# Patient Record
Sex: Female | Born: 1935
Health system: Southern US, Community
[De-identification: ages and names within clinical notes are randomized; demographics above are authoritative.]

## PROBLEM LIST (undated history)

## (undated) DIAGNOSIS — R011 Cardiac murmur, unspecified: Secondary | ICD-10-CM

## (undated) DIAGNOSIS — I714 Abdominal aortic aneurysm, without rupture, unspecified: Secondary | ICD-10-CM

## (undated) DIAGNOSIS — H6980 Other specified disorders of Eustachian tube, unspecified ear: Secondary | ICD-10-CM

## (undated) DIAGNOSIS — Z860100 Personal history of colon polyps, unspecified: Secondary | ICD-10-CM

## (undated) DIAGNOSIS — I712 Thoracic aortic aneurysm, without rupture: Secondary | ICD-10-CM

## (undated) DIAGNOSIS — I482 Chronic atrial fibrillation, unspecified: Secondary | ICD-10-CM

## (undated) DIAGNOSIS — Z5189 Encounter for other specified aftercare: Secondary | ICD-10-CM

## (undated) DIAGNOSIS — K449 Diaphragmatic hernia without obstruction or gangrene: Secondary | ICD-10-CM

## (undated) DIAGNOSIS — K219 Gastro-esophageal reflux disease without esophagitis: Secondary | ICD-10-CM

## (undated) DIAGNOSIS — J449 Chronic obstructive pulmonary disease, unspecified: Secondary | ICD-10-CM

## (undated) DIAGNOSIS — I351 Nonrheumatic aortic (valve) insufficiency: Secondary | ICD-10-CM

## (undated) DIAGNOSIS — I7121 Aneurysm of the ascending aorta, without rupture: Secondary | ICD-10-CM

## (undated) DIAGNOSIS — I729 Aneurysm of unspecified site: Secondary | ICD-10-CM

## (undated) DIAGNOSIS — IMO0001 Reserved for inherently not codable concepts without codable children: Secondary | ICD-10-CM

## (undated) DIAGNOSIS — I071 Rheumatic tricuspid insufficiency: Secondary | ICD-10-CM

## (undated) DIAGNOSIS — I5032 Chronic diastolic (congestive) heart failure: Secondary | ICD-10-CM

## (undated) DIAGNOSIS — Z950 Presence of cardiac pacemaker: Secondary | ICD-10-CM

## (undated) DIAGNOSIS — I7101 Dissection of thoracic aorta: Secondary | ICD-10-CM

## (undated) DIAGNOSIS — I442 Atrioventricular block, complete: Secondary | ICD-10-CM

## (undated) DIAGNOSIS — I350 Nonrheumatic aortic (valve) stenosis: Secondary | ICD-10-CM

## (undated) DIAGNOSIS — E785 Hyperlipidemia, unspecified: Secondary | ICD-10-CM

## (undated) DIAGNOSIS — I10 Essential (primary) hypertension: Secondary | ICD-10-CM

## (undated) DIAGNOSIS — S069X9A Unspecified intracranial injury with loss of consciousness of unspecified duration, initial encounter: Secondary | ICD-10-CM

## (undated) DIAGNOSIS — I4819 Other persistent atrial fibrillation: Secondary | ICD-10-CM

## (undated) DIAGNOSIS — I314 Cardiac tamponade: Secondary | ICD-10-CM

## (undated) DIAGNOSIS — C801 Malignant (primary) neoplasm, unspecified: Secondary | ICD-10-CM

## (undated) DIAGNOSIS — K5730 Diverticulosis of large intestine without perforation or abscess without bleeding: Secondary | ICD-10-CM

## (undated) DIAGNOSIS — I773 Arterial fibromuscular dysplasia: Secondary | ICD-10-CM

## (undated) DIAGNOSIS — H6990 Unspecified Eustachian tube disorder, unspecified ear: Secondary | ICD-10-CM

## (undated) DIAGNOSIS — I495 Sick sinus syndrome: Secondary | ICD-10-CM

## (undated) DIAGNOSIS — M1990 Unspecified osteoarthritis, unspecified site: Secondary | ICD-10-CM

## (undated) HISTORY — DX: Arterial fibromuscular dysplasia: I77.3

## (undated) HISTORY — PX: EYE SURGERY: SHX253

## (undated) HISTORY — DX: Essential (primary) hypertension: I10

## (undated) HISTORY — DX: Other specified disorders of Eustachian tube, unspecified ear: H69.80

## (undated) HISTORY — DX: Diverticulosis of large intestine without perforation or abscess without bleeding: K57.30

## (undated) HISTORY — DX: Personal history of colonic polyps: Z86.010

## (undated) HISTORY — DX: Other persistent atrial fibrillation: I48.19

## (undated) HISTORY — DX: Unspecified eustachian tube disorder, unspecified ear: H69.90

## (undated) HISTORY — PX: HEMORROIDECTOMY: SUR656

## (undated) HISTORY — PX: CHOLECYSTECTOMY: SHX55

## (undated) HISTORY — DX: Hyperlipidemia, unspecified: E78.5

## (undated) HISTORY — PX: CARDIAC CATHETERIZATION: SHX172

## (undated) HISTORY — PX: KNEE ARTHROPLASTY: SHX992

## (undated) HISTORY — PX: FRACTURE SURGERY: SHX138

## (undated) HISTORY — PX: CARDIAC ELECTROPHYSIOLOGY MAPPING AND ABLATION: SHX1292

## (undated) HISTORY — PX: CATARACT EXTRACTION W/ INTRAOCULAR LENS  IMPLANT, BILATERAL: SHX1307

## (undated) HISTORY — DX: Aneurysm of unspecified site: I72.9

## (undated) HISTORY — PX: TONSILLECTOMY: SUR1361

## (undated) HISTORY — DX: Personal history of colon polyps, unspecified: Z86.0100

## (undated) HISTORY — DX: Chronic obstructive pulmonary disease, unspecified: J44.9

## (undated) HISTORY — DX: Atrioventricular block, complete: I44.2

---

## 1985-05-13 DIAGNOSIS — S069X9A Unspecified intracranial injury with loss of consciousness of unspecified duration, initial encounter: Secondary | ICD-10-CM

## 1985-05-13 HISTORY — DX: Unspecified intracranial injury with loss of consciousness of unspecified duration, initial encounter: S06.9X9A

## 1996-05-13 HISTORY — PX: EXTERNAL FIXATION WRIST FRACTURE: SHX1553

## 1998-04-18 ENCOUNTER — Other Ambulatory Visit: Admission: RE | Admit: 1998-04-18 | Discharge: 1998-04-18 | Payer: Self-pay | Admitting: Obstetrics and Gynecology

## 1998-12-13 ENCOUNTER — Encounter: Admission: RE | Admit: 1998-12-13 | Discharge: 1999-01-31 | Payer: Self-pay | Admitting: Family Medicine

## 1999-01-17 ENCOUNTER — Encounter: Payer: Self-pay | Admitting: Emergency Medicine

## 1999-01-17 ENCOUNTER — Inpatient Hospital Stay (HOSPITAL_COMMUNITY): Admission: EM | Admit: 1999-01-17 | Discharge: 1999-01-19 | Payer: Self-pay | Admitting: Emergency Medicine

## 1999-01-19 ENCOUNTER — Encounter: Payer: Self-pay | Admitting: Cardiology

## 1999-06-13 ENCOUNTER — Encounter: Payer: Self-pay | Admitting: Emergency Medicine

## 1999-06-13 ENCOUNTER — Inpatient Hospital Stay (HOSPITAL_COMMUNITY): Admission: EM | Admit: 1999-06-13 | Discharge: 1999-06-15 | Payer: Self-pay | Admitting: Emergency Medicine

## 1999-10-23 ENCOUNTER — Other Ambulatory Visit: Admission: RE | Admit: 1999-10-23 | Discharge: 1999-10-23 | Payer: Self-pay | Admitting: *Deleted

## 1999-11-04 ENCOUNTER — Encounter: Payer: Self-pay | Admitting: Emergency Medicine

## 1999-11-04 ENCOUNTER — Emergency Department (HOSPITAL_COMMUNITY): Admission: EM | Admit: 1999-11-04 | Discharge: 1999-11-04 | Payer: Self-pay | Admitting: Emergency Medicine

## 1999-11-16 ENCOUNTER — Ambulatory Visit (HOSPITAL_COMMUNITY): Admission: RE | Admit: 1999-11-16 | Discharge: 1999-11-16 | Payer: Self-pay | Admitting: Cardiovascular Disease

## 2000-03-02 ENCOUNTER — Inpatient Hospital Stay (HOSPITAL_COMMUNITY): Admission: EM | Admit: 2000-03-02 | Discharge: 2000-03-04 | Payer: Self-pay | Admitting: Emergency Medicine

## 2000-03-02 ENCOUNTER — Encounter: Payer: Self-pay | Admitting: Cardiovascular Disease

## 2000-06-09 ENCOUNTER — Inpatient Hospital Stay (HOSPITAL_COMMUNITY): Admission: AD | Admit: 2000-06-09 | Discharge: 2000-06-16 | Payer: Self-pay | Admitting: Cardiovascular Disease

## 2000-11-11 ENCOUNTER — Other Ambulatory Visit: Admission: RE | Admit: 2000-11-11 | Discharge: 2000-11-11 | Payer: Self-pay | Admitting: *Deleted

## 2000-11-20 ENCOUNTER — Other Ambulatory Visit: Admission: RE | Admit: 2000-11-20 | Discharge: 2000-11-20 | Payer: Self-pay | Admitting: *Deleted

## 2000-11-20 ENCOUNTER — Encounter (INDEPENDENT_AMBULATORY_CARE_PROVIDER_SITE_OTHER): Payer: Self-pay | Admitting: *Deleted

## 2002-08-12 ENCOUNTER — Encounter: Payer: Self-pay | Admitting: Thoracic Surgery

## 2002-08-12 ENCOUNTER — Encounter: Admission: RE | Admit: 2002-08-12 | Discharge: 2002-08-12 | Payer: Self-pay | Admitting: Thoracic Surgery

## 2002-08-19 ENCOUNTER — Encounter: Payer: Self-pay | Admitting: Thoracic Surgery

## 2002-08-19 ENCOUNTER — Encounter: Admission: RE | Admit: 2002-08-19 | Discharge: 2002-08-19 | Payer: Self-pay | Admitting: Thoracic Surgery

## 2003-03-31 ENCOUNTER — Inpatient Hospital Stay (HOSPITAL_COMMUNITY): Admission: EM | Admit: 2003-03-31 | Discharge: 2003-04-06 | Payer: Self-pay | Admitting: Emergency Medicine

## 2004-01-27 ENCOUNTER — Ambulatory Visit (HOSPITAL_COMMUNITY): Admission: RE | Admit: 2004-01-27 | Discharge: 2004-01-27 | Payer: Self-pay | Admitting: Gastroenterology

## 2004-05-16 ENCOUNTER — Ambulatory Visit: Payer: Self-pay | Admitting: Internal Medicine

## 2004-05-23 ENCOUNTER — Ambulatory Visit: Payer: Self-pay | Admitting: Internal Medicine

## 2004-05-30 ENCOUNTER — Ambulatory Visit: Payer: Self-pay | Admitting: Internal Medicine

## 2004-06-07 ENCOUNTER — Ambulatory Visit: Payer: Self-pay | Admitting: Internal Medicine

## 2004-06-14 ENCOUNTER — Ambulatory Visit: Payer: Self-pay | Admitting: Internal Medicine

## 2004-06-21 ENCOUNTER — Ambulatory Visit: Payer: Self-pay | Admitting: Internal Medicine

## 2004-06-27 ENCOUNTER — Ambulatory Visit: Payer: Self-pay | Admitting: Internal Medicine

## 2004-07-04 ENCOUNTER — Ambulatory Visit: Payer: Self-pay | Admitting: Internal Medicine

## 2004-07-11 ENCOUNTER — Ambulatory Visit: Payer: Self-pay | Admitting: Internal Medicine

## 2004-07-18 ENCOUNTER — Ambulatory Visit: Payer: Self-pay | Admitting: Internal Medicine

## 2004-07-26 ENCOUNTER — Ambulatory Visit: Payer: Self-pay | Admitting: Internal Medicine

## 2004-08-02 ENCOUNTER — Ambulatory Visit: Payer: Self-pay | Admitting: Internal Medicine

## 2004-08-08 ENCOUNTER — Ambulatory Visit: Payer: Self-pay | Admitting: Internal Medicine

## 2004-08-12 ENCOUNTER — Emergency Department (HOSPITAL_COMMUNITY): Admission: EM | Admit: 2004-08-12 | Discharge: 2004-08-12 | Payer: Self-pay | Admitting: Emergency Medicine

## 2004-08-15 ENCOUNTER — Ambulatory Visit: Payer: Self-pay | Admitting: Internal Medicine

## 2004-08-23 ENCOUNTER — Ambulatory Visit: Payer: Self-pay | Admitting: Internal Medicine

## 2004-08-29 ENCOUNTER — Ambulatory Visit: Payer: Self-pay | Admitting: Internal Medicine

## 2004-09-11 ENCOUNTER — Ambulatory Visit: Payer: Self-pay | Admitting: Internal Medicine

## 2004-09-20 ENCOUNTER — Ambulatory Visit: Payer: Self-pay | Admitting: Internal Medicine

## 2004-09-26 ENCOUNTER — Ambulatory Visit: Payer: Self-pay | Admitting: Internal Medicine

## 2004-10-03 ENCOUNTER — Ambulatory Visit: Payer: Self-pay | Admitting: Internal Medicine

## 2004-10-12 ENCOUNTER — Ambulatory Visit: Payer: Self-pay | Admitting: Internal Medicine

## 2004-10-17 ENCOUNTER — Ambulatory Visit: Payer: Self-pay | Admitting: Internal Medicine

## 2004-10-23 ENCOUNTER — Ambulatory Visit: Payer: Self-pay | Admitting: Internal Medicine

## 2004-11-01 ENCOUNTER — Ambulatory Visit: Payer: Self-pay | Admitting: Internal Medicine

## 2004-11-08 ENCOUNTER — Ambulatory Visit: Payer: Self-pay | Admitting: Internal Medicine

## 2004-11-14 ENCOUNTER — Ambulatory Visit: Payer: Self-pay | Admitting: Internal Medicine

## 2004-11-21 ENCOUNTER — Ambulatory Visit: Payer: Self-pay | Admitting: Internal Medicine

## 2004-11-29 ENCOUNTER — Ambulatory Visit: Payer: Self-pay | Admitting: Internal Medicine

## 2004-11-30 ENCOUNTER — Ambulatory Visit: Payer: Self-pay | Admitting: Internal Medicine

## 2004-12-13 ENCOUNTER — Ambulatory Visit: Payer: Self-pay | Admitting: Internal Medicine

## 2004-12-14 ENCOUNTER — Ambulatory Visit: Payer: Self-pay | Admitting: Internal Medicine

## 2004-12-26 ENCOUNTER — Ambulatory Visit: Payer: Self-pay | Admitting: Internal Medicine

## 2005-01-03 ENCOUNTER — Ambulatory Visit: Payer: Self-pay | Admitting: Internal Medicine

## 2005-01-15 ENCOUNTER — Ambulatory Visit: Payer: Self-pay | Admitting: Internal Medicine

## 2005-01-24 ENCOUNTER — Ambulatory Visit: Payer: Self-pay | Admitting: Internal Medicine

## 2005-02-06 ENCOUNTER — Ambulatory Visit: Payer: Self-pay | Admitting: Internal Medicine

## 2005-02-12 ENCOUNTER — Ambulatory Visit: Payer: Self-pay | Admitting: Internal Medicine

## 2005-02-19 ENCOUNTER — Ambulatory Visit: Payer: Self-pay | Admitting: Internal Medicine

## 2005-02-27 ENCOUNTER — Ambulatory Visit: Payer: Self-pay | Admitting: Internal Medicine

## 2005-03-05 ENCOUNTER — Ambulatory Visit: Payer: Self-pay | Admitting: Internal Medicine

## 2005-03-13 ENCOUNTER — Ambulatory Visit: Payer: Self-pay | Admitting: Internal Medicine

## 2005-03-20 ENCOUNTER — Ambulatory Visit: Payer: Self-pay | Admitting: Internal Medicine

## 2005-03-28 ENCOUNTER — Ambulatory Visit: Payer: Self-pay | Admitting: Internal Medicine

## 2005-04-03 ENCOUNTER — Ambulatory Visit: Payer: Self-pay | Admitting: Internal Medicine

## 2005-04-10 ENCOUNTER — Ambulatory Visit: Payer: Self-pay | Admitting: Internal Medicine

## 2005-04-25 ENCOUNTER — Ambulatory Visit: Payer: Self-pay | Admitting: Internal Medicine

## 2005-04-26 ENCOUNTER — Ambulatory Visit: Payer: Self-pay | Admitting: Internal Medicine

## 2005-05-01 ENCOUNTER — Ambulatory Visit: Payer: Self-pay | Admitting: Internal Medicine

## 2005-05-09 ENCOUNTER — Ambulatory Visit: Payer: Self-pay | Admitting: Internal Medicine

## 2005-05-15 ENCOUNTER — Ambulatory Visit: Payer: Self-pay | Admitting: Internal Medicine

## 2005-05-22 ENCOUNTER — Ambulatory Visit: Payer: Self-pay | Admitting: Internal Medicine

## 2005-06-05 ENCOUNTER — Ambulatory Visit: Payer: Self-pay | Admitting: Internal Medicine

## 2005-06-12 ENCOUNTER — Ambulatory Visit: Payer: Self-pay | Admitting: Internal Medicine

## 2005-06-26 ENCOUNTER — Ambulatory Visit: Payer: Self-pay | Admitting: Internal Medicine

## 2005-07-03 ENCOUNTER — Ambulatory Visit: Payer: Self-pay | Admitting: Internal Medicine

## 2005-07-10 ENCOUNTER — Ambulatory Visit: Payer: Self-pay | Admitting: Internal Medicine

## 2005-07-19 ENCOUNTER — Ambulatory Visit: Payer: Self-pay | Admitting: Internal Medicine

## 2005-07-25 ENCOUNTER — Ambulatory Visit: Payer: Self-pay | Admitting: Internal Medicine

## 2005-08-02 ENCOUNTER — Ambulatory Visit: Payer: Self-pay | Admitting: Internal Medicine

## 2005-08-08 ENCOUNTER — Ambulatory Visit: Payer: Self-pay | Admitting: Internal Medicine

## 2005-08-15 ENCOUNTER — Ambulatory Visit: Payer: Self-pay | Admitting: Internal Medicine

## 2005-08-22 ENCOUNTER — Ambulatory Visit: Payer: Self-pay | Admitting: Internal Medicine

## 2005-08-28 ENCOUNTER — Ambulatory Visit: Payer: Self-pay | Admitting: Internal Medicine

## 2005-09-04 ENCOUNTER — Ambulatory Visit: Payer: Self-pay | Admitting: Internal Medicine

## 2005-09-09 ENCOUNTER — Ambulatory Visit: Payer: Self-pay | Admitting: Internal Medicine

## 2005-09-12 ENCOUNTER — Ambulatory Visit: Payer: Self-pay | Admitting: Internal Medicine

## 2005-09-18 ENCOUNTER — Ambulatory Visit: Payer: Self-pay | Admitting: Internal Medicine

## 2005-09-25 ENCOUNTER — Ambulatory Visit: Payer: Self-pay | Admitting: Internal Medicine

## 2005-10-02 ENCOUNTER — Ambulatory Visit: Payer: Self-pay | Admitting: Internal Medicine

## 2005-10-10 ENCOUNTER — Ambulatory Visit: Payer: Self-pay | Admitting: Internal Medicine

## 2005-10-17 ENCOUNTER — Ambulatory Visit: Payer: Self-pay | Admitting: Internal Medicine

## 2005-10-23 ENCOUNTER — Ambulatory Visit: Payer: Self-pay | Admitting: Internal Medicine

## 2005-10-31 ENCOUNTER — Ambulatory Visit: Payer: Self-pay | Admitting: Internal Medicine

## 2005-11-06 ENCOUNTER — Ambulatory Visit: Payer: Self-pay | Admitting: Internal Medicine

## 2005-11-14 ENCOUNTER — Ambulatory Visit: Payer: Self-pay | Admitting: Internal Medicine

## 2005-11-20 ENCOUNTER — Ambulatory Visit: Payer: Self-pay | Admitting: Internal Medicine

## 2005-11-26 ENCOUNTER — Ambulatory Visit: Payer: Self-pay | Admitting: Internal Medicine

## 2005-12-04 ENCOUNTER — Ambulatory Visit: Payer: Self-pay | Admitting: Internal Medicine

## 2005-12-11 ENCOUNTER — Ambulatory Visit: Payer: Self-pay | Admitting: Internal Medicine

## 2005-12-26 ENCOUNTER — Ambulatory Visit: Payer: Self-pay | Admitting: Internal Medicine

## 2006-01-02 ENCOUNTER — Ambulatory Visit: Payer: Self-pay | Admitting: Internal Medicine

## 2006-01-07 ENCOUNTER — Ambulatory Visit: Payer: Self-pay | Admitting: Internal Medicine

## 2006-01-08 ENCOUNTER — Inpatient Hospital Stay (HOSPITAL_COMMUNITY): Admission: EM | Admit: 2006-01-08 | Discharge: 2006-01-13 | Payer: Self-pay | Admitting: Emergency Medicine

## 2006-01-09 ENCOUNTER — Ambulatory Visit: Payer: Self-pay | Admitting: Internal Medicine

## 2006-01-10 ENCOUNTER — Encounter: Payer: Self-pay | Admitting: Internal Medicine

## 2006-01-14 ENCOUNTER — Ambulatory Visit: Payer: Self-pay | Admitting: Internal Medicine

## 2006-01-30 ENCOUNTER — Ambulatory Visit: Payer: Self-pay | Admitting: Internal Medicine

## 2006-01-31 ENCOUNTER — Ambulatory Visit: Payer: Self-pay | Admitting: Gastroenterology

## 2006-02-11 ENCOUNTER — Ambulatory Visit: Payer: Self-pay | Admitting: Internal Medicine

## 2006-02-15 ENCOUNTER — Emergency Department (HOSPITAL_COMMUNITY): Admission: EM | Admit: 2006-02-15 | Discharge: 2006-02-15 | Payer: Self-pay | Admitting: Emergency Medicine

## 2006-02-18 ENCOUNTER — Ambulatory Visit: Payer: Self-pay | Admitting: Internal Medicine

## 2006-02-21 ENCOUNTER — Ambulatory Visit (HOSPITAL_BASED_OUTPATIENT_CLINIC_OR_DEPARTMENT_OTHER): Admission: RE | Admit: 2006-02-21 | Discharge: 2006-02-21 | Payer: Self-pay | Admitting: Orthopaedic Surgery

## 2006-03-12 ENCOUNTER — Ambulatory Visit: Payer: Self-pay | Admitting: Internal Medicine

## 2006-03-18 ENCOUNTER — Ambulatory Visit: Payer: Self-pay | Admitting: Internal Medicine

## 2006-04-01 ENCOUNTER — Ambulatory Visit: Payer: Self-pay | Admitting: Internal Medicine

## 2006-04-09 ENCOUNTER — Ambulatory Visit: Payer: Self-pay | Admitting: Internal Medicine

## 2006-04-16 ENCOUNTER — Ambulatory Visit: Payer: Self-pay | Admitting: Internal Medicine

## 2006-04-23 ENCOUNTER — Ambulatory Visit: Payer: Self-pay | Admitting: Internal Medicine

## 2006-04-30 ENCOUNTER — Ambulatory Visit: Payer: Self-pay | Admitting: Internal Medicine

## 2006-05-08 ENCOUNTER — Ambulatory Visit: Payer: Self-pay | Admitting: Internal Medicine

## 2006-05-14 ENCOUNTER — Ambulatory Visit: Payer: Self-pay | Admitting: Internal Medicine

## 2006-05-21 ENCOUNTER — Ambulatory Visit: Payer: Self-pay | Admitting: Internal Medicine

## 2006-05-28 ENCOUNTER — Ambulatory Visit: Payer: Self-pay | Admitting: Internal Medicine

## 2006-06-05 ENCOUNTER — Ambulatory Visit: Payer: Self-pay | Admitting: Internal Medicine

## 2006-06-11 ENCOUNTER — Ambulatory Visit: Payer: Self-pay | Admitting: Internal Medicine

## 2006-06-19 ENCOUNTER — Ambulatory Visit: Payer: Self-pay | Admitting: Internal Medicine

## 2006-06-23 ENCOUNTER — Ambulatory Visit: Payer: Self-pay | Admitting: Gastroenterology

## 2006-06-23 ENCOUNTER — Ambulatory Visit: Payer: Self-pay | Admitting: Internal Medicine

## 2006-07-03 ENCOUNTER — Ambulatory Visit: Payer: Self-pay | Admitting: Internal Medicine

## 2006-07-03 ENCOUNTER — Encounter (INDEPENDENT_AMBULATORY_CARE_PROVIDER_SITE_OTHER): Payer: Self-pay | Admitting: *Deleted

## 2006-07-03 ENCOUNTER — Ambulatory Visit: Payer: Self-pay | Admitting: Gastroenterology

## 2006-07-03 DIAGNOSIS — Z860100 Personal history of colon polyps, unspecified: Secondary | ICD-10-CM | POA: Insufficient documentation

## 2006-07-03 DIAGNOSIS — K5730 Diverticulosis of large intestine without perforation or abscess without bleeding: Secondary | ICD-10-CM | POA: Insufficient documentation

## 2006-07-04 ENCOUNTER — Ambulatory Visit: Payer: Self-pay | Admitting: Internal Medicine

## 2006-07-17 ENCOUNTER — Ambulatory Visit: Payer: Self-pay | Admitting: Internal Medicine

## 2006-07-24 ENCOUNTER — Ambulatory Visit: Payer: Self-pay | Admitting: Internal Medicine

## 2006-07-29 ENCOUNTER — Ambulatory Visit: Payer: Self-pay | Admitting: Internal Medicine

## 2006-08-06 ENCOUNTER — Ambulatory Visit: Payer: Self-pay | Admitting: Internal Medicine

## 2006-08-14 ENCOUNTER — Ambulatory Visit: Payer: Self-pay | Admitting: Internal Medicine

## 2006-08-19 ENCOUNTER — Ambulatory Visit: Payer: Self-pay | Admitting: Internal Medicine

## 2006-08-26 ENCOUNTER — Ambulatory Visit: Payer: Self-pay | Admitting: Internal Medicine

## 2006-09-10 ENCOUNTER — Ambulatory Visit: Payer: Self-pay | Admitting: Internal Medicine

## 2006-09-17 ENCOUNTER — Ambulatory Visit: Payer: Self-pay | Admitting: Internal Medicine

## 2006-10-01 ENCOUNTER — Ambulatory Visit: Payer: Self-pay | Admitting: Internal Medicine

## 2006-10-08 ENCOUNTER — Ambulatory Visit: Payer: Self-pay | Admitting: Internal Medicine

## 2006-10-15 ENCOUNTER — Ambulatory Visit: Payer: Self-pay | Admitting: Internal Medicine

## 2006-10-23 ENCOUNTER — Ambulatory Visit: Payer: Self-pay | Admitting: Internal Medicine

## 2006-10-29 ENCOUNTER — Ambulatory Visit: Payer: Self-pay | Admitting: Internal Medicine

## 2006-11-05 ENCOUNTER — Ambulatory Visit: Payer: Self-pay | Admitting: Internal Medicine

## 2006-11-12 ENCOUNTER — Ambulatory Visit: Payer: Self-pay | Admitting: Internal Medicine

## 2006-11-19 ENCOUNTER — Ambulatory Visit: Payer: Self-pay | Admitting: Internal Medicine

## 2006-11-26 ENCOUNTER — Ambulatory Visit: Payer: Self-pay | Admitting: Internal Medicine

## 2006-12-03 ENCOUNTER — Ambulatory Visit: Payer: Self-pay | Admitting: Internal Medicine

## 2006-12-04 ENCOUNTER — Ambulatory Visit: Payer: Self-pay | Admitting: Internal Medicine

## 2006-12-10 ENCOUNTER — Ambulatory Visit: Payer: Self-pay | Admitting: Internal Medicine

## 2006-12-16 ENCOUNTER — Ambulatory Visit: Payer: Self-pay | Admitting: Internal Medicine

## 2006-12-23 ENCOUNTER — Ambulatory Visit: Payer: Self-pay | Admitting: Internal Medicine

## 2006-12-31 ENCOUNTER — Ambulatory Visit: Payer: Self-pay | Admitting: Internal Medicine

## 2007-01-09 ENCOUNTER — Ambulatory Visit: Payer: Self-pay | Admitting: Internal Medicine

## 2007-01-14 ENCOUNTER — Ambulatory Visit: Payer: Self-pay | Admitting: Internal Medicine

## 2007-01-21 ENCOUNTER — Ambulatory Visit: Payer: Self-pay | Admitting: Internal Medicine

## 2007-01-28 ENCOUNTER — Ambulatory Visit: Payer: Self-pay | Admitting: Internal Medicine

## 2007-02-02 ENCOUNTER — Ambulatory Visit: Payer: Self-pay | Admitting: Internal Medicine

## 2007-02-11 ENCOUNTER — Ambulatory Visit: Payer: Self-pay | Admitting: Internal Medicine

## 2007-02-18 ENCOUNTER — Ambulatory Visit: Payer: Self-pay | Admitting: Internal Medicine

## 2007-02-25 ENCOUNTER — Ambulatory Visit: Payer: Self-pay | Admitting: Internal Medicine

## 2007-03-03 ENCOUNTER — Ambulatory Visit: Payer: Self-pay | Admitting: Internal Medicine

## 2007-03-11 ENCOUNTER — Ambulatory Visit: Payer: Self-pay | Admitting: Internal Medicine

## 2007-03-18 ENCOUNTER — Ambulatory Visit: Payer: Self-pay | Admitting: Internal Medicine

## 2007-03-25 ENCOUNTER — Ambulatory Visit: Payer: Self-pay | Admitting: Internal Medicine

## 2007-04-01 ENCOUNTER — Ambulatory Visit: Payer: Self-pay | Admitting: Internal Medicine

## 2007-04-08 ENCOUNTER — Ambulatory Visit: Payer: Self-pay | Admitting: Internal Medicine

## 2007-04-14 ENCOUNTER — Ambulatory Visit: Payer: Self-pay | Admitting: Internal Medicine

## 2007-04-15 ENCOUNTER — Ambulatory Visit: Payer: Self-pay | Admitting: Internal Medicine

## 2007-04-21 ENCOUNTER — Ambulatory Visit: Payer: Self-pay | Admitting: Internal Medicine

## 2007-04-28 ENCOUNTER — Ambulatory Visit: Payer: Self-pay | Admitting: Internal Medicine

## 2007-05-05 ENCOUNTER — Ambulatory Visit: Payer: Self-pay | Admitting: Internal Medicine

## 2007-05-13 ENCOUNTER — Ambulatory Visit: Payer: Self-pay | Admitting: Internal Medicine

## 2007-05-20 ENCOUNTER — Ambulatory Visit: Payer: Self-pay | Admitting: Internal Medicine

## 2007-05-26 ENCOUNTER — Ambulatory Visit: Payer: Self-pay | Admitting: Internal Medicine

## 2007-06-03 ENCOUNTER — Ambulatory Visit: Payer: Self-pay | Admitting: Internal Medicine

## 2007-06-09 ENCOUNTER — Ambulatory Visit: Payer: Self-pay | Admitting: Internal Medicine

## 2007-06-24 ENCOUNTER — Ambulatory Visit: Payer: Self-pay | Admitting: Internal Medicine

## 2007-07-02 ENCOUNTER — Ambulatory Visit: Payer: Self-pay | Admitting: Internal Medicine

## 2007-07-08 ENCOUNTER — Ambulatory Visit: Payer: Self-pay | Admitting: Internal Medicine

## 2007-07-14 ENCOUNTER — Ambulatory Visit: Payer: Self-pay | Admitting: Internal Medicine

## 2007-07-22 ENCOUNTER — Ambulatory Visit: Payer: Self-pay | Admitting: Internal Medicine

## 2007-07-29 ENCOUNTER — Ambulatory Visit: Payer: Self-pay | Admitting: Internal Medicine

## 2007-08-05 ENCOUNTER — Ambulatory Visit: Payer: Self-pay | Admitting: Internal Medicine

## 2007-08-12 ENCOUNTER — Ambulatory Visit: Payer: Self-pay | Admitting: Internal Medicine

## 2007-08-19 ENCOUNTER — Ambulatory Visit: Payer: Self-pay | Admitting: Internal Medicine

## 2007-08-26 ENCOUNTER — Ambulatory Visit: Payer: Self-pay | Admitting: Internal Medicine

## 2007-08-27 ENCOUNTER — Ambulatory Visit: Payer: Self-pay | Admitting: Internal Medicine

## 2007-09-02 ENCOUNTER — Ambulatory Visit: Payer: Self-pay | Admitting: Internal Medicine

## 2007-09-09 ENCOUNTER — Ambulatory Visit: Payer: Self-pay | Admitting: Internal Medicine

## 2007-09-25 ENCOUNTER — Ambulatory Visit: Payer: Self-pay | Admitting: Internal Medicine

## 2007-10-02 ENCOUNTER — Ambulatory Visit: Payer: Self-pay | Admitting: Internal Medicine

## 2007-10-07 ENCOUNTER — Ambulatory Visit: Payer: Self-pay | Admitting: Internal Medicine

## 2007-10-13 ENCOUNTER — Ambulatory Visit: Payer: Self-pay | Admitting: Internal Medicine

## 2007-10-20 ENCOUNTER — Ambulatory Visit: Payer: Self-pay | Admitting: Internal Medicine

## 2007-10-28 ENCOUNTER — Ambulatory Visit: Payer: Self-pay | Admitting: Internal Medicine

## 2007-11-05 ENCOUNTER — Ambulatory Visit: Payer: Self-pay | Admitting: Internal Medicine

## 2007-11-12 ENCOUNTER — Ambulatory Visit: Payer: Self-pay | Admitting: Internal Medicine

## 2007-11-17 ENCOUNTER — Ambulatory Visit: Payer: Self-pay | Admitting: Internal Medicine

## 2007-11-25 ENCOUNTER — Ambulatory Visit: Payer: Self-pay | Admitting: Internal Medicine

## 2007-11-26 DIAGNOSIS — I4819 Other persistent atrial fibrillation: Secondary | ICD-10-CM | POA: Insufficient documentation

## 2007-11-26 DIAGNOSIS — J309 Allergic rhinitis, unspecified: Secondary | ICD-10-CM | POA: Insufficient documentation

## 2007-11-26 DIAGNOSIS — H6980 Other specified disorders of Eustachian tube, unspecified ear: Secondary | ICD-10-CM

## 2007-11-27 ENCOUNTER — Ambulatory Visit: Payer: Self-pay | Admitting: Internal Medicine

## 2007-12-11 ENCOUNTER — Ambulatory Visit: Payer: Self-pay | Admitting: Internal Medicine

## 2007-12-15 ENCOUNTER — Ambulatory Visit: Payer: Self-pay | Admitting: Internal Medicine

## 2007-12-23 ENCOUNTER — Ambulatory Visit: Payer: Self-pay | Admitting: Internal Medicine

## 2007-12-28 ENCOUNTER — Ambulatory Visit: Payer: Self-pay | Admitting: Internal Medicine

## 2008-01-01 ENCOUNTER — Ambulatory Visit: Payer: Self-pay | Admitting: Internal Medicine

## 2008-01-04 ENCOUNTER — Ambulatory Visit: Payer: Self-pay | Admitting: Internal Medicine

## 2008-01-06 ENCOUNTER — Ambulatory Visit: Payer: Self-pay | Admitting: Internal Medicine

## 2008-01-14 ENCOUNTER — Ambulatory Visit: Payer: Self-pay | Admitting: Internal Medicine

## 2008-01-20 ENCOUNTER — Ambulatory Visit: Payer: Self-pay | Admitting: Internal Medicine

## 2008-02-03 ENCOUNTER — Ambulatory Visit: Payer: Self-pay | Admitting: Internal Medicine

## 2008-02-10 ENCOUNTER — Ambulatory Visit: Payer: Self-pay | Admitting: Internal Medicine

## 2008-02-16 ENCOUNTER — Ambulatory Visit: Payer: Self-pay | Admitting: Internal Medicine

## 2008-02-23 ENCOUNTER — Ambulatory Visit: Payer: Self-pay | Admitting: Internal Medicine

## 2008-03-01 ENCOUNTER — Ambulatory Visit: Payer: Self-pay | Admitting: Internal Medicine

## 2008-03-09 ENCOUNTER — Ambulatory Visit: Payer: Self-pay | Admitting: Internal Medicine

## 2008-03-17 ENCOUNTER — Ambulatory Visit: Payer: Self-pay | Admitting: Internal Medicine

## 2008-03-18 ENCOUNTER — Inpatient Hospital Stay (HOSPITAL_COMMUNITY): Admission: EM | Admit: 2008-03-18 | Discharge: 2008-03-20 | Payer: Self-pay | Admitting: Emergency Medicine

## 2008-03-18 ENCOUNTER — Encounter: Payer: Self-pay | Admitting: Gastroenterology

## 2008-03-18 ENCOUNTER — Ambulatory Visit: Payer: Self-pay | Admitting: Internal Medicine

## 2008-03-20 ENCOUNTER — Encounter: Payer: Self-pay | Admitting: Gastroenterology

## 2008-03-21 ENCOUNTER — Telehealth: Payer: Self-pay | Admitting: Gastroenterology

## 2008-03-29 ENCOUNTER — Ambulatory Visit: Payer: Self-pay | Admitting: Gastroenterology

## 2008-03-29 LAB — CONVERTED CEMR LAB
ALT: 61 units/L — ABNORMAL HIGH (ref 0–35)
AST: 41 units/L — ABNORMAL HIGH (ref 0–37)
Alkaline Phosphatase: 199 units/L — ABNORMAL HIGH (ref 39–117)
Bilirubin, Direct: 0.4 mg/dL — ABNORMAL HIGH (ref 0.0–0.3)
Total Protein: 6.3 g/dL (ref 6.0–8.3)

## 2008-03-30 ENCOUNTER — Ambulatory Visit: Payer: Self-pay | Admitting: Gastroenterology

## 2008-03-30 DIAGNOSIS — I728 Aneurysm of other specified arteries: Secondary | ICD-10-CM | POA: Insufficient documentation

## 2008-03-30 DIAGNOSIS — R945 Abnormal results of liver function studies: Secondary | ICD-10-CM | POA: Insufficient documentation

## 2008-04-05 ENCOUNTER — Ambulatory Visit: Payer: Self-pay | Admitting: Internal Medicine

## 2008-04-12 ENCOUNTER — Ambulatory Visit: Payer: Self-pay | Admitting: Internal Medicine

## 2008-04-17 ENCOUNTER — Emergency Department (HOSPITAL_COMMUNITY): Admission: EM | Admit: 2008-04-17 | Discharge: 2008-04-17 | Payer: Self-pay | Admitting: Emergency Medicine

## 2008-04-20 ENCOUNTER — Ambulatory Visit: Payer: Self-pay | Admitting: Internal Medicine

## 2008-05-11 ENCOUNTER — Ambulatory Visit: Payer: Self-pay | Admitting: Internal Medicine

## 2008-05-18 ENCOUNTER — Ambulatory Visit: Payer: Self-pay | Admitting: Internal Medicine

## 2008-05-24 ENCOUNTER — Ambulatory Visit: Payer: Self-pay | Admitting: Internal Medicine

## 2008-05-26 ENCOUNTER — Ambulatory Visit: Payer: Self-pay | Admitting: Internal Medicine

## 2008-05-31 ENCOUNTER — Ambulatory Visit: Payer: Self-pay | Admitting: Internal Medicine

## 2008-06-20 ENCOUNTER — Observation Stay (HOSPITAL_COMMUNITY): Admission: EM | Admit: 2008-06-20 | Discharge: 2008-06-22 | Payer: Self-pay | Admitting: Primary Care

## 2008-06-27 ENCOUNTER — Ambulatory Visit: Payer: Self-pay | Admitting: Internal Medicine

## 2008-07-06 ENCOUNTER — Ambulatory Visit: Payer: Self-pay | Admitting: Internal Medicine

## 2008-07-07 ENCOUNTER — Encounter: Admission: RE | Admit: 2008-07-07 | Discharge: 2008-07-07 | Payer: Self-pay | Admitting: Internal Medicine

## 2008-07-14 ENCOUNTER — Ambulatory Visit: Payer: Self-pay | Admitting: Internal Medicine

## 2008-07-22 ENCOUNTER — Ambulatory Visit: Payer: Self-pay | Admitting: Internal Medicine

## 2008-07-28 ENCOUNTER — Ambulatory Visit: Payer: Self-pay | Admitting: Internal Medicine

## 2008-08-03 ENCOUNTER — Ambulatory Visit: Payer: Self-pay | Admitting: Internal Medicine

## 2008-08-17 ENCOUNTER — Encounter: Admission: RE | Admit: 2008-08-17 | Discharge: 2008-08-17 | Payer: Self-pay | Admitting: Urology

## 2008-08-17 ENCOUNTER — Ambulatory Visit: Payer: Self-pay | Admitting: Internal Medicine

## 2008-08-24 ENCOUNTER — Ambulatory Visit: Payer: Self-pay | Admitting: Internal Medicine

## 2008-08-31 ENCOUNTER — Ambulatory Visit: Payer: Self-pay | Admitting: Internal Medicine

## 2008-09-10 HISTORY — PX: TUMOR EXCISION: SHX421

## 2008-09-14 ENCOUNTER — Ambulatory Visit: Payer: Self-pay | Admitting: Internal Medicine

## 2008-09-21 ENCOUNTER — Ambulatory Visit: Payer: Self-pay | Admitting: Internal Medicine

## 2008-09-23 ENCOUNTER — Encounter (INDEPENDENT_AMBULATORY_CARE_PROVIDER_SITE_OTHER): Payer: Self-pay | Admitting: Interventional Radiology

## 2008-09-23 ENCOUNTER — Ambulatory Visit (HOSPITAL_COMMUNITY): Admission: RE | Admit: 2008-09-23 | Discharge: 2008-09-24 | Payer: Self-pay | Admitting: Interventional Radiology

## 2008-09-28 ENCOUNTER — Ambulatory Visit: Payer: Self-pay | Admitting: Internal Medicine

## 2008-10-06 ENCOUNTER — Ambulatory Visit: Payer: Self-pay | Admitting: Internal Medicine

## 2008-10-12 ENCOUNTER — Ambulatory Visit: Payer: Self-pay | Admitting: Internal Medicine

## 2008-10-20 ENCOUNTER — Ambulatory Visit: Payer: Self-pay | Admitting: Internal Medicine

## 2008-10-25 ENCOUNTER — Ambulatory Visit: Payer: Self-pay | Admitting: Internal Medicine

## 2008-10-26 ENCOUNTER — Ambulatory Visit: Payer: Self-pay | Admitting: Internal Medicine

## 2008-11-01 ENCOUNTER — Ambulatory Visit: Payer: Self-pay | Admitting: Internal Medicine

## 2008-11-01 ENCOUNTER — Encounter: Admission: RE | Admit: 2008-11-01 | Discharge: 2008-11-01 | Payer: Self-pay | Admitting: Urology

## 2008-11-02 ENCOUNTER — Encounter: Admission: RE | Admit: 2008-11-02 | Discharge: 2008-11-02 | Payer: Self-pay | Admitting: Internal Medicine

## 2008-11-09 ENCOUNTER — Ambulatory Visit: Payer: Self-pay | Admitting: Internal Medicine

## 2008-11-17 ENCOUNTER — Ambulatory Visit: Payer: Self-pay | Admitting: Internal Medicine

## 2008-11-22 ENCOUNTER — Ambulatory Visit: Payer: Self-pay | Admitting: Internal Medicine

## 2008-11-30 ENCOUNTER — Ambulatory Visit: Payer: Self-pay | Admitting: Internal Medicine

## 2008-12-07 ENCOUNTER — Ambulatory Visit: Payer: Self-pay | Admitting: Internal Medicine

## 2008-12-14 ENCOUNTER — Ambulatory Visit: Payer: Self-pay | Admitting: Internal Medicine

## 2008-12-22 ENCOUNTER — Ambulatory Visit: Payer: Self-pay | Admitting: Internal Medicine

## 2008-12-26 ENCOUNTER — Ambulatory Visit: Payer: Self-pay | Admitting: Internal Medicine

## 2008-12-28 ENCOUNTER — Ambulatory Visit: Payer: Self-pay | Admitting: Internal Medicine

## 2009-01-04 ENCOUNTER — Ambulatory Visit: Payer: Self-pay | Admitting: Internal Medicine

## 2009-01-19 ENCOUNTER — Ambulatory Visit: Payer: Self-pay | Admitting: Internal Medicine

## 2009-01-24 ENCOUNTER — Ambulatory Visit: Payer: Self-pay | Admitting: Internal Medicine

## 2009-02-01 ENCOUNTER — Ambulatory Visit: Payer: Self-pay | Admitting: Internal Medicine

## 2009-02-09 ENCOUNTER — Ambulatory Visit: Payer: Self-pay | Admitting: Internal Medicine

## 2009-02-15 ENCOUNTER — Ambulatory Visit: Payer: Self-pay | Admitting: Internal Medicine

## 2009-02-22 ENCOUNTER — Ambulatory Visit: Payer: Self-pay | Admitting: Internal Medicine

## 2009-03-01 ENCOUNTER — Ambulatory Visit: Payer: Self-pay | Admitting: Internal Medicine

## 2009-03-02 ENCOUNTER — Ambulatory Visit: Payer: Self-pay | Admitting: Internal Medicine

## 2009-03-09 ENCOUNTER — Ambulatory Visit: Payer: Self-pay | Admitting: Internal Medicine

## 2009-03-14 ENCOUNTER — Ambulatory Visit: Payer: Self-pay | Admitting: Internal Medicine

## 2009-03-21 ENCOUNTER — Ambulatory Visit: Payer: Self-pay | Admitting: Internal Medicine

## 2009-03-29 ENCOUNTER — Ambulatory Visit: Payer: Self-pay | Admitting: Internal Medicine

## 2009-04-04 ENCOUNTER — Ambulatory Visit: Payer: Self-pay | Admitting: Internal Medicine

## 2009-04-11 ENCOUNTER — Ambulatory Visit: Payer: Self-pay | Admitting: Internal Medicine

## 2009-04-18 ENCOUNTER — Ambulatory Visit: Payer: Self-pay | Admitting: Internal Medicine

## 2009-04-26 ENCOUNTER — Ambulatory Visit: Payer: Self-pay | Admitting: Internal Medicine

## 2009-05-03 ENCOUNTER — Ambulatory Visit: Payer: Self-pay | Admitting: Internal Medicine

## 2009-05-09 ENCOUNTER — Ambulatory Visit: Payer: Self-pay | Admitting: Internal Medicine

## 2009-05-17 ENCOUNTER — Ambulatory Visit (HOSPITAL_COMMUNITY): Admission: RE | Admit: 2009-05-17 | Discharge: 2009-05-17 | Payer: Self-pay | Admitting: Interventional Radiology

## 2009-05-17 ENCOUNTER — Encounter: Admission: RE | Admit: 2009-05-17 | Discharge: 2009-05-17 | Payer: Self-pay | Admitting: Interventional Radiology

## 2009-05-24 ENCOUNTER — Ambulatory Visit: Payer: Self-pay | Admitting: Internal Medicine

## 2009-05-31 ENCOUNTER — Ambulatory Visit: Payer: Self-pay | Admitting: Internal Medicine

## 2009-06-06 ENCOUNTER — Ambulatory Visit: Payer: Self-pay | Admitting: Internal Medicine

## 2009-06-15 ENCOUNTER — Ambulatory Visit: Payer: Self-pay | Admitting: Internal Medicine

## 2009-06-22 ENCOUNTER — Ambulatory Visit: Payer: Self-pay | Admitting: Internal Medicine

## 2009-06-27 ENCOUNTER — Ambulatory Visit: Payer: Self-pay | Admitting: Internal Medicine

## 2009-07-06 ENCOUNTER — Ambulatory Visit: Payer: Self-pay | Admitting: Internal Medicine

## 2009-07-18 ENCOUNTER — Ambulatory Visit: Payer: Self-pay | Admitting: Internal Medicine

## 2009-07-25 ENCOUNTER — Ambulatory Visit: Payer: Self-pay | Admitting: Internal Medicine

## 2009-08-01 ENCOUNTER — Ambulatory Visit: Payer: Self-pay | Admitting: Internal Medicine

## 2009-08-08 ENCOUNTER — Ambulatory Visit: Payer: Self-pay | Admitting: Internal Medicine

## 2009-08-16 ENCOUNTER — Ambulatory Visit: Payer: Self-pay | Admitting: Internal Medicine

## 2009-08-24 ENCOUNTER — Ambulatory Visit: Payer: Self-pay | Admitting: Internal Medicine

## 2009-08-30 ENCOUNTER — Ambulatory Visit: Payer: Self-pay | Admitting: Internal Medicine

## 2009-09-06 ENCOUNTER — Ambulatory Visit: Payer: Self-pay | Admitting: Internal Medicine

## 2009-09-12 ENCOUNTER — Ambulatory Visit: Payer: Self-pay | Admitting: Internal Medicine

## 2009-09-20 ENCOUNTER — Ambulatory Visit: Payer: Self-pay | Admitting: Internal Medicine

## 2009-09-28 ENCOUNTER — Ambulatory Visit: Payer: Self-pay | Admitting: Internal Medicine

## 2009-10-03 ENCOUNTER — Ambulatory Visit: Payer: Self-pay | Admitting: Internal Medicine

## 2009-10-10 ENCOUNTER — Ambulatory Visit: Payer: Self-pay | Admitting: Internal Medicine

## 2009-10-18 ENCOUNTER — Inpatient Hospital Stay (HOSPITAL_COMMUNITY): Admission: EM | Admit: 2009-10-18 | Discharge: 2009-10-18 | Payer: Self-pay | Admitting: Cardiovascular Disease

## 2009-10-18 ENCOUNTER — Encounter: Payer: Self-pay | Admitting: Emergency Medicine

## 2009-10-19 ENCOUNTER — Ambulatory Visit: Payer: Self-pay | Admitting: Internal Medicine

## 2009-10-24 ENCOUNTER — Ambulatory Visit: Payer: Self-pay | Admitting: Gastroenterology

## 2009-10-24 LAB — CONVERTED CEMR LAB
AST: 120 units/L — ABNORMAL HIGH (ref 0–37)
Alkaline Phosphatase: 216 units/L — ABNORMAL HIGH (ref 39–117)
BUN: 15 mg/dL (ref 6–23)
Calcium: 9.3 mg/dL (ref 8.4–10.5)
Creatinine, Ser: 1.2 mg/dL (ref 0.4–1.2)

## 2009-10-25 ENCOUNTER — Ambulatory Visit: Payer: Self-pay | Admitting: Internal Medicine

## 2009-10-25 ENCOUNTER — Ambulatory Visit: Payer: Self-pay | Admitting: Gastroenterology

## 2009-10-25 DIAGNOSIS — R110 Nausea: Secondary | ICD-10-CM | POA: Insufficient documentation

## 2009-10-25 DIAGNOSIS — R1012 Left upper quadrant pain: Secondary | ICD-10-CM

## 2009-10-25 DIAGNOSIS — R1013 Epigastric pain: Secondary | ICD-10-CM | POA: Insufficient documentation

## 2009-10-25 DIAGNOSIS — K219 Gastro-esophageal reflux disease without esophagitis: Secondary | ICD-10-CM

## 2009-11-06 ENCOUNTER — Telehealth: Payer: Self-pay | Admitting: Gastroenterology

## 2009-11-07 ENCOUNTER — Encounter: Admission: RE | Admit: 2009-11-07 | Discharge: 2009-11-07 | Payer: Self-pay | Admitting: Interventional Radiology

## 2009-11-09 ENCOUNTER — Ambulatory Visit: Payer: Self-pay | Admitting: Internal Medicine

## 2009-11-16 ENCOUNTER — Ambulatory Visit: Payer: Self-pay | Admitting: Internal Medicine

## 2009-11-17 ENCOUNTER — Ambulatory Visit: Payer: Self-pay | Admitting: Internal Medicine

## 2009-11-23 ENCOUNTER — Ambulatory Visit: Payer: Self-pay | Admitting: Internal Medicine

## 2009-11-30 ENCOUNTER — Ambulatory Visit: Payer: Self-pay | Admitting: Internal Medicine

## 2009-12-05 ENCOUNTER — Encounter: Payer: Self-pay | Admitting: Gastroenterology

## 2009-12-08 ENCOUNTER — Ambulatory Visit: Payer: Self-pay | Admitting: Internal Medicine

## 2009-12-13 ENCOUNTER — Ambulatory Visit: Payer: Self-pay | Admitting: Internal Medicine

## 2009-12-21 ENCOUNTER — Ambulatory Visit: Payer: Self-pay | Admitting: Internal Medicine

## 2009-12-27 ENCOUNTER — Ambulatory Visit: Payer: Self-pay | Admitting: Internal Medicine

## 2010-01-01 ENCOUNTER — Ambulatory Visit: Payer: Self-pay | Admitting: Internal Medicine

## 2010-01-11 ENCOUNTER — Ambulatory Visit: Payer: Self-pay | Admitting: Internal Medicine

## 2010-01-17 ENCOUNTER — Telehealth: Payer: Self-pay | Admitting: Gastroenterology

## 2010-01-18 ENCOUNTER — Ambulatory Visit: Payer: Self-pay | Admitting: Internal Medicine

## 2010-01-19 ENCOUNTER — Ambulatory Visit: Payer: Self-pay | Admitting: Gastroenterology

## 2010-01-19 LAB — CONVERTED CEMR LAB
Basophils Relative: 0.5 % (ref 0.0–3.0)
Creatinine, Ser: 1 mg/dL (ref 0.4–1.2)
Eosinophils Absolute: 0.1 10*3/uL (ref 0.0–0.7)
Hemoglobin: 14.7 g/dL (ref 12.0–15.0)
Lymphocytes Relative: 13.6 % (ref 12.0–46.0)
MCHC: 34.6 g/dL (ref 30.0–36.0)
Neutro Abs: 7.9 10*3/uL — ABNORMAL HIGH (ref 1.4–7.7)
RBC: 4.26 M/uL (ref 3.87–5.11)
aPTT: 26.1 s (ref 21.7–28.8)

## 2010-01-24 ENCOUNTER — Ambulatory Visit: Payer: Self-pay | Admitting: Internal Medicine

## 2010-01-31 ENCOUNTER — Ambulatory Visit: Payer: Self-pay | Admitting: Internal Medicine

## 2010-02-06 ENCOUNTER — Ambulatory Visit: Payer: Self-pay | Admitting: Internal Medicine

## 2010-02-13 ENCOUNTER — Ambulatory Visit: Payer: Self-pay | Admitting: Internal Medicine

## 2010-02-14 ENCOUNTER — Ambulatory Visit: Payer: Self-pay | Admitting: Internal Medicine

## 2010-02-21 ENCOUNTER — Ambulatory Visit: Payer: Self-pay | Admitting: Internal Medicine

## 2010-02-28 ENCOUNTER — Ambulatory Visit: Payer: Self-pay | Admitting: Internal Medicine

## 2010-03-08 ENCOUNTER — Ambulatory Visit: Payer: Self-pay | Admitting: Internal Medicine

## 2010-03-14 ENCOUNTER — Ambulatory Visit: Payer: Self-pay | Admitting: Internal Medicine

## 2010-03-21 ENCOUNTER — Ambulatory Visit: Payer: Self-pay | Admitting: Internal Medicine

## 2010-03-26 ENCOUNTER — Ambulatory Visit: Payer: Self-pay | Admitting: Internal Medicine

## 2010-04-04 ENCOUNTER — Ambulatory Visit: Payer: Self-pay | Admitting: Internal Medicine

## 2010-04-10 ENCOUNTER — Ambulatory Visit: Payer: Self-pay | Admitting: Internal Medicine

## 2010-04-17 ENCOUNTER — Ambulatory Visit: Payer: Self-pay | Admitting: Internal Medicine

## 2010-04-25 ENCOUNTER — Ambulatory Visit: Payer: Self-pay | Admitting: Internal Medicine

## 2010-05-01 ENCOUNTER — Ambulatory Visit: Payer: Self-pay | Admitting: Internal Medicine

## 2010-05-15 ENCOUNTER — Ambulatory Visit: Payer: Self-pay | Admitting: Internal Medicine

## 2010-05-28 ENCOUNTER — Ambulatory Visit: Payer: Self-pay | Admitting: Internal Medicine

## 2010-05-31 ENCOUNTER — Ambulatory Visit: Payer: Self-pay | Admitting: Internal Medicine

## 2010-06-02 ENCOUNTER — Encounter: Payer: Self-pay | Admitting: Internal Medicine

## 2010-06-03 ENCOUNTER — Encounter: Payer: Self-pay | Admitting: Interventional Radiology

## 2010-06-07 ENCOUNTER — Ambulatory Visit: Payer: Self-pay | Admitting: Internal Medicine

## 2010-06-12 NOTE — Assessment & Plan Note (Signed)
Summary: HOSP FU/YF   History of Present Illness Visit Type: Follow-up Visit Primary GI MD: Melvia Heaps MD [facility] Primary Provider: [name], MD  Requesting Provider: na Chief Complaint: Hosp f/u. Patient c/o upper abd pain that comes and go  History of Present Illness:   77 Y.O FEMALE KNOWN TO DR. KAPLAN WITH DX OF SEGMENTAL ARTERIAL MEDIOLYSIS (SAMS SYNDROME), SHE ALSO HAS GERD,COLON POLYPS ,AND DIVERTICULOSIS. SHE WAS RECENTLY HOSPITALIZED ON S.E. CARDIOLOGY SERVICE WITH AND EPISODE OF INTENSE EPIGASTRIC  PAIN ,RADIATING INTO HER CHEST.SHE HAD MILD TRANSAMINITIS. SHE UNDERWENT CT ANGIO OF THE CHEST AND ABDOMEN. THERE WAS NO EVIDENCE OF ANEURYSM OR DISSECTIONS,PROBABLE PULM  HTN,ALSO NOTED BORDERLINE CBD.   SHE IS HERE TO FOLLOW UP TODAY, NOT SEEN BY GI IN THE HOSPITAL.SHE REPORTS THAT SHE HAD ONE OTHER EPISODE SINCE DISCHARGE,THAT OCCURED 4 DAYS AGO. HER PAIN ALWAYS STARTS IN THE LUQ AND RADIATES ACROSS HER UPPER ABDOMEN. SHE DESCRIBES IT AS AN INTENSE CRAMPY PAIN.SOMETIMES SHE WILL GET DIAPHORETIC WITH THESE ,USUALLY NAUSEATED,SOMETIMES VOMITS. SHE TAKES VICODEN,AND IF SHE CAN TAKE ONE BEFORE SHE GETS NAUSEATED SHE WILL DO OK.  SHE HAS HAD A COUPLE OF TWINGES SINCE,NOTHING MORE. EATING OK, NO POST PRANDIAL PAIN,BM'S NORMAL. SHE SAYS THE LAST BAD SPELL SHE HAD WAS 2 YEARS AGO.SHE HAS HAD PRIOR ERCP'S ETC/NO STONES. HER SXS ARE FELY DUE TO THE 'SAMS"SYNDROME. SHE HAS HAD HEPATIC  ARTERY EMBOLIZATIONS ETC. IN THE PAST.ALSO HAS HX OF FIBROMUSCULAR DYSPLASIA   GI Review of Systems    Reports abdominal pain and  nausea.     Location of  Abdominal pain: LUQ.    Denies acid reflux, belching, bloating, chest pain, dysphagia with liquids, dysphagia with solids, heartburn, loss of appetite, vomiting, vomiting blood, and  weight loss.        Denies anal fissure, black tarry stools, change in bowel habit, constipation, diarrhea, diverticulosis, fecal incontinence, heme positive stool,  hemorrhoids, irritable bowel syndrome, jaundice, light color stool, liver problems, rectal bleeding, and  rectal pain.    Current Medications (verified): 1)  Amiodarone Hcl 200 Mg  Tabs (Amiodarone Hcl) .... Take 1 1/2  By Mouth Once Daily 2)  Cardizem 120 Mg  Tabs (Diltiazem Hcl) .... Take 1 By Mouth Once Daily 3)  Cozaar 50 Mg  Tabs (Losartan Potassium) .... Take 1 By Mouth Once Daily 4)  Clarinex 5 Mg  Tabs (Desloratadine) .... Take 1 By Mouth Once Daily 5)  Plavix 75 Mg  Tabs (Clopidogrel Bisulfate) .... Take 1 By Mouth Once Daily 6)  Aspirin Adult Low Strength 81 Mg  Tbec (Aspirin) .... Take 1 By Mouth Once Daily 7)  Calcium 500 Mg  Tabs (Calcium Carbonate) .... Take 2 By Mouth Once Daily 8)  Multivitamins   Tabs (Multiple Vitamin) .... Take 1 By Mouth Once Daily 9)  Cvs Vitamin E 400 Unit  Caps (Vitamin E) .... Take 2 By Mouth Once Daily 10)  Fish Oil 1000 Mg  Caps (Omega-3 Fatty Acids) .... Take 2 By Mouth Once Daily 11)  Allergy Vaccine 1:10 G. H. 12)  Claritin 10 Mg  Tabs (Loratadine) .... Take 1 By Mouth Once Daily As Needed Allergies 13)  Protonix 40 Mg Tbec (Pantoprazole Sodium) .... Take 1 By Mouth Once Daily  Allergies (verified): No Known Drug Allergies  Past History:  Past Medical History: COLONIC POLYPS, HYPERPLASTIC, HX OF (ICD-V12.72) DIVERTICULOSIS, COLON (ICD-562.10) ? of COPD (ICD-496) EUSTACHIAN TUBE DYSFUNCTION (ICD-381.81) Hx of ATRIAL FIBRILLATION (ICD-427.31) ALLERGIC RHINITIS (ICD-477.9) HYPERTROPHIC CARDIOMYOPATHY  FIBROMUSCULAR DYSPLASIA SEGMENTAL  ARTERIAL MEDIOLYSIS-HX OF MULTIPLE ANEURYSMS-PRIOR EMBOLIZATION OF HEPATIC ARTERY ANEURYSM HTN HYPERLIPIDEMIA  Past Surgical History: Reviewed history from 03/25/2008 and no changes required. Cholecystectomy Hemorrhoidectomy  Family History: mother died at 17 from heart attack, also had prostate cancer father died at 47 from heart failure 4 sisters; 2 living 1 sister has arthritis other sisters  are/were healthy No FH of Colon Cancer:  Social History: Reviewed history from 12/28/2007 and no changes required. former smoker for 31yrs positive for second-hand smoke exposure occasionally exercises rarely drinks caffeine no etoh married 4 children, all boys  Review of Systems       The patient complains of allergy/sinus and cough.  The patient denies anemia, anxiety-new, arthritis/joint pain, back pain, blood in urine, breast changes/lumps, change in vision, confusion, coughing up blood, depression-new, fainting, fatigue, fever, headaches-new, hearing problems, heart murmur, heart rhythm changes, itching, menstrual pain, muscle pains/cramps, night sweats, nosebleeds, pregnancy symptoms, shortness of breath, skin rash, sleeping problems, sore throat, swelling of feet/legs, swollen lymph glands, thirst - excessive , urination - excessive , urination changes/pain, urine leakage, vision changes, and voice change.         ROS OTHERWISE AS IN HPI  Vital Signs:  Patient profile:   77 year old female Height:      63 inches Weight:      150 pounds BMI:     26.67 BSA:     1.71 Pulse rate:   58 / minute Pulse rhythm:   regular BP sitting:   124 / 86  (left arm) Cuff size:   regular  Vitals Entered By: Ok Anis CMA (October 25, 2009 10:49 AM)  Physical Exam  General:  Well developed, well nourished, no acute distress. Head:  Normocephalic and atraumatic. Eyes:  PERRLA, no icterus. Neck:  Supple; no masses or thyromegaly. Lungs:  Clear throughout to auscultation. Heart:  Regular rate and rhythm; no murmurs, rubs,  or bruits.heart murmur systolic:.   Abdomen:  SOFT, BASICALLY NONTENDER, NO MASS OR HSM,NO BRUIT  HEARD,BS+ Rectal:  NOT DONE Extremities:  No clubbing, cyanosis, edema or deformities noted. Neurologic:  Alert and  oriented x4;  grossly normal neurologically. Psych:  Alert and cooperative. Normal mood and affect.   Impression & Recommendations:  Problem # 1:   ABDOMINAL PAIN-LUQ (ICD-789.02) Assessment Deteriorated 77 YO FEMALE WITH HX OF SEGMENTAL ARTERIAL MEDIOLYSIS,WITH RECURRENT EPISODIC LUQ AND EPIGASTRIC PAIN,SOMETIMES INTENSE AND ASSOCIATED WITH NAUSEA, ANS DIAPHORESIS. RECENT HOSPITALIZATION FROR SAME . WORKUP NEGATIVE FOR ANEURYSMS,DISSECTIONS. SUSPECT HER EPISODES ARE VASCULAR IN NATURE-MESENTERIC INSUFFICIENCY,LARGE VESSELS PATENT ON CT ANGIO6/8/11.  OBSERVATION DISCUSSED WITH DR. KAPLAN,NO FURTHER WORKUP AT THIS TIME. SHE IS ADVISED TO CALL BACK FOR ANY RECUURENT EPISODES, AND IF SEVERE WITH NAUEA/VOMITING,DIAPHORESIS TO GO TO THE ER. IF SXS RECUR IN NEAR FUTURE MAY NEED A MESENTERIC ARTERIOGRAM REFILL VICODEN FOR as needed USE  Problem # 2:  GERD (ICD-530.81) Assessment: Comment Only CONTINUE PROTONIX DAILY  Problem # 3:  COLONIC POLYPS, HYPERPLASTIC, HX OF (ICD-V12.72) DUE FOR FOLLOW UP COLONOSCOPY 2013.  Problem # 4:  HYPERTROPHIC CARDIOMYOPATHY Assessment: Comment Only ON CHRONIC PLAVIX/ASA  Patient Instructions: 1)  We have given you a prescription for Vicodin . You can take to your pharmacy.  2)  Call us if you have any further significant episodes of pain. 3)  You may need an upper Endoscopy. Prescriptions: VICODIN 5-500 MG TABS (HYDROCODONE-ACETAMINOPHEN) Take 1 tab every 6 hours as needed for pain  #30 x 0   Entered by:  Pam Peterman NCMA   Authorized by:   Sammuel Cooper PA-c   Signed by:   Lowry Ram NCMA on 10/25/2009   Method used:   Printed then faxed to ...       [facility]* (retail)       [address]       [place], [place]  [address]       Ph: [phone] or [phone]       Fax: [phone]   RxID:   [id number]

## 2010-06-12 NOTE — Progress Notes (Signed)
Summary: [facility] appt. Scheduled  Phone Note Call from Patient Call back at Home Phone [phone]   Caller: Patient Call For: [name] Reason for Call: Talk to Nurse Summary of Call: Patient wants to speak to nurse regarding a "pain attack" she had yesterday.  Initial call taken by: Tawni Levy,  November 06, 2009 2:24 PM  Follow-up for Phone Call        Last OV 10-25-09. Calling with c/o another attack of pain yesterday.  Pt. states pain was same as prior episodes, began around 1pm yesterday and lasted until 10pm last night.  Pt. feeling fine today. Pt. wants to  know if there is anything that will work faster than the Vicodin?  [facility] PLEASE ADVISE  Follow-up by: [name] LPN,  November 06, 2009 3:08 PM  Additional Follow-up for Phone Call Additional follow up Details #1::        She needs to f/u at [facility] b/c of recurrent pain.  Vicodin is as good as anything else. Additional Follow-up by: [name] MD,  November 07, 2009 8:15 AM    Additional Follow-up for Phone Call Additional follow up Details #2::    Above MD orders reviewed with patient, she states she hasn't been to [facility] in over 10 years.  [facility] PLEASE ADVISE  Follow-up by: [name] LPN,  November 07, 2009 8:37 AM  Additional Follow-up for Phone Call Additional follow up Details #3:: Details for Additional Follow-up Action Taken: Per Dr.Kaplan, we need to get her re-established at [facility]. Pt. states she saw Dr.Tuttle. I will call her with appt. information.  [name] LPN  November 07, 2009 1:48 PM   Per [facility], no Dr.Tuttle there, but I have faxed pt. records, they will have the MD review and will callback with  the pt's appt.  [name] is updated and aware I will call her as soon as they notify me of appt.  Pt. instructed to call back as needed.  Additional Follow-up by: [name] LPN,  November 09, 2009 11:58 AM   Appended Document: [facility] appt. Scheduled Pt. will see Dr.Alastair  [name] 12-05-09 at 10:50am. Pt. is aware of appt. Pt. instructed to call back as needed.

## 2010-06-12 NOTE — Consult Note (Signed)
Summary: [facility]   Imported By: Sherian Rein 12/26/2009 08:52:43  _____________________________________________________________________  External Attachment:    Type:   Image     Comment:   External Document

## 2010-06-12 NOTE — Miscellaneous (Signed)
Summary: Injection Record/[facility]  Injection Record/ All[facility]   Imported By: Sherian Rein 10/03/2009 09:01:46  _____________________________________________________________________  External Attachment:    Type:   Image     Comment:   External Document

## 2010-06-12 NOTE — Assessment & Plan Note (Signed)
Summary: ALLERGY/MH  Nurse Visit   Allergies: No Known Drug Allergies  Orders Added: 1)  Flu Vaccine 9yrs + MEDICARE PATIENTS [Q2039] 2)  Administration Flu vaccine - MCR [G0008] Flu Vaccine Consent Questions     Do you have a history of severe allergic reactions to this vaccine? no    Any prior history of allergic reactions to egg and/or gelatin? no    Do you have a sensitivity to the preservative Thimersol? no    Do you have a past history of Guillan-Barre Syndrome? no    Do you currently have an acute febrile illness? no    Have you ever had a severe reaction to latex? no    Vaccine information given and explained to patient? yes    Are you currently pregnant? no    Lot Number:[id number]   Exp Date:11/10/2010   Site Given  Left Deltoid IMmedflu   [name]  February 13, 2010 11:07 AM

## 2010-06-12 NOTE — Progress Notes (Signed)
Summary: Labs for Arteriogram  Phone Note Call from Patient Call back at Home Phone [phone]   Caller: Patient Call For: Dr [name] Reason for Call: Talk to Nurse Summary of Call: Patient wants to speak to nurse regarding appt that supposed to be done at [facility]. Initial call taken by: Tawni Levy,  January 17, 2010 3:50 PM  Follow-up for Phone Call        Pt. saw Dr.Smith on 12-05-09. They want her to have an arteriorgram, scheduled for 01-25-10.  She needs labs done here and have the results sent to them.  I will call Osie Bond at [phone] to find out what labs she needs and where to fax them.  Follow-up by: [name] LPN,  January 18, 2010 9:10 AM  Additional Follow-up for Phone Call Additional follow up Details #1::        Mesg. left at above # for someone to callback and tell me what labs pt. needs and where to fax the results. [name] LPN  January 18, 2010 11:31 AM     Additional Follow-up for Phone Call Additional follow up Details #2::    Per Alice: PT, PTT, CBCD, & Creat.  Fax results to:[phone]. Attn: Alice  Pt. will have labs done tomorrow. Pt. instructed to call back as needed.  Follow-up by: [name] LPN,  January 18, 2010 12:57 PM  Additional Follow-up for Phone Call Additional follow up Details #3:: Details for Additional Follow-up Action Taken: Labs faxes to [facility] at above fax#. Additional Follow-up by: [name] LPN,  January 19, 2010 12:26 PM

## 2010-06-12 NOTE — Miscellaneous (Signed)
Summary: Injection Record / [facility]    Injection Record / [facility]    Imported By: Lennie Odor 01/12/2010 11:12:18  _____________________________________________________________________  External Attachment:    Type:   Image     Comment:   External Document

## 2010-06-12 NOTE — Miscellaneous (Signed)
Summary: Injection [employer] [facility] [employer]   Imported By: Sherian Rein 04/04/2010 14:25:00  _____________________________________________________________________  External Attachment:    Type:   Image     Comment:   External Document

## 2010-06-12 NOTE — Miscellaneous (Signed)
Summary: Injection Record/[facility]  Injection Record/[facility]   Imported By: Lanelle Bal 09/13/2009 14:06:48  _____________________________________________________________________  External Attachment:    Type:   Image     Comment:   External Document
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
## 2010-06-13 DIAGNOSIS — J301 Allergic rhinitis due to pollen: Secondary | ICD-10-CM

## 2010-06-20 DIAGNOSIS — J301 Allergic rhinitis due to pollen: Secondary | ICD-10-CM

## 2010-06-29 ENCOUNTER — Ambulatory Visit (INDEPENDENT_AMBULATORY_CARE_PROVIDER_SITE_OTHER): Payer: Medicare Other | Admitting: Internal Medicine

## 2010-06-29 ENCOUNTER — Ambulatory Visit (INDEPENDENT_AMBULATORY_CARE_PROVIDER_SITE_OTHER): Payer: Medicare Other

## 2010-06-29 ENCOUNTER — Ambulatory Visit (INDEPENDENT_AMBULATORY_CARE_PROVIDER_SITE_OTHER)
Admission: RE | Admit: 2010-06-29 | Discharge: 2010-06-29 | Disposition: A | Discharge status: Home / Self Care | Payer: Medicare Other | Source: Ambulatory Visit | Attending: Internal Medicine | Admitting: Internal Medicine

## 2010-06-29 ENCOUNTER — Encounter: Payer: Self-pay | Admitting: Internal Medicine

## 2010-06-29 ENCOUNTER — Other Ambulatory Visit: Payer: Self-pay | Admitting: Internal Medicine

## 2010-06-29 DIAGNOSIS — J309 Allergic rhinitis, unspecified: Secondary | ICD-10-CM

## 2010-06-29 DIAGNOSIS — J449 Chronic obstructive pulmonary disease, unspecified: Secondary | ICD-10-CM

## 2010-06-29 DIAGNOSIS — J301 Allergic rhinitis due to pollen: Secondary | ICD-10-CM

## 2010-07-05 ENCOUNTER — Ambulatory Visit (INDEPENDENT_AMBULATORY_CARE_PROVIDER_SITE_OTHER): Payer: Medicare Other

## 2010-07-05 DIAGNOSIS — J301 Allergic rhinitis due to pollen: Secondary | ICD-10-CM

## 2010-07-10 ENCOUNTER — Ambulatory Visit (INDEPENDENT_AMBULATORY_CARE_PROVIDER_SITE_OTHER): Payer: Medicare Other

## 2010-07-10 DIAGNOSIS — J301 Allergic rhinitis due to pollen: Secondary | ICD-10-CM
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
## 2010-07-17 ENCOUNTER — Encounter: Payer: Self-pay | Admitting: Internal Medicine

## 2010-07-17 ENCOUNTER — Ambulatory Visit (INDEPENDENT_AMBULATORY_CARE_PROVIDER_SITE_OTHER): Payer: Medicare Other

## 2010-07-17 DIAGNOSIS — J301 Allergic rhinitis due to pollen: Secondary | ICD-10-CM | POA: Insufficient documentation

## 2010-07-24 NOTE — Assessment & Plan Note (Signed)
Summary: ALLERGY/CB   Nurse Visit   Allergies: No Known Drug Allergies  Orders Added: 1)  Allergy Injection (1) [95115] 

## 2010-07-25 ENCOUNTER — Encounter: Payer: Self-pay | Admitting: Internal Medicine

## 2010-07-25 ENCOUNTER — Ambulatory Visit (INDEPENDENT_AMBULATORY_CARE_PROVIDER_SITE_OTHER): Payer: Medicare Other

## 2010-07-25 DIAGNOSIS — J301 Allergic rhinitis due to pollen: Secondary | ICD-10-CM

## 2010-07-27 ENCOUNTER — Encounter: Payer: Self-pay | Admitting: Internal Medicine

## 2010-07-27 DIAGNOSIS — J301 Allergic rhinitis due to pollen: Secondary | ICD-10-CM

## 2010-07-30 LAB — DIFFERENTIAL
Lymphocytes Relative: 13 % (ref 12–46)
Lymphs Abs: 1.3 10*3/uL (ref 0.7–4.0)
Monocytes Absolute: 0.8 10*3/uL (ref 0.1–1.0)
Monocytes Relative: 8 % (ref 3–12)
Neutro Abs: 7.9 10*3/uL — ABNORMAL HIGH (ref 1.7–7.7)

## 2010-07-30 LAB — CK TOTAL AND CKMB (NOT AT ARMC)
CK, MB: 1.6 ng/mL (ref 0.3–4.0)
Relative Index: INVALID (ref 0.0–2.5)

## 2010-07-30 LAB — TROPONIN I: Troponin I: 0.02 ng/mL (ref 0.00–0.06)

## 2010-07-30 LAB — URINE MICROSCOPIC-ADD ON

## 2010-07-30 LAB — POCT I-STAT, CHEM 8
BUN: 20 mg/dL (ref 6–23)
Calcium, Ion: 1.13 mmol/L (ref 1.12–1.32)
Chloride: 107 mEq/L (ref 96–112)
Glucose, Bld: 110 mg/dL — ABNORMAL HIGH (ref 70–99)
HCT: 42 % (ref 36.0–46.0)
Potassium: 4.4 mEq/L (ref 3.5–5.1)

## 2010-07-30 LAB — HEPATIC FUNCTION PANEL
ALT: 84 U/L — ABNORMAL HIGH (ref 0–35)
AST: 156 U/L — ABNORMAL HIGH (ref 0–37)
Alkaline Phosphatase: 106 U/L (ref 39–117)
Bilirubin, Direct: 0.3 mg/dL (ref 0.0–0.3)
Total Bilirubin: 1 mg/dL (ref 0.3–1.2)

## 2010-07-30 LAB — URINALYSIS, ROUTINE W REFLEX MICROSCOPIC
Ketones, ur: NEGATIVE mg/dL
Protein, ur: NEGATIVE mg/dL
Urobilinogen, UA: 0.2 mg/dL (ref 0.0–1.0)

## 2010-07-30 LAB — URINE CULTURE: Colony Count: 9000

## 2010-07-30 LAB — POCT CARDIAC MARKERS
CKMB, poc: 1.5 ng/mL (ref 1.0–8.0)
Troponin i, poc: <0.05 ng/mL (ref 0.00–0.09)
Troponin i, poc: <0.05 ng/mL (ref 0.00–0.09)

## 2010-07-30 LAB — AMYLASE: Amylase: 23 U/L (ref 0–105)

## 2010-07-30 LAB — COMPREHENSIVE METABOLIC PANEL
AST: 133 U/L — ABNORMAL HIGH (ref 0–37)
BUN: 10 mg/dL (ref 6–23)
CO2: 23 mEq/L (ref 19–32)
Calcium: 8 mg/dL — ABNORMAL LOW (ref 8.4–10.5)
Creatinine, Ser: 0.97 mg/dL (ref 0.4–1.2)
GFR calc Af Amer: >60 mL/min (ref >60)
GFR calc non Af Amer: 55 mL/min — ABNORMAL LOW (ref >60)

## 2010-07-30 LAB — HEMOGLOBIN A1C: Mean Plasma Glucose: 108 mg/dL (ref <117)

## 2010-07-30 LAB — CBC
Hemoglobin: 13.6 g/dL (ref 12.0–15.0)
MCHC: 33.3 g/dL (ref 30.0–36.0)
RBC: 4.01 MIL/uL (ref 3.87–5.11)
WBC: 10.1 10*3/uL (ref 4.0–10.5)

## 2010-07-30 LAB — TSH: TSH: 2.26 u[IU]/mL (ref 0.350–4.500)

## 2010-07-30 LAB — LIPASE, BLOOD: Lipase: 32 U/L (ref 11–59)

## 2010-07-31 NOTE — Assessment & Plan Note (Signed)
Summary: EXTRACT/10/CB  Nurse Visit   Allergies: No Known Drug Allergies  Orders Added: 1)  [facility] Therapy Services,1 or multi ([facility]) [95165] 

## 2010-07-31 NOTE — Assessment & Plan Note (Signed)
Summary: allergy/cb  Nurse Visit   Allergies: No Known Drug Allergies  Orders Added: 1)  Allergy Injection (1) [95115] 

## 2010-08-01 ENCOUNTER — Ambulatory Visit (INDEPENDENT_AMBULATORY_CARE_PROVIDER_SITE_OTHER): Payer: Medicare Other

## 2010-08-01 DIAGNOSIS — J301 Allergic rhinitis due to pollen: Secondary | ICD-10-CM

## 2010-08-07 ENCOUNTER — Ambulatory Visit (INDEPENDENT_AMBULATORY_CARE_PROVIDER_SITE_OTHER): Payer: Medicare Other

## 2010-08-07 DIAGNOSIS — J301 Allergic rhinitis due to pollen: Secondary | ICD-10-CM

## 2010-08-21 ENCOUNTER — Ambulatory Visit (INDEPENDENT_AMBULATORY_CARE_PROVIDER_SITE_OTHER): Payer: Medicare Other

## 2010-08-21 DIAGNOSIS — J301 Allergic rhinitis due to pollen: Secondary | ICD-10-CM

## 2010-08-21 LAB — BASIC METABOLIC PANEL
BUN: 22 mg/dL (ref 6–23)
CO2: 27 mEq/L (ref 19–32)
Calcium: 8.2 mg/dL — ABNORMAL LOW (ref 8.4–10.5)
Calcium: 9.4 mg/dL (ref 8.4–10.5)
Chloride: 108 mEq/L (ref 96–112)
Creatinine, Ser: 1 mg/dL (ref 0.4–1.2)
Creatinine, Ser: 1.18 mg/dL (ref 0.4–1.2)
GFR calc Af Amer: >60 mL/min (ref >60)
GFR calc non Af Amer: 44 mL/min — ABNORMAL LOW (ref >60)
Glucose, Bld: 108 mg/dL — ABNORMAL HIGH (ref 70–99)
Glucose, Bld: 92 mg/dL (ref 70–99)
Potassium: 3.9 mEq/L (ref 3.5–5.1)

## 2010-08-21 LAB — CBC
HCT: 41 % (ref 36.0–46.0)
MCHC: 33.7 g/dL (ref 30.0–36.0)
MCV: 99.2 fL (ref 78.0–100.0)
Platelets: 146 10*3/uL — ABNORMAL LOW (ref 150–400)
RBC: 4.05 MIL/uL (ref 3.87–5.11)
RDW: 12.3 % (ref 11.5–15.5)
RDW: 13.5 % (ref 11.5–15.5)
WBC: 6.7 10*3/uL (ref 4.0–10.5)

## 2010-08-21 LAB — PROTIME-INR
INR: 1 (ref 0.00–1.49)
Prothrombin Time: 13.8 seconds (ref 11.6–15.2)

## 2010-08-21 LAB — CROSSMATCH: ABO/RH(D): A POS

## 2010-08-21 LAB — APTT: aPTT: 27 seconds (ref 24–37)

## 2010-08-28 LAB — DIFFERENTIAL
Basophils Absolute: 0 10*3/uL (ref 0.0–0.1)
Eosinophils Relative: 0 % (ref 0–5)
Lymphocytes Relative: 18 % (ref 12–46)
Lymphs Abs: 1.3 10*3/uL (ref 0.7–4.0)
Monocytes Absolute: 0.6 10*3/uL (ref 0.1–1.0)
Monocytes Relative: 9 % (ref 3–12)
Neutro Abs: 5 10*3/uL (ref 1.7–7.7)

## 2010-08-28 LAB — POCT CARDIAC MARKERS
CKMB, poc: <1 ng/mL — ABNORMAL LOW (ref 1.0–8.0)
Myoglobin, poc: 64.3 ng/mL (ref 12–200)
Troponin i, poc: <0.05 ng/mL (ref 0.00–0.09)

## 2010-08-28 LAB — COMPREHENSIVE METABOLIC PANEL
ALT: 67 U/L — ABNORMAL HIGH (ref 0–35)
ALT: 69 U/L — ABNORMAL HIGH (ref 0–35)
AST: 62 U/L — ABNORMAL HIGH (ref 0–37)
AST: 68 U/L — ABNORMAL HIGH (ref 0–37)
Albumin: 3.3 g/dL — ABNORMAL LOW (ref 3.5–5.2)
Alkaline Phosphatase: 43 U/L (ref 39–117)
BUN: 12 mg/dL (ref 6–23)
CO2: 19 mEq/L (ref 19–32)
CO2: 21 mEq/L (ref 19–32)
Calcium: 6.6 mg/dL — ABNORMAL LOW (ref 8.4–10.5)
Calcium: 6.8 mg/dL — ABNORMAL LOW (ref 8.4–10.5)
Calcium: 7.3 mg/dL — ABNORMAL LOW (ref 8.4–10.5)
Creatinine, Ser: 0.69 mg/dL (ref 0.4–1.2)
Creatinine, Ser: 0.95 mg/dL (ref 0.4–1.2)
GFR calc Af Amer: >60 mL/min (ref >60)
GFR calc Af Amer: >60 mL/min (ref >60)
GFR calc non Af Amer: 55 mL/min — ABNORMAL LOW (ref >60)
GFR calc non Af Amer: >60 mL/min (ref >60)
Glucose, Bld: 89 mg/dL (ref 70–99)
Potassium: 3.8 mEq/L (ref 3.5–5.1)
Sodium: 138 mEq/L (ref 135–145)
Sodium: 139 mEq/L (ref 135–145)
Total Protein: 4.8 g/dL — ABNORMAL LOW (ref 6.0–8.3)
Total Protein: 5.7 g/dL — ABNORMAL LOW (ref 6.0–8.3)

## 2010-08-28 LAB — CBC
HCT: 47.5 % — ABNORMAL HIGH (ref 36.0–46.0)
Hemoglobin: 13.9 g/dL (ref 12.0–15.0)
MCHC: 35 g/dL (ref 30.0–36.0)
MCV: 97.4 fL (ref 78.0–100.0)
MCV: 97.6 fL (ref 78.0–100.0)
Platelets: 140 10*3/uL — ABNORMAL LOW (ref 150–400)
RBC: 4.07 MIL/uL (ref 3.87–5.11)
RDW: 13.8 % (ref 11.5–15.5)
RDW: 13.8 % (ref 11.5–15.5)
WBC: 6.8 10*3/uL (ref 4.0–10.5)

## 2010-08-28 LAB — URINALYSIS, ROUTINE W REFLEX MICROSCOPIC
Glucose, UA: NEGATIVE mg/dL
Hgb urine dipstick: NEGATIVE
Ketones, ur: 15 mg/dL — AB
Protein, ur: NEGATIVE mg/dL
Urobilinogen, UA: 1 mg/dL (ref 0.0–1.0)

## 2010-08-28 LAB — LIPID PANEL: HDL: 39 mg/dL — ABNORMAL LOW (ref >39)

## 2010-08-28 LAB — APTT: aPTT: 26 seconds (ref 24–37)

## 2010-08-29 ENCOUNTER — Ambulatory Visit (INDEPENDENT_AMBULATORY_CARE_PROVIDER_SITE_OTHER): Payer: Medicare Other

## 2010-08-29 ENCOUNTER — Telehealth: Payer: Self-pay | Admitting: Internal Medicine

## 2010-08-29 DIAGNOSIS — J309 Allergic rhinitis, unspecified: Secondary | ICD-10-CM

## 2010-08-29 NOTE — Telephone Encounter (Signed)
atc unable to leave VM. wcb

## 2010-08-29 NOTE — Telephone Encounter (Signed)
lmomtcb x1 

## 2010-08-29 NOTE — Telephone Encounter (Signed)
Patient phoned stating that she was returning a call to triage she can be reached at [phone].[name]

## 2010-08-30 MED ORDER — HYDROCODONE-HOMATROPINE 5-1.5 MG/5ML PO SYRP: ORAL_SOLUTION | ORAL | Status: DC

## 2010-08-30 NOTE — Telephone Encounter (Signed)
Spoke w/ pt and she c/o bad cough w/ occasional plae yellow phlem x Monday. Pt states her cough is keeping her up all night and she can't sleep. Pt is coughing so much it is causing her to lose her voice. Pt has tried OTC Robitussin but has had no relief. Pt would like cough syrup called in to [facility] [address]. Pt was last seen 06/29/10 and next OV 12/28/10. Please advise Dr. Maple Hudson Thanks  Allergies no known allergies  Carver Fila, CMA

## 2010-08-30 NOTE — Telephone Encounter (Signed)
Ok to give Hydromet #243ml 1 tsp every 6 hours prn cough no refills.

## 2010-08-30 NOTE — Telephone Encounter (Signed)
Called and spoke with pt. Pt aware rx sent to pharmacy.   

## 2010-09-07 ENCOUNTER — Ambulatory Visit (INDEPENDENT_AMBULATORY_CARE_PROVIDER_SITE_OTHER): Payer: Medicare Other

## 2010-09-07 DIAGNOSIS — J309 Allergic rhinitis, unspecified: Secondary | ICD-10-CM

## 2010-09-13 ENCOUNTER — Ambulatory Visit (INDEPENDENT_AMBULATORY_CARE_PROVIDER_SITE_OTHER): Payer: Medicare Other

## 2010-09-13 DIAGNOSIS — J309 Allergic rhinitis, unspecified: Secondary | ICD-10-CM

## 2010-09-19 ENCOUNTER — Ambulatory Visit (INDEPENDENT_AMBULATORY_CARE_PROVIDER_SITE_OTHER): Payer: Medicare Other

## 2010-09-19 DIAGNOSIS — J309 Allergic rhinitis, unspecified: Secondary | ICD-10-CM

## 2010-09-25 ENCOUNTER — Other Ambulatory Visit: Payer: Self-pay | Admitting: Interventional Radiology

## 2010-09-25 DIAGNOSIS — N2889 Other specified disorders of kidney and ureter: Secondary | ICD-10-CM

## 2010-09-25 NOTE — Discharge Summary (Signed)
[name], [name]               ACCOUNT NO.:  [id number]   MEDICAL RECORD NO.:  [id number]          PATIENT TYPE:  INP   LOCATION:  5121                         FACILITY:  [facility]   PHYSICIAN:  Elliot Cousin, M.D.    DATE OF BIRTH:  1925/06/17   DATE OF ADMISSION:  03/18/2008  DATE OF DISCHARGE:  03/20/2008                               DISCHARGE SUMMARY   DISCHARGE DIAGNOSES:  1. Abdominal pain associated with hepatic transaminitis.  Transient      vascular insult suspected.  2. History of hepatic artery aneurysm with embolization in the 1990s.  3. Hypertension.  4. Chronic paroxysmal atrial fibrillation.  5. Hyperlipidemia.  6. Mild leukocytosis.   SECONDARY DISCHARGE DIAGNOSES:  1. History of colon polyps, adenomatous in 2005.  2. Abdominal pain with hepatic transaminitis in September 2007.      Endoscopic retrograde cholangiopancreatography unremarkable.      Etiology felt to be a vascular insult versus passage of a common      bile duct stone.  3. Acute pancreatitis following the endoscopic retrograde      cholangiopancreatography in September 2007.  4. Status post sphincterotomy in the 1990s.   DISCHARGE MEDICATIONS:  1. Aspirin 81 mg daily.  2. Plavix 75 mg half a tablet daily.  3. Cardizem CD 120 mg daily.  4. Amiodarone 200 mg daily.  5. Claritin 10 mg daily.  6. Multivitamin once daily.  7. MiraLax laxative 17 g added to 8 ounces of water daily as needed      for constipation.  8. Do not take Crestor until reevaluated by your primary care      physician, cardiologist, or gastroenterologist.   CONSULTATION:  Gastroenterologist, Wilhemina Bonito. Marina Goodell, MD, and colleagues.   PROCEDURES PERFORMED:  1. CT scan of the abdomen and pelvis on March 18, 2008.  The results      revealed stable appearance of hepatic embolization coils.  Stable      mild intrahepatic and extrahepatic biliary ductal dilatation.  Too      small to characterize left inferior pole renal lesion,  likely cyst.      Infrarenal abdominal aortic ectasia and arthrosclerosis.  Coronary      artery arthrosclerosis.  No acute pelvic abnormality.  Diffuse      colonic diverticulosis without diverticulitis.  2. Acute abdominal series on March 18, 2008.  The results revealed      no acute cardiopulmonary disease.  Peribronchial thickening, which      may relate to chronic bronchitis or smoking.  No evidence of bowel      obstruction or free intraperitoneal air.   HISTORY OF PRESENT ILLNESS:  The patient is an 77 year old woman with a  past medical history significant for hepatic artery aneurysm, status  post cholecystectomy, chronic atrial fibrillation, and hypertension.  She presented to the emergency department on March 18, 2008, with a  chief complaint of abdominal pain across the upper abdomen.  When she  was evaluated in the emergency department, she was noted to be afebrile  and hemodynamically stable.  Her lab data were significant for  a total  bilirubin of 2.9, alkaline phosphatase of 242, SGOT of 825, and SGPT of  378.  Her urinalysis revealed small leukocytes.  Her lipase was within  normal limits at 22.  Her lactic acid level was slightly elevated at  3.6.  Her white blood cell count was within normal limits at 7.1.  Her  blood acetaminophen level was 15.9.  Following the results of the blood  work, a CT scan of the abdomen and pelvis was ordered in the emergency  department.  The results are described above; however in essence, there  appeared to be no acute abdominal or pelvic abnormalities.  The  appearance of the hepatic embolization coils was stable.  The patient  was admitted for further evaluation and management.   For additional details, please see the dictated history and physical.   HOSPITAL COURSE:  1. ABDOMINAL PAIN ASSOCIATED WITH HEPATIC TRANSAMINITIS.  The patient      was started on maintenance IV fluids, prophylactic intravenous      Protonix, and  as-needed Dilaudid for pain.  A clear liquid diet was      given.  Gastroenterologist, Dr. Marina Goodell was consulted.  He reviewed      the patient's medical history and noted that the patient had a very      similar presentation in September 2007 for which she underwent an      extensive evaluation with a CT scan of the abdomen and pelvis and      ERCP.  At that time, it was suspected that the patient had a      transient vascular insult from the previous hepatic artery aneurysm      versus passage of a common bile duct stone.  The transaminitis      resolved spontaneously at that time.  Dr. Marina Goodell recommended      conservative treatment.  Eventually, the patient's abdominal pain      completely resolved.  Her followup liver transaminases improved but      did not completely normalize.  Prior to hospital discharge, her      total bilirubin was 2.4, the alkaline phosphatase was 254, SGOT was      225, and SGPT was 336.  Of note, her PT was within normal limits at      14.6 and her blood albumin was 2.9.  Her diet was eventually      advanced to a heart-healthy diet.  She tolerated the advancement in      diet well.  In fact, this morning, she ate almost all of her      breakfast without any acute distress.  The patient wants to go      home, and at this point, it seems reasonable to discharge her to      home with close followup with Dr. [name] and colleagues.  Per Dr.      Lamar Sprinkles note, a hepatic angiography would have been considered if      she had not improved spontaneously.  2. CHRONIC PAROXYSMAL ATRIAL FIBRILLATION.  The patient was maintained      on Cardizem.  However because of the hepatic transaminitis,      amiodarone was withheld.  The patient became somewhat upset that      the amiodarone was stopped.  She admitted taking her own amiodarone      in the hospital when it was not ordered.  I explained to the      patient that  the amiodarone was not ordered because of the      elevation  in her liver function tests.  She understood but stated      that it was more important for her to take her amiodarone to keep      her heart in rhythm.  Amiodarone was re-started prior to hospital      discharge.  Her rate was well controlled.  3. HYPERLIPIDEMIA.  Crestor was withheld during the hospitalization      because of hepatic transaminitis.  Her fasting lipid profile      revealed a total cholesterol of 132, triglycerides of 43, HDL      cholesterol of 51, and LDL cholesterol of 72.  The patient was      advised to continue to withhold Crestor until reevaluated by her      primary physicians.  4. HYPERTENSION.  The patient's blood pressure was well controlled      during the hospitalization.  5. TRANSIENT LEUKOCYTOSIS.  The patient's white blood cell count      increased to 15.1.  An urinalysis was      ordered and it revealed a small amount of leukocytes.  The patient      had no complaints of painful urination.  Her chest x-ray at the      time of the initial hospital assessment revealed no acute      cardiopulmonary disease.  The followup WBC normalized to 8.5      without antibiotic intervention.      Elliot Cousin, M.D.  Electronically Signed     DF/MEDQ  D:  03/20/2008  T:  03/20/2008  Job:  [id number]   cc:   [name], M.D.  Barbette Hair. [name], MD,FACG  Nanetta Batty, M.D.

## 2010-09-25 NOTE — H&P (Signed)
[name], [name]               ACCOUNT NO.:  [id number]   MEDICAL RECORD NO.:  [id number]          PATIENT TYPE:  INP   LOCATION:  4705                         FACILITY:  [facility]   PHYSICIAN:  Lonia Blood, M.D.DATE OF BIRTH:  06-08-1925   DATE OF ADMISSION:  06/20/2008  DATE OF DISCHARGE:                              HISTORY & PHYSICAL   CHIEF COMPLAINT:  Nausea, vomiting, diarrhea, and hypotension.   PRIMARY CARE PHYSICIAN:  [name], M.D.   HISTORY OF PRESENT ILLNESS:  [name] is an 77 years old female with  past medical history of chronic paroxysmal atrial fibrillation,  hypertension, hyperlipidemia, and history of hepatic artery aneurysm,  status post embolization in the 1990s who came to the emergency  department complaining of nausea, vomiting, and diarrhea since Saturday  midday.  The patient reports being in contact with sick patients who was  actually having the same symptoms that she presents today and also  reports more than 20 episodes of diarrhea throughout the week and more  10 episodes of vomiting.  The patient endorses abdominal pain together  with her symptoms, 7/10 in intensity at its worse diffusely, cramping in  nature, no aggravated factor, alleviated by morphine at the emergency  department and associated with chills, but no fever and also with  decreased appetite.  The patient denies dizziness, but she reports  feeling a little bit weak and denies consuming seafood or undercooked  meat prior to developing her symptoms.  The patient also denies  lightheadedness.   PAST MEDICAL HISTORY:  Chronic paroxysmal atrial fibrillation,  hypertension, hyperlipidemia, history of hepatic artery aneurysm with  embolization in the 1990s.  The patient also has history of right kidney  cyst and history of transaminitis with chronic CBD dilation, status post  sphincterectomy in 2007.   MEDICATIONS:  1. Cardizem 120 mg daily.  2. Plavix 75 mg daily.  3. Aspirin 81 mg daily.  4. Amiodarone 200 mg daily.  5. Multivitamin 1 tablet by mouth daily.  6. Vitamin E 1 tablet by mouth daily.  7. Fish oil 2 tablets by mouth daily.  8. Cozaar 50 mg 1 tablet by mouth daily.   ALLERGIES:  There is no known drug allergy.   SOCIAL HISTORY:  The patient denies alcohol, smoking, and also illicit  drug use.   FAMILY HISTORY:  Noncontributory at her age.   REVIEW OF SYSTEMS:  Other than that we are having already described in  her HPI, is not significant.   PHYSICAL EXAMINATION:  GENERAL:  This is an elderly Caucasian lady who  is actually lying on the stretcher without acute distress, resting  comfortable.  The patient is alert, awake, and oriented and at this  point was without complaints.  VITAL SIGNS:  Temperature of 98.2, we have oxygen saturation of 97% on  room air, blood pressure was 93/71, heart rate 57, respiratory rate 20.  Of note, there was a blood pressure of 88/56 at the moment that the  patient arrived, which is actually significant for profound hypotension  secondary to dehydration.  HEENT:  Mild mucous  membrane dryness, no erythema or exudates, but there  is no nystagmus.  Pupils are reactive to light and accommodation and  equally rounded.  CARDIOVASCULAR:  There is a mild bradycardia.  No murmurs, no gallops.  ABDOMEN:  Soft, nondistended without masses.  Positive bowel sounds.  Mild hyperactive with a little bit of soreness especially on the lower  quadrants bilaterally during palpation.  EXTREMITIES:  There was no edema, no cyanosis, or clubbing.  Good pulses  bilaterally.  NEUROLOGIC:  There was cranial nerve II through XII grossly intact.  Muscle strengths were 4/5 bilaterally symmetrical and no focal deficit.   LABORATORY DATA:  We have white blood cells of 6.8, hemoglobin 16.3, MCV  97.6, platelet count of 140.  We have sodium of 140, potassium 3.3,  chloride 112, carbon dioxide 16, blood glucose 87, BUN 12,  creatinine  0.95, albumin 3.3, AST 62, ALT 53.  We have PT of 13.8, INR 1.0, PTT 26.  The patient have a complete normal urinalysis with a specific gravity of  1.019.  The patient also had cardiac markers, which were completely  normal.   We have CT of abdomen, which actually show:  1. Stable appearance of hepatic embolization goals without acute      abdominal abnormality.  2. 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  3. A 1.5 cm intermediate attenuation, indeterminate renal cystic      lesion, which is recommended an MRI for further evaluation in the      future.  4. Stable intrahepatic biliary ductal dilatation.   CT of the pelvis, no acute pelvic abnormality, infrarenal abdominal  aortic ectasia.   ASSESSMENT AND PLAN:  1. Nausea, vomiting, and diarrhea, most likely secondary to viral      gastroenteritis.  The patient with normal white blood cells.  No      fevers and no further signs, symptoms, or infection.  We are going      to admit to telemetry.  We are going to check orthostatics.  We are      going to give Zofran p.r.n., IV fluid resuscitation, electrolyte      repletion, and we will advance diet slowly as tolerated.  2. Hypotension, most likely secondary to nausea, vomiting, and      diarrhea and also the use of her antiarrhythmic medications.  We      will continue IV fluids.  We are going to hold her antihypertensive      drugs for now.  We are going to place on tele bed and we are going      to monitor her vital signs every 4 hours.  3. Atrial fibrillation.  We are going to hold Cardizem for this acute      event of hypertension.  We are going to continue her amiodarone.      We are going to place on telemetry for evaluation.  We are going to      have a good repletion of her electrolytes, especially potassium.      Tomorrow we are going to reevaluate blood pressure and we are going      to restart her  antiarrhythmic medications.  4. Hypertension.  We are going to hold the Cozaar that she was using      for blood pressure control.  We are going to reassess in the      morning and continue drug at the same regimen  if blood pressure is      stable at that time.  5. Hyperlipidemia.  We are going to continue Crestor 5 mg by mouth      daily.  For now, we are going to check her fasting lipid profile,      because the patient had a mild transaminitis, which is said to be      chronic, we are going to      determine if she needs to be on statins depending on her LDL      levels, therefore we actually are discontinuing the medications.      For prophylaxis, we want to put the patient on Protonix 40 mg by      mouth daily, and we are going to use also SCDs with early      ambulation for deep venous thrombosis.      Rosanna Randy, MD  Electronically Signed      Lonia Blood, M.D.  Electronically Signed    CEM/MEDQ  D:  06/20/2008  T:  06/21/2008  Job:  [id number]

## 2010-09-25 NOTE — H&P (Signed)
NAMETAKEYSHA, BONK               ACCOUNT NO.:  [id number]   MEDICAL RECORD NO.:  [id number]          PATIENT TYPE:  INP   LOCATION:  5121                         FACILITY:  [facility]   PHYSICIAN:  Eduard Clos, MDDATE OF BIRTH:  27-Nov-1925   DATE OF ADMISSION:  03/18/2008  DATE OF DISCHARGE:                              HISTORY & PHYSICAL   PRIMARY CARE PHYSICIAN:  Dr. Nicholos Johns.   CHIEF COMPLAINT:  Abdominal pain.   HISTORY OF PRESENTING ILLNESS:  An 77 year old female with history of  hepatic artery aneurysm status post embolization, history of  hyperlipidemia, chronic atrial fibrillation, presented with complaints  of abdominal pain.  The patient states that abdominal pain started off  around 5 o'clock, constant, present across the upper abdomen, did not  radiate to the back, not related to food.  The patient was having some  nausea and vomiting, no diarrhea, no fever or chills.  Denies any  dizziness, loss of consciousness.  Denies any chest pain, shortness of  breath, weakness of limbs, dysuria or discharges.  In the ER the patient  was found to have LFTs very high and has been admitted for further  management and evaluation.  The patient denies taking any Tylenol or  Tylenol products.  She stated she did take one which she has not taken  for a long time.  The patient's abdominal pain is a dull aching pain,  present across the upper abdomen along with nausea and vomiting, food  does not increase the pain.  There is no associated diarrhea.   PAST MEDICAL HISTORY:  1. History of hepatic artery dilatation status post embolization.  2. History of hypertension, hyperlipidemia, chronic atrial      fibrillation.   PAST SURGICAL HISTORY:  Cholecystectomy and bunion removal on the left  foot.   MEDICATIONS ON ADMISSION:  1. Aspirin 81 mg p.o. daily.  2. Plavix 75 mg, takes half a tablet daily.  3. Cardizem CD 120 mg p.o. daily.  4. Crestor 10 mg p.o. daily.  5.  Amiodarone 20 mg p.o. daily.  6. Claritin 10 mg p.o. daily.   ALLERGIES:  NO KNOWN DRUG ALLERGIES.   FAMILY HISTORY:  Noncontributory.   SOCIAL HISTORY:  The patient lives with her husband.  Denies smoking  cigarettes, drinking alcohol or using illegal drugs.   REVIEW OF SYSTEMS:  As per history of present illness, nothing else  significant.   PHYSICAL EXAMINATION:  The patient examined at bedside, not in acute  distress.  VITAL SIGNS:  Blood pressure 120/60, pulse 70 per minute, temperature  98.1, respiratory rate 18 per minute, O2 sat 94%.  HEENT:  Anicteric, no pallor.  CHEST:  Bilateral air entry present, no rhonchi and no crepitation.  HEART:  S1-S2 heard.  ABDOMEN:  Soft, nontender, bowel sounds heard, no guarding or rigidity.  CNS: Alert, awake, oriented to time, place and person.  Moves upper and  lower extremities 5 out of 5.  EXTREMITIES:  Peripheral pulses felt, no edema.   LABS:  CT of the abdomen and pelvis:  Stable appearance of hepatic  embolization coils,  stable mild intrahepatic and extrahepatic biliary  ductal dilatation too small to characterize, left inferior pole renal  lesion likely cyst, infrarenal abdominal aortic ectasia, could not , no  acute pelvic abnormality, diffuse colonic diverticulosis without  diverticulitis.  CBC:  WBC 7.1, hemoglobin 15.3, hematocrit 45,  platelets 139, neutrophils 93%, PT/INR 12.7 and 0.9.  Complete metabolic  panel:  Sodium 134, potassium 3.8, chloride 107, carbon dioxide 19,  anion gap is 8, BUN 17, creatinine 1.3, total bilirubin 2.9, alkaline  phosphate 242, AST 825, ALT 378, total protein 5.7, albumin 3.4, calcium  8.1, lactic acid 3.6, lipase 22, acetaminophen level 15.9.  UA is  cloudy, WBC 3-6, blood negative, bilirubin moderate.   ASSESSMENT:  1. Abdominal pain with abnormal liver function tests.  2. History of hepatic artery embolization.  3. Chronic atrial fibrillation, rate controlled.  4. History of  hyperlipidemia.   Plan to admit the patient to medical floor.  Will start the patient on  gentle hydration, place on clear liquid diet, hold off amiodarone and  Crestor due to abnormal LFTs.  Will repeat LFTs, pain relief medication.  The patient will need a GI evaluation and we will follow their  recommendation.      Eduard Clos, MD  Electronically Signed     ANK/MEDQ  D:  03/18/2008  T:  03/18/2008  Job:  [id number]

## 2010-09-25 NOTE — Discharge Summary (Signed)
NAMEELISA, [name]               ACCOUNT NO.:  [id number]   MEDICAL RECORD NO.:  [id number]           PATIENT TYPE:   LOCATION:                                 FACILITY:   PHYSICIAN:  Renee Ramus, MD       DATE OF BIRTH:  [birth year]   DATE OF ADMISSION:  DATE OF DISCHARGE:                               DISCHARGE SUMMARY   PRIMARY DISCHARGE DIAGNOSIS:  Recent nausea, vomiting, and diarrhea.   SECONDARY DIAGNOSES:  1. Hypotension.  2. Bradycardia.  3. Paroxysmal atrial fibrillation.  4. Hyperlipidemia.  5. Status post hepatic artery aneurysm with embolization.  6. History of transaminitis.   HOSPITAL COURSE BY PROBLEM:  1. Nausea, vomiting, and dehydration:  The patient is an 77 year old      female, who was admitted secondary to nausea, vomiting, and      diarrhea.  The patient was seen in the emergency department.  She      admitted to our service.  She was placed initially on clear liquid      diet with IV fluids.  She did not have evidence of a bacterial      infection.  The patient was given supportive care.  She has made      good progress, and now, she is being discharged to home.  The      patient did show signs of clinical dehydration and has responded      well to fluids.  She does have evidence of transaminitis, but this      is chronic.  She does have a mild hypoalbuminemia.  The patient is      now to continue liquid diet and transition to full diet.  She will      follow up with her primary care physician if needed.  2. Paroxysmal atrial fibrillation:  The patient has been on rate      control.  We will hold Cardizem at discharge since without      Cardizem, she has been maintaining her rate in the 50s to 60s.  The      patient should confer with her primary care physician or her      cardiologist regarding resumption of medication.  3. Hyperlipidemia:  The patient will be continued on statin therapy      postdischarge.  4. Hypotension:  As above, the patient  was hypotensive when she was      admitted and we are holding Cardizem at discharge.  5. Chronic transaminitis:  Her elevated liver enzymes including AST of      51, ALT of 69 appear to be consistent with her previous levels.      Certainly, they are not at a level, which would advocate      discontinuing statin therapy.   LABORATORY DATA:  Of note:  1. Normal CBC with the exception of platelet count, which is decreased      at 118.  2. Mild hypokalemia at 3.3.  3. Elevated AST and ALT at 51 and 69 respectively.  4. Mild hypoalbuminemia with an albumin of 2.7.  5. Total cholesterol of 89 with an HDL of 39 and LDL of 33.  6. UA showing ketones but no evidence of infection.   STUDIES:  CT abdomen and pelvis showing hepatic embolization, closed,  which appears stable and evidence of a 1.5-cm renal cyst and stable  extrahepatic biliary dilatation.  The patient is status post  sphincterotomy, and there is no evidence of acute abnormality.   MEDICATIONS ON DISCHARGE:  1. Cardizem, which has been discontinued.  2. Plavix 75 mg p.o. daily.  3. Aspirin 81 mg p.o. daily.  4. Amiodarone 200 mg p.o. daily.  5. Multivitamin 1 tablet p.o. daily.  6. Vitamin E 400 two tablets p.o. daily.  7. Fish oil 2 tablets p.o. daily.  8. Cozaar 50 mg p.o. daily.  9. Claritin 10 mg p.o. daily.  10.Crestor 50 mg p.o. daily.   No labs or studies pending at the time of discharge.  The patient is in  stable condition and anxious for discharge.  Time spent 35 minutes.      Renee Ramus, MD  Electronically Signed     JF/MEDQ  D:  06/22/2008  T:  06/22/2008  Job:  [id number]   cc:   [name], M.D.

## 2010-09-26 ENCOUNTER — Ambulatory Visit (INDEPENDENT_AMBULATORY_CARE_PROVIDER_SITE_OTHER): Payer: Medicare Other

## 2010-09-26 DIAGNOSIS — J309 Allergic rhinitis, unspecified: Secondary | ICD-10-CM

## 2010-09-28 NOTE — Assessment & Plan Note (Signed)
[facility]                           GASTROENTEROLOGY OFFICE NOTE   NAME:Chermak, SHAMEEKA SILLIMAN                      MRN:          [id number]  DATE:01/31/2006                            DOB:          1925/08/17    PROBLEM:  Abdominal pain.   Mrs. Tomaselli has returned for scheduled GI followup.  She was hospitalized  in late August with abdominal pain.  ERCP did not demonstrate any retained  bile duct stones.  Course was complicated by mild pancreatitis which has  resolved.  The etiology of the pain was not exactly determined, though there  was suspicion for hepatic artery aneurysms.  She has been treated with coils  in the past.  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  She currently is  pain free and without GI complaint.   PHYSICAL EXAMINATION:  VITAL SIGNS:  Pulse 68, blood pressure 120/80.   IMPRESSION:  Abnormal liver tests with history of aneurysmal dilatation of  hepatic arteries and radicals, status post coil embolization.   RECOMMENDATIONS:  Follow up LFTs.  This ought to reflect her baseline LFTs  since she is pain free. If she has recurrent pain, I would consider repeat  arteriography.                                   Barbette Hair. [name], MD,FACG   RDK/MedQ  DD:  01/31/2006  DT:  02/02/2006  Job #:  [id number]   cc:   Merlene Laughter. Renae Gloss, M.D.

## 2010-09-28 NOTE — Assessment & Plan Note (Signed)
[facility]                           GASTROENTEROLOGY OFFICE NOTE   NAME:[name], Natasha GLOOR                      MRN:          [id number]  DATE:01/16/2006                            DOB:          1925/08/20    Telephone call 01/16/06.   [name] was in the hospital recently with abdominal pain, elevated  LFT's.  A CT angiogram did not demonstrate recurrent hepatic artery  aneurysm's that had been a problem in the past.  She had a dilated common  bile duct and I was unaware that she had previous sphincterotomy in 1999.  I  performed an ERCP for this and it showed previous sphincterotomy.  A balloon  sweep was negative for any stones.  She developed pancreatitis after the  ERCP.  Was discharged to home after a few days in the hospital and upon  calling her today, she is improving, a little bit weak, but no more  abdominal pain.   We had consulted with radiology during the hospitalization and they thought  since the CT scan did not show any further aneurysms (on the CT angio) that  an angiogram was not needed.   At this point it is not clear exactly what the cause of her symptoms were,  though in the past it has been her hepatic artery aneurysms.  They appeared  to be all successfully treated with coiling, etc. from previous  interventions.  I questioned whether or not she might have passed a stone in  the common bile duct and that it was not evident at the time of my ERCP.   Her liver chemistries are improving.  Her bilirubin is 2.3, alkaline phos is  253, AST 61, ALT 151 on 09/04.  This is all better than previous.   I have instructed her to call for an appointment with Dr. [name] who has  been her regular gastroenterologist for follow up.  She may need repeat  imaging, especially if she has recurrent pain, I think perhaps proceeding to  an angiogram would make sense if radiology agreed versus a dedicated CT  abdomen and pelvis with IV and  oral contrast.                                   Iva Boop, MD,FACG   CEG/MedQ  DD:  01/16/2006  DT:  01/16/2006  Job #:  [id number]   cc:   Barbette Hair. [name], MD,FACG  Merlene Laughter. Renae Gloss, M.D.

## 2010-09-28 NOTE — H&P (Signed)
[name]. [name]  Patient:    [name]                       MRN: [id number] Adm. Date:  [id number] Attending:  Mirian Mo CC:         [name], M.D.             [name], M.D. [facility]                         History and Physical  REFERRING PRIMARY CARE PHYSICIAN:  [name], M.D.  CARDIOLOGIST:  [name], M.D.  CURRENT COMPLAINTS:  "I feel weak this morning."  HISTORY OF PRESENT ILLNESS:  [name] is a 77 year old white female with a  prior history of paroxysmal atrial fibrillation but no history of coronary artery disease.  The patient had an admission in September of 2000 for atrial fibrillation with rapid ventricular response; however, during that hospitalization, she converted to a normal sinus rhythm.  The patient presents now to the emergency room after referral due to extreme weakness and fatigue, as well as an episode of presyncope this morning.  The patient stayed up for the last few days; she has not felt well.  She has decreased exercise tolerance.  She denies palpitations.  She did report some substernal chest pain which was rather erratic in nature, happening every couple of days, lasting a few minutes at a time.  There was no clear relationship, either with exertion or rest.  Patient did not take any medications to improve her chest pain symptoms nd they always abated spontaneously.  She has had no syncope over the last few weeks but has been feeling fatigued and weak upon standing.  The patient has regular followups in the office by Dr. Daleen Squibb.  Reportedly, she has been in normal sinus rhythm up until this admission when she now presents to the emergency room again with atrial fibrillation; however, now her heart rate is in the 40s and is associated with a relatively low blood pressure of 89/69.  On my  arrival in the emergency room, the patient is awake, alert and oriented with  a clear sensorium.  Her blood pressure is 90 systolic with a mean of 72 mmHg. She has no shortness of breath and she has no evidence of hypoperfusion.  PAST MEDICAL HISTORY: 1. Hypertension. 2. Hyperlipidemia. 3. Remote tobacco history; quit in 1983. 4. History of liver aneurysm, treated at [facility]. 5. The next chart reports a history of hypertrophic cardiomyopathy; however, on my    review of the 2-D echocardiogram, there appears to be no evidence of    hypertrophic cardiomyopathy.  She has mild asymmetrical septal hypertrophy. She    also has mild aortic sclerosis but no aortic stenosis. 6. Paroxysmal atrial fibrillation, on anticoagulation. 7. Negative workup for ischemic heart disease with a Persantine Cardiolite on    January 19, 1999 negative, with an ejection fraction of 72%.  ALLERGIES:  No known drug allergies.  MEDICATIONS: 1. Multivitamin tablet p.o. q.d. 2. Vitamin E 400 units a day. 3. Lipitor 10 mg p.o. q.h.s. 4. Coumadin 2.5 mg q.d. with 5 mg on Friday. 5. Claritin 10 mg p.o. q.d. 6. Calcium 5 mg every day. 7. Toprol XL half a tablet a day. 8. Tiazac 360 mg p.o. q.d.  SOCIAL HISTORY:  [place] and her husband.  Remote tobacco history.  Does not use alcohol.  FAMILY HISTORY:  Positive for coronary artery disease in both the father and mother but both died at an advanced age.  She does have a brother and sister, both who  have coronary disease, and a sister who is 10 years older than she is had bypass surgery at the age of 32.  REVIEW OF SYSTEMS:  The patient denies fever or chills.  She has a cough which s nonproductive.  She has mild shortness of breath.  She has no orthopnea or PND. No palpitations.  No nausea, vomiting or diarrhea.  No dysuria or frequency.  PHYSICAL EXAMINATION:  VITAL SIGNS:  Blood pressure is 89/65.  Heart rate is 40 to 52 beats  per minute. Temperature:  Afebrile.  GENERAL:  Well-nourished white female in no apparent distress, mentating normally.  HEENT/NECK:  No JVD or hepatojugular reflux.  Normal carotid upstroke.  No carotid bruits.  LUNGS:  Clear breath sounds bilaterally without any wheezing.  HEART:  Irregular rate and rhythm.  There is a normal S1 and S2.  I do not hear an S3.  There is a soft systolic murmur at the left upper sternal border.  Heart sounds overall are rather distant.  ABDOMEN:  Soft, nontender.  There is no rebound or guarding.  There are good bowel sounds.  EXTREMITIES:  Femoral pulses are palpable although faint.  There are no bruits.  There is no edema in the lower extremities.  Peripheral pulses again are palpable but faint, 1+ bilaterally.  There is no cyanosis.  There is no palpable cord. There is a negative Homans sign.  LABORATORY AND X-RAY FINDINGS:  Her 12-lead electrocardiogram shows atrial fibrillation with slow ventricular response, heart rate of 49 beats per minute,  septal infarct pattern with Q waves in V1 and V2, nonspecific ST and T wave changes.  Chest x-ray:  No cardiomegaly.  No infiltrates.  No evidence of congestive heart failure.  Labs:  Presently, only ______  is available with hemoglobin of 16.7, hematocrit  46, platelet count is 185,000.  Cardiac enzymes as well as electrolyte panel are pending.  ASSESSMENT AND PLAN: 1. Atrial fibrillation.  The patient has recurrent atrial fibrillation, now with    slow ventricular response, likely worsened by beta blocker and calcium channel    blockers; however, with her prior history of paroxysmal atrial fibrillation nd    now increased sensitivity to atrioventricular nodal agents, it is likely that    the patient has sick sinus node with possibly sub-nodal conduction system    disease.  We will hold the patients beta blocker and calcium channel blocker.    She may need cardioversion prior to  [name] discharge.  If there is evidence of    paroxysmal atrial fibrillation with rapid ventricular response, the patient, in     addition, may well need a pacemaker as she has associated hypotension with her    slow heart rate. 2. Hypotension.  This is likely related to a combination of blood pressure    medications and slow heart rate.  We will hold these for now.  She will get    intravenous fluids.  There is no evidence of congestive heart failure right ow.    We will obtain sputum and urine to make sure the patient does not have an    underlying infection. 3. Anticoagulation.  Will continue Coumadin, anticipating possible cardioversion  prior to [name] discharge. 4. Hyperlipidemia.  Will continue on her current medical regimen with Lipitor. DD:  06/13/99 TD:  06/13/99 Job: [id number]

## 2010-09-28 NOTE — Cardiovascular Report (Signed)
[place]  Patient:    [name], [name]                      MRN: [id number] Proc. Date: 11/16/99 Adm. Date:  [id number] Disc. Date: [id number] Attending:  Berry, Jonathan [place] CC:         Cardiac Catheterization Laboratory             Ammie Dalton, M.D.             [facility], [address] [place]                        Cardiac Catheterization  INDICATIONS:  [name] is a 77 year old white female with a history of paroxysmal atrial fibrillation.  She has had four episodes of PAF since February of this year which has spontaneously converted.  A 2-D echocardiogram was normal and Cardiolite was normal likewise.  She was recently awakened with substernal chest pressure and was found to be in AF which spontaneously converted while en route to the hospital by EMS.  Other problems include hypertension and hyperlipidemia for which she is on Norvasc and Lipitor.  She presents now for diagnostic coronary arteriography.  DESCRIPTION OF PROCEDURE:  The patient was brought to the second floor [facility] in the postabsorptive state.  She was premedicated with p.o. Valium.  Her right groin was prepped and shaved in the usual sterile fashion.  Xylocaine 1% was used for local anesthesia.  A 6 French sheath was inserted into the right femoral artery using standard Seldinger technique.  A 6 French right and left Judkins diagnostic catheter, as well as a 6 French pigtail catheter were used for selective coronary angiography, left ventriculography, distal abdominal aortography and, selective bilateral renal artery angiography.  Omnipaque dye was used for the entirety of the case.  Retrograde, aortic, left ventricular and pullback pressures were recorded.  HEMODYNAMICS: 1. Aortic systolic pressure 144, diastolic pressure 70. 2. Left ventricular systolic pressure 136 and diastolic pressure  18.  SELECTIVE CORONARY ANGIOGRAPHY: 1. Left main:  Normal. 2. Left anterior descending:  The LAD normal. 3. Left circumflex:  Normal. 4. Right coronary artery:  Dominant and normal.  LEFT VENTRICULOGRAPHY:  The RAO left ventriculogram was performed using 25 cc of Omnipaque dye at 12 cc per second.  The overall LVEF was estimated at greater than 60% without focal wall motion abnormalities.  Supravalvular aorta/aortic root appeared mildly dilated.  DISTAL ABDOMINAL AORTOGRAPHY:  Distal abdominal aortogram was performed using 30 cc of Omnipaque dye at 20 cc per second.  The renal arteries appeared to have bilateral fibromuscular dysplastic changes.  The infrarenal abdominal aorta and iliac bifurcation appear free of significant atherosclerotic change.  IMPRESSION:  Ms. Trickey has normal coronary arteries and normal left ventricular function.  I suspect her chest pain is simply related to her atrial fibrillation.  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  The sheath was removed and pressure was held on the groin to achieve hemostasis.  The patient left the lab in stable condition.  She will be discharged home today with a return office visit scheduled for approximately two weeks.  We will obtain outpatient renal Doppler studies to evaluate the psychologic significance of her FMD. DD:  11/16/99 TD:  11/17/99 Job: [id number]

## 2010-09-28 NOTE — Op Note (Signed)
[name], [name]               ACCOUNT NO.:  [id number]   MEDICAL RECORD NO.:  [id number]          PATIENT TYPE:  AMB   LOCATION:  DSC                          FACILITY:  [facility]   PHYSICIAN:  Claude Manges. Whitfield, M.D.DATE OF BIRTH:  [birth year]   DATE OF PROCEDURE:  02/21/2006  DATE OF DISCHARGE:  02/21/2006                                 OPERATIVE REPORT   PREOPERATIVE DIAGNOSIS:  Displaced right olecranon fracture.   POSTOPERATIVE DIAGNOSIS:  Displaced right olecranon fracture.   PROCEDURE:  Open reduction internal fixation surgery.   SURGEON:  Norlene Campbell, M.D.   ASSISTANT:  Arlys John D. Petrarca, P.A.-C.   ANESTHESIA:  Axillary block with IV sedation.   COMPLICATIONS:  None.   HISTORY:  A 77 year old female who fell about 6 days ago landing on her  right elbow, sustaining a displaced right olecranon fracture.  She was  initially evaluated in the emergency room with a splint applied.  Follow up  x-rays confirmed the displaced fracture perhaps 5 or 6 mm in width and she  is now scheduled for open reduction and internal fixation.   PROCEDURE:  With the patient comfortable on the operating table the right  upper extremity was placed in an arm tourniquet.  The arm was then prepped  with DuraPrep from the tips of the fingers to the tourniquet.  The patient  had an axillary nerve block prior to the patient coming to the operating  room with excellent results.  Sterile draping was performed with the  extremity still elevated.  The Esmarch was applied and the tourniquet  elevated to 250 mmHg.   A longitudinal incision was made over the olecranon extending along the  radial border of the olecranon about 3 inches in length and via sharp  dissection carried down to the subcutaneous tissue.  The fascia over the  olecranon and proximal ulna was then incised sharply exposing the bone.  The  fracture was about an inch distal to the tip of the olecranon and was easily  reducible. I  elected to use the [employer] olecranon plate.  The plate was then  applied to the olecranon as the two teeth  on either side of the plate were  then impacted into the olecranon and maintaining the fracture reduced a  single 3.5 mm screw was then placed along the proximal ulna.  The long  cancellus oblique screw was then placed in the very tip of the olecranon  plate coursing towards the proximal ulna.  This was drilled, measured 45 mm  and then inserted with an excellent compression.  Films at that point  revealed absence of the fracture line and very nice position of the first  two inserted screws ice.  A second screw was then placed through one of the  holes at the olecranon proximal to the fracture.  We initially measured a 20  mm screw and on image intensification felt that it was just too long and  accordingly we reinserted a 16 mm screw.  It was fully threaded cancellus  with excellent purchase.  Two further screws were then placed  into the slots  in the ulna distal to the fracture through the plate.  These were cortical  screws inserted after appropriate drilling and measuring.  We had perfect  stability.  I felt that no further screws were necessary.  Image  intensification revealed excellent position of the screw in the plate.   The wound was copiously irrigated with saline solution.  The muscle fascia  was closed over the plate with running 2-0 Vicryl. The skin was then closed  with skin clips.  0.25% Marcaine with epinephrine injected along the wound  edges.  A sterile bulky dressing was applied followed by posterior splint.  Tourniquet was deflated with immediate capillary refill to the fingers.   The patient tolerated without complications.  Plan Vicodin for pain.  Office  1 week.      Claude Manges. Cleophas Dunker, M.D.  Electronically Signed     PWW/MEDQ  D:  02/21/2006  T:  02/24/2006  Job:  [id number]

## 2010-09-28 NOTE — Discharge Summary (Signed)
[name], [name]               ACCOUNT NO.:  [id number]   MEDICAL RECORD NO.:  [id number]          PATIENT TYPE:  INP   LOCATION:  3709                         FACILITY:  [facility]   PHYSICIAN:  Iva Boop, MD,FACGDATE OF BIRTH:  1926-03-19   DATE OF ADMISSION:  01/08/2006  DATE OF DISCHARGE:  01/13/2006                                 DISCHARGE SUMMARY   PATIENT'S ADMITTING DIAGNOSES:  1. History of hepatic artery aneurysm, rule out recurrent hepatic artery      aneurysm.  2. Elevated transaminases.  3. Paroxysmal atrial fibrillation which is an ongoing problem.  4. Hypertension.  5. Hyperlipidemia.   DISCHARGE DIAGNOSES:  1. Abdominal pain with elevated transaminases.  No CT evidence for new or      recurrent hepatic artery aneurysm.  Symptoms and findings possibly      secondary to sphincter of Oddi dysfunction.  2. Post endoscopic retrograde cholangiopancreatography pancreatitis.  3. Volume depletion, corrected.  4. Paroxysmal atrial fibrillation.  5. Hypertension.  6. Hyperlipidemia.  7. Endoscopic retrograde cholangiopancreatography on August31,2007.      Prior sphincterotomy noted but not known prior to this endoscopic      retrograde cholangiopancreatography exam.  Apparently, she had an      endoscopic retrograde cholangiopancreatography in the 1990s.  There was      good biliary drainage and sphincter of Oddi dysfunction or retained      stones seemed very unlikely given this adequate drainage and prior      sphincterotomy.  There was pain noted during this study on manipulation      of the papilla.  Dr. Marvell Fuller feeling was that she was having pain      from recurrent problems with aneurysms, though common bile duct stone      and passage of stone remained a possible cause for her problems.      Common bile duct diffusely dilated with maximum diameter 12 mm.  8. Thrombocytopenia, noncritical.   CONSULTATIONS:  None.   PROCEDURE:  ERCP by Dr. Leone Payor as  above.   BRIEF HISTORY:  Mrs. [name] is a 77 year old woman has had a history of  episodic abdominal pain in the past and these have been attributed to  hepatic artery aneurysms.  In November2004, she underwent embolization of  the hepatic artery.  She is also status post cholecystectomy.  She presented  to the emergency room on August29, 2007 with pain and nausea very similar  to prior incidences when she required the embolization of her hepatic  artery.  This pain was in the central abdomen and nonradiating.  It had  occurred suddenly while drinking her morning coffee.  She did not experience  vomiting.  She did have loose stools on the night prior to these symptoms.  She had similar symptoms with pain for about 4 hours about a week prior to  this on presentation to the emergency room.   The patient was admitted to the hospital by Dr. Leone Payor for further  evaluation.  He suspected recurrent problem with hepatic artery aneurysm as  the etiology for her symptoms.  LABORATORY:  Initial hemoglobin was 16.1, 13.7 at discharge.  Hematocrit  47.3 initially, 39.8 at discharge.  MCV 97.6.  White blood cell count  maximum 11.2, at discharge it was 6.5.  Platelets ranged from 183 on  admission to 129 at discharge.  The differential was within normal limits.  Sodium 141, potassium 4.5, glucose 122, BUN 11, creatinine 1.2.  Calcium  8.0, total protein 5.1.  Albumin 2.6.  Total bilirubin initially 1.5,  reached a high of 6.6 and measured 3.1 at discharge.  Alkaline phosphatase  maxed out at 345 on September3, 2007.  AST initially 168, maxed at 981,  87 at discharge.  ALT maxed at 772, 198 at discharge.  Amylase was 805,  lipase went from a high of 2809 to 194 at discharge.  The CKs were 67, 22,  37.  CK-MB is 1.3, 1.1, 1.2.  Troponin I 0.02, 0.04 and 0.02.  Urinalysis  showed a small amount of bilirubin, 15 mg ketones, 30 mg protein, small  amount of leukocyte esterase and 3-6 white blood cells.   Urine culture  multiple colonies 50,000 in number.   IMAGING STUDIES:  The ERCP distended common bile duct without definite  filling defects.  Two-view chest:  COPD with question of left lower lobe  infiltrate versus atelectasis seen only on lateral view.  CT angiogram of  the abdomen:  Numerous coils seen in the porta hepatis and right hepatic  lobe.  No evidence of recurrent or new hepatic artery aneurysms.  Celiac,  SMA, IMA and renal arteries widely patent.   HOSPITAL COURSE:  Problem 1:  ABDOMINAL PAIN:  Rule out recurrent hepatic  artery aneurysm.  The CT scan failed to demonstrate any recurrent hepatic  artery aneurysm.  Her LFTs went up significantly and she underwent the ERCP  with findings noted above.  Past history of ERCP was not discovered until  prior sphincterotomy was found at ERCP.  After the ERCP, Dr. Leone Payor felt  that possibly she was having recurrent problems from her aneurysms despite  the negative CT, or that she possibly could have passed a common duct stone  that caused her symptoms and her LFT elevations.  Following the ERCP, she  did develop pancreatitis which delayed her discharge home for a couple of  days.  However, after initially having some nausea and abdominal discomfort  secondary to pancreatitis, her symptoms did improve and she was discharged  to home having tolerated a low-fat diet.   Problem 2:  VOLUME DEPLETION:  The patient did not have any renal  insufficiency associated with this but she was somewhat hypotensive with  blood pressures in the emergency room of 116/49 and pulse in the 60s.  Following hydration and adequate pain control, she became normotensive and  in fact blood pressures systolic blood pressures reached as high as 167.   Problem 3:  ISOLATED FEVER:  The patient initially did have a temperature of  100.  She did not receive regular antibiotics but did receive Unasyn just prior to her ERCP.  She had no further temperatures  following that initial  temperature of 100.   Problem 4:  ELEVATED HEMOGLOBIN/POLYCYTHEMIA:  Following hydration, the  hemoglobin went from 16.1 down to about 14.5 and final measurement was 13.7.  This elevated hemoglobin was felt secondary to hemoconcentration from  volumes deficit and responded well to IV fluids.   Problem 5:  STATUS POST ERCP PANCREATITIS:  Details are described above.   Condition at discharge was  improved.   MEDICATIONS AT DISCHARGE:  1. Vicodin 5/500 1-2 every 4-6 hours as needed for pain.  2. Amiodarone 200 mg daily.  3. Aspirin 81 mg daily.  4. Calcium 500 mg daily.  5. Cardizem 120 mg daily.  6. Clarinex 10 mg daily.  7. Cozaar 50 mg daily.  8. Fish oral 2 capsules daily.  9. Multivitamin once daily.  10.Plavix 37.5 mg daily.  11.Crestor 5 mg daily.   FOLLOW-UP OFFICE VISIT:  She is to return on September4, 2007 to the  [place] office for lab tests and follow up on her LFTs and Dr. Leone Payor will  call her with the results of these tests.     ______________________________  Jennye Moccasin, PA-C      Iva Boop, MD,FACG  Electronically Signed    SG/MEDQ  D:  02/07/2006  T:  02/10/2006  Job:  [id number]   cc:   [name], M.D.  Iva Boop, MD,FACG

## 2010-09-28 NOTE — Discharge Summary (Signed)
[place]. [facility]  Patient:    [name]                       MRN: [id number] Adm. Date:  [id number] Disc. Date: [id number] Attending:  Mirian Mo Dictator:   Leonides Cave, P.A. CC:         Jesse Sans. Wall, M.D. [facility]             Aura Fey, M.D., Primary Care                           Discharge Summary  DATE OF BIRTH:  05-06-1926  ADMITTING PHYSICIAN:  Lewayne Bunting, M.D.  DISCHARGE DIAGNOSES: 1. Intermittent atrial fibrillation. The patient in normal sinus rhythm at    time of discharge. 2. Urinary tract infection. The patient discharged home on Cipro 500 mg    b.i.d. at time of discharge. 3. Hypotension/bradycardia, felt likely due to combination of blood pressure    medications. Improved at time of discharge. 4. History of hypertension. 5. History of hyperlipidemia. 6. History of remote tobacco abuse. Quit in 1983. 7. History of liver aneurysm treated at [facility]. 8. Long history of paroxysmal atrial fibrillation on Coumadin therapy. 9. Negative history of ischemic heart disease with a negative persantine    Cardiolite in September of 2000 revealing an ejection fraction of 72% and    no ischemia.  BRIEF HISTORY OF PRESENT ILLNESS:  The patient is a 77 year old white female with a history as outlined above. The patient presented to the emergency department after having extreme weakness and fatigue, as well as an episode of presyncope on the morning of admission, June 13, 1999. She has recently had a decreased exercise tolerance and has not felt well the past few days. The patient denies palpitations. The patient does report some substernal chest pain which was rather erratic in nature happening every couple of days, lasting a few minutes at a time. There is no relationship either with exertion or rest. The patient did not take any medications to improve her chest pain symptoms, and they always abated spontaneously.  The patient has had no syncope over the last few weeks but has been feeling fatigued and weak upon standing. In the emergency department, the patient was seen and admitted for atrial fibrillation with slow ventricular response. The patients beta blockers and calcium channel blockers were held, and she was admitted to be observed on telemetry unit to see if she demands a need for a possible pacemaker for the possibility of sick sinus node.  HOSPITAL COURSE:  The patients blood pressure and heart rate responded very well to the holding of nodal blocking agents. June 15, 1999, the patient was found to be in normal sinus rhythm and was felt in stable enough condition to be discharged home with close outpatient follow-up. She, however, was found to have a UTI with greater than 100,000 gram-negative rods and was started on Cipro 500 b.i.d.  DISCHARGE MEDICATIONS: 1. Toprol XL 50 mg q.d. 2. Cipro 500 mg b.i.d. for seven days. 3. Coumadin 2.5 mg q.d., except for Friday in which she takes 5 mg. This is    the same regimen as taken at home. 4. She will continue Lipitor 10 mg q.d. 5. Claritin 10 mg q.d. 6. She was instructed to stop her Tiazac for the time being.  DIET:  She will continue a  low-fat, low-cholesterol diet.  INSTRUCTIONS:  She will have her protime and INR checked on Monday at the Coumadin clinic at the [facility] cardiology office.  FOLLOW-UP:  She will follow up with the Dr. Maisie Fus C. Walls P.A. on February 12 at noon at the [facility] cardiology office in [place]. DD:  08/06/99 TD:  08/06/99 Job: [id number]

## 2010-09-28 NOTE — Discharge Summary (Signed)
[place]. [facility]  Patient:    [name], [name]                      MRN: [id number] Adm. Date:  [id number] Disc. Date: [id number] Attending:  Berry, Jonathan [place] Dictator:   Mancel Bale, P.A. CC:         Ammie Dalton, M.D.                           Discharge Summary  ADMISSION DIAGNOSES: 1. History of paroxysmal atrial fibrillation. 2. History of hypertension. 3. History of hyperlipidemia. 4. History of normal coronary arteries by past cardiac catheterization. 5. Recurrent episode of atrial fibrillation. 6. History of liver aneurysm treated at [facility].  DISCHARGE DIAGNOSES: 1. History of paroxysmal atrial fibrillation. 2. History of hypertension. 3. History of hyperlipidemia. 4. History of normal coronary arteries by past cardiac catheterization. 5. Recurrent episode of atrial fibrillation. 6. History of liver aneurysm treated at [facility]. 7. Status post loading on Sotalol with conversion to normal sinus rhythm.  HISTORY OF PRESENT ILLNESS:  [name] is a 77 year old, married, white female with a history of paroxysmal atrial fibrillation, hypertension, hyperlipidemia, and normal coronary arteries by catheterization in the past. She was incidentally noted to have FMD at the time of catheterization.  She developed paroxysmal atrial fibrillation last on the night prior to admission with weakness, fatigue, and shortness of breath.  No chest pain.  She then presented to [facility] ER.  It was noted that her PAF usually converts spontaneously to normal sinus rhythm within 24-48 hours.  At that time, EKG revealed atrial fibrillation with controlled ventricular response.  Chest x-ray showed no acute disease.  Laboratories are pending. Her exam is essentially benign, other than her heart being in irregular rhythm.  Her blood pressure wass 130/80, her pulse wa 90-100.  At that time, she was planned for admission.  We  would check INR and electrolytes.  We planned to stop the Toprol and start a low dose of Betapace at that time.  We would monitor her and if she did not convert chemically, we would consider heart direct-current cardioversion after adequate loading of Betapace.  HOSPITAL COURSE:  On March 03, 2000, [name] was feeling better.  Her INR was at 2.7.  The TSH was normal at 3.550.  The electrolytes were all normal including magnesium.  Cardiac enzymes were negative x 3.  At that time, she remained in atrial fibrillation with controlled ventricular response.  Her INR had been therapeutic.  At that time, we had planned to decrease her Cardizem dose from 180 mg to 120 mg a day.  We would increase the Betapace from 40-80 b.i.d.  We would plan for her TEE guide at direct-current cardioversion the following day if she did not pharmacologically convert by then.  On the morning of March 04, 2000, [name] had converted to normal sinus rhythm at approximately 6:31 that morning.  At that point, she was remaining in normal sinus rhythm at 62 beats per minute.  At that time, she was afebrile at 96.7, pulse 70, blood pressure 135/77, oxygen saturation 93% on room air. The heart exam remained benign.  At that time, it was felt that she was stable for discharge home on the current medications that we had her on.  HOSPITAL CONSULTS:  None.  HOSPITAL PROCEDURES:  None.  HOSPITAL LABORATORIES:  On  March 03, 2000, her INR was 2.7.  Electrolytes showed sodium 137, potassium 4.3, BUN 16, creatinine 0.9, glucose 104, magnesium 1.9.  The TSH was normal at 3.550.  Cardiac enzymes were negative x 3 with CKs of 43, 49, and 65, MBs 1.2, 1.9, and 1.4.  Troponin was 0.01, 0.01, and 0.01.  The EKG on admission revealed atrial fibrillation at 82 beats per minute.  The EKG at the time of discharge showed normal sinus rhythm.  RADIOLOGY:  Chest x-ray on March 02, 2000, shows no acute  disease.  DISCHARGE MEDICATIONS: 1. Coumadin 5 mg tablets:  She was to take 1/2 tablet on Sunday, Monday,    Tuesday, Thursday, and Friday, and 1/4 of a tablet on Wednesday and    Saturday.  This is the same as her previous dosing prior to her    hospitalization. 2. Lipitor 10 mg 1 p.o. q.d. 3. Cardizem CD 120 mg 1 p.o. q.d. 4. Betapace 80 mg p.o. b.i.d. 5. Claritin 10 mg q.d. 6. Fosamax 1 tablet each week. 7. Multivitamins, calcium, and vitamin E as before. 8. Stop taking Toprol.  DIET:  Low salt, low fat, low cholesterol diet.  Call the office if any palpitations, light-headedness, or shortness of breath.  Have blood drawn in two weeks to check a PT INR.  FOLLOW-UP:  Follow up with Dr. Allyson Sabal on November 7, at 11:30 in the morning. DD:  03/10/00 TD:  03/11/00 Job: [id number]

## 2010-09-28 NOTE — Discharge Summary (Signed)
. [facility]  Patient:    [name], [name]                      MRN: [id number] Adm. Date:  [id number] Disc. Date: [id number] Attending:  Berry, Jonathan [place] Dictator:   Halford Decamp. Delanna Ahmadi, R.N., N.P. CC:         Ammie Dalton, M.D.   Discharge Summary  HISTORY OF PRESENT ILLNESS:  Ms. Reylynn Vanalstine is a 77 year old married white female patient of Runell Gess, M.D. who came into the hospital for antiarrhythmic loading.  She apparently has had paroxysmal atrial fibrillation with breakthrough on Betapace.  This was stopped prior to admission and she was started on amiodarone 400 mg p.o. t.i.d. on admission.  HOSPITAL COURSE:  Her INR had been therapeutic at 3.0.  She has a history of normal coronary arteries and normal ejection fraction.  During her hospitalization, her PFTs were DLCO2 and her TFTs were all reviewed.  She did also undergo echocardiogram.  She converted to sinus rhythm on her first day of her amiodarone.  Her Toprol was also discontinued secondary to her slow ventricular response.  During her hospitalization, her Cardizem was also discontinued secondary to low heart rate.  She was also thought to have a prolonged QT interval.  Her amiodarone was held and then the dosage decreased, however, she reverted back to atrial fibrillation.  It was then thought that her QT was not really elevated.  The EKG machine was reading it wrong, thus she was started back on her amiodarone at 400 mg b.i.d.  On June 16, 2000, she was considered stable.  She was in sinus rhythm on her amiodarone and was to be discharged home.  Her blood pressure did elevate off the Toprol and Cardizem, so at the time of discharge Acion 4 mg everyday was started.  LABORATORY DATA:  Hemoglobin 15.3, hematocrit 44.2, WBC 10.6, platelets 201. Her INR ranged from 2.8 to 3.0.  Her sodium was 136, potassium 4.5, BUN 14, creatinine 0.9, glucose 94.  Albumin 3.4, AST 33,  ALT 29.  Magnesium was 2.7, TSH 2.0, PFTs were all within normal limits.  DISCHARGE MEDICATIONS: 1. Lipitor 10 mg once per day. 2. Amiodarone 400 mg per day x 2 weeks and then 200 mg per day. 3. Coumadin 2.5 mg everyday except 1.25 mg on Monday, Wednesday, Friday. 4. Acion 4 mg per day.  Also to note while in the hospital she received about 5500 mg of amiodarone.  DISCHARGE DIAGNOSES: 1. Paroxysmal atrial fibrillation with breakthrough on Betapace, admitted for    antiarrhythmic initiation and loading of amiodarone. 2. Paroxysmal atrial fibrillation with slow ventricular response, Toprol and    Cardizem discontinued. 3. Hypertension, Acion added during hospitalization. 4. Hyperlipidemia. 5. Normal left ventricular function. DD:  07/15/00 TD:  07/16/00 Job: [id number]

## 2010-09-28 NOTE — Assessment & Plan Note (Signed)
[facility]                         GASTROENTEROLOGY OFFICE NOTE   NAME:PASCHALLJudyth, [name]                      MRN:          [id number]  DATE:06/23/2006                            DOB:          01/06/1926    [name] is here to schedule followup colonoscopy. He has a history  of colon polyps and was last examined in 2005, where several adenomatous  polyps were removed. [name] currently has no GI complaints. 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 She also has a  remote history of atrial fibrillation for which she is on amiodarone and  Plavix.   Other medications include:  1. Cardizem.  2. Cozaar.  3. Crestor.  4. Clarinex.  5. Baby aspirin.  6. Fosamax.   She had a liver test in September 2007, was normal.   PHYSICAL EXAMINATION:  Pulse 60, blood pressure 140/78, weight 166.   PHYSICAL EXAMINATION:  HEENT: EOMI. PERRLA. Sclerae are anicteric.  Conjunctivae are pink.  NECK:  Supple without thyromegaly, adenopathy or carotid bruits.  CHEST:  Clear to auscultation and percussion without adventitious  sounds.  CARDIAC:  Regular rhythm; normal S1 S2. She has a 1-2/6 early systolic  murmur at the left sternal border. ABDOMEN:  Bowel sounds are  normoactive.  Abdomen is soft, non-tender and non-distended.  There are  no abdominal masses, tenderness, splenic enlargement or hepatomegaly.  EXTREMITIES:  Full range of motion.  No cyanosis, clubbing or edema.  RECTAL:  Deferred.   IMPRESSION:  1. History of multiple adenomatous polyps.  2. Hepatic artery aneurysm, status post ampullization.  3. History of atrial fibrillation.   RECOMMENDATIONS:  Followup colonoscopy.  Plavix will be held for five  days.     Barbette Hair. [name], MD,FACG  Electronically Signed    RDK/MedQ  DD: 06/23/2006  DT: 06/23/2006  Job #: [id number]   cc:   Nanetta Batty, M.D.  [name], M.D.

## 2010-09-28 NOTE — Assessment & Plan Note (Signed)
[facility]                               PULMONARY OFFICE NOTE   NAME:Schinke, TEYA OTTERSON                      MRN:          [id number]  DATE:01/30/2006                            DOB:          May 25, 1925    PULMONARY/ALLERGY FOLLOWUP:   PRIMARY CARE PHYSICIAN:  [name], M.D.   CARDIOLOGIST:  Nanetta Batty, M.D.   PROBLEM:  1. Allergic rhinitis.  2. Eustachian dysfunction.  3. Atrial fibrillation.   HISTORY:  She returns for a one-year followup, continued vaccine here at  1:10 with no problems.  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  Amiodarone controls her atrial  fibrillation.  She had her chest x-ray last week at [facility] and we are looking up that report.  She says this year has been  worst for postnasal drip, but she has not been needing her nasal spray and  she has not had wheezing.   MEDICATIONS:  1. Amiodarone 200 mg.  2. Cardizem 120 mg.  3. Cozaar 50 mg.  4. Crestor 5 mg.  5. Clarinex 10 mg.  6. Plavix 75 mg.  7. Aspirin 81 mg.  8. Calcium.  9. Multivitamin with fish oil.   ALLERGIES:  No known drug allergies.   OBJECTIVE:  VITAL SIGNS:  Weight 164 pounds, blood pressure 104/58, pulse  regular at 61, room air saturation 93%.  HEENT:  There is no edema.  Nasal airway and pharynx are clear.  LUNGS:  Clear to P&A.  HEART:  Sounds are regular without murmur or gallop.  Pulse felt regular to  my examination with no murmur.   IMPRESSION:  Allergic rhinitis with mild symptomatic flare, but fairly good  response to allergy vaccine.   PLAN:  1. Claritin daily p.r.n. as an antihistamine.  2. Continue allergy vaccine at 1:10.  3. Nasonex, one or two sprays in each nostril daily during symptomatic      season.  4. Schedule a return for allergy vaccine.  5. Follow up in one year  or earlier p.r.n.       Clinton D. Maple Hudson, MD, FCCP, FACP      CDY/MedQ  DD:  02/05/2006  DT:  02/07/2006  Job #:  [id number]   cc:   [name], M.D.

## 2010-09-28 NOTE — H&P (Signed)
NAME:  [name], [name]                         ACCOUNT NO.:  [id number]   MEDICAL RECORD NO.:  [id number]                   PATIENT TYPE:  EMS   LOCATION:  MINO                                 FACILITY:  [facility]   PHYSICIAN:  Corinna L. Lendell Caprice, MD             DATE OF BIRTH:  [birth year]   DATE OF ADMISSION:  03/31/2003  DATE OF DISCHARGE:                                HISTORY & PHYSICAL   CHIEF COMPLAINT:  Abdominal pain.   HISTORY OF PRESENT ILLNESS:  [name] is a 77 year old white female who  presents to the emergency room with a several-hour history of acute-onset  severe cramping upper abdominal pain.  She also vomited bilious emesis at  home and then again here in the emergency room.  She is currently feeling  better after getting morphine and antiemetics, but she still has some  epigastric pain.  She has not had any fevers, chills or diarrhea; no sick  contacts.  Apparently, the pain was so severe, she was crying.  She reports  that she felt this once before and the etiology was not clear, despite what  sounds like extensive workup in [place].   PAST MEDICAL HISTORY:  1. Liver aneurysm requiring a procedure at [facility].  2. Hiatal hernia.  3. Hypertension.  4. Paroxysmal atrial fibrillation.  5. Hyperlipidemia.   MEDICATIONS:  1. Cozaar 50 mg p.o. daily.  2. Aspirin 81 mg p.o. daily.  3. Amiodarone 200 mg p.o. daily.  4. Clarinex.  5. Lipitor 10 mg p.o. daily.  6. Plavix 75 mg p.o. daily.  7. Fosamax 70 mg p.o. q.wk.  8. Calcium 500 mg p.o. b.i.d.  9. Vitamin E.  10.      Multivitamin.   SOCIAL HISTORY:  She is married.  She is here with her son and husband.  She  does not smoke or drink.   FAMILY HISTORY:  Family history is noncontributory.   REVIEW OF SYSTEMS:  CONSTITUTIONAL:  No weight loss.  No fevers or chills.  HEENT:  No headache.  No dysphagia or odynophagia.  RESPIRATORY:  No cough  or shortness of breath.  CARDIOVASCULAR:  No chest  pains or palpitations.  GI:  As above.  GU:  No dysuria or hematuria.  MUSCULOSKELETAL:  No  arthralgias or myalgias.  SKIN:  No rash.  PSYCHIATRIC:  No depression.  NEUROLOGIC:  No seizures.  ENDOCRINE:  No diabetes.  HEMATOLOGIC:  No  history of thromboses.   PHYSICAL EXAMINATION:  VITAL SIGNS:  On physical examination, her  temperature was 97.4, blood pressure 170/70, pulse 86, respiratory rate  initially was 30, now is 16, oxygen saturation 97% on room air.  GENERAL:  In general, the patient initially was crying per ER physician; now  she appears comfortable.  HEENT:  Normocephalic, atraumatic.  Her right pupil is slightly larger than  her left.  Both are reactive.  Moist mucous  membranes.  Oropharynx was  without erythema or exudate.  NECK:  Neck is supple.  No lymphadenopathy, no thyromegaly or carotid  bruits.  LUNGS:  Lungs are clear to auscultation bilaterally without wheezes, rhonchi  or rales.  CARDIOVASCULAR:  Regular rate and rhythm without murmurs, gallops or rubs.  ABDOMEN:  Normal bowel sounds.  Soft, nontender and nondistended.  GU AND RECTAL:  Deferred.  EXTREMITIES:  No clubbing, cyanosis, or edema.  SKIN:  No rash.  PSYCHIATRIC:  Normal affect.  NEUROLOGIC:  Alert and oriented.  Cranial nerves and sensorimotor exam are  intact with the exception of slightly larger pupil on the right.   LABORATORIES:  White blood cell count is 13,000 with 83% neutrophils, 11%  lymphocytes; the rest of her CBC is normal.  Electrolytes are essentially  normal.  Her bilirubin is elevated at 1.3, direct bilirubin of 0.7, alkaline  phosphatase elevated at 185, SGOT 239, SGPT 111, albumin normal, lipase  slightly elevated at 54.  UA shows small bilirubin, 15 ketones, negative  blood, negative protein, nitrite negative, trace leukocyte esterase, 0 to 2  white cells, rare bacteria.   CT of the abdomen with IV contrast only shows no aneurysm.  There are  hepatic coils.   EKG shows  normal sinus rhythm with left axis deviation and prolonged Q-T  interval.   ASSESSMENT AND PLAN:  1. Abdominal pain with vomiting.  Cause is uncertain.  I will repeat the CAT     scan with p.o. contrast in the morning.  She will get clears for now and     pain medications, proton pump inhibitor, antiemetics.  2. Increased liver function tests, possibly secondary to Lipitor versus     amiodarone; also need to consider retained stone.  She is status post     cholecystectomy, but this was many years ago, however.  I will also ask     for records from [facility] regarding this aneurysm and ask for any     laboratories as well.  3. Hypertension.  Continue her outpatient medications.  4. Paroxysmal atrial fibrillation, currently in sinus rhythm.  5. Hyperlipidemia.  Her Lipitor will be held.                                                Corinna L. Lendell Caprice, MD    CLS/MEDQ  D:  03/31/2003  T:  03/31/2003  Job:  [id number]   cc:   Merlene Laughter. Renae Gloss, M.D.  [address]  [address]  [place]  [place] [address]  Fax: [phone]

## 2010-09-28 NOTE — Discharge Summary (Signed)
NAME:  [name], [name]                         ACCOUNT NO.:  [id number]   MEDICAL RECORD NO.:  [id number]                   PATIENT TYPE:  INP   LOCATION:  5153                                 FACILITY:  [facility]   PHYSICIAN:  Melissa L. Ladona Ridgel, MD               DATE OF BIRTH:  [birth year]   DATE OF ADMISSION:  03/30/2003  DATE OF DISCHARGE:  04/06/2003                                 DISCHARGE SUMMARY   ADMISSION DIAGNOSIS:  Bilious vomiting.   DISCHARGE DIAGNOSES:  1. Aberrant hepatic artery status post embolization.  2. Transaminasemia, resolving.  3. Hypertension.  4. Paroxysmal atrial fibrillation.  5. Hyperlipidemia.   HISTORY OF PRESENT ILLNESS:  The patient is a 76 year old white female who  presented to the emergency room on November 18 with severe crampy upper  abdominal pain and bilious vomiting.  She was treated with antiemetics and  pain medications and did not show any fever, chills, or diarrhea suggestive  of a bacterial etiology.  She was admitted to the hospital for further  evaluation.   HOSPITAL COURSE:  On admission, her liver function tests were found to be  abnormally elevated, the origin of which was conjectured  to possibly be  related to Lipitor or amiodarone; however, her past medical history for  hepatic aneurysm also became suspect for source.  At the time of admission,  the patient, despite having a history of paroxysmal atrial fibrillation, was  in sinus rhythm.  She underwent CT of abdomen and pelvis with IV contrast  and no p.o. contrast which showed gas in the biliary tree but no obvious  aneurysm.  A complicating piece of information on admission showed one  positive blood culture for E. coli and a positive urine culture for Proteus.  The patient was started on ciprofloxacin for treatment of her UTI, and two  separate blood cultures remained negative for any growth supporting an E.  coli bacteremia.   During the course of hospitalization, she  remained afebrile.  The patient  underwent an abdominal ultrasound with vascular Doppler which showed no  biliary duct dilatation; however, the common bile duct was mildly dilated at  0.2 mm.  The previously placed embolization coil was not evident on Doppler  ultrasound.  The core Doppler demonstrated hepatopetal flow in the portal  vein and hepatofugal flow in the hepatic vein.  The splenic vein was patent  but could not be visualized in its full extent.  There were no focal liver  lesions.  There was no suggestion of pneumobiliary that was identified on  the CT scan.  The spleen, kidneys, and liver were visualized, and the  pancreas was deemed unremarkable.   GI was consulted and evaluated the patient.  They reviewed her old records  from [place].  The patient was started on Unasyn for broad-spectrum coverage of  her abdomen.  On November 19, the patient was noted to be  febrile at 100 at  which time the culture results showing Klebsiella UTI were observed.  Ciprofloxacin was added as a result of this finding.  By November 20, the  patient stated that her abdominal pain had improved.  She was afebrile at  this time, and it was determined the patient would require CT and  angiographic evaluation versus traditional angiographic evaluation of her  celiac access and SMA.  Over the course of these days, transaminase was  declining.   On November 22, the patient underwent vascular interventional study viewing  her celiac SMA aortogram. She was found to have a right hepatic artery  aneurysm most likely increased since her previous study in 1999.  It was  determined the patient would be a candidate for a two-stage procedure to  embolize the growing aneurysm.   On November 23, the patient underwent a repeat coiling procedure to the  right CFA.  The groin was closed with Perclose without complications.  The  patient tolerated the procedure well without complications.  She did  complain of mild  pain at the site.  However, on the day of discharge, her  examination revealed pupils equal, round, and reactive to light.  Extraocular movements intact.  She was anicteric.  Mucous membranes moist,  chest clear to auscultation.  Cardiovascular regular rate and rhythm with  distant heart sounds.  Abdomen was mildly tender in the left side as the  patient suggested likely secondary to her need to move her bowels.  The  right groin site was clean, dry, and intact.  There was a Steri-Strip over  the puncture site without evidence for possible bleeding.  There was no  bruit.  The patient had 2+ pulses in the distal extremities and no edema,  and so was able to ambulate without dysfunction, and Tylenol was sufficient  to cover her pain.   ASSESSMENT/PLAN:  This is a 77 year old white female admitted with acute  hepatic injury, found to have a right hepatic artery aneurysm increasing in  size since her last procedure.  She underwent a hepatic artery coiling via  percutaneous approach with interventional radiology.  She tolerated her  procedure well and is scheduled to follow up as outpatient.   1. Gastrointestinal.  The patient's liver enzymes had been trending down.     At the time of discharge, her last set of enzymes revealed an AST of 42,     ALT 46, alkaline phosphatase 223, total bilirubin 0.5.  Repeat CBC     revealed previous leukocytosis of 13,000 noted on November 17 which had     peaked at 16,000 on November 18 which resolved.  Her white count on     discharge was 8.1 with a hemoglobin of 13.4 and hematocrit 38.6,     platelets 171.  The patient was instructed to follow up with Dr. [name]     for her GI.  This was scheduled on December 1 at 10 a.m. at the [facility]     building.  She was also to obtain laboratory work on December 13 as per     [employer].  Prescriptions were given to the patient.  2. Infectious disease.  The patient was being treated for Proteus urinary    tract  infection with ciprofloxacin.  She initial had one set of cultures     which grew Escherichia coli in one bottle.  Additional sets showed no     bacteria, and two more sets have been sent as  of November 24 to follow up     on the status for these.  The results, if positive, will be called to Dr.     Mathews Robinsons office,  The patient will remain on ciprofloxacin for one week     following admission.  She was instructed, if she develops any fever,     chills, nausea, vomiting,, to give Dr. Mathews Robinsons office a call.  3. Cardiovascular.  The patient remained stable from a cardiovascular     standpoint in normal sinus rhythm during the course of her     hospitalization.  She will continue with Diltiazem, amiodarone, resume     Plavix and aspirin today, and continue with Cozaar.  4. Genitourinary.  The patient is recovering from a Proteus urinary tract     infection being treated with Cipro.  She will receive 14 days' worth of     this medication.  Her BUN and creatinine on day of discharge were stable     at 8 and 1.0.   DISCHARGE MEDICATIONS:  1. Cipro  500 mg p.o. b.i.d. x 1 week.  2. Diltiazem 120 mg once daily.  3. Amiodarone 200 mg once daily.  4. Plavix three 7.5 mg every other day as per the patient.  5. In hospital, she was on Colace 100 mg once daily.  She will resume her     home stool softener.  6. Aspirin 81 mg once daily.  7. Fosamax 70 mg once weekly.  8. Cozaar 50 mg once daily.  9. Calcium 500 mg twice daily.  10.      Multivitamins.  11.      Vitamin E.  12.      She will be taking Tylenol as directed for pain in the right groin.   DISCHARGE INSTRUCTIONS:  She is to avoid lifting greater than 5 pounds.  She  is not to soak in a bath for greater than one week.  If bleeding has  occurred, she is instructed to apply pressure and go to the emergency room.  She is to remove the Steri-Strip on the right groin site as it peels off on  its own and to call her primary care physician  if she develops any fevers,  chills, nausea, vomiting,, worsening leg pain, or redness at the site of her  percutaneous injection.  She is to follow up with Dr. Renae Gloss in two weeks.   At the time of discharge, the patient was deemed stable with resolving  transaminitis and successful percutaneous embolization of her right hepatic  artery.                                                Melissa L. Ladona Ridgel, MD    MLT/MEDQ  D:  04/06/2003  T:  04/06/2003  Job:  [id number]   cc:   Merlene Laughter. Renae Gloss, M.D.  [address]  [address]  [place]  [place] [address]  Fax: [phone]

## 2010-10-04 ENCOUNTER — Other Ambulatory Visit (HOSPITAL_COMMUNITY): Payer: Self-pay | Admitting: Interventional Radiology

## 2010-10-04 DIAGNOSIS — N2889 Other specified disorders of kidney and ureter: Secondary | ICD-10-CM

## 2010-10-05 ENCOUNTER — Ambulatory Visit (INDEPENDENT_AMBULATORY_CARE_PROVIDER_SITE_OTHER): Payer: Medicare Other

## 2010-10-05 DIAGNOSIS — J309 Allergic rhinitis, unspecified: Secondary | ICD-10-CM

## 2010-10-10 ENCOUNTER — Ambulatory Visit (INDEPENDENT_AMBULATORY_CARE_PROVIDER_SITE_OTHER): Payer: Medicare Other

## 2010-10-10 DIAGNOSIS — J309 Allergic rhinitis, unspecified: Secondary | ICD-10-CM

## 2010-10-17 ENCOUNTER — Ambulatory Visit (INDEPENDENT_AMBULATORY_CARE_PROVIDER_SITE_OTHER): Payer: Medicare Other

## 2010-10-17 DIAGNOSIS — J309 Allergic rhinitis, unspecified: Secondary | ICD-10-CM

## 2010-10-25 ENCOUNTER — Ambulatory Visit (HOSPITAL_COMMUNITY)
Admission: RE | Admit: 2010-10-25 | Discharge: 2010-10-25 | Disposition: A | Discharge status: Home / Self Care | Payer: Medicare Other | Source: Ambulatory Visit | Attending: Interventional Radiology | Admitting: Interventional Radiology

## 2010-10-25 ENCOUNTER — Ambulatory Visit (INDEPENDENT_AMBULATORY_CARE_PROVIDER_SITE_OTHER): Payer: Medicare Other

## 2010-10-25 DIAGNOSIS — K5730 Diverticulosis of large intestine without perforation or abscess without bleeding: Secondary | ICD-10-CM | POA: Insufficient documentation

## 2010-10-25 DIAGNOSIS — J309 Allergic rhinitis, unspecified: Secondary | ICD-10-CM

## 2010-10-25 DIAGNOSIS — N2889 Other specified disorders of kidney and ureter: Secondary | ICD-10-CM

## 2010-10-25 DIAGNOSIS — C649 Malignant neoplasm of unspecified kidney, except renal pelvis: Secondary | ICD-10-CM | POA: Insufficient documentation

## 2010-10-25 DIAGNOSIS — C6990 Malignant neoplasm of unspecified site of unspecified eye: Secondary | ICD-10-CM | POA: Insufficient documentation

## 2010-10-25 MED ORDER — IOHEXOL 300 MG/ML  SOLN
50.0000 mL | Freq: Once | INTRAMUSCULAR | Status: AC | PRN
  Administered 2010-10-25: 50 mL via INTRAVENOUS

## 2010-10-29 ENCOUNTER — Other Ambulatory Visit (HOSPITAL_COMMUNITY): Payer: Medicare Other

## 2010-10-31 ENCOUNTER — Ambulatory Visit
Admission: RE | Admit: 2010-10-31 | Discharge: 2010-10-31 | Disposition: A | Discharge status: Home / Self Care | Payer: Medicare Other | Source: Ambulatory Visit | Attending: Interventional Radiology | Admitting: Interventional Radiology

## 2010-10-31 VITALS — BP 149/74 | HR 52 | Temp 97.6°F | Resp 12

## 2010-10-31 DIAGNOSIS — N2889 Other specified disorders of kidney and ureter: Secondary | ICD-10-CM

## 2010-10-31 NOTE — Progress Notes (Signed)
PT SEEN BY DR Fredia Sorrow - 18YR POST LT RENAL RFA. PT DOING WELL AND WE WILL SEE HER BACK IN 59YR W/ CT .

## 2010-11-16 ENCOUNTER — Ambulatory Visit (INDEPENDENT_AMBULATORY_CARE_PROVIDER_SITE_OTHER): Payer: Medicare Other

## 2010-11-16 DIAGNOSIS — J309 Allergic rhinitis, unspecified: Secondary | ICD-10-CM

## 2010-11-19 ENCOUNTER — Encounter: Payer: Self-pay | Admitting: Internal Medicine

## 2010-11-20 HISTORY — PX: NM MYOVIEW LTD: HXRAD82

## 2010-11-22 ENCOUNTER — Ambulatory Visit (INDEPENDENT_AMBULATORY_CARE_PROVIDER_SITE_OTHER): Payer: Medicare Other

## 2010-11-22 DIAGNOSIS — J309 Allergic rhinitis, unspecified: Secondary | ICD-10-CM

## 2010-11-28 ENCOUNTER — Ambulatory Visit (INDEPENDENT_AMBULATORY_CARE_PROVIDER_SITE_OTHER): Payer: Medicare Other

## 2010-11-28 DIAGNOSIS — J309 Allergic rhinitis, unspecified: Secondary | ICD-10-CM

## 2010-12-05 ENCOUNTER — Ambulatory Visit (INDEPENDENT_AMBULATORY_CARE_PROVIDER_SITE_OTHER): Payer: Medicare Other

## 2010-12-05 DIAGNOSIS — J309 Allergic rhinitis, unspecified: Secondary | ICD-10-CM

## 2010-12-06 ENCOUNTER — Ambulatory Visit (INDEPENDENT_AMBULATORY_CARE_PROVIDER_SITE_OTHER): Payer: Medicare Other

## 2010-12-06 DIAGNOSIS — J309 Allergic rhinitis, unspecified: Secondary | ICD-10-CM

## 2010-12-11 ENCOUNTER — Ambulatory Visit (INDEPENDENT_AMBULATORY_CARE_PROVIDER_SITE_OTHER): Payer: Medicare Other

## 2010-12-11 DIAGNOSIS — J309 Allergic rhinitis, unspecified: Secondary | ICD-10-CM

## 2010-12-19 ENCOUNTER — Ambulatory Visit (INDEPENDENT_AMBULATORY_CARE_PROVIDER_SITE_OTHER): Payer: Medicare Other

## 2010-12-19 DIAGNOSIS — J309 Allergic rhinitis, unspecified: Secondary | ICD-10-CM

## 2010-12-27 ENCOUNTER — Ambulatory Visit (INDEPENDENT_AMBULATORY_CARE_PROVIDER_SITE_OTHER): Payer: Medicare Other

## 2010-12-27 ENCOUNTER — Encounter: Payer: Self-pay | Admitting: Internal Medicine

## 2010-12-27 DIAGNOSIS — J309 Allergic rhinitis, unspecified: Secondary | ICD-10-CM

## 2010-12-28 ENCOUNTER — Encounter: Payer: Self-pay | Admitting: Internal Medicine

## 2010-12-28 ENCOUNTER — Ambulatory Visit (INDEPENDENT_AMBULATORY_CARE_PROVIDER_SITE_OTHER): Payer: Medicare Other | Admitting: Internal Medicine

## 2010-12-28 VITALS — BP 126/74 | HR 54 | Ht 63.0 in | Wt 157.8 lb

## 2010-12-28 DIAGNOSIS — J301 Allergic rhinitis due to pollen: Secondary | ICD-10-CM

## 2010-12-28 DIAGNOSIS — I4891 Unspecified atrial fibrillation: Secondary | ICD-10-CM

## 2010-12-28 MED ORDER — IPRATROPIUM BROMIDE 0.06 % NA SOLN: 1.0000 | Freq: Three times a day (TID) | NASAL | Status: DC | PRN

## 2010-12-28 NOTE — Progress Notes (Signed)
Subjective:    Patient ID: [name], female    DOB: 01/31/1926, 77 y.o.   MRN: [id number]  HPI 12/28/10- 77 year old female former smoker followed for allergic rhinitis eustachian dysfunction complicated by history of atrial fibrillation/amiodarone. Last here June 29, 2010  Disliked the heat this summer but otherwise denies respiratory problems since last here.  Allergy vaccine- ok, getting shots here. Still uses ipratropium nasal spray for rhinorhea when going out, but doesn't bother with it if she is staying home. CXR 06/29/10- clear, NAD, with emphysema/ COPD change, No indication of amiodarone related changes. Got cortisone shot in knee for gout 4 days ago.   Review of Systems Constitutional:   No-   weight loss, night sweats, fevers, chills, fatigue, lassitude. HEENT:   No-  headaches, difficulty swallowing, tooth/dental problems, sore throat,       No-  sneezing, itching, ear ache, nasal congestion, post nasal drip,  CV:  No-   chest pain, orthopnea, PND, swelling in lower extremities, anasarca, dizziness, palpitations Resp: No-   shortness of breath with exertion or at rest.              No-   productive cough,  No non-productive cough,  No-  coughing up of blood.              No-   change in color of mucus.  No- wheezing.   Skin: No-   rash or lesions. GI:  No-   heartburn, indigestion, abdominal pain, nausea, vomiting, diarrhea,                 change in bowel habits, loss of appetite GU: No-   dysuria, change in color of urine, no urgency or frequency.  No- flank pain. MS:  + recent  joint pain or swelling.  No- decreased range of motion.  No- back pain. Neuro- grossly normal to observation, Or:  Psych:  No- change in mood or affect. No depression or anxiety.  No memory loss.      Objective:   Physical Exam General- Alert, Oriented, Affect-appropriate, Distress- none acute Skin- rash-none, lesions- none, excoriation- none Lymphadenopathy- none Head- atraumatic           Eyes- Gross vision intact, PERRLA, conjunctivae clear secretions            Ears- Hearing, canals normal            Nose- Clear, No-Septal dev, mucus, polyps, erosion, perforation             Throat- Mallampati II , mucosa clear , drainage- none, tonsils- atrophic Neck- flexible , trachea midline, no stridor , thyroid nl, carotid no bruit Chest - symmetrical excursion , unlabored           Heart/CV- RRR with occasional extra beat , 1-2/6 systolic murmur AS , no gallop  , no rub, nl s1 s2                           - JVD- none , edema- none, stasis changes- none, varices- none           Lung- clear to P&A, wheeze- none, cough- none , dullness-none, rub- none           Chest wall-  Abd- tender-no, distended-no, bowel sounds-present, HSM- no Br/ Gen/ Rectal- Not done, not indicated Extrem- cyanosis- none, clubbing, none, atrophy- none, strength- nl Neuro- grossly intact to observation  Assessment & Plan:

## 2010-12-28 NOTE — Patient Instructions (Signed)
Refill script for nose spray ipratropium

## 2010-12-28 NOTE — Assessment & Plan Note (Addendum)
She has continued to credits allergy vaccine for better control of her upper and lower respiratory problems.

## 2010-12-28 NOTE — Assessment & Plan Note (Signed)
Well controlled ventricular response rate. CXR w/o signs of amiodarone toxicity in the lung.

## 2011-01-03 ENCOUNTER — Ambulatory Visit (INDEPENDENT_AMBULATORY_CARE_PROVIDER_SITE_OTHER): Payer: Medicare Other

## 2011-01-03 DIAGNOSIS — J309 Allergic rhinitis, unspecified: Secondary | ICD-10-CM

## 2011-01-10 ENCOUNTER — Ambulatory Visit (INDEPENDENT_AMBULATORY_CARE_PROVIDER_SITE_OTHER): Payer: Medicare Other

## 2011-01-10 DIAGNOSIS — J309 Allergic rhinitis, unspecified: Secondary | ICD-10-CM

## 2011-01-16 ENCOUNTER — Ambulatory Visit (INDEPENDENT_AMBULATORY_CARE_PROVIDER_SITE_OTHER): Payer: Medicare Other

## 2011-01-16 DIAGNOSIS — J309 Allergic rhinitis, unspecified: Secondary | ICD-10-CM

## 2011-01-31 ENCOUNTER — Ambulatory Visit (INDEPENDENT_AMBULATORY_CARE_PROVIDER_SITE_OTHER): Payer: Medicare Other

## 2011-01-31 DIAGNOSIS — J309 Allergic rhinitis, unspecified: Secondary | ICD-10-CM

## 2011-02-01 ENCOUNTER — Other Ambulatory Visit: Payer: Self-pay | Admitting: Orthopaedic Surgery

## 2011-02-01 DIAGNOSIS — M25561 Pain in right knee: Secondary | ICD-10-CM

## 2011-02-02 ENCOUNTER — Other Ambulatory Visit: Payer: Self-pay

## 2011-02-02 ENCOUNTER — Ambulatory Visit
Admission: RE | Admit: 2011-02-02 | Discharge: 2011-02-02 | Disposition: A | Discharge status: Home / Self Care | Payer: Medicare Other | Source: Ambulatory Visit | Attending: Orthopaedic Surgery | Admitting: Orthopaedic Surgery

## 2011-02-02 DIAGNOSIS — M25561 Pain in right knee: Secondary | ICD-10-CM

## 2011-02-06 ENCOUNTER — Ambulatory Visit (INDEPENDENT_AMBULATORY_CARE_PROVIDER_SITE_OTHER): Payer: Medicare Other

## 2011-02-06 DIAGNOSIS — J309 Allergic rhinitis, unspecified: Secondary | ICD-10-CM

## 2011-02-11 HISTORY — PX: KNEE ARTHROSCOPY: SHX127

## 2011-02-13 ENCOUNTER — Ambulatory Visit (INDEPENDENT_AMBULATORY_CARE_PROVIDER_SITE_OTHER): Payer: Medicare Other

## 2011-02-13 DIAGNOSIS — J309 Allergic rhinitis, unspecified: Secondary | ICD-10-CM

## 2011-02-13 LAB — PROTIME-INR
INR: 0.9
Prothrombin Time: 12.7

## 2011-02-13 LAB — URINALYSIS, ROUTINE W REFLEX MICROSCOPIC
Glucose, UA: NEGATIVE
Glucose, UA: NEGATIVE
Hgb urine dipstick: NEGATIVE
Ketones, ur: 15 — AB
Ketones, ur: NEGATIVE
Nitrite: NEGATIVE
Protein, ur: NEGATIVE
Specific Gravity, Urine: 1.034 — ABNORMAL HIGH
pH: 5.5
pH: 6

## 2011-02-13 LAB — COMPREHENSIVE METABOLIC PANEL
ALT: 378 — ABNORMAL HIGH
AST: 225 — ABNORMAL HIGH
AST: 986 — ABNORMAL HIGH
Albumin: 2.9 — ABNORMAL LOW
Albumin: 2.9 — ABNORMAL LOW
Albumin: 3.4 — ABNORMAL LOW
Alkaline Phosphatase: 242 — ABNORMAL HIGH
BUN: 7
Calcium: 8.1 — ABNORMAL LOW
Calcium: 8.4
Calcium: 8.5
Chloride: 110
Chloride: 111
Creatinine, Ser: 0.93
Creatinine, Ser: 1.11
GFR calc Af Amer: 57 — ABNORMAL LOW
GFR calc Af Amer: >60
Glucose, Bld: 149 — ABNORMAL HIGH
Potassium: 4.5
Sodium: 131 — ABNORMAL LOW
Total Bilirubin: 2.4 — ABNORMAL HIGH
Total Protein: 5.3 — ABNORMAL LOW
Total Protein: 5.7 — ABNORMAL LOW
Total Protein: 5.7 — ABNORMAL LOW

## 2011-02-13 LAB — CBC
Hemoglobin: 14.8
Hemoglobin: 14.9
MCHC: 33.3
MCHC: 33.4
MCHC: 33.5
MCV: 98
MCV: 98.5
MCV: 99.2
Platelets: 127 — ABNORMAL LOW
Platelets: 135 — ABNORMAL LOW
Platelets: 139 — ABNORMAL LOW
RBC: 4.51
RDW: 13.2
RDW: 13.9
WBC: 15.1 — ABNORMAL HIGH
WBC: 8.5

## 2011-02-13 LAB — POCT I-STAT, CHEM 8
BUN: 17
Creatinine, Ser: 1.3 — ABNORMAL HIGH
Sodium: 134 — ABNORMAL LOW
TCO2: 18

## 2011-02-13 LAB — URINE CULTURE: Colony Count: NO GROWTH

## 2011-02-13 LAB — LIPID PANEL
HDL: 51
Triglycerides: 43

## 2011-02-13 LAB — TSH: TSH: 1.634

## 2011-02-13 LAB — BASIC METABOLIC PANEL
CO2: 21
Chloride: 111
GFR calc Af Amer: >60
Potassium: 4
Sodium: 140

## 2011-02-13 LAB — DIFFERENTIAL
Eosinophils Absolute: 0
Lymphs Abs: 0.4 — ABNORMAL LOW
Monocytes Absolute: 0 — ABNORMAL LOW
Monocytes Relative: 0 — ABNORMAL LOW
Neutro Abs: 6.6
Neutrophils Relative %: 93 — ABNORMAL HIGH

## 2011-02-13 LAB — URINE MICROSCOPIC-ADD ON

## 2011-02-15 LAB — COMPREHENSIVE METABOLIC PANEL
ALT: 23 U/L (ref 0–35)
BUN: 16 mg/dL (ref 6–23)
CO2: 23 mEq/L (ref 19–32)
Calcium: 8.8 mg/dL (ref 8.4–10.5)
Creatinine, Ser: 1.36 mg/dL — ABNORMAL HIGH (ref 0.4–1.2)
GFR calc non Af Amer: 37 mL/min — ABNORMAL LOW (ref >60)
Glucose, Bld: 103 mg/dL — ABNORMAL HIGH (ref 70–99)
Total Protein: 5.7 g/dL — ABNORMAL LOW (ref 6.0–8.3)

## 2011-02-15 LAB — CBC
HCT: 45 % (ref 36.0–46.0)
Hemoglobin: 15 g/dL (ref 12.0–15.0)
MCHC: 33.4 g/dL (ref 30.0–36.0)
MCV: 98.3 fL (ref 78.0–100.0)
RBC: 4.57 MIL/uL (ref 3.87–5.11)
RDW: 13.8 % (ref 11.5–15.5)

## 2011-02-15 LAB — URINALYSIS, ROUTINE W REFLEX MICROSCOPIC
Glucose, UA: NEGATIVE mg/dL
Hgb urine dipstick: NEGATIVE
Ketones, ur: 15 mg/dL — AB
Protein, ur: 30 mg/dL — AB
Urobilinogen, UA: 1 mg/dL (ref 0.0–1.0)

## 2011-02-15 LAB — URINE CULTURE

## 2011-02-15 LAB — POCT CARDIAC MARKERS
CKMB, poc: <1 ng/mL — ABNORMAL LOW (ref 1.0–8.0)
Myoglobin, poc: 65.3 ng/mL (ref 12–200)

## 2011-02-15 LAB — DIFFERENTIAL
Eosinophils Absolute: 0.1 10*3/uL (ref 0.0–0.7)
Lymphs Abs: 1.7 10*3/uL (ref 0.7–4.0)
Monocytes Relative: 10 % (ref 3–12)
Neutro Abs: 4.8 10*3/uL (ref 1.7–7.7)
Neutrophils Relative %: 65 % (ref 43–77)

## 2011-02-15 LAB — URINE MICROSCOPIC-ADD ON

## 2011-02-20 ENCOUNTER — Ambulatory Visit (INDEPENDENT_AMBULATORY_CARE_PROVIDER_SITE_OTHER): Payer: Medicare Other

## 2011-02-20 DIAGNOSIS — J309 Allergic rhinitis, unspecified: Secondary | ICD-10-CM

## 2011-03-04 ENCOUNTER — Encounter: Payer: Self-pay | Admitting: Internal Medicine

## 2011-03-04 ENCOUNTER — Ambulatory Visit (INDEPENDENT_AMBULATORY_CARE_PROVIDER_SITE_OTHER): Payer: Medicare Other

## 2011-03-04 DIAGNOSIS — J309 Allergic rhinitis, unspecified: Secondary | ICD-10-CM

## 2011-03-13 ENCOUNTER — Ambulatory Visit (INDEPENDENT_AMBULATORY_CARE_PROVIDER_SITE_OTHER): Payer: Medicare Other

## 2011-03-13 DIAGNOSIS — J309 Allergic rhinitis, unspecified: Secondary | ICD-10-CM

## 2011-03-19 ENCOUNTER — Encounter (HOSPITAL_COMMUNITY): Payer: Self-pay | Admitting: *Deleted

## 2011-03-19 ENCOUNTER — Other Ambulatory Visit: Payer: Self-pay

## 2011-03-19 ENCOUNTER — Emergency Department (HOSPITAL_COMMUNITY): Payer: Medicare Other

## 2011-03-19 ENCOUNTER — Inpatient Hospital Stay (HOSPITAL_COMMUNITY)
Admission: EM | Admit: 2011-03-19 | Discharge: 2011-03-20 | DRG: 310 | Disposition: A | Discharge status: Home / Self Care | Payer: Medicare Other | Attending: Cardiovascular Disease | Admitting: Cardiovascular Disease

## 2011-03-19 DIAGNOSIS — I422 Other hypertrophic cardiomyopathy: Secondary | ICD-10-CM | POA: Diagnosis present

## 2011-03-19 DIAGNOSIS — Z7901 Long term (current) use of anticoagulants: Secondary | ICD-10-CM

## 2011-03-19 DIAGNOSIS — I4891 Unspecified atrial fibrillation: Principal | ICD-10-CM | POA: Diagnosis present

## 2011-03-19 DIAGNOSIS — Z87891 Personal history of nicotine dependence: Secondary | ICD-10-CM

## 2011-03-19 DIAGNOSIS — Z860100 Personal history of colon polyps, unspecified: Secondary | ICD-10-CM

## 2011-03-19 DIAGNOSIS — J4489 Other specified chronic obstructive pulmonary disease: Secondary | ICD-10-CM | POA: Diagnosis present

## 2011-03-19 DIAGNOSIS — K5730 Diverticulosis of large intestine without perforation or abscess without bleeding: Secondary | ICD-10-CM | POA: Diagnosis present

## 2011-03-19 DIAGNOSIS — J449 Chronic obstructive pulmonary disease, unspecified: Secondary | ICD-10-CM | POA: Diagnosis present

## 2011-03-19 DIAGNOSIS — E785 Hyperlipidemia, unspecified: Secondary | ICD-10-CM | POA: Diagnosis present

## 2011-03-19 DIAGNOSIS — I10 Essential (primary) hypertension: Secondary | ICD-10-CM | POA: Diagnosis present

## 2011-03-19 DIAGNOSIS — I4819 Other persistent atrial fibrillation: Secondary | ICD-10-CM | POA: Diagnosis present

## 2011-03-19 DIAGNOSIS — I452 Bifascicular block: Secondary | ICD-10-CM | POA: Diagnosis present

## 2011-03-19 LAB — CBC
MCH: 33.1 pg (ref 26.0–34.0)
MCHC: 34.9 g/dL (ref 30.0–36.0)
MCV: 94.8 fL (ref 78.0–100.0)
Platelets: 158 10*3/uL (ref 150–400)
RBC: 4.44 MIL/uL (ref 3.87–5.11)

## 2011-03-19 LAB — T4, FREE: Free T4: 1.52 ng/dL (ref 0.80–1.80)

## 2011-03-19 LAB — PROTIME-INR
INR: 1.1 (ref 0.00–1.49)
Prothrombin Time: 14.4 seconds (ref 11.6–15.2)

## 2011-03-19 LAB — COMPREHENSIVE METABOLIC PANEL
ALT: 21 U/L (ref 0–35)
AST: 23 U/L (ref 0–37)
CO2: 23 mEq/L (ref 19–32)
Chloride: 108 mEq/L (ref 96–112)
Creatinine, Ser: 0.91 mg/dL (ref 0.50–1.10)
GFR calc non Af Amer: 57 mL/min — ABNORMAL LOW (ref >90)
Sodium: 141 mEq/L (ref 135–145)
Total Bilirubin: 0.7 mg/dL (ref 0.3–1.2)

## 2011-03-19 LAB — APTT: aPTT: 29 seconds (ref 24–37)

## 2011-03-19 MED ORDER — VITAMIN E 180 MG (400 UNIT) PO CAPS
800.0000 [IU] | ORAL_CAPSULE | Freq: Every day | ORAL | Status: DC
  Administered 2011-03-20: 800 [IU] via ORAL
  Filled 2011-03-19: qty 2

## 2011-03-19 MED ORDER — MULTIVITAMINS PO CAPS: 1.0000 | ORAL_CAPSULE | Freq: Every day | ORAL | Status: DC

## 2011-03-19 MED ORDER — DILTIAZEM HCL ER COATED BEADS 120 MG PO CP24
120.0000 mg | ORAL_CAPSULE | Freq: Every day | ORAL | Status: DC
  Administered 2011-03-20: 120 mg via ORAL
  Filled 2011-03-19: qty 1

## 2011-03-19 MED ORDER — NITROGLYCERIN 0.4 MG SL SUBL: 0.4000 mg | SUBLINGUAL_TABLET | SUBLINGUAL | Status: DC | PRN

## 2011-03-19 MED ORDER — HEPARIN SODIUM (PORCINE) 5000 UNIT/ML IJ SOLN
INTRAMUSCULAR | Status: AC
  Filled 2011-03-19: qty 1

## 2011-03-19 MED ORDER — LORATADINE 10 MG PO TABS
10.0000 mg | ORAL_TABLET | Freq: Every day | ORAL | Status: DC | PRN
  Filled 2011-03-19: qty 1

## 2011-03-19 MED ORDER — ACETAMINOPHEN 325 MG PO TABS: 650.0000 mg | ORAL_TABLET | ORAL | Status: DC | PRN

## 2011-03-19 MED ORDER — IPRATROPIUM BROMIDE 0.06 % NA SOLN: 2.0000 | Freq: Every day | NASAL | Status: DC | PRN

## 2011-03-19 MED ORDER — LOSARTAN POTASSIUM 50 MG PO TABS
100.0000 mg | ORAL_TABLET | Freq: Every day | ORAL | Status: DC
  Administered 2011-03-20: 100 mg via ORAL
  Filled 2011-03-19: qty 2

## 2011-03-19 MED ORDER — POTASSIUM CHLORIDE 20 MEQ/15ML (10%) PO LIQD
ORAL | Status: AC
  Filled 2011-03-19: qty 30

## 2011-03-19 MED ORDER — ROSUVASTATIN CALCIUM 5 MG PO TABS
5.0000 mg | ORAL_TABLET | Freq: Every day | ORAL | Status: DC
  Filled 2011-03-19: qty 1

## 2011-03-19 MED ORDER — ONDANSETRON HCL 4 MG/2ML IJ SOLN: 4.0000 mg | Freq: Four times a day (QID) | INTRAMUSCULAR | Status: DC | PRN

## 2011-03-19 MED ORDER — SODIUM CHLORIDE 0.9 % IJ SOLN: 3.0000 mL | INTRAMUSCULAR | Status: DC | PRN

## 2011-03-19 MED ORDER — CLOPIDOGREL BISULFATE 75 MG PO TABS
75.0000 mg | ORAL_TABLET | Freq: Every day | ORAL | Status: DC
Start: 2011-03-20 — End: 2011-03-20

## 2011-03-19 MED ORDER — POTASSIUM CHLORIDE 20 MEQ/15ML (10%) PO LIQD
40.0000 meq | Freq: Once | ORAL | Status: AC
  Administered 2011-03-19: 40 meq via ORAL

## 2011-03-19 MED ORDER — HEPARIN (PORCINE) IN NACL 100-0.45 UNIT/ML-% IJ SOLN
12.0000 [IU]/kg/h | INTRAMUSCULAR | Status: DC
  Administered 2011-03-19: 12 [IU]/kg/h via INTRAVENOUS
  Filled 2011-03-19 (×2): qty 250

## 2011-03-19 MED ORDER — METOPROLOL TARTRATE 12.5 MG HALF TABLET
12.5000 mg | ORAL_TABLET | Freq: Two times a day (BID) | ORAL | Status: DC
  Administered 2011-03-20 (×2): 12.5 mg via ORAL
  Filled 2011-03-19 (×3): qty 1

## 2011-03-19 MED ORDER — CALCIUM CARBONATE 1250 (500 CA) MG PO TABS
1.0000 | ORAL_TABLET | Freq: Two times a day (BID) | ORAL | Status: DC
  Administered 2011-03-20 (×2): 500 mg via ORAL
  Filled 2011-03-19 (×4): qty 1

## 2011-03-19 MED ORDER — THERA M PLUS PO TABS
1.0000 | ORAL_TABLET | ORAL | Status: DC
  Administered 2011-03-20: 1 via ORAL
  Filled 2011-03-19 (×2): qty 1

## 2011-03-19 MED ORDER — HYDROCODONE-HOMATROPINE 5-1.5 MG/5ML PO SYRP: 5.0000 mL | ORAL_SOLUTION | Freq: Every evening | ORAL | Status: DC | PRN

## 2011-03-19 MED ORDER — ASPIRIN EC 81 MG PO TBEC
81.0000 mg | DELAYED_RELEASE_TABLET | Freq: Every day | ORAL | Status: DC
  Administered 2011-03-20: 81 mg via ORAL
  Filled 2011-03-19: qty 1

## 2011-03-19 MED ORDER — HEPARIN BOLUS VIA INFUSION
2500.0000 [IU] | Freq: Once | INTRAVENOUS | Status: AC
  Administered 2011-03-19: 2500 [IU] via INTRAVENOUS
  Filled 2011-03-19 (×2): qty 2500

## 2011-03-19 MED ORDER — AMIODARONE HCL 200 MG PO TABS
200.0000 mg | ORAL_TABLET | Freq: Every day | ORAL | Status: DC
  Administered 2011-03-20: 200 mg via ORAL
  Filled 2011-03-19: qty 1

## 2011-03-19 MED ORDER — METOPROLOL TARTRATE 1 MG/ML IV SOLN
5.0000 mg | Freq: Once | INTRAVENOUS | Status: AC
  Administered 2011-03-19: 5 mg via INTRAVENOUS
  Filled 2011-03-19: qty 5

## 2011-03-19 MED ORDER — CLOPIDOGREL BISULFATE 75 MG PO TABS
75.0000 mg | ORAL_TABLET | Freq: Every day | ORAL | Status: DC
  Administered 2011-03-20: 75 mg via ORAL
  Filled 2011-03-19 (×2): qty 1

## 2011-03-19 MED ORDER — HEPARIN (PORCINE) IN NACL 100-0.45 UNIT/ML-% IJ SOLN
12.0000 [IU]/kg/h | INTRAMUSCULAR | Status: DC
Start: 2011-03-19 — End: 2011-03-19

## 2011-03-19 MED ORDER — METOPROLOL TARTRATE 1 MG/ML IV SOLN
2.5000 mg | Freq: Once | INTRAVENOUS | Status: AC
  Administered 2011-03-19: 2.5 mg via INTRAVENOUS
  Filled 2011-03-19: qty 5

## 2011-03-19 MED ORDER — ASPIRIN 81 MG PO TABS: 81.0000 mg | ORAL_TABLET | Freq: Every day | ORAL | Status: DC

## 2011-03-19 MED ORDER — PANTOPRAZOLE SODIUM 40 MG PO TBEC
40.0000 mg | DELAYED_RELEASE_TABLET | Freq: Every day | ORAL | Status: DC
  Administered 2011-03-20: 40 mg via ORAL
  Filled 2011-03-19: qty 1

## 2011-03-19 MED ORDER — CALCIUM CARBONATE ANTACID 500 MG PO CHEW: 2.0000 | CHEWABLE_TABLET | Freq: Every day | ORAL | Status: DC

## 2011-03-19 NOTE — ED Notes (Signed)
Pt given happy meal. Remains on monitor.
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
## 2011-03-19 NOTE — ED Notes (Signed)
Pt is taking home dose of 120mg  Diltiazem and 200mg  of Amiodarone per Dr Patria Mane.

## 2011-03-19 NOTE — ED Notes (Signed)
EDP at bedside speaking with pt and family, remains on monitor

## 2011-03-19 NOTE — Progress Notes (Signed)
ANTICOAGULATION CONSULT NOTE - Initial Consult  Pharmacy Consult for UFH Indication: atrial fibrillation  No Known Allergies  Patient Measurements: Height: 5\' 3"  (160 cm) Weight: 150 lb (68.04 kg) IBW/kg (Calculated) : 52.4  Adjusted Body Weight: 53  Vital Signs: Temp: 97.9 F (36.6 C) (11/06 1029) Temp src: Oral (11/06 1029) BP: 108/61 mmHg (11/06 1646) Pulse Rate: 80  (11/06 1646)  Labs:  Basename 03/19/11 1108  HGB 14.7  HCT 42.1  PLT 158  APTT --  LABPROT --  INR --  HEPARINUNFRC --  CREATININE 0.91  CKTOTAL --  CKMB --  TROPONINI <0.30   Estimated Creatinine Clearance: 43.3 ml/min (by C-G formula based on Cr of 0.91).  Medical History: Past Medical History  Diagnosis Date  . Personal history of colonic polyps   . Diverticulosis of colon (without mention of hemorrhage)   . COPD (chronic obstructive pulmonary disease)   . Dysfunction of eustachian tube   . Atrial fibrillation   . Allergic rhinitis   . Hypertrophic cardiomyopathy   . Fibromuscular dysplasia   . Aneurysm   . HTN (hypertension)   . Hyperlipidemia     Medications:   (Not in a hospital admission)  Assessment: 77 y/o female patient admitted with sob, found to be in afib requiring anticoagulation. Goal of Therapy:  Heparin level 0.3-0.7 units/ml   Plan:  Begin heparin 2500 unit iv bolus followed by infusion at 700 units/hr. Check 8 hour heparin level. Daily cbc and heparin level.  [name], [name] M 03/19/2011,4:50 PM

## 2011-03-19 NOTE — ED Notes (Signed)
Pt. Transported to the floor via stretcher with cardiac monitor and RN, pt. Alert and oriented, NAD noted

## 2011-03-19 NOTE — ED Notes (Signed)
Pt moved to yellow for pending bed assignment. Pt is aware of poc. Pt denies chest pain. Denies any palpitations or sob. Vitals remain WNL. Pt placed on monitor in 19.

## 2011-03-19 NOTE — ED Notes (Signed)
Pt states she woke up last night and could tell her heart was out of rhythm. Pt has hx of afib. Pt took heart rate and it was in the 110s. Pt reports low blood pressure associated. Pt has machine at home. At this time pt states she is feeling a lot better and can tell it is more controlled. HR at this time 80-90 afib. Vitals stable. Denies chest pain.

## 2011-03-19 NOTE — ED Notes (Signed)
Order for ASA NOW canceled, pt given 324asa en route
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
## 2011-03-19 NOTE — ED Notes (Signed)
Pt noted to still by in afib

## 2011-03-19 NOTE — ED Notes (Signed)
Received pt. In room 19 via stretcher, NAD noted, pt. Alert and oriented denies pain or nausea

## 2011-03-19 NOTE — ED Notes (Signed)
EMS-pt from home. Pt reports midsternal chest pain radiating to left arm that started last night. One episode of diaphoresis. Pt noted to be in a fib, hx of same. 22g(L)AC. 324 asa en route. No nitro given, pt chest pain free at this time.

## 2011-03-19 NOTE — ED Notes (Signed)
Admitting at bedside 

## 2011-03-19 NOTE — ED Notes (Signed)
Pt helped to restroom. Tolerated well. Placed back on mnitor.

## 2011-03-20 LAB — CBC
HCT: 39.7 % (ref 36.0–46.0)
Hemoglobin: 13.6 g/dL (ref 12.0–15.0)
MCH: 32.5 pg (ref 26.0–34.0)
MCHC: 34.5 g/dL (ref 30.0–36.0)
MCHC: 34.3 g/dL (ref 30.0–36.0)
MCV: 94.7 fL (ref 78.0–100.0)
Platelets: 160 10*3/uL (ref 150–400)
RDW: 13.7 % (ref 11.5–15.5)
WBC: 7.7 10*3/uL (ref 4.0–10.5)

## 2011-03-20 LAB — HEPARIN LEVEL (UNFRACTIONATED): Heparin Unfractionated: 0.23 IU/mL — ABNORMAL LOW (ref 0.30–0.70)

## 2011-03-20 MED ORDER — HEPARIN (PORCINE) IN NACL 100-0.45 UNIT/ML-% IJ SOLN
900.0000 [IU]/h | INTRAMUSCULAR | Status: DC
  Administered 2011-03-20: 900 [IU]/h via INTRAVENOUS
  Administered 2011-03-20: 92 [IU]/kg/h via INTRAVENOUS
  Filled 2011-03-20: qty 250

## 2011-03-20 MED ORDER — METOPROLOL TARTRATE 12.5 MG HALF TABLET: 12.5000 mg | ORAL_TABLET | Freq: Two times a day (BID) | ORAL | Status: DC

## 2011-03-20 MED ORDER — HEPARIN BOLUS VIA INFUSION
1500.0000 [IU] | Freq: Once | INTRAVENOUS | Status: AC
  Administered 2011-03-20: 1500 [IU] via INTRAVENOUS
  Filled 2011-03-20: qty 1500

## 2011-03-20 MED ORDER — DABIGATRAN ETEXILATE MESYLATE 150 MG PO CAPS: 150.0000 mg | ORAL_CAPSULE | Freq: Two times a day (BID) | ORAL | Status: DC

## 2011-03-20 MED ORDER — HEPARIN BOLUS VIA INFUSION
1000.0000 [IU] | Freq: Once | INTRAVENOUS | Status: AC
  Administered 2011-03-20: 1000 [IU] via INTRAVENOUS
  Filled 2011-03-20: qty 1000

## 2011-03-20 MED ORDER — HEPARIN (PORCINE) IN NACL 100-0.45 UNIT/ML-% IJ SOLN
1050.0000 [IU]/h | INTRAMUSCULAR | Status: DC
Start: 2011-03-20 — End: 2011-03-20
  Administered 2011-03-20: 1050 [IU]/h via INTRAVENOUS
  Filled 2011-03-20: qty 250

## 2011-03-20 MED ORDER — NITROGLYCERIN 0.4 MG SL SUBL: 0.4000 mg | SUBLINGUAL_TABLET | SUBLINGUAL | Status: DC | PRN

## 2011-03-20 MED ORDER — DABIGATRAN ETEXILATE MESYLATE 150 MG PO CAPS
150.0000 mg | ORAL_CAPSULE | Freq: Two times a day (BID) | ORAL | Status: DC
  Administered 2011-03-20: 150 mg via ORAL
  Filled 2011-03-20: qty 1

## 2011-03-20 NOTE — Progress Notes (Signed)
Patient discharged to home in stable condition.  D/C instructions reviewed with patient including medications, activity, and follow up appointments.  All questions answered.  Patient left with husband via wheelchair. [name]

## 2011-03-20 NOTE — Plan of Care (Signed)
Problem: Consults Goal: Atrial Arhythmia Patient Education (See Patient Education module for education specifics.)  Outcome: Completed/Met Date Met:  03/20/11 RN spoke to pt about atrial fib and cardioversion

## 2011-03-20 NOTE — Progress Notes (Addendum)
[facility] DAILY PROGRESS NOTE  [name]   [id number] Mar 07, 1926     Subjective:  No complaints  Objective:  Temp:  [97.5 F (36.4 C)-98.5 F (36.9 C)] 98.2 F (36.8 C) (11/07 0557) Pulse Rate:  [69-104] 77  (11/06 1900) Resp:  [17-28] 18  (11/07 0557) BP: (95-138)/(51-91) 135/78 mmHg (11/07 0557) SpO2:  [92 %-98 %] 97 % (11/07 0557) Weight:  [68.04 kg (150 lb)-70.6 kg (155 lb 10.3 oz)] 155 lb 10.3 oz (70.6 kg) (11/06 1900) Weight change:   Intake/Output from previous day: 11/06 0701 - 11/07 0700 In: 232 [P.O.:180; IV Piggyback:52] Out: 650 [Urine:650] Intake/Output from this shift:    Physical Exam: General appearance: alert, cooperative and no distress Lungs: clear to auscultation bilaterally Heart: irregularly irregular rhythm and 1/6 systolic mm Extremities: no LEE Pulses: 2+ and symmetric  Lab Results: Results for orders placed during the hospital encounter of 03/19/11 (from the past 48 hour(s))  CBC     Status: Normal   Collection Time   03/19/11 11:08 AM      Component Value Range Comment   WBC 9.4  4.0 - 10.5 (K/uL)    RBC 4.44  3.87 - 5.11 (MIL/uL)    Hemoglobin 14.7  12.0 - 15.0 (g/dL)    HCT 56.2  13.0 - 86.5 (%)    MCV 94.8  78.0 - 100.0 (fL)    MCH 33.1  26.0 - 34.0 (pg)    MCHC 34.9  30.0 - 36.0 (g/dL)    RDW 78.4  69.6 - 29.5 (%)    Platelets 158  150 - 400 (K/uL)   TROPONIN I     Status: Normal   Collection Time   03/19/11 11:08 AM      Component Value Range Comment   Troponin I <0.30  <0.30 (ng/mL)   MAGNESIUM     Status: Normal   Collection Time   03/19/11 11:08 AM      Component Value Range Comment   Magnesium 2.0  1.5 - 2.5 (mg/dL)   TSH     Status: Normal   Collection Time   03/19/11 11:08 AM      Component Value Range Comment   TSH 1.867  0.350 - 4.500 (uIU/mL)   T4, FREE     Status: Normal   Collection Time   03/19/11 11:08 AM      Component Value Range Comment   Free T4 1.52  0.80 - 1.80  (ng/dL)   COMPREHENSIVE METABOLIC PANEL     Status: Abnormal   Collection Time   03/19/11 11:08 AM      Component Value Range Comment   Sodium 141  135 - 145 (mEq/L)    Potassium 3.4 (*) 3.5 - 5.1 (mEq/L)    Chloride 108  96 - 112 (mEq/L)    CO2 23  19 - 32 (mEq/L)    Glucose, Bld 98  70 - 99 (mg/dL)    BUN 15  6 - 23 (mg/dL)    Creatinine, Ser 2.84  0.50 - 1.10 (mg/dL)    Calcium 9.3  8.4 - 10.5 (mg/dL)    Total Protein 6.4  6.0 - 8.3 (g/dL)    Albumin 3.2 (*) 3.5 - 5.2 (g/dL)    AST 23  0 - 37 (U/L)    ALT 21  0 - 35 (U/L)    Alkaline Phosphatase 91  39 - 117 (U/L)    Total Bilirubin 0.7  0.3 -  1.2 (mg/dL)    GFR calc non Af Amer 57 (*) >90 (mL/min)    GFR calc Af Amer 66 (*) >90 (mL/min)   PROTIME-INR     Status: Normal   Collection Time   03/19/11  4:41 PM      Component Value Range Comment   Prothrombin Time 14.4  11.6 - 15.2 (seconds)    INR 1.10  0.00 - 1.49    APTT     Status: Normal   Collection Time   03/19/11  4:41 PM      Component Value Range Comment   aPTT 29  24 - 37 (seconds)   HEPARIN LEVEL     Status: Abnormal   Collection Time   03/20/11 12:46 AM      Component Value Range Comment   Heparin Unfractionated 0.23 (*) 0.30 - 0.70 (IU/mL)   CBC     Status: Normal   Collection Time   03/20/11 12:46 AM      Component Value Range Comment   WBC 7.7  4.0 - 10.5 (K/uL)    RBC 4.32  3.87 - 5.11 (MIL/uL)    Hemoglobin 14.2  12.0 - 15.0 (g/dL)    HCT 16.1  09.6 - 04.5 (%)    MCV 95.1  78.0 - 100.0 (fL)    MCH 32.9  26.0 - 34.0 (pg)    MCHC 34.5  30.0 - 36.0 (g/dL)    RDW 40.9  81.1 - 91.4 (%)    Platelets 160  150 - 400 (K/uL)      Imaging:   Echo:  NST:    Treatment Team:  Runell Gess, MD  Assessment/Plan:   Principal Problem:  *Atrial fibrillation Active Problems:  Right bundle branch block and left anterior fascicular block   PLAN:  No DCCV today.  Possibly on Friday-will make NPO.  IV heparin.  Rate controlled.  AM BMET. Heart Healthy  Diet.   Time Spent Directly with Patient:  10 minutes  Length of Stay:  LOS: 1 day    HAGER,BRYAN W PA-C 03/20/2011, 8:05 AM   Pt. Seen and examined. Agree with the NP/PA-C note as written. Agree .Marland Kitchen HR control improved. Remains in a-fib.  I am not able to arrange TEE/Cardioversion at any point in the next 2 days. Currently she is doing well and rate-controlled.  Will recommend starting Pradaxa. D/C home and f/u with Dr. Allyson Sabal, he can decide if he wishes to do an outpatient cardioversion, provided she does not spontaneously convert to sinus rhythm.

## 2011-03-20 NOTE — Progress Notes (Signed)
Utilization review completed. Varick Keys, RN, BSN.  03/20/11  

## 2011-03-20 NOTE — Discharge Summary (Signed)
Physician Discharge Summary  Patient ID: ANISTEN TOMASSI MRN: [id number] DOB/AGE: 1926-04-06 77 y.o.  Admit date: 03/19/2011 Discharge date: 03/20/2011  Admission Diagnoses:  Discharge Diagnoses:  Principal Problem:  *Atrial fibrillation Active Problems:  Right bundle branch block and left anterior fascicular block Hx HCM Hx dyslipidemia    Discharged Condition: stable  Hospital Course: Pt adm with recurrent AF.  We were unable to arrange for TEE CV secondary to scheduling. Dr Rennis Golden feels the pt can be d/c on Pradaxa and f/u with Dr Allyson Sabal as an OP to schedule cardioversion. We did add low dose beta blocker with good rate control. She is already on Diltiazem. In the past she has had problems with bradycardia when in NSR so we'll need to be cautious about titraing her meds.   Consults: none  Significant Diagnostic Studies:  Treatments: cardiac meds: metoprolol and anticoagulation:Pradaxa  Discharge Exam: Blood pressure 121/76, pulse 86, temperature 97.3 F (36.3 C), temperature source Oral, resp. rate 19, height 5\' 3"  (1.6 m), weight 70.6 kg (155 lb 10.3 oz), SpO2 93.00%.   Disposition:   Discharge Orders    Future Appointments: Provider: Department: Dept Phone: Center:   07/02/2011 9:45 AM Waymon Budge, MD [facility] [phone] None     Current Discharge Medication List    START taking these medications   Details  dabigatran (PRADAXA) 150 MG CAPS Take 1 capsule (150 mg total) by mouth every 12 (twelve) hours. Qty: 60 capsule, Refills: 5    metoprolol tartrate (LOPRESSOR) 12.5 mg TABS Take 0.5 tablets (12.5 mg total) by mouth 2 (two) times daily. Qty: 30 tablet, Refills: 5    nitroGLYCERIN (NITROSTAT) 0.4 MG SL tablet Place 1 tablet (0.4 mg total) under the tongue every 5 (five) minutes as needed for chest pain. Qty: 25 tablet, Refills: 2      CONTINUE these medications which have NOT CHANGED   Details  amiodarone (PACERONE) 200 MG tablet Take 200 mg by  mouth daily.     aspirin 81 MG tablet Take 81 mg by mouth daily.      calcium carbonate (OS-CAL - DOSED IN MG OF ELEMENTAL CALCIUM) 1250 MG tablet Take 1 tablet by mouth 2 (two) times daily.      diltiazem (CARDIZEM CD) 120 MG 24 hr capsule Take 120 mg by mouth daily.      HYDROcodone-homatropine (HYCODAN) 5-1.5 MG/5ML syrup Take 5 mLs by mouth at bedtime as needed. 1 tsp every 6 hours as needed for cough.     loratadine (CLARITIN) 10 MG tablet Take 10 mg by mouth daily as needed.      losartan (COZAAR) 100 MG tablet Take 100 mg by mouth daily.      Multiple Vitamins-Minerals (MULTIVITAMINS THER. W/MINERALS) TABS Take 1 tablet by mouth every morning.      Omega-3 Fatty Acids (FISH OIL) 1000 MG CAPS Take 2 capsules by mouth 2 (two) times daily.     omeprazole (PRILOSEC) 40 MG capsule Take 40 mg by mouth daily.      rosuvastatin (CRESTOR) 5 MG tablet Take 5 mg by mouth at bedtime.     vitamin E 400 UNIT capsule Take 800 Units by mouth daily.      ipratropium (ATROVENT) 0.06 % nasal spray Place 2 sprays into the nose daily as needed.        STOP taking these medications     calcium carbonate (TUMS - DOSED IN MG ELEMENTAL CALCIUM) 500 MG chewable tablet  clopidogrel (PLAVIX) 75 MG tablet      diltiazem (CARDIZEM) 120 MG tablet      Multiple Vitamin (MULTIVITAMIN) capsule      pantoprazole (PROTONIX) 40 MG tablet        Follow-up Information    Follow up with Runell Gess, MD. (office will call)    Contact information:   [address]  [place] [address] [phone]          Signed: Abelino Derrick 03/20/2011, 5:58 PM  Chrystie Nose, MD Attending Cardiologist [facility]

## 2011-03-20 NOTE — Progress Notes (Signed)
ANTICOAGULATION CONSULT NOTE   Pharmacy Consult for UFH Indication: atrial fibrillation  No Known Allergies  Patient Measurements: Height: 5\' 3"  (160 cm) Weight: 155 lb 10.3 oz (70.6 kg) IBW/kg (Calculated) : 52.4  Adjusted Body Weight: 53  Vital Signs: Temp: 98.2 F (36.8 C) (11/07 0557) Temp src: Oral (11/07 0557) BP: 135/78 mmHg (11/07 1044) Pulse Rate: 98  (11/07 1044)  Labs:  Basename 03/20/11 1033 03/20/11 0046 03/19/11 1641 03/19/11 1108  HGB 13.6 14.2 -- --  HCT 39.7 41.1 -- 42.1  PLT 158 160 -- 158  APTT -- -- 29 --  LABPROT -- -- 14.4 --  INR -- -- 1.10 --  HEPARINUNFRC 0.25* 0.23* -- --  CREATININE -- -- -- 0.91  CKTOTAL -- -- -- --  CKMB -- -- -- --  TROPONINI -- -- -- <0.30   Estimated Creatinine Clearance: 44.1 ml/min (by C-G formula based on Cr of 0.91).  Medical History: Past Medical History  Diagnosis Date  . Personal history of colonic polyps   . Diverticulosis of colon (without mention of hemorrhage)   . COPD (chronic obstructive pulmonary disease)   . Dysfunction of eustachian tube   . Atrial fibrillation   . Allergic rhinitis   . Hypertrophic cardiomyopathy   . Fibromuscular dysplasia   . Aneurysm   . HTN (hypertension)   . Hyperlipidemia     Medications:  Prescriptions prior to admission  Medication Sig Dispense Refill  . amiodarone (PACERONE) 200 MG tablet Take 200 mg by mouth daily.       Marland Kitchen aspirin 81 MG tablet Take 81 mg by mouth daily.        . calcium carbonate (OS-CAL - DOSED IN MG OF ELEMENTAL CALCIUM) 1250 MG tablet Take 1 tablet by mouth 2 (two) times daily.        . clopidogrel (PLAVIX) 75 MG tablet Take 75 mg by mouth at bedtime.       Marland Kitchen diltiazem (CARDIZEM CD) 120 MG 24 hr capsule Take 120 mg by mouth daily.        Marland Kitchen HYDROcodone-homatropine (HYCODAN) 5-1.5 MG/5ML syrup Take 5 mLs by mouth at bedtime as needed. 1 tsp every 6 hours as needed for cough.       . loratadine (CLARITIN) 10 MG tablet Take 10 mg by mouth daily as  needed.        Marland Kitchen losartan (COZAAR) 100 MG tablet Take 100 mg by mouth daily.        Marland Kitchen losartan (COZAAR) 50 MG tablet Take 100 mg by mouth daily.       . Multiple Vitamins-Minerals (MULTIVITAMINS THER. W/MINERALS) TABS Take 1 tablet by mouth every morning.        . Omega-3 Fatty Acids (FISH OIL) 1000 MG CAPS Take 2 capsules by mouth 2 (two) times daily.       Marland Kitchen omeprazole (PRILOSEC) 40 MG capsule Take 40 mg by mouth daily.        . rosuvastatin (CRESTOR) 5 MG tablet Take 5 mg by mouth at bedtime.       . vitamin E 400 UNIT capsule Take 800 Units by mouth daily.        Marland Kitchen ipratropium (ATROVENT) 0.06 % nasal spray Place 2 sprays into the nose daily as needed.        . pantoprazole (PROTONIX) 40 MG tablet Take 40 mg by mouth daily.          Assessment: 77 y/o female with Afib for anticoagulation.  Heparin level below-goal. No problem with line per RN.  Goal of Therapy:  Heparin level 0.3-0.7 units/ml   Plan:  Heparin 1000 units IV bolus, then increase to 1050 units/hr.  Check 8 hr level Emeline Gins 03/20/2011,12:18 PM

## 2011-03-20 NOTE — Progress Notes (Signed)
ANTICOAGULATION CONSULT NOTE   Pharmacy Consult for UFH Indication: atrial fibrillation  No Known Allergies  Patient Measurements: Height: 5\' 3"  (160 cm) Weight: 155 lb 10.3 oz (70.6 kg) IBW/kg (Calculated) : 52.4  Adjusted Body Weight: 53  Vital Signs: Temp: 98.5 F (36.9 C) (11/06 1900) Temp src: Oral (11/06 1900) BP: 118/71 mmHg (11/06 1900) Pulse Rate: 77  (11/06 1900)  Labs:  Basename 03/20/11 0046 03/19/11 1641 03/19/11 1108  HGB 14.2 -- 14.7  HCT 41.1 -- 42.1  PLT 160 -- 158  APTT -- 29 --  LABPROT -- 14.4 --  INR -- 1.10 --  HEPARINUNFRC 0.23* -- --  CREATININE -- -- 0.91  CKTOTAL -- -- --  CKMB -- -- --  TROPONINI -- -- <0.30   Estimated Creatinine Clearance: 44.1 ml/min (by C-G formula based on Cr of 0.91).  Medical History: Past Medical History  Diagnosis Date  . Personal history of colonic polyps   . Diverticulosis of colon (without mention of hemorrhage)   . COPD (chronic obstructive pulmonary disease)   . Dysfunction of eustachian tube   . Atrial fibrillation   . Allergic rhinitis   . Hypertrophic cardiomyopathy   . Fibromuscular dysplasia   . Aneurysm   . HTN (hypertension)   . Hyperlipidemia     Medications:  Prescriptions prior to admission  Medication Sig Dispense Refill  . amiodarone (PACERONE) 200 MG tablet Take 200 mg by mouth daily.       Marland Kitchen aspirin 81 MG tablet Take 81 mg by mouth daily.        . calcium carbonate (OS-CAL - DOSED IN MG OF ELEMENTAL CALCIUM) 1250 MG tablet Take 1 tablet by mouth 2 (two) times daily.        . clopidogrel (PLAVIX) 75 MG tablet Take 75 mg by mouth at bedtime.       Marland Kitchen diltiazem (CARDIZEM CD) 120 MG 24 hr capsule Take 120 mg by mouth daily.        Marland Kitchen HYDROcodone-homatropine (HYCODAN) 5-1.5 MG/5ML syrup Take 5 mLs by mouth at bedtime as needed. 1 tsp every 6 hours as needed for cough.       . loratadine (CLARITIN) 10 MG tablet Take 10 mg by mouth daily as needed.        Marland Kitchen losartan (COZAAR) 100 MG tablet Take  100 mg by mouth daily.        Marland Kitchen losartan (COZAAR) 50 MG tablet Take 100 mg by mouth daily.       . Multiple Vitamins-Minerals (MULTIVITAMINS THER. W/MINERALS) TABS Take 1 tablet by mouth every morning.        . Omega-3 Fatty Acids (FISH OIL) 1000 MG CAPS Take 2 capsules by mouth 2 (two) times daily.       Marland Kitchen omeprazole (PRILOSEC) 40 MG capsule Take 40 mg by mouth daily.        . rosuvastatin (CRESTOR) 5 MG tablet Take 5 mg by mouth at bedtime.       . vitamin E 400 UNIT capsule Take 800 Units by mouth daily.        Marland Kitchen ipratropium (ATROVENT) 0.06 % nasal spray Place 2 sprays into the nose daily as needed.        . pantoprazole (PROTONIX) 40 MG tablet Take 40 mg by mouth daily.          Assessment: 77 y/o female with Afib for anticoagulation.  Heparin level subtherapeutic.  Goal of Therapy:  Heparin level 0.3-0.7 units/ml  Plan:  Heparin 1500 units IV bolus, then increase to 900 units/hr.  Check 6 hr level Brendon Christoffel, Gary Fleet 03/20/2011,2:34 AM

## 2011-03-21 ENCOUNTER — Encounter (HOSPITAL_COMMUNITY): Payer: Self-pay

## 2011-03-21 ENCOUNTER — Ambulatory Visit (HOSPITAL_COMMUNITY): Admit: 2011-03-21 | Payer: Self-pay | Admitting: Internal Medicine

## 2011-03-21 SURGERY — CARDIOVERSION
Anesthesia: Moderate Sedation

## 2011-03-21 SURGERY — ECHOCARDIOGRAM, TRANSESOPHAGEAL
Anesthesia: Moderate Sedation

## 2011-03-22 ENCOUNTER — Ambulatory Visit (INDEPENDENT_AMBULATORY_CARE_PROVIDER_SITE_OTHER): Payer: Medicare Other

## 2011-03-22 DIAGNOSIS — J309 Allergic rhinitis, unspecified: Secondary | ICD-10-CM

## 2011-03-27 ENCOUNTER — Ambulatory Visit (INDEPENDENT_AMBULATORY_CARE_PROVIDER_SITE_OTHER): Payer: Medicare Other

## 2011-03-27 DIAGNOSIS — J309 Allergic rhinitis, unspecified: Secondary | ICD-10-CM

## 2011-04-02 ENCOUNTER — Ambulatory Visit (INDEPENDENT_AMBULATORY_CARE_PROVIDER_SITE_OTHER): Payer: Medicare Other

## 2011-04-02 DIAGNOSIS — J309 Allergic rhinitis, unspecified: Secondary | ICD-10-CM

## 2011-04-09 ENCOUNTER — Other Ambulatory Visit: Payer: Self-pay | Admitting: Cardiovascular Disease

## 2011-04-11 ENCOUNTER — Encounter (HOSPITAL_COMMUNITY): Payer: Self-pay | Admitting: Pharmacy Technician

## 2011-04-12 ENCOUNTER — Ambulatory Visit (HOSPITAL_COMMUNITY): Payer: Medicare Other | Admitting: Anesthesiology

## 2011-04-12 ENCOUNTER — Encounter (HOSPITAL_COMMUNITY): Payer: Self-pay | Admitting: *Deleted

## 2011-04-12 ENCOUNTER — Ambulatory Visit (HOSPITAL_COMMUNITY)
Admission: RE | Admit: 2011-04-12 | Discharge: 2011-04-12 | Disposition: A | Discharge status: Home / Self Care | Payer: Medicare Other | Source: Ambulatory Visit | Attending: Cardiovascular Disease | Admitting: Cardiovascular Disease

## 2011-04-12 ENCOUNTER — Encounter (HOSPITAL_COMMUNITY): Admission: RE | Disposition: A | Discharge status: Home / Self Care | Payer: Self-pay | Source: Ambulatory Visit

## 2011-04-12 ENCOUNTER — Encounter (HOSPITAL_COMMUNITY): Payer: Self-pay | Admitting: Anesthesiology

## 2011-04-12 DIAGNOSIS — I4891 Unspecified atrial fibrillation: Secondary | ICD-10-CM | POA: Insufficient documentation

## 2011-04-12 HISTORY — DX: Gastro-esophageal reflux disease without esophagitis: K21.9

## 2011-04-12 HISTORY — DX: Malignant (primary) neoplasm, unspecified: C80.1

## 2011-04-12 HISTORY — PX: TEE WITHOUT CARDIOVERSION: SHX5443

## 2011-04-12 HISTORY — DX: Reserved for inherently not codable concepts without codable children: IMO0001

## 2011-04-12 HISTORY — PX: CARDIOVERSION: SHX1299

## 2011-04-12 HISTORY — DX: Encounter for other specified aftercare: Z51.89

## 2011-04-12 HISTORY — DX: Cardiac murmur, unspecified: R01.1

## 2011-04-12 HISTORY — DX: Diaphragmatic hernia without obstruction or gangrene: K44.9

## 2011-04-12 HISTORY — DX: Unspecified osteoarthritis, unspecified site: M19.90

## 2011-04-12 SURGERY — CARDIOVERSION
Anesthesia: Monitor Anesthesia Care

## 2011-04-12 SURGERY — ECHOCARDIOGRAM, TRANSESOPHAGEAL
Anesthesia: Moderate Sedation

## 2011-04-12 MED ORDER — BENZOCAINE 20 % MT SOLN: 1.0000 "application " | OROMUCOSAL | Status: DC | PRN

## 2011-04-12 MED ORDER — MIDAZOLAM HCL 10 MG/2ML IJ SOLN
INTRAMUSCULAR | Status: AC
  Filled 2011-04-12: qty 2

## 2011-04-12 MED ORDER — FLUMAZENIL 0.5 MG/5ML IV SOLN
INTRAVENOUS | Status: DC | PRN
  Administered 2011-04-12: 0.3 mg via INTRAVENOUS

## 2011-04-12 MED ORDER — BUTAMBEN-TETRACAINE-BENZOCAINE 2-2-14 % EX AERO
INHALATION_SPRAY | CUTANEOUS | Status: DC | PRN
  Administered 2011-04-12: 2 via TOPICAL

## 2011-04-12 MED ORDER — FENTANYL CITRATE 0.05 MG/ML IJ SOLN
INTRAMUSCULAR | Status: DC | PRN
  Administered 2011-04-12 (×2): 25 ug via INTRAVENOUS

## 2011-04-12 MED ORDER — FENTANYL CITRATE 0.05 MG/ML IJ SOLN
INTRAMUSCULAR | Status: AC
  Filled 2011-04-12: qty 2

## 2011-04-12 MED ORDER — MIDAZOLAM HCL 10 MG/2ML IJ SOLN
INTRAMUSCULAR | Status: DC | PRN
  Administered 2011-04-12: 2 mg via INTRAVENOUS

## 2011-04-12 MED ORDER — FLUMAZENIL 0.5 MG/5ML IV SOLN
INTRAVENOUS | Status: AC
  Filled 2011-04-12: qty 5

## 2011-04-12 MED ORDER — PROPOFOL 10 MG/ML IV EMUL
INTRAVENOUS | Status: DC | PRN
  Administered 2011-04-12: 60 mg via INTRAVENOUS

## 2011-04-12 MED ORDER — SODIUM CHLORIDE 0.45 % IV SOLN
INTRAVENOUS | Status: DC
  Administered 2011-04-12: 100 mL via INTRAVENOUS
  Administered 2011-04-12: 400 mL via INTRAVENOUS

## 2011-04-12 NOTE — Preoperative (Signed)
Beta Blockers   Reason not to administer Beta Blockers:Not Applicable 

## 2011-04-12 NOTE — H&P (Signed)
Date of Initial H&P: 03/20/11  History reviewed, patient examined, no change in status, stable for surgery.  Remains in atrial fibrillation. Only anticoagulated for last 3 weeks. Plan TEE cardioversion today.  Thurmon Fair, MD, [facility] [phone] 11:48 AM

## 2011-04-12 NOTE — Addendum Note (Signed)
Addendum  created 04/12/11 1710 by Charletta Voight Coker Brysan Mcevoy, MD   Modules edited:Notes Section    

## 2011-04-12 NOTE — Transfer of Care (Signed)
Immediate Anesthesia Transfer of Care Note  Patient: [name]  Procedure(s) Performed:  TRANSESOPHAGEAL ECHOCARDIOGRAM (TEE); CARDIOVERSION  Patient Location: PACU  Anesthesia Type: MAC  Level of Consciousness: awake and alert   Airway & Oxygen Therapy: Patient Spontanous Breathing  Post-op Assessment: Report given to PACU RN  Post vital signs: Reviewed and stable  Complications: No apparent anesthesia complications

## 2011-04-12 NOTE — Anesthesia Postprocedure Evaluation (Deleted)
  Anesthesia Post-op Note  Patient: [name]  Procedure(s) Performed:  TRANSESOPHAGEAL ECHOCARDIOGRAM (TEE); CARDIOVERSION  Patient Location: PACU and Endoscopy Unit  Anesthesia Type: General  Level of Consciousness: awake, alert  and oriented  Airway and Oxygen Therapy: Patient Spontanous Breathing  Post-op Pain: none  Post-op Assessment: Post-op Vital signs reviewed and Patient's Cardiovascular Status Stable  Post-op Vital Signs: stable  Complications: No apparent anesthesia complications

## 2011-04-12 NOTE — Addendum Note (Signed)
Addendum  created 04/12/11 1702 by Melonie Florida, MD   Modules edited:Notes Section

## 2011-04-12 NOTE — Anesthesia Postprocedure Evaluation (Signed)
  Anesthesia Post-op Note  Patient: [name]  Procedure(s) Performed:  TRANSESOPHAGEAL ECHOCARDIOGRAM (TEE); CARDIOVERSION  Patient Location: PACU  Anesthesia Type: MAC  Level of Consciousness: awake and alert   Airway and Oxygen Therapy: Patient Spontanous Breathing and Patient connected to nasal cannula oxygen  Post-op Pain: none  Post-op Assessment: Post-op Vital signs reviewed and Patient's Cardiovascular Status Stable  Post-op Vital Signs: Reviewed and stable  Complications: No apparent anesthesia complications

## 2011-04-12 NOTE — Procedures (Signed)
[facility]  TEE/CARDIOVERSION NOTE   TRANSESOPHAGEAL ECHOCARDIOGRAM (TEE):  Indictation: Atrial Fibrillation pre cardioversion  Consent:   Informed consent was obtained prior to the procedure. The risks, benefits and alternatives for the procedure were discussed and the patient comprehended these risks.  Risks include, but are not limited to, cough, sore throat, vomiting, nausea, somnolence, esophageal and stomach trauma or perforation, bleeding, low blood pressure, aspiration, pneumonia, infection, trauma to the teeth and death.    Time Out: Verified patient identification, verified procedure, site/side was marked, verified correct patient position, special equipment/implants available, medications/allergies/relevent history reviewed, required imaging and test results available. Performed  Procedure:  After a procedural time-out, the patient was given 2 mg versed and 50 mcg fentanyl for moderate sedation.  The oropharynx was anesthetized with topical Cetacaine spray.  The transesophageal probe was inserted in the esophagus and stomach without difficulty and multiple views were obtained.    Agitated microbubble saline contrast was not administered.  Complications:    Complications: None Patient did tolerate procedure well.  Findings:  1. LEFT VENTRICLE: The left ventricle is normal in structure and function without any thrombus or masses. LVEF is 60-65%.there is mild left ventricular hypertrophy. There is normal regional wall motion.  2. RIGHT VENTRICLE:  The right ventricle is normal in structure and function without any thrombus or masses.    3. LEFT ATRIUM:  The left atrium is normal without any thrombus or masses.the atrial septum is intact without evidence of shunt  4. LEFT ATRIAL APPENDAGE:  The left atrial appendage is free of any thrombus or masses. Atrial appendage emptying velocities are normal despite atrial fibrillation  5. ATRIAL  SEPTUM:  The atrial septum is free of any thrombus or masses.  There is no evidence for interatrial shunting by color doppler and saline microbubble.  6. RIGHT ATRIUM:  The right atrium is normal in size and function without any thrombus or masses.  7. MITRAL VALVE:  The mitral valve is normal in structure and function with trace regurgitation.  There were no vegetations or stenosis.  8. AORTIC VALVE:  The aortic valve is trileaflet and shows extensive sclerosis and moderate calcification. The left coronary cusp is completely rigid but the other 2 cusps half abnormal mobility..  There were no vegetations or stenosis. There is mild aortic regurgitation there is no significant aortic stenosis.   9. TRICUSPID VALVE:  The tricuspid valve is normal in structure and function with mild-to-moderate regurgitation.  There were no vegetations or stenosis  10.  PULMONIC VALVE:  The tricuspid valve is normal in structure and function without regurgitation.  There were no vegetations or stenosis.   11. AORTIC ARCH, ASCENDING AND DESCENDING AORTA:  There was mild atherosclerosis of the descending, arch and ascending aorta  CARDIOVERSION:     Time Out: Verified patient identification, verified procedure, site/side was marked, verified correct patient position, special equipment/implants available, medications/allergies/relevent history reviewed, required imaging and test results available.  Performed  Procedure:  1. Patient placed on cardiac monitor, pulse oximetry, supplemental oxygen as necessary.  2. Sedation administered per anesthesia 3. Pacer pads placed anterior and posterior chest. 4. Cardioverted 1 time(s).  5. Cardioverted at 120J.synchronized biphasic shock. 6. The emergent rhythm was sinus bradycardia at 45 beats per minute gradually increasing to 50 beats per minute.  Complications:  Complications: None Patient did tolerate procedure well.  Impression:  1.  successful  cardioversion  Recommendations:  1.  continue pradaxa for a minimum of  4 weeks; continue antiarrhythmic therapy with amiodarone; monitor for bradycardia  Time Spent Directly with the Patient:  60 minutes   Thurmon Fair, MD, [facility] [phone]   04/12/2011, 1:27 PM

## 2011-04-12 NOTE — Anesthesia Preprocedure Evaluation (Addendum)
Anesthesia Evaluation  Patient identified by MRN, date of birth, ID band Patient unresponsive    Reviewed: Allergy & Precautions, H&P , NPO status , Patient's Chart, lab work & pertinent test results, reviewed documented beta blocker date and time   Airway       Dental   Pulmonary shortness of breath, pneumonia , COPD         Cardiovascular hypertension, Pt. on medications and Pt. on home beta blockers     Neuro/Psych    GI/Hepatic hiatal hernia, GERD-  Medicated,  Endo/Other    Renal/GU      Musculoskeletal   Abdominal   Peds  Hematology   Anesthesia Other Findings   Reproductive/Obstetrics                           Anesthesia Physical Anesthesia Plan  ASA: III  Anesthesia Plan: MAC   Post-op Pain Management:    Induction: Intravenous  Airway Management Planned:   Additional Equipment:   Intra-op Plan:   Post-operative Plan:   Informed Consent: I have reviewed the patients History and Physical, chart, labs and discussed the procedure including the risks, benefits and alternatives for the proposed anesthesia with the patient or authorized representative who has indicated his/her understanding and acceptance.   Dental advisory given  Plan Discussed with: CRNA and Anesthesiologist  Anesthesia Plan Comments:         Anesthesia Quick Evaluation

## 2011-04-12 NOTE — Progress Notes (Signed)
  Echocardiogram Echocardiogram Transesophageal has been performed.  Jorje Guild [facility] 04/12/2011, 3:00 PM

## 2011-04-16 ENCOUNTER — Ambulatory Visit (INDEPENDENT_AMBULATORY_CARE_PROVIDER_SITE_OTHER): Payer: Medicare Other

## 2011-04-16 DIAGNOSIS — J309 Allergic rhinitis, unspecified: Secondary | ICD-10-CM

## 2011-04-17 ENCOUNTER — Encounter (HOSPITAL_COMMUNITY): Payer: Self-pay | Admitting: Cardiovascular Disease

## 2011-04-25 ENCOUNTER — Ambulatory Visit (INDEPENDENT_AMBULATORY_CARE_PROVIDER_SITE_OTHER): Payer: Medicare Other

## 2011-04-25 DIAGNOSIS — J309 Allergic rhinitis, unspecified: Secondary | ICD-10-CM

## 2011-05-01 ENCOUNTER — Ambulatory Visit (INDEPENDENT_AMBULATORY_CARE_PROVIDER_SITE_OTHER): Payer: Medicare Other

## 2011-05-01 DIAGNOSIS — J309 Allergic rhinitis, unspecified: Secondary | ICD-10-CM

## 2011-05-03 ENCOUNTER — Ambulatory Visit (INDEPENDENT_AMBULATORY_CARE_PROVIDER_SITE_OTHER): Payer: Medicare Other

## 2011-05-03 DIAGNOSIS — J309 Allergic rhinitis, unspecified: Secondary | ICD-10-CM

## 2011-05-09 ENCOUNTER — Ambulatory Visit (INDEPENDENT_AMBULATORY_CARE_PROVIDER_SITE_OTHER): Payer: Medicare Other | Admitting: Internal Medicine

## 2011-05-09 ENCOUNTER — Ambulatory Visit (INDEPENDENT_AMBULATORY_CARE_PROVIDER_SITE_OTHER): Payer: Medicare Other

## 2011-05-09 ENCOUNTER — Encounter: Payer: Self-pay | Admitting: Internal Medicine

## 2011-05-09 VITALS — BP 136/82 | HR 87 | Resp 18 | Ht 62.0 in | Wt 158.0 lb

## 2011-05-09 DIAGNOSIS — I4891 Unspecified atrial fibrillation: Secondary | ICD-10-CM

## 2011-05-09 DIAGNOSIS — I452 Bifascicular block: Secondary | ICD-10-CM

## 2011-05-09 DIAGNOSIS — J309 Allergic rhinitis, unspecified: Secondary | ICD-10-CM

## 2011-05-09 NOTE — Patient Instructions (Signed)
Stop Aspirin  We will see you back as needed.

## 2011-05-09 NOTE — Assessment & Plan Note (Signed)
Asymptomatic Caution with AV nodal agents long term She is presently well rate controlled.
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
## 2011-05-09 NOTE — Assessment & Plan Note (Signed)
The patient has symptomatic persistent atrial fibrillation refractory to medical therapy with amiodarone. Therapeutic strategies for afib including rate control and rhythm control were discussed today.  Further treatment with medicine and catheter ablation were discussed in detail with the patient today. Risk, benefits, and alternatives to EP study and radiofrequency ablation for afib were also discussed in detail today.  She understands that given her persistent afib refractory to medical therapy with amiodarone, her anticipated success rates from ablation are on the order of 50-60%, with 1 in 3 patients requiring multiple procedures.  She is clear that she would like to avoid ablation with these odds.   Given her advanced age, I would agree and therefore would recommend a rate control strategy going forward.  She should continue long term anticoagulation with pradaxa.  No changes are therefore made today. She will continue to follow with Dr Allyson Sabal and I will see her as needed.

## 2011-05-15 ENCOUNTER — Ambulatory Visit (INDEPENDENT_AMBULATORY_CARE_PROVIDER_SITE_OTHER): Payer: Medicare Other

## 2011-05-15 DIAGNOSIS — J309 Allergic rhinitis, unspecified: Secondary | ICD-10-CM | POA: Diagnosis not present

## 2011-05-15 DIAGNOSIS — Z79899 Other long term (current) drug therapy: Secondary | ICD-10-CM | POA: Diagnosis not present

## 2011-05-22 ENCOUNTER — Ambulatory Visit (INDEPENDENT_AMBULATORY_CARE_PROVIDER_SITE_OTHER): Payer: Medicare Other

## 2011-05-22 DIAGNOSIS — J309 Allergic rhinitis, unspecified: Secondary | ICD-10-CM | POA: Diagnosis not present

## 2011-05-29 ENCOUNTER — Emergency Department (HOSPITAL_COMMUNITY)
Admission: EM | Admit: 2011-05-29 | Discharge: 2011-05-29 | Disposition: A | Discharge status: Home / Self Care | Payer: Medicare Other | Attending: Emergency Medicine | Admitting: Emergency Medicine

## 2011-05-29 ENCOUNTER — Other Ambulatory Visit: Payer: Self-pay

## 2011-05-29 ENCOUNTER — Emergency Department (HOSPITAL_COMMUNITY): Payer: Medicare Other

## 2011-05-29 ENCOUNTER — Encounter (HOSPITAL_COMMUNITY): Payer: Self-pay | Admitting: *Deleted

## 2011-05-29 DIAGNOSIS — Z79899 Other long term (current) drug therapy: Secondary | ICD-10-CM | POA: Diagnosis not present

## 2011-05-29 DIAGNOSIS — E785 Hyperlipidemia, unspecified: Secondary | ICD-10-CM | POA: Diagnosis not present

## 2011-05-29 DIAGNOSIS — I517 Cardiomegaly: Secondary | ICD-10-CM | POA: Diagnosis not present

## 2011-05-29 DIAGNOSIS — J4489 Other specified chronic obstructive pulmonary disease: Secondary | ICD-10-CM | POA: Insufficient documentation

## 2011-05-29 DIAGNOSIS — I10 Essential (primary) hypertension: Secondary | ICD-10-CM | POA: Insufficient documentation

## 2011-05-29 DIAGNOSIS — J449 Chronic obstructive pulmonary disease, unspecified: Secondary | ICD-10-CM | POA: Diagnosis not present

## 2011-05-29 DIAGNOSIS — K219 Gastro-esophageal reflux disease without esophagitis: Secondary | ICD-10-CM | POA: Diagnosis not present

## 2011-05-29 DIAGNOSIS — M129 Arthropathy, unspecified: Secondary | ICD-10-CM | POA: Insufficient documentation

## 2011-05-29 DIAGNOSIS — R5383 Other fatigue: Secondary | ICD-10-CM | POA: Insufficient documentation

## 2011-05-29 DIAGNOSIS — I4891 Unspecified atrial fibrillation: Secondary | ICD-10-CM | POA: Diagnosis not present

## 2011-05-29 DIAGNOSIS — R5381 Other malaise: Secondary | ICD-10-CM | POA: Diagnosis not present

## 2011-05-29 DIAGNOSIS — R531 Weakness: Secondary | ICD-10-CM

## 2011-05-29 DIAGNOSIS — R404 Transient alteration of awareness: Secondary | ICD-10-CM | POA: Diagnosis not present

## 2011-05-29 DIAGNOSIS — R0602 Shortness of breath: Secondary | ICD-10-CM | POA: Diagnosis not present

## 2011-05-29 LAB — COMPREHENSIVE METABOLIC PANEL
Alkaline Phosphatase: 69 U/L (ref 39–117)
BUN: 20 mg/dL (ref 6–23)
CO2: 25 mEq/L (ref 19–32)
Chloride: 105 mEq/L (ref 96–112)
Creatinine, Ser: 1.08 mg/dL (ref 0.50–1.10)
GFR calc Af Amer: 53 mL/min — ABNORMAL LOW (ref >90)
GFR calc non Af Amer: 46 mL/min — ABNORMAL LOW (ref >90)
Glucose, Bld: 89 mg/dL (ref 70–99)
Potassium: 3.9 mEq/L (ref 3.5–5.1)
Total Bilirubin: 0.6 mg/dL (ref 0.3–1.2)

## 2011-05-29 LAB — DIFFERENTIAL
Lymphs Abs: 1.7 10*3/uL (ref 0.7–4.0)
Monocytes Absolute: 0.8 10*3/uL (ref 0.1–1.0)
Monocytes Relative: 11 % (ref 3–12)
Neutro Abs: 4.5 10*3/uL (ref 1.7–7.7)
Neutrophils Relative %: 64 % (ref 43–77)

## 2011-05-29 LAB — CBC
HCT: 43.6 % (ref 36.0–46.0)
Hemoglobin: 14.8 g/dL (ref 12.0–15.0)
RBC: 4.51 MIL/uL (ref 3.87–5.11)

## 2011-05-29 LAB — URINALYSIS, ROUTINE W REFLEX MICROSCOPIC
Glucose, UA: NEGATIVE mg/dL
Hgb urine dipstick: NEGATIVE
Specific Gravity, Urine: 1.008 (ref 1.005–1.030)

## 2011-05-29 LAB — URINE MICROSCOPIC-ADD ON

## 2011-05-29 LAB — TROPONIN I: Troponin I: <0.3 ng/mL (ref <0.30)

## 2011-05-29 NOTE — ED Notes (Signed)
Pt states that she had weakness and dizziness at home. EMS was called and brought her in. She began feeling better at home. States that she currently has no complaints. Pulses present in all extremities. Blood pressure since arrival has remained WNL. Pt and husband have concerns that this is due to medications. Pt had a previous episode on Friday that was accompanied by a severe headache. Denies headache today.
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
## 2011-05-30 ENCOUNTER — Ambulatory Visit (INDEPENDENT_AMBULATORY_CARE_PROVIDER_SITE_OTHER): Payer: Medicare Other

## 2011-05-30 DIAGNOSIS — J309 Allergic rhinitis, unspecified: Secondary | ICD-10-CM

## 2011-06-03 ENCOUNTER — Encounter: Payer: Self-pay | Admitting: Gastroenterology

## 2011-06-05 ENCOUNTER — Ambulatory Visit (INDEPENDENT_AMBULATORY_CARE_PROVIDER_SITE_OTHER): Payer: Medicare Other

## 2011-06-05 DIAGNOSIS — J309 Allergic rhinitis, unspecified: Secondary | ICD-10-CM | POA: Diagnosis not present

## 2011-06-07 DIAGNOSIS — R609 Edema, unspecified: Secondary | ICD-10-CM | POA: Diagnosis not present

## 2011-06-07 DIAGNOSIS — I10 Essential (primary) hypertension: Secondary | ICD-10-CM | POA: Diagnosis not present

## 2011-06-07 DIAGNOSIS — I4891 Unspecified atrial fibrillation: Secondary | ICD-10-CM | POA: Diagnosis not present

## 2011-06-11 ENCOUNTER — Telehealth: Payer: Self-pay | Admitting: Gastroenterology

## 2011-06-11 NOTE — Telephone Encounter (Signed)
Spoke with [name] and she requested an OV with Dr. [name] to see if she needs to schedule a colonoscopy. [name] scheduled to see Dr. [name] 06/21/11@11 :15am. [name] aware of appt date and time.

## 2011-06-12 ENCOUNTER — Ambulatory Visit (INDEPENDENT_AMBULATORY_CARE_PROVIDER_SITE_OTHER): Payer: Medicare Other

## 2011-06-12 DIAGNOSIS — J309 Allergic rhinitis, unspecified: Secondary | ICD-10-CM | POA: Diagnosis not present

## 2011-06-13 ENCOUNTER — Encounter: Payer: Self-pay | Admitting: Internal Medicine

## 2011-06-21 ENCOUNTER — Ambulatory Visit (INDEPENDENT_AMBULATORY_CARE_PROVIDER_SITE_OTHER): Payer: Medicare Other

## 2011-06-21 ENCOUNTER — Ambulatory Visit (INDEPENDENT_AMBULATORY_CARE_PROVIDER_SITE_OTHER): Payer: Medicare Other | Admitting: Gastroenterology

## 2011-06-21 ENCOUNTER — Encounter: Payer: Self-pay | Admitting: Gastroenterology

## 2011-06-21 DIAGNOSIS — I4891 Unspecified atrial fibrillation: Secondary | ICD-10-CM

## 2011-06-21 DIAGNOSIS — J309 Allergic rhinitis, unspecified: Secondary | ICD-10-CM | POA: Diagnosis not present

## 2011-06-21 DIAGNOSIS — Z860100 Personal history of colon polyps, unspecified: Secondary | ICD-10-CM | POA: Insufficient documentation

## 2011-06-21 NOTE — Assessment & Plan Note (Signed)
Patient remains on pradaxa goes intermittent facial fibrillation.
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
## 2011-06-21 NOTE — Assessment & Plan Note (Addendum)
The patient is asymptomatic. At this time I believe that the risks for  colonoscopy and holding her anticoagulation therapy outweighs the benefit for colonoscopy. Accordingly, this exam will be deferred at this time.

## 2011-06-21 NOTE — Patient Instructions (Signed)
Follow Up as needed.

## 2011-06-26 ENCOUNTER — Ambulatory Visit (INDEPENDENT_AMBULATORY_CARE_PROVIDER_SITE_OTHER): Payer: Medicare Other

## 2011-06-26 DIAGNOSIS — J309 Allergic rhinitis, unspecified: Secondary | ICD-10-CM | POA: Diagnosis not present

## 2011-07-02 ENCOUNTER — Encounter: Payer: Self-pay | Admitting: Internal Medicine

## 2011-07-02 ENCOUNTER — Ambulatory Visit (INDEPENDENT_AMBULATORY_CARE_PROVIDER_SITE_OTHER): Payer: Medicare Other | Admitting: Internal Medicine

## 2011-07-02 ENCOUNTER — Ambulatory Visit (INDEPENDENT_AMBULATORY_CARE_PROVIDER_SITE_OTHER): Payer: Medicare Other

## 2011-07-02 VITALS — BP 108/80 | HR 83 | Ht 63.0 in | Wt 155.4 lb

## 2011-07-02 DIAGNOSIS — J301 Allergic rhinitis due to pollen: Secondary | ICD-10-CM

## 2011-07-02 DIAGNOSIS — J309 Allergic rhinitis, unspecified: Secondary | ICD-10-CM

## 2011-07-02 DIAGNOSIS — I4891 Unspecified atrial fibrillation: Secondary | ICD-10-CM

## 2011-07-02 MED ORDER — IPRATROPIUM BROMIDE 0.06 % NA SOLN: 2.0000 | Freq: Four times a day (QID) | NASAL | Status: DC

## 2011-07-02 NOTE — Progress Notes (Signed)
Patient ID: [name], female    DOB: December 28, 1925, 79 y.o.   MRN: [id number]  HPI 12/28/10- 78 year old female former smoker followed for allergic rhinitis eustachian dysfunction complicated by history of atrial fibrillation/amiodarone. Last here June 29, 2010  Disliked the heat this summer but otherwise denies respiratory problems since last here.  Allergy vaccine- ok, getting shots here. Still uses ipratropium nasal spray for rhinorhea when going out, but doesn't bother with it if she is staying home. CXR 06/29/10- clear, NAD, with emphysema/ COPD change, No indication of amiodarone related changes. Got cortisone shot in knee for gout 4 days ago.  07/02/11-  78 year old female former smoker followed for allergic rhinitis, eustachian dysfunction complicated by history of atrial fibrillation/amiodarone. Stable on allergy vaccine. No change in her sense of shortness of breath with exertion which has been  present for years. Stopped amiodarone in October of 2012. Cardioversion was unsuccessful. Some coughing spells without wheezing. Denies anemia or chest pain. Paces herself. Asks refill for ipratropium nasal spray. Nasal stuffiness contributes to her sense of shortness of breath. PFT 12/15/2007-FEV1 1.91/114% without response to bronchodilator, FEV1/FVC 0.71, TLC 104%, DLCO 60%. CXR-  IMPRESSION: 05/29/11 Mild cardiomegaly without acute disease.  Original Report Authenticated By: Bernadene Bell. Maricela Curet, M.D.     Review of Systems Constitutional:   No-   weight loss, night sweats, fevers, chills, fatigue, lassitude. HEENT:   No-  headaches, difficulty swallowing, tooth/dental problems, sore throat,       No-  sneezing, itching, ear ache, +nasal congestion, post nasal drip,  CV:  No-   chest pain, orthopnea, PND, swelling in lower extremities, anasarca, dizziness, palpitations Resp:+  shortness of breath with exertion or at rest.              No-   productive cough,  No non-productive  cough,  No-  coughing up of blood.              No-   change in color of mucus.  No- wheezing.   Skin: No-   rash or lesions. GI:  No-   heartburn, indigestion, abdominal pain, nausea, vomiting, diarrhea,                 change in bowel habits, loss of appetite GU: MS:  + recent  joint pain or swelling.  No- decreased range of motion.  No- back pain. Neuro- grossly normal to observation, Or:  Psych:  No- change in mood or affect. No depression or anxiety.  No memory loss.      Objective:   Physical Exam General- Alert, Oriented, Affect-appropriate, Distress- none acute Skin- rash-none, lesions- none, excoriation- none Lymphadenopathy- none Head- atraumatic            Eyes- Gross vision intact, PERRLA, conjunctivae clear secretions            Ears- Hearing, canals normal            Nose- Clear, No-Septal dev, mucus, polyps, erosion, perforation             Throat- Mallampati II , mucosa clear , drainage- none, tonsils- atrophic Neck- flexible , trachea midline, no stridor , thyroid nl, carotid no bruit Chest - symmetrical excursion , unlabored           Heart/CV- IRR with occasional extra beat , 1-2/6 systolic murmur AS , no gallop  , no rub, nl s1 s2                           -  JVD- none , edema- none, stasis changes- none, varices- none           Lung- clear to P&A, wheeze- none, light cough , dullness-none, rub- none           Chest wall-  Abd-  Br/ Gen/ Rectal- Not done, not indicated Extrem- cyanosis- none, clubbing, none, atrophy- none, strength- nl Neuro- grossly intact to observation

## 2011-07-02 NOTE — Patient Instructions (Addendum)
Sample Tudorza inhaler- 1 puff twice daily- see if it helps your shortness of breath any.  Refill script Ipratropium nasal spray - sent  Continue allergy vaccine

## 2011-07-06 NOTE — Assessment & Plan Note (Signed)
We discussed contribution of chronic atrial fibrillation to her shortness of breath.

## 2011-07-06 NOTE — Assessment & Plan Note (Signed)
She continues to believe that allergy vaccine maintains her symptom control. We discussed use of ipratropium nasal spray.

## 2011-07-10 ENCOUNTER — Ambulatory Visit (INDEPENDENT_AMBULATORY_CARE_PROVIDER_SITE_OTHER): Payer: Medicare Other

## 2011-07-10 DIAGNOSIS — J309 Allergic rhinitis, unspecified: Secondary | ICD-10-CM

## 2011-07-18 ENCOUNTER — Ambulatory Visit (INDEPENDENT_AMBULATORY_CARE_PROVIDER_SITE_OTHER): Payer: Medicare Other

## 2011-07-18 DIAGNOSIS — J309 Allergic rhinitis, unspecified: Secondary | ICD-10-CM | POA: Diagnosis not present

## 2011-07-25 ENCOUNTER — Ambulatory Visit (INDEPENDENT_AMBULATORY_CARE_PROVIDER_SITE_OTHER): Payer: Medicare Other

## 2011-07-25 DIAGNOSIS — J309 Allergic rhinitis, unspecified: Secondary | ICD-10-CM | POA: Diagnosis not present

## 2011-07-31 ENCOUNTER — Ambulatory Visit (INDEPENDENT_AMBULATORY_CARE_PROVIDER_SITE_OTHER): Payer: Medicare Other

## 2011-07-31 DIAGNOSIS — J309 Allergic rhinitis, unspecified: Secondary | ICD-10-CM | POA: Diagnosis not present

## 2011-08-06 ENCOUNTER — Ambulatory Visit (INDEPENDENT_AMBULATORY_CARE_PROVIDER_SITE_OTHER): Payer: Medicare Other

## 2011-08-06 DIAGNOSIS — J309 Allergic rhinitis, unspecified: Secondary | ICD-10-CM

## 2011-08-13 ENCOUNTER — Ambulatory Visit (INDEPENDENT_AMBULATORY_CARE_PROVIDER_SITE_OTHER): Payer: Medicare Other

## 2011-08-13 DIAGNOSIS — J309 Allergic rhinitis, unspecified: Secondary | ICD-10-CM | POA: Diagnosis not present

## 2011-08-14 DIAGNOSIS — H209 Unspecified iridocyclitis: Secondary | ICD-10-CM | POA: Diagnosis not present

## 2011-08-14 DIAGNOSIS — H168 Other keratitis: Secondary | ICD-10-CM | POA: Diagnosis not present

## 2011-08-14 DIAGNOSIS — C6930 Malignant neoplasm of unspecified choroid: Secondary | ICD-10-CM | POA: Diagnosis not present

## 2011-08-14 DIAGNOSIS — H02839 Dermatochalasis of unspecified eye, unspecified eyelid: Secondary | ICD-10-CM | POA: Diagnosis not present

## 2011-08-14 DIAGNOSIS — H04129 Dry eye syndrome of unspecified lacrimal gland: Secondary | ICD-10-CM | POA: Diagnosis not present

## 2011-08-14 DIAGNOSIS — H179 Unspecified corneal scar and opacity: Secondary | ICD-10-CM | POA: Diagnosis not present

## 2011-08-21 ENCOUNTER — Ambulatory Visit (INDEPENDENT_AMBULATORY_CARE_PROVIDER_SITE_OTHER): Payer: Medicare Other

## 2011-08-21 DIAGNOSIS — J309 Allergic rhinitis, unspecified: Secondary | ICD-10-CM

## 2011-08-28 ENCOUNTER — Ambulatory Visit (INDEPENDENT_AMBULATORY_CARE_PROVIDER_SITE_OTHER): Payer: Medicare Other

## 2011-08-28 DIAGNOSIS — J309 Allergic rhinitis, unspecified: Secondary | ICD-10-CM

## 2011-08-29 ENCOUNTER — Ambulatory Visit (INDEPENDENT_AMBULATORY_CARE_PROVIDER_SITE_OTHER): Payer: Medicare Other

## 2011-08-29 DIAGNOSIS — J309 Allergic rhinitis, unspecified: Secondary | ICD-10-CM

## 2011-08-30 DIAGNOSIS — I4891 Unspecified atrial fibrillation: Secondary | ICD-10-CM | POA: Diagnosis not present

## 2011-08-30 DIAGNOSIS — E782 Mixed hyperlipidemia: Secondary | ICD-10-CM | POA: Diagnosis not present

## 2011-08-30 DIAGNOSIS — I10 Essential (primary) hypertension: Secondary | ICD-10-CM | POA: Diagnosis not present

## 2011-09-03 ENCOUNTER — Inpatient Hospital Stay (HOSPITAL_COMMUNITY)
Admission: EM | Admit: 2011-09-03 | Discharge: 2011-09-05 | DRG: 392 | Disposition: A | Discharge status: Home / Self Care | Payer: Medicare Other | Attending: Cardiovascular Disease | Admitting: Cardiovascular Disease

## 2011-09-03 ENCOUNTER — Emergency Department (HOSPITAL_COMMUNITY): Payer: Medicare Other

## 2011-09-03 ENCOUNTER — Encounter (HOSPITAL_COMMUNITY): Payer: Self-pay | Admitting: Neurology

## 2011-09-03 DIAGNOSIS — J449 Chronic obstructive pulmonary disease, unspecified: Secondary | ICD-10-CM | POA: Diagnosis not present

## 2011-09-03 DIAGNOSIS — I4510 Unspecified right bundle-branch block: Secondary | ICD-10-CM | POA: Diagnosis present

## 2011-09-03 DIAGNOSIS — I452 Bifascicular block: Secondary | ICD-10-CM | POA: Diagnosis present

## 2011-09-03 DIAGNOSIS — K219 Gastro-esophageal reflux disease without esophagitis: Secondary | ICD-10-CM | POA: Diagnosis present

## 2011-09-03 DIAGNOSIS — I4819 Other persistent atrial fibrillation: Secondary | ICD-10-CM | POA: Diagnosis present

## 2011-09-03 DIAGNOSIS — I2510 Atherosclerotic heart disease of native coronary artery without angina pectoris: Secondary | ICD-10-CM | POA: Diagnosis present

## 2011-09-03 DIAGNOSIS — R55 Syncope and collapse: Secondary | ICD-10-CM | POA: Diagnosis not present

## 2011-09-03 DIAGNOSIS — I200 Unstable angina: Secondary | ICD-10-CM | POA: Diagnosis not present

## 2011-09-03 DIAGNOSIS — R079 Chest pain, unspecified: Secondary | ICD-10-CM | POA: Diagnosis not present

## 2011-09-03 DIAGNOSIS — J309 Allergic rhinitis, unspecified: Secondary | ICD-10-CM | POA: Diagnosis not present

## 2011-09-03 DIAGNOSIS — I4891 Unspecified atrial fibrillation: Secondary | ICD-10-CM | POA: Diagnosis not present

## 2011-09-03 DIAGNOSIS — R0609 Other forms of dyspnea: Secondary | ICD-10-CM | POA: Diagnosis present

## 2011-09-03 DIAGNOSIS — I10 Essential (primary) hypertension: Secondary | ICD-10-CM | POA: Diagnosis present

## 2011-09-03 DIAGNOSIS — R404 Transient alteration of awareness: Secondary | ICD-10-CM | POA: Diagnosis not present

## 2011-09-03 DIAGNOSIS — J441 Chronic obstructive pulmonary disease with (acute) exacerbation: Secondary | ICD-10-CM | POA: Diagnosis present

## 2011-09-03 DIAGNOSIS — R0600 Dyspnea, unspecified: Secondary | ICD-10-CM | POA: Diagnosis present

## 2011-09-03 DIAGNOSIS — R0602 Shortness of breath: Secondary | ICD-10-CM | POA: Diagnosis not present

## 2011-09-03 DIAGNOSIS — J4489 Other specified chronic obstructive pulmonary disease: Secondary | ICD-10-CM | POA: Diagnosis present

## 2011-09-03 LAB — CBC
HCT: 41.8 % (ref 36.0–46.0)
MCH: 32.4 pg (ref 26.0–34.0)
MCHC: 34.4 g/dL (ref 30.0–36.0)
RDW: 13.7 % (ref 11.5–15.5)

## 2011-09-03 LAB — DIFFERENTIAL
Basophils Absolute: 0 10*3/uL (ref 0.0–0.1)
Basophils Relative: 0 % (ref 0–1)
Eosinophils Absolute: 0.1 10*3/uL (ref 0.0–0.7)
Monocytes Absolute: 0.8 10*3/uL (ref 0.1–1.0)
Neutro Abs: 6.6 10*3/uL (ref 1.7–7.7)
Neutrophils Relative %: 71 % (ref 43–77)

## 2011-09-03 LAB — COMPREHENSIVE METABOLIC PANEL
AST: 21 U/L (ref 0–37)
Albumin: 3.3 g/dL — ABNORMAL LOW (ref 3.5–5.2)
BUN: 18 mg/dL (ref 6–23)
Chloride: 105 mEq/L (ref 96–112)
Creatinine, Ser: 0.98 mg/dL (ref 0.50–1.10)
Total Bilirubin: 0.7 mg/dL (ref 0.3–1.2)
Total Protein: 6 g/dL (ref 6.0–8.3)

## 2011-09-03 LAB — POCT I-STAT TROPONIN I: Troponin i, poc: 0 ng/mL (ref 0.00–0.08)

## 2011-09-03 MED ORDER — ZOLPIDEM TARTRATE 5 MG PO TABS: 5.0000 mg | ORAL_TABLET | Freq: Every evening | ORAL | Status: DC | PRN

## 2011-09-03 MED ORDER — ADULT MULTIVITAMIN W/MINERALS CH
1.0000 | ORAL_TABLET | Freq: Every day | ORAL | Status: DC
  Administered 2011-09-04: 1 via ORAL
  Filled 2011-09-03: qty 1

## 2011-09-03 MED ORDER — ATORVASTATIN CALCIUM 10 MG PO TABS
10.0000 mg | ORAL_TABLET | Freq: Every day | ORAL | Status: DC
  Filled 2011-09-03: qty 1

## 2011-09-03 MED ORDER — ACETAMINOPHEN 325 MG PO TABS: 650.0000 mg | ORAL_TABLET | ORAL | Status: DC | PRN

## 2011-09-03 MED ORDER — METOPROLOL TARTRATE 25 MG PO TABS
25.0000 mg | ORAL_TABLET | Freq: Every day | ORAL | Status: DC
  Administered 2011-09-04 – 2011-09-05 (×2): 25 mg via ORAL
  Filled 2011-09-03 (×2): qty 1

## 2011-09-03 MED ORDER — CALCIUM CARBONATE 1250 (500 CA) MG PO TABS
1.0000 | ORAL_TABLET | Freq: Two times a day (BID) | ORAL | Status: DC
  Administered 2011-09-03 – 2011-09-04 (×2): 500 mg via ORAL
  Filled 2011-09-03 (×3): qty 1

## 2011-09-03 MED ORDER — SODIUM CHLORIDE 0.9 % IJ SOLN: 3.0000 mL | INTRAMUSCULAR | Status: DC | PRN

## 2011-09-03 MED ORDER — OMEGA-3-ACID ETHYL ESTERS 1 G PO CAPS
1.0000 g | ORAL_CAPSULE | Freq: Two times a day (BID) | ORAL | Status: DC
  Administered 2011-09-03 – 2011-09-05 (×3): 1 g via ORAL
  Filled 2011-09-03 (×5): qty 1

## 2011-09-03 MED ORDER — NITROGLYCERIN 0.4 MG SL SUBL: 0.4000 mg | SUBLINGUAL_TABLET | SUBLINGUAL | Status: DC | PRN

## 2011-09-03 MED ORDER — LOTEPREDNOL ETABONATE 0.5 % OP SUSP
1.0000 [drp] | Freq: Two times a day (BID) | OPHTHALMIC | Status: DC
  Administered 2011-09-03 – 2011-09-05 (×4): 1 [drp] via OPHTHALMIC
  Filled 2011-09-03 (×2): qty 5

## 2011-09-03 MED ORDER — THERA M PLUS PO TABS: 1.0000 | ORAL_TABLET | ORAL | Status: DC

## 2011-09-03 MED ORDER — FUROSEMIDE 20 MG PO TABS
20.0000 mg | ORAL_TABLET | Freq: Every day | ORAL | Status: DC
  Administered 2011-09-04: 20 mg via ORAL
  Filled 2011-09-03 (×2): qty 1

## 2011-09-03 MED ORDER — IPRATROPIUM BROMIDE 0.06 % NA SOLN
2.0000 | Freq: Every day | NASAL | Status: DC | PRN
  Filled 2011-09-03: qty 15

## 2011-09-03 MED ORDER — SODIUM CHLORIDE 0.9 % IV SOLN: 250.0000 mL | INTRAVENOUS | Status: DC | PRN

## 2011-09-03 MED ORDER — ONDANSETRON HCL 4 MG/2ML IJ SOLN
4.0000 mg | Freq: Four times a day (QID) | INTRAMUSCULAR | Status: DC | PRN
  Administered 2011-09-04: 4 mg via INTRAVENOUS
  Filled 2011-09-03: qty 2

## 2011-09-03 MED ORDER — ALPRAZOLAM 0.25 MG PO TABS: 0.2500 mg | ORAL_TABLET | Freq: Three times a day (TID) | ORAL | Status: DC | PRN

## 2011-09-03 MED ORDER — ASPIRIN 81 MG PO CHEW
324.0000 mg | CHEWABLE_TABLET | ORAL | Status: AC
  Administered 2011-09-04: 324 mg via ORAL
  Filled 2011-09-03: qty 4

## 2011-09-03 MED ORDER — SODIUM CHLORIDE 0.9 % IV SOLN
INTRAVENOUS | Status: DC
  Administered 2011-09-03 – 2011-09-04 (×2): via INTRAVENOUS

## 2011-09-03 MED ORDER — PANTOPRAZOLE SODIUM 40 MG PO TBEC
40.0000 mg | DELAYED_RELEASE_TABLET | Freq: Every day | ORAL | Status: DC
  Administered 2011-09-05: 40 mg via ORAL
  Filled 2011-09-03: qty 1

## 2011-09-03 MED ORDER — DILTIAZEM HCL ER COATED BEADS 120 MG PO CP24
120.0000 mg | ORAL_CAPSULE | Freq: Every day | ORAL | Status: DC
  Administered 2011-09-04 – 2011-09-05 (×2): 120 mg via ORAL
  Filled 2011-09-03 (×2): qty 1

## 2011-09-03 MED ORDER — LORATADINE 10 MG PO TABS
10.0000 mg | ORAL_TABLET | Freq: Every day | ORAL | Status: DC | PRN
  Administered 2011-09-05: 10 mg via ORAL
  Filled 2011-09-03: qty 1

## 2011-09-03 MED ORDER — DIAZEPAM 5 MG PO TABS
5.0000 mg | ORAL_TABLET | ORAL | Status: AC
  Administered 2011-09-04: 5 mg via ORAL
  Filled 2011-09-03: qty 1

## 2011-09-03 NOTE — ED Notes (Signed)
Dr. Allyson Sabal to be down here in 10 mins

## 2011-09-03 NOTE — ED Notes (Signed)
Cardiology down here seeing patient reporting pt will be admitted.

## 2011-09-03 NOTE — ED Notes (Signed)
Patient transported to X-ray 

## 2011-09-03 NOTE — ED Notes (Signed)
Attempt to move pt to CDU to wait for bed, CDU full at this time
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
## 2011-09-03 NOTE — ED Notes (Addendum)
Per ems- Pt comes from home, pt had CP this morning pain 10/10, pt reporting took bp and it was low, continued to take 1 SL nitro, following taking nitro pt had syncopal episode, along with 1 episode of vomiting and bowel incontinence. Pt was assisted to the floor by husband, when EMS arrived pt conscious, following commands but lying on the floor. At this time pt denying any cp. BP 88/40, HR 60-80 AFIB, pt reporting in AFIB since November. A & o x 4. EMS gave pt 500 cc bolus brought BP to 100 systolic.

## 2011-09-03 NOTE — ED Notes (Signed)
Ordered dinner tray [facility] 

## 2011-09-03 NOTE — ED Notes (Signed)
EKG performed by Reuel Boom, NT- given to Dr. Radford Pax.

## 2011-09-04 ENCOUNTER — Encounter (HOSPITAL_COMMUNITY)
Admission: EM | Disposition: A | Discharge status: Home / Self Care | Payer: Self-pay | Source: Home / Self Care | Attending: Cardiovascular Disease

## 2011-09-04 HISTORY — PX: LEFT HEART CATHETERIZATION WITH CORONARY ANGIOGRAM: SHX5451

## 2011-09-04 LAB — BASIC METABOLIC PANEL
BUN: 18 mg/dL (ref 6–23)
CO2: 23 mEq/L (ref 19–32)
Calcium: 9 mg/dL (ref 8.4–10.5)
Chloride: 109 mEq/L (ref 96–112)
Creatinine, Ser: 0.9 mg/dL (ref 0.50–1.10)
GFR calc Af Amer: 67 mL/min — ABNORMAL LOW (ref >90)
GFR calc non Af Amer: 58 mL/min — ABNORMAL LOW (ref >90)
Glucose, Bld: 93 mg/dL (ref 70–99)
Potassium: 4.1 mEq/L (ref 3.5–5.1)
Sodium: 141 mEq/L (ref 135–145)

## 2011-09-04 LAB — CBC
HCT: 42.8 % (ref 36.0–46.0)
Hemoglobin: 14.5 g/dL (ref 12.0–15.0)
MCH: 32.2 pg (ref 26.0–34.0)
MCHC: 33.9 g/dL (ref 30.0–36.0)
MCV: 95.1 fL (ref 78.0–100.0)
Platelets: 153 10*3/uL (ref 150–400)
RBC: 4.5 MIL/uL (ref 3.87–5.11)
RDW: 13.8 % (ref 11.5–15.5)
WBC: 14.6 10*3/uL — ABNORMAL HIGH (ref 4.0–10.5)

## 2011-09-04 LAB — POCT ACTIVATED CLOTTING TIME: Activated Clotting Time: 111 seconds

## 2011-09-04 LAB — PROTIME-INR
INR: 1.18 (ref 0.00–1.49)
Prothrombin Time: 15.3 seconds — ABNORMAL HIGH (ref 11.6–15.2)

## 2011-09-04 LAB — TROPONIN I: Troponin I: <0.3 ng/mL (ref <0.30)

## 2011-09-04 LAB — D-DIMER, QUANTITATIVE: D-Dimer, Quant: 0.33 ug/mL-FEU (ref 0.00–0.48)

## 2011-09-04 LAB — PRO B NATRIURETIC PEPTIDE: Pro B Natriuretic peptide (BNP): 1553 pg/mL — ABNORMAL HIGH (ref 0–450)

## 2011-09-04 SURGERY — LEFT HEART CATHETERIZATION WITH CORONARY ANGIOGRAM
Anesthesia: LOCAL

## 2011-09-04 MED ORDER — LIDOCAINE HCL (PF) 1 % IJ SOLN
INTRAMUSCULAR | Status: AC
  Filled 2011-09-04: qty 30

## 2011-09-04 MED ORDER — NITROGLYCERIN 0.2 MG/ML ON CALL CATH LAB
INTRAVENOUS | Status: AC
  Filled 2011-09-04: qty 1

## 2011-09-04 MED ORDER — SODIUM CHLORIDE 0.9 % IV SOLN: INTRAVENOUS | Status: AC

## 2011-09-04 MED ORDER — DABIGATRAN ETEXILATE MESYLATE 150 MG PO CAPS
150.0000 mg | ORAL_CAPSULE | Freq: Two times a day (BID) | ORAL | Status: DC
  Administered 2011-09-05: 150 mg via ORAL
  Filled 2011-09-04 (×2): qty 1

## 2011-09-04 MED ORDER — MORPHINE SULFATE 2 MG/ML IJ SOLN: 1.0000 mg | INTRAMUSCULAR | Status: DC | PRN

## 2011-09-04 MED ORDER — HEPARIN (PORCINE) IN NACL 2-0.9 UNIT/ML-% IJ SOLN
INTRAMUSCULAR | Status: AC
  Filled 2011-09-04: qty 2000

## 2011-09-04 MED ORDER — ONDANSETRON HCL 4 MG/2ML IJ SOLN: 4.0000 mg | Freq: Four times a day (QID) | INTRAMUSCULAR | Status: DC | PRN

## 2011-09-04 MED ORDER — ACETAMINOPHEN 325 MG PO TABS: 650.0000 mg | ORAL_TABLET | ORAL | Status: DC | PRN

## 2011-09-04 MED ORDER — ROSUVASTATIN CALCIUM 5 MG PO TABS
5.0000 mg | ORAL_TABLET | Freq: Every day | ORAL | Status: DC
  Administered 2011-09-04: 5 mg via ORAL
  Filled 2011-09-04 (×2): qty 1

## 2011-09-04 NOTE — H&P (Signed)
   Pt was reexamined and existing H & P reviewed. No changes found.  Runell Gess, MD [facility] 09/04/2011 4:40 PM

## 2011-09-04 NOTE — Progress Notes (Signed)
Subjective:  Vague sensation of chest "fullness" and SOB.  Objective:  Vital Signs in the last 24 hours: Temp:  [97.6 F (36.4 C)-98.1 F (36.7 C)] 97.8 F (36.6 C) (04/24 0621) Pulse Rate:  [70-82] 80  (04/24 0621) Resp:  [16-18] 18  (04/24 0621) BP: (111-144)/(70-89) 111/74 mmHg (04/24 0621) SpO2:  [96 %-100 %] 96 % (04/24 0621) Weight:  [73.619 kg (162 lb 4.8 oz)] 73.619 kg (162 lb 4.8 oz) (04/23 2122)  Intake/Output from previous day: No intake or output data in the 24 hours ending 09/04/11 0820  Physical Exam: General appearance: alert, cooperative and no distress Lungs: clear to auscultation bilaterally Heart: irregularly irregular rhythm   Rate: 80  Rhythm: atrial fibrillation  Lab Results:  Basename 09/03/11 1122  WBC 9.2  HGB 14.4  PLT 171    Basename 09/03/11 1122  NA 139  K 3.9  CL 105  CO2 23  GLUCOSE 105*  BUN 18  CREATININE 0.98   No results found for this basename: TROPONINI:2,CK,MB:2 in the last 72 hours Hepatic Function Panel  Basename 09/03/11 1122  PROT 6.0  ALBUMIN 3.3*  AST 21  ALT 15  ALKPHOS 76  BILITOT 0.7  BILIDIR --  IBILI --   No results found for this basename: CHOL in the last 72 hours No results found for this basename: INR in the last 72 hours  Imaging: Imaging results have been reviewed CTA of chest from 2011 showed-IMPRESSION:  1. No evidence of aortic dissection or acute process in the chest.  2. Cardiomegaly and coronary artery atherosclerosis.  3. Pulmonary artery enlargement suggests pulmonary arterial  hypertension.  4. 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  Consider non emergent thoracic spine pre and post contrast MRI.  Similar, less impressive findings identified within the lumbar  spine.  5. Small hiatal hernia    Cardiac Studies:  Assessment/Plan:   Principal Problem:  *Chest pain Active Problems:  Persistent atrial fibrillation, failed DCCV 11/12, turned down for RFA  Chronic anticoagulation, Pradaxa  GERD  Right bundle branch block and left anterior fascicular block  HTN (hypertension)  Normal coronary angiogram 2001, low risk Myoview 7/12  Dyspnea on exertion, secondary to AF  Plan- cath today. Check BNP and d-dimer. 2D done 11/12 showed no pulm HTN.    Corine Shelter PA-C 09/04/2011, 8:20 AM

## 2011-09-04 NOTE — Op Note (Signed)
[name] is a 78 y.o. female    [id number] LOCATION:  FACILITY: [facility]  PHYSICIAN: Nanetta Batty, M.D. 12-24-1925   DATE OF PROCEDURE:  09/04/2011  DATE OF DISCHARGE:  [facility]  CARDIAC CATHETERIZATION     History obtained from chart review.  This is a 78 y.o. female with a past medical history significant for persistent atrial fibrillation. She failed DCCV Nov 2012 and was turned down for RFA after EP eval (Dr Tillie Rung). She has chronic DOE secondary to her AF. Today she sitting watching TV when she had localized Lt chest pain which is hard for her to describe. She does put her finger on her chest to indicate the spot. She took a NTG then had syncope. She was brouight to the ER by her husband. She is pain free now. Echo in the past has shown NL LVF. Prior cath in 2001 NL Cs. Myoview July 2012 low risk. She does say the pain radiated to her Lt jaw. She denies any SOB, diaphoresis but she was nauseated after her syncopal spell.   PROCEDURE DESCRIPTION:    The patient was brought to the second floor  [facility] cath lab in the postabsorptive state. She was  premedicated with Valium 5 mg by mouth. Her right groin was prepped and shaved in usual sterile fashion. Xylocaine 1% was used  for local anesthesia. A 5 French sheath was inserted into the right common femoral  artery using standard Seldinger technique. 5 French right and left Judkins diagnostic catheters along with a 5 French pigtail catheter were used for selective coronary angiography, and left ventriculography respectively. Visipaque dye was used for the entirety of the case. Retroperiaortic, left ventricular and pulmonary pressures were recorded. A total of 70 cc of contrast was administered to the patient  HEMODYNAMICS:    AO SYSTOLIC/AO DIASTOLIC: 127/72   LV SYSTOLIC/LV DIASTOLIC: 116/9  ANGIOGRAPHIC RESULTS:   1. Left main; normal  2. LAD; normal 3. Left circumflex; the  circumflex gave off a high first marginal branch which had a 40-50% segmental proximal to mid stenosis.Marland Kitchen  4. Right coronary artery; dominant with a 30-40% eccentric proximal stenosis 5. Left ventriculography; RAO left ventriculogram was performed using  25 mL of Visipaque dye at 12 mL/second. The overall LVEF estimated  55-60 % Without wall motion abnormalities. There was a mild inferoapical small aneurysmal segment was hypokinetic.Marland Kitchen  IMPRESSION:[name] has essentially minimal CAD probably not responsible for her symptoms with preserved LV function. She'll continue to have medical therapy. An ACT was measured and the sheath was removed. Pressure was held on the groin to achieve hemostasis. We will restarPradaxa in the morning of discharge her home at that time.  Runell Gess MD, [facility] 09/04/2011 5:15 PM

## 2011-09-05 ENCOUNTER — Ambulatory Visit (INDEPENDENT_AMBULATORY_CARE_PROVIDER_SITE_OTHER): Payer: Medicare Other

## 2011-09-05 DIAGNOSIS — I2510 Atherosclerotic heart disease of native coronary artery without angina pectoris: Secondary | ICD-10-CM | POA: Diagnosis present

## 2011-09-05 DIAGNOSIS — R55 Syncope and collapse: Secondary | ICD-10-CM | POA: Diagnosis present

## 2011-09-05 DIAGNOSIS — J449 Chronic obstructive pulmonary disease, unspecified: Secondary | ICD-10-CM | POA: Diagnosis present

## 2011-09-05 DIAGNOSIS — J309 Allergic rhinitis, unspecified: Secondary | ICD-10-CM

## 2011-09-05 DIAGNOSIS — J441 Chronic obstructive pulmonary disease with (acute) exacerbation: Secondary | ICD-10-CM | POA: Diagnosis present

## 2011-09-05 MED ORDER — FUROSEMIDE 20 MG PO TABS: 20.0000 mg | ORAL_TABLET | Freq: Every day | ORAL | Status: DC | PRN

## 2011-09-05 MED ORDER — FUROSEMIDE 40 MG PO TABS
40.0000 mg | ORAL_TABLET | Freq: Every day | ORAL | Status: DC
  Filled 2011-09-05: qty 1

## 2011-09-05 MED ORDER — PANTOPRAZOLE SODIUM 40 MG PO TBEC: 40.0000 mg | DELAYED_RELEASE_TABLET | Freq: Every day | ORAL | Status: DC

## 2011-09-05 MED ORDER — ACETAMINOPHEN 325 MG PO TABS: 650.0000 mg | ORAL_TABLET | ORAL | Status: DC | PRN

## 2011-09-05 NOTE — Progress Notes (Signed)
Pt. Seen and examined. Agree with the NP/PA-C note as written.  Feels much better today. Sounds like she synopsized due to hypotension and low preload + diuretics. BNP elevated but does not appear clinically volume overloaded. She has a mildly elevated WBC count. No fever. Had some diarrhea and nausea yesterday. She is okay for discharge today. Will change diuretics to prn for leg swelling. Follow-up outpatient CBC with diff and CMP/BNP on Monday.    Chrystie Nose, MD, [facility] Attending Cardiologist [facility]

## 2011-09-05 NOTE — Discharge Summary (Signed)
Patient ID: [name],  MRN: [id number], DOB/AGE: 01/14/1926 78 y.o.  Admit date: 09/03/2011 Discharge date: 09/05/2011  Primary Care Provider: Dr Nicholos Johns Primary Cardiologist: Dr Allyson Sabal  Discharge Diagnoses:  Principal Problem:  *Chest pain  Active Problems:  Syncope on admission after NTG  Persistent atrial fibrillation, failed DCCV 11/12, turned down for RFA  GERD  Chronic anticoagulation, Pradaxa  Right bundle branch block and left anterior fascicular block  HTN (hypertension)  Dyspnea on exertion, secondary to AF  CAD, mild by cath 2001 and 09/04/11  COPD, followed by Dr Maple Hudson    Procedures: Coronary angiogram 09/04/11   Hospital Course: This is a 78 y.o. female with a past medical history significant for persistent atrial fibrillation. She failed DCCV Nov 2012 and was turned down for RFA after EP eval (Dr Tillie Rung). She has chronic DOE secondary to her AF and COPD. Today she was sitting watching TV when she had localized Lt chest pain which is hard for her to describe. She does put her finger on her chest to indicate the spot. She took a NTG then had syncope. She was brouight to the ER by her husband. She is pain free now She says before she took the NTG her B/P was low. Echo in the past has shown NL LVF. Prior cath in 2001 NL Cs. Myoview July 2012 low risk. She does say the pain radiated to her Lt jaw. She denies any SOB, diaphoresis but she was nauseated after her syncopal spell. She was admitted to telemetry and set up for cath. Her Pradaxa and Cozaar 100mg  were held. She had a diagnostic cath done 09/04/11 by Dr Allyson Sabal. This revealed mild CAD. He does not feel this caused her admission chest pain. She feels better 4/25. In retrospect she thinks her symptoms were secondary to GERD, which she says was classified as "severe" in the past by Dr [name]. She takes Prilosec at home and we changed that to Protonix at discharge. Her BNP was elevated at 1500 but she had no signs of CHF and  Dr Rennis Golden cut her Lasix back to prn for LE edema. Her WBC was also elevated though no fever or signs of infection noted. She'll have follow up labs as an OP. We kept her off Cozaar and instructed her not to take NTG.   Discharge Vitals:  Blood pressure 138/86, pulse 95, temperature 98.2 F (36.8 C), temperature source Oral, resp. rate 16, height 5\' 2"  (1.575 m), weight 74.4 kg (164 lb 0.4 oz), SpO2 95.00%.    Labs: Results for orders placed during the hospital encounter of 09/03/11 (from the past 48 hour(s))  CBC     Status: Normal   Collection Time   09/03/11 11:22 AM      Component Value Range Comment   WBC 9.2  4.0 - 10.5 (K/uL)    RBC 4.45  3.87 - 5.11 (MIL/uL)    Hemoglobin 14.4  12.0 - 15.0 (g/dL)    HCT 78.2  95.6 - 21.3 (%)    MCV 93.9  78.0 - 100.0 (fL)    MCH 32.4  26.0 - 34.0 (pg)    MCHC 34.4  30.0 - 36.0 (g/dL)    RDW 08.6  57.8 - 46.9 (%)    Platelets 171  150 - 400 (K/uL)   DIFFERENTIAL     Status: Normal   Collection Time   09/03/11 11:22 AM      Component Value Range Comment   Neutrophils Relative 71  43 -  77 (%)    Neutro Abs 6.6  1.7 - 7.7 (K/uL)    Lymphocytes Relative 18  12 - 46 (%)    Lymphs Abs 1.7  0.7 - 4.0 (K/uL)    Monocytes Relative 9  3 - 12 (%)    Monocytes Absolute 0.8  0.1 - 1.0 (K/uL)    Eosinophils Relative 1  0 - 5 (%)    Eosinophils Absolute 0.1  0.0 - 0.7 (K/uL)    Basophils Relative 0  0 - 1 (%)    Basophils Absolute 0.0  0.0 - 0.1 (K/uL)   COMPREHENSIVE METABOLIC PANEL     Status: Abnormal   Collection Time   09/03/11 11:22 AM      Component Value Range Comment   Sodium 139  135 - 145 (mEq/L)    Potassium 3.9  3.5 - 5.1 (mEq/L)    Chloride 105  96 - 112 (mEq/L)    CO2 23  19 - 32 (mEq/L)    Glucose, Bld 105 (*) 70 - 99 (mg/dL)    BUN 18  6 - 23 (mg/dL)    Creatinine, Ser 4.09  0.50 - 1.10 (mg/dL)    Calcium 8.7  8.4 - 10.5 (mg/dL)    Total Protein 6.0  6.0 - 8.3 (g/dL)    Albumin 3.3 (*) 3.5 - 5.2 (g/dL)    AST 21  0 - 37 (U/L)      ALT 15  0 - 35 (U/L)    Alkaline Phosphatase 76  39 - 117 (U/L)    Total Bilirubin 0.7  0.3 - 1.2 (mg/dL)    GFR calc non Af Amer 52 (*) >90 (mL/min)    GFR calc Af Amer 60 (*) >90 (mL/min)   POCT I-STAT TROPONIN I     Status: Normal   Collection Time   09/03/11 11:42 AM      Component Value Range Comment   Troponin i, poc 0.00  0.00 - 0.08 (ng/mL)    Comment 3            BASIC METABOLIC PANEL     Status: Abnormal   Collection Time   09/04/11  9:45 AM      Component Value Range Comment   Sodium 141  135 - 145 (mEq/L)    Potassium 4.1  3.5 - 5.1 (mEq/L)    Chloride 109  96 - 112 (mEq/L)    CO2 23  19 - 32 (mEq/L)    Glucose, Bld 93  70 - 99 (mg/dL)    BUN 18  6 - 23 (mg/dL)    Creatinine, Ser 8.11  0.50 - 1.10 (mg/dL)    Calcium 9.0  8.4 - 10.5 (mg/dL)    GFR calc non Af Amer 58 (*) >90 (mL/min)    GFR calc Af Amer 67 (*) >90 (mL/min)   CBC     Status: Abnormal   Collection Time   09/04/11  9:45 AM      Component Value Range Comment   WBC 14.6 (*) 4.0 - 10.5 (K/uL)    RBC 4.50  3.87 - 5.11 (MIL/uL)    Hemoglobin 14.5  12.0 - 15.0 (g/dL)    HCT 91.4  78.2 - 95.6 (%)    MCV 95.1  78.0 - 100.0 (fL)    MCH 32.2  26.0 - 34.0 (pg)    MCHC 33.9  30.0 - 36.0 (g/dL)    RDW 21.3  08.6 - 57.8 (%)    Platelets 153  150 - 400 (K/uL)   PROTIME-INR     Status: Abnormal   Collection Time   09/04/11  9:45 AM      Component Value Range Comment   Prothrombin Time 15.3 (*) 11.6 - 15.2 (seconds)    INR 1.18  0.00 - 1.49    D-DIMER, QUANTITATIVE     Status: Normal   Collection Time   09/04/11  9:45 AM      Component Value Range Comment   D-Dimer, Quant 0.33  0.00 - 0.48 (ug/mL-FEU)   PRO B NATRIURETIC PEPTIDE     Status: Abnormal   Collection Time   09/04/11  9:45 AM      Component Value Range Comment   Pro B Natriuretic peptide (BNP) 1553.0 (*) 0 - 450 (pg/mL)   TROPONIN I     Status: Normal   Collection Time   09/04/11  9:45 AM      Component Value Range Comment   Troponin I <0.30   <0.30 (ng/mL)   POCT ACTIVATED CLOTTING TIME     Status: Normal   Collection Time   09/04/11  5:07 PM      Component Value Range Comment   Activated Clotting Time 111       Disposition:  Follow-up Information    Follow up with Abelino Derrick, PA. (office will call)    Contact information:   [address] [place] [address] [phone]          Discharge Medications:  Medication List  As of 09/05/2011 10:23 AM   STOP taking these medications         losartan 100 MG tablet      nitroGLYCERIN 0.4 MG SL tablet      omeprazole 40 MG capsule         TAKE these medications         acetaminophen 325 MG tablet   Commonly known as: TYLENOL   Take 2 tablets (650 mg total) by mouth every 4 (four) hours as needed.      calcium carbonate 1250 MG tablet   Commonly known as: OS-CAL - dosed in mg of elemental calcium   Take 1 tablet by mouth 2 (two) times daily.      CARDIZEM CD 120 MG 24 hr capsule   Generic drug: diltiazem   Take 120 mg by mouth daily.      dabigatran 150 MG Caps   Commonly known as: PRADAXA   Take 150 mg by mouth every 12 (twelve) hours.      Fish Oil 1000 MG Caps   Take 2 capsules by mouth 2 (two) times daily.      furosemide 20 MG tablet   Commonly known as: LASIX   Take 1 tablet (20 mg total) by mouth daily as needed. If needed for edema      ipratropium 0.06 % nasal spray   Commonly known as: ATROVENT   Place 2 sprays into the nose daily as needed. For nasal congestion      loratadine 10 MG tablet   Commonly known as: CLARITIN   Take 10 mg by mouth daily as needed. For allergy symptoms      loteprednol 0.5 % ophthalmic suspension   Commonly known as: LOTEMAX   Place 1 drop into both eyes 2 (two) times daily.      metoprolol tartrate 25 MG tablet   Commonly known as: LOPRESSOR   Take 25 mg by mouth daily.  multivitamins ther. w/minerals Tabs   Take 1 tablet by mouth every morning.      pantoprazole 40 MG  tablet   Commonly known as: PROTONIX   Take 1 tablet (40 mg total) by mouth daily at 12 noon.      rosuvastatin 5 MG tablet   Commonly known as: CRESTOR   Take 5 mg by mouth at bedtime.      vitamin E 400 UNIT capsule   Take 800 Units by mouth daily.            Outstanding Labs/Studies  Duration of Discharge Encounter: Greater than 30 minutes including physician time.  Jolene Provost PA-C 09/05/2011 10:23 AM

## 2011-09-05 NOTE — Discharge Summary (Signed)
Markies Mowatt C. Sarya Linenberger, MD, FACC Attending Cardiologist [facility]  

## 2011-09-05 NOTE — Progress Notes (Signed)
Subjective:  No chest pain this am. In retrospect she notes trouble swallowing. She has a past history of "severe" gastritis. She takes Prilosec at home.  Objective:  Vital Signs in the last 24 hours: Temp:  [97.6 F (36.4 C)-98.4 F (36.9 C)] 98.2 F (36.8 C) (04/25 0822) Pulse Rate:  [73-95] 95  (04/25 0822) Resp:  [15-22] 16  (04/25 0822) BP: (106-138)/(53-92) 138/86 mmHg (04/25 0822) SpO2:  [91 %-95 %] 95 % (04/25 0822) Weight:  [74.4 kg (164 lb 0.4 oz)] 74.4 kg (164 lb 0.4 oz) (04/25 0732)  Intake/Output from previous day:  Intake/Output Summary (Last 24 hours) at 09/05/11 0842 Last data filed at 09/05/11 0557  Gross per 24 hour  Intake 2246.25 ml  Output   1050 ml  Net 1196.25 ml    Physical Exam: General appearance: alert, cooperative and no distress Lungs: scattered rhonchi Heart: irregularly irregular rhythm Rt groin without hematoma   Rate: 90  Rhythm: atrial fibrillation  Lab Results:  Basename 09/04/11 0945 09/03/11 1122  WBC 14.6* 9.2  HGB 14.5 14.4  PLT 153 171    Basename 09/04/11 0945 09/03/11 1122  NA 141 139  K 4.1 3.9  CL 109 105  CO2 23 23  GLUCOSE 93 105*  BUN 18 18  CREATININE 0.90 0.98    Basename 09/04/11 0945  TROPONINI <0.30   Hepatic Function Panel  Basename 09/03/11 1122  PROT 6.0  ALBUMIN 3.3*  AST 21  ALT 15  ALKPHOS 76  BILITOT 0.7  BILIDIR --  IBILI --   No results found for this basename: CHOL in the last 72 hours  Basename 09/04/11 0945  INR 1.18    Imaging: Imaging results have been reviewed  Cardiac Studies:  Assessment/Plan:   Principal Problem:  *Chest pain  Active Problems:  Persistent atrial fibrillation, failed DCCV 11/12, turned down for RFA  GERD  Chronic anticoagulation, Pradaxa  CHF (congestive heart failure), mild, BNP 1500  Right bundle branch block and left anterior fascicular block  HTN (hypertension)  Dyspnea on exertion, secondary to AF  CAD, mild by cath 2001 and 09/04/11  COPD by CXR  Plan- Chest pain may have been from GERD or possibly related to mild CHF from AF. Will consider increasing Lasix to 40mg  alternating with 20mg ,( low B/P) and change Prilosec to Nexium. I can see in follow up.    Corine Shelter PA-C 09/05/2011, 8:42 AM

## 2011-09-09 DIAGNOSIS — J060 Acute laryngopharyngitis: Secondary | ICD-10-CM | POA: Diagnosis not present

## 2011-09-09 DIAGNOSIS — I4891 Unspecified atrial fibrillation: Secondary | ICD-10-CM | POA: Diagnosis not present

## 2011-09-09 DIAGNOSIS — I10 Essential (primary) hypertension: Secondary | ICD-10-CM | POA: Diagnosis not present

## 2011-09-12 ENCOUNTER — Ambulatory Visit (INDEPENDENT_AMBULATORY_CARE_PROVIDER_SITE_OTHER): Payer: Medicare Other

## 2011-09-12 DIAGNOSIS — J309 Allergic rhinitis, unspecified: Secondary | ICD-10-CM

## 2011-09-17 ENCOUNTER — Ambulatory Visit (INDEPENDENT_AMBULATORY_CARE_PROVIDER_SITE_OTHER): Payer: Medicare Other

## 2011-09-17 ENCOUNTER — Other Ambulatory Visit: Payer: Self-pay | Admitting: Interventional Radiology

## 2011-09-17 DIAGNOSIS — R5381 Other malaise: Secondary | ICD-10-CM | POA: Diagnosis not present

## 2011-09-17 DIAGNOSIS — R0602 Shortness of breath: Secondary | ICD-10-CM | POA: Diagnosis not present

## 2011-09-17 DIAGNOSIS — D3002 Benign neoplasm of left kidney: Secondary | ICD-10-CM

## 2011-09-17 DIAGNOSIS — J309 Allergic rhinitis, unspecified: Secondary | ICD-10-CM

## 2011-09-17 DIAGNOSIS — Z79899 Other long term (current) drug therapy: Secondary | ICD-10-CM | POA: Diagnosis not present

## 2011-09-20 DIAGNOSIS — R0602 Shortness of breath: Secondary | ICD-10-CM | POA: Diagnosis not present

## 2011-09-20 DIAGNOSIS — J449 Chronic obstructive pulmonary disease, unspecified: Secondary | ICD-10-CM | POA: Diagnosis not present

## 2011-09-20 DIAGNOSIS — I4891 Unspecified atrial fibrillation: Secondary | ICD-10-CM | POA: Diagnosis not present

## 2011-09-20 DIAGNOSIS — I10 Essential (primary) hypertension: Secondary | ICD-10-CM | POA: Diagnosis not present

## 2011-09-26 ENCOUNTER — Ambulatory Visit (INDEPENDENT_AMBULATORY_CARE_PROVIDER_SITE_OTHER): Payer: Medicare Other

## 2011-09-26 DIAGNOSIS — J309 Allergic rhinitis, unspecified: Secondary | ICD-10-CM

## 2011-09-30 DIAGNOSIS — I10 Essential (primary) hypertension: Secondary | ICD-10-CM | POA: Diagnosis not present

## 2011-09-30 DIAGNOSIS — I701 Atherosclerosis of renal artery: Secondary | ICD-10-CM | POA: Diagnosis not present

## 2011-10-01 ENCOUNTER — Ambulatory Visit (INDEPENDENT_AMBULATORY_CARE_PROVIDER_SITE_OTHER): Payer: Medicare Other

## 2011-10-01 DIAGNOSIS — I10 Essential (primary) hypertension: Secondary | ICD-10-CM | POA: Diagnosis not present

## 2011-10-01 DIAGNOSIS — E782 Mixed hyperlipidemia: Secondary | ICD-10-CM | POA: Diagnosis not present

## 2011-10-01 DIAGNOSIS — R748 Abnormal levels of other serum enzymes: Secondary | ICD-10-CM | POA: Diagnosis not present

## 2011-10-01 DIAGNOSIS — J309 Allergic rhinitis, unspecified: Secondary | ICD-10-CM

## 2011-10-01 DIAGNOSIS — R5381 Other malaise: Secondary | ICD-10-CM | POA: Diagnosis not present

## 2011-10-01 DIAGNOSIS — R5383 Other fatigue: Secondary | ICD-10-CM | POA: Diagnosis not present

## 2011-10-01 DIAGNOSIS — H04129 Dry eye syndrome of unspecified lacrimal gland: Secondary | ICD-10-CM | POA: Diagnosis not present

## 2011-10-01 DIAGNOSIS — H21519 Anterior synechiae (iris), unspecified eye: Secondary | ICD-10-CM | POA: Diagnosis not present

## 2011-10-08 ENCOUNTER — Ambulatory Visit (INDEPENDENT_AMBULATORY_CARE_PROVIDER_SITE_OTHER): Payer: Medicare Other

## 2011-10-08 DIAGNOSIS — J309 Allergic rhinitis, unspecified: Secondary | ICD-10-CM | POA: Diagnosis not present

## 2011-10-08 DIAGNOSIS — I10 Essential (primary) hypertension: Secondary | ICD-10-CM | POA: Diagnosis not present

## 2011-10-08 DIAGNOSIS — I4891 Unspecified atrial fibrillation: Secondary | ICD-10-CM | POA: Diagnosis not present

## 2011-10-08 DIAGNOSIS — M159 Polyosteoarthritis, unspecified: Secondary | ICD-10-CM | POA: Diagnosis not present

## 2011-10-08 DIAGNOSIS — E782 Mixed hyperlipidemia: Secondary | ICD-10-CM | POA: Diagnosis not present

## 2011-10-11 DIAGNOSIS — I4510 Unspecified right bundle-branch block: Secondary | ICD-10-CM | POA: Diagnosis not present

## 2011-10-11 DIAGNOSIS — I4891 Unspecified atrial fibrillation: Secondary | ICD-10-CM | POA: Diagnosis not present

## 2011-10-11 DIAGNOSIS — E785 Hyperlipidemia, unspecified: Secondary | ICD-10-CM | POA: Diagnosis not present

## 2011-10-11 DIAGNOSIS — Z79899 Other long term (current) drug therapy: Secondary | ICD-10-CM | POA: Diagnosis not present

## 2011-10-11 DIAGNOSIS — I499 Cardiac arrhythmia, unspecified: Secondary | ICD-10-CM | POA: Diagnosis not present

## 2011-10-11 DIAGNOSIS — I452 Bifascicular block: Secondary | ICD-10-CM | POA: Diagnosis not present

## 2011-10-16 ENCOUNTER — Ambulatory Visit (INDEPENDENT_AMBULATORY_CARE_PROVIDER_SITE_OTHER): Payer: Medicare Other

## 2011-10-16 DIAGNOSIS — J309 Allergic rhinitis, unspecified: Secondary | ICD-10-CM

## 2011-10-22 DIAGNOSIS — Z961 Presence of intraocular lens: Secondary | ICD-10-CM | POA: Diagnosis not present

## 2011-10-22 DIAGNOSIS — C6930 Malignant neoplasm of unspecified choroid: Secondary | ICD-10-CM | POA: Diagnosis not present

## 2011-10-24 ENCOUNTER — Ambulatory Visit (INDEPENDENT_AMBULATORY_CARE_PROVIDER_SITE_OTHER): Payer: Medicare Other

## 2011-10-24 DIAGNOSIS — J309 Allergic rhinitis, unspecified: Secondary | ICD-10-CM

## 2011-10-29 ENCOUNTER — Ambulatory Visit (HOSPITAL_COMMUNITY)
Admission: RE | Admit: 2011-10-29 | Discharge: 2011-10-29 | Disposition: A | Discharge status: Home / Self Care | Payer: Medicare Other | Source: Ambulatory Visit | Attending: Interventional Radiology | Admitting: Interventional Radiology

## 2011-10-29 ENCOUNTER — Ambulatory Visit (INDEPENDENT_AMBULATORY_CARE_PROVIDER_SITE_OTHER): Payer: Medicare Other

## 2011-10-29 ENCOUNTER — Ambulatory Visit
Admission: RE | Admit: 2011-10-29 | Discharge: 2011-10-29 | Disposition: A | Discharge status: Home / Self Care | Payer: Medicare Other | Source: Ambulatory Visit | Attending: Interventional Radiology | Admitting: Interventional Radiology

## 2011-10-29 DIAGNOSIS — K838 Other specified diseases of biliary tract: Secondary | ICD-10-CM | POA: Insufficient documentation

## 2011-10-29 DIAGNOSIS — Z9089 Acquired absence of other organs: Secondary | ICD-10-CM | POA: Insufficient documentation

## 2011-10-29 DIAGNOSIS — K5730 Diverticulosis of large intestine without perforation or abscess without bleeding: Secondary | ICD-10-CM | POA: Insufficient documentation

## 2011-10-29 DIAGNOSIS — Z5189 Encounter for other specified aftercare: Secondary | ICD-10-CM | POA: Diagnosis not present

## 2011-10-29 DIAGNOSIS — D3500 Benign neoplasm of unspecified adrenal gland: Secondary | ICD-10-CM | POA: Insufficient documentation

## 2011-10-29 DIAGNOSIS — J309 Allergic rhinitis, unspecified: Secondary | ICD-10-CM

## 2011-10-29 DIAGNOSIS — I714 Abdominal aortic aneurysm, without rupture, unspecified: Secondary | ICD-10-CM | POA: Insufficient documentation

## 2011-10-29 DIAGNOSIS — Z09 Encounter for follow-up examination after completed treatment for conditions other than malignant neoplasm: Secondary | ICD-10-CM | POA: Insufficient documentation

## 2011-10-29 DIAGNOSIS — D3002 Benign neoplasm of left kidney: Secondary | ICD-10-CM

## 2011-10-29 DIAGNOSIS — N289 Disorder of kidney and ureter, unspecified: Secondary | ICD-10-CM | POA: Diagnosis not present

## 2011-10-29 DIAGNOSIS — K449 Diaphragmatic hernia without obstruction or gangrene: Secondary | ICD-10-CM | POA: Insufficient documentation

## 2011-10-29 DIAGNOSIS — I700 Atherosclerosis of aorta: Secondary | ICD-10-CM | POA: Diagnosis not present

## 2011-10-29 DIAGNOSIS — D4959 Neoplasm of unspecified behavior of other genitourinary organ: Secondary | ICD-10-CM | POA: Diagnosis not present

## 2011-10-29 MED ORDER — IOHEXOL 300 MG/ML  SOLN
80.0000 mL | Freq: Once | INTRAMUSCULAR | Status: AC | PRN
  Administered 2011-10-29: 80 mL via INTRAVENOUS

## 2011-10-29 NOTE — Progress Notes (Signed)
Patient ID: [name], female   DOB: April 25, 1926, 78 y.o.   MRN: [id number]  ESTABLISHED PATIENT OFFICE VISIT  Chief Complaint: Status post percutaneous radiofrequency ablation of a left renal tumor on 09/23/2008.  History: [name] returns for follow-up. She has no complaints referable to prior left renal ablation. She has had recent workup and procedures for refractory atrial fibrillation since November, 2012. She is followed by Dr. Nanetta Batty. She has had cardioversion and is now on Pradaxa. She has been evaluated at [facility] and is scheduled for cardiac ablation in August.  Review of Systems: No fever or chills. No nausea, vomiting or diarrhea. No abdominal or flank pain. No hematuria or dysuria. Generalized fatigue and weakness which the patient attributes to atrial fibrillation.  Exam: Vital signs: Blood pressure 149/84, pulse 74, respirations 17, temperature 97.6, oxygen saturation 96% on room air. General: No acute distress. Abdomen: Soft and nontender. No flank tenderness.  Labs: BUN 24, creatinine 1.3 and estimated GFR 42 ml/minute on 10/01/2011  Imaging: I personally reviewed a CT of the abdomen performed today on 10/29/2011. This demonstrates stable post ablation changes involving the previously treated posterior left renal tumor with no evidence of tumor recurrence.  Assessment and Plan: The patient is doing well with respect to the previously treated left renal neoplasm with no evidence of tumor recurrence 3 years after radiofrequency ablation. It is highly unlikely that there will be any tumor recurrence at this point. However, I told [name] that if she is doing well next year, we would consider one more additional follow-up 4 years after treatment. We will contact her next year to see how she is doing.

## 2011-10-29 NOTE — Progress Notes (Signed)
Pt states that she has not had any problems associated w/ RFA.    Denies hematuria or other urinary problems.  Denies pain associated w/ RFA.  A fib since Nov 2012, care of Dr York Ram.  Pradaxa started.  Cardioversion x 1 in early Nov.  Converted to normal rhythm lasting 3 days, then A fib since.  Scheduled for cardiac ablation for Rx of A fib on 01/01/2012 @ [facility], [place], [place]

## 2011-11-06 ENCOUNTER — Ambulatory Visit (INDEPENDENT_AMBULATORY_CARE_PROVIDER_SITE_OTHER): Payer: Medicare Other

## 2011-11-06 DIAGNOSIS — J309 Allergic rhinitis, unspecified: Secondary | ICD-10-CM | POA: Diagnosis not present

## 2011-11-13 ENCOUNTER — Ambulatory Visit (INDEPENDENT_AMBULATORY_CARE_PROVIDER_SITE_OTHER): Payer: Medicare Other

## 2011-11-13 DIAGNOSIS — J309 Allergic rhinitis, unspecified: Secondary | ICD-10-CM

## 2011-11-20 ENCOUNTER — Ambulatory Visit (INDEPENDENT_AMBULATORY_CARE_PROVIDER_SITE_OTHER): Payer: Medicare Other

## 2011-11-20 DIAGNOSIS — J309 Allergic rhinitis, unspecified: Secondary | ICD-10-CM | POA: Diagnosis not present

## 2011-11-27 ENCOUNTER — Ambulatory Visit (INDEPENDENT_AMBULATORY_CARE_PROVIDER_SITE_OTHER): Payer: Medicare Other

## 2011-11-27 DIAGNOSIS — J309 Allergic rhinitis, unspecified: Secondary | ICD-10-CM | POA: Diagnosis not present

## 2011-12-03 ENCOUNTER — Ambulatory Visit (INDEPENDENT_AMBULATORY_CARE_PROVIDER_SITE_OTHER): Payer: Medicare Other

## 2011-12-03 DIAGNOSIS — J309 Allergic rhinitis, unspecified: Secondary | ICD-10-CM

## 2011-12-11 ENCOUNTER — Ambulatory Visit (INDEPENDENT_AMBULATORY_CARE_PROVIDER_SITE_OTHER): Payer: Medicare Other

## 2011-12-11 DIAGNOSIS — J309 Allergic rhinitis, unspecified: Secondary | ICD-10-CM

## 2011-12-18 ENCOUNTER — Ambulatory Visit (INDEPENDENT_AMBULATORY_CARE_PROVIDER_SITE_OTHER): Payer: Medicare Other

## 2011-12-18 DIAGNOSIS — J309 Allergic rhinitis, unspecified: Secondary | ICD-10-CM | POA: Diagnosis not present

## 2011-12-24 ENCOUNTER — Encounter: Payer: Self-pay | Admitting: Internal Medicine

## 2011-12-25 ENCOUNTER — Ambulatory Visit (INDEPENDENT_AMBULATORY_CARE_PROVIDER_SITE_OTHER): Payer: Medicare Other

## 2011-12-25 DIAGNOSIS — J309 Allergic rhinitis, unspecified: Secondary | ICD-10-CM

## 2011-12-25 DIAGNOSIS — H2010 Chronic iridocyclitis, unspecified eye: Secondary | ICD-10-CM | POA: Diagnosis not present

## 2011-12-25 DIAGNOSIS — C6930 Malignant neoplasm of unspecified choroid: Secondary | ICD-10-CM | POA: Diagnosis not present

## 2011-12-25 DIAGNOSIS — H04129 Dry eye syndrome of unspecified lacrimal gland: Secondary | ICD-10-CM | POA: Diagnosis not present

## 2011-12-25 DIAGNOSIS — H5210 Myopia, unspecified eye: Secondary | ICD-10-CM | POA: Diagnosis not present

## 2011-12-27 DIAGNOSIS — E785 Hyperlipidemia, unspecified: Secondary | ICD-10-CM | POA: Diagnosis not present

## 2011-12-27 DIAGNOSIS — I517 Cardiomegaly: Secondary | ICD-10-CM | POA: Diagnosis not present

## 2011-12-27 DIAGNOSIS — K219 Gastro-esophageal reflux disease without esophagitis: Secondary | ICD-10-CM | POA: Diagnosis not present

## 2011-12-27 DIAGNOSIS — I700 Atherosclerosis of aorta: Secondary | ICD-10-CM | POA: Diagnosis not present

## 2011-12-27 DIAGNOSIS — I059 Rheumatic mitral valve disease, unspecified: Secondary | ICD-10-CM | POA: Diagnosis not present

## 2011-12-27 DIAGNOSIS — I4891 Unspecified atrial fibrillation: Secondary | ICD-10-CM | POA: Diagnosis not present

## 2011-12-27 DIAGNOSIS — I10 Essential (primary) hypertension: Secondary | ICD-10-CM | POA: Diagnosis not present

## 2011-12-30 DIAGNOSIS — R079 Chest pain, unspecified: Secondary | ICD-10-CM | POA: Diagnosis not present

## 2011-12-30 DIAGNOSIS — I4949 Other premature depolarization: Secondary | ICD-10-CM | POA: Diagnosis not present

## 2011-12-30 DIAGNOSIS — I491 Atrial premature depolarization: Secondary | ICD-10-CM | POA: Diagnosis not present

## 2011-12-30 DIAGNOSIS — E669 Obesity, unspecified: Secondary | ICD-10-CM | POA: Diagnosis present

## 2011-12-30 DIAGNOSIS — M62838 Other muscle spasm: Secondary | ICD-10-CM | POA: Diagnosis not present

## 2011-12-30 DIAGNOSIS — I4891 Unspecified atrial fibrillation: Secondary | ICD-10-CM | POA: Diagnosis not present

## 2011-12-30 DIAGNOSIS — I319 Disease of pericardium, unspecified: Secondary | ICD-10-CM | POA: Diagnosis not present

## 2011-12-30 DIAGNOSIS — K219 Gastro-esophageal reflux disease without esophagitis: Secondary | ICD-10-CM | POA: Diagnosis not present

## 2011-12-30 DIAGNOSIS — I517 Cardiomegaly: Secondary | ICD-10-CM | POA: Diagnosis not present

## 2011-12-30 DIAGNOSIS — I2510 Atherosclerotic heart disease of native coronary artery without angina pectoris: Secondary | ICD-10-CM | POA: Diagnosis present

## 2011-12-30 DIAGNOSIS — I700 Atherosclerosis of aorta: Secondary | ICD-10-CM | POA: Diagnosis not present

## 2011-12-30 DIAGNOSIS — I4510 Unspecified right bundle-branch block: Secondary | ICD-10-CM | POA: Diagnosis not present

## 2011-12-30 DIAGNOSIS — R0602 Shortness of breath: Secondary | ICD-10-CM | POA: Diagnosis not present

## 2011-12-30 DIAGNOSIS — I314 Cardiac tamponade: Secondary | ICD-10-CM | POA: Diagnosis not present

## 2011-12-30 DIAGNOSIS — E875 Hyperkalemia: Secondary | ICD-10-CM | POA: Diagnosis not present

## 2011-12-30 DIAGNOSIS — R609 Edema, unspecified: Secondary | ICD-10-CM | POA: Diagnosis not present

## 2011-12-30 DIAGNOSIS — I498 Other specified cardiac arrhythmias: Secondary | ICD-10-CM | POA: Diagnosis not present

## 2011-12-30 DIAGNOSIS — I10 Essential (primary) hypertension: Secondary | ICD-10-CM | POA: Diagnosis not present

## 2011-12-30 DIAGNOSIS — Z5181 Encounter for therapeutic drug level monitoring: Secondary | ICD-10-CM | POA: Diagnosis not present

## 2011-12-30 DIAGNOSIS — E785 Hyperlipidemia, unspecified: Secondary | ICD-10-CM | POA: Diagnosis not present

## 2011-12-30 DIAGNOSIS — I519 Heart disease, unspecified: Secondary | ICD-10-CM | POA: Diagnosis not present

## 2011-12-31 ENCOUNTER — Ambulatory Visit: Payer: Medicare Other | Admitting: Internal Medicine

## 2012-01-05 ENCOUNTER — Inpatient Hospital Stay (HOSPITAL_COMMUNITY)
Admission: EM | Admit: 2012-01-05 | Discharge: 2012-01-07 | DRG: 310 | Disposition: A | Discharge status: Home / Self Care | Payer: Medicare Other | Attending: Cardiology | Admitting: Cardiology

## 2012-01-05 ENCOUNTER — Encounter (HOSPITAL_COMMUNITY): Payer: Self-pay

## 2012-01-05 DIAGNOSIS — Z79899 Other long term (current) drug therapy: Secondary | ICD-10-CM

## 2012-01-05 DIAGNOSIS — I4891 Unspecified atrial fibrillation: Principal | ICD-10-CM | POA: Diagnosis present

## 2012-01-05 DIAGNOSIS — I452 Bifascicular block: Secondary | ICD-10-CM | POA: Diagnosis present

## 2012-01-05 DIAGNOSIS — I10 Essential (primary) hypertension: Secondary | ICD-10-CM | POA: Diagnosis present

## 2012-01-05 DIAGNOSIS — K219 Gastro-esophageal reflux disease without esophagitis: Secondary | ICD-10-CM | POA: Diagnosis present

## 2012-01-05 DIAGNOSIS — I2510 Atherosclerotic heart disease of native coronary artery without angina pectoris: Secondary | ICD-10-CM | POA: Diagnosis present

## 2012-01-05 DIAGNOSIS — Z860100 Personal history of colon polyps, unspecified: Secondary | ICD-10-CM

## 2012-01-05 DIAGNOSIS — J449 Chronic obstructive pulmonary disease, unspecified: Secondary | ICD-10-CM | POA: Diagnosis present

## 2012-01-05 DIAGNOSIS — E785 Hyperlipidemia, unspecified: Secondary | ICD-10-CM | POA: Diagnosis present

## 2012-01-05 DIAGNOSIS — M129 Arthropathy, unspecified: Secondary | ICD-10-CM | POA: Diagnosis present

## 2012-01-05 DIAGNOSIS — Z87891 Personal history of nicotine dependence: Secondary | ICD-10-CM

## 2012-01-05 DIAGNOSIS — I4811 Longstanding persistent atrial fibrillation: Secondary | ICD-10-CM | POA: Diagnosis present

## 2012-01-05 DIAGNOSIS — R079 Chest pain, unspecified: Secondary | ICD-10-CM | POA: Diagnosis not present

## 2012-01-05 DIAGNOSIS — Z85820 Personal history of malignant melanoma of skin: Secondary | ICD-10-CM

## 2012-01-05 DIAGNOSIS — J4489 Other specified chronic obstructive pulmonary disease: Secondary | ICD-10-CM | POA: Diagnosis present

## 2012-01-05 DIAGNOSIS — R0602 Shortness of breath: Secondary | ICD-10-CM | POA: Diagnosis not present

## 2012-01-05 DIAGNOSIS — Z8249 Family history of ischemic heart disease and other diseases of the circulatory system: Secondary | ICD-10-CM

## 2012-01-05 DIAGNOSIS — I4510 Unspecified right bundle-branch block: Secondary | ICD-10-CM | POA: Diagnosis not present

## 2012-01-05 DIAGNOSIS — Z7901 Long term (current) use of anticoagulants: Secondary | ICD-10-CM

## 2012-01-05 LAB — COMPREHENSIVE METABOLIC PANEL
ALT: 21 U/L (ref 0–35)
Albumin: 2.9 g/dL — ABNORMAL LOW (ref 3.5–5.2)
Alkaline Phosphatase: 81 U/L (ref 39–117)
BUN: 12 mg/dL (ref 6–23)
Calcium: 9.2 mg/dL (ref 8.4–10.5)
GFR calc Af Amer: 63 mL/min — ABNORMAL LOW (ref >90)
Glucose, Bld: 114 mg/dL — ABNORMAL HIGH (ref 70–99)
Potassium: 4.6 mEq/L (ref 3.5–5.1)
Sodium: 138 mEq/L (ref 135–145)
Total Protein: 5.9 g/dL — ABNORMAL LOW (ref 6.0–8.3)

## 2012-01-05 LAB — MAGNESIUM: Magnesium: 2.1 mg/dL (ref 1.5–2.5)

## 2012-01-05 LAB — CBC
Hemoglobin: 13.1 g/dL (ref 12.0–15.0)
MCH: 31.6 pg (ref 26.0–34.0)
MCHC: 33.9 g/dL (ref 30.0–36.0)
RDW: 12.8 % (ref 11.5–15.5)

## 2012-01-05 LAB — CARDIAC PANEL(CRET KIN+CKTOT+MB+TROPI)
CK, MB: 2.1 ng/mL (ref 0.3–4.0)
Relative Index: INVALID (ref 0.0–2.5)
Troponin I: <0.3 ng/mL (ref <0.30)

## 2012-01-05 LAB — TSH: TSH: 2.278 u[IU]/mL (ref 0.350–4.500)

## 2012-01-05 LAB — PROTIME-INR
INR: 3.07 — ABNORMAL HIGH (ref 0.00–1.49)
Prothrombin Time: 32.2 s — ABNORMAL HIGH (ref 11.6–15.2)

## 2012-01-05 LAB — TROPONIN I: Troponin I: <0.3 ng/mL

## 2012-01-05 MED ORDER — DILTIAZEM HCL 100 MG IV SOLR
5.0000 mg/h | INTRAVENOUS | Status: DC
  Administered 2012-01-05 (×2): 5 mg/h via INTRAVENOUS
  Filled 2012-01-05 (×2): qty 100

## 2012-01-05 MED ORDER — DOFETILIDE 250 MCG PO CAPS
250.0000 ug | ORAL_CAPSULE | Freq: Once | ORAL | Status: AC
  Administered 2012-01-05: 250 ug via ORAL
  Filled 2012-01-05: qty 1

## 2012-01-05 MED ORDER — LORATADINE 10 MG PO TABS
10.0000 mg | ORAL_TABLET | Freq: Every day | ORAL | Status: DC
  Administered 2012-01-05 – 2012-01-07 (×3): 10 mg via ORAL
  Filled 2012-01-05 (×3): qty 1

## 2012-01-05 MED ORDER — SUCRALFATE 1 G PO TABS
1.0000 g | ORAL_TABLET | Freq: Two times a day (BID) | ORAL | Status: DC
  Administered 2012-01-05 – 2012-01-07 (×5): 1 g via ORAL
  Filled 2012-01-05 (×6): qty 1

## 2012-01-05 MED ORDER — MAGNESIUM OXIDE 400 MG PO TABS: 400.0000 mg | ORAL_TABLET | Freq: Every day | ORAL | Status: DC

## 2012-01-05 MED ORDER — MAGNESIUM OXIDE 400 (241.3 MG) MG PO TABS
400.0000 mg | ORAL_TABLET | Freq: Every day | ORAL | Status: DC
  Administered 2012-01-05 – 2012-01-07 (×3): 400 mg via ORAL
  Filled 2012-01-05 (×5): qty 1

## 2012-01-05 MED ORDER — METOPROLOL SUCCINATE ER 25 MG PO TB24
25.0000 mg | ORAL_TABLET | Freq: Two times a day (BID) | ORAL | Status: DC
  Administered 2012-01-05 – 2012-01-07 (×5): 25 mg via ORAL
  Filled 2012-01-05 (×6): qty 1

## 2012-01-05 MED ORDER — ACETAMINOPHEN 325 MG PO TABS
650.0000 mg | ORAL_TABLET | ORAL | Status: DC | PRN
  Administered 2012-01-05 – 2012-01-06 (×2): 650 mg via ORAL
  Filled 2012-01-05: qty 2

## 2012-01-05 MED ORDER — CALCIUM CARBONATE 1250 (500 CA) MG PO TABS
1.0000 | ORAL_TABLET | Freq: Two times a day (BID) | ORAL | Status: DC
  Administered 2012-01-05 – 2012-01-07 (×4): 500 mg via ORAL
  Filled 2012-01-05 (×8): qty 1

## 2012-01-05 MED ORDER — DOFETILIDE 250 MCG PO CAPS
250.0000 ug | ORAL_CAPSULE | Freq: Two times a day (BID) | ORAL | Status: DC
  Administered 2012-01-05 – 2012-01-07 (×3): 250 ug via ORAL
  Filled 2012-01-05 (×7): qty 1

## 2012-01-05 MED ORDER — DILTIAZEM HCL 100 MG IV SOLR
5.0000 mg/h | Freq: Once | INTRAVENOUS | Status: AC
  Administered 2012-01-05: 5 mg/h via INTRAVENOUS
  Filled 2012-01-05: qty 100

## 2012-01-05 MED ORDER — SODIUM CHLORIDE 0.9 % IV SOLN
INTRAVENOUS | Status: DC
  Administered 2012-01-05: 10 mL/h via INTRAVENOUS

## 2012-01-05 MED ORDER — ALPRAZOLAM 0.25 MG PO TABS: 0.2500 mg | ORAL_TABLET | Freq: Two times a day (BID) | ORAL | Status: DC | PRN

## 2012-01-05 MED ORDER — LOTEPREDNOL ETABONATE 0.5 % OP SUSP
1.0000 [drp] | Freq: Two times a day (BID) | OPHTHALMIC | Status: DC
  Administered 2012-01-05 – 2012-01-07 (×5): 1 [drp] via OPHTHALMIC
  Filled 2012-01-05: qty 5

## 2012-01-05 MED ORDER — WARFARIN SODIUM 2.5 MG PO TABS
2.5000 mg | ORAL_TABLET | Freq: Every day | ORAL | Status: DC
  Administered 2012-01-05: 2.5 mg via ORAL
  Filled 2012-01-05 (×2): qty 1

## 2012-01-05 MED ORDER — ONDANSETRON HCL 4 MG/2ML IJ SOLN: 4.0000 mg | Freq: Four times a day (QID) | INTRAMUSCULAR | Status: DC | PRN

## 2012-01-05 MED ORDER — DILTIAZEM HCL 25 MG/5ML IV SOLN
10.0000 mg | Freq: Once | INTRAVENOUS | Status: AC
  Administered 2012-01-05: 10 mg via INTRAVENOUS

## 2012-01-05 MED ORDER — ACETAMINOPHEN 325 MG PO TABS
650.0000 mg | ORAL_TABLET | ORAL | Status: DC | PRN
  Filled 2012-01-05: qty 2

## 2012-01-05 MED ORDER — ATORVASTATIN CALCIUM 10 MG PO TABS
10.0000 mg | ORAL_TABLET | Freq: Every day | ORAL | Status: DC
  Administered 2012-01-05 – 2012-01-06 (×2): 10 mg via ORAL
  Filled 2012-01-05 (×3): qty 1

## 2012-01-05 MED ORDER — SODIUM CHLORIDE 0.9 % IV SOLN: INTRAVENOUS | Status: DC

## 2012-01-05 MED ORDER — WARFARIN - PHYSICIAN DOSING INPATIENT
Freq: Every day | Status: DC
  Administered 2012-01-05: 18:00:00

## 2012-01-05 NOTE — ED Notes (Signed)
[name] from [facility] cardiology is at the bedside.

## 2012-01-05 NOTE — ED Notes (Signed)
Informed Dr. Denton Lank that the family and patient are both hesitant to start the patient on the Cardizem drip because it might be contraindicated for her being she on Tikosyn. Dr. Denton Lank informed RN that the patient's cardiologist told Dr. Denton Lank that it is safe to start her on the drip. Informed the family and the patient that it is not contraindicated for the patient to have both the Cardizem gtt and Tikosyn,.

## 2012-01-05 NOTE — ED Notes (Signed)
HR=80's-90's, patient stated that she feels much better.

## 2012-01-05 NOTE — ED Notes (Signed)
Dr. Denton Lank was informed that the patient is on a study medication Ticosin 250 mg that she needs to have at 0800 that is time sensitive for her atrial fibrillation.

## 2012-01-05 NOTE — ED Notes (Signed)
Family and patient stated to hold off on starting the Cardizem gtt because it might interact with the Ticosin that she is taking. Family stated that they sent an envelop with all the list of medicines with the patient but is unable to find it. Will call [facility] if they have her packet.
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
## 2012-01-05 NOTE — ED Notes (Signed)
Pt was treated at [facility] Monday for uncontrolled AFib.  Pt had an ablation done and experienced cardiac tamponade during the procedure.  Pt is currently taking Ticosin (trial with [facility]).  Pt began to experience substernal CP last night at 2100.  Pain was intermittent.  Pt was given 324 mg ASA by EMS.  Pt was told NO NITRO under any circumstances.  Pt's next Ticosin due at 0800.  Pt must take between 0700 and 0900.
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
## 2012-01-06 DIAGNOSIS — I2510 Atherosclerotic heart disease of native coronary artery without angina pectoris: Secondary | ICD-10-CM | POA: Diagnosis not present

## 2012-01-06 DIAGNOSIS — I4891 Unspecified atrial fibrillation: Secondary | ICD-10-CM | POA: Diagnosis not present

## 2012-01-06 DIAGNOSIS — I4510 Unspecified right bundle-branch block: Secondary | ICD-10-CM | POA: Diagnosis not present

## 2012-01-06 DIAGNOSIS — R079 Chest pain, unspecified: Secondary | ICD-10-CM | POA: Diagnosis not present

## 2012-01-06 DIAGNOSIS — J449 Chronic obstructive pulmonary disease, unspecified: Secondary | ICD-10-CM | POA: Diagnosis not present

## 2012-01-06 DIAGNOSIS — I452 Bifascicular block: Secondary | ICD-10-CM | POA: Diagnosis not present

## 2012-01-06 LAB — BASIC METABOLIC PANEL
BUN: 18 mg/dL (ref 6–23)
Calcium: 9.5 mg/dL (ref 8.4–10.5)
Creatinine, Ser: 0.9 mg/dL (ref 0.50–1.10)
GFR calc non Af Amer: 57 mL/min — ABNORMAL LOW (ref >90)
Glucose, Bld: 113 mg/dL — ABNORMAL HIGH (ref 70–99)
Potassium: 3.7 mEq/L (ref 3.5–5.1)

## 2012-01-06 LAB — CBC
HCT: 35.3 % — ABNORMAL LOW (ref 36.0–46.0)
Hemoglobin: 11.9 g/dL — ABNORMAL LOW (ref 12.0–15.0)
MCH: 31.2 pg (ref 26.0–34.0)
MCHC: 33.7 g/dL (ref 30.0–36.0)
MCV: 92.7 fL (ref 78.0–100.0)
RDW: 13 % (ref 11.5–15.5)

## 2012-01-06 LAB — CARDIAC PANEL(CRET KIN+CKTOT+MB+TROPI): Total CK: 17 U/L (ref 7–177)

## 2012-01-06 MED ORDER — WARFARIN SODIUM 2.5 MG PO TABS
2.5000 mg | ORAL_TABLET | Freq: Every day | ORAL | Status: DC
  Filled 2012-01-06: qty 1

## 2012-01-06 MED ORDER — DILTIAZEM HCL ER COATED BEADS 120 MG PO CP24
120.0000 mg | ORAL_CAPSULE | Freq: Every day | ORAL | Status: DC
  Administered 2012-01-06 – 2012-01-07 (×2): 120 mg via ORAL
  Filled 2012-01-06 (×2): qty 1

## 2012-01-06 MED ORDER — POTASSIUM CHLORIDE CRYS ER 20 MEQ PO TBCR
40.0000 meq | EXTENDED_RELEASE_TABLET | Freq: Once | ORAL | Status: AC
  Administered 2012-01-06: 40 meq via ORAL
  Filled 2012-01-06: qty 2

## 2012-01-06 NOTE — Progress Notes (Signed)
Notified MD of K and INR orders received Marisa Cyphers RN

## 2012-01-06 NOTE — Progress Notes (Signed)
Pt converted back to NSR 64, BP:110/50, MD notified  gave order to d/c Cardizem drip if HR less than 55, will continue to monitor, Thanks, Lavonda Jumbo RN

## 2012-01-06 NOTE — Progress Notes (Signed)
[facility]  Subjective: Feels better.  She complains of mild upper left sided CP with inspiration.  Objective: Vital signs in last 24 hours: Temp:  [97.4 F (36.3 C)-97.8 F (36.6 C)] 97.5 F (36.4 C) (08/26 0551) Pulse Rate:  [54-99] 54  (08/26 0940) Resp:  [16-26] 18  (08/26 0551) BP: (105-116)/(50-76) 112/50 mmHg (08/26 0940) SpO2:  [93 %-99 %] 99 % (08/26 0551) Weight:  [68.2 kg (150 lb 5.7 oz)-68.856 kg (151 lb 12.8 oz)] 68.856 kg (151 lb 12.8 oz) (08/26 0551) Last BM Date: 01/04/12  Intake/Output from previous day: 08/25 0701 - 08/26 0700 In: 240 [P.O.:240] Out: 425 [Urine:425] Intake/Output this shift:    Medications Current Facility-Administered Medications  Medication Dose Route Frequency Provider Last Rate Last Dose  . 0.9 %  sodium chloride infusion   Intravenous Continuous Suzi Roots, MD 10 mL/hr at 01/05/12 0823 10 mL/hr at 01/05/12 1610  . 0.9 %  sodium chloride infusion   Intravenous Continuous Nada Boozer, NP      . acetaminophen (TYLENOL) tablet 650 mg  650 mg Oral Q4H PRN Nada Boozer, NP   650 mg at 01/06/12 0830  . acetaminophen (TYLENOL) tablet 650 mg  650 mg Oral Q4H PRN Nada Boozer, NP      . ALPRAZolam Prudy Feeler) tablet 0.25 mg  0.25 mg Oral BID PRN Nada Boozer, NP      . atorvastatin (LIPITOR) tablet 10 mg  10 mg Oral q1800 Nada Boozer, NP   10 mg at 01/05/12 1805  . calcium carbonate (OS-CAL - dosed in mg of elemental calcium) tablet 500 mg of elemental calcium  1 tablet Oral BID Nada Boozer, NP   500 mg of elemental calcium at 01/06/12 0830  . diltiazem (CARDIZEM CD) 24 hr capsule 120 mg  120 mg Oral Daily Eda Paschal [place], [place]      . dofetilide [facility]) capsule 250 mcg  250 mcg Oral BID Nada Boozer, NP   250 mcg at 01/06/12 9604  . loratadine (CLARITIN) tablet 10 mg  10 mg Oral Daily Nada Boozer, NP   10 mg at 01/06/12 0935  . loteprednol (LOTEMAX) 0.5 % ophthalmic suspension 1 drop  1 drop Both Eyes BID Nada Boozer, NP   1 drop at 01/06/12 0939  . magnesium oxide (MAG-OX) tablet 400 mg  400 mg Oral Daily Marykay Lex, MD   400 mg at 01/06/12 0900  . metoprolol succinate (TOPROL-XL) 24 hr tablet 25 mg  25 mg Oral BID Nada Boozer, NP   25 mg at 01/06/12 0940  . ondansetron (ZOFRAN) injection 4 mg  4 mg Intravenous Q6H PRN Nada Boozer, NP      . sucralfate (CARAFATE) tablet 1 g  1 g Oral BID Nada Boozer, NP   1 g at 01/06/12 0940  . warfarin (COUMADIN) tablet 2.5 mg  2.5 mg Oral q1800 Nada Boozer, NP   2.5 mg at 01/05/12 1805  . Warfarin - Physician Dosing Inpatient   Does not apply q1800 Marykay Lex, MD      . DISCONTD: diltiazem (CARDIZEM) 100 mg in dextrose 5 % 100 mL infusion  5 mg/hr Intravenous Titrated Abelino Derrick, [place]   5 mg/hr at 01/05/12 2141  . DISCONTD: magnesium oxide (MAG-OX) tablet 400 mg  400 mg Oral Daily Nada Boozer, NP        PE: General appearance: alert, cooperative and no distress Lungs: clear to auscultation bilaterally Heart: regular rate and  rhythm, S1, S2 normal, no murmur, click, rub or gallop Extremities: No LEE Pulses: 2+ and symmetric Skin: Warm and Dry. Neurologic: Grossly normal  Lab Results:   Basename 01/06/12 0429 01/05/12 0752  WBC 13.5* 10.7*  HGB 11.9* 13.1  HCT 35.3* 38.6  PLT 194 212   BMET  Basename 01/06/12 0429 01/05/12 0752  NA 137 138  K 3.7 4.6  CL 103 103  CO2 24 25  GLUCOSE 113* 114*  BUN 18 12  CREATININE 0.90 0.94  CALCIUM 9.5 9.2   PT/INR  Basename 01/06/12 0429 01/05/12 0752  LABPROT 35.5* 32.2*  INR 3.48* 3.07*      Assessment/Plan  Principal Problem:  *PAF (paroxysmal atrial fibrillation), after a. fib ablation at [facility], now with RVR Active Problems:  Right bundle branch block and left anterior fascicular block  Chronic anticoagulation, now coumadin  CAD, mild by cath 2001 and 09/04/11  Plan:  The patient spontaneously converted to NSR.  Cardizem changed to PO 120mg  CD.  Also on Toprol  XL.Bradycardia.  INR mildly supratherapeutic.   Recommend monitoring on PO one more night.     LOS: 1 day    HAGER, BRYAN 01/06/2012 10:57 AM  I have seen & examined the patient today after Mr. Leron Croak.  I agree with his findings, examination & plan.  She continues to look & feel better, spontaneously converted overnight. Agree with plan to convert to PO Diltiazem along with Toprol (may be able to consolidate to hight BB dose on d/c or as OP) along with dofetilide. Monitor overnight - with ambualtion.  She did have some mild sharp L sided chest pain under her L breast that sounds Pleuritic/pericarditic -- await results of echo to see if effusion present with ? mild pericarditis.  INR therapeutic. Continue statin.  Marykay Lex, M.D., M.S. [facility] [address] [place], [place]  [address]  [phone] Pager # [phone]  01/06/2012 2:48 PM

## 2012-01-06 NOTE — Progress Notes (Signed)
Text page [facility]. PA Corine Shelter returned page Notified of Pt's HR NSR 54 BP 112/50. Stopped IV cardizem. Orders received for PO Cardizem  - Marisa Cyphers RN

## 2012-01-07 ENCOUNTER — Ambulatory Visit: Payer: Medicare Other | Admitting: Internal Medicine

## 2012-01-07 DIAGNOSIS — Z85820 Personal history of malignant melanoma of skin: Secondary | ICD-10-CM | POA: Diagnosis not present

## 2012-01-07 DIAGNOSIS — I2510 Atherosclerotic heart disease of native coronary artery without angina pectoris: Secondary | ICD-10-CM | POA: Diagnosis present

## 2012-01-07 DIAGNOSIS — I4891 Unspecified atrial fibrillation: Secondary | ICD-10-CM | POA: Diagnosis not present

## 2012-01-07 DIAGNOSIS — Z7901 Long term (current) use of anticoagulants: Secondary | ICD-10-CM | POA: Diagnosis not present

## 2012-01-07 DIAGNOSIS — I4510 Unspecified right bundle-branch block: Secondary | ICD-10-CM | POA: Diagnosis not present

## 2012-01-07 DIAGNOSIS — J449 Chronic obstructive pulmonary disease, unspecified: Secondary | ICD-10-CM | POA: Diagnosis not present

## 2012-01-07 DIAGNOSIS — I10 Essential (primary) hypertension: Secondary | ICD-10-CM | POA: Diagnosis present

## 2012-01-07 DIAGNOSIS — K219 Gastro-esophageal reflux disease without esophagitis: Secondary | ICD-10-CM | POA: Diagnosis present

## 2012-01-07 DIAGNOSIS — Z8249 Family history of ischemic heart disease and other diseases of the circulatory system: Secondary | ICD-10-CM | POA: Diagnosis not present

## 2012-01-07 DIAGNOSIS — R079 Chest pain, unspecified: Secondary | ICD-10-CM | POA: Diagnosis not present

## 2012-01-07 DIAGNOSIS — Z79899 Other long term (current) drug therapy: Secondary | ICD-10-CM | POA: Diagnosis not present

## 2012-01-07 DIAGNOSIS — J4489 Other specified chronic obstructive pulmonary disease: Secondary | ICD-10-CM | POA: Diagnosis present

## 2012-01-07 DIAGNOSIS — Z87891 Personal history of nicotine dependence: Secondary | ICD-10-CM | POA: Diagnosis not present

## 2012-01-07 DIAGNOSIS — E785 Hyperlipidemia, unspecified: Secondary | ICD-10-CM | POA: Diagnosis present

## 2012-01-07 DIAGNOSIS — I452 Bifascicular block: Secondary | ICD-10-CM | POA: Diagnosis present

## 2012-01-07 DIAGNOSIS — M129 Arthropathy, unspecified: Secondary | ICD-10-CM | POA: Diagnosis present

## 2012-01-07 DIAGNOSIS — Z86010 Personal history of colonic polyps: Secondary | ICD-10-CM | POA: Diagnosis not present

## 2012-01-07 LAB — PROTIME-INR: Prothrombin Time: 31.5 seconds — ABNORMAL HIGH (ref 11.6–15.2)

## 2012-01-07 MED ORDER — DILTIAZEM HCL ER COATED BEADS 120 MG PO CP24: 120.0000 mg | ORAL_CAPSULE | Freq: Every day | ORAL | Status: DC

## 2012-01-07 NOTE — Progress Notes (Signed)
[facility]  Subjective: No Complaints.  Up ambulating w/o difficulty  Objective: Vital signs in last 24 hours: Temp:  [97.8 F (36.6 C)-99.8 F (37.7 C)] 98.9 F (37.2 C) (08/27 0505) Pulse Rate:  [65-102] 98  (08/27 1010) Resp:  [16-20] 18  (08/27 0505) BP: (111-145)/(64-80) 128/66 mmHg (08/27 1010) SpO2:  [94 %-99 %] 97 % (08/27 0505) Weight:  [69.446 kg (153 lb 1.6 oz)] 69.446 kg (153 lb 1.6 oz) (08/27 0505) Last BM Date: 01/07/12  Intake/Output from previous day: 08/26 0701 - 08/27 0700 In: 480 [P.O.:480] Out: 820 [Urine:820] Intake/Output this shift: Total I/O In: 240 [P.O.:240] Out: 150 [Urine:150]  Medications Current Facility-Administered Medications  Medication Dose Route Frequency Provider Last Rate Last Dose  . 0.9 %  sodium chloride infusion   Intravenous Continuous Suzi Roots, MD 10 mL/hr at 01/05/12 0823 10 mL/hr at 01/05/12 9604  . 0.9 %  sodium chloride infusion   Intravenous Continuous Nada Boozer, NP      . acetaminophen (TYLENOL) tablet 650 mg  650 mg Oral Q4H PRN Nada Boozer, NP   650 mg at 01/06/12 0830  . acetaminophen (TYLENOL) tablet 650 mg  650 mg Oral Q4H PRN Nada Boozer, NP      . ALPRAZolam Prudy Feeler) tablet 0.25 mg  0.25 mg Oral BID PRN Nada Boozer, NP      . atorvastatin (LIPITOR) tablet 10 mg  10 mg Oral q1800 Nada Boozer, NP   10 mg at 01/06/12 1733  . calcium carbonate (OS-CAL - dosed in mg of elemental calcium) tablet 500 mg of elemental calcium  1 tablet Oral BID Nada Boozer, NP   500 mg of elemental calcium at 01/07/12 1009  . diltiazem (CARDIZEM CD) 24 hr capsule 120 mg  120 mg Oral Daily Eda Paschal [place], [place]   120 mg at 01/07/12 1010  . dofetilide (TIKOSYN) capsule 250 mcg  250 mcg Oral BID Nada Boozer, NP   250 mcg at 01/07/12 0831  . loratadine (CLARITIN) tablet 10 mg  10 mg Oral Daily Nada Boozer, NP   10 mg at 01/07/12 1009  . loteprednol (LOTEMAX) 0.5 % ophthalmic suspension 1 drop  1 drop Both  Eyes BID Nada Boozer, NP   1 drop at 01/07/12 1011  . magnesium oxide (MAG-OX) tablet 400 mg  400 mg Oral Daily Marykay Lex, MD   400 mg at 01/07/12 1010  . metoprolol succinate (TOPROL-XL) 24 hr tablet 25 mg  25 mg Oral BID Nada Boozer, NP   25 mg at 01/07/12 1009  . ondansetron (ZOFRAN) injection 4 mg  4 mg Intravenous Q6H PRN Nada Boozer, NP      . potassium chloride SA (K-DUR,KLOR-CON) CR tablet 40 mEq  40 mEq Oral Once Wilburt Finlay, PA   40 mEq at 01/06/12 1307  . sucralfate (CARAFATE) tablet 1 g  1 g Oral BID Nada Boozer, NP   1 g at 01/07/12 1010  . warfarin (COUMADIN) tablet 2.5 mg  2.5 mg Oral q1800 Wilburt Finlay, PA      . Warfarin - Physician Dosing Inpatient   Does not apply q1800 Marykay Lex, MD      . DISCONTD: warfarin (COUMADIN) tablet 2.5 mg  2.5 mg Oral q1800 Wilburt Finlay, PA   2.5 mg at 01/05/12 1805    PE: General appearance: alert, cooperative and no distress Lungs: clear to auscultation bilaterally Heart: regular rate and rhythm, S1, S2 normal, no murmur, click, rub  or gallop Extremities: No LEE Pulses: 2+ and symmetric Skin: warm and dry Neurologic: Grossly normal  Lab Results:   Basename 01/06/12 0429 01/05/12 0752  WBC 13.5* 10.7*  HGB 11.9* 13.1  HCT 35.3* 38.6  PLT 194 212   BMET  Basename 01/06/12 0429 01/05/12 0752  NA 137 138  K 3.7 4.6  CL 103 103  CO2 24 25  GLUCOSE 113* 114*  BUN 18 12  CREATININE 0.90 0.94  CALCIUM 9.5 9.2   PT/INR  Basename 01/07/12 0618 01/06/12 0429 01/05/12 0752  LABPROT 31.5* 35.5* 32.2*  INR 2.99* 3.48* 3.07*    Assessment/Plan  Principal Problem:  *PAF (paroxysmal atrial fibrillation), after a. fib ablation at [facility], now with RVR Active Problems:  Right bundle branch block and left anterior fascicular block  Chronic anticoagulation, now coumadin  CAD, mild by cath 2001 and 09/04/11  Plan:  Back in Afib with rate ~100.  She was in NSR at 0100hrs today.  PO cardizem CD 120mg , Lopressor 25 BID  and Tikosyn.  Will likely have intermittent AF and NSR until scarring from ablation occurs.  DC home today on current therapy.   LOS: 2 days    HAGER, BRYAN 01/07/2012 10:51 AM   I have seen and examined the patient along with Wilburt Finlay, PA.  I have reviewed the chart, notes and new data.  I agree with PA's note.  Key new complaints: Feels occasional tachycardia Key examination changes: in NSR now, frequent PACs, rare brief atrial tachycardia on monitor. Key new findings / data: QTc 487 ms,  Earlier had a slow atrial flutter with variable AV block on ECG.   PLAN: DC home. Keep scheduled appointment on Friday and recheck QTc interval due to potential diltiazem-dofetilide interaction.  Thurmon Fair, MD, [facility] [phone] 01/07/2012, 11:46 AM

## 2012-01-07 NOTE — Progress Notes (Deleted)
Utilization review complete 

## 2012-01-07 NOTE — Discharge Summary (Signed)
Physician Discharge Summary  Patient ID: [name] MRN: [id number] DOB/AGE: 1926-04-02 78 y.o.  Admit date: 01/05/2012 Discharge date: 01/07/2012  Admission Diagnoses:  Discharge Diagnoses:  Principal Problem:  *PAF (paroxysmal atrial fibrillation), after a. fib ablation at [facility], now with RVR Active Problems:  Right bundle branch block and left anterior fascicular block  Chronic anticoagulation, now coumadin  CAD, mild by cath 2001 and 09/04/11   Discharged Condition: stable  Hospital Course:   78 year old female with recent history of A. Fib ablation at [facility] by Dr. Hurman Horn secondary to persistent A. Fib with anticoagulation now on coumadin, presents to the ER with substernal chest pressure, but more SOB last pm at 2100. This AM when she was up she had near syncope with a drop in her systolic BP to the 70's. She was given ASA by EMS but has been instructed not to take NTG ( she has had syncope in the past with NTG) . She recently underwent the ablation at [facility] complicated by cardiac tamponade during the procedure. Pt. Is currently on Tikosyn.  Reviewing information from [facility] and discussing with the patient and her family she's had several episodes of paroxysmal A. Fib since the ablation. The ablation was complicated by cardiac tamponade had a pericardial drain that she kept overnight. She has continued to be short of breath with exertion that she was prior to the procedure and continues post procedure.   She has a history of cardiac cath 09/03/11 with nonobstructive CAD. Currently negative Troponin, INR of 3.07, now on coumadin, and she has been placed on a cardizem drip. VS stable. She has maintained rate control on the Cardizem drip at 5 mg an hour and she felts very well without any complaints.  She was converted to PO cardizem CD 120mg .  She spontaneously converted to NSR and then back to afib rate controlled.  Last QTc .  She has done quite well.  She was  seen by Dr. Royann Shivers and is stable for discharged home.  She has an appt this Friday at which time we will recheck EKG and evaluate QTc    Consults: None  Significant Diagnostic Studies:  None  Treatments: IV diltiazem, PO diltiazem, Tikosyn  Discharge Exam: Blood pressure 128/66, pulse 98, temperature 98.9 F (37.2 C), temperature source Oral, resp. rate 18, height 5\' 2"  (1.575 m), weight 69.446 kg (153 lb 1.6 oz), SpO2 97.00%.   Disposition: 01-Home or Self Care  Discharge Orders    Future Orders Please Complete By Expires   Diet - low sodium heart healthy      Increase activity slowly        Medication List  As of 01/07/2012 11:54 AM   STOP taking these medications         ipratropium 0.06 % nasal spray         TAKE these medications         acetaminophen 325 MG tablet   Commonly known as: TYLENOL   Take 2 tablets (650 mg total) by mouth every 4 (four) hours as needed.      calcium carbonate 1250 MG tablet   Commonly known as: OS-CAL - dosed in mg of elemental calcium   Take 1 tablet by mouth 2 (two) times daily.      diltiazem 120 MG 24 hr capsule   Commonly known as: CARDIZEM CD   Take 1 capsule (120 mg total) by mouth daily.  dofetilide 250 MCG capsule   Commonly known as: TIKOSYN   Take 250 mcg by mouth 2 (two) times daily. Schedule is 8am and 8pm and cannot vary more than 1 hour      Fish Oil 1000 MG Caps   Take 2 capsules by mouth 2 (two) times daily.      furosemide 20 MG tablet   Commonly known as: LASIX   Take 1 tablet (20 mg total) by mouth daily as needed. If needed for edema      loratadine 10 MG tablet   Commonly known as: CLARITIN   Take 10 mg by mouth daily as needed. For allergy symptoms      loteprednol 0.5 % ophthalmic suspension   Commonly known as: LOTEMAX   Place 1 drop into both eyes 2 (two) times daily.      magnesium oxide 400 MG tablet   Commonly known as: MAG-OX   Take 400 mg by mouth daily.      metoprolol succinate 25  MG 24 hr tablet   Commonly known as: TOPROL-XL   Take 25 mg by mouth 2 (two) times daily.      multivitamins ther. w/minerals Tabs   Take 1 tablet by mouth every morning.      rosuvastatin 5 MG tablet   Commonly known as: CRESTOR   Take 5 mg by mouth at bedtime.      sucralfate 1 G tablet   Commonly known as: CARAFATE   Take 1 g by mouth 2 (two) times daily.      vitamin E 400 UNIT capsule   Take 800 Units by mouth daily.      warfarin 2.5 MG tablet   Commonly known as: COUMADIN   Take 2.5 mg by mouth daily.           Follow-up Information    Follow up with Runell Gess, MD on 01/10/2012. (Keep current appt. time.)    Contact information:   [address] [place] [address] [phone]          Signed: Wilburt Finlay 01/07/2012, 11:54 AM  Cc:  Dr. Alberteen Sam

## 2012-01-07 NOTE — Progress Notes (Signed)
IV d/c'd.  Tele d/c'd.  Pt d/c'd to home.  Home meds and d/c instructions have been discussed with pt.  Pt denies any questions or concerns at this time.  Pt leaving unit via wheelchair and appears in no acute distress. Vivyan Biggers RN 

## 2012-01-07 NOTE — Progress Notes (Signed)
Utilization review complete 

## 2012-01-10 DIAGNOSIS — E782 Mixed hyperlipidemia: Secondary | ICD-10-CM | POA: Diagnosis not present

## 2012-01-10 DIAGNOSIS — I10 Essential (primary) hypertension: Secondary | ICD-10-CM | POA: Diagnosis not present

## 2012-01-10 DIAGNOSIS — I4891 Unspecified atrial fibrillation: Secondary | ICD-10-CM | POA: Diagnosis not present

## 2012-01-10 DIAGNOSIS — R0602 Shortness of breath: Secondary | ICD-10-CM | POA: Diagnosis not present

## 2012-01-11 DIAGNOSIS — I4891 Unspecified atrial fibrillation: Secondary | ICD-10-CM | POA: Diagnosis not present

## 2012-01-16 ENCOUNTER — Inpatient Hospital Stay (HOSPITAL_COMMUNITY)
Admission: EM | Admit: 2012-01-16 | Discharge: 2012-01-24 | DRG: 286 | Disposition: A | Discharge status: Home / Self Care | Payer: Medicare Other | Attending: Cardiovascular Disease | Admitting: Cardiovascular Disease

## 2012-01-16 ENCOUNTER — Emergency Department (HOSPITAL_COMMUNITY): Payer: Medicare Other

## 2012-01-16 ENCOUNTER — Encounter (HOSPITAL_COMMUNITY): Payer: Self-pay | Admitting: Emergency Medicine

## 2012-01-16 DIAGNOSIS — I2510 Atherosclerotic heart disease of native coronary artery without angina pectoris: Secondary | ICD-10-CM | POA: Diagnosis present

## 2012-01-16 DIAGNOSIS — J4489 Other specified chronic obstructive pulmonary disease: Secondary | ICD-10-CM | POA: Diagnosis not present

## 2012-01-16 DIAGNOSIS — E785 Hyperlipidemia, unspecified: Secondary | ICD-10-CM | POA: Diagnosis present

## 2012-01-16 DIAGNOSIS — Z7901 Long term (current) use of anticoagulants: Secondary | ICD-10-CM

## 2012-01-16 DIAGNOSIS — Z860100 Personal history of colon polyps, unspecified: Secondary | ICD-10-CM

## 2012-01-16 DIAGNOSIS — I480 Paroxysmal atrial fibrillation: Secondary | ICD-10-CM

## 2012-01-16 DIAGNOSIS — I452 Bifascicular block: Secondary | ICD-10-CM | POA: Diagnosis present

## 2012-01-16 DIAGNOSIS — R791 Abnormal coagulation profile: Secondary | ICD-10-CM | POA: Diagnosis present

## 2012-01-16 DIAGNOSIS — I10 Essential (primary) hypertension: Secondary | ICD-10-CM | POA: Diagnosis present

## 2012-01-16 DIAGNOSIS — I739 Peripheral vascular disease, unspecified: Secondary | ICD-10-CM | POA: Diagnosis not present

## 2012-01-16 DIAGNOSIS — J449 Chronic obstructive pulmonary disease, unspecified: Secondary | ICD-10-CM | POA: Diagnosis not present

## 2012-01-16 DIAGNOSIS — J9819 Other pulmonary collapse: Secondary | ICD-10-CM | POA: Diagnosis not present

## 2012-01-16 DIAGNOSIS — T45515A Adverse effect of anticoagulants, initial encounter: Secondary | ICD-10-CM

## 2012-01-16 DIAGNOSIS — J441 Chronic obstructive pulmonary disease with (acute) exacerbation: Secondary | ICD-10-CM | POA: Diagnosis present

## 2012-01-16 DIAGNOSIS — Z87891 Personal history of nicotine dependence: Secondary | ICD-10-CM

## 2012-01-16 DIAGNOSIS — I319 Disease of pericardium, unspecified: Principal | ICD-10-CM | POA: Diagnosis present

## 2012-01-16 DIAGNOSIS — I509 Heart failure, unspecified: Secondary | ICD-10-CM | POA: Diagnosis present

## 2012-01-16 DIAGNOSIS — I517 Cardiomegaly: Secondary | ICD-10-CM | POA: Diagnosis not present

## 2012-01-16 DIAGNOSIS — I6789 Other cerebrovascular disease: Secondary | ICD-10-CM | POA: Diagnosis not present

## 2012-01-16 DIAGNOSIS — R0989 Other specified symptoms and signs involving the circulatory and respiratory systems: Secondary | ICD-10-CM | POA: Diagnosis not present

## 2012-01-16 DIAGNOSIS — I314 Cardiac tamponade: Secondary | ICD-10-CM | POA: Diagnosis present

## 2012-01-16 DIAGNOSIS — I6340 Cerebral infarction due to embolism of unspecified cerebral artery: Secondary | ICD-10-CM | POA: Diagnosis not present

## 2012-01-16 DIAGNOSIS — I639 Cerebral infarction, unspecified: Secondary | ICD-10-CM

## 2012-01-16 DIAGNOSIS — K219 Gastro-esophageal reflux disease without esophagitis: Secondary | ICD-10-CM | POA: Diagnosis present

## 2012-01-16 DIAGNOSIS — J90 Pleural effusion, not elsewhere classified: Secondary | ICD-10-CM | POA: Diagnosis not present

## 2012-01-16 DIAGNOSIS — I4811 Longstanding persistent atrial fibrillation: Secondary | ICD-10-CM | POA: Diagnosis present

## 2012-01-16 DIAGNOSIS — I313 Pericardial effusion (noninflammatory): Secondary | ICD-10-CM

## 2012-01-16 DIAGNOSIS — R0609 Other forms of dyspnea: Secondary | ICD-10-CM | POA: Diagnosis present

## 2012-01-16 DIAGNOSIS — I309 Acute pericarditis, unspecified: Secondary | ICD-10-CM | POA: Diagnosis not present

## 2012-01-16 DIAGNOSIS — I4510 Unspecified right bundle-branch block: Secondary | ICD-10-CM | POA: Diagnosis present

## 2012-01-16 DIAGNOSIS — R4182 Altered mental status, unspecified: Secondary | ICD-10-CM | POA: Diagnosis not present

## 2012-01-16 DIAGNOSIS — I4891 Unspecified atrial fibrillation: Secondary | ICD-10-CM | POA: Diagnosis present

## 2012-01-16 DIAGNOSIS — R0602 Shortness of breath: Secondary | ICD-10-CM | POA: Diagnosis not present

## 2012-01-16 DIAGNOSIS — N289 Disorder of kidney and ureter, unspecified: Secondary | ICD-10-CM | POA: Diagnosis not present

## 2012-01-16 HISTORY — DX: Cardiac tamponade: I31.4

## 2012-01-16 LAB — BASIC METABOLIC PANEL
BUN: 23 mg/dL (ref 6–23)
Calcium: 9.1 mg/dL (ref 8.4–10.5)
GFR calc Af Amer: 41 mL/min — ABNORMAL LOW (ref >90)
GFR calc non Af Amer: 35 mL/min — ABNORMAL LOW (ref >90)
Glucose, Bld: 109 mg/dL — ABNORMAL HIGH (ref 70–99)
Potassium: 4.8 mEq/L (ref 3.5–5.1)
Sodium: 136 mEq/L (ref 135–145)

## 2012-01-16 LAB — POCT I-STAT, CHEM 8
BUN: 24 mg/dL — ABNORMAL HIGH (ref 6–23)
Calcium, Ion: 1.11 mmol/L — ABNORMAL LOW (ref 1.13–1.30)
Chloride: 106 mEq/L (ref 96–112)
Glucose, Bld: 106 mg/dL — ABNORMAL HIGH (ref 70–99)
TCO2: 23 mmol/L (ref 0–100)

## 2012-01-16 LAB — PROTIME-INR
INR: 5.52 (ref 0.00–1.49)
Prothrombin Time: 50.9 seconds — ABNORMAL HIGH (ref 11.6–15.2)

## 2012-01-16 LAB — CBC
MCH: 30.6 pg (ref 26.0–34.0)
MCHC: 32.9 g/dL (ref 30.0–36.0)
RDW: 13 % (ref 11.5–15.5)

## 2012-01-16 MED ORDER — DOFETILIDE 250 MCG PO CAPS
250.0000 ug | ORAL_CAPSULE | Freq: Two times a day (BID) | ORAL | Status: DC
  Administered 2012-01-17 – 2012-01-24 (×15): 250 ug via ORAL
  Filled 2012-01-16 (×20): qty 1

## 2012-01-16 MED ORDER — ACETAMINOPHEN 325 MG PO TABS: 650.0000 mg | ORAL_TABLET | ORAL | Status: DC | PRN

## 2012-01-16 MED ORDER — MAGNESIUM OXIDE 400 (241.3 MG) MG PO TABS
400.0000 mg | ORAL_TABLET | Freq: Every day | ORAL | Status: DC
  Administered 2012-01-17 – 2012-01-24 (×8): 400 mg via ORAL
  Filled 2012-01-16 (×9): qty 1

## 2012-01-16 MED ORDER — METOPROLOL TARTRATE 12.5 MG HALF TABLET
12.5000 mg | ORAL_TABLET | Freq: Two times a day (BID) | ORAL | Status: DC
  Administered 2012-01-17 – 2012-01-21 (×11): 12.5 mg via ORAL
  Filled 2012-01-16 (×8): qty 1
  Filled 2012-01-16: qty 0.5
  Filled 2012-01-16 (×5): qty 1
  Filled 2012-01-16: qty 0.5

## 2012-01-16 MED ORDER — VITAMIN K1 10 MG/ML IJ SOLN
2.5000 mg | Freq: Once | INTRAVENOUS | Status: AC
  Administered 2012-01-17: 2.5 mg via INTRAVENOUS
  Filled 2012-01-16: qty 0.25

## 2012-01-16 MED ORDER — ATORVASTATIN CALCIUM 10 MG PO TABS
10.0000 mg | ORAL_TABLET | Freq: Every day | ORAL | Status: DC
  Administered 2012-01-17 – 2012-01-23 (×7): 10 mg via ORAL
  Filled 2012-01-16 (×10): qty 1

## 2012-01-16 MED ORDER — HYDROCODONE-ACETAMINOPHEN 5-325 MG PO TABS
1.0000 | ORAL_TABLET | ORAL | Status: DC | PRN
  Administered 2012-01-19: 1 via ORAL
  Filled 2012-01-16: qty 1

## 2012-01-16 MED ORDER — CALCIUM CARBONATE 1250 (500 CA) MG PO TABS
1.0000 | ORAL_TABLET | Freq: Two times a day (BID) | ORAL | Status: DC
  Administered 2012-01-17 – 2012-01-24 (×15): 500 mg via ORAL
  Filled 2012-01-16 (×20): qty 1

## 2012-01-16 MED ORDER — MORPHINE SULFATE 2 MG/ML IJ SOLN
2.0000 mg | INTRAMUSCULAR | Status: DC | PRN
  Administered 2012-01-17: 2 mg via INTRAVENOUS
  Filled 2012-01-16: qty 1

## 2012-01-16 MED ORDER — SUCRALFATE 1 G PO TABS
1.0000 g | ORAL_TABLET | Freq: Two times a day (BID) | ORAL | Status: DC
  Administered 2012-01-17 – 2012-01-24 (×15): 1 g via ORAL
  Filled 2012-01-16 (×20): qty 1

## 2012-01-16 MED ORDER — ZOLPIDEM TARTRATE 5 MG PO TABS: 5.0000 mg | ORAL_TABLET | Freq: Every evening | ORAL | Status: DC | PRN

## 2012-01-16 MED ORDER — ACETAMINOPHEN 650 MG RE SUPP: 650.0000 mg | Freq: Four times a day (QID) | RECTAL | Status: DC | PRN

## 2012-01-16 MED ORDER — ACETAMINOPHEN 325 MG PO TABS: 650.0000 mg | ORAL_TABLET | Freq: Four times a day (QID) | ORAL | Status: DC | PRN

## 2012-01-16 MED ORDER — LOTEPREDNOL ETABONATE 0.5 % OP SUSP
1.0000 [drp] | Freq: Two times a day (BID) | OPHTHALMIC | Status: DC
  Administered 2012-01-17 – 2012-01-24 (×16): 1 [drp] via OPHTHALMIC
  Filled 2012-01-16 (×2): qty 5

## 2012-01-16 MED ORDER — SODIUM CHLORIDE 0.9 % IJ SOLN
3.0000 mL | Freq: Two times a day (BID) | INTRAMUSCULAR | Status: DC
  Administered 2012-01-17 – 2012-01-24 (×13): 3 mL via INTRAVENOUS

## 2012-01-16 NOTE — ED Notes (Signed)
Saw Dr C from cardiology today; reports has fluid in chest; c/o SOB, CP, reports heart has been going in and out of rhythm, had ablation done on aug 19th in [place], went home on 23rd, had to come to hospital on 25th d/t heart was out of rhythm again

## 2012-01-16 NOTE — ED Notes (Signed)
Pt to 2900, unit is aware that patient does not currently have IV established. IV team will see patient on unit. Floor is agreeing to this.
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
## 2012-01-16 NOTE — ED Notes (Signed)
Notified Dr Royann Shivers of pt's arrival to ED

## 2012-01-16 NOTE — ED Notes (Addendum)
IV attempted x2, unsuccessful, IV team paged
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
## 2012-01-16 NOTE — ED Notes (Signed)
Pt in c/o right flank pain that started this afternoon, states pain has subsided at this time, states she saw her cardiologist today for fatigue, pt is two weeks post ablation. States since that procedure she has noted increased weakness, states she was told she still had fluid in her chest and the MD increased her diuretic. Pt in today due to flank pain.

## 2012-01-16 NOTE — ED Notes (Signed)
Please contact Dr. Despina Hidden upon pt's arrival to ED for further evaluation of chest pain

## 2012-01-17 ENCOUNTER — Encounter (HOSPITAL_COMMUNITY): Payer: Self-pay | Admitting: *Deleted

## 2012-01-17 ENCOUNTER — Encounter (HOSPITAL_COMMUNITY)
Admission: EM | Disposition: A | Discharge status: Home / Self Care | Payer: Self-pay | Source: Home / Self Care | Attending: Cardiovascular Disease

## 2012-01-17 ENCOUNTER — Other Ambulatory Visit: Payer: Self-pay

## 2012-01-17 DIAGNOSIS — I314 Cardiac tamponade: Secondary | ICD-10-CM | POA: Diagnosis present

## 2012-01-17 DIAGNOSIS — N289 Disorder of kidney and ureter, unspecified: Secondary | ICD-10-CM | POA: Diagnosis not present

## 2012-01-17 DIAGNOSIS — R55 Syncope and collapse: Secondary | ICD-10-CM | POA: Insufficient documentation

## 2012-01-17 HISTORY — DX: Cardiac tamponade: I31.4

## 2012-01-17 HISTORY — PX: RIGHT HEART CATHETERIZATION: SHX5447

## 2012-01-17 LAB — PROTIME-INR: Prothrombin Time: 28.4 seconds — ABNORMAL HIGH (ref 11.6–15.2)

## 2012-01-17 LAB — POCT I-STAT 3, VENOUS BLOOD GAS (G3P V)
Acid-base deficit: 4 mmol/L — ABNORMAL HIGH (ref 0.0–2.0)
Bicarbonate: 19.9 mEq/L — ABNORMAL LOW (ref 20.0–24.0)
O2 Saturation: 61 %
TCO2: 21 mmol/L (ref 0–100)
pCO2, Ven: 33.3 mmHg — ABNORMAL LOW (ref 45.0–50.0)
pO2, Ven: 32 mmHg (ref 30.0–45.0)

## 2012-01-17 LAB — MRSA PCR SCREENING: MRSA by PCR: NEGATIVE

## 2012-01-17 SURGERY — RIGHT HEART CATH
Anesthesia: LOCAL

## 2012-01-17 MED ORDER — ONDANSETRON HCL 4 MG/2ML IJ SOLN: 4.0000 mg | Freq: Four times a day (QID) | INTRAMUSCULAR | Status: DC | PRN

## 2012-01-17 MED ORDER — SODIUM CHLORIDE 0.9 % IJ SOLN
3.0000 mL | Freq: Two times a day (BID) | INTRAMUSCULAR | Status: DC
  Administered 2012-01-18 – 2012-01-19 (×4): 3 mL via INTRAVENOUS

## 2012-01-17 MED ORDER — DILTIAZEM LOAD VIA INFUSION
20.0000 mg | Freq: Once | INTRAVENOUS | Status: AC
  Administered 2012-01-17: 20 mg via INTRAVENOUS

## 2012-01-17 MED ORDER — DILTIAZEM HCL 100 MG IV SOLR
5.0000 mg/h | INTRAVENOUS | Status: DC
  Administered 2012-01-17 – 2012-01-19 (×2): 10 mg/h via INTRAVENOUS

## 2012-01-17 MED ORDER — FUROSEMIDE 10 MG/ML IJ SOLN
20.0000 mg | Freq: Once | INTRAMUSCULAR | Status: AC
  Administered 2012-01-17: 20 mg via INTRAVENOUS

## 2012-01-17 MED ORDER — ACETAMINOPHEN 325 MG PO TABS
650.0000 mg | ORAL_TABLET | ORAL | Status: DC | PRN
  Administered 2012-01-18 – 2012-01-20 (×3): 650 mg via ORAL
  Filled 2012-01-17 (×3): qty 2

## 2012-01-17 MED ORDER — FUROSEMIDE 10 MG/ML IJ SOLN
INTRAMUSCULAR | Status: AC
  Administered 2012-01-17: 20 mg via INTRAVENOUS
  Filled 2012-01-17: qty 4

## 2012-01-17 MED ORDER — SODIUM CHLORIDE 0.9 % IJ SOLN: 3.0000 mL | INTRAMUSCULAR | Status: DC | PRN

## 2012-01-17 MED ORDER — HEPARIN (PORCINE) IN NACL 2-0.9 UNIT/ML-% IJ SOLN
INTRAMUSCULAR | Status: AC
  Filled 2012-01-17: qty 2000

## 2012-01-17 MED ORDER — LIDOCAINE HCL (PF) 1 % IJ SOLN
INTRAMUSCULAR | Status: AC
  Filled 2012-01-17: qty 30

## 2012-01-17 MED ORDER — FENTANYL CITRATE 0.05 MG/ML IJ SOLN
INTRAMUSCULAR | Status: AC
  Filled 2012-01-17: qty 2

## 2012-01-17 MED ORDER — SODIUM CHLORIDE 0.9 % IV SOLN
250.0000 mL | INTRAVENOUS | Status: DC
  Administered 2012-01-20: 15 mL via INTRAVENOUS

## 2012-01-17 NOTE — CV Procedure (Addendum)
Denise,Elizabeht L Female, 78 y.o., [birth year]  Location: [facility]  Bed: NONE  MRN: [id number]  CSN: [id number]  Proc Dt: 01/17/12   Reason for procedure: Dyspnea and chest pain, suspected pericardial tamponade  Procedure performed: Right heart catheterization  The patient is an 78 year old woman with recurrent moderate pericardial effusion and hypoprothrombinemia roughly 2 weeks status post radiofrequency ablation for atrial fibrillation complicated by perforation and pericardial effusion. An echocardiogram performed on August 30 did not show any residual pericardial effusion but echocardiography today shows a moderate-sized perfusion with equivocal findings for pericardial tamponade.  Although initially in normal sinus rhythm the patient's rhythm converted to a total fibrillation while she was being prepped for the procedure.  Procedure in detail: The risks and benefits of the procedure discussed with patient and her family informed consent was obtained. The right coronary was prepped and draped in usual sterile fashion. Local anesthesia with 1% lidocaine was administered. Using a single stick a venipuncture was easily performed in the right common femoral vein and a 7 French sheath was placed using a modified Seldinger technique.  Under fluoroscopic guidance and using a 7 French balloon-tip Swan-Ganz catheter pressures recorded in the right atrium, right ventricle, main pulmonary artery and in the pulmonary artery wedge position. A single sample of left oxygen saturation was obtained from the main pulmonary artery.  After the procedure is performed the catheter was removed. The sheath was removed and hemostasis achieved with manual pressure. No immediate complications occurred.   Hemodynamic findings:  Systemic blood pressure 121/78 mmHg Pulmonary artery wedge pressure A wave 27, V wave 25, mean 20 mm Hg Pulmonary artery 44/17 ( mean 30 ) mm Hg Right ventricle 40/7  with an end-diastolic  pressure of 11 mm Hg Right atrium A-wave 17, V wave 18, mean 13 mm Hg PA oxygen saturation 61% (systemic saturation 96% by pulse oximetry and presents Cardiac output is 4.2L per minute (cardiac index 2.5 L per minute per meter sq)  Impression and plan:  The findings are more consistent with moderate left heart failure than with pericardial tamponade. The patient has preserved left ventricular systolic function. There is a mild pulmonary arterial hypertension. The findings are consistent with pseudonormalization of the mitral inflow on echocardiography and elevation in Pro B. Natruretic peptide on lab test.  Pericardiocentesis does not appear to be immediately necessary and would be a risky endeavor since the patient's anticoagulation is not yet fully reversed.  The patient's subjective complaints may be a reflection of acute pericarditis. For now we'll simply hold anticoagulation. May have to consider treatment with steroids or colchicine. Followup echocardiography in the morning. It is possible that her pericarditis is a Dressler's type phenomenon following the recent pericardiotomy. Consider treatment with steroids and/or colchicine.   At this point in time the risks of anticoagulation appear to exceed the risk of systemic embolism secondary to atrial fibrillation and recent ablation. The exact timing to safely restart anticoagulation is unclear at the moment.   Thurmon Fair, MD, [facility] [phone] office [phone] pager 01/17/2012 4:32 PM

## 2012-01-17 NOTE — Care Management Note (Addendum)
    Page 1 of 1   01/24/2012     1:41:30 PM   CARE MANAGEMENT NOTE 01/24/2012  Patient:  [name], [name]   Account Number:  [id number]  Date Initiated:  01/17/2012  Documentation initiated by:  Junius Creamer  Subjective/Objective Assessment:   adm w cardiac tamponade     Action/Plan:   lives w husband, pcp dr Nicholos Johns   Anticipated DC Date:  01/27/2012   Anticipated DC Plan:  HOME/SELF CARE      DC Planning Services  CM consult  Medication Assistance              Status of service:  Completed, signed off    Discharge Disposition:  HOME/SELF CARE  Per UR Regulation:  Reviewed for med. necessity/level of care/duration of stay  Comments:  01/24/12, Kathi Der RNC-MNN, BSN, [phone], CM received referral.  Pt has insurance coverage and co pay will be $35 for medication . 9/6 10:30a debbie Lennin Osmond rn,bns [phone]

## 2012-01-17 NOTE — Progress Notes (Signed)
Subjective:  Still SOB Had recurrent AF with controlled ventricular rate on this AM ECG, but now back in NSR.  Objective:  Vital Signs in the last 24 hours: Temp:  [97.6 F (36.4 C)-97.7 F (36.5 C)] 97.7 F (36.5 C) (09/06 0400) Pulse Rate:  [55-144] 101  (09/06 0700) Resp:  [15-34] 25  (09/06 0700) BP: (90-162)/(27-107) 90/64 mmHg (09/06 0700) SpO2:  [94 %-100 %] 97 % (09/06 0700) Weight:  [69.7 kg (153 lb 10.6 oz)] 69.7 kg (153 lb 10.6 oz) (09/06 0000)  Intake/Output from previous day:  Intake/Output Summary (Last 24 hours) at 01/17/12 0808 Last data filed at 01/17/12 0200  Gross per 24 hour  Intake    110 ml  Output    550 ml  Net   -440 ml    Physical Exam: General appearance: alert, cooperative and mild distress Lungs: clear to auscultation bilaterally Heart: regular rate and rhythm and positive rub Extrem: !+ LE edema Neck: JVD   Rate: 70  Rhythm: normal sinus rhythm now, PAF with RBBB earlier  Lab Results:  Basename 01/16/12 2035 01/16/12 2018  WBC -- 13.2*  HGB 11.6* 11.0*  PLT -- 316    Basename 01/16/12 2035 01/16/12 2018  NA 140 136  K 4.4 4.8  CL 106 101  CO2 -- 24  GLUCOSE 106* 109*  BUN 24* 23  CREATININE 1.50* 1.34*   No results found for this basename: TROPONINI:2,CK,MB:2 in the last 72 hours Hepatic Function Panel No results found for this basename: PROT,ALBUMIN,AST,ALT,ALKPHOS,BILITOT,BILIDIR,IBILI in the last 72 hours No results found for this basename: CHOL in the last 72 hours  Basename 01/17/12 0510  INR 2.62*    Imaging: Dg Chest 2 View  01/16/2012  *RADIOLOGY REPORT*  Clinical Data: Right chest pain with severe shortness of breath for 2 days.  History of cardiac ablation for atrial fibrillation.  CHEST - 2 VIEW  Comparison: 09/03/2011 and 05/29/2011.  Findings: There is cardiomegaly with vascular congestion.  There are new small pleural effusions bilaterally with associated bibasilar atelectasis.  No overt edema or confluent  airspace opacity is seen.  Embolization coils in the upper abdomen are again noted.  IMPRESSION: New bilateral pleural effusions with associated bibasilar atelectasis.  No overt pulmonary edema.   Original Report Authenticated By: Gerrianne Scale, M.D.     Cardiac Studies:  Assessment/Plan:   Principal Problem:  *Cardiac tamponade, recurrent episode, admitted 9/5 Active Problems:  Supra theraputic INR 5.5 01/16/12  Right bundle branch block and left anterior fascicular block  Dyspnea on exertion, secondary to AF  PAF, hx of RFA at [facility] complicated by tamponade  Renal insufficiency, SCr up to 1.5   GERD  HTN (hypertension)  CAD, mild by cath 2001 and 09/04/11  COPD, by CXR, followed by Dr Maple Hudson   Plan- Dr Royann Shivers to see, possible pericardiocentesis this afternoon, to get Vit K. She is in and out of AF with RBBB on Tykosin.    Corine Shelter PA-C 01/17/2012, 8:08 AM   I have seen and examined the patient along with Corine Shelter PA-C.  I have reviewed the chart, notes and new data.  I agree with PA's note.  Key new complaints: still dyspneic. Respiratory rate was 36 per minute while asleep. Orthopneic  Key examination changes: pericardial rub heard; no hypotension; JVP elevated Key new findings / data: quick review of bedside echo shows echo signs of tamponade - RA and RV chamber collapse and plethoric IVC. Largest fluid pocket is inferior  to the heart. Best approach for pericardiocentesis appears to be subxiphoid. Bilateral pleural effusions also seen. INR still high at 2.6, but dropping.   PLAN: Will review full echo and repeat PT/INR this afternoon. Pericardiocentesis later today, preferably when INR<2.  Thurmon Fair, MD, [facility] [phone] 01/17/2012, 8:38 AM

## 2012-01-17 NOTE — Progress Notes (Signed)
  Echocardiogram 2D Echocardiogram has been performed.  Steph Cheadle 01/17/2012, 8:59 AM

## 2012-01-17 NOTE — Progress Notes (Signed)
The clinical picture is mostly consistent with acute pericarditis (likely hemorrhagic) and early tamponade, but the echo findings are equivocal. Will perform a right heart catheterization before deciding re: pericardiocentesis. The procedure, its risks and benefits were discussed with the patient and her family. This procedure has been fully reviewed with the patient and written informed consent has been obtained.

## 2012-01-17 NOTE — Progress Notes (Signed)
QTc greater than 500.  Called Dr.C.  He said that was her baseline and that it is ok to give Tikosyn.

## 2012-01-18 ENCOUNTER — Inpatient Hospital Stay (HOSPITAL_COMMUNITY): Payer: Medicare Other

## 2012-01-18 DIAGNOSIS — I309 Acute pericarditis, unspecified: Secondary | ICD-10-CM

## 2012-01-18 LAB — CK TOTAL AND CKMB (NOT AT ARMC)
CK, MB: 1.3 ng/mL (ref 0.3–4.0)
Relative Index: INVALID (ref 0.0–2.5)
Total CK: 28 U/L (ref 7–177)

## 2012-01-18 LAB — CBC WITH DIFFERENTIAL/PLATELET
Basophils Absolute: 0 10*3/uL (ref 0.0–0.1)
Eosinophils Absolute: 0.1 10*3/uL (ref 0.0–0.7)
Eosinophils Relative: 1 % (ref 0–5)
MCH: 30.2 pg (ref 26.0–34.0)
MCHC: 32.7 g/dL (ref 30.0–36.0)
MCV: 92.4 fL (ref 78.0–100.0)
Platelets: 275 10*3/uL (ref 150–400)
RDW: 13 % (ref 11.5–15.5)

## 2012-01-18 LAB — POCT I-STAT 3, ART BLOOD GAS (G3+)
Acid-Base Excess: 1 mmol/L (ref 0.0–2.0)
Bicarbonate: 23.5 mEq/L (ref 20.0–24.0)
O2 Saturation: 99 %
Patient temperature: 98.6
TCO2: 24 mmol/L (ref 0–100)

## 2012-01-18 LAB — BASIC METABOLIC PANEL
BUN: 13 mg/dL (ref 6–23)
Calcium: 8.5 mg/dL (ref 8.4–10.5)
Creatinine, Ser: 0.81 mg/dL (ref 0.50–1.10)
GFR calc non Af Amer: 65 mL/min — ABNORMAL LOW (ref >90)
Glucose, Bld: 92 mg/dL (ref 70–99)
Sodium: 136 mEq/L (ref 135–145)

## 2012-01-18 LAB — PROTIME-INR
INR: 1.62 — ABNORMAL HIGH (ref 0.00–1.49)
Prothrombin Time: 19.5 seconds — ABNORMAL HIGH (ref 11.6–15.2)

## 2012-01-18 MED ORDER — FUROSEMIDE 10 MG/ML IJ SOLN
40.0000 mg | Freq: Two times a day (BID) | INTRAMUSCULAR | Status: AC
  Administered 2012-01-18 – 2012-01-19 (×4): 40 mg via INTRAVENOUS
  Filled 2012-01-18 (×2): qty 4

## 2012-01-18 MED ORDER — POTASSIUM CHLORIDE CRYS ER 20 MEQ PO TBCR
EXTENDED_RELEASE_TABLET | ORAL | Status: AC
  Filled 2012-01-18: qty 2

## 2012-01-18 MED ORDER — COLCHICINE 0.6 MG PO TABS
0.6000 mg | ORAL_TABLET | Freq: Two times a day (BID) | ORAL | Status: AC
  Administered 2012-01-18 (×2): 0.6 mg via ORAL
  Filled 2012-01-18 (×2): qty 1

## 2012-01-18 MED ORDER — POTASSIUM CHLORIDE CRYS ER 20 MEQ PO TBCR
EXTENDED_RELEASE_TABLET | ORAL | Status: AC
  Administered 2012-01-18: 20 meq
  Filled 2012-01-18: qty 2

## 2012-01-18 MED ORDER — POTASSIUM CHLORIDE CRYS ER 20 MEQ PO TBCR
40.0000 meq | EXTENDED_RELEASE_TABLET | ORAL | Status: AC
  Administered 2012-01-18 (×2): 40 meq via ORAL

## 2012-01-18 MED ORDER — COLCHICINE 0.6 MG PO TABS
0.6000 mg | ORAL_TABLET | Freq: Every day | ORAL | Status: DC
  Administered 2012-01-19 – 2012-01-24 (×6): 0.6 mg via ORAL
  Filled 2012-01-18 (×7): qty 1

## 2012-01-18 NOTE — Progress Notes (Signed)
  Echocardiogram 2D Echocardiogram has been performed.  [name] 01/18/2012, 11:59 AM

## 2012-01-18 NOTE — Progress Notes (Signed)
[facility]  DAILY PROGRESS NOTE   Subjective:  Continues to be dyspneic. She went into a-fib overnight. Remains on tikosyn and now on cardizem gtts. Ventricular rate is controlled.  Chest pressure is better when sitting upright.  Objective:  Temp:  [97.9 F (36.6 C)-98.4 F (36.9 C)] 98.4 F (36.9 C) (09/07 0400) Pulse Rate:  [52-125] 115  (09/07 0700) Resp:  [17-37] 27  (09/06 1800) BP: (99-143)/(43-125) 122/73 mmHg (09/07 0700) SpO2:  [93 %-99 %] 96 % (09/07 0700) Weight change:   Intake/Output from previous day: 09/06 0701 - 09/07 0700 In: 28.5 [I.V.:26.5; IV Piggyback:2] Out: 1650 [Urine:1650]  Intake/Output from this shift:    Medications: Current Facility-Administered Medications  Medication Dose Route Frequency Provider Last Rate Last Dose  . 0.9 %  sodium chloride infusion  250 mL Intravenous Continuous Mihai Croitoru, MD      . acetaminophen (TYLENOL) suppository 650 mg  650 mg Rectal Q6H PRN Mihai Croitoru, MD      . acetaminophen (TYLENOL) tablet 650 mg  650 mg Oral Q4H PRN Mihai Croitoru, MD      . atorvastatin (LIPITOR) tablet 10 mg  10 mg Oral q1800 Mihai Croitoru, MD   10 mg at 01/17/12 1900  . calcium carbonate (OS-CAL - dosed in mg of elemental calcium) tablet 500 mg of elemental calcium  1 tablet Oral BID WC Mihai Croitoru, MD   500 mg of elemental calcium at 01/17/12 1900  . diltiazem (CARDIZEM) 1 mg/mL load via infusion 20 mg  20 mg Intravenous Once Nada Boozer, NP   20 mg at 01/17/12 2230  . diltiazem (CARDIZEM) 100 mg in dextrose 5 % 100 mL infusion  5-15 mg/hr Intravenous Titrated Nada Boozer, NP 10 mL/hr at 01/18/12 0700 10 mg/hr at 01/18/12 0700  . dofetilide (TIKOSYN) capsule 250 mcg  250 mcg Oral BID Thurmon Fair, MD   250 mcg at 01/17/12 2252  . fentaNYL (SUBLIMAZE) 0.05 MG/ML injection           . furosemide (LASIX) injection 20 mg  20 mg Intravenous Once Thurmon Fair, MD   20 mg at 01/17/12 1804  . heparin 2-0.9  UNIT/ML-% infusion           . HYDROcodone-acetaminophen (NORCO/VICODIN) 5-325 MG per tablet 1-2 tablet  1-2 tablet Oral Q4H PRN Mihai Croitoru, MD      . lidocaine (XYLOCAINE) 1 % injection           . loteprednol (LOTEMAX) 0.5 % ophthalmic suspension 1 drop  1 drop Both Eyes BID Thurmon Fair, MD   1 drop at 01/17/12 2257  . magnesium oxide (MAG-OX) tablet 400 mg  400 mg Oral Daily Mihai Croitoru, MD   400 mg at 01/17/12 1000  . metoprolol tartrate (LOPRESSOR) tablet 12.5 mg  12.5 mg Oral BID Thurmon Fair, MD   12.5 mg at 01/17/12 2156  . morphine 2 MG/ML injection 2 mg  2 mg Intravenous Q1H PRN Thurmon Fair, MD   2 mg at 01/17/12 2207  . ondansetron (ZOFRAN) injection 4 mg  4 mg Intravenous Q6H PRN Mihai Croitoru, MD      . sodium chloride 0.9 % injection 3 mL  3 mL Intravenous Q12H Mihai Croitoru, MD   3 mL at 01/17/12 2255  . sodium chloride 0.9 % injection 3 mL  3 mL Intravenous Q12H Mihai Croitoru, MD   3 mL at 01/18/12 0015  . sodium chloride 0.9 % injection 3 mL  3  mL Intravenous PRN Mihai Croitoru, MD      . sucralfate (CARAFATE) tablet 1 g  1 g Oral BID WC Mihai Croitoru, MD   1 g at 01/17/12 1917  . zolpidem (AMBIEN) tablet 5 mg  5 mg Oral QHS PRN Thurmon Fair, MD      . DISCONTD: acetaminophen (TYLENOL) tablet 650 mg  650 mg Oral Q6H PRN Thurmon Fair, MD        Physical Exam: General appearance: alert and mild distress Neck: JVD - 3 cm above sternal notch, no adenopathy, no carotid bruit, supple, symmetrical, trachea midline and thyroid not enlarged, symmetric, no tenderness/mass/nodules Lungs: diminished breath sounds RLL Heart: irregularly irregular rhythm Abdomen: soft, non-tender; bowel sounds normal; no masses,  no organomegaly Extremities: extremities normal, atraumatic, no cyanosis or edema Pulses: 2+ and symmetric Skin: Skin color, texture, turgor normal. No rashes or lesions Neurologic: Grossly normal  Lab Results: Results for orders placed during the  hospital encounter of 01/16/12 (from the past 48 hour(s))  CBC     Status: Abnormal   Collection Time   01/16/12  8:18 PM      Component Value Range Comment   WBC 13.2 (*) 4.0 - 10.5 K/uL    RBC 3.59 (*) 3.87 - 5.11 MIL/uL    Hemoglobin 11.0 (*) 12.0 - 15.0 g/dL    HCT 16.1 (*) 09.6 - 46.0 %    MCV 93.0  78.0 - 100.0 fL    MCH 30.6  26.0 - 34.0 pg    MCHC 32.9  30.0 - 36.0 g/dL    RDW 04.5  40.9 - 81.1 %    Platelets 316  150 - 400 K/uL   BASIC METABOLIC PANEL     Status: Abnormal   Collection Time   01/16/12  8:18 PM      Component Value Range Comment   Sodium 136  135 - 145 mEq/L    Potassium 4.8  3.5 - 5.1 mEq/L    Chloride 101  96 - 112 mEq/L    CO2 24  19 - 32 mEq/L    Glucose, Bld 109 (*) 70 - 99 mg/dL    BUN 23  6 - 23 mg/dL    Creatinine, Ser 9.14 (*) 0.50 - 1.10 mg/dL    Calcium 9.1  8.4 - 78.2 mg/dL    GFR calc non Af Amer 35 (*) >90 mL/min    GFR calc Af Amer 41 (*) >90 mL/min   PRO B NATRIURETIC PEPTIDE     Status: Abnormal   Collection Time   01/16/12  8:18 PM      Component Value Range Comment   Pro B Natriuretic peptide (BNP) 2298.0 (*) 0 - 450 pg/mL   PROTIME-INR     Status: Abnormal   Collection Time   01/16/12  8:18 PM      Component Value Range Comment   Prothrombin Time 50.9 (*) 11.6 - 15.2 seconds REPEATED TO VERIFY   INR 5.52 (*) 0.00 - 1.49   POCT I-STAT, CHEM 8     Status: Abnormal   Collection Time   01/16/12  8:35 PM      Component Value Range Comment   Sodium 140  135 - 145 mEq/L    Potassium 4.4  3.5 - 5.1 mEq/L    Chloride 106  96 - 112 mEq/L    BUN 24 (*) 6 - 23 mg/dL    Creatinine, Ser 9.56 (*) 0.50 - 1.10 mg/dL  Glucose, Bld 106 (*) 70 - 99 mg/dL    Calcium, Ion 4.09 (*) 1.13 - 1.30 mmol/L    TCO2 23  0 - 100 mmol/L    Hemoglobin 11.6 (*) 12.0 - 15.0 g/dL    HCT 81.1 (*) 91.4 - 46.0 %   MRSA PCR SCREENING     Status: Normal   Collection Time   01/17/12  1:21 AM      Component Value Range Comment   MRSA by PCR NEGATIVE  NEGATIVE     PROTIME-INR     Status: Abnormal   Collection Time   01/17/12  5:10 AM      Component Value Range Comment   Prothrombin Time 28.4 (*) 11.6 - 15.2 seconds    INR 2.62 (*) 0.00 - 1.49   PROTIME-INR     Status: Abnormal   Collection Time   01/17/12  2:55 PM      Component Value Range Comment   Prothrombin Time 21.5 (*) 11.6 - 15.2 seconds    INR 1.83 (*) 0.00 - 1.49   POCT I-STAT 3, BLOOD GAS (G3P V)     Status: Abnormal   Collection Time   01/17/12  4:00 PM      Component Value Range Comment   pH, Ven 7.386 (*) 7.250 - 7.300    pCO2, Ven 33.3 (*) 45.0 - 50.0 mmHg    pO2, Ven 32.0  30.0 - 45.0 mmHg    Bicarbonate 19.9 (*) 20.0 - 24.0 mEq/L    TCO2 21  0 - 100 mmol/L    O2 Saturation 61.0      Acid-base deficit 4.0 (*) 0.0 - 2.0 mmol/L    Sample type VENOUS      Comment NOTIFIED PHYSICIAN     BASIC METABOLIC PANEL     Status: Abnormal   Collection Time   01/18/12  5:26 AM      Component Value Range Comment   Sodium 136  135 - 145 mEq/L    Potassium 3.5  3.5 - 5.1 mEq/L DELTA CHECK NOTED   Chloride 101  96 - 112 mEq/L    CO2 24  19 - 32 mEq/L    Glucose, Bld 92  70 - 99 mg/dL    BUN 13  6 - 23 mg/dL DELTA CHECK NOTED   Creatinine, Ser 0.81  0.50 - 1.10 mg/dL DELTA CHECK NOTED   Calcium 8.5  8.4 - 10.5 mg/dL    GFR calc non Af Amer 65 (*) >90 mL/min    GFR calc Af Amer 75 (*) >90 mL/min   SEDIMENTATION RATE     Status: Abnormal   Collection Time   01/18/12  5:26 AM      Component Value Range Comment   Sed Rate 88 (*) 0 - 22 mm/hr   CBC WITH DIFFERENTIAL     Status: Abnormal   Collection Time   01/18/12  5:26 AM      Component Value Range Comment   WBC 10.0  4.0 - 10.5 K/uL    RBC 3.44 (*) 3.87 - 5.11 MIL/uL    Hemoglobin 10.4 (*) 12.0 - 15.0 g/dL    HCT 78.2 (*) 95.6 - 46.0 %    MCV 92.4  78.0 - 100.0 fL    MCH 30.2  26.0 - 34.0 pg    MCHC 32.7  30.0 - 36.0 g/dL    RDW 21.3  08.6 - 57.8 %    Platelets 275  150 - 400  K/uL    Neutrophils Relative 72  43 - 77 %    Neutro Abs  7.1  1.7 - 7.7 K/uL    Lymphocytes Relative 13  12 - 46 %    Lymphs Abs 1.3  0.7 - 4.0 K/uL    Monocytes Relative 14 (*) 3 - 12 %    Monocytes Absolute 1.4 (*) 0.1 - 1.0 K/uL    Eosinophils Relative 1  0 - 5 %    Eosinophils Absolute 0.1  0.0 - 0.7 K/uL    Basophils Relative 0  0 - 1 %    Basophils Absolute 0.0  0.0 - 0.1 K/uL   PROTIME-INR     Status: Abnormal   Collection Time   01/18/12  5:26 AM      Component Value Range Comment   Prothrombin Time 19.5 (*) 11.6 - 15.2 seconds    INR 1.62 (*) 0.00 - 1.49   MAGNESIUM     Status: Normal   Collection Time   01/18/12  5:26 AM      Component Value Range Comment   Magnesium 2.1  1.5 - 2.5 mg/dL     Imaging: Dg Chest 2 View  01/16/2012  *RADIOLOGY REPORT*  Clinical Data: Right chest pain with severe shortness of breath for 2 days.  History of cardiac ablation for atrial fibrillation.  CHEST - 2 VIEW  Comparison: 09/03/2011 and 05/29/2011.  Findings: There is cardiomegaly with vascular congestion.  There are new small pleural effusions bilaterally with associated bibasilar atelectasis.  No overt edema or confluent airspace opacity is seen.  Embolization coils in the upper abdomen are again noted.  IMPRESSION: New bilateral pleural effusions with associated bibasilar atelectasis.  No overt pulmonary edema.   Original Report Authenticated By: Gerrianne Scale, M.D.     Assessment:  1. Principal Problem: 2.  *Cardiac tamponade, recurrent episode, admitted 9/5 3. Active Problems: 4.  GERD 5.  Right bundle branch block and left anterior fascicular block 6.  Supra theraputic INR 5.5 01/16/12 7.  HTN (hypertension) 8.  Dyspnea on exertion, secondary to AF 9.  CAD, mild by cath 2001 and 09/04/11 10.  COPD, by CXR, followed by Dr Maple Hudson 11.  PAF, hx of RFA at [facility] complicated by tamponade 12.  Renal insufficiency, SCr up to 1.5  13.   Plan:  1. She remains dyspneic and is in a-fib today. Rate controlled on diltiazem. On tikosyn 250 mcg BID -  QTC 511 today (508 yesterday). Bilateral small pleural effusions on CXR, BNP 2298.  ESR 88.  Symptoms somewhat better sitting up, suggesting clinical pericarditis along with pericardial effusion. I think she will benefit from colchicine for symptoms relief and I will start that today. RHC yesterday shows elevated PCWP of 20, RA of 13 and low normal cardiac output. Will attempt additional diuresis today. Repeat echocardiogram is pending to follow changes in the size of her effusion. It is more likely her effusion is related to recent ablation and could represent recurrent bleeding from microperforation. She may need OR drainage with CT surgery and if necessary they may be able to repair any perforation that could be found.    Time Spent Directly with Patient:  30 minutes  Length of Stay:  LOS: 2 days   Chrystie Nose, MD, [facility] Attending Cardiologist [facility]  Marin Milley C 01/18/2012, 8:36 AM

## 2012-01-18 NOTE — Consult Note (Signed)
T. CTS consult--pericardial effusion 2 weeks after ablation procedure for atrial fibrillation  78 year old female admitted to the hospital with shortness of breath weakness and was found by 2-D echo have a small posterior pericardial effusion measuring 1.3 cm. Her right arm is prolonged at 4.0 and her Coumadin was held. She underwent a right heart cath which showed no evidence of cardiac tamponadet, no normalization of filling pressures with cardiac output of 4 L  She states or shortness of breath is been chronic and she does have some COPD. Chest x-ray shows evidence of emphysema without edema or infiltrate  I reviewed the 2-D echocardiogram she received as well as the results of her right heart cath.  Physical exam   Elderly Caucasian female breathing comfortable in bed on 2 L nasal cannula in sinus rhythm blood pressure 110/70 heart rate 78 per minute sinus oxygen saturation 95%  Neck without JVD or mass Breath sounds distant Cardiac rhythm regular grade 2/6 systolic murmur of aortic sclerosis Abdomen soft nontender without edema Extremities warm well perfused without edema or tenderness Neurologic alert and oriented without-focal deficit  Impression Without significant effects of small pericardial effusion-  would not recommend subxiphoid window at this time. Would proceed with CT scan of the chest with contrast to rule out pulmonary disease, pulmonary-vascular disease. Would keep the INR low in the range of 1.5-2.0 with her probable post procedure pericarditis and small effusion. Will followup results of CT scan.

## 2012-01-18 NOTE — Progress Notes (Addendum)
Called to see the patient about progressive respiratory difficulty. She feels weak and more dyspneic. Exam is notable for decreased breath sounds but no rales.  She actually looks weaker and more dyspneic. History of COPD. She appears to have converted to sinus rhythm.  I reviewed her repeat echo and there is no change or perhaps a slight decrease in her effusion. The IVC is smaller and collapses, however, there is a small amount of RA and RV collapse. The RV appears hypokinetic .Marland Kitchen She has diuresed with lasix today, ?RV failure .Marland Kitchen She is not hypotensive and does not have signs of clinical tamponade. I'm not convinced she needs emergent pericardial drainage. Colchicine started. Will check STAT CXR, ABG and as RT to evalulate for nebulizers.  Chrystie Nose, MD, [facility] Attending Cardiologist [facility]

## 2012-01-19 ENCOUNTER — Inpatient Hospital Stay (HOSPITAL_COMMUNITY): Payer: Medicare Other

## 2012-01-19 ENCOUNTER — Encounter (HOSPITAL_COMMUNITY): Payer: Self-pay | Admitting: Radiology

## 2012-01-19 LAB — BASIC METABOLIC PANEL
BUN: 17 mg/dL (ref 6–23)
CO2: 24 mEq/L (ref 19–32)
Calcium: 8.7 mg/dL (ref 8.4–10.5)
Chloride: 101 mEq/L (ref 96–112)
Creatinine, Ser: 0.81 mg/dL (ref 0.50–1.10)
GFR calc Af Amer: 75 mL/min — ABNORMAL LOW (ref >90)
GFR calc non Af Amer: 65 mL/min — ABNORMAL LOW (ref >90)
Glucose, Bld: 139 mg/dL — ABNORMAL HIGH (ref 70–99)
Potassium: 3.9 mEq/L (ref 3.5–5.1)
Sodium: 135 mEq/L (ref 135–145)

## 2012-01-19 LAB — PROTIME-INR
INR: 1.67 — ABNORMAL HIGH (ref 0.00–1.49)
Prothrombin Time: 20 seconds — ABNORMAL HIGH (ref 11.6–15.2)

## 2012-01-19 MED ORDER — IOHEXOL 350 MG/ML SOLN
100.0000 mL | Freq: Once | INTRAVENOUS | Status: AC | PRN
  Administered 2012-01-19: 100 mL via INTRAVENOUS

## 2012-01-19 MED ORDER — DILTIAZEM LOAD VIA INFUSION
20.0000 mg | Freq: Once | INTRAVENOUS | Status: DC
  Filled 2012-01-19: qty 20

## 2012-01-19 MED ORDER — DILTIAZEM HCL 100 MG IV SOLR
5.0000 mg/h | INTRAVENOUS | Status: DC
  Administered 2012-01-20: 5 mg/h via INTRAVENOUS
  Administered 2012-01-20: 10 mg/h via INTRAVENOUS
  Filled 2012-01-19: qty 100

## 2012-01-19 NOTE — Progress Notes (Signed)
Called about patient back in a-fib with RVR. Will restart cardizem gtts. Based on QTC, may need to increase tikosyn dose in the am as she continues to have paroxysmal a-fib. Oxygen to keep SPO2 >92%.  Chrystie Nose, MD, [facility] Attending Cardiologist [facility]

## 2012-01-19 NOTE — Progress Notes (Signed)
Pt. Seen and examined. Agree with the NP/PA-C note as written.  She is feeling a little better today. She is back in sinus (had a short period of a-fib overnight). QTc is stable on tikosyn at 479 msec today. Chest CT shows heavy mitral annular calcification, however, echo does not show elevated gradients of mitral stenosis. The RV appears dilated and is hypokinetic on echo, suggesting right heart failure. Bilateral pleural effusions and a "small" pericardial effusion was noted. No PE was seen. She is about 3L negative over the last 2 days and I will continue diuresis again today. On colchicine, recheck ESR tomorrow. Can probably d/c cardizem gtts, she is on po lopressor.   Chrystie Nose, MD, [facility] Attending Cardiologist [facility]

## 2012-01-19 NOTE — Progress Notes (Signed)
Pt remain in SR with rate in the 70s in and out of bed to use the bedside commode.

## 2012-01-19 NOTE — Progress Notes (Signed)
Subjective: A fib, then SR, In and out of A. Fib, sitting on side of bed, states she feels better this AM. Still With SOB but able to take deeper breaths  Objective: Vital signs in last 24 hours: Temp:  [97.6 F (36.4 C)-98.8 F (37.1 C)] 97.9 F (36.6 C) (09/07 2339) Pulse Rate:  [62-111] 65  (09/07 2200) BP: (92-120)/(54-91) 101/62 mmHg (09/08 0600) SpO2:  [93 %-100 %] 93 % (09/08 0600) Weight change:  Last BM Date: 01/15/12 Intake/Output from previous day: -1416  Wt not recorded 09/07 0701 - 09/08 0700 In: 344 [P.O.:60; I.V.:280; IV Piggyback:4] Out: 1775 [Urine:1775] Intake/Output this shift: Total I/O In: -  Out: 300 [Urine:300]  PE: General:alert and oriented Heart:S1S2 RRR Lungs:few crackles in the bases Abd:+ BS, soft non tender Ext:no edema Neuro:alert and oriented X 3, MAE, follows commands   Lab Results:  [facility] 01/18/12 0526 01/16/12 2035 01/16/12 2018  WBC 10.0 -- 13.2*  HGB 10.4* 11.6* --  HCT 31.8* 34.0* --  PLT 275 -- 316   BMET  Basename 01/19/12 0520 01/18/12 0526  NA 135 136  K 3.9 3.5  CL 101 101  CO2 24 24  GLUCOSE 139* 92  BUN 17 13  CREATININE 0.81 0.81  CALCIUM 8.7 8.5    Basename 01/19/12 0520 01/18/12 1620  TROPONINI <0.30 <0.30    Lab Results  Component Value Date   CHOL  Value: 89        ATP III CLASSIFICATION:  <200     mg/dL   Desirable  213-086  mg/dL   Borderline High  >=578    mg/dL   High        08/17/9627   HDL 39* 06/21/2008   LDLCALC  Value: 33        Total Cholesterol/HDL:CHD Risk Coronary Heart Disease Risk Table                     Men   Women  1/2 Average Risk   3.4   3.3  Average Risk       5.0   4.4  2 X Average Risk   9.6   7.1  3 X Average Risk  23.4   11.0        Use the calculated Patient Ratio above and the CHD Risk Table to determine the patient's CHD Risk.        ATP III CLASSIFICATION (LDL):  <100     mg/dL   Optimal  528-413  mg/dL   Near or Above                    Optimal  130-159  mg/dL   Borderline   244-010  mg/dL   High  >272     mg/dL   Very High 09/13/6642   TRIG 86 06/21/2008   CHOLHDL 2.3 06/21/2008   Lab Results  Component Value Date   HGBA1C  Value: 5.4 (NOTE)                                                                       According to the ADA Clinical Practice Recommendations for 2011, when HbA1c is used as a screening test:   >=6.5%  Diagnostic of Diabetes Mellitus           (if abnormal result  is confirmed)  5.7-6.4%   Increased risk of developing Diabetes Mellitus  References:Diagnosis and Classification of Diabetes Mellitus,Diabetes Care,2011,34(Suppl 1):S62-S69 and Standards of Medical Care in         Diabetes - 2011,Diabetes Care,2011,34  (Suppl 1):S11-S61. 10/18/2009     Lab Results  Component Value Date   TSH 2.278 01/05/2012      EKG: Orders placed during the hospital encounter of 01/16/12  . EKG 12-LEAD  . EKG 12-LEAD  . ED EKG  . ED EKG  . EKG 12-LEAD  . EKG 12-LEAD  . EKG 12-LEAD  . EKG 12-LEAD  . EKG 12-LEAD  . EKG  . EKG 12-LEAD  . EKG 12-LEAD  . EKG 12-LEAD    Studies/Results: Ct Angio Chest Pe W/cm &/or Wo Cm  01/19/2012  *RADIOLOGY REPORT*  Clinical Data: Shortness of breath.  History of renal cancer and melanoma.  CT ANGIOGRAPHY CHEST  Technique:  Multidetector CT imaging of the chest using the standard protocol during bolus administration of intravenous contrast. Multiplanar reconstructed images including MIPs were obtained and reviewed to evaluate the vascular anatomy.  Contrast: OMNIPAQUE IOHEXOL 350 MG/ML SOLN  Comparison: 10/18/2009  Findings: Technically adequate study with good opacification of the central and segmental pulmonary arteries.  No focal filling defects demonstrated.  No evidence of significant pulmonary embolus.  Cardiac enlargement with particular dilatation of the right heart and left ventricle.  Calcification of the mitral valve annulus. Changes may represent mitral stenosis.  Coronary artery calcification.  Normal caliber  thoracic aorta with calcification. Small pericardial effusion.  Small bilateral pleural effusions. Basilar atelectasis.  No pneumothorax.  Airways appear patent. Esophagus is decompressed.  No significant lymphadenopathy in the chest.  There is a prominent lymph node anterior to the right mainstem bronchus which is measuring about 10 mm but is stable since previous study.  Visualized portions of the upper abdominal organs demonstrate metallic artifact in the liver.  Passive congestion of the liver.  Degenerative changes in the spine.  IMPRESSION: No evidence of significant pulmonary embolus.  Cardiac enlargement with configuration suggesting mitral stenosis.  Pericardial and bilateral pleural effusions.  Bilateral basilar atelectasis.   Original Report Authenticated By: Marlon Pel, M.D.    Dg Chest Port 1 View  01/18/2012  *RADIOLOGY REPORT*  Clinical Data: Dyspnea.  PORTABLE CHEST - 1 VIEW  Comparison: 01/16/2012.  Findings: The heart is mildly enlarged but stable.  There is tortuosity and calcification of the thoracic aorta.  There are small bilateral pleural effusions and bibasilar atelectasis.  No edema or definite infiltrates.  IMPRESSION: Persistent small effusions and bibasilar atelectasis.   Original Report Authenticated By: P. Loralie Champagne, M.D.    2D Echo: 01/18/12 Procedure narrative: Limited study - Left ventricle: The cavity size was normal. There was mild concentric hypertrophy. Systolic function was normal. The estimated ejection fraction was in the range of 55% to 60%. - Right ventricle: Partially collapsed and hypokinetic. Mild RV collapse is noted. - Pulmonary arteries: PA peak pressure: 33mm Hg (S). - Pericardium, extracardiac: Mild to moderate sized mostly posterior pericardial effusion. Features were not consistent with tamponade physiology. - Impressions: Compared to the most recent study, there has been a small decrease in the posterior pericadial fluid collection.  There are some echocardiographic features of right heart failure, but no clear signs of tamponade physiology. The IVC appears smaller and collapses with inspiration.  Impressions:  - Compared to the most recent study, there has been a small decrease in the posterior pericadial fluid collection. There are some echocardiographic features of right heart failure, but no clear signs of tamponade physiology. The IVC appears smaller and collapses with inspiration.    Medications: I have reviewed the patient's current medications.    Marland Kitchen atorvastatin  10 mg Oral q1800  . calcium carbonate  1 tablet Oral BID WC  . colchicine  0.6 mg Oral BID  . colchicine  0.6 mg Oral Daily  . dofetilide  250 mcg Oral BID  . furosemide  40 mg Intravenous BID  . loteprednol  1 drop Both Eyes BID  . magnesium oxide  400 mg Oral Daily  . metoprolol tartrate  12.5 mg Oral BID  . potassium chloride SA      . potassium chloride SA      . potassium chloride  40 mEq Oral Q1H  . sodium chloride  3 mL Intravenous Q12H  . sodium chloride  3 mL Intravenous Q12H  . sucralfate  1 g Oral BID WC   Assessment/Plan: Principal Problem:  *Cardiac tamponade, recurrent episode, admitted 9/5 Active Problems:  GERD  Right bundle branch block and left anterior fascicular block  Supra theraputic INR 5.5 01/16/12  HTN (hypertension)  Dyspnea on exertion, secondary to AF  CAD, mild by cath 2001 and 09/04/11  COPD, by CXR, followed by Dr Maple Hudson  PAF, hx of RFA at [facility] complicated by tamponade  Renal insufficiency, SCr up to 1.5   PLAN: See Dr. Zenaida Niece Trigt's note. In afib. CT of chest no PE. - MI.    INR 1.67 Dilt drip at 10 mg/hr.  Qtc 479 ms on EKG- on Tikosyn,on Lasix today 40 mg IV BID.   LOS: 3 days   Mickelle Goupil R 01/19/2012, 8:17 AM

## 2012-01-19 NOTE — Progress Notes (Addendum)
Pt in and out of Afib during the day. Remained in Afib since beginning of shift , HR consistently in the high 140s,150s, BP 117/99 associated with general ill feeling and increased difficulty breathing.Scheduled tikosyn and metoprolol given.Pt continue to c/o ill feeling and difficulty breathing. Informed Dr. Rennis Golden. Few minutes after getting over the phone with Dr. Rennis Golden, pt converted to NSR on monitor. 12 lead ECG done, confirmed NSR with RBBB rate in the 70s. Currently denies any  ill-feeling and difficulty breathing.

## 2012-01-20 DIAGNOSIS — I319 Disease of pericardium, unspecified: Secondary | ICD-10-CM | POA: Diagnosis present

## 2012-01-20 DIAGNOSIS — I509 Heart failure, unspecified: Secondary | ICD-10-CM | POA: Diagnosis present

## 2012-01-20 HISTORY — DX: Disease of pericardium, unspecified: I31.9

## 2012-01-20 LAB — BASIC METABOLIC PANEL
BUN: 16 mg/dL (ref 6–23)
CO2: 27 mEq/L (ref 19–32)
Calcium: 9.1 mg/dL (ref 8.4–10.5)
Chloride: 97 mEq/L (ref 96–112)
Creatinine, Ser: 0.83 mg/dL (ref 0.50–1.10)
GFR calc Af Amer: 73 mL/min — ABNORMAL LOW (ref >90)
GFR calc non Af Amer: 63 mL/min — ABNORMAL LOW (ref >90)
Glucose, Bld: 88 mg/dL (ref 70–99)
Potassium: 3.5 mEq/L (ref 3.5–5.1)
Sodium: 135 mEq/L (ref 135–145)

## 2012-01-20 LAB — PROTIME-INR
INR: 1.65 — ABNORMAL HIGH (ref 0.00–1.49)
Prothrombin Time: 19.8 seconds — ABNORMAL HIGH (ref 11.6–15.2)

## 2012-01-20 MED ORDER — WARFARIN - PHARMACIST DOSING INPATIENT: Freq: Every day | Status: DC

## 2012-01-20 MED ORDER — WARFARIN SODIUM 2 MG PO TABS
2.0000 mg | ORAL_TABLET | Freq: Once | ORAL | Status: AC
  Administered 2012-01-20: 2 mg via ORAL
  Filled 2012-01-20: qty 1

## 2012-01-20 MED ORDER — POTASSIUM CHLORIDE CRYS ER 20 MEQ PO TBCR
40.0000 meq | EXTENDED_RELEASE_TABLET | Freq: Once | ORAL | Status: AC
  Administered 2012-01-20: 40 meq via ORAL
  Filled 2012-01-20: qty 2

## 2012-01-20 MED ORDER — DILTIAZEM HCL ER COATED BEADS 120 MG PO CP24
120.0000 mg | ORAL_CAPSULE | Freq: Every day | ORAL | Status: DC
  Administered 2012-01-20 – 2012-01-24 (×5): 120 mg via ORAL
  Filled 2012-01-20 (×5): qty 1

## 2012-01-20 NOTE — Progress Notes (Signed)
Pt converted back to afib with rate in the 110s. Cardizem gtt started per previous orders. No bolus given.

## 2012-01-20 NOTE — Progress Notes (Signed)
Subjective:  Yesterday she "felt great"- she had PAF during the night which she notices. No chest pain, no increased SOB this am.  Objective:  Vital Signs in the last 24 hours: Temp:  [97.5 F (36.4 C)-97.9 F (36.6 C)] 97.5 F (36.4 C) (09/09 0800) Pulse Rate:  [136] 136  (09/08 2110) Resp:  [16] 16  (09/09 0800) BP: (98-127)/(48-99) 113/54 mmHg (09/09 0900) SpO2:  [81 %-100 %] 95 % (09/09 0900) Weight:  [66.4 kg (146 lb 6.2 oz)] 66.4 kg (146 lb 6.2 oz) (09/09 0500)  Intake/Output from previous day:  Intake/Output Summary (Last 24 hours) at 01/20/12 0909 Last data filed at 01/20/12 0817  Gross per 24 hour  Intake    774 ml  Output   2500 ml  Net  -1726 ml    Physical Exam: General appearance: alert, cooperative and no distress Lungs: clear to auscultation bilaterally Heart: regular rate and rhythm and no rub heard   Rate: 78  Rhythm: normal sinus rhythm  Lab Results:  Basename 01/18/12 0526  WBC 10.0  HGB 10.4*  PLT 275    Basename 01/20/12 0530 01/19/12 0520  NA 135 135  K 3.5 3.9  CL 97 101  CO2 27 24  GLUCOSE 88 139*  BUN 16 17  CREATININE 0.83 0.81    Basename 01/19/12 0520 01/18/12 1620  TROPONINI <0.30 <0.30   Hepatic Function Panel No results found for this basename: PROT,ALBUMIN,AST,ALT,ALKPHOS,BILITOT,BILIDIR,IBILI in the last 72 hours No results found for this basename: CHOL in the last 72 hours  Basename 01/20/12 0530  INR 1.65*    Imaging: Imaging results have been reviewed  Cardiac Studies:  Assessment/Plan:   Principal Problem:  *Cardiac tamponade, recurrent episode, admitted 9/5 Active Problems:  Supra theraputic INR 5.5 01/16/12  Chest pain of pericarditis  Acute CHF, presumed secondary to Rt heart failure  Right bundle branch block and left anterior fascicular block  Nl coronaries 2001, low risk Myoview 2012  Dyspnea on exertion, secondary to AF  PAF, hx of RFA at [facility] complicated by tamponade  Renal insufficiency, SCr up  to 1.5   GERD  HTN (hypertension)  RBBB, intermittent  CAD, mild by cath 2001 and 09/04/11  COPD, by CXR, followed by Dr Maple Hudson  Plan- She did diurese with Lasix X 4 doses. Her BNP is still 1368. ? Rt heart failure. Consider adding a daily dose of lasix. Transfer to step down. Cardizem IV restarted for recurrent PAF. Check QTc this am in NSR. Will discuss steroids with MD before ordering. Replace K+.    Corine Shelter PA-C 01/20/2012, 9:09 AM    I have seen and examined the patient along with Corine Shelter PA-C.  I have reviewed the chart, notes and new data.  I agree with PA's note.  She feels much better. She denies any dyspnea or chest discomfort this morning. She is back in normal sinus rhythm.   It seems has improved primarily with treatment of congestive heart failure using diuretics but also possibly due to the anti-inflammatory effect of colchicine. The white blood cell count has diminished. The BNP has improved substantially and is actually lower than the level tested in April.  PLAN: Discontinue intravenous diltiazem. Changed to by mouth. Additional potassium chloride (keep potassium around 4.0 due to dofetilide therapy) Resume warfarin target INR 2.0-2.5. Physical therapy evaluation. Transfer to telemetry. Possible discharge in the morning. Repeat outpatient Limited echo for pericardial effusion in 10-14 days.   Thurmon Fair, MD, [facility] [phone] 01/20/2012, 11:23 AM

## 2012-01-20 NOTE — Progress Notes (Addendum)
ANTICOAGULATION CONSULT NOTE - Initial Consult  Pharmacy Consult for Coumadin  Indication: paroxysmal A.fib   Allergies  Allergen Reactions  . Protonix (Pantoprazole Sodium) Other (See Comments)    Abdominal pain    Patient Measurements: Height: 5\' 2"  (157.5 cm) Weight: 146 lb 6.2 oz (66.4 kg) IBW/kg (Calculated) : 50.1   Vital Signs: Temp: 97.5 F (36.4 C) (09/09 0800) Temp src: Axillary (09/09 0800) BP: 105/55 mmHg (09/09 1100)  Labs:  Basename 01/20/12 0530 01/19/12 0520 01/18/12 1620 01/18/12 0526  HGB -- -- -- 10.4*  HCT -- -- -- 31.8*  PLT -- -- -- 275  APTT -- -- -- --  LABPROT 19.8* 20.0* -- 19.5*  INR 1.65* 1.67* -- 1.62*  HEPARINUNFRC -- -- -- --  CREATININE 0.83 0.81 -- 0.81  CKTOTAL -- -- 28 --  CKMB -- -- 1.3 --  TROPONINI -- <0.30 <0.30 --    Estimated Creatinine Clearance: 45.1 ml/min (by C-G formula based on Cr of 0.83).   Medical History: Past Medical History  Diagnosis Date  . Personal history of colonic polyps   . Diverticulosis of colon (without mention of hemorrhage)   . COPD (chronic obstructive pulmonary disease)   . Dysfunction of eustachian tube   . Persistent atrial fibrillation   . Allergic rhinitis   . Fibromuscular dysplasia   . 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 HTN (hypertension)   . Hyperlipidemia   . Cancer     melanoma in eye  . Blood transfusion   . GERD (gastroesophageal reflux disease)   . Arthritis   . Hiatal hernia   . Abdominal pain     due to Sequential arterial mediolysis.   [name] PAF (paroxysmal atrial fibrillation), after a. fib ablation at [facility], now with RVR 01/05/2012    Assessment: 86 yof admitted on 9/5 for acute pericardial tamponade. Patient w/ hx of PAF--INR upon admit was 5.52. Vitamin K was administered and INR down to 1.65 today. Coumadin to restart today with a goal INR of 2.0-2.5. CBC stable. No report of bleeding   PTA Coumadin dose 2.5mg  po  daily   Goal of Therapy:  2.0-2.5 Monitor platelets by anticoagulation protocol: Yes   Plan:  1) Coumadin 2mg  po x 1 dose tonight 2) Will check INR in am  3) Monitor for signs of bleeding   Thank you, Franchot Erichsen, Pharm.D. Clinical Pharmacist   Pager: [phone] 01/20/2012 1:21 PM

## 2012-01-21 LAB — BASIC METABOLIC PANEL
BUN: 18 mg/dL (ref 6–23)
CO2: 20 mEq/L (ref 19–32)
Calcium: 9.4 mg/dL (ref 8.4–10.5)
Chloride: 101 mEq/L (ref 96–112)
Creatinine, Ser: 0.68 mg/dL (ref 0.50–1.10)
GFR calc Af Amer: >90 mL/min (ref >90)
GFR calc non Af Amer: 78 mL/min — ABNORMAL LOW (ref >90)
Glucose, Bld: 94 mg/dL (ref 70–99)
Potassium: 5.5 mEq/L — ABNORMAL HIGH (ref 3.5–5.1)
Sodium: 135 mEq/L (ref 135–145)

## 2012-01-21 LAB — PROTIME-INR
INR: 1.95 — ABNORMAL HIGH (ref 0.00–1.49)
Prothrombin Time: 22.6 seconds — ABNORMAL HIGH (ref 11.6–15.2)

## 2012-01-21 MED ORDER — WARFARIN SODIUM 1 MG PO TABS
1.5000 mg | ORAL_TABLET | Freq: Once | ORAL | Status: AC
  Administered 2012-01-21: 1.5 mg via ORAL
  Filled 2012-01-21: qty 1

## 2012-01-21 MED ORDER — LOPERAMIDE HCL 2 MG PO CAPS: 2.0000 mg | ORAL_CAPSULE | ORAL | Status: DC | PRN

## 2012-01-21 NOTE — Progress Notes (Signed)
Patient transferred from 2913 via bed to 4708. Patient oriented to room and call bell system. VSS. Educated patient on using spirometer x10 every hour to prevent any fluid in the lungs. Currently patient has no signs and symptoms of pain. Will continue to monitor to end of shift

## 2012-01-21 NOTE — Evaluation (Signed)
Physical Therapy Evaluation Patient Details Name: [name] MRN: [id number] DOB: [birth year] Today's Date: 01/21/2012 Time: 4098-1191 PT Time Calculation (min): 18 min  PT Assessment / Plan / Recommendation Clinical Impression  Pt is an 78 y/o female admitted with PAF and is modified independent with all mobility.  No PT needs identified at this time.  Recommend d/c home without PT follow-up.  Signing off.  Thanks!    PT Assessment  Patent does not need any further PT services    Follow Up Recommendations  No PT follow up    Barriers to Discharge        Equipment Recommendations  None recommended by PT    Recommendations for Other Services     Frequency      Precautions / Restrictions Precautions Precautions: None Restrictions Weight Bearing Restrictions: No   Pertinent Vitals/Pain None      Mobility  Bed Mobility Bed Mobility: Sit to Supine Sit to Supine: 6: Modified independent (Device/Increase time) Transfers Transfers: Sit to Stand;Stand to Sit Sit to Stand: 6: Modified independent (Device/Increase time) Stand to Sit: 6: Modified independent (Device/Increase time) Ambulation/Gait Ambulation/Gait Assistance: 5: Supervision Ambulation Distance (Feet): 450 Feet Assistive device: None Ambulation/Gait Assistance Details: Verbal cues for pursed lip breathing to help control dyspnea.  No unsteadiness noted. Gait Pattern: Step-through pattern Stairs: No Wheelchair Mobility Wheelchair Mobility: No    Exercises     PT Diagnosis:    PT Problem List:   PT Treatment Interventions:     PT Goals    Visit Information  Last PT Received On: 01/21/12 Assistance Needed: +1    Subjective Data  Subjective: "I may even be doing better." Patient Stated Goal: Go home.   Prior Functioning  Home Living Lives With: Spouse Available Help at Discharge: Family;Available 24 hours/day Type of Home: House Home Access: Stairs to enter [employer] of Steps:  1 Entrance Stairs-Rails: None Home Layout: One level (Also has a basement that pt will access. Has chair lift.) Bathroom Shower/Tub: Tub/shower unit;Curtain Dentist: None Prior Function Level of Independence: Independent Able to Take Stairs?: Yes Driving: Yes Vocation: Retired Musician: No difficulties    Cognition  Overall Cognitive Status: Appears within functional limits for tasks assessed/performed Arousal/Alertness: Awake/alert Orientation Level: Appears intact for tasks assessed Behavior During Session: [facility] for tasks performed    Extremity/Trunk Assessment Right Upper Extremity Assessment RUE ROM/Strength/Tone: Within functional levels RUE Sensation: WFL - Light Touch RUE Coordination: WFL - gross/fine motor Left Upper Extremity Assessment LUE ROM/Strength/Tone: Within functional levels LUE Sensation: WFL - Light Touch LUE Coordination: WFL - gross/fine motor Right Lower Extremity Assessment RLE ROM/Strength/Tone: Within functional levels (Has trouble with right knee, however.) RLE Sensation: WFL - Light Touch RLE Coordination: WFL - gross/fine motor Left Lower Extremity Assessment LLE ROM/Strength/Tone: Within functional levels LLE Sensation: WFL - Light Touch LLE Coordination: WFL - gross/fine motor Trunk Assessment Trunk Assessment: Normal   Balance Balance Balance Assessed: No  End of Session PT - End of Session Equipment Utilized During Treatment: Gait belt Activity Tolerance: Patient tolerated treatment well Patient left: in bed;with call bell/phone within reach Nurse Communication: Mobility status  GP     Cephus Shelling 01/21/2012, 9:53 AM  01/21/2012 Cephus Shelling, PT, DPT [phone]

## 2012-01-21 NOTE — Progress Notes (Signed)
ANTICOAGULATION CONSULT NOTE - Initial Consult  Pharmacy Consult for Coumadin  Indication: paroxysmal A.fib   Allergies  Allergen Reactions  . Protonix (Pantoprazole Sodium) Other (See Comments)    Abdominal pain    Patient Measurements: Height: 5\' 2"  (157.5 cm) Weight: 147 lb 14.9 oz (67.1 kg) IBW/kg (Calculated) : 50.1   Vital Signs: Temp: 98.3 F (36.8 C) (09/10 0800) Temp src: Oral (09/10 0800) BP: 131/72 mmHg (09/10 0800) Pulse Rate: 75  (09/10 0400)  Labs:  Basename 01/21/12 0400 01/20/12 0530 01/19/12 0520 01/18/12 1620  HGB -- -- -- --  HCT -- -- -- --  PLT -- -- -- --  APTT -- -- -- --  LABPROT 22.6* 19.8* 20.0* --  INR 1.95* 1.65* 1.67* --  HEPARINUNFRC -- -- -- --  CREATININE 0.68 0.83 0.81 --  CKTOTAL -- -- -- 28  CKMB -- -- -- 1.3  TROPONINI -- -- <0.30 <0.30    Estimated Creatinine Clearance: 47 ml/min (by C-G formula based on Cr of 0.68).   Medical History: Past Medical History  Diagnosis Date  . Personal history of colonic polyps   . Diverticulosis of colon (without mention of hemorrhage)   . COPD (chronic obstructive pulmonary disease)   . Dysfunction of eustachian tube   . Persistent atrial fibrillation   . Allergic rhinitis   . Fibromuscular dysplasia   . 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 HTN (hypertension)   . Hyperlipidemia   . Cancer     melanoma in eye  . Blood transfusion   . GERD (gastroesophageal reflux disease)   . Arthritis   . Hiatal hernia   . Abdominal pain     due to Sequential arterial mediolysis.   Marland Kitchen PAF (paroxysmal atrial fibrillation), after a. fib ablation at [facility], now with RVR 01/05/2012    Assessment: 86 yof admitted on 9/5 for acute pericardial tamponade. Patient w/ hx of PAF--INR upon admit was 5.52. Vitamin K was administered and INR dropped down to subtherapeutic range. INR today slightly subtherapeutic but increased to 1.95<1.65 after 2mg  dose last  night. Goal INR of 2.0-2.5. CBC stable. No report of bleeding   PTA Coumadin dose 2.5mg  po daily   Goal of Therapy:  2.0-2.5 Monitor platelets by anticoagulation protocol: Yes   Plan:  1) Coumadin 1.5mg  po x 1 dose tonight 2) Will check INR in am  3) Monitor for signs of bleeding   Thank you, Franchot Erichsen, Pharm.D. Clinical Pharmacist   Pager: [phone] 01/21/2012 8:36 AM

## 2012-01-21 NOTE — Progress Notes (Signed)
Subjective:  Had 3 loose stools yesterday, none this am.  Objective:  Vital Signs in the last 24 hours: Temp:  [98.1 F (36.7 C)-98.9 F (37.2 C)] 98.9 F (37.2 C) (09/10 0400) Pulse Rate:  [73-75] 75  (09/10 0400) Resp:  [18] 18  (09/10 0400) BP: (105-145)/(53-85) 134/62 mmHg (09/10 0500) SpO2:  [92 %-99 %] 95 % (09/10 0400) Weight:  [67.1 kg (147 lb 14.9 oz)] 67.1 kg (147 lb 14.9 oz) (09/10 0500)  Intake/Output from previous day:  Intake/Output Summary (Last 24 hours) at 01/21/12 1610 Last data filed at 01/21/12 0400  Gross per 24 hour  Intake    710 ml  Output    550 ml  Net    160 ml    Physical Exam: General appearance: alert, cooperative and no distress Lungs: clear to auscultation bilaterally Heart: regular rate and rhythm   Rate: 80  Rhythm: normal sinus rhythm, QTc 501  Lab Results: No results found for this basename: WBC:2,HGB:2,PLT:2 in the last 72 hours  Basename 01/21/12 0400 01/20/12 0530  NA 135 135  K 5.5* 3.5  CL 101 97  CO2 20 27  GLUCOSE 94 88  BUN 18 16  CREATININE 0.68 0.83    Basename 01/19/12 0520 01/18/12 1620  TROPONINI <0.30 <0.30   Hepatic Function Panel No results found for this basename: PROT,ALBUMIN,AST,ALT,ALKPHOS,BILITOT,BILIDIR,IBILI in the last 72 hours No results found for this basename: CHOL in the last 72 hours  Basename 01/21/12 0400  INR 1.95*    Imaging: Imaging results have been reviewed  Cardiac Studies:  Assessment/Plan:   Principal Problem:  *Cardiac tamponade, recurrent episode, admitted 9/5 Active Problems:  Supra theraputic INR 5.5 01/16/12  Chest pain of pericarditis  Acute CHF, presumed secondary to Rt heart failure  Right bundle branch block and left anterior fascicular block  Nl coronaries 2001, low risk Myoview 2012  Dyspnea on exertion, secondary to AF  PAF, hx of RFA at [facility] complicated by tamponade  Renal insufficiency, SCr up to 1.5   GERD  HTN (hypertension)  RBBB, intermittent  CAD,  mild by cath 2001 and 09/04/11  COPD, by CXR, followed by Dr Maple Hudson   Plan- K+ is 5.5 this am after X 2.  Holding NSR. Plan to tx to telemetry, check stool for c-diff. Possibly home in am.   Corine Shelter PA-C 01/21/2012, 8:08 AM

## 2012-01-21 NOTE — Progress Notes (Signed)
Pt. Seen and examined. Agree with the NP/PA-C note as written.  She seems to be feeling better, possibly from the diuretics and colchicine. She is having loose stools, but it is unlikely c. Difficile - may more likely be due to colchicine. Will see if she has loose stools again today, in which case we may need to decrease her colchicine dose.  Ok to transfer to telemetry floor today. Plan for likely d/c home tomorrow morning.  Chrystie Nose, MD, [facility] Attending Cardiologist [facility]

## 2012-01-22 ENCOUNTER — Inpatient Hospital Stay (HOSPITAL_COMMUNITY): Payer: Medicare Other

## 2012-01-22 DIAGNOSIS — I639 Cerebral infarction, unspecified: Secondary | ICD-10-CM

## 2012-01-22 DIAGNOSIS — I6340 Cerebral infarction due to embolism of unspecified cerebral artery: Secondary | ICD-10-CM

## 2012-01-22 DIAGNOSIS — I4891 Unspecified atrial fibrillation: Secondary | ICD-10-CM

## 2012-01-22 LAB — GLUCOSE, CAPILLARY: Glucose-Capillary: 98 mg/dL (ref 70–99)

## 2012-01-22 LAB — BASIC METABOLIC PANEL
BUN: 16 mg/dL (ref 6–23)
CO2: 25 mEq/L (ref 19–32)
Calcium: 9.9 mg/dL (ref 8.4–10.5)
Chloride: 103 mEq/L (ref 96–112)
Creatinine, Ser: 0.78 mg/dL (ref 0.50–1.10)
GFR calc Af Amer: 87 mL/min — ABNORMAL LOW (ref >90)
GFR calc non Af Amer: 75 mL/min — ABNORMAL LOW (ref >90)
Glucose, Bld: 95 mg/dL (ref 70–99)
Potassium: 4.2 mEq/L (ref 3.5–5.1)
Sodium: 137 mEq/L (ref 135–145)

## 2012-01-22 LAB — SEDIMENTATION RATE: Sed Rate: 52 mm/hr — ABNORMAL HIGH (ref 0–22)

## 2012-01-22 LAB — PROTIME-INR
INR: 1.78 — ABNORMAL HIGH (ref 0.00–1.49)
Prothrombin Time: 21 seconds — ABNORMAL HIGH (ref 11.6–15.2)

## 2012-01-22 MED ORDER — WARFARIN SODIUM 2 MG PO TABS
2.0000 mg | ORAL_TABLET | ORAL | Status: AC
  Administered 2012-01-22: 2 mg via ORAL
  Filled 2012-01-22: qty 1

## 2012-01-22 MED ORDER — METOPROLOL TARTRATE 25 MG PO TABS
25.0000 mg | ORAL_TABLET | Freq: Two times a day (BID) | ORAL | Status: DC
  Administered 2012-01-22 – 2012-01-24 (×5): 25 mg via ORAL
  Filled 2012-01-22 (×6): qty 1

## 2012-01-22 MED ORDER — DIAZEPAM 5 MG PO TABS
5.0000 mg | ORAL_TABLET | Freq: Once | ORAL | Status: AC
  Administered 2012-01-22: 5 mg via ORAL

## 2012-01-22 MED ORDER — DIAZEPAM 5 MG PO TABS
ORAL_TABLET | ORAL | Status: AC
  Filled 2012-01-22: qty 1

## 2012-01-22 NOTE — Progress Notes (Signed)
Patient had SOB this morning, VSS , Oxygen sats 97-100% on room air. No complaints of chest pain or pain period. Patient stated she feels "stupid and feels funny." As nurse was assessing patient, patient begin to slowly pass out and briefly didn't respond until aggressively arousing patient. Patient continued to complain of not feeling well. Monitor tech reported that around the same time patient had increase in heart rate and converted to sinus rhythm. Rapid response nurse notified and advised nurse to check CBG and perform neuroassessment and to notify MD. CBG-98, Neuroassessment was within range with weak grip. EKG was done. Notified MD, no orders given but came to room to assess patient. Will continue to monitor to end of shift.

## 2012-01-22 NOTE — Progress Notes (Signed)
Vitals signs documented from this morning shortness of breath episode previously documented.

## 2012-01-22 NOTE — Progress Notes (Signed)
ANTICOAGULATION CONSULT NOTE - Follow Up Consult  Pharmacy Consult for Coumadin Indication: atrial fibrillation  Allergies  Allergen Reactions  . Protonix (Pantoprazole Sodium) Other (See Comments)    Abdominal pain    Patient Measurements: Height: 5\' 2"  (157.5 cm) Weight: 146 lb 13.2 oz (66.6 kg) (scale a) IBW/kg (Calculated) : 50.1  Heparin Dosing Weight:    Vital Signs: Temp: 98.1 F (36.7 C) (09/11 1441) Temp src: Oral (09/11 1441) BP: 131/65 mmHg (09/11 1441) Pulse Rate: 63  (09/11 1441)  Labs:  Basename 01/22/12 0540 01/21/12 0400 01/20/12 0530  HGB -- -- --  HCT -- -- --  PLT -- -- --  APTT -- -- --  LABPROT 21.0* 22.6* 19.8*  INR 1.78* 1.95* 1.65*  HEPARINUNFRC -- -- --  CREATININE 0.78 0.68 0.83  CKTOTAL -- -- --  CKMB -- -- --  TROPONINI -- -- --    Estimated Creatinine Clearance: 46.9 ml/min (by C-G formula based on Cr of 0.78).   Medications:  Prescriptions prior to admission  Medication Sig Dispense Refill  . acetaminophen (TYLENOL) 325 MG tablet Take 2 tablets (650 mg total) by mouth every 4 (four) hours as needed.      . calcium carbonate (OS-CAL - DOSED IN MG OF ELEMENTAL CALCIUM) 1250 MG tablet Take 1 tablet by mouth 2 (two) times daily.        Marland Kitchen diltiazem (CARDIZEM CD) 120 MG 24 hr capsule Take 1 capsule (120 mg total) by mouth daily.  30 capsule  5  . dofetilide (TIKOSYN) 250 MCG capsule Take 250 mcg by mouth 2 (two) times daily. Schedule is 8am and 8pm and cannot vary more than 1 hour      . furosemide (LASIX) 20 MG tablet Take 40 mg by mouth daily.      Marland Kitchen loratadine (CLARITIN) 10 MG tablet Take 10 mg by mouth daily as needed. For allergy symptoms      . loteprednol (LOTEMAX) 0.5 % ophthalmic suspension Place 1 drop into both eyes 2 (two) times daily.      . magnesium oxide (MAG-OX) 400 MG tablet Take 400 mg by mouth daily.      . metoprolol tartrate (LOPRESSOR) 25 MG tablet Take 25 mg by mouth 2 (two) times daily.      . Multiple  Vitamins-Minerals (MULTIVITAMINS THER. W/MINERALS) TABS Take 1 tablet by mouth every morning.        . Omega-3 Fatty Acids (FISH OIL) 1000 MG CAPS Take 2 capsules by mouth 2 (two) times daily.       . potassium chloride SA (K-DUR,KLOR-CON) 20 MEQ tablet Take 20 mEq by mouth daily.      . rosuvastatin (CRESTOR) 5 MG tablet Take 5 mg by mouth at bedtime.       . sucralfate (CARAFATE) 1 G tablet Take 1 g by mouth 2 (two) times daily.      . vitamin E 400 UNIT capsule Take 800 Units by mouth daily.        Marland Kitchen warfarin (COUMADIN) 2.5 MG tablet Take 2.5 mg by mouth daily.        Assessment: pericardial tamponade.  Anticoagulation: Warf-Rx: Afib (restarted 9/9 s/p cath and pericarditis); INR 1.78 down; INR >5 on admit (PTA dose= 2.5mg  po daily), vit k 9/5 for cath; Per cards note would like to keep INR 2.0-2.5.  --Pt had stroke like episode this AM - Cards thinks threw a clot when converted to NSR. Discussed with them that we don't use  heparin, consulted neurology. MRI= Questionable 3-33mm acute or subacute L MCA infarct. Paged Neuro MD on call Dr. Eilleen Kempf who had reviewed MRI and was ok with giving Coumadin. Paged cardiology PA regarding starting heparin but have not received a return call.   Goal of Therapy:  2-2.5 Monitor platelets by anticoagulation protocol: Yes   Plan:  1) Coumadin 2mg  po x 1 tonight. 2) Potentially may start heparin (pending cardiology decision) to cover until INR>2.  Misty Stanley Stillinger 01/22/2012,8:01 PM

## 2012-01-22 NOTE — Progress Notes (Signed)
Subjective: Episode this Am of feeling like she was out of it, knew what was happening but could not respond,  HR did increase to 130 just prior to episode and conversion to SR. No chest pain, SOB this AM with conversion to SR  Objective: Vital signs in last 24 hours: Temp:  [97.9 F (36.6 C)-98.7 F (37.1 C)] 98.7 F (37.1 C) (09/11 0538) Pulse Rate:  [74-76] 76  (09/11 0538) Resp:  [14-18] 18  (09/11 0538) BP: (129-136)/(61-71) 133/65 mmHg (09/11 0538) SpO2:  [94 %-98 %] 94 % (09/11 0538) Weight:  [66.6 kg (146 lb 13.2 oz)] 66.6 kg (146 lb 13.2 oz) (09/11 0538) Weight change: -0.5 kg (-1 lb 1.6 oz) Last BM Date: 01/21/12 Intake/Output from previous day: -705 09/10 0701 - 09/11 0700 In: 970 [P.O.:970] Out: 1675 [Urine:1675] Intake/Output this shift:    PE: General:Up eating BK, no swallowing problems, still feels "a little strange in her head" Heart:S1S2 RRR Lungs:clear without rales, rhonchi or wheezes Abd:+ BS, soft, non tender Ext:no edema Neuro:alert and oriented X 3, MAE, follows commands. + facial symmetry, with  Frown,  Slight downward droop of rt. Mouth with smile, grips and push pull of upper ext. Equal, push pull of lower ext equal.   Lab Results: No results found for this basename: WBC:2,HGB:2,HCT:2,PLT:2 in the last 72 hours BMET  Basename 01/22/12 0540 01/21/12 0400  NA 137 135  K 4.2 5.5*  CL 103 101  CO2 25 20  GLUCOSE 95 94  BUN 16 18  CREATININE 0.78 0.68  CALCIUM 9.9 9.4   No results found for this basename: TROPONINI:2,CK,MB:2 in the last 72 hours  Lab Results  Component Value Date   CHOL  Value: 89        ATP III CLASSIFICATION:  <200     mg/dL   Desirable  161-096  mg/dL   Borderline High  >=045    mg/dL   High        4/0/9811   HDL 39* 06/21/2008   LDLCALC  Value: 33        Total Cholesterol/HDL:CHD Risk Coronary Heart Disease Risk Table                     Men   Women  1/2 Average Risk   3.4   3.3  Average Risk       5.0   4.4  2 X Average Risk    9.6   7.1  3 X Average Risk  23.4   11.0        Use the calculated Patient Ratio above and the CHD Risk Table to determine the patient's CHD Risk.        ATP III CLASSIFICATION (LDL):  <100     mg/dL   Optimal  914-782  mg/dL   Near or Above                    Optimal  130-159  mg/dL   Borderline  956-213  mg/dL   High  >086     mg/dL   Very High 09/17/8467   TRIG 86 06/21/2008   CHOLHDL 2.3 06/21/2008   Lab Results  Component Value Date   HGBA1C  Value: 5.4 (NOTE)  According to the ADA Clinical Practice Recommendations for 2011, when HbA1c is used as a screening test:   >=6.5%   Diagnostic of Diabetes Mellitus           (if abnormal result  is confirmed)  5.7-6.4%   Increased risk of developing Diabetes Mellitus  References:Diagnosis and Classification of Diabetes Mellitus,Diabetes Care,2011,34(Suppl 1):S62-S69 and Standards of Medical Care in         Diabetes - 2011,Diabetes Care,2011,34  (Suppl 1):S11-S61. 10/18/2009     Lab Results  Component Value Date   TSH 2.278 01/05/2012    EKG: Orders placed during the hospital encounter of 01/16/12  . EKG 12-LEAD  . EKG 12-LEAD  . ED EKG  . ED EKG  . EKG 12-LEAD  . EKG 12-LEAD  . EKG 12-LEAD  . EKG 12-LEAD  . EKG 12-LEAD  . EKG  . EKG 12-LEAD  . EKG 12-LEAD  . EKG 12-LEAD  . EKG 12-LEAD  . EKG 12-LEAD  . EKG 12-LEAD  . EKG 12-LEAD  . EKG 12-LEAD  . EKG 12-LEAD  . EKG 12-LEAD  . EKG 12-LEAD  . EKG 12-LEAD  . EKG 12-LEAD  . EKG 12-LEAD  . EKG 12-LEAD  . EKG 12-LEAD    Studies/Results: No results found.  Medications: I have reviewed the patient's current medications. Scheduled Meds:   . atorvastatin  10 mg Oral q1800  . calcium carbonate  1 tablet Oral BID WC  . colchicine  0.6 mg Oral Daily  . diltiazem  120 mg Oral Daily  . diltiazem  20 mg Intravenous Once  . dofetilide  250 mcg Oral BID  . loteprednol  1 drop Both Eyes BID  . magnesium oxide  400 mg Oral  Daily  . metoprolol tartrate  12.5 mg Oral BID  . sodium chloride  3 mL Intravenous Q12H  . sucralfate  1 g Oral BID WC  . warfarin  1.5 mg Oral ONCE-1800  . Warfarin - Pharmacist Dosing Inpatient   Does not apply q1800   Continuous Infusions:   . sodium chloride Stopped (01/21/12 0400)   PRN Meds:.acetaminophen, acetaminophen, HYDROcodone-acetaminophen, loperamide, morphine injection, ondansetron (ZOFRAN) IV, sodium chloride, zolpidem  Assessment/Plan: Principal Problem:  *Cardiac tamponade, recurrent episode, admitted 9/5 Active Problems:  GERD  Right bundle branch block and left anterior fascicular block  Supra theraputic INR 5.5 01/16/12  HTN (hypertension)  Nl coronaries 2001, low risk Myoview 2012  Dyspnea on exertion, secondary to AF  RBBB, intermittent  CAD, mild by cath 2001 and 09/04/11  COPD, by CXR, followed by Dr Maple Hudson  PAF, hx of RFA at [facility] complicated by tamponade  Renal insufficiency, SCr up to 1.5   Chest pain of pericarditis  Acute CHF, presumed secondary to Rt heart failure  PLAN: ? TIA with conversion to SR this AM, INR 1.78, ? Add Heparin, will discuss  With MD.Pain improved with Colchicine.  PT signed off will have cardiac rehab ambulate.  LOS: 6 days   INGOLD,LAURA R 01/22/2012, 8:31 AM   I have seen and examined the patient along with Nada Boozer, NP.  I have reviewed the chart, notes and new data.  I agree with NP's note.  Key new complaints: brief presyncopal event this AM. Not sure if neurological (TIA, embolic?) or hemodynamic. It happened right around time of conversion from AF to NSR. No real pause. Had a couple of junctional beats and then NSR. Key examination changes: no focal neurological findings are identified. CNs II-XII  intact, normal UE and LE strength, fluent speech. Key new findings / data: INR 1.7  PLAN: Check MRI head. Keep in hospital until full therapeutic anticoagulation. Increase rate control medications.  Thurmon Fair, MD,  [facility] [phone] 01/22/2012, 10:04 AM

## 2012-01-22 NOTE — Progress Notes (Signed)
Notified Main pharmacy that Neurologist stated is OK to start patient on coumadin and will further follow-up in regards to starting heparin until able to look at the rest of the slides of the MRI secondary to episode of SOB with weakness (see MD,PA,RN notes from earlier today) to determine whether or not Heparin will or will not be administered.
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
## 2012-01-22 NOTE — Progress Notes (Signed)
Will ask Neuro to see concerning to proceed with Heparin or just continue with slow drift up with coumadin.

## 2012-01-22 NOTE — Progress Notes (Signed)
CARDIAC REHAB PHASE I   PRE:  Rate/Rhythm: 81 SR    BP: sitting 114/64    SaO2: 96 RA  MODE:  Ambulation: 360 ft   POST:  Rate/Rhythm: 90 SR    BP: sitting 130/70     SaO2: 96 RA  Tolerated fairly well. DOE and rest x2 during walk. Steady. Had to sit and rest after brushing her hair before walk. VSS. Gave HF book, will discuss more later. Pt sts she loves salt.  [phone]  Harriet Masson CES, ACSM

## 2012-01-23 ENCOUNTER — Other Ambulatory Visit: Payer: Self-pay

## 2012-01-23 DIAGNOSIS — R0609 Other forms of dyspnea: Secondary | ICD-10-CM

## 2012-01-23 DIAGNOSIS — I6340 Cerebral infarction due to embolism of unspecified cerebral artery: Secondary | ICD-10-CM

## 2012-01-23 LAB — BASIC METABOLIC PANEL
BUN: 17 mg/dL (ref 6–23)
CO2: 25 mEq/L (ref 19–32)
Calcium: 9.9 mg/dL (ref 8.4–10.5)
Chloride: 102 mEq/L (ref 96–112)
Creatinine, Ser: 0.82 mg/dL (ref 0.50–1.10)
GFR calc Af Amer: 74 mL/min — ABNORMAL LOW (ref >90)
GFR calc non Af Amer: 64 mL/min — ABNORMAL LOW (ref >90)
Glucose, Bld: 128 mg/dL — ABNORMAL HIGH (ref 70–99)
Potassium: 4 mEq/L (ref 3.5–5.1)
Sodium: 138 mEq/L (ref 135–145)

## 2012-01-23 LAB — PROTIME-INR
INR: 1.83 — ABNORMAL HIGH (ref 0.00–1.49)
Prothrombin Time: 21.5 seconds — ABNORMAL HIGH (ref 11.6–15.2)

## 2012-01-23 MED ORDER — WARFARIN SODIUM 2.5 MG PO TABS
2.5000 mg | ORAL_TABLET | Freq: Once | ORAL | Status: DC
  Filled 2012-01-23: qty 1

## 2012-01-23 NOTE — Progress Notes (Signed)
CARDIAC REHAB PHASE I   PRE:  Rate/Rhythm: 56 SB    BP: sitting 120/76    SaO2:   MODE:  Ambulation: 400 ft   POST:  Rate/Rhythm: 67    BP: sitting 130/70     SaO2: 97 RA  Tolerated well. Steady. Rest x1 for exertion and SOB. VSS.  [phone] Harriet Masson CES, ACSM

## 2012-01-23 NOTE — Progress Notes (Signed)
VASCULAR LAB PRELIMINARY  PRELIMINARY  PRELIMINARY  PRELIMINARY  1.  Carotid duplex  completed.    Preliminary report:  Bilateral:  No evidence of hemodynamically significant internal carotid artery stenosis.   Vertebral artery flow is antegrade.    2.  TCD completed.   Shawnia Vizcarrondo, RVT 01/23/2012, 12:43 PM

## 2012-01-23 NOTE — Progress Notes (Signed)
ANTICOAGULATION CONSULT NOTE - Follow Up Consult  Pharmacy Consult for Coumadin Indication: atrial fibrillation  Allergies  Allergen Reactions  . Protonix (Pantoprazole Sodium) Other (See Comments)    Abdominal pain    Patient Measurements: Height: 5\' 2"  (157.5 cm) Weight: 145 lb 12.8 oz (66.134 kg) (a scale) IBW/kg (Calculated) : 50.1  Heparin Dosing Weight:   Vital Signs: Temp: 97.4 F (36.3 C) (09/12 0448) Temp src: Oral (09/12 0448) BP: 116/60 mmHg (09/12 0918) Pulse Rate: 69  (09/12 0918)  Labs:  Basename 01/23/12 0555 01/22/12 0540 01/21/12 0400  HGB -- -- --  HCT -- -- --  PLT -- -- --  APTT -- -- --  LABPROT 21.5* 21.0* 22.6*  INR 1.83* 1.78* 1.95*  HEPARINUNFRC -- -- --  CREATININE 0.82 0.78 0.68  CKTOTAL -- -- --  CKMB -- -- --  TROPONINI -- -- --    Estimated Creatinine Clearance: 45.6 ml/min (by C-G formula based on Cr of 0.82).  Assessment: 86yof continuing Coumadin for Afib (restarted 9/9 s/p cath and pericarditis). Per Cardiology, titrate Coumadin dose to goal INR 2-2.5. INR (1.83) is subtherapeutic and has slowly trended up - will increase dose conservatively due to admit INR of 5.52 on PTA regimen (2.5mg  daily). Noted possible small acute infarct on 9/11. Per Neuro and Cardiology, continue Coumadin therapy but will avoid heparin bridging at this time. - No CBC since 9/7 - No significant bleeding reported  Goal of Therapy:  INR 2-2.5 (per Cardiology)   Plan:  1. Coumadin 2.5mg  po x 1 today 2. Follow-up AM INR  Cleon Dew [phone] 01/23/2012,9:37 AM

## 2012-01-23 NOTE — Progress Notes (Signed)
Subjective: No complaints, no chest pain, no SOB  Objective: Vital signs in last 24 hours: Temp:  [97.4 F (36.3 C)-98.5 F (36.9 C)] 97.4 F (36.3 C) (09/12 0448) Pulse Rate:  [63-75] 67  (09/12 0448) Resp:  [17-20] 18  (09/12 0448) BP: (121-131)/(65-74) 131/69 mmHg (09/12 0448) SpO2:  [96 %-98 %] 96 % (09/12 0448) Weight:  [66.134 kg (145 lb 12.8 oz)] 66.134 kg (145 lb 12.8 oz) (09/12 0448) Weight change: -0.466 kg (-1 lb 0.4 oz) Last BM Date: 01/23/12 Intake/Output from previous day: -1060 09/11 0701 - 09/12 0700 In: 240 [P.O.:240] Out: 1300 [Urine:1300] Intake/Output this shift: Total I/O In: 360 [P.O.:360] Out: -   PE: General:alert and oriented, MAE follows commands Heart:S1S2 RRR Lungs:clear without rales rhonchi or wheezes Abd:+BS soft non tender Ext:no edema    Lab Results:  BMET  Basename 01/23/12 0555 01/22/12 0540  NA 138 137  K 4.0 4.2  CL 102 103  CO2 25 25  GLUCOSE 128* 95  BUN 17 16  CREATININE 0.82 0.78  CALCIUM 9.9 9.9         Lab Results  Component Value Date   TSH 2.278 01/05/2012     EKG: Orders placed during the hospital encounter of 01/16/12  . EKG 12-LEAD  . EKG 12-LEAD  . ED EKG  . ED EKG  . EKG 12-LEAD  . EKG 12-LEAD  . EKG 12-LEAD  . EKG 12-LEAD  . EKG 12-LEAD  . EKG  . EKG 12-LEAD  . EKG 12-LEAD  . EKG 12-LEAD  . EKG 12-LEAD  . EKG 12-LEAD  . EKG 12-LEAD  . EKG 12-LEAD  . EKG 12-LEAD  . EKG 12-LEAD  . EKG 12-LEAD  . EKG 12-LEAD  . EKG 12-LEAD  . EKG 12-LEAD  . EKG 12-LEAD  . EKG 12-LEAD  . EKG 12-LEAD  . EKG 12-LEAD    Studies/Results: Mr Brain Wo Contrast  01/22/2012  *RADIOLOGY REPORT*  Clinical Data: 78 year old female with altered mental status. Transient ischemic attack.  MRI HEAD WITHOUT CONTRAST  Technique:  Multiplanar, multiecho pulse sequences of the brain and surrounding structures were obtained according to standard protocol without intravenous contrast.  Comparison: Head CT without contrast  02/15/2006.  Findings: Questionable 3-4 mm area of mildly restricted diffusion in the subcortical white matter of the left motor strip (series 3 image 20).  There is T2 and FLAIR hyperintensity here, so this finding might be artifactual T2 shine through.  This is near the area of right upper extremity motor representation.  Diffusion elsewhere is within normal limits. Major intracranial vascular flow voids are preserved.  No midline shift, ventriculomegaly, mass effect, evidence of mass lesion, extra-axial collection or acute intracranial hemorrhage. Cervicomedullary junction and pituitary are within normal limits. There is a small area of chronic micro hemorrhage in the left frontal lobe on series 7 image 15.  Mild for age scattered T2 and FLAIR hyperintensity in the cerebral white matter.  Mild to moderate for age T2 heterogeneity in the deep gray matter nuclei. Brain stem and cerebellum are within normal limits.  Negative for age visualized cervical spine.  Normal bone marrow signal.  Postoperative changes to the globes. Mild left mastoid fluid, other Visualized paranasal sinuses and mastoids are clear.  Negative scalp soft tissues.  IMPRESSION: 1.  Questionable 3-4 mm acute or subacute left MCA territory infarct (left frontal lobe motor strip subcortical white matter). This could be artifact. 2.  No other acute intracranial abnormality. 3.  Mild  for age signal changes in the cerebral white matter and deep gray matter nuclei, most commonly due to chronic small vessel disease.   Original Report Authenticated By: Harley Hallmark, M.D.     Medications: I have reviewed the patient's current medications.    Marland Kitchen atorvastatin  10 mg Oral q1800  . calcium carbonate  1 tablet Oral BID WC  . colchicine  0.6 mg Oral Daily  . diazepam  5 mg Oral Once  . diltiazem  120 mg Oral Daily  . diltiazem  20 mg Intravenous Once  . dofetilide  250 mcg Oral BID  . loteprednol  1 drop Both Eyes BID  . magnesium oxide  400 mg  Oral Daily  . metoprolol tartrate  25 mg Oral BID  . sodium chloride  3 mL Intravenous Q12H  . sucralfate  1 g Oral BID WC  . warfarin  2 mg Oral NOW  . Warfarin - Pharmacist Dosing Inpatient   Does not apply q1800  . DISCONTD: diazepam      . DISCONTD: metoprolol tartrate  12.5 mg Oral BID   Assessment/Plan: Principal Problem:  *Cardiac tamponade, recurrent episode, admitted 9/5 Active Problems:  GERD  Right bundle branch block and left anterior fascicular block  Supra theraputic INR 5.5 01/16/12  HTN (hypertension)  Nl coronaries 2001, low risk Myoview 2012  Dyspnea on exertion, secondary to AF  RBBB, intermittent  CAD, mild by cath 2001 and 09/04/11  COPD, by CXR, followed by Dr Maple Hudson  PAF, hx of RFA at [facility] complicated by tamponade  Renal insufficiency, SCr up to 1.5   Chest pain of pericarditis  Acute CHF, presumed secondary to Rt heart failure  Cerebral embolism with cerebral infarction  PLAN: see neuro note, will continue with coumadin load alone no heparin.  Maintaining SR no  PAF since yesterday AM. Keep in hospital today.  LOS: 7 days   INGOLD,LAURA R 01/23/2012, 8:30 AM   I have seen and examined the patient along with Nada Boozer, NP.  I have reviewed the chart, notes and new data.  I agree with NP's note.  Key new complaints: no new neurological or cardiac complaints Key examination changes: neuro non focal; in NSR Key new findings / data: MRI results and neuro consult reviewd  PLAN: Overall findings are reassuring, but reinforce the importance of anticoagulation therapy, notwithstanding the presence of the pericardial effusion. Keep INR 2.0-2.5. Not sure that IV heparin benefit outweighs the risk at this point, especially with INR now >1.8.  Thurmon Fair, MD, [facility] [phone] 01/23/2012, 9:04 AM

## 2012-01-23 NOTE — Progress Notes (Signed)
Stroke Team Progress Note  HISTORY [name] is an 78 y.o. female past medical history significant for recent radiofrequency ablation for paroxysmal atrial fibrillation at [facility] 2 weeks ago. Since the ablation procedure and is currently on treatment with dofetilide metoprolol and sustained-release diltiazem. Warfarin anticoagulation had been restarted. She was briefly admitted to [facility] on August 25 with an episode of paroxysmal mitral fibrillation that resolved spontaneously overnight. That time she was therapeutically anticoagulated with an INR of 3.0. She was seen on 01-15-12 for worsening dyspnea but admitted to the hospital on 01-16-12 due to CP. At time of admission her INR was 5.52 . While in hospital patient has been in and out of PAF. 01-22-12 patient had a period of time where she was feeling not her self . Patient describes event as a feeling of "awkwardness" and SOB followed by the ability to hear people around her but not able to answer. This was followed by a feeling of sever heaviness in her arms and difficulty forming her words and communicating which gradually resolved over a period of minutes. During this event Cardiology noted no real pause, a couple of junctional beats and then NSR. Current INR was 1.78. Patient feels back to her baseline.   Patient was not a TPA candidate secondary to quick resolutaion.   SUBJECTIVE No family is at the bedside.  Overall she feels her condition is stable.   OBJECTIVE Most recent Vital Signs: Filed Vitals:   01/22/12 1441 01/22/12 2200 01/23/12 0219 01/23/12 0448  BP: 131/65 123/69 124/69 131/69  Pulse: 63 65 63 67  Temp: 98.1 F (36.7 C) 97.8 F (36.6 C) 98.5 F (36.9 C) 97.4 F (36.3 C)  TempSrc: Oral Oral Oral Oral  Resp: 20 18 17 18   Height:      Weight:    66.134 kg (145 lb 12.8 oz)  SpO2: 96% 98% 96% 96%   CBG (last 3)   Basename 01/22/12 0808  GLUCAP 98   Intake/Output from previous day: 09/11 0701 -  09/12 0700 In: 240 [P.O.:240] Out: 1300 [Urine:1300]  IV Fluid Intake:     . sodium chloride Stopped (01/21/12 0400)    MEDICATIONS    . atorvastatin  10 mg Oral q1800  . calcium carbonate  1 tablet Oral BID WC  . colchicine  0.6 mg Oral Daily  . diazepam  5 mg Oral Once  . diltiazem  120 mg Oral Daily  . diltiazem  20 mg Intravenous Once  . dofetilide  250 mcg Oral BID  . loteprednol  1 drop Both Eyes BID  . magnesium oxide  400 mg Oral Daily  . metoprolol tartrate  25 mg Oral BID  . sodium chloride  3 mL Intravenous Q12H  . sucralfate  1 g Oral BID WC  . warfarin  2 mg Oral NOW  . Warfarin - Pharmacist Dosing Inpatient   Does not apply q1800  . DISCONTD: diazepam      . DISCONTD: metoprolol tartrate  12.5 mg Oral BID   PRN:  acetaminophen, acetaminophen, HYDROcodone-acetaminophen, loperamide, morphine injection, ondansetron (ZOFRAN) IV, sodium chloride, zolpidem  Diet:  Cardiac thin liquids Activity:  As tolerated DVT Prophylaxis:  coumadin  CLINICALLY SIGNIFICANT STUDIES Basic Metabolic Panel:  Lab 01/23/12 9562 01/22/12 0540 01/18/12 0526  NA 138 137 --  K 4.0 4.2 --  CL 102 103 --  CO2 25 25 --  GLUCOSE 128* 95 --  BUN 17 16 --  CREATININE 0.82  0.78 --  CALCIUM 9.9 9.9 --  MG -- -- 2.1  PHOS -- -- --   CBC:  Lab 01/18/12 0526 01/16/12 2035 01/16/12 2018  WBC 10.0 -- 13.2*  NEUTROABS 7.1 -- --  HGB 10.4* 11.6* --  HCT 31.8* 34.0* --  MCV 92.4 -- 93.0  PLT 275 -- 316   Coagulation:  Lab 01/23/12 0555 01/22/12 0540 01/21/12 0400 01/20/12 0530  LABPROT 21.5* 21.0* 22.6* 19.8*  INR 1.83* 1.78* 1.95* 1.65*   Cardiac Enzymes:  Lab 01/19/12 0520 01/18/12 1620  CKTOTAL -- 28  CKMB -- 1.3  CKMBINDEX -- --  TROPONINI <0.30 <0.30   Lipid Panel    Component Value Date/Time   CHOL  Value: 89        ATP III CLASSIFICATION:  <200     mg/dL   Desirable  409-811  mg/dL   Borderline High  >=914    mg/dL   High        11/18/2954 0405   TRIG 86 06/21/2008 0405    HDL 39* 06/21/2008 0405   CHOLHDL 2.3 06/21/2008 0405   VLDL 17 06/21/2008 0405   LDLCALC  Value: 33        Total Cholesterol/HDL:CHD Risk Coronary Heart Disease Risk Table                     Men   Women  1/2 Average Risk   3.4   3.3  Average Risk       5.0   4.4  2 X Average Risk   9.6   7.1  3 X Average Risk  23.4   11.0        Use the calculated Patient Ratio above and the CHD Risk Table to determine the patient's CHD Risk.        ATP III CLASSIFICATION (LDL):  <100     mg/dL   Optimal  213-086  mg/dL   Near or Above                    Optimal  130-159  mg/dL   Borderline  578-469  mg/dL   High  >629     mg/dL   Very High 09/12/8411 2440   HgbA1C  Lab Results  Component Value Date   HGBA1C  Value: 5.4 (NOTE)                                                                       According to the ADA Clinical Practice Recommendations for 2011, when HbA1c is used as a screening test:   >=6.5%   Diagnostic of Diabetes Mellitus           (if abnormal result  is confirmed)  5.7-6.4%   Increased risk of developing Diabetes Mellitus  References:Diagnosis and Classification of Diabetes Mellitus,Diabetes Care,2011,34(Suppl 1):S62-S69 and Standards of Medical Care in         Diabetes - 2011,Diabetes Care,2011,34  (Suppl 1):S11-S61. 10/18/2009    Urine Drug Screen:   No results found for this basename: labopia, cocainscrnur, labbenz, amphetmu, thcu, labbarb    Alcohol Level: No results found for this basename: ETH:2 in the last 168 hours  CT of the brain  MRI of the brain  01/22/2012  1.  Questionable 3-4 mm acute or subacute left MCA territory infarct (left frontal lobe motor strip subcortical white matter). This could be artifact. 2.  No other acute intracranial abnormality. 3.  Mild for age signal changes in the cerebral white matter and deep gray matter nuclei, most commonly due to chronic small vessel disease.    MRA of the brain    2D Echocardiogram  01/18/2012 EF 55-60% with no source of embolus. Partially  collapsed and hypokinetic. Mild RV collapse is noted.  Carotid Doppler    Therapy Recommendations no therapy needs  Physical Exam   Pleasant elderly caucasian lady not in distress.Awake alert. Afebrile. Head is nontraumatic. Neck is supple without bruit. Hearing is normal. Cardiac exam no murmur or gallop. Lungs are clear to auscultation. Distal pulses are well felt.  Neurological Exam ; Awake  Alert oriented x 3. Normal speech and language.eye movements full without nystagmus.Fundi not visualized. Vision acuity and fields appear normal. Face symmetric. Tongue midline. Normal strength, tone, reflexes and coordination. Normal sensation. Gait deferred.  ASSESSMENT Ms. Chanelle Hodsdon Touhey is a 78 y.o. female admitted with afib over 1 week ago who yesterday had halted speech, now resolved. Imaging confirms a tiny left MCA infarct. Infarct felt to be embolic secondary to known atrial fibrillation. On Pradaxa in the ast; on coumadin at time of the event, INR subtherapuetic at 1.4, Now on warfarin for secondary stroke prevention with INR 1.8. Dr. Pearlean Brownie discussed with Dr. Royann Shivers. Will hold warfarin today and resume pradaxa tomorrow. Patient with no resultant  Neuro symptoms.   FMD Hypertension Hyperlipidemia, LDL not drawn, on statin PTA  Hospital day # 7  TREATMENT/PLAN  Hold coumadin today  Resume pradaxa tomorrow  Check carotid doppler and TCD D/W Dr Marlana Salvage Annie Main, MSN, RN, ANVP-BC, ANP-BC, GNP-BC [facility] Pager: [phone] 01/23/2012 8:47 AM  Scribe for Dr. Delia Heady, Stroke Center Medical Director, who has personally reviewed chart, pertinent data, examined the patient and developed the plan of care. Pager:  [phone]

## 2012-01-24 ENCOUNTER — Encounter (HOSPITAL_COMMUNITY): Payer: Self-pay | Admitting: Cardiology

## 2012-01-24 DIAGNOSIS — I6340 Cerebral infarction due to embolism of unspecified cerebral artery: Secondary | ICD-10-CM

## 2012-01-24 DIAGNOSIS — I10 Essential (primary) hypertension: Secondary | ICD-10-CM

## 2012-01-24 LAB — BASIC METABOLIC PANEL
BUN: 16 mg/dL (ref 6–23)
CO2: 26 mEq/L (ref 19–32)
Calcium: 9.6 mg/dL (ref 8.4–10.5)
Chloride: 103 mEq/L (ref 96–112)
Creatinine, Ser: 0.87 mg/dL (ref 0.50–1.10)
GFR calc Af Amer: 69 mL/min — ABNORMAL LOW (ref >90)
GFR calc non Af Amer: 59 mL/min — ABNORMAL LOW (ref >90)
Glucose, Bld: 89 mg/dL (ref 70–99)
Potassium: 3.8 mEq/L (ref 3.5–5.1)
Sodium: 138 mEq/L (ref 135–145)

## 2012-01-24 LAB — CBC
HCT: 37.3 % (ref 36.0–46.0)
MCH: 30.2 pg (ref 26.0–34.0)
MCHC: 32.7 g/dL (ref 30.0–36.0)
MCV: 92.3 fL (ref 78.0–100.0)
RDW: 12.9 % (ref 11.5–15.5)
WBC: 8.4 10*3/uL (ref 4.0–10.5)

## 2012-01-24 LAB — LIPID PANEL
Cholesterol: 121 mg/dL (ref 0–200)
HDL: 33 mg/dL — ABNORMAL LOW (ref >39)
LDL Cholesterol: 68 mg/dL (ref 0–99)
Total CHOL/HDL Ratio: 3.7 RATIO
Triglycerides: 102 mg/dL (ref <150)
VLDL: 20 mg/dL (ref 0–40)

## 2012-01-24 LAB — PROTIME-INR
INR: 1.85 — ABNORMAL HIGH (ref 0.00–1.49)
Prothrombin Time: 21.7 seconds — ABNORMAL HIGH (ref 11.6–15.2)

## 2012-01-24 MED ORDER — POTASSIUM CHLORIDE CRYS ER 20 MEQ PO TBCR
20.0000 meq | EXTENDED_RELEASE_TABLET | Freq: Once | ORAL | Status: AC
  Administered 2012-01-24: 20 meq via ORAL
  Filled 2012-01-24: qty 1

## 2012-01-24 MED ORDER — HYDROCODONE-ACETAMINOPHEN 5-325 MG PO TABS: 1.0000 | ORAL_TABLET | Freq: Four times a day (QID) | ORAL | Status: AC | PRN

## 2012-01-24 MED ORDER — COLCHICINE 0.6 MG PO TABS: 0.6000 mg | ORAL_TABLET | Freq: Every day | ORAL | Status: DC

## 2012-01-24 MED ORDER — DABIGATRAN ETEXILATE MESYLATE 150 MG PO CAPS: 150.0000 mg | ORAL_CAPSULE | Freq: Two times a day (BID) | ORAL | Status: DC

## 2012-01-24 MED ORDER — DABIGATRAN ETEXILATE MESYLATE 150 MG PO CAPS
150.0000 mg | ORAL_CAPSULE | Freq: Two times a day (BID) | ORAL | Status: DC
  Administered 2012-01-24: 150 mg via ORAL
  Filled 2012-01-24 (×2): qty 1

## 2012-01-24 MED ORDER — HYDROCODONE-ACETAMINOPHEN 5-325 MG PO TABS: 1.0000 | ORAL_TABLET | ORAL | Status: DC | PRN

## 2012-01-24 NOTE — Discharge Summary (Signed)
Physician Discharge Summary  Patient ID: [name] MRN: [id number] DOB/AGE: [birth year] 78 y.o.  Admit date: 01/16/2012 Discharge date: 01/24/2012  Discharge Diagnoses:  Principal Problem:  *Cardiac tamponade, recurrent episode, admitted 9/5, but now felt to be pericarditits Active Problems:  GERD  Right bundle branch block and left anterior fascicular block  Supra theraputic INR 5.5 01/16/12  HTN (hypertension)  Nl coronaries 2001, low risk Myoview 2012  Dyspnea on exertion, secondary to AF  RBBB, intermittent  CAD, mild by cath 2001 and 09/04/11  COPD, by CXR, followed by Dr Maple Hudson  PAF, hx of RFA at [facility] complicated by tamponade  Renal insufficiency, SCr up to 1.5   Chest pain of pericarditis  Acute CHF, presumed secondary to Rt heart failure  Cerebral embolism with cerebral infarction   Discharged Condition: good  Procedures: 01/17/12 Rt. Heart cath by Dr. Karl Luke Course:  78 y.o. female with a past medical history significant for recent radiofrequency ablation for paroxysmal atrial fibrillation complicated by pericardial effusion requiring drainage at [facility] 2 weeks ago prior to this admit..  She had several episodes of recurrent paroxysmal intrafibrillation since the ablation procedure and is currently on treatment with dofetilide metoprolol and sustained-release diltiazem. Warfarin anticoagulation was restarted.  She was briefly admitted to [facility] on August 25 with an episode of paroxysmal mitral fibrillation that resolved spontaneously overnight. That time she was therapeutically anticoagulated with an INR of 3.0. Office echocardiogram performed on August 30 showed only a trivial pericardial effusion without any evidence whatsoever of tamponade.   For the last roughly 2 days prior to admit she has had worsening shortness of breath with profound orthopnea. She found most relief sitting forward leaning on her elbows. She she had an episode of what  sounds like paroxysmal nocturnal dyspnea last night. The symptoms have waxed and waned in intensity over the last 48 hours prior to admit. She has also developed some mild ankle edema although there has been no substantial change in her overall body weight. Her po intake has been poor due to anorexia.  She denied any overt bleeding.  she has not had any focal neurological deficits. She has an event monitor that has shown several episodes of paroxysmal atrial tachycardia initial fibrillation all of them generally short-lived.   She was seen in the office 01/16/12 when she complained of dyspnea but she was not particularly ill at that time. An increased dose of diuretics was prescribed. Around 6 PM the same afternoon she had sudden onset of severe right-sided chest and flank pain and worsening dyspnea and came to the emergency room. By the time she arrived the discomfort had subsided but she remained profoundly dyspneic and orthopneic. She was pain-free on exam although she did have tachypnea and dyspnea. Her prothrombin time was found to be severely elevated. She was admitted for further evaluation and treatment of her shortness of breath.  To the [facility] felt most likely explanation with acute pericardial tamponade on secondary to recurrent pericardial effusion in the setting of excessive hypo-prothrombinanemia Following radiofrequency ablation of atrial fibrillation complicated by perforation and pericardial effusion.  In the emergency room she was given vitamin K.  By the next morning INR was 2.6 but dropping, 2-D echo was done and patient was in and out of atrial fibrillation with right bundle branch block on Tikosyn.  The echo revealed an equivocal exam. Dr. Royann Shivers performed a right heart cath to assist in his decision to perform pericardiocentesis.  Per Dr. Royann Shivers: Right heart catheterization findings were more consistent with moderate left heart failure than with pericardial tamponade. The  patient has preserved left ventricular systolic function. There is a mild pulmonary arterial hypertension. The findings are consistent with pseudonormalization of the mitral inflow on echocardiography and elevation in Pro B. Natruretic peptide on lab test. Pericardiocentesis does not appear to be immediately necessary and would be a risky endeavor since the patient's anticoagulation is not yet fully reversed. The patient's subjective complaints may be a reflection of acute pericarditis, see Report for full details.  Unfortunately patient continued with frequent episodes of acute shortness of breath and severe chest pain.  Dr. Donata Clay was asked to consult for cardiothoracic surgery, To ensure we were not missing a  Tamponade And possible need for pericardial window.  CT of the chest was ordered to rule out pulmonary embolus.  The CT revealed Small pericardial effusion. Small bilateral pleural effusions.  The patient was diuresed and she began feeling better.   She was on IV Cardizem drip secondary to paroxysmal A. Fib with RVR. The metoprolol was also continued.  By September 10 he had improved significantly and was transferred to telemetry bed.    On 01/22/2012 she had an episode when she was in rapid A. Fib and converted to sinus rhythm no significant pauses she became semi-conscious, she knew what was occurring but could not respond.  This episode cleared rather quickly but she still felt unsettled in her head.  Neuro consult was obtained as well as MRI of the head.  Otherwise on exam no acute deficits.  MRI was positive for a stroke, A very small one and without residual.  Her Coumadin was held for one day and then at day of discharge was changed to Pradaxa.  Cardiac rehabilitation walkedt the patient for several days prior to discharge and patient ambulated without complications. She was seen by neurology as well as Dr. Rennis Golden felt stable and ready for discharge home.  At time of discharge she was  continued with episodes of atrial fibrillation the rate was improved,, she continued on Tikosyn.. Continue home monitoring to evaluate A. Fib burden.  QTC today 503 ms, OK for QTc to be up to 550 ms before Tikosyn held.   Consults: vascular surgery  Significant Diagnostic Studies:   BMET    Component Value Date/Time   NA 138 01/24/2012 0535   K 3.8 01/24/2012 0535   CL 103 01/24/2012 0535   CO2 26 01/24/2012 0535   GLUCOSE 89 01/24/2012 0535   BUN 16 01/24/2012 0535   CREATININE 0.87 01/24/2012 0535   CALCIUM 9.6 01/24/2012 0535   GFRNONAA 59* 01/24/2012 0535   GFRAA 69* 01/24/2012 0535    CBC    Component Value Date/Time   WBC 8.4 01/24/2012 0535   RBC 4.04 01/24/2012 0535   HGB 12.2 01/24/2012 0535   HCT 37.3 01/24/2012 0535   PLT 360 01/24/2012 0535   MCV 92.3 01/24/2012 0535   MCH 30.2 01/24/2012 0535   MCHC 32.7 01/24/2012 0535   RDW 12.9 01/24/2012 0535   LYMPHSABS 1.3 01/18/2012 0526   MONOABS 1.4* 01/18/2012 0526   EOSABS 0.1 01/18/2012 0526   BASOSABS 0.0 01/18/2012 0526   Lipid Panel     Component Value Date/Time   CHOL 121 01/24/2012 0535   TRIG 102 01/24/2012 0535   HDL 33* 01/24/2012 0535   CHOLHDL 3.7 01/24/2012 0535   VLDL 20 01/24/2012 0535   LDLCALC 68 01/24/2012 0535  Carotid Dopplers No significant extracranial carotid artery stenosis demonstrated. Tortuous internal carotid arteries.Vertebrals are patent with antegrade flow  Trans-Cranial Dopplers This was a normal transcranial Doppler study, with normal flow direction and velocity of all identified vessels of the anterior and posterior circulations, with no evidence of stenosis, vasospasm or occlusion. Globally elevated pulsatility indices suggest increased intracranial pressure or diffuse intracranial atherosclerosis. Amended  MRI of the head IMPRESSION:  1. Questionable 3-4 mm acute or subacute left MCA territory  infarct (left frontal lobe motor strip subcortical white matter).  This could be artifact.  2. No  other acute intracranial abnormality.  3. Mild for age signal changes in the cerebral white matter and  deep gray matter nuclei, most commonly due to chronic small vessel  disease.  2-D echo 01/17/2012  Left ventricle: The cavity size was normal. Wall thickness was normal. Systolic function was vigorous. The estimated ejection fraction was in the range of 65% to 70%. Wall motion was normal; there were no regional wall motion abnormalities. Features are consistent with a pseudonormal left ventricular filling pattern, with concomitant abnormal relaxation and increased filling pressure (grade 2 diastolic dysfunction). Doppler parameters are consistent with elevated mean left atrial filling pressure. - Aortic valve: Mild regurgitation. - Mitral valve: Calcified annulus. Mildly thickened leaflets . - Left atrium: The atrium was mildly dilated. - Pericardium, extracardiac: A moderate pericardial effusion was identified posterior to the heart, along the left ventricular free wall, along the right ventricular free wall, and along the right atrial free wall. The fluid contained focal strands. There was a right pleural effusion. There was a left pleural effusion. Transthoracic echocardiography. M-mode, complete 2D, spectral Doppler, and color Doppler. Height: Height: 157.5cm. Height: 62in.  2-D echo 01/18/2012 Impressions:  - Compared to the most recent study, there has been a small decrease in the posterior pericadial fluid collection. There are some echocardiographic features of right heart failure, but no clear signs of tamponade physiology. The IVC appears smaller and collapses with inspiration  Portable chest x-ray 01/18/2012 PORTABLE CHEST - 1 VIEW  Comparison: 01/16/2012.  Findings: The heart is mildly enlarged but stable. There is  tortuosity and calcification of the thoracic aorta. There are  small bilateral pleural effusions and bibasilar atelectasis. No  edema or  definite infiltrates.  IMPRESSION:  Persistent small effusions and bibasilar atelectasis  CT of the chest IMPRESSION:  No evidence of significant pulmonary embolus. Cardiac enlargement  with configuration suggesting mitral stenosis. Pericardial and  bilateral pleural effusions. Bilateral basilar atelectasis   Discharge Exam: Blood pressure 132/68, pulse 84, temperature 98.2 F (36.8 C), temperature source Oral, resp. rate 18, height 5\' 2"  (1.575 m), weight 66.134 kg (145 lb 12.8 oz), SpO2 98.00%.   PE: General:alert and oriented  Heart:S1S2 RRR  Lungs:clear without rales, rhonchi or wheezes  Abd:+ BS, soft, non tender  Ext:no edema  Neuro:alert and oriented  Disposition: 01-Home or Self Care     Medication List     As of 01/24/2012 12:24 PM    STOP taking these medications         vitamin E 400 UNIT capsule      warfarin 2.5 MG tablet   Commonly known as: COUMADIN      TAKE these medications         acetaminophen 325 MG tablet   Commonly known as: TYLENOL   Take 2 tablets (650 mg total) by mouth every 4 (four) hours as needed.      calcium carbonate 1250  MG tablet   Commonly known as: OS-CAL - dosed in mg of elemental calcium   Take 1 tablet by mouth 2 (two) times daily.      colchicine 0.6 MG tablet   Take 1 tablet (0.6 mg total) by mouth daily.      dabigatran 150 MG Caps   Commonly known as: PRADAXA   Take 1 capsule (150 mg total) by mouth every 12 (twelve) hours.      diltiazem 120 MG 24 hr capsule   Commonly known as: CARDIZEM CD   Take 1 capsule (120 mg total) by mouth daily.      dofetilide 250 MCG capsule   Commonly known as: TIKOSYN   Take 250 mcg by mouth 2 (two) times daily. Schedule is 8am and 8pm and cannot vary more than 1 hour      Fish Oil 1000 MG Caps   Take 2 capsules by mouth 2 (two) times daily.      furosemide 20 MG tablet   Commonly known as: LASIX   Take 40 mg by mouth daily.      HYDROcodone-acetaminophen 5-325 MG per tablet     Commonly known as: NORCO/VICODIN   Take 1-2 tablets by mouth every 6 (six) hours as needed.      loratadine 10 MG tablet   Commonly known as: CLARITIN   Take 10 mg by mouth daily as needed. For allergy symptoms      loteprednol 0.5 % ophthalmic suspension   Commonly known as: LOTEMAX   Place 1 drop into both eyes 2 (two) times daily.      magnesium oxide 400 MG tablet   Commonly known as: MAG-OX   Take 400 mg by mouth daily.      metoprolol tartrate 25 MG tablet   Commonly known as: LOPRESSOR   Take 25 mg by mouth 2 (two) times daily.      multivitamins ther. w/minerals Tabs   Take 1 tablet by mouth every morning.      potassium chloride SA 20 MEQ tablet   Commonly known as: K-DUR,KLOR-CON   Take 20 mEq by mouth daily.      rosuvastatin 5 MG tablet   Commonly known as: CRESTOR   Take 5 mg by mouth at bedtime.      sucralfate 1 G tablet   Commonly known as: CARAFATE   Take 1 g by mouth 2 (two) times daily.        Follow-up Information    Follow up with CROITORU,MIHAI, MD. On 02/07/2012. (for a repeat Echocardiogram of your heart at 1:00 pm )    Contact information:   [address] [place] [address] [phone]       Follow up with Runell Gess, MD. On 02/12/2012. (at 2:15 pm)    Contact information:   [address] [place] [address] [phone]        Discharge Instructions:  Heart Healthy diet  Continue your home monitor until complete  Follow up as instructed and with Dr. Smith Robert.  Have blood work done 01/28/12 see lab slip  STROKE/TIA DISCHARGE INSTRUCTIONS    Signed: VHQION,GEXBM R 01/24/2012, 12:24 PM

## 2012-01-24 NOTE — Progress Notes (Signed)
Patient discharged with all belongings. Patient verbalizes understanding of all discharge instructions. Anastasia Fiedler RN.

## 2012-01-24 NOTE — Progress Notes (Signed)
Stroke Team Progress Note  HISTORY [name] is an 78 y.o. female past medical history significant for recent radiofrequency ablation for paroxysmal atrial fibrillation at [facility] 2 weeks ago. Since the ablation procedure and is currently on treatment with dofetilide metoprolol and sustained-release diltiazem. Warfarin anticoagulation had been restarted. She was briefly admitted to [facility] on August 25 with an episode of paroxysmal mitral fibrillation that resolved spontaneously overnight. That time she was therapeutically anticoagulated with an INR of 3.0. She was seen on 01-15-12 for worsening dyspnea but admitted to the hospital on 01-16-12 due to CP. At time of admission her INR was 5.52 . While in hospital patient has been in and out of PAF. 01-22-12 patient had a period of time where she was feeling not her self . Patient describes event as a feeling of "awkwardness" and SOB followed by the ability to hear people around her but not able to answer. This was followed by a feeling of sever heaviness in her arms and difficulty forming her words and communicating which gradually resolved over a period of minutes. During this event Cardiology noted no real pause, a couple of junctional beats and then NSR. Current INR was 1.78. Patient feels back to her baseline.  Patient was not a TPA candidate secondary to quick resolutaion.   SUBJECTIVE Patient's pastor at the bedside. Patient without complaints.  OBJECTIVE Most recent Vital Signs: Filed Vitals:   01/23/12 0918 01/23/12 1353 01/23/12 2200 01/24/12 0439  BP: 116/60 132/64 131/57 127/63  Pulse: 69 63 70 65  Temp:  97.8 F (36.6 C) 98.4 F (36.9 C) 98.2 F (36.8 C)  TempSrc:  Oral Oral Oral  Resp:  19 18 18   Height:      Weight:    66.134 kg (145 lb 12.8 oz)  SpO2:  98% 98% 98%   CBG (last 3)   Basename 01/22/12 0808  GLUCAP 98   Intake/Output from previous day: 09/12 0701 - 09/13 0700 In: 843 [P.O.:840;  I.V.:3] Out: 500 [Urine:500]  IV Fluid Intake:      . sodium chloride Stopped (01/21/12 0400)    MEDICATIONS     . atorvastatin  10 mg Oral q1800  . calcium carbonate  1 tablet Oral BID WC  . colchicine  0.6 mg Oral Daily  . diltiazem  120 mg Oral Daily  . diltiazem  20 mg Intravenous Once  . dofetilide  250 mcg Oral BID  . loteprednol  1 drop Both Eyes BID  . magnesium oxide  400 mg Oral Daily  . metoprolol tartrate  25 mg Oral BID  . sodium chloride  3 mL Intravenous Q12H  . sucralfate  1 g Oral BID WC  . DISCONTD: warfarin  2.5 mg Oral ONCE-1800  . DISCONTD: Warfarin - Pharmacist Dosing Inpatient   Does not apply q1800   PRN:  acetaminophen, acetaminophen, HYDROcodone-acetaminophen, loperamide, morphine injection, ondansetron (ZOFRAN) IV, sodium chloride, zolpidem  Diet:  Cardiac thin liquids Activity:  As tolerated DVT Prophylaxis:  Coumadin held yesterday  CLINICALLY SIGNIFICANT STUDIES Basic Metabolic Panel:   Lab 01/24/12 0535 01/23/12 0555 01/18/12 0526  NA 138 138 --  K 3.8 4.0 --  CL 103 102 --  CO2 26 25 --  GLUCOSE 89 128* --  BUN 16 17 --  CREATININE 0.87 0.82 --  CALCIUM 9.6 9.9 --  MG -- -- 2.1  PHOS -- -- --   CBC:   Lab 01/24/12 0535 01/18/12 0526  WBC  8.4 10.0  NEUTROABS -- 7.1  HGB 12.2 10.4*  HCT 37.3 31.8*  MCV 92.3 92.4  PLT 360 275   Coagulation:   Lab 01/24/12 0535 01/23/12 0555 01/22/12 0540 01/21/12 0400  LABPROT 21.7* 21.5* 21.0* 22.6*  INR 1.85* 1.83* 1.78* 1.95*   Cardiac Enzymes:   Lab 01/19/12 0520 01/18/12 1620  CKTOTAL -- 28  CKMB -- 1.3  CKMBINDEX -- --  TROPONINI <0.30 <0.30   Lipid Panel    Component Value Date/Time   CHOL 121 01/24/2012 0535   TRIG 102 01/24/2012 0535   HDL 33* 01/24/2012 0535   CHOLHDL 3.7 01/24/2012 0535   VLDL 20 01/24/2012 0535   LDLCALC 68 01/24/2012 0535   HgbA1C  Lab Results  Component Value Date   HGBA1C  Value: 5.4 (NOTE)                                                                        According to the ADA Clinical Practice Recommendations for 2011, when HbA1c is used as a screening test:   >=6.5%   Diagnostic of Diabetes Mellitus           (if abnormal result  is confirmed)  5.7-6.4%   Increased risk of developing Diabetes Mellitus  References:Diagnosis and Classification of Diabetes Mellitus,Diabetes Care,2011,34(Suppl 1):S62-S69 and Standards of Medical Care in         Diabetes - 2011,Diabetes Care,2011,34  (Suppl 1):S11-S61. 10/18/2009   MRI of the brain  01/22/2012  1.  Questionable 3-4 mm acute or subacute left MCA territory infarct (left frontal lobe motor strip subcortical white matter). This could be artifact. 2.  No other acute intracranial abnormality. 3.  Mild for age signal changes in the cerebral white matter and deep gray matter nuclei, most commonly due to chronic small vessel disease.    MRA of the brain  See TCD  2D Echocardiogram  01/18/2012 EF 55-60% with no source of embolus. Partially collapsed and hypokinetic. Mild RV collapse is noted.  Carotid Doppler  No evidence of hemodynamically significant internal carotid artery stenosis. Vertebral artery flow is antegrade.   Transcranial Doppler    Therapy Recommendations no therapy needs  Physical Exam   Pleasant elderly caucasian lady not in distress.Awake alert. Afebrile. Head is nontraumatic. Neck is supple without bruit. Hearing is normal. Cardiac exam no murmur or gallop. Lungs are clear to auscultation. Distal pulses are well felt.  Neurological Exam ; Awake  Alert oriented x 3. Normal speech and language.eye movements full without nystagmus.Fundi not visualized. Vision acuity and fields appear normal. Face symmetric. Tongue midline. Normal strength, tone, reflexes and coordination. Normal sensation. Gait deferred.   ASSESSMENT Ms. [name] is a 78 y.o. female admitted with afib over 1 week ago who yesterday had halted speech, now resolved. Imaging confirms a tiny left MCA infarct. Infarct  felt to be embolic secondary to known atrial fibrillation. On Pradaxa in the past; on coumadin at time of the event, INR subtherapuetic at 1.4,  warfarin now on hold, recommend pradaxa for secondary stroke prevention. INR 1.8. Patient with no resultant  Neuro symptoms.   FMD Hypertension Hyperlipidemia, LDL not drawn, on statin PTA  Hospital day # 8  TREATMENT/PLAN  Begin pradaxa  today   Ok for discharge from stroke standpoint Stroke Service will sign off. Follow up with Dr. Pearlean Brownie, Stroke Clinic, in 2 months.  D/W Dr. Rennis Golden  and Nada Boozer, NP  Annie Main, MSN, RN, ANVP-BC, ANP-BC, GNP-BC [facility] Pager: [phone] 01/24/2012 8:13 AM  Scribe for Dr. Delia Heady, Stroke Center Medical Director, who has personally reviewed chart, pertinent data, examined the patient and developed the plan of care. Pager:  [phone]

## 2012-01-24 NOTE — Progress Notes (Signed)
Pt. Seen and examined. Agree with the NP/PA-C note as written.  Discussed with stroke service. Plan start pradaxa today. INR <2.0. In and out of a-fib on tikosyn. Continue current dose. QTc around 500 msec. Avoid concomitant QT prolonging agents. Ok for discharge home today. Follow-up in the office. Continue colchicine for 6 months to avoid re-occurrence.  Chrystie Nose, MD, [facility] Attending Cardiologist [facility]

## 2012-01-24 NOTE — Progress Notes (Signed)
Subjective: No complaints,  Was in and out of PAF last pm.  Objective: Vital signs in last 24 hours: Temp:  [97.8 F (36.6 C)-98.4 F (36.9 C)] 98.2 F (36.8 C) (09/13 0439) Pulse Rate:  [63-70] 65  (09/13 0439) Resp:  [18-19] 18  (09/13 0439) BP: (116-132)/(57-64) 127/63 mmHg (09/13 0439) SpO2:  [98 %] 98 % (09/13 0439) Weight:  [66.134 kg (145 lb 12.8 oz)] 66.134 kg (145 lb 12.8 oz) (09/13 0439) Weight change: 0 kg (0 lb) Last BM Date: 01/23/12 Intake/Output from previous day: +345 09/12 0701 - 09/13 0700 In: 843 [P.O.:840; I.V.:3] Out: 500 [Urine:500] Intake/Output this shift: Total I/O In: -  Out: 825 [Urine:825]  PE: General:alert and oriented Heart:S1S2 RRR Lungs:clear without rales, rhonchi or wheezes Abd:+ BS, soft, non tender Ext:no edema Neuro:alert and oriented   Lab Results:  Basename 01/24/12 0535  WBC 8.4  HGB 12.2  HCT 37.3  PLT 360   BMET  Basename 01/24/12 0535 01/23/12 0555  NA 138 138  K 3.8 4.0  CL 103 102  CO2 26 25  GLUCOSE 89 128*  BUN 16 17  CREATININE 0.87 0.82  CALCIUM 9.6 9.9   No results found for this basename: TROPONINI:2,CK,MB:2 in the last 72 hours  Lab Results  Component Value Date   CHOL 121 01/24/2012   HDL 33* 01/24/2012   LDLCALC 68 01/24/2012   TRIG 102 01/24/2012   CHOLHDL 3.7 01/24/2012   Lab Results  Component Value Date   HGBA1C  Value: 5.4 (NOTE)                                                                       According to the ADA Clinical Practice Recommendations for 2011, when HbA1c is used as a screening test:   >=6.5%   Diagnostic of Diabetes Mellitus           (if abnormal result  is confirmed)  5.7-6.4%   Increased risk of developing Diabetes Mellitus  References:Diagnosis and Classification of Diabetes Mellitus,Diabetes Care,2011,34(Suppl 1):S62-S69 and Standards of Medical Care in         Diabetes - 2011,Diabetes Care,2011,34  (Suppl 1):S11-S61. 10/18/2009     Lab Results  Component Value Date   TSH  2.278 01/05/2012    Hepatic Function Panel No results found for this basename: PROT,ALBUMIN,AST,ALT,ALKPHOS,BILITOT,BILIDIR,IBILI in the last 72 hours  Basename 01/24/12 0535  CHOL 121   No results found for this basename: PROTIME in the last 72 hours    EKG: Orders placed during the hospital encounter of 01/16/12  . EKG 12-LEAD  . EKG 12-LEAD  . ED EKG  . ED EKG  . EKG 12-LEAD  . EKG 12-LEAD  . EKG 12-LEAD  . EKG 12-LEAD  . EKG 12-LEAD  . EKG  . EKG 12-LEAD  . EKG 12-LEAD  . EKG 12-LEAD  . EKG 12-LEAD  . EKG 12-LEAD  . EKG 12-LEAD  . EKG 12-LEAD  . EKG 12-LEAD  . EKG 12-LEAD  . EKG 12-LEAD  . EKG 12-LEAD  . EKG 12-LEAD  . EKG 12-LEAD  . EKG 12-LEAD  . EKG 12-LEAD  . EKG 12-LEAD  . EKG 12-LEAD  . EKG 12-LEAD  . EKG 12-LEAD  .  EKG 12-LEAD  . EKG 12-LEAD  . EKG 12-LEAD    Studies/Results: Mr Brain Wo Contrast  01/22/2012  *RADIOLOGY REPORT*  Clinical Data: 78 year old female with altered mental status. Transient ischemic attack.  MRI HEAD WITHOUT CONTRAST  Technique:  Multiplanar, multiecho pulse sequences of the brain and surrounding structures were obtained according to standard protocol without intravenous contrast.  Comparison: Head CT without contrast 02/15/2006.  Findings: Questionable 3-4 mm area of mildly restricted diffusion in the subcortical white matter of the left motor strip (series 3 image 20).  There is T2 and FLAIR hyperintensity here, so this finding might be artifactual T2 shine through.  This is near the area of right upper extremity motor representation.  Diffusion elsewhere is within normal limits. Major intracranial vascular flow voids are preserved.  No midline shift, ventriculomegaly, mass effect, evidence of mass lesion, extra-axial collection or acute intracranial hemorrhage. Cervicomedullary junction and pituitary are within normal limits. There is a small area of chronic micro hemorrhage in the left frontal lobe on series 7 image 15.  Mild  for age scattered T2 and FLAIR hyperintensity in the cerebral white matter.  Mild to moderate for age T2 heterogeneity in the deep gray matter nuclei. Brain stem and cerebellum are within normal limits.  Negative for age visualized cervical spine.  Normal bone marrow signal.  Postoperative changes to the globes. Mild left mastoid fluid, other Visualized paranasal sinuses and mastoids are clear.  Negative scalp soft tissues.  IMPRESSION: 1.  Questionable 3-4 mm acute or subacute left MCA territory infarct (left frontal lobe motor strip subcortical white matter). This could be artifact. 2.  No other acute intracranial abnormality. 3.  Mild for age signal changes in the cerebral white matter and deep gray matter nuclei, most commonly due to chronic small vessel disease.   Original Report Authenticated By: Harley Hallmark, M.D.    Preliminary Carotid Dopplers:  Preliminary report: Bilateral: No evidence of hemodynamically significant internal carotid artery stenosis. Vertebral artery flow is antegrade   Medications: I have reviewed the patient's current medications.    Marland Kitchen atorvastatin  10 mg Oral q1800  . calcium carbonate  1 tablet Oral BID WC  . colchicine  0.6 mg Oral Daily  . diltiazem  120 mg Oral Daily  . diltiazem  20 mg Intravenous Once  . dofetilide  250 mcg Oral BID  . loteprednol  1 drop Both Eyes BID  . magnesium oxide  400 mg Oral Daily  . metoprolol tartrate  25 mg Oral BID  . sodium chloride  3 mL Intravenous Q12H  . sucralfate  1 g Oral BID WC  . DISCONTD: warfarin  2.5 mg Oral ONCE-1800  . DISCONTD: Warfarin - Pharmacist Dosing Inpatient   Does not apply q1800   Assessment/Plan: Principal Problem:  *Cardiac tamponade, recurrent episode, admitted 9/5 Active Problems:  GERD  Right bundle branch block and left anterior fascicular block  Supra theraputic INR 5.5 01/16/12  HTN (hypertension)  Nl coronaries 2001, low risk Myoview 2012  Dyspnea on exertion, secondary to AF  RBBB,  intermittent  CAD, mild by cath 2001 and 09/04/11  COPD, by CXR, followed by Dr Maple Hudson  PAF, hx of RFA at [facility] complicated by tamponade  Renal insufficiency, SCr up to 1.5   Chest pain of pericarditis  Acute CHF, presumed secondary to Rt heart failure  Cerebral embolism with cerebral infarction  PLAN: INR still 1.85, coumadin held yesterday to add Pradaxa today .,  Pt for transcranial dopplers.  Episodes of PAF last pm.  Doing well, ? Discharge today and have 2DEcho in 2 weeks.  Much improved.  Continue home monitoring to evaluate A. Fib burden.   QTC today 503 ms,  OK for QTc to be up to 550 ms before Tikosyn held.   Continue colchicine for pericarditis.  LOS: 8 days   INGOLD,LAURA R 01/24/2012, 7:53 AM

## 2012-01-28 DIAGNOSIS — Z79899 Other long term (current) drug therapy: Secondary | ICD-10-CM | POA: Diagnosis not present

## 2012-02-07 DIAGNOSIS — I318 Other specified diseases of pericardium: Secondary | ICD-10-CM | POA: Diagnosis not present

## 2012-02-07 HISTORY — PX: US ECHOCARDIOGRAPHY: HXRAD669

## 2012-02-11 ENCOUNTER — Ambulatory Visit (INDEPENDENT_AMBULATORY_CARE_PROVIDER_SITE_OTHER): Payer: Medicare Other

## 2012-02-11 DIAGNOSIS — J309 Allergic rhinitis, unspecified: Secondary | ICD-10-CM | POA: Diagnosis not present

## 2012-02-12 DIAGNOSIS — I10 Essential (primary) hypertension: Secondary | ICD-10-CM | POA: Diagnosis not present

## 2012-02-12 DIAGNOSIS — J449 Chronic obstructive pulmonary disease, unspecified: Secondary | ICD-10-CM | POA: Diagnosis not present

## 2012-02-12 DIAGNOSIS — E782 Mixed hyperlipidemia: Secondary | ICD-10-CM | POA: Diagnosis not present

## 2012-02-12 DIAGNOSIS — I4891 Unspecified atrial fibrillation: Secondary | ICD-10-CM | POA: Diagnosis not present

## 2012-02-14 DIAGNOSIS — E785 Hyperlipidemia, unspecified: Secondary | ICD-10-CM | POA: Diagnosis not present

## 2012-02-14 DIAGNOSIS — I498 Other specified cardiac arrhythmias: Secondary | ICD-10-CM | POA: Diagnosis not present

## 2012-02-14 DIAGNOSIS — I4510 Unspecified right bundle-branch block: Secondary | ICD-10-CM | POA: Diagnosis not present

## 2012-02-14 DIAGNOSIS — Z79899 Other long term (current) drug therapy: Secondary | ICD-10-CM | POA: Diagnosis not present

## 2012-02-14 DIAGNOSIS — I4891 Unspecified atrial fibrillation: Secondary | ICD-10-CM | POA: Diagnosis not present

## 2012-02-14 DIAGNOSIS — I452 Bifascicular block: Secondary | ICD-10-CM | POA: Diagnosis not present

## 2012-02-15 ENCOUNTER — Emergency Department (HOSPITAL_COMMUNITY): Payer: Medicare Other

## 2012-02-15 ENCOUNTER — Encounter (HOSPITAL_COMMUNITY): Payer: Self-pay | Admitting: Cardiology

## 2012-02-15 ENCOUNTER — Emergency Department (HOSPITAL_COMMUNITY)
Admission: EM | Admit: 2012-02-15 | Discharge: 2012-02-15 | Disposition: A | Discharge status: Home / Self Care | Payer: Medicare Other | Attending: Emergency Medicine | Admitting: Emergency Medicine

## 2012-02-15 DIAGNOSIS — R109 Unspecified abdominal pain: Secondary | ICD-10-CM | POA: Insufficient documentation

## 2012-02-15 DIAGNOSIS — E785 Hyperlipidemia, unspecified: Secondary | ICD-10-CM | POA: Insufficient documentation

## 2012-02-15 DIAGNOSIS — K2990 Gastroduodenitis, unspecified, without bleeding: Secondary | ICD-10-CM | POA: Diagnosis not present

## 2012-02-15 DIAGNOSIS — R079 Chest pain, unspecified: Secondary | ICD-10-CM

## 2012-02-15 DIAGNOSIS — R0789 Other chest pain: Secondary | ICD-10-CM | POA: Diagnosis not present

## 2012-02-15 DIAGNOSIS — D4959 Neoplasm of unspecified behavior of other genitourinary organ: Secondary | ICD-10-CM | POA: Diagnosis not present

## 2012-02-15 DIAGNOSIS — Z79899 Other long term (current) drug therapy: Secondary | ICD-10-CM | POA: Insufficient documentation

## 2012-02-15 DIAGNOSIS — I10 Essential (primary) hypertension: Secondary | ICD-10-CM | POA: Diagnosis not present

## 2012-02-15 DIAGNOSIS — R011 Cardiac murmur, unspecified: Secondary | ICD-10-CM | POA: Diagnosis not present

## 2012-02-15 DIAGNOSIS — I517 Cardiomegaly: Secondary | ICD-10-CM | POA: Diagnosis not present

## 2012-02-15 DIAGNOSIS — R0602 Shortness of breath: Secondary | ICD-10-CM | POA: Insufficient documentation

## 2012-02-15 LAB — CBC WITH DIFFERENTIAL/PLATELET
Basophils Absolute: 0 10*3/uL (ref 0.0–0.1)
Basophils Relative: 0 % (ref 0–1)
Eosinophils Absolute: 0 10*3/uL (ref 0.0–0.7)
Eosinophils Relative: 0 % (ref 0–5)
HCT: 38.7 % (ref 36.0–46.0)
Hemoglobin: 12.6 g/dL (ref 12.0–15.0)
MCH: 29.3 pg (ref 26.0–34.0)
MCHC: 32.6 g/dL (ref 30.0–36.0)
Monocytes Absolute: 0.7 10*3/uL (ref 0.1–1.0)
Monocytes Relative: 6 % (ref 3–12)
RDW: 13.7 % (ref 11.5–15.5)

## 2012-02-15 LAB — PROTIME-INR: Prothrombin Time: 16.8 seconds — ABNORMAL HIGH (ref 11.6–15.2)

## 2012-02-15 LAB — URINALYSIS, ROUTINE W REFLEX MICROSCOPIC
Ketones, ur: NEGATIVE mg/dL
Nitrite: NEGATIVE
pH: 6 (ref 5.0–8.0)

## 2012-02-15 LAB — COMPREHENSIVE METABOLIC PANEL
AST: 33 U/L (ref 0–37)
Albumin: 3.4 g/dL — ABNORMAL LOW (ref 3.5–5.2)
BUN: 19 mg/dL (ref 6–23)
Calcium: 9.4 mg/dL (ref 8.4–10.5)
Creatinine, Ser: 1.05 mg/dL (ref 0.50–1.10)
Total Bilirubin: 0.4 mg/dL (ref 0.3–1.2)
Total Protein: 6.5 g/dL (ref 6.0–8.3)

## 2012-02-15 LAB — TROPONIN I
Troponin I: <0.3 ng/mL (ref <0.30)
Troponin I: <0.3 ng/mL (ref <0.30)

## 2012-02-15 MED ORDER — IOHEXOL 350 MG/ML SOLN
100.0000 mL | Freq: Once | INTRAVENOUS | Status: AC | PRN
  Administered 2012-02-15: 100 mL via INTRAVENOUS

## 2012-02-15 MED ORDER — IOHEXOL 300 MG/ML  SOLN
20.0000 mL | INTRAMUSCULAR | Status: AC
  Administered 2012-02-15: 20 mL via ORAL

## 2012-02-15 MED ORDER — SUCRALFATE 1 G PO TABS: 1.0000 g | ORAL_TABLET | Freq: Two times a day (BID) | ORAL | Status: DC
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
## 2012-02-15 NOTE — ED Notes (Signed)
Dr. Manus Gunning at the bedside with the [place] to assess heart.

## 2012-02-15 NOTE — ED Provider Notes (Signed)
CT results reviewed and discussed with patient and with Dr. Manus Gunning.  Patient has been seen by cardiology, with plan indicated:  "discussed the case with Dr. Manus Gunning. I highly doubt this is cardiac chest pain or related to pericarditis or effusion. She had an echocardiogram in our office this week which showed no measureable pericardial fluid and a quick ultrasound in the ED confirms this. Theoretically, the other worst case scenarios are PE and dissection, which she looks way to comfortable to have, however a CTA has been ordered. I suspect this is gastritis (she has a history of gastric thickening and recently stopped her carafate). She could have gastroenteritis as well. I recommend restarting her carafate 1G BID. I would obtain 2 sets of troponins to r/o MI. If negative and her CTA does not show anything ominous, she could safely be discharged to follow-up with Dr. Allyson Sabal in a few weeks in the office."  Troponins negative x 2.  No ischemia on ECG.  Patient will be provided with prescription for carafate and discharged home.   Jimmye Norman, NP 02/16/12 [phone]

## 2012-02-15 NOTE — ED Notes (Signed)
Pt to department via EMS from home- pt reports epigastric pain that radiates to back that started this morning while she was resting. Pt vomited once prior to arrival. Bp-104/68 HR-72

## 2012-02-15 NOTE — ED Notes (Signed)
IV team at bedside 

## 2012-02-15 NOTE — ED Notes (Signed)
IV team paged for start for CT angio.  
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
## 2012-02-15 NOTE — ED Notes (Signed)
ekg given to DR.Rancour.

## 2012-02-16 NOTE — ED Provider Notes (Signed)
Medical screening examination/treatment/procedure(s) were conducted as a shared visit with non-physician practitioner(s) and myself.  I personally evaluated the patient during the encounter   Glynn Octave, MD 02/16/12 0127

## 2012-02-27 DIAGNOSIS — I4891 Unspecified atrial fibrillation: Secondary | ICD-10-CM | POA: Diagnosis not present

## 2012-03-17 DIAGNOSIS — H1130 Conjunctival hemorrhage, unspecified eye: Secondary | ICD-10-CM | POA: Diagnosis not present

## 2012-03-27 DIAGNOSIS — I4891 Unspecified atrial fibrillation: Secondary | ICD-10-CM | POA: Diagnosis not present

## 2012-03-27 DIAGNOSIS — Z0000 Encounter for general adult medical examination without abnormal findings: Secondary | ICD-10-CM | POA: Diagnosis not present

## 2012-03-27 DIAGNOSIS — I10 Essential (primary) hypertension: Secondary | ICD-10-CM | POA: Diagnosis not present

## 2012-03-27 DIAGNOSIS — Z23 Encounter for immunization: Secondary | ICD-10-CM | POA: Diagnosis not present

## 2012-03-27 DIAGNOSIS — E782 Mixed hyperlipidemia: Secondary | ICD-10-CM | POA: Diagnosis not present

## 2012-03-27 DIAGNOSIS — Z1331 Encounter for screening for depression: Secondary | ICD-10-CM | POA: Diagnosis not present

## 2012-03-31 DIAGNOSIS — H21549 Posterior synechiae (iris), unspecified eye: Secondary | ICD-10-CM | POA: Diagnosis not present

## 2012-03-31 DIAGNOSIS — C6930 Malignant neoplasm of unspecified choroid: Secondary | ICD-10-CM | POA: Diagnosis not present

## 2012-04-01 ENCOUNTER — Ambulatory Visit (INDEPENDENT_AMBULATORY_CARE_PROVIDER_SITE_OTHER): Payer: Medicare Other

## 2012-04-01 DIAGNOSIS — J309 Allergic rhinitis, unspecified: Secondary | ICD-10-CM

## 2012-04-01 DIAGNOSIS — I10 Essential (primary) hypertension: Secondary | ICD-10-CM | POA: Diagnosis not present

## 2012-04-01 DIAGNOSIS — I4891 Unspecified atrial fibrillation: Secondary | ICD-10-CM | POA: Diagnosis not present

## 2012-04-01 DIAGNOSIS — E782 Mixed hyperlipidemia: Secondary | ICD-10-CM | POA: Diagnosis not present

## 2012-04-03 DIAGNOSIS — I4891 Unspecified atrial fibrillation: Secondary | ICD-10-CM | POA: Diagnosis not present

## 2012-04-03 DIAGNOSIS — M159 Polyosteoarthritis, unspecified: Secondary | ICD-10-CM | POA: Diagnosis not present

## 2012-04-03 DIAGNOSIS — E2839 Other primary ovarian failure: Secondary | ICD-10-CM | POA: Diagnosis not present

## 2012-04-03 DIAGNOSIS — E782 Mixed hyperlipidemia: Secondary | ICD-10-CM | POA: Diagnosis not present

## 2012-04-03 DIAGNOSIS — I10 Essential (primary) hypertension: Secondary | ICD-10-CM | POA: Diagnosis not present

## 2012-04-08 ENCOUNTER — Ambulatory Visit (INDEPENDENT_AMBULATORY_CARE_PROVIDER_SITE_OTHER): Payer: Medicare Other

## 2012-04-08 DIAGNOSIS — J309 Allergic rhinitis, unspecified: Secondary | ICD-10-CM | POA: Diagnosis not present

## 2012-04-15 ENCOUNTER — Ambulatory Visit (INDEPENDENT_AMBULATORY_CARE_PROVIDER_SITE_OTHER): Payer: Medicare Other

## 2012-04-15 DIAGNOSIS — J309 Allergic rhinitis, unspecified: Secondary | ICD-10-CM

## 2012-04-17 DIAGNOSIS — I4891 Unspecified atrial fibrillation: Secondary | ICD-10-CM | POA: Diagnosis not present

## 2012-04-17 DIAGNOSIS — I4510 Unspecified right bundle-branch block: Secondary | ICD-10-CM | POA: Diagnosis not present

## 2012-04-17 DIAGNOSIS — I10 Essential (primary) hypertension: Secondary | ICD-10-CM | POA: Diagnosis not present

## 2012-04-17 DIAGNOSIS — Z79899 Other long term (current) drug therapy: Secondary | ICD-10-CM | POA: Diagnosis not present

## 2012-04-17 DIAGNOSIS — I498 Other specified cardiac arrhythmias: Secondary | ICD-10-CM | POA: Diagnosis not present

## 2012-04-17 DIAGNOSIS — E785 Hyperlipidemia, unspecified: Secondary | ICD-10-CM | POA: Diagnosis not present

## 2012-04-17 DIAGNOSIS — I119 Hypertensive heart disease without heart failure: Secondary | ICD-10-CM | POA: Diagnosis not present

## 2012-04-22 DIAGNOSIS — N189 Chronic kidney disease, unspecified: Secondary | ICD-10-CM | POA: Diagnosis not present

## 2012-04-22 DIAGNOSIS — Z950 Presence of cardiac pacemaker: Secondary | ICD-10-CM | POA: Diagnosis not present

## 2012-04-22 DIAGNOSIS — I129 Hypertensive chronic kidney disease with stage 1 through stage 4 chronic kidney disease, or unspecified chronic kidney disease: Secondary | ICD-10-CM | POA: Diagnosis not present

## 2012-04-22 DIAGNOSIS — H5440 Blindness, one eye, unspecified eye: Secondary | ICD-10-CM | POA: Diagnosis not present

## 2012-04-22 DIAGNOSIS — I498 Other specified cardiac arrhythmias: Secondary | ICD-10-CM | POA: Diagnosis not present

## 2012-04-22 DIAGNOSIS — I4891 Unspecified atrial fibrillation: Secondary | ICD-10-CM | POA: Diagnosis not present

## 2012-04-22 DIAGNOSIS — I495 Sick sinus syndrome: Secondary | ICD-10-CM | POA: Diagnosis not present

## 2012-04-22 DIAGNOSIS — E785 Hyperlipidemia, unspecified: Secondary | ICD-10-CM | POA: Diagnosis not present

## 2012-04-22 HISTORY — PX: PACEMAKER INSERTION: SHX728

## 2012-04-23 DIAGNOSIS — H5440 Blindness, one eye, unspecified eye: Secondary | ICD-10-CM | POA: Diagnosis not present

## 2012-04-23 DIAGNOSIS — R9431 Abnormal electrocardiogram [ECG] [EKG]: Secondary | ICD-10-CM | POA: Diagnosis not present

## 2012-04-23 DIAGNOSIS — E785 Hyperlipidemia, unspecified: Secondary | ICD-10-CM | POA: Diagnosis not present

## 2012-04-23 DIAGNOSIS — Z45018 Encounter for adjustment and management of other part of cardiac pacemaker: Secondary | ICD-10-CM | POA: Diagnosis not present

## 2012-04-23 DIAGNOSIS — Z9889 Other specified postprocedural states: Secondary | ICD-10-CM | POA: Diagnosis not present

## 2012-04-23 DIAGNOSIS — N189 Chronic kidney disease, unspecified: Secondary | ICD-10-CM | POA: Diagnosis not present

## 2012-04-23 DIAGNOSIS — I129 Hypertensive chronic kidney disease with stage 1 through stage 4 chronic kidney disease, or unspecified chronic kidney disease: Secondary | ICD-10-CM | POA: Diagnosis not present

## 2012-04-23 DIAGNOSIS — I4891 Unspecified atrial fibrillation: Secondary | ICD-10-CM | POA: Diagnosis not present

## 2012-04-23 DIAGNOSIS — I495 Sick sinus syndrome: Secondary | ICD-10-CM | POA: Diagnosis not present

## 2012-04-23 DIAGNOSIS — I4510 Unspecified right bundle-branch block: Secondary | ICD-10-CM | POA: Diagnosis not present

## 2012-04-30 ENCOUNTER — Ambulatory Visit (INDEPENDENT_AMBULATORY_CARE_PROVIDER_SITE_OTHER): Payer: Medicare Other

## 2012-04-30 DIAGNOSIS — J309 Allergic rhinitis, unspecified: Secondary | ICD-10-CM | POA: Diagnosis not present

## 2012-05-07 DIAGNOSIS — Z45018 Encounter for adjustment and management of other part of cardiac pacemaker: Secondary | ICD-10-CM | POA: Diagnosis not present

## 2012-05-18 ENCOUNTER — Ambulatory Visit (INDEPENDENT_AMBULATORY_CARE_PROVIDER_SITE_OTHER): Payer: Medicare Other

## 2012-05-18 DIAGNOSIS — I10 Essential (primary) hypertension: Secondary | ICD-10-CM | POA: Diagnosis not present

## 2012-05-18 DIAGNOSIS — E782 Mixed hyperlipidemia: Secondary | ICD-10-CM | POA: Diagnosis not present

## 2012-05-18 DIAGNOSIS — I4891 Unspecified atrial fibrillation: Secondary | ICD-10-CM | POA: Diagnosis not present

## 2012-05-18 DIAGNOSIS — J309 Allergic rhinitis, unspecified: Secondary | ICD-10-CM

## 2012-05-26 ENCOUNTER — Ambulatory Visit (INDEPENDENT_AMBULATORY_CARE_PROVIDER_SITE_OTHER): Payer: Medicare Other

## 2012-05-26 DIAGNOSIS — J309 Allergic rhinitis, unspecified: Secondary | ICD-10-CM

## 2012-06-02 ENCOUNTER — Ambulatory Visit: Payer: Medicare Other

## 2012-06-03 ENCOUNTER — Emergency Department (HOSPITAL_COMMUNITY): Payer: Medicare Other

## 2012-06-03 ENCOUNTER — Encounter (HOSPITAL_COMMUNITY): Payer: Self-pay | Admitting: Neurology

## 2012-06-03 ENCOUNTER — Inpatient Hospital Stay (HOSPITAL_COMMUNITY)
Admission: EM | Admit: 2012-06-03 | Discharge: 2012-06-04 | DRG: 093 | Disposition: A | Discharge status: Home / Self Care | Payer: Medicare Other | Attending: Internal Medicine | Admitting: Internal Medicine

## 2012-06-03 DIAGNOSIS — G8929 Other chronic pain: Principal | ICD-10-CM | POA: Diagnosis present

## 2012-06-03 DIAGNOSIS — J4489 Other specified chronic obstructive pulmonary disease: Secondary | ICD-10-CM | POA: Diagnosis present

## 2012-06-03 DIAGNOSIS — J449 Chronic obstructive pulmonary disease, unspecified: Secondary | ICD-10-CM | POA: Diagnosis present

## 2012-06-03 DIAGNOSIS — I4891 Unspecified atrial fibrillation: Secondary | ICD-10-CM | POA: Diagnosis present

## 2012-06-03 DIAGNOSIS — Z860100 Personal history of colon polyps, unspecified: Secondary | ICD-10-CM

## 2012-06-03 DIAGNOSIS — Z7901 Long term (current) use of anticoagulants: Secondary | ICD-10-CM

## 2012-06-03 DIAGNOSIS — R079 Chest pain, unspecified: Secondary | ICD-10-CM

## 2012-06-03 DIAGNOSIS — R1012 Left upper quadrant pain: Secondary | ICD-10-CM | POA: Diagnosis not present

## 2012-06-03 DIAGNOSIS — I509 Heart failure, unspecified: Secondary | ICD-10-CM

## 2012-06-03 DIAGNOSIS — I319 Disease of pericardium, unspecified: Secondary | ICD-10-CM

## 2012-06-03 DIAGNOSIS — E785 Hyperlipidemia, unspecified: Secondary | ICD-10-CM | POA: Diagnosis present

## 2012-06-03 DIAGNOSIS — Z888 Allergy status to other drugs, medicaments and biological substances status: Secondary | ICD-10-CM

## 2012-06-03 DIAGNOSIS — I480 Paroxysmal atrial fibrillation: Secondary | ICD-10-CM

## 2012-06-03 DIAGNOSIS — J301 Allergic rhinitis due to pollen: Secondary | ICD-10-CM

## 2012-06-03 DIAGNOSIS — I7789 Other specified disorders of arteries and arterioles: Secondary | ICD-10-CM | POA: Diagnosis present

## 2012-06-03 DIAGNOSIS — Z9089 Acquired absence of other organs: Secondary | ICD-10-CM

## 2012-06-03 DIAGNOSIS — R110 Nausea: Secondary | ICD-10-CM | POA: Diagnosis present

## 2012-06-03 DIAGNOSIS — N289 Disorder of kidney and ureter, unspecified: Secondary | ICD-10-CM

## 2012-06-03 DIAGNOSIS — I6340 Cerebral infarction due to embolism of unspecified cerebral artery: Secondary | ICD-10-CM

## 2012-06-03 DIAGNOSIS — I10 Essential (primary) hypertension: Secondary | ICD-10-CM | POA: Diagnosis present

## 2012-06-03 DIAGNOSIS — I4819 Other persistent atrial fibrillation: Secondary | ICD-10-CM

## 2012-06-03 DIAGNOSIS — K219 Gastro-esophageal reflux disease without esophagitis: Secondary | ICD-10-CM | POA: Diagnosis present

## 2012-06-03 DIAGNOSIS — Z85820 Personal history of malignant melanoma of skin: Secondary | ICD-10-CM

## 2012-06-03 DIAGNOSIS — M129 Arthropathy, unspecified: Secondary | ICD-10-CM | POA: Diagnosis present

## 2012-06-03 DIAGNOSIS — R945 Abnormal results of liver function studies: Secondary | ICD-10-CM | POA: Diagnosis present

## 2012-06-03 DIAGNOSIS — R1013 Epigastric pain: Secondary | ICD-10-CM

## 2012-06-03 DIAGNOSIS — K5730 Diverticulosis of large intestine without perforation or abscess without bleeding: Secondary | ICD-10-CM | POA: Diagnosis present

## 2012-06-03 DIAGNOSIS — I959 Hypotension, unspecified: Secondary | ICD-10-CM

## 2012-06-03 DIAGNOSIS — I452 Bifascicular block: Secondary | ICD-10-CM | POA: Diagnosis present

## 2012-06-03 DIAGNOSIS — I2510 Atherosclerotic heart disease of native coronary artery without angina pectoris: Secondary | ICD-10-CM | POA: Diagnosis not present

## 2012-06-03 DIAGNOSIS — Z87891 Personal history of nicotine dependence: Secondary | ICD-10-CM

## 2012-06-03 DIAGNOSIS — D68318 Other hemorrhagic disorder due to intrinsic circulating anticoagulants, antibodies, or inhibitors: Secondary | ICD-10-CM

## 2012-06-03 DIAGNOSIS — Z79899 Other long term (current) drug therapy: Secondary | ICD-10-CM

## 2012-06-03 DIAGNOSIS — I314 Cardiac tamponade: Secondary | ICD-10-CM

## 2012-06-03 DIAGNOSIS — I728 Aneurysm of other specified arteries: Secondary | ICD-10-CM | POA: Diagnosis present

## 2012-06-03 DIAGNOSIS — R109 Unspecified abdominal pain: Secondary | ICD-10-CM | POA: Diagnosis not present

## 2012-06-03 DIAGNOSIS — K449 Diaphragmatic hernia without obstruction or gangrene: Secondary | ICD-10-CM | POA: Diagnosis present

## 2012-06-03 DIAGNOSIS — H6980 Other specified disorders of Eustachian tube, unspecified ear: Secondary | ICD-10-CM

## 2012-06-03 DIAGNOSIS — R55 Syncope and collapse: Secondary | ICD-10-CM

## 2012-06-03 DIAGNOSIS — I4510 Unspecified right bundle-branch block: Secondary | ICD-10-CM

## 2012-06-03 DIAGNOSIS — I4811 Longstanding persistent atrial fibrillation: Secondary | ICD-10-CM | POA: Diagnosis present

## 2012-06-03 DIAGNOSIS — J309 Allergic rhinitis, unspecified: Secondary | ICD-10-CM | POA: Diagnosis present

## 2012-06-03 LAB — CBC
HCT: 38.9 % (ref 36.0–46.0)
Hemoglobin: 13.1 g/dL (ref 12.0–15.0)
MCH: 30 pg (ref 26.0–34.0)
MCV: 89 fL (ref 78.0–100.0)
RBC: 4.37 MIL/uL (ref 3.87–5.11)

## 2012-06-03 LAB — PROTIME-INR: INR: 1.11 (ref 0.00–1.49)

## 2012-06-03 LAB — COMPREHENSIVE METABOLIC PANEL
Albumin: 3 g/dL — ABNORMAL LOW (ref 3.5–5.2)
Alkaline Phosphatase: 149 U/L — ABNORMAL HIGH (ref 39–117)
BUN: 22 mg/dL (ref 6–23)
Potassium: 4.1 mEq/L (ref 3.5–5.1)
Sodium: 140 mEq/L (ref 135–145)
Total Protein: 5.5 g/dL — ABNORMAL LOW (ref 6.0–8.3)

## 2012-06-03 LAB — POCT I-STAT 3, VENOUS BLOOD GAS (G3P V)
O2 Saturation: 24 %
pCO2, Ven: 45.6 mmHg (ref 45.0–50.0)
pO2, Ven: 18 mmHg — CL (ref 30.0–45.0)

## 2012-06-03 LAB — TROPONIN I: Troponin I: <0.3 ng/mL (ref <0.30)

## 2012-06-03 LAB — URINALYSIS, ROUTINE W REFLEX MICROSCOPIC
Bilirubin Urine: NEGATIVE
Glucose, UA: NEGATIVE mg/dL
Hgb urine dipstick: NEGATIVE
Ketones, ur: NEGATIVE mg/dL
pH: 5.5 (ref 5.0–8.0)

## 2012-06-03 LAB — LIPASE, BLOOD: Lipase: 52 U/L (ref 11–59)

## 2012-06-03 LAB — APTT: aPTT: 35 seconds (ref 24–37)

## 2012-06-03 MED ORDER — ONDANSETRON HCL 4 MG PO TABS
4.0000 mg | ORAL_TABLET | Freq: Four times a day (QID) | ORAL | Status: DC | PRN
  Filled 2012-06-03 (×3): qty 0.5

## 2012-06-03 MED ORDER — OMEPRAZOLE 20 MG PO CPDR
20.0000 mg | DELAYED_RELEASE_CAPSULE | Freq: Every day | ORAL | Status: DC
  Administered 2012-06-04: 20 mg via ORAL
  Filled 2012-06-03: qty 1

## 2012-06-03 MED ORDER — METOPROLOL SUCCINATE ER 25 MG PO TB24
25.0000 mg | ORAL_TABLET | Freq: Two times a day (BID) | ORAL | Status: DC
  Administered 2012-06-03 – 2012-06-04 (×2): 25 mg via ORAL
  Filled 2012-06-03 (×3): qty 1

## 2012-06-03 MED ORDER — LORATADINE 10 MG PO TABS
10.0000 mg | ORAL_TABLET | Freq: Every day | ORAL | Status: DC | PRN
  Filled 2012-06-03: qty 1

## 2012-06-03 MED ORDER — IOHEXOL 300 MG/ML  SOLN
100.0000 mL | INTRAMUSCULAR | Status: AC
  Administered 2012-06-03: 80 mL via INTRAVENOUS

## 2012-06-03 MED ORDER — IOHEXOL 300 MG/ML  SOLN
20.0000 mL | INTRAMUSCULAR | Status: AC
Start: 2012-06-03 — End: 2012-06-03
  Administered 2012-06-03: 20 mL via ORAL

## 2012-06-03 MED ORDER — OMEGA-3-ACID ETHYL ESTERS 1 G PO CAPS
1.0000 g | ORAL_CAPSULE | Freq: Every day | ORAL | Status: DC
  Administered 2012-06-04: 1 g via ORAL
  Filled 2012-06-03: qty 1

## 2012-06-03 MED ORDER — SODIUM CHLORIDE 0.9 % IV BOLUS (SEPSIS)
2000.0000 mL | Freq: Once | INTRAVENOUS | Status: AC
  Administered 2012-06-03: 2000 mL via INTRAVENOUS

## 2012-06-03 MED ORDER — ADULT MULTIVITAMIN W/MINERALS CH
1.0000 | ORAL_TABLET | ORAL | Status: DC
  Administered 2012-06-04: 1 via ORAL
  Filled 2012-06-03 (×2): qty 1

## 2012-06-03 MED ORDER — SODIUM CHLORIDE 0.9 % IJ SOLN
3.0000 mL | Freq: Two times a day (BID) | INTRAMUSCULAR | Status: DC
  Administered 2012-06-03: 3 mL via INTRAVENOUS

## 2012-06-03 MED ORDER — DABIGATRAN ETEXILATE MESYLATE 75 MG PO CAPS
75.0000 mg | ORAL_CAPSULE | Freq: Two times a day (BID) | ORAL | Status: DC
  Administered 2012-06-03 – 2012-06-04 (×2): 75 mg via ORAL
  Filled 2012-06-03 (×3): qty 1

## 2012-06-03 MED ORDER — LOTEPREDNOL ETABONATE 0.5 % OP SUSP
1.0000 [drp] | Freq: Two times a day (BID) | OPHTHALMIC | Status: DC
  Administered 2012-06-03 – 2012-06-04 (×2): 1 [drp] via OPHTHALMIC
  Filled 2012-06-03: qty 5

## 2012-06-03 MED ORDER — DOFETILIDE 250 MCG PO CAPS
250.0000 ug | ORAL_CAPSULE | Freq: Two times a day (BID) | ORAL | Status: DC
  Administered 2012-06-03 – 2012-06-04 (×2): 250 ug via ORAL
  Filled 2012-06-03 (×3): qty 1

## 2012-06-03 MED ORDER — PANTOPRAZOLE SODIUM 40 MG PO TBEC: 40.0000 mg | DELAYED_RELEASE_TABLET | Freq: Every day | ORAL | Status: DC

## 2012-06-03 MED ORDER — HYDROMORPHONE HCL PF 1 MG/ML IJ SOLN
0.5000 mg | Freq: Once | INTRAMUSCULAR | Status: AC
  Administered 2012-06-03: 0.5 mg via INTRAVENOUS
  Filled 2012-06-03 (×2): qty 1

## 2012-06-03 MED ORDER — HYDROMORPHONE HCL PF 1 MG/ML IJ SOLN: 1.0000 mg | INTRAMUSCULAR | Status: DC | PRN

## 2012-06-03 MED ORDER — MAGNESIUM OXIDE 400 MG PO TABS
400.0000 mg | ORAL_TABLET | Freq: Every day | ORAL | Status: DC
  Administered 2012-06-04: 400 mg via ORAL
  Filled 2012-06-03: qty 1

## 2012-06-03 MED ORDER — SUCRALFATE 1 G PO TABS
1.0000 g | ORAL_TABLET | Freq: Two times a day (BID) | ORAL | Status: DC
  Administered 2012-06-03 – 2012-06-04 (×2): 1 g via ORAL
  Filled 2012-06-03 (×3): qty 1

## 2012-06-03 MED ORDER — POTASSIUM CHLORIDE CRYS ER 20 MEQ PO TBCR
20.0000 meq | EXTENDED_RELEASE_TABLET | Freq: Every day | ORAL | Status: DC
  Administered 2012-06-04: 20 meq via ORAL
  Filled 2012-06-03: qty 1

## 2012-06-03 MED ORDER — ATORVASTATIN CALCIUM 10 MG PO TABS
10.0000 mg | ORAL_TABLET | Freq: Every day | ORAL | Status: DC
  Filled 2012-06-03: qty 1

## 2012-06-03 MED ORDER — ONDANSETRON HCL 4 MG/2ML IJ SOLN: 4.0000 mg | Freq: Four times a day (QID) | INTRAMUSCULAR | Status: DC | PRN

## 2012-06-03 MED ORDER — PANTOPRAZOLE SODIUM 40 MG IV SOLR
40.0000 mg | Freq: Once | INTRAVENOUS | Status: DC
  Filled 2012-06-03: qty 40

## 2012-06-03 MED ORDER — DEXTROSE-NACL 5-0.9 % IV SOLN
INTRAVENOUS | Status: DC
  Administered 2012-06-03: 50 mL/h via INTRAVENOUS

## 2012-06-03 NOTE — ED Notes (Signed)
Pt refusing protonix and dilaudid. EDP made aware. Pt denies any pain.

## 2012-06-03 NOTE — ED Notes (Signed)
Per ems- Pt comes from home, just finished eating breakfast developed severe RUQ and LUQ abdominal pain. Hx of such. States gallbladder removed, could have gallstones at irritation at bile duct. Pacemaker placed in December, in paced rhythm. No n/v. Pain is intermittent. BP 149/79, HR 76, RR 18. CBG 81
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
## 2012-06-03 NOTE — ED Notes (Signed)
Patient transported to X-ray 

## 2012-06-03 NOTE — ED Notes (Signed)
Pt denies any pain at current but pain is intermittent. When pain comes, feels in RUQ and LUQ. Pt reports today ate bacon and eggs then developed "severe" pain.

## 2012-06-04 DIAGNOSIS — I4891 Unspecified atrial fibrillation: Secondary | ICD-10-CM | POA: Diagnosis present

## 2012-06-04 DIAGNOSIS — R1013 Epigastric pain: Secondary | ICD-10-CM

## 2012-06-04 DIAGNOSIS — I7789 Other specified disorders of arteries and arterioles: Secondary | ICD-10-CM | POA: Diagnosis present

## 2012-06-04 DIAGNOSIS — Z86010 Personal history of colonic polyps: Secondary | ICD-10-CM | POA: Diagnosis not present

## 2012-06-04 DIAGNOSIS — E785 Hyperlipidemia, unspecified: Secondary | ICD-10-CM | POA: Diagnosis present

## 2012-06-04 DIAGNOSIS — K449 Diaphragmatic hernia without obstruction or gangrene: Secondary | ICD-10-CM | POA: Diagnosis present

## 2012-06-04 DIAGNOSIS — I728 Aneurysm of other specified arteries: Secondary | ICD-10-CM | POA: Diagnosis present

## 2012-06-04 DIAGNOSIS — J449 Chronic obstructive pulmonary disease, unspecified: Secondary | ICD-10-CM | POA: Diagnosis present

## 2012-06-04 DIAGNOSIS — M129 Arthropathy, unspecified: Secondary | ICD-10-CM | POA: Diagnosis present

## 2012-06-04 DIAGNOSIS — Z79899 Other long term (current) drug therapy: Secondary | ICD-10-CM | POA: Diagnosis not present

## 2012-06-04 DIAGNOSIS — Z85820 Personal history of malignant melanoma of skin: Secondary | ICD-10-CM | POA: Diagnosis not present

## 2012-06-04 DIAGNOSIS — Z87891 Personal history of nicotine dependence: Secondary | ICD-10-CM | POA: Diagnosis not present

## 2012-06-04 DIAGNOSIS — G8929 Other chronic pain: Secondary | ICD-10-CM | POA: Diagnosis present

## 2012-06-04 DIAGNOSIS — I2510 Atherosclerotic heart disease of native coronary artery without angina pectoris: Secondary | ICD-10-CM | POA: Diagnosis present

## 2012-06-04 DIAGNOSIS — J309 Allergic rhinitis, unspecified: Secondary | ICD-10-CM | POA: Diagnosis present

## 2012-06-04 DIAGNOSIS — Z9089 Acquired absence of other organs: Secondary | ICD-10-CM | POA: Diagnosis not present

## 2012-06-04 DIAGNOSIS — I452 Bifascicular block: Secondary | ICD-10-CM | POA: Diagnosis present

## 2012-06-04 DIAGNOSIS — R109 Unspecified abdominal pain: Secondary | ICD-10-CM | POA: Diagnosis not present

## 2012-06-04 DIAGNOSIS — Z7901 Long term (current) use of anticoagulants: Secondary | ICD-10-CM | POA: Diagnosis not present

## 2012-06-04 DIAGNOSIS — I10 Essential (primary) hypertension: Secondary | ICD-10-CM | POA: Diagnosis present

## 2012-06-04 DIAGNOSIS — K219 Gastro-esophageal reflux disease without esophagitis: Secondary | ICD-10-CM | POA: Diagnosis present

## 2012-06-04 DIAGNOSIS — K5730 Diverticulosis of large intestine without perforation or abscess without bleeding: Secondary | ICD-10-CM | POA: Diagnosis present

## 2012-06-04 DIAGNOSIS — Z888 Allergy status to other drugs, medicaments and biological substances status: Secondary | ICD-10-CM | POA: Diagnosis not present

## 2012-06-04 DIAGNOSIS — R945 Abnormal results of liver function studies: Secondary | ICD-10-CM | POA: Diagnosis present

## 2012-06-04 LAB — URINE CULTURE
Colony Count: NO GROWTH
Culture: NO GROWTH

## 2012-06-04 LAB — TROPONIN I: Troponin I: <0.3 ng/mL (ref <0.30)

## 2012-06-04 NOTE — Progress Notes (Signed)
Transported to main lobby via wheelchair per [employer]. [name]

## 2012-06-04 NOTE — Progress Notes (Signed)
[facility] PROGRESS NOTE Assessment/Plan: Chronic ABDOMINAL PAIN-EPIGASTRIC (10/25/2009) - LFT's WNL. - cardiac markers negative. - wants to advance diet - Chronic intermittent abdominal pain of unclear cause. She generally gets better without any intervention. I noted her elevated LFTs and that she has been on statin, but she said this is not new for her as well - Gi consult, [place] - Bm last night - d/w with PA she relates to follow up with [facility] outpatient.  Abnormal LFT's: - check in am tolerating diet - Billi with in normal. Hold statin  GERD: - PPI.  Code Status: full Family Communication:  family  Disposition Plan: home in am.   Consultants:  None  Procedures:  None  Antibiotics:  None  HPI/Subjective: No complains tolerating diet.  Objective: Filed Vitals:   06/03/12 1910 06/03/12 2121 06/03/12 2211 06/04/12 0622  BP: 152/56 137/96 153/82 137/64  Pulse:  70 73 71  Temp:  98.4 F (36.9 C) 97.9 F (36.6 C) 98 F (36.7 C)  TempSrc:  Oral Oral Oral  Resp: 16 20 20 20   Height:    5\' 3"  (1.6 m)  Weight:    67.5 kg (148 lb 13 oz)  SpO2: 99% 99% 95% 96%    Intake/Output Summary (Last 24 hours) at 06/04/12 1038 Last data filed at 06/04/12 0730  Gross per 24 hour  Intake    243 ml  Output    650 ml  Net   -407 ml   Filed Weights   06/04/12 0622  Weight: 67.5 kg (148 lb 13 oz)    Exam:  General: Alert, awake, oriented x3, in no acute distress.  HEENT: No bruits, no goiter.  Heart: Regular rate and rhythm, without murmurs, rubs, gallops.  Lungs: Good air movement, clear to auscultation.  Abdomen: Soft, nontender, nondistended, positive bowel sounds.  Neuro: Grossly intact, nonfocal.   Data Reviewed: Basic Metabolic Panel:  Lab 06/03/12 2130  NA 140  K 4.1  CL 106  CO2 21  GLUCOSE 91  BUN 22  CREATININE 1.03  CALCIUM 8.1*  MG --  PHOS --   Liver Function Tests:  Lab 06/03/12 1356  AST 307*  ALT 134*  ALKPHOS 149*    BILITOT 1.1  PROT 5.5*  ALBUMIN 3.0*    Lab 06/03/12 1356  LIPASE 52  AMYLASE --   No results found for this basename: AMMONIA:5 in the last 168 hours CBC:  Lab 06/03/12 2312  WBC 9.2  NEUTROABS --  HGB 13.1  HCT 38.9  MCV 89.0  PLT 132*   Cardiac Enzymes:  Lab 06/04/12 0408 06/03/12 2312 06/03/12 1357  CKTOTAL -- -- --  CKMB -- -- --  CKMBINDEX -- -- --  TROPONINI <0.30 <0.30 <0.30   BNP (last 3 results)  Basename 01/20/12 0530 01/16/12 2018 09/04/11 0945  PROBNP 1368.0* 2298.0* 1553.0*   CBG: No results found for this basename: GLUCAP:5 in the last 168 hours  No results found for this or any previous visit (from the past 240 hour(s)).   Studies: US Abdomen Complete  06/03/2012  *RADIOLOGY REPORT*  Clinical Data:  79 year old with abdominal pain.  COMPLETE ABDOMINAL ULTRASOUND  Comparison:  CT examination 02/15/2012  Findings:  Gallbladder:  Gallbladder has been removed.  Common bile duct:  Common bile duct measures up to 1.1 cm.  This is similar to the previous CT examination.  Liver:  No focal lesion identified.  Within normal limits in parenchymal echogenicity.  IVC:  Appears normal.  Pancreas:  No focal abnormality seen.  Spleen:  Spleen measures 5.3 cm in length.  No gross abnormality.  Right Kidney:  Right kidney measures 9.5 cm in length without hydronephrosis.  Left Kidney:  Left kidney measures 9.6 cm in length without hydronephrosis.  Abdominal aorta:  No aneurysm identified.  IMPRESSION: Negative abdominal ultrasound.  Post cholecystectomy.  Stable size of the common bile duct.   Original Report Authenticated By: Richarda Overlie, M.D.    Ct Abdomen Pelvis W Contrast  06/03/2012  *RADIOLOGY REPORT*  Clinical Data: 79 year old with abdominal pain.  CT ABDOMEN AND PELVIS WITH CONTRAST  Technique:  Multidetector CT imaging of the abdomen and pelvis was performed following the standard protocol during bolus administration of intravenous contrast.  Contrast: 100 ml  Omnipaque-300  Comparison: 02/15/2012 and abdominal ultrasound 06/03/2012  Findings: Lung bases are essentially clear.  Cardiac pacemaker is present.  There is no evidence for free intraperitoneal air.  There is a stable 2.5 cm round density in the left paraspinal region posterior to the descending thoracic aorta. The structure is low density and most likely represents a dilated nerve sheath.  Postsurgical changes consistent with cholecystectomy.  Again noted are multiple embolization coils within the liver and porta hepatis region.  There is a small amount of pneumobilia consistent with previous biliary intervention.  No focal abnormality within the liver.  Limited evaluation of the portal venous system due to the embolization coils.  Common bile duct is prominent, measuring 1.1 cm but unchanged.  Normal appearance of the pancreas, spleen and adrenal glands. Again noted is an exophytic fatty structure along the inferior posterior left kidney.  Findings are compatible with fat necrosis from the previous percutaneous ablation.  There may be a small cyst adjacent to ablation site that was likely present on the previous examination but may be slightly enlarged.  This area roughly measures 8 mm.  No other suspicious renal findings.  There is a small hiatal hernia.  Aorta is heavily calcified and measures up to 2.8 cm which is unchanged.  No significant free fluid or lymphadenopathy.  No gross abnormality to the uterus or adnexal structures.  There is diverticulosis in the sigmoid colon without acute colonic inflammation.  No evidence for bowel dilatation or obstruction. Old right inferior pubic rami fracture.  There is mild wall thickening in the distal stomach which is nonspecific.  IMPRESSION: No acute abnormalities within the abdomen or pelvis.  Post ablation changes in the left kidney lower pole.  There is a sub centimeter low density area adjacent to the ablation site which could represent a small cyst.  Recommend  attention to this area on followup imaging.  Postsurgical and postprocedure changes in the liver.  Mild wall thickening in the distal stomach is nonspecific.    This finding could be related to under distention.   Original Report Authenticated By: Richarda Overlie, M.D.    Dg Abd Acute W/chest  06/03/2012  *RADIOLOGY REPORT*  Clinical Data: Shortness of breath, abdominal pain.  ACUTE ABDOMEN SERIES (ABDOMEN 2 VIEW & CHEST 1 VIEW)  Comparison: Chest x-ray 02/15/2012.  Findings: Left pacer is in place with leads in the right atrium and right ventricle, new since prior study. There is hyperinflation of the lungs compatible with COPD.  Heart is normal size.  No focal opacities or effusions.  Nonobstructive bowel gas pattern.  No free air.  No organomegaly or suspicious calcification.  Angiographic coils noted in the upper abdomen.  Prior cholecystectomy.  IMPRESSION: No  evidence of bowel obstruction or free air.  No acute findings.  COPD.   Original Report Authenticated By: Charlett Nose, M.D.     Scheduled Meds:    . atorvastatin  10 mg Oral q1800  . dabigatran  75 mg Oral Q12H  . dofetilide  250 mcg Oral BID  . loteprednol  1 drop Both Eyes BID  . magnesium oxide  400 mg Oral Daily  . metoprolol succinate  25 mg Oral BID  . multivitamin with minerals  1 tablet Oral [id number]  . omega-3 acid ethyl esters  1 g Oral Daily  . omeprazole  20 mg Oral Daily  . potassium chloride SA  20 mEq Oral Daily  . sodium chloride  3 mL Intravenous Q12H  . sucralfate  1 g Oral BID   Continuous Infusions:    . dextrose 5 % and 0.9% NaCl 50 mL/hr (06/03/12 2329)     Marinda Elk  [facility] Pager [phone]. If 8PM-8AM, please contact night-coverage at [email or web address], password [facility] 06/04/2012, 10:38 AM  LOS: 1 day

## 2012-06-04 NOTE — Discharge Summary (Signed)
Physician Discharge Summary  [name] ZOX:[id number] DOB: 1925-07-22 DOA: 06/03/2012  PCP: [name], MD  Admit date: 06/03/2012 Discharge date: 06/04/2012  Time spent: 35 minutes  Recommendations for Outpatient Follow-up:  1. Follow up with Dr. [name] (include homehealth, outpatient follow-up instructions, specific recommendations for PCP to follow-up on, etc.)  Discharge Diagnoses:  Principal Problem:  *ABDOMINAL PAIN-EPIGASTRIC Active Problems:  Aneurysm of other visceral artery  GERD  DIVERTICULOSIS, COLON  NAUSEA  Right bundle branch block and left anterior fascicular block  PAF, hx of RFA at [facility] complicated by tamponade   Discharge Condition: stable  Diet recommendation: regular  Filed Weights   06/04/12 0622  Weight: 67.5 kg (148 lb 13 oz)    History of present illness:  79 y.o. female with hx of CAD, chronic afib on Pradaxa, S/P CCY, GERD, diverticulosis, COPD, Hx of cardiac tamponade, presents with acute and severe epigastric pain this morning. This pain is actually not new to her. She has intermittent severe epigastric pain for years, since after her CCY, and usually lasted a day or two. She said when she has the pain, her total bilirubin would elevated. She has had GI work up prior, and it was not clear the exact cause. She has been given intermittent narcotics for these abdominal pain episodes which she uses sparingly and definitely not daily. She denied any chest pain, nausea, vomiting, black or bloody stool, fever, or chills. In the ER, she was given a small dose of dilaudid and did well. It should be noted that upon arrival to the ER, her SBP was 80, and IVF given, with resulting BP of 110. Work up in the ER included an EKG without any changes from prior, normal Lipase, nornal renal fx tests and electrolytes, but she has elevations of her LFTs. A RUQ US showed normal CBD. An abdominal and pelvis CT with contrast showed no acute process. Hospitalist was  asked to admit her for OBS, to make sure her abdominal pain resolved, for symptomatic treatment, and because her elderly husband can't take care of her while she is ill. I noticed there were no CBC done.   Hospital Course:  Chronic ABDOMINAL PAIN-EPIGASTRIC (10/25/2009) - cardiac markers negative.  - Chronic intermittent abdominal pain of unclear cause. She generally gets better without any intervention. I noted her elevated LFTs and that she has been on statin, this were hold. - advance diet, which was done and she tolerate this well. She wanted to be d/c on 1.23.2014 as she had an appointment.  - Bm last night  - d/w with PA she relates to follow up with [facility] outpatient. - she did not require any further narcotics.  Abnormal LFT's:  - check in am tolerating diet  - Billi with in normal. Hold statin  - follow up with GI.  GERD: - PPI.   Procedures:  none (i.e. Studies not automatically included, echos, thoracentesis, etc; not x-rays)  Consultations:  none  Discharge Exam: Filed Vitals:   06/03/12 1910 06/03/12 2121 06/03/12 2211 06/04/12 0622  BP: 152/56 137/96 153/82 137/64  Pulse:  70 73 71  Temp:  98.4 F (36.9 C) 97.9 F (36.6 C) 98 F (36.7 C)  TempSrc:  Oral Oral Oral  Resp: 16 20 20 20   Height:    5\' 3"  (1.6 m)  Weight:    67.5 kg (148 lb 13 oz)  SpO2: 99% 99% 95% 96%    General: A&O x3 Cardiovascular: RRR Respiratory: good air movement CTA B/L  Discharge Instructions  Discharge Orders    Future Appointments: Provider: Department: Dept Phone: Center:   06/22/2012 2:15 PM [name], MD [facility] [phone] [facility]     Future Orders Please Complete By Expires   Diet - low sodium heart healthy      Increase activity slowly          Medication List     As of 06/04/2012 10:59 AM    STOP taking these medications         rosuvastatin 5 MG tablet   Commonly known as: CRESTOR      TAKE these medications          CALCIUM + D PO   Take 1 tablet by mouth 2 (two) times daily.      colchicine 0.6 MG tablet   Take 1 tablet (0.6 mg total) by mouth daily.      dabigatran 75 MG Caps   Commonly known as: PRADAXA   Take 75 mg by mouth every 12 (twelve) hours.      dofetilide 250 MCG capsule   Commonly known as: TIKOSYN   Take 250 mcg by mouth 2 (two) times daily. Schedule is 8am and 8pm and cannot vary more than 1 hour      Fish Oil 1000 MG Caps   Take 1 capsule by mouth 2 (two) times daily.      furosemide 40 MG tablet   Commonly known as: LASIX   Take 40 mg by mouth daily.      loratadine 10 MG tablet   Commonly known as: CLARITIN   Take 10 mg by mouth daily as needed. For allergy symptoms      loteprednol 0.5 % ophthalmic suspension   Commonly known as: LOTEMAX   Place 1 drop into both eyes 2 (two) times daily.      magnesium oxide 400 MG tablet   Commonly known as: MAG-OX   Take 400 mg by mouth daily.      metoprolol succinate 25 MG 24 hr tablet   Commonly known as: TOPROL-XL   Take 25 mg by mouth 2 (two) times daily.      multivitamins ther. w/minerals Tabs   Take 1 tablet by mouth every morning.      omeprazole 20 MG capsule   Commonly known as: PRILOSEC   Take 20 mg by mouth daily.      potassium chloride SA 20 MEQ tablet   Commonly known as: K-DUR,KLOR-CON   Take 20 mEq by mouth daily.      sucralfate 1 G tablet   Commonly known as: CARAFATE   Take 1 g by mouth 2 (two) times daily.          The results of significant diagnostics from this hospitalization (including imaging, microbiology, ancillary and laboratory) are listed below for reference.    Significant Diagnostic Studies: US Abdomen Complete  06/03/2012  *RADIOLOGY REPORT*  Clinical Data:  79 year old with abdominal pain.  COMPLETE ABDOMINAL ULTRASOUND  Comparison:  CT examination 02/15/2012  Findings:  Gallbladder:  Gallbladder has been removed.  Common bile duct:  Common bile duct measures up to 1.1 cm.   This is similar to the previous CT examination.  Liver:  No focal lesion identified.  Within normal limits in parenchymal echogenicity.  IVC:  Appears normal.  Pancreas:  No focal abnormality seen.  Spleen:  Spleen measures 5.3 cm in length.  No gross abnormality.  Right Kidney:  Right kidney measures 9.5  cm in length without hydronephrosis.  Left Kidney:  Left kidney measures 9.6 cm in length without hydronephrosis.  Abdominal aorta:  No aneurysm identified.  IMPRESSION: Negative abdominal ultrasound.  Post cholecystectomy.  Stable size of the common bile duct.   Original Report Authenticated By: Richarda Overlie, M.D.    Ct Abdomen Pelvis W Contrast  06/03/2012  *RADIOLOGY REPORT*  Clinical Data: 79 year old with abdominal pain.  CT ABDOMEN AND PELVIS WITH CONTRAST  Technique:  Multidetector CT imaging of the abdomen and pelvis was performed following the standard protocol during bolus administration of intravenous contrast.  Contrast: 100 ml Omnipaque-300  Comparison: 02/15/2012 and abdominal ultrasound 06/03/2012  Findings: Lung bases are essentially clear.  Cardiac pacemaker is present.  There is no evidence for free intraperitoneal air.  There is a stable 2.5 cm round density in the left paraspinal region posterior to the descending thoracic aorta. The structure is low density and most likely represents a dilated nerve sheath.  Postsurgical changes consistent with cholecystectomy.  Again noted are multiple embolization coils within the liver and porta hepatis region.  There is a small amount of pneumobilia consistent with previous biliary intervention.  No focal abnormality within the liver.  Limited evaluation of the portal venous system due to the embolization coils.  Common bile duct is prominent, measuring 1.1 cm but unchanged.  Normal appearance of the pancreas, spleen and adrenal glands. Again noted is an exophytic fatty structure along the inferior posterior left kidney.  Findings are compatible with fat  necrosis from the previous percutaneous ablation.  There may be a small cyst adjacent to ablation site that was likely present on the previous examination but may be slightly enlarged.  This area roughly measures 8 mm.  No other suspicious renal findings.  There is a small hiatal hernia.  Aorta is heavily calcified and measures up to 2.8 cm which is unchanged.  No significant free fluid or lymphadenopathy.  No gross abnormality to the uterus or adnexal structures.  There is diverticulosis in the sigmoid colon without acute colonic inflammation.  No evidence for bowel dilatation or obstruction. Old right inferior pubic rami fracture.  There is mild wall thickening in the distal stomach which is nonspecific.  IMPRESSION: No acute abnormalities within the abdomen or pelvis.  Post ablation changes in the left kidney lower pole.  There is a sub centimeter low density area adjacent to the ablation site which could represent a small cyst.  Recommend attention to this area on followup imaging.  Postsurgical and postprocedure changes in the liver.  Mild wall thickening in the distal stomach is nonspecific.    This finding could be related to under distention.   Original Report Authenticated By: Richarda Overlie, M.D.    Dg Abd Acute W/chest  06/03/2012  *RADIOLOGY REPORT*  Clinical Data: Shortness of breath, abdominal pain.  ACUTE ABDOMEN SERIES (ABDOMEN 2 VIEW & CHEST 1 VIEW)  Comparison: Chest x-ray 02/15/2012.  Findings: Left pacer is in place with leads in the right atrium and right ventricle, new since prior study. There is hyperinflation of the lungs compatible with COPD.  Heart is normal size.  No focal opacities or effusions.  Nonobstructive bowel gas pattern.  No free air.  No organomegaly or suspicious calcification.  Angiographic coils noted in the upper abdomen.  Prior cholecystectomy.  IMPRESSION: No evidence of bowel obstruction or free air.  No acute findings.  COPD.   Original Report Authenticated By: Charlett Nose, M.D.     Microbiology:  No results found for this or any previous visit (from the past 240 hour(s)).   Labs: Basic Metabolic Panel:  Lab 06/03/12 6213  NA 140  K 4.1  CL 106  CO2 21  GLUCOSE 91  BUN 22  CREATININE 1.03  CALCIUM 8.1*  MG --  PHOS --   Liver Function Tests:  Lab 06/03/12 1356  AST 307*  ALT 134*  ALKPHOS 149*  BILITOT 1.1  PROT 5.5*  ALBUMIN 3.0*    Lab 06/03/12 1356  LIPASE 52  AMYLASE --   No results found for this basename: AMMONIA:5 in the last 168 hours CBC:  Lab 06/03/12 2312  WBC 9.2  NEUTROABS --  HGB 13.1  HCT 38.9  MCV 89.0  PLT 132*   Cardiac Enzymes:  Lab 06/04/12 0408 06/03/12 2312 06/03/12 1357  CKTOTAL -- -- --  CKMB -- -- --  CKMBINDEX -- -- --  TROPONINI <0.30 <0.30 <0.30   BNP: BNP (last 3 results)  Basename 01/20/12 0530 01/16/12 2018 09/04/11 0945  PROBNP 1368.0* 2298.0* 1553.0*   CBG: No results found for this basename: GLUCAP:5 in the last 168 hours     Signed:  Marinda Elk  [facility] 06/04/2012, 10:59 AM

## 2012-06-04 NOTE — Progress Notes (Signed)
Discharge instructions along with medlist provided. Awaiting transport to main lobby for d/c home. Mamie Levers

## 2012-06-05 DIAGNOSIS — I495 Sick sinus syndrome: Secondary | ICD-10-CM | POA: Diagnosis not present

## 2012-06-05 DIAGNOSIS — I4510 Unspecified right bundle-branch block: Secondary | ICD-10-CM | POA: Diagnosis not present

## 2012-06-05 DIAGNOSIS — I452 Bifascicular block: Secondary | ICD-10-CM | POA: Diagnosis not present

## 2012-06-05 DIAGNOSIS — I499 Cardiac arrhythmia, unspecified: Secondary | ICD-10-CM | POA: Diagnosis not present

## 2012-06-05 DIAGNOSIS — I4891 Unspecified atrial fibrillation: Secondary | ICD-10-CM | POA: Diagnosis not present

## 2012-06-08 ENCOUNTER — Ambulatory Visit (INDEPENDENT_AMBULATORY_CARE_PROVIDER_SITE_OTHER): Payer: Medicare Other

## 2012-06-08 DIAGNOSIS — J309 Allergic rhinitis, unspecified: Secondary | ICD-10-CM

## 2012-06-09 ENCOUNTER — Ambulatory Visit: Payer: Medicare Other

## 2012-06-09 DIAGNOSIS — I10 Essential (primary) hypertension: Secondary | ICD-10-CM | POA: Diagnosis not present

## 2012-06-09 DIAGNOSIS — I4891 Unspecified atrial fibrillation: Secondary | ICD-10-CM | POA: Diagnosis not present

## 2012-06-09 DIAGNOSIS — E782 Mixed hyperlipidemia: Secondary | ICD-10-CM | POA: Diagnosis not present

## 2012-06-10 LAB — CULTURE, BLOOD (ROUTINE X 2): Culture: NO GROWTH

## 2012-06-16 ENCOUNTER — Ambulatory Visit (INDEPENDENT_AMBULATORY_CARE_PROVIDER_SITE_OTHER): Payer: Medicare Other

## 2012-06-16 ENCOUNTER — Ambulatory Visit: Payer: Medicare Other

## 2012-06-16 DIAGNOSIS — J309 Allergic rhinitis, unspecified: Secondary | ICD-10-CM

## 2012-06-22 ENCOUNTER — Other Ambulatory Visit: Payer: Medicare Other

## 2012-06-22 ENCOUNTER — Ambulatory Visit (INDEPENDENT_AMBULATORY_CARE_PROVIDER_SITE_OTHER): Payer: Medicare Other | Admitting: Gastroenterology

## 2012-06-22 ENCOUNTER — Encounter: Payer: Self-pay | Admitting: Gastroenterology

## 2012-06-22 ENCOUNTER — Ambulatory Visit (INDEPENDENT_AMBULATORY_CARE_PROVIDER_SITE_OTHER): Payer: Medicare Other

## 2012-06-22 VITALS — BP 120/68 | HR 64 | Ht 62.0 in | Wt 144.2 lb

## 2012-06-22 DIAGNOSIS — R945 Abnormal results of liver function studies: Secondary | ICD-10-CM

## 2012-06-22 DIAGNOSIS — R109 Unspecified abdominal pain: Secondary | ICD-10-CM

## 2012-06-22 DIAGNOSIS — R1013 Epigastric pain: Secondary | ICD-10-CM

## 2012-06-22 DIAGNOSIS — J309 Allergic rhinitis, unspecified: Secondary | ICD-10-CM

## 2012-06-22 DIAGNOSIS — R7989 Other specified abnormal findings of blood chemistry: Secondary | ICD-10-CM

## 2012-06-22 LAB — HEPATIC FUNCTION PANEL
ALT: 27 U/L (ref 0–35)
AST: 33 U/L (ref 0–37)
Albumin: 4 g/dL (ref 3.5–5.2)

## 2012-06-22 NOTE — Patient Instructions (Addendum)
You will go to the basement today for labs

## 2012-06-22 NOTE — Assessment & Plan Note (Addendum)
Recurrent, transient abdominal pain with elevated LFTs are most likely due to transient vascular insufficiency. Her last episode was exactly as her previous episodes. With history of paroxysmal atrial fibrillation embolic disease is a concern.  There was no evidence by CT for aneurysmal dissection.  Recommendations #1 followup LFTs #2 should she develop more frequent episodes I would consider arteriography for further delineation of her vascular anatomy #3 restart lowering medications if LFTs have normalized
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
## 2012-06-23 ENCOUNTER — Ambulatory Visit: Payer: Medicare Other

## 2012-06-23 DIAGNOSIS — H04129 Dry eye syndrome of unspecified lacrimal gland: Secondary | ICD-10-CM | POA: Diagnosis not present

## 2012-06-24 ENCOUNTER — Encounter: Payer: Self-pay | Admitting: Internal Medicine

## 2012-06-30 ENCOUNTER — Ambulatory Visit: Payer: Medicare Other

## 2012-06-30 ENCOUNTER — Ambulatory Visit (INDEPENDENT_AMBULATORY_CARE_PROVIDER_SITE_OTHER): Payer: Medicare Other

## 2012-06-30 DIAGNOSIS — J309 Allergic rhinitis, unspecified: Secondary | ICD-10-CM

## 2012-07-07 ENCOUNTER — Ambulatory Visit (INDEPENDENT_AMBULATORY_CARE_PROVIDER_SITE_OTHER): Payer: Medicare Other

## 2012-07-07 ENCOUNTER — Ambulatory Visit: Payer: Medicare Other

## 2012-07-07 DIAGNOSIS — J309 Allergic rhinitis, unspecified: Secondary | ICD-10-CM | POA: Diagnosis not present

## 2012-07-14 ENCOUNTER — Ambulatory Visit: Payer: Medicare Other

## 2012-07-14 ENCOUNTER — Ambulatory Visit (INDEPENDENT_AMBULATORY_CARE_PROVIDER_SITE_OTHER): Payer: Medicare Other

## 2012-07-14 DIAGNOSIS — J309 Allergic rhinitis, unspecified: Secondary | ICD-10-CM

## 2012-07-21 ENCOUNTER — Ambulatory Visit (INDEPENDENT_AMBULATORY_CARE_PROVIDER_SITE_OTHER): Payer: Medicare Other

## 2012-07-21 DIAGNOSIS — J309 Allergic rhinitis, unspecified: Secondary | ICD-10-CM | POA: Diagnosis not present

## 2012-07-26 ENCOUNTER — Emergency Department (HOSPITAL_COMMUNITY)
Admission: EM | Admit: 2012-07-26 | Discharge: 2012-07-26 | Disposition: A | Discharge status: Home / Self Care | Payer: Medicare Other | Attending: Emergency Medicine | Admitting: Emergency Medicine

## 2012-07-26 ENCOUNTER — Emergency Department (HOSPITAL_COMMUNITY): Payer: Medicare Other

## 2012-07-26 ENCOUNTER — Encounter (HOSPITAL_COMMUNITY): Payer: Self-pay | Admitting: Emergency Medicine

## 2012-07-26 DIAGNOSIS — Z85840 Personal history of malignant neoplasm of eye: Secondary | ICD-10-CM | POA: Insufficient documentation

## 2012-07-26 DIAGNOSIS — Z87891 Personal history of nicotine dependence: Secondary | ICD-10-CM | POA: Insufficient documentation

## 2012-07-26 DIAGNOSIS — Z860100 Personal history of colon polyps, unspecified: Secondary | ICD-10-CM | POA: Insufficient documentation

## 2012-07-26 DIAGNOSIS — Z9861 Coronary angioplasty status: Secondary | ICD-10-CM | POA: Insufficient documentation

## 2012-07-26 DIAGNOSIS — R1084 Generalized abdominal pain: Secondary | ICD-10-CM | POA: Diagnosis not present

## 2012-07-26 DIAGNOSIS — Z8739 Personal history of other diseases of the musculoskeletal system and connective tissue: Secondary | ICD-10-CM | POA: Insufficient documentation

## 2012-07-26 DIAGNOSIS — R109 Unspecified abdominal pain: Secondary | ICD-10-CM | POA: Diagnosis not present

## 2012-07-26 DIAGNOSIS — R7401 Elevation of levels of liver transaminase levels: Secondary | ICD-10-CM | POA: Insufficient documentation

## 2012-07-26 DIAGNOSIS — Z79899 Other long term (current) drug therapy: Secondary | ICD-10-CM | POA: Insufficient documentation

## 2012-07-26 DIAGNOSIS — I10 Essential (primary) hypertension: Secondary | ICD-10-CM | POA: Insufficient documentation

## 2012-07-26 DIAGNOSIS — E785 Hyperlipidemia, unspecified: Secondary | ICD-10-CM | POA: Diagnosis not present

## 2012-07-26 DIAGNOSIS — Z8669 Personal history of other diseases of the nervous system and sense organs: Secondary | ICD-10-CM | POA: Diagnosis not present

## 2012-07-26 DIAGNOSIS — Z8679 Personal history of other diseases of the circulatory system: Secondary | ICD-10-CM | POA: Diagnosis not present

## 2012-07-26 DIAGNOSIS — Z950 Presence of cardiac pacemaker: Secondary | ICD-10-CM | POA: Diagnosis not present

## 2012-07-26 DIAGNOSIS — R7402 Elevation of levels of lactic acid dehydrogenase (LDH): Secondary | ICD-10-CM | POA: Insufficient documentation

## 2012-07-26 DIAGNOSIS — J449 Chronic obstructive pulmonary disease, unspecified: Secondary | ICD-10-CM | POA: Diagnosis not present

## 2012-07-26 DIAGNOSIS — J4489 Other specified chronic obstructive pulmonary disease: Secondary | ICD-10-CM | POA: Insufficient documentation

## 2012-07-26 DIAGNOSIS — R110 Nausea: Secondary | ICD-10-CM | POA: Diagnosis not present

## 2012-07-26 DIAGNOSIS — Z8719 Personal history of other diseases of the digestive system: Secondary | ICD-10-CM | POA: Insufficient documentation

## 2012-07-26 LAB — CBC WITH DIFFERENTIAL/PLATELET
Basophils Absolute: 0 10*3/uL (ref 0.0–0.1)
Basophils Relative: 0 % (ref 0–1)
Eosinophils Relative: 1 % (ref 0–5)
Lymphocytes Relative: 17 % (ref 12–46)
MCHC: 35 g/dL (ref 30.0–36.0)
MCV: 89.7 fL (ref 78.0–100.0)
Neutro Abs: 6.9 10*3/uL (ref 1.7–7.7)
Platelets: 137 10*3/uL — ABNORMAL LOW (ref 150–400)
RDW: 14.3 % (ref 11.5–15.5)
WBC: 9.3 10*3/uL (ref 4.0–10.5)

## 2012-07-26 LAB — URINALYSIS, ROUTINE W REFLEX MICROSCOPIC
Bilirubin Urine: NEGATIVE
Glucose, UA: NEGATIVE mg/dL
Hgb urine dipstick: NEGATIVE
Specific Gravity, Urine: 1.008 (ref 1.005–1.030)
Urobilinogen, UA: 1 mg/dL (ref 0.0–1.0)
pH: 6 (ref 5.0–8.0)

## 2012-07-26 LAB — COMPREHENSIVE METABOLIC PANEL
ALT: 79 U/L — ABNORMAL HIGH (ref 0–35)
AST: 184 U/L — ABNORMAL HIGH (ref 0–37)
Albumin: 3.3 g/dL — ABNORMAL LOW (ref 3.5–5.2)
CO2: 26 mEq/L (ref 19–32)
Calcium: 9.1 mg/dL (ref 8.4–10.5)
GFR calc non Af Amer: 44 mL/min — ABNORMAL LOW (ref >90)
Sodium: 136 mEq/L (ref 135–145)

## 2012-07-26 MED ORDER — SODIUM CHLORIDE 0.9 % IV SOLN: INTRAVENOUS | Status: DC

## 2012-07-26 MED ORDER — OXYCODONE-ACETAMINOPHEN 5-325 MG PO TABS
1.0000 | ORAL_TABLET | Freq: Once | ORAL | Status: AC
  Administered 2012-07-26: 1 via ORAL
  Filled 2012-07-26: qty 1
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
## 2012-07-26 NOTE — ED Notes (Signed)
Pt continues to report that pain is "manageable" and denies need for further pain management.

## 2012-07-26 NOTE — ED Notes (Signed)
Patient transported to X-ray 

## 2012-07-26 NOTE — ED Notes (Addendum)
Pt presents with SAM - segmental arterial mediolysis disease; seen by Dr. [name] for this. She has occassional flare ups of pain and takes codeine for this pain at home. Pain is uncontrolled at this time so pt called EMS. Pt has 3 aneurisms in liver.

## 2012-07-26 NOTE — ED Notes (Signed)
Pt reports pain has eased off at this time.

## 2012-07-26 NOTE — ED Notes (Signed)
Pt asking for pain medicine to hold her over until she gets home since pain starting to return. MD aware. Meds ordered.

## 2012-07-26 NOTE — ED Notes (Signed)
PT ambulated with baseline gait; VSS; A&Ox3; no signs of distress; respirations even and unlabored; skin warm and dry; no questions upon discharge.  

## 2012-07-27 ENCOUNTER — Telehealth: Payer: Self-pay | Admitting: Gastroenterology

## 2012-07-27 LAB — URINE CULTURE: Colony Count: 25000

## 2012-07-27 NOTE — Telephone Encounter (Signed)
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 States she was supposed to call us and report if she had another episode of pain. Pt was seen in the ER yesterday for abdominal pain. Reports she was told she may need to be checked again for more aneurysms. Pt is not sure what she needs to do in regards to follow-up. Dr. [name] please advise.

## 2012-07-28 ENCOUNTER — Ambulatory Visit (INDEPENDENT_AMBULATORY_CARE_PROVIDER_SITE_OTHER): Payer: Medicare Other

## 2012-07-28 DIAGNOSIS — J309 Allergic rhinitis, unspecified: Secondary | ICD-10-CM

## 2012-07-28 NOTE — Telephone Encounter (Signed)
Spoke with pt and she is aware.

## 2012-07-28 NOTE — Telephone Encounter (Signed)
I will talk with IR and get back to her.

## 2012-07-31 ENCOUNTER — Telehealth: Payer: Self-pay | Admitting: Gastroenterology

## 2012-08-04 ENCOUNTER — Telehealth: Payer: Self-pay | Admitting: Gastroenterology

## 2012-08-04 ENCOUNTER — Ambulatory Visit (INDEPENDENT_AMBULATORY_CARE_PROVIDER_SITE_OTHER): Payer: Medicare Other

## 2012-08-04 DIAGNOSIS — J309 Allergic rhinitis, unspecified: Secondary | ICD-10-CM | POA: Diagnosis not present

## 2012-08-06 NOTE — Telephone Encounter (Signed)
We'll schedule arteriogram of the hepatic artery to rule out recurrent hepatic artery aneurysms.

## 2012-08-06 NOTE — Telephone Encounter (Signed)
Called radiology and spoke with Tammy. She is to speak with Dr. Fredia Sorrow and let me know if the pt needs an OV or just procedure. She is to call back once she speaks with him.

## 2012-08-07 NOTE — Telephone Encounter (Signed)
Received call from Tammy and they have contacted the pt and are waiting for her to call them back. She will let me know when she gets the pt scheduled.

## 2012-08-10 DIAGNOSIS — L821 Other seborrheic keratosis: Secondary | ICD-10-CM | POA: Diagnosis not present

## 2012-08-10 DIAGNOSIS — L570 Actinic keratosis: Secondary | ICD-10-CM | POA: Diagnosis not present

## 2012-08-11 ENCOUNTER — Telehealth: Payer: Self-pay | Admitting: Emergency Medicine

## 2012-08-11 ENCOUNTER — Other Ambulatory Visit: Payer: Self-pay | Admitting: Oncology

## 2012-08-11 ENCOUNTER — Ambulatory Visit (INDEPENDENT_AMBULATORY_CARE_PROVIDER_SITE_OTHER): Payer: Medicare Other

## 2012-08-11 ENCOUNTER — Other Ambulatory Visit (HOSPITAL_COMMUNITY): Payer: Self-pay | Admitting: Interventional Radiology

## 2012-08-11 DIAGNOSIS — J309 Allergic rhinitis, unspecified: Secondary | ICD-10-CM

## 2012-08-11 DIAGNOSIS — R109 Unspecified abdominal pain: Secondary | ICD-10-CM

## 2012-08-11 NOTE — Telephone Encounter (Signed)
LMOVM FOR LINDA, RN TO MAKE HER AWARE THAT PT IS SCHEDULED AT [facility] FOR ARTERIOGRAM W/ EMBO ON 08-26-12.

## 2012-08-11 NOTE — Telephone Encounter (Signed)
Pt scheduled for procedure with Dr. Fredia Sorrow at [facility] 08/26/12, Tammy scheduled and spoke with the pt.

## 2012-08-14 NOTE — Telephone Encounter (Signed)
error 

## 2012-08-17 ENCOUNTER — Encounter (HOSPITAL_COMMUNITY): Payer: Self-pay | Admitting: Pharmacy Technician

## 2012-08-18 ENCOUNTER — Ambulatory Visit (INDEPENDENT_AMBULATORY_CARE_PROVIDER_SITE_OTHER): Payer: Medicare Other

## 2012-08-18 DIAGNOSIS — J309 Allergic rhinitis, unspecified: Secondary | ICD-10-CM | POA: Diagnosis not present

## 2012-08-19 ENCOUNTER — Other Ambulatory Visit: Payer: Self-pay | Admitting: Radiology

## 2012-08-24 DIAGNOSIS — M1710 Unilateral primary osteoarthritis, unspecified knee: Secondary | ICD-10-CM | POA: Diagnosis not present

## 2012-08-25 ENCOUNTER — Other Ambulatory Visit: Payer: Self-pay | Admitting: Radiology

## 2012-08-25 ENCOUNTER — Ambulatory Visit (INDEPENDENT_AMBULATORY_CARE_PROVIDER_SITE_OTHER): Payer: Medicare Other

## 2012-08-25 DIAGNOSIS — J309 Allergic rhinitis, unspecified: Secondary | ICD-10-CM | POA: Diagnosis not present

## 2012-08-26 ENCOUNTER — Other Ambulatory Visit (HOSPITAL_COMMUNITY): Payer: Self-pay | Admitting: Interventional Radiology

## 2012-08-26 ENCOUNTER — Ambulatory Visit (HOSPITAL_COMMUNITY)
Admission: RE | Admit: 2012-08-26 | Discharge: 2012-08-26 | Disposition: A | Discharge status: Home / Self Care | Payer: Medicare Other | Source: Ambulatory Visit | Attending: Interventional Radiology | Admitting: Interventional Radiology

## 2012-08-26 ENCOUNTER — Encounter (HOSPITAL_COMMUNITY): Payer: Self-pay

## 2012-08-26 DIAGNOSIS — Z79899 Other long term (current) drug therapy: Secondary | ICD-10-CM | POA: Diagnosis not present

## 2012-08-26 DIAGNOSIS — E785 Hyperlipidemia, unspecified: Secondary | ICD-10-CM | POA: Diagnosis not present

## 2012-08-26 DIAGNOSIS — J449 Chronic obstructive pulmonary disease, unspecified: Secondary | ICD-10-CM | POA: Diagnosis not present

## 2012-08-26 DIAGNOSIS — I4891 Unspecified atrial fibrillation: Secondary | ICD-10-CM | POA: Insufficient documentation

## 2012-08-26 DIAGNOSIS — R1011 Right upper quadrant pain: Secondary | ICD-10-CM | POA: Diagnosis not present

## 2012-08-26 DIAGNOSIS — R109 Unspecified abdominal pain: Secondary | ICD-10-CM

## 2012-08-26 DIAGNOSIS — K219 Gastro-esophageal reflux disease without esophagitis: Secondary | ICD-10-CM | POA: Diagnosis not present

## 2012-08-26 DIAGNOSIS — I10 Essential (primary) hypertension: Secondary | ICD-10-CM | POA: Diagnosis not present

## 2012-08-26 DIAGNOSIS — J4489 Other specified chronic obstructive pulmonary disease: Secondary | ICD-10-CM | POA: Insufficient documentation

## 2012-08-26 LAB — BASIC METABOLIC PANEL
Chloride: 105 mEq/L (ref 96–112)
Creatinine, Ser: 0.99 mg/dL (ref 0.50–1.10)
GFR calc Af Amer: 59 mL/min — ABNORMAL LOW (ref >90)
Potassium: 4.1 mEq/L (ref 3.5–5.1)
Sodium: 139 mEq/L (ref 135–145)

## 2012-08-26 LAB — APTT: aPTT: 27 seconds (ref 24–37)

## 2012-08-26 LAB — CBC WITH DIFFERENTIAL/PLATELET
Basophils Absolute: 0 10*3/uL (ref 0.0–0.1)
HCT: 43.1 % (ref 36.0–46.0)
Hemoglobin: 14.9 g/dL (ref 12.0–15.0)
Lymphocytes Relative: 13 % (ref 12–46)
Lymphs Abs: 2.2 10*3/uL (ref 0.7–4.0)
Monocytes Absolute: 1.1 10*3/uL — ABNORMAL HIGH (ref 0.1–1.0)
Neutro Abs: 13.5 10*3/uL — ABNORMAL HIGH (ref 1.7–7.7)
RBC: 4.75 MIL/uL (ref 3.87–5.11)
RDW: 14.4 % (ref 11.5–15.5)
WBC: 16.8 10*3/uL — ABNORMAL HIGH (ref 4.0–10.5)

## 2012-08-26 MED ORDER — SODIUM CHLORIDE 0.9 % IV SOLN
Freq: Once | INTRAVENOUS | Status: AC
  Administered 2012-08-26: 08:00:00 via INTRAVENOUS

## 2012-08-26 MED ORDER — IOHEXOL 300 MG/ML  SOLN
150.0000 mL | Freq: Once | INTRAMUSCULAR | Status: AC | PRN
  Administered 2012-08-26: 100 mL via INTRA_ARTERIAL

## 2012-08-26 MED ORDER — FENTANYL CITRATE 0.05 MG/ML IJ SOLN
INTRAMUSCULAR | Status: AC
  Filled 2012-08-26: qty 6

## 2012-08-26 MED ORDER — MIDAZOLAM HCL 2 MG/2ML IJ SOLN
INTRAMUSCULAR | Status: AC | PRN
  Administered 2012-08-26 (×2): 1 mg via INTRAVENOUS

## 2012-08-26 MED ORDER — FENTANYL CITRATE 0.05 MG/ML IJ SOLN
INTRAMUSCULAR | Status: AC | PRN
  Administered 2012-08-26 (×2): 50 ug via INTRAVENOUS

## 2012-08-26 MED ORDER — HYDROCODONE-ACETAMINOPHEN 5-325 MG PO TABS: 1.0000 | ORAL_TABLET | ORAL | Status: DC | PRN

## 2012-08-26 MED ORDER — LIDOCAINE HCL 1 % IJ SOLN
INTRAMUSCULAR | Status: AC
  Filled 2012-08-26: qty 20

## 2012-08-26 MED ORDER — MIDAZOLAM HCL 2 MG/2ML IJ SOLN
INTRAMUSCULAR | Status: AC
  Filled 2012-08-26: qty 6

## 2012-08-26 NOTE — Procedures (Signed)
Procedure:  Visceral/hepatic arteriography Access:  Right CFA, 5Fr sheath Findings:  No recurrent or recanalized hepatic artery aneurysms.  Celiac, common hepatic and SMA injections performed. Plan:  4 hr bedrest recovery
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
## 2012-08-26 NOTE — H&P (Signed)
Agree.  Will proceed with hepatic arteriography to evaluate for recurrent hepatic artery aneurysms.

## 2012-08-28 ENCOUNTER — Encounter: Payer: Self-pay | Admitting: Cardiovascular Disease

## 2012-09-01 ENCOUNTER — Ambulatory Visit (INDEPENDENT_AMBULATORY_CARE_PROVIDER_SITE_OTHER): Payer: Medicare Other

## 2012-09-01 DIAGNOSIS — J309 Allergic rhinitis, unspecified: Secondary | ICD-10-CM

## 2012-09-08 ENCOUNTER — Ambulatory Visit (INDEPENDENT_AMBULATORY_CARE_PROVIDER_SITE_OTHER): Payer: Medicare Other

## 2012-09-08 DIAGNOSIS — J309 Allergic rhinitis, unspecified: Secondary | ICD-10-CM | POA: Diagnosis not present

## 2012-09-15 ENCOUNTER — Ambulatory Visit (INDEPENDENT_AMBULATORY_CARE_PROVIDER_SITE_OTHER): Payer: Medicare Other

## 2012-09-15 DIAGNOSIS — J309 Allergic rhinitis, unspecified: Secondary | ICD-10-CM | POA: Diagnosis not present

## 2012-09-16 ENCOUNTER — Ambulatory Visit (INDEPENDENT_AMBULATORY_CARE_PROVIDER_SITE_OTHER): Payer: Medicare Other

## 2012-09-16 DIAGNOSIS — J309 Allergic rhinitis, unspecified: Secondary | ICD-10-CM | POA: Diagnosis not present

## 2012-09-17 DIAGNOSIS — I4891 Unspecified atrial fibrillation: Secondary | ICD-10-CM | POA: Diagnosis not present

## 2012-09-17 DIAGNOSIS — I4510 Unspecified right bundle-branch block: Secondary | ICD-10-CM | POA: Diagnosis not present

## 2012-09-17 DIAGNOSIS — Z950 Presence of cardiac pacemaker: Secondary | ICD-10-CM | POA: Diagnosis not present

## 2012-09-17 DIAGNOSIS — I10 Essential (primary) hypertension: Secondary | ICD-10-CM | POA: Diagnosis not present

## 2012-09-21 ENCOUNTER — Other Ambulatory Visit: Payer: Self-pay | Admitting: Cardiovascular Disease

## 2012-09-22 ENCOUNTER — Ambulatory Visit (INDEPENDENT_AMBULATORY_CARE_PROVIDER_SITE_OTHER): Payer: Medicare Other

## 2012-09-22 DIAGNOSIS — J309 Allergic rhinitis, unspecified: Secondary | ICD-10-CM | POA: Diagnosis not present

## 2012-09-23 ENCOUNTER — Encounter: Payer: Self-pay | Admitting: Gastroenterology

## 2012-09-23 ENCOUNTER — Ambulatory Visit (INDEPENDENT_AMBULATORY_CARE_PROVIDER_SITE_OTHER): Payer: Medicare Other | Admitting: Gastroenterology

## 2012-09-23 VITALS — BP 138/80 | HR 72 | Ht 61.81 in | Wt 148.5 lb

## 2012-09-23 DIAGNOSIS — R1013 Epigastric pain: Secondary | ICD-10-CM

## 2012-09-23 DIAGNOSIS — I728 Aneurysm of other specified arteries: Secondary | ICD-10-CM

## 2012-09-23 NOTE — Progress Notes (Signed)
History of Present Illness:  The patient has returned for followup of abdominal pain. Since her last visit she had one brief episode of severe right upper quadrant pain that lasted several hours and was relieved with narcotics. Recent arteriogram did not reveal new areas of aneurysmal dilatation of the hepatic artery.  He's had a total of 3 ERCPs, the last in 2007 that were all normal except for diffuse dilatation of the biliary system. She's had a prior sphincterotomy.    Review of Systems: Pertinent positive and negative review of systems were noted in the above HPI section. All other review of systems were otherwise negative.    Current Medications, Allergies, Past Medical History, Past Surgical History, Family History and Social History were reviewed in [employer] electronic medical record  Vital signs were reviewed in today's medical record. Physical Exam: General: Well developed , well nourished, no acute distress Skin: anicteric Head: Normocephalic and atraumatic Eyes:  sclerae anicteric, EOMI Ears: Normal auditory acuity Mouth: No deformity or lesions Lungs: Clear throughout to auscultation Heart: Regular rate and rhythm; no murmurs, rubs or bruits Abdomen: Soft, non tender and non distended. No masses, hepatosplenomegaly or hernias noted. Normal Bowel sounds Rectal:deferred Musculoskeletal: Symmetrical with no gross deformities  Pulses:  Normal pulses noted Extremities: No clubbing, cyanosis, edema or deformities noted Neurological: Alert oriented x 4, grossly nonfocal Psychological:  Alert and cooperative. Normal mood and affect

## 2012-09-23 NOTE — Assessment & Plan Note (Signed)
Patient continues to have episodic severe upper abdominal pain with transient LFT elevations. ERCP x3 since 1999 were negative for stones. Recent arteriogram did not show any new aneurysms. Patient remains on Pradaxa rendering embolic disease less likely. Nonetheless, symptoms are suspicious for transient ischemia. Retained bile duct stones or recurrent stones are unlikely in the face of her negative prior ERCPs.  Recommendations #1 stat evaluation including LFTs for recurrent pain

## 2012-09-23 NOTE — Patient Instructions (Addendum)
Call back when you have anymore severe abdominal pain

## 2012-09-28 ENCOUNTER — Telehealth: Payer: Self-pay | Admitting: Pharmacist

## 2012-09-28 MED ORDER — POTASSIUM CHLORIDE CRYS ER 20 MEQ PO TBCR: 20.0000 meq | EXTENDED_RELEASE_TABLET | Freq: Every day | ORAL | Status: DC

## 2012-09-28 NOTE — Telephone Encounter (Signed)
Rx done for potassium.

## 2012-09-29 ENCOUNTER — Ambulatory Visit (INDEPENDENT_AMBULATORY_CARE_PROVIDER_SITE_OTHER): Payer: Medicare Other

## 2012-09-29 DIAGNOSIS — J309 Allergic rhinitis, unspecified: Secondary | ICD-10-CM

## 2012-09-30 DIAGNOSIS — E782 Mixed hyperlipidemia: Secondary | ICD-10-CM | POA: Diagnosis not present

## 2012-09-30 DIAGNOSIS — M159 Polyosteoarthritis, unspecified: Secondary | ICD-10-CM | POA: Diagnosis not present

## 2012-09-30 DIAGNOSIS — I4891 Unspecified atrial fibrillation: Secondary | ICD-10-CM | POA: Diagnosis not present

## 2012-09-30 DIAGNOSIS — I10 Essential (primary) hypertension: Secondary | ICD-10-CM | POA: Diagnosis not present

## 2012-10-01 DIAGNOSIS — L608 Other nail disorders: Secondary | ICD-10-CM | POA: Diagnosis not present

## 2012-10-01 DIAGNOSIS — M2040 Other hammer toe(s) (acquired), unspecified foot: Secondary | ICD-10-CM | POA: Diagnosis not present

## 2012-10-01 DIAGNOSIS — M2010 Hallux valgus (acquired), unspecified foot: Secondary | ICD-10-CM | POA: Diagnosis not present

## 2012-10-06 ENCOUNTER — Ambulatory Visit (INDEPENDENT_AMBULATORY_CARE_PROVIDER_SITE_OTHER): Payer: Medicare Other

## 2012-10-06 DIAGNOSIS — J309 Allergic rhinitis, unspecified: Secondary | ICD-10-CM

## 2012-10-07 DIAGNOSIS — E782 Mixed hyperlipidemia: Secondary | ICD-10-CM | POA: Diagnosis not present

## 2012-10-07 DIAGNOSIS — I4891 Unspecified atrial fibrillation: Secondary | ICD-10-CM | POA: Diagnosis not present

## 2012-10-07 DIAGNOSIS — M1120 Other chondrocalcinosis, unspecified site: Secondary | ICD-10-CM | POA: Diagnosis not present

## 2012-10-07 DIAGNOSIS — I10 Essential (primary) hypertension: Secondary | ICD-10-CM | POA: Diagnosis not present

## 2012-10-08 ENCOUNTER — Telehealth: Payer: Self-pay | Admitting: Radiology

## 2012-10-08 NOTE — Telephone Encounter (Signed)
Patient had recent IR angiography (08/26/2012.    Checked with Dr Fredia Sorrow re:  Possible f/u imaging & office visit.   Per Dr Jeannie Fend: possible follow up imaging & office visit for May 2015.    Will check with patient early May 2015 for status update.  Above info phoned to patient.    Primus Gritton Carmell Austria, RN 10/08/2012 11:58 AM

## 2012-10-13 ENCOUNTER — Ambulatory Visit (INDEPENDENT_AMBULATORY_CARE_PROVIDER_SITE_OTHER): Payer: Medicare Other

## 2012-10-13 DIAGNOSIS — J309 Allergic rhinitis, unspecified: Secondary | ICD-10-CM | POA: Diagnosis not present

## 2012-10-20 ENCOUNTER — Ambulatory Visit (INDEPENDENT_AMBULATORY_CARE_PROVIDER_SITE_OTHER): Payer: Medicare Other

## 2012-10-20 DIAGNOSIS — J309 Allergic rhinitis, unspecified: Secondary | ICD-10-CM | POA: Diagnosis not present

## 2012-10-27 ENCOUNTER — Ambulatory Visit (INDEPENDENT_AMBULATORY_CARE_PROVIDER_SITE_OTHER): Payer: Medicare Other

## 2012-10-27 DIAGNOSIS — J309 Allergic rhinitis, unspecified: Secondary | ICD-10-CM

## 2012-10-29 DIAGNOSIS — M2040 Other hammer toe(s) (acquired), unspecified foot: Secondary | ICD-10-CM | POA: Diagnosis not present

## 2012-11-03 ENCOUNTER — Ambulatory Visit: Payer: Medicare Other

## 2012-11-10 ENCOUNTER — Other Ambulatory Visit: Payer: Self-pay | Admitting: Cardiovascular Disease

## 2012-11-10 DIAGNOSIS — I4891 Unspecified atrial fibrillation: Secondary | ICD-10-CM | POA: Diagnosis not present

## 2012-11-11 LAB — REMOTE PACEMAKER DEVICE
AL IMPEDENCE PM: 620 Ohm
BRDY-0002RV: 70 {beats}/min
RV LEAD AMPLITUDE: 7.2 mv
VENTRICULAR PACING PM: 2

## 2012-11-11 LAB — PACEMAKER DEVICE OBSERVATION

## 2012-11-24 ENCOUNTER — Ambulatory Visit (INDEPENDENT_AMBULATORY_CARE_PROVIDER_SITE_OTHER): Payer: Medicare Other

## 2012-11-24 DIAGNOSIS — J309 Allergic rhinitis, unspecified: Secondary | ICD-10-CM

## 2012-11-27 ENCOUNTER — Encounter: Payer: Self-pay | Admitting: Cardiovascular Disease

## 2012-12-01 ENCOUNTER — Ambulatory Visit (INDEPENDENT_AMBULATORY_CARE_PROVIDER_SITE_OTHER): Payer: Medicare Other

## 2012-12-01 DIAGNOSIS — J309 Allergic rhinitis, unspecified: Secondary | ICD-10-CM

## 2012-12-05 ENCOUNTER — Encounter: Payer: Self-pay | Admitting: *Deleted

## 2012-12-07 ENCOUNTER — Encounter: Payer: Self-pay | Admitting: Cardiovascular Disease

## 2012-12-07 ENCOUNTER — Ambulatory Visit (INDEPENDENT_AMBULATORY_CARE_PROVIDER_SITE_OTHER): Payer: Medicare Other | Admitting: Cardiovascular Disease

## 2012-12-07 VITALS — BP 136/78 | HR 73 | Resp 20 | Ht 62.0 in | Wt 152.7 lb

## 2012-12-07 DIAGNOSIS — I480 Paroxysmal atrial fibrillation: Secondary | ICD-10-CM

## 2012-12-07 DIAGNOSIS — I2510 Atherosclerotic heart disease of native coronary artery without angina pectoris: Secondary | ICD-10-CM | POA: Diagnosis not present

## 2012-12-07 DIAGNOSIS — Z950 Presence of cardiac pacemaker: Secondary | ICD-10-CM

## 2012-12-07 DIAGNOSIS — I4891 Unspecified atrial fibrillation: Secondary | ICD-10-CM

## 2012-12-07 DIAGNOSIS — E785 Hyperlipidemia, unspecified: Secondary | ICD-10-CM

## 2012-12-07 DIAGNOSIS — L259 Unspecified contact dermatitis, unspecified cause: Secondary | ICD-10-CM | POA: Diagnosis not present

## 2012-12-07 DIAGNOSIS — E7800 Pure hypercholesterolemia, unspecified: Secondary | ICD-10-CM | POA: Insufficient documentation

## 2012-12-07 NOTE — Assessment & Plan Note (Addendum)
Mrs. [name] continues to have episodes of atrial fibrillation associated with shortness of breath, but the episodes are all quite brief, consistently less than an hour in duration. The overall burden of atrial fibrillation is about 70%. This is substantially better with aggressive therapy but is far from ideal. The only other reasonable antiarrhythmic therapy would be amiodarone, which might indeed prove to be more effective, but which will be associated with a higher chance of side effects. Since the transition from Tikosyn to amiodarone would be easy but the transition back from amiodarone to Tikosyn would be quite lengthy and difficult, for the time being we have elected to continue the same therapy.  When asked how she feels she is doing overall, Mrs. [name] says "I think I'm doing great". QT interval today is 464 (QTC 511 ms) this is comparable to previous tracings. Note the picture is duration is prolonged 106 ms

## 2012-12-07 NOTE — Assessment & Plan Note (Signed)
Recent lipid profile - satisfactory results
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
## 2012-12-07 NOTE — Assessment & Plan Note (Signed)
Asymptomatic. 

## 2012-12-07 NOTE — Assessment & Plan Note (Signed)
Dual-chamber [employer] device implanted in December of 2013 for bradycardia secondary to medications necessary for atrial fibrillation. Device function is normal. Interrogation of her device today shows an overall burden of atrial fibrillation of roughly 7% comparable to his start controls. Atrial and ventricular lead parameters are normal (see separate report). Atrial pacing occurs roughly 80% of the time. Note that she also has evidence of intraventricular conduction disease with incomplete right bundle branch block left anterior fascicular block. She has very infrequent ventricular pacing however. No changes are made to device settings

## 2012-12-07 NOTE — Patient Instructions (Addendum)
Your physician recommends that you schedule a follow-up appointment in: 6 months with Dr.Croitoru Your device will continue to send automatic transmissions as long as your monitor is plugged into the wall.

## 2012-12-08 ENCOUNTER — Ambulatory Visit: Payer: Medicare Other

## 2012-12-14 ENCOUNTER — Other Ambulatory Visit: Payer: Self-pay

## 2012-12-14 NOTE — Telephone Encounter (Signed)
Rx request denied, sent to pharmacy electronically.

## 2012-12-16 ENCOUNTER — Other Ambulatory Visit: Payer: Self-pay

## 2012-12-16 MED ORDER — SUCRALFATE 1 G PO TABS: 1.0000 g | ORAL_TABLET | Freq: Two times a day (BID) | ORAL | Status: DC

## 2012-12-16 NOTE — Telephone Encounter (Signed)
Rx was sent to pharmacy electronically. 

## 2012-12-17 ENCOUNTER — Ambulatory Visit: Payer: Medicare Other | Admitting: Cardiovascular Disease

## 2012-12-22 ENCOUNTER — Ambulatory Visit (INDEPENDENT_AMBULATORY_CARE_PROVIDER_SITE_OTHER): Payer: Medicare Other

## 2012-12-22 DIAGNOSIS — C6930 Malignant neoplasm of unspecified choroid: Secondary | ICD-10-CM | POA: Diagnosis not present

## 2012-12-22 DIAGNOSIS — Z961 Presence of intraocular lens: Secondary | ICD-10-CM | POA: Diagnosis not present

## 2012-12-22 DIAGNOSIS — J309 Allergic rhinitis, unspecified: Secondary | ICD-10-CM

## 2012-12-22 DIAGNOSIS — H52209 Unspecified astigmatism, unspecified eye: Secondary | ICD-10-CM | POA: Diagnosis not present

## 2012-12-29 ENCOUNTER — Ambulatory Visit (INDEPENDENT_AMBULATORY_CARE_PROVIDER_SITE_OTHER): Payer: Medicare Other

## 2012-12-29 DIAGNOSIS — I2789 Other specified pulmonary heart diseases: Secondary | ICD-10-CM | POA: Diagnosis not present

## 2013-01-02 ENCOUNTER — Other Ambulatory Visit: Payer: Self-pay | Admitting: Cardiovascular Disease

## 2013-01-04 MED ORDER — MAGNESIUM OXIDE 400 MG PO TABS: 400.0000 mg | ORAL_TABLET | Freq: Every day | ORAL | Status: DC

## 2013-01-04 MED ORDER — DOFETILIDE 250 MCG PO CAPS: 250.0000 ug | ORAL_CAPSULE | Freq: Two times a day (BID) | ORAL | Status: DC

## 2013-01-05 ENCOUNTER — Ambulatory Visit (INDEPENDENT_AMBULATORY_CARE_PROVIDER_SITE_OTHER): Payer: Medicare Other

## 2013-01-05 DIAGNOSIS — J309 Allergic rhinitis, unspecified: Secondary | ICD-10-CM

## 2013-01-12 ENCOUNTER — Ambulatory Visit (INDEPENDENT_AMBULATORY_CARE_PROVIDER_SITE_OTHER): Payer: Medicare Other

## 2013-01-12 DIAGNOSIS — J309 Allergic rhinitis, unspecified: Secondary | ICD-10-CM

## 2013-01-13 ENCOUNTER — Telehealth: Payer: Self-pay | Admitting: Cardiovascular Disease

## 2013-01-13 NOTE — Telephone Encounter (Signed)
BP is running low,feels real sluggish-please call.

## 2013-01-13 NOTE — Telephone Encounter (Signed)
Returned call.  Pt c/o feeling lazy, wore out and washed out.  Stated BP has been running low (intermittently since 8.18.14): 93/60, 98/84, 98/63, 100/64, 96/59, 75/55, 87/59, (Today) 88/55, 84/57.  Stated HR has been in low 70s.  Pt c/o lightheadedness and dizziness when BP is low.  Pt is taking Toprol and Lasix.  Pt informed Dr. Allyson Sabal will be notified for further instructions.  BP at last OV w/ Dr. Salena Saner was 136/78.  Pt verbalized understanding and agreed w/ plan.  Message forwarded to Dr. Allyson Sabal for review and further instructions

## 2013-01-13 NOTE — Telephone Encounter (Signed)
Have patient come in to see a mid-level provider to review blood pressures and medications

## 2013-01-14 ENCOUNTER — Encounter: Payer: Self-pay | Admitting: Physician Assistant

## 2013-01-14 ENCOUNTER — Ambulatory Visit (INDEPENDENT_AMBULATORY_CARE_PROVIDER_SITE_OTHER): Payer: Medicare Other | Admitting: Physician Assistant

## 2013-01-14 VITALS — BP 130/90 | HR 76 | Ht 63.0 in | Wt 153.9 lb

## 2013-01-14 DIAGNOSIS — I10 Essential (primary) hypertension: Secondary | ICD-10-CM

## 2013-01-14 NOTE — Assessment & Plan Note (Signed)
The patient reports feeling better and having more energy when her SBP is at 120 or higher.  According to her records se is having occasional Bps in the 80's, 90's, and 100's.  Some of them coincide with an elevated HR and likely when she is in afib.  I am reluctant to decrease her Toprol and thus increase her Afib burden.  After discussing it with the patient, we will try decreasing lasix to 20 one day and 40mg  the next.  She will monitor her wt and wear compression hose.  Follow up in 6 months or sooner if needed.

## 2013-01-14 NOTE — Patient Instructions (Addendum)
Start alternating 20 and 40 mg of lasix every day.  Follow up with Dr. Royann Shivers in 6 months or sooner if needed.

## 2013-01-14 NOTE — Progress Notes (Signed)
Date:  01/14/2013   ID:  [name], DOB [birth year], MRN [id number]  PCP:  Natasha Fick, MD  Primary Cardiologist:  Croitoru     History of Present Illness: [name] is a 79 y.o. female has a history of frequent recurrent paroxysmal atrial fibrillation despite previous radiofrequency ablation and antiarrhythmic therapy. She has had satisfactory reduction in burden of atrial fibrillation on Tikosyn after undergoing an ablation procedure that was complicated by pericardial effusion and tamponade requiring pericardiocentesis.  Previous PPM interrogation:  Indication of a pacemaker confirms that most of her episodes of atrial fibrillation are measured in seconds with a few episodes last for up to 40 minutes. The overall burden of atrial fibrillation remains relatively high at 7-9%. No bleeding on Pradaxa.  She presents today with concerns that her BP is getting low and she feels tired with no get-up-and-go and some SOB.  She reports feeling better and having more energy when her SBP is at 120 or higher.  According to her records she is having occasional Bps in the 80's, 90's, and 100's. She also reports left LEE which is worse in the morning and better after lasix.  The patient currently denies nausea, vomiting, fever, chest pain,  orthopnea, dizziness, PND, cough, congestion, abdominal pain, hematochezia, melena.  Wt Readings from Last 3 Encounters:  01/14/13 153 lb 14.4 oz (69.809 kg)  12/07/12 152 lb 11.2 oz (69.264 kg)  09/23/12 148 lb 8 oz (67.359 kg)     Past Medical History  Diagnosis Date  . Personal history of colonic polyps   . Diverticulosis of colon (without mention of hemorrhage)   . COPD (chronic obstructive pulmonary disease)   . Dysfunction of eustachian tube   . Persistent atrial fibrillation   . Allergic rhinitis   . Fibromuscular dysplasia   . 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 HTN (hypertension)   .  Hyperlipidemia   . Cancer     melanoma in eye  . Blood transfusion   . GERD (gastroesophageal reflux disease)   . Arthritis   . Hiatal hernia   . Abdominal pain     due to Sequential arterial mediolysis.   Marland Kitchen PAF (paroxysmal atrial fibrillation), after a. fib ablation at [facility], now with RVR 01/05/2012  . Cardiac tamponade, recurrent episode, admitted 9/5, but now felt to be pericarditits 01/17/2012    Current Outpatient Prescriptions  Medication Sig Dispense Refill  . Calcium Carbonate-Vitamin D (CALCIUM + D PO) Take 1 tablet by mouth 2 (two) times daily.      . dabigatran (PRADAXA) 75 MG CAPS Take 75 mg by mouth every 12 (twelve) hours.      . dofetilide (TIKOSYN) 250 MCG capsule Take 1 capsule (250 mcg total) by mouth 2 (two) times daily. Schedule is 8am and 8pm and cannot vary more than 1 hour  60 capsule  6  . furosemide (LASIX) 40 MG tablet Take 40 mg by mouth daily.       Marland Kitchen loratadine (CLARITIN) 10 MG tablet Take 10 mg by mouth daily as needed for allergies.       Marland Kitchen loteprednol (LOTEMAX) 0.5 % ophthalmic suspension Place 1 drop into both eyes 2 (two) times daily.      . magnesium oxide (MAG-OX) 400 MG tablet Take 1 tablet (400 mg total) by mouth daily.  30 tablet  6  . metoprolol succinate (TOPROL-XL) 50 MG 24 hr tablet Take 50  mg by mouth 2 (two) times daily. Take with or immediately following a meal.      . Multiple Vitamins-Minerals (MULTIVITAMINS THER. W/MINERALS) TABS Take 1 tablet by mouth every morning.        . Omega-3 Fatty Acids (FISH OIL) 1000 MG CAPS Take 1 capsule by mouth 2 (two) times daily.       . potassium chloride SA (K-DUR,KLOR-CON) 20 MEQ tablet Take 1 tablet (20 mEq total) by mouth daily.  30 tablet  6  . ranitidine (ZANTAC) 150 MG capsule Take 150 mg by mouth once.      . rosuvastatin (CRESTOR) 5 MG tablet Take 5 mg by mouth every evening.      . sucralfate (CARAFATE) 1 G tablet Take 1 tablet (1 g total) by mouth 2 (two) times daily.  60 tablet  5   No  current facility-administered medications for this visit.    Allergies:    Allergies  Allergen Reactions  . Protonix [Pantoprazole Sodium] Other (See Comments)    Abdominal pain    Social History:  The patient  reports that she has quit smoking. She has never used smokeless tobacco. She reports that she does not drink alcohol or use illicit drugs.   Family history:   Family History  Problem Relation Age of Onset  . Heart disease Mother   . Heart failure Father   . Prostate cancer Father   . Arthritis Sister   . Colon cancer Neg Hx   . Anesthesia problems Neg Hx   . Hypotension Neg Hx   . Malignant hyperthermia Neg Hx   . Pseudochol deficiency Neg Hx   . Atrial fibrillation Sister     ROS:  Please see the history of present illness.  All other systems reviewed and negative.   PHYSICAL EXAM: VS:  BP 130/90  Pulse 76  Ht 5\' 3"  (1.6 m)  Wt 153 lb 14.4 oz (69.809 kg)  BMI 27.27 kg/m2 Well nourished, well developed, in no acute distress HEENT: Pupils are equal round react to light accommodation extraocular movements are intact.  Neck: no JVD Cardiac: Regular rate and rhythm without murmurs rubs or gallops. Lungs:  clear to auscultation bilaterally, no wheezing, rhonchi or rales Ext: 1+left  lower extremity edema.  2+ radial and dorsalis pedis pulses. Skin: warm and dry Neuro:  Grossly normal    ASSESSMENT AND PLAN:  Problem List Items Addressed This Visit   HTN (hypertension) - Primary (Chronic)     The patient reports feeling better and having more energy when her SBP is at 120 or higher.  According to her records se is having occasional Bps in the 80's, 90's, and 100's.  Some of them coincide with an elevated HR and likely when she is in afib.  I am reluctant to decrease her Toprol and thus increase her Afib burden.  After discussing it with the patient, we will try decreasing lasix to 20 one day and 40mg  the next.  She will monitor her wt and wear compression hose.   Follow up in 6 months or sooner if needed.

## 2013-01-14 NOTE — Telephone Encounter (Signed)
Returned call and informed pt per instructions by MD/PA.  Pt verbalized understanding and agreed w/ plan.  Appt scheduled for today at 11am w/ B. Hager, PA-C for BP check and med review.

## 2013-01-19 ENCOUNTER — Ambulatory Visit (INDEPENDENT_AMBULATORY_CARE_PROVIDER_SITE_OTHER): Payer: Medicare Other

## 2013-01-19 DIAGNOSIS — J309 Allergic rhinitis, unspecified: Secondary | ICD-10-CM | POA: Diagnosis not present

## 2013-01-26 ENCOUNTER — Other Ambulatory Visit: Payer: Self-pay | Admitting: Cardiovascular Disease

## 2013-01-26 ENCOUNTER — Ambulatory Visit (INDEPENDENT_AMBULATORY_CARE_PROVIDER_SITE_OTHER): Payer: Medicare Other

## 2013-01-26 DIAGNOSIS — J309 Allergic rhinitis, unspecified: Secondary | ICD-10-CM

## 2013-01-26 NOTE — Telephone Encounter (Signed)
Rx was sent to pharmacy electronically. 

## 2013-02-02 ENCOUNTER — Ambulatory Visit (INDEPENDENT_AMBULATORY_CARE_PROVIDER_SITE_OTHER): Payer: Medicare Other

## 2013-02-02 DIAGNOSIS — J309 Allergic rhinitis, unspecified: Secondary | ICD-10-CM

## 2013-02-09 ENCOUNTER — Ambulatory Visit (INDEPENDENT_AMBULATORY_CARE_PROVIDER_SITE_OTHER): Payer: Medicare Other

## 2013-02-09 DIAGNOSIS — J309 Allergic rhinitis, unspecified: Secondary | ICD-10-CM

## 2013-02-10 ENCOUNTER — Ambulatory Visit (INDEPENDENT_AMBULATORY_CARE_PROVIDER_SITE_OTHER): Payer: Medicare Other

## 2013-02-10 DIAGNOSIS — J309 Allergic rhinitis, unspecified: Secondary | ICD-10-CM | POA: Diagnosis not present

## 2013-02-12 ENCOUNTER — Ambulatory Visit (INDEPENDENT_AMBULATORY_CARE_PROVIDER_SITE_OTHER): Payer: Medicare Other | Admitting: *Deleted

## 2013-02-12 ENCOUNTER — Other Ambulatory Visit: Payer: Self-pay | Admitting: Cardiovascular Disease

## 2013-02-12 DIAGNOSIS — J069 Acute upper respiratory infection, unspecified: Secondary | ICD-10-CM | POA: Diagnosis not present

## 2013-02-12 DIAGNOSIS — I4891 Unspecified atrial fibrillation: Secondary | ICD-10-CM

## 2013-02-12 DIAGNOSIS — I452 Bifascicular block: Secondary | ICD-10-CM

## 2013-02-12 DIAGNOSIS — H109 Unspecified conjunctivitis: Secondary | ICD-10-CM | POA: Diagnosis not present

## 2013-02-12 DIAGNOSIS — R5381 Other malaise: Secondary | ICD-10-CM | POA: Diagnosis not present

## 2013-02-12 DIAGNOSIS — R05 Cough: Secondary | ICD-10-CM | POA: Diagnosis not present

## 2013-02-12 DIAGNOSIS — I480 Paroxysmal atrial fibrillation: Secondary | ICD-10-CM

## 2013-02-16 ENCOUNTER — Ambulatory Visit (INDEPENDENT_AMBULATORY_CARE_PROVIDER_SITE_OTHER): Payer: Medicare Other | Admitting: Internal Medicine

## 2013-02-16 ENCOUNTER — Ambulatory Visit (INDEPENDENT_AMBULATORY_CARE_PROVIDER_SITE_OTHER): Payer: Medicare Other

## 2013-02-16 ENCOUNTER — Encounter: Payer: Self-pay | Admitting: Internal Medicine

## 2013-02-16 VITALS — BP 128/80 | HR 77 | Ht 63.0 in | Wt 157.4 lb

## 2013-02-16 DIAGNOSIS — J301 Allergic rhinitis due to pollen: Secondary | ICD-10-CM | POA: Diagnosis not present

## 2013-02-16 DIAGNOSIS — J309 Allergic rhinitis, unspecified: Secondary | ICD-10-CM

## 2013-02-16 NOTE — Patient Instructions (Signed)
We can continue allergy vaccine for now, but can choose to stop and see how you do at any time.  Please call as needed

## 2013-02-16 NOTE — Progress Notes (Signed)
Patient ID: [name], female    DOB: [birth year], 79 y.o.   MRN: [id number]  HPI 12/28/10- 79 year old female former smoker followed for allergic rhinitis eustachian dysfunction complicated by history of atrial fibrillation/amiodarone. Last here June 29, 2010  Disliked the heat this summer but otherwise denies respiratory problems since last here.  Allergy vaccine- ok, getting shots here. Still uses ipratropium nasal spray for rhinorhea when going out, but doesn't bother with it if she is staying home. CXR 06/29/10- clear, NAD, with emphysema/ COPD change, No indication of amiodarone related changes. Got cortisone shot in knee for gout 4 days ago.  07/02/11-  79 year old female former smoker followed for allergic rhinitis, eustachian dysfunction complicated by history of atrial fibrillation/amiodarone. Stable on allergy vaccine. No change in her sense of shortness of breath with exertion which has been  present for years. Stopped amiodarone in October of 2012. Cardioversion was unsuccessful. Some coughing spells without wheezing. Denies anemia or chest pain. Paces herself. Asks refill for ipratropium nasal spray. Nasal stuffiness contributes to her sense of shortness of breath. PFT 12/15/2007-FEV1 1.91/114% without response to bronchodilator, FEV1/FVC 0.71, TLC 104%, DLCO 60%. CXR-  IMPRESSION: 05/29/11 Mild cardiomegaly without acute disease.  Original Report Authenticated By: Bernadene Bell. D'ALESSIO, M.D.   02/16/13- 79 year old female former smoker followed for allergic rhinitis, eustachian dysfunction complicated by history of atrial fibrillation/amiodarone FOLLOWS FOR: still on Allergy vaccine 1:10 GH and doing well; recently had virus-on abx BID from PCP. Has had flu vaccine. Primary physician treated bronchitis with antibiotic and gave nasal spray and cough syrup. She has not been needing ipratropium nasal spray.   Review of Systems- see HPI Constitutional:   No-   weight loss,  night sweats, fevers, chills, fatigue, lassitude. HEENT:   No-  headaches, difficulty swallowing, tooth/dental problems, sore throat,       No-  sneezing, itching, ear ache, +nasal congestion, post nasal drip,  CV:  No-   chest pain, orthopnea, PND, swelling in lower extremities, anasarca, dizziness, palpitations Resp:+  shortness of breath with exertion or at rest.              No-   productive cough,  + non-productive cough,  No-  coughing up of blood.              No-   change in color of mucus.  No- wheezing.   Skin: No-   rash or lesions. GI:  No-   heartburn, indigestion, abdominal pain, nausea, vomiting,  GU: MS:  + recent  joint pain or swelling. Neuro- grossly normal to observation, Psych:  No- change in mood or affect. No depression or anxiety.  No memory loss.   Objective:   Physical Exam General- Alert, Oriented, Affect-appropriate, Distress- none acute Skin- rash-none, lesions- none, excoriation- none Lymphadenopathy- none Head- atraumatic            Eyes- Gross vision intact, PERRLA, conjunctivae clear secretions            Ears- Hearing, canals normal            Nose- Clear, No-Septal dev, mucus, polyps, erosion, perforation             Throat- Mallampati II , mucosa clear , drainage- none, tonsils- atrophic. + face mask Neck- flexible , trachea midline, no stridor , thyroid nl, carotid no bruit Chest - symmetrical excursion , unlabored           Heart/CV- IRR with occasional extra beat ,  1-2/6 systolic murmur AS , no gallop  , no rub, nl s1 s2                           - JVD- none , edema- none, stasis changes- none, varices- none           Lung- clear to P&A, wheeze- none, light cough , dullness-none, rub- none           Chest wall-  Abd-  Br/ Gen/ Rectal- Not done, not indicated Extrem- cyanosis- none, clubbing, none, atrophy- none, strength- nl Neuro- grossly intact to observation

## 2013-02-22 LAB — PACEMAKER DEVICE OBSERVATION

## 2013-02-23 ENCOUNTER — Ambulatory Visit (INDEPENDENT_AMBULATORY_CARE_PROVIDER_SITE_OTHER): Payer: Medicare Other

## 2013-02-23 DIAGNOSIS — J309 Allergic rhinitis, unspecified: Secondary | ICD-10-CM

## 2013-02-23 LAB — REMOTE PACEMAKER DEVICE
AL AMPLITUDE: 5.8 mv
BAMS-0001: 160 {beats}/min
DEVICE MODEL PM: 144309
RV LEAD AMPLITUDE: 10.7 mv

## 2013-03-01 NOTE — Assessment & Plan Note (Addendum)
Continues allergy vaccine and feels she's doing well. We discussed the option to stop for observation now. She will consider this, especially to get past fall ragweed season.

## 2013-03-02 ENCOUNTER — Ambulatory Visit (INDEPENDENT_AMBULATORY_CARE_PROVIDER_SITE_OTHER): Payer: Medicare Other

## 2013-03-02 DIAGNOSIS — J309 Allergic rhinitis, unspecified: Secondary | ICD-10-CM

## 2013-03-09 ENCOUNTER — Ambulatory Visit (INDEPENDENT_AMBULATORY_CARE_PROVIDER_SITE_OTHER): Payer: Medicare Other

## 2013-03-09 DIAGNOSIS — J309 Allergic rhinitis, unspecified: Secondary | ICD-10-CM | POA: Diagnosis not present

## 2013-03-16 ENCOUNTER — Ambulatory Visit (INDEPENDENT_AMBULATORY_CARE_PROVIDER_SITE_OTHER): Payer: Medicare Other

## 2013-03-16 DIAGNOSIS — J309 Allergic rhinitis, unspecified: Secondary | ICD-10-CM

## 2013-03-22 DIAGNOSIS — M1710 Unilateral primary osteoarthritis, unspecified knee: Secondary | ICD-10-CM | POA: Diagnosis not present

## 2013-03-23 ENCOUNTER — Ambulatory Visit (INDEPENDENT_AMBULATORY_CARE_PROVIDER_SITE_OTHER): Payer: Medicare Other

## 2013-03-23 DIAGNOSIS — J309 Allergic rhinitis, unspecified: Secondary | ICD-10-CM | POA: Diagnosis not present

## 2013-03-24 ENCOUNTER — Other Ambulatory Visit: Payer: Self-pay | Admitting: Orthopaedic Surgery

## 2013-03-24 ENCOUNTER — Encounter: Payer: Self-pay | Admitting: Cardiovascular Disease

## 2013-03-24 ENCOUNTER — Telehealth: Payer: Self-pay | Admitting: Cardiovascular Disease

## 2013-03-24 DIAGNOSIS — M25561 Pain in right knee: Secondary | ICD-10-CM

## 2013-03-24 NOTE — Telephone Encounter (Signed)
Letter in epic

## 2013-03-24 NOTE — Telephone Encounter (Signed)
Message forwarded to Dr. Croitoru/Barbara, CMA 

## 2013-03-24 NOTE — Telephone Encounter (Signed)
Forwarded to Barbara, CMA.  

## 2013-03-24 NOTE — Telephone Encounter (Signed)
Wants to know if she can stop taking her Pradaxa for 48 hrs so she can have her Arthrogram Please. This need to be written and fax to her at [phone].

## 2013-03-25 NOTE — Telephone Encounter (Signed)
Clearance for arthrogram and to hold Pradaxa x 48 hours prior faxed to [phone]

## 2013-03-26 NOTE — Telephone Encounter (Signed)
Still waiting-just received the fax.

## 2013-03-30 ENCOUNTER — Ambulatory Visit
Admission: RE | Admit: 2013-03-30 | Discharge: 2013-03-30 | Disposition: A | Discharge status: Home / Self Care | Payer: Medicare Other | Source: Ambulatory Visit | Attending: Orthopaedic Surgery | Admitting: Orthopaedic Surgery

## 2013-03-30 ENCOUNTER — Ambulatory Visit (INDEPENDENT_AMBULATORY_CARE_PROVIDER_SITE_OTHER): Payer: Medicare Other

## 2013-03-30 DIAGNOSIS — J309 Allergic rhinitis, unspecified: Secondary | ICD-10-CM | POA: Diagnosis not present

## 2013-03-30 DIAGNOSIS — M25561 Pain in right knee: Secondary | ICD-10-CM

## 2013-03-30 DIAGNOSIS — M25569 Pain in unspecified knee: Secondary | ICD-10-CM | POA: Diagnosis not present

## 2013-03-30 MED ORDER — IOHEXOL 180 MG/ML  SOLN: 30.0000 mL | Freq: Once | INTRAMUSCULAR | Status: DC | PRN

## 2013-04-06 ENCOUNTER — Ambulatory Visit (INDEPENDENT_AMBULATORY_CARE_PROVIDER_SITE_OTHER): Payer: Medicare Other

## 2013-04-06 DIAGNOSIS — J309 Allergic rhinitis, unspecified: Secondary | ICD-10-CM | POA: Diagnosis not present

## 2013-04-13 ENCOUNTER — Ambulatory Visit (INDEPENDENT_AMBULATORY_CARE_PROVIDER_SITE_OTHER): Payer: Medicare Other

## 2013-04-13 DIAGNOSIS — J309 Allergic rhinitis, unspecified: Secondary | ICD-10-CM | POA: Diagnosis not present

## 2013-04-14 DIAGNOSIS — Z0000 Encounter for general adult medical examination without abnormal findings: Secondary | ICD-10-CM | POA: Diagnosis not present

## 2013-04-14 DIAGNOSIS — Z1331 Encounter for screening for depression: Secondary | ICD-10-CM | POA: Diagnosis not present

## 2013-04-14 DIAGNOSIS — I4891 Unspecified atrial fibrillation: Secondary | ICD-10-CM | POA: Diagnosis not present

## 2013-04-14 DIAGNOSIS — I10 Essential (primary) hypertension: Secondary | ICD-10-CM | POA: Diagnosis not present

## 2013-04-14 DIAGNOSIS — E782 Mixed hyperlipidemia: Secondary | ICD-10-CM | POA: Diagnosis not present

## 2013-04-14 DIAGNOSIS — Z006 Encounter for examination for normal comparison and control in clinical research program: Secondary | ICD-10-CM | POA: Diagnosis not present

## 2013-04-14 DIAGNOSIS — R748 Abnormal levels of other serum enzymes: Secondary | ICD-10-CM | POA: Diagnosis not present

## 2013-04-14 DIAGNOSIS — M1120 Other chondrocalcinosis, unspecified site: Secondary | ICD-10-CM | POA: Diagnosis not present

## 2013-04-20 ENCOUNTER — Other Ambulatory Visit: Payer: Self-pay | Admitting: Internal Medicine

## 2013-04-20 ENCOUNTER — Ambulatory Visit (INDEPENDENT_AMBULATORY_CARE_PROVIDER_SITE_OTHER): Payer: Medicare Other

## 2013-04-20 DIAGNOSIS — R109 Unspecified abdominal pain: Secondary | ICD-10-CM | POA: Diagnosis not present

## 2013-04-20 DIAGNOSIS — R110 Nausea: Secondary | ICD-10-CM | POA: Diagnosis not present

## 2013-04-20 DIAGNOSIS — J309 Allergic rhinitis, unspecified: Secondary | ICD-10-CM

## 2013-04-20 DIAGNOSIS — K759 Inflammatory liver disease, unspecified: Secondary | ICD-10-CM

## 2013-04-20 DIAGNOSIS — E782 Mixed hyperlipidemia: Secondary | ICD-10-CM | POA: Diagnosis not present

## 2013-04-21 DIAGNOSIS — H43819 Vitreous degeneration, unspecified eye: Secondary | ICD-10-CM | POA: Diagnosis not present

## 2013-04-21 DIAGNOSIS — Z961 Presence of intraocular lens: Secondary | ICD-10-CM | POA: Diagnosis not present

## 2013-04-21 DIAGNOSIS — C6930 Malignant neoplasm of unspecified choroid: Secondary | ICD-10-CM | POA: Diagnosis not present

## 2013-04-22 ENCOUNTER — Other Ambulatory Visit: Payer: Self-pay | Admitting: *Deleted

## 2013-04-22 MED ORDER — POTASSIUM CHLORIDE CRYS ER 20 MEQ PO TBCR: 20.0000 meq | EXTENDED_RELEASE_TABLET | Freq: Every day | ORAL | Status: DC

## 2013-04-22 NOTE — Telephone Encounter (Signed)
Rx was sent to pharmacy electronically. 

## 2013-04-23 IMAGING — CT CT ABDOMEN WO/W CM
3 of 9 series · 12 of 46 positions shown, 18 images · IV contrast ([ID] [id number])
Comparison: 10/25/2010 and 10/18/2009.

CLINICAL DATA: 3-year follow-up for radio frequency ablation of a
left renal neoplasm.

CT ABDOMEN WITHOUT AND WITH CONTRAST
TECHNIQUE: Multidetector CT imaging of the abdomen was performed
following the standard protocol before and during bolus
administration of intravenous contrast.
Contrast: 80mL OMNIPAQUE IOHEXOL 300 MG/ML  SOLN

[Series 4: renal arterial · axial · arterial · 0.66mm/px · z∈[-202,-142]mm · 3 of 70 slices shown]
[im 10/70  soft-tissue]
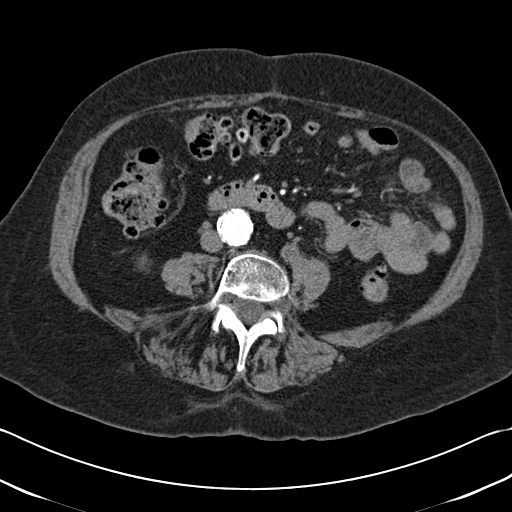
[im 20/70  soft-tissue]
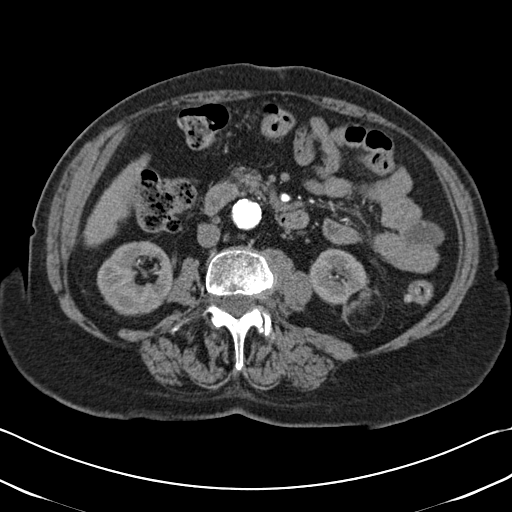
[im 30/70  soft-tissue]
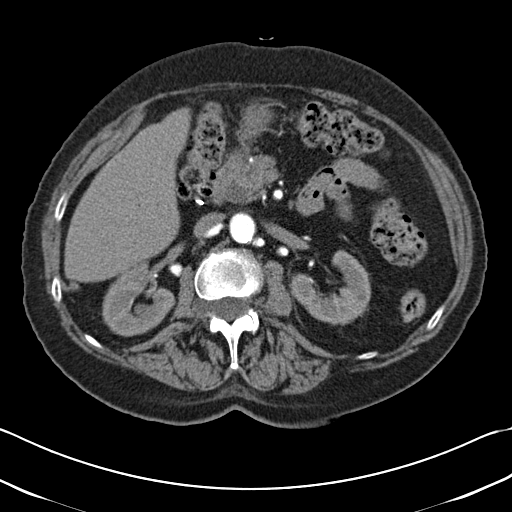

[Series 6: venous · axial · portal-venous · 0.66mm/px · z∈[-202,-52]mm · 6 of 70 slices shown, 11 images]
[im 10/70  soft-tissue]
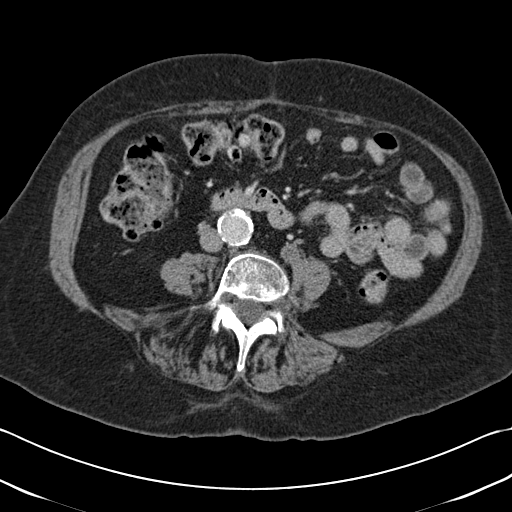
[im 10/70  bone]
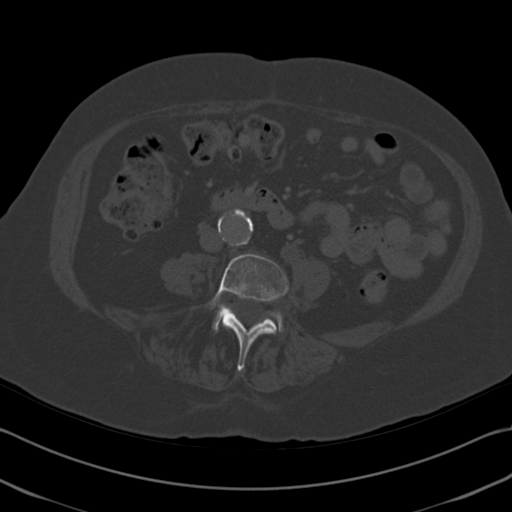
[im 20/70  soft-tissue]
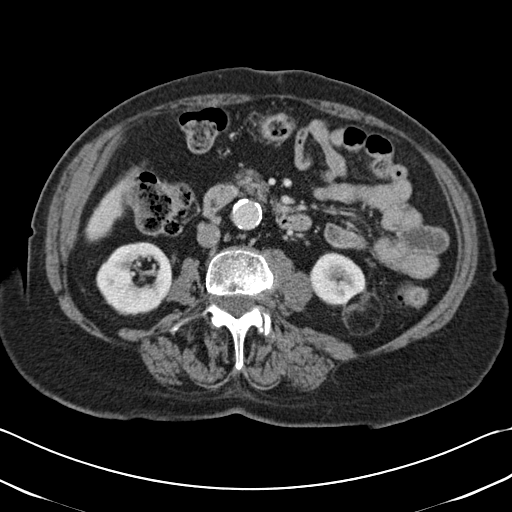
[im 30/70  soft-tissue]
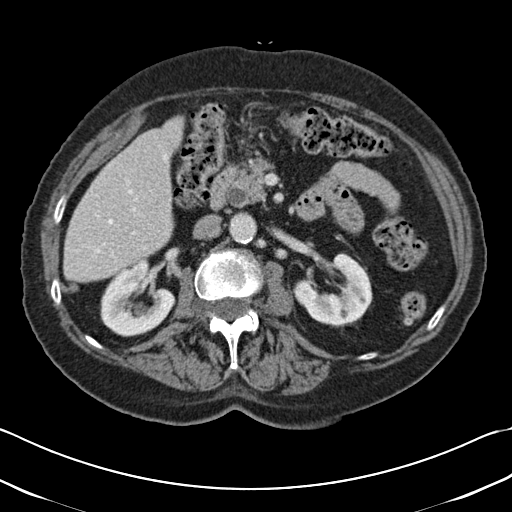
[im 30/70  lung]
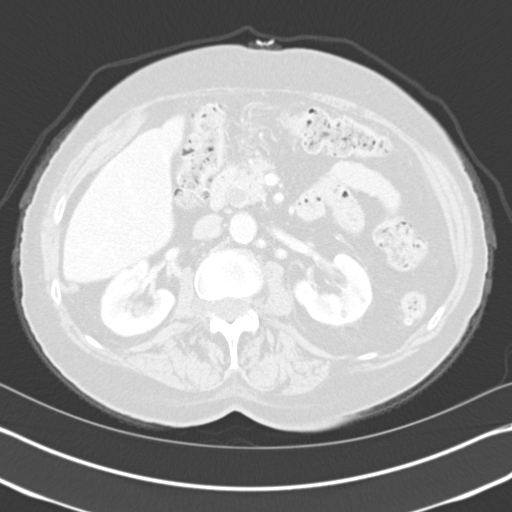
[im 40/70  soft-tissue]
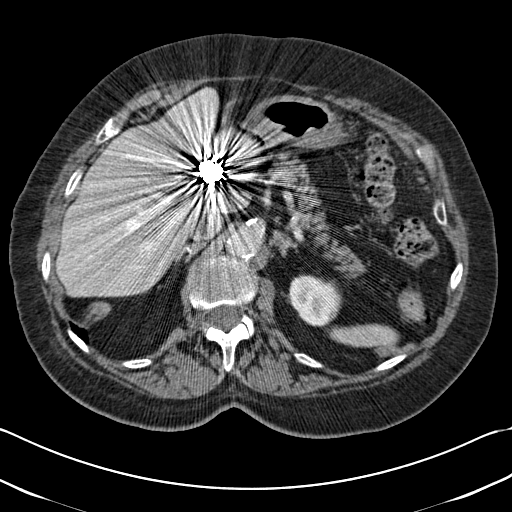
[im 40/70  lung]
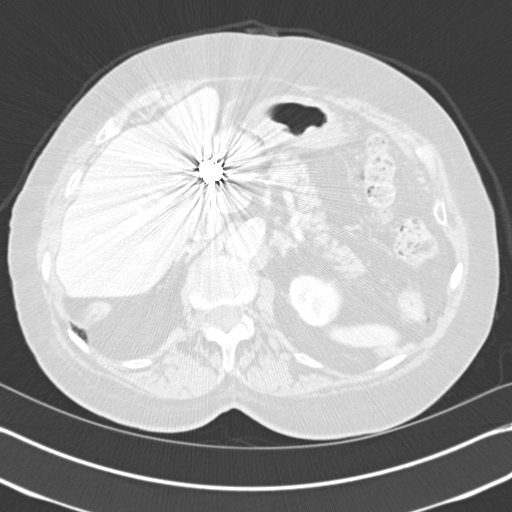
[im 50/70  soft-tissue]
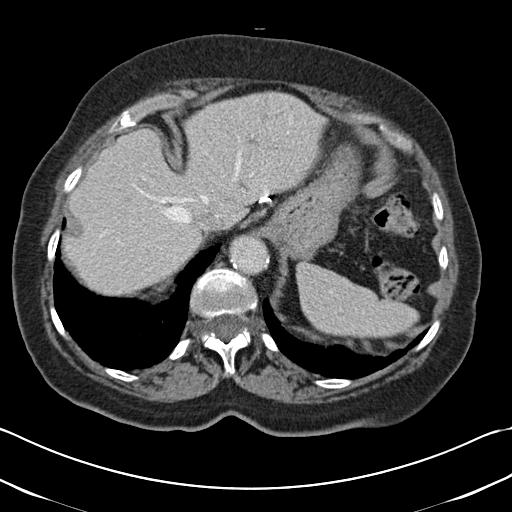
[im 50/70  lung]
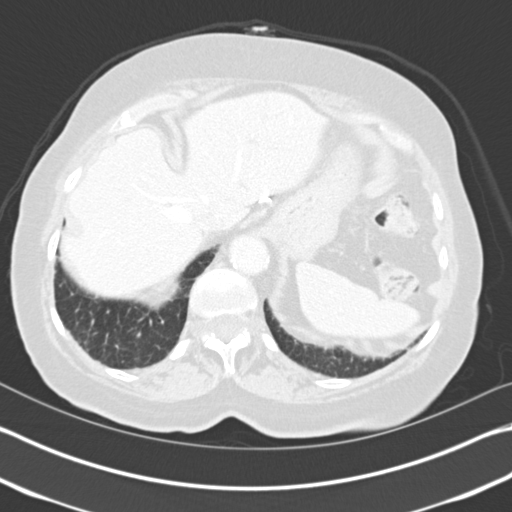
[im 60/70  soft-tissue]
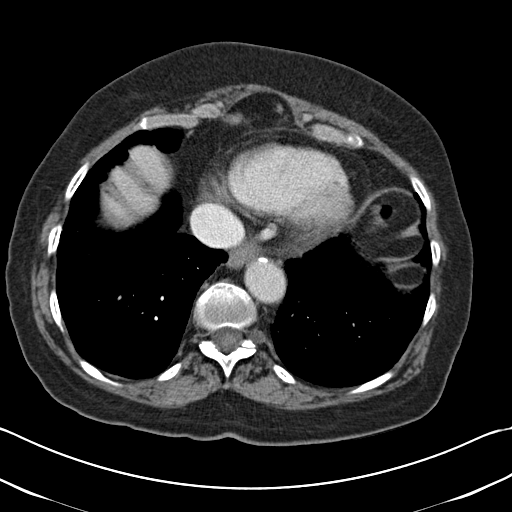
[im 60/70  lung]
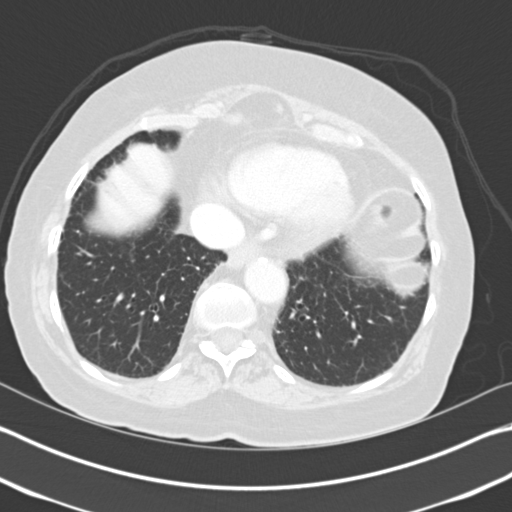

[Series 602: <mpr thick range> · coronal · 0.66mm/px · 3 of 83 slices shown, 4 images]
[im 21/83  soft-tissue]
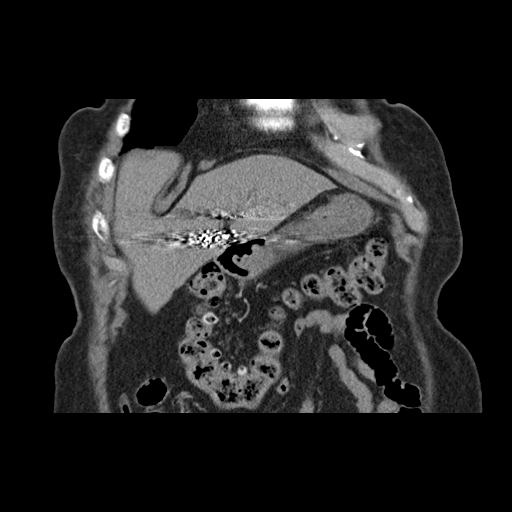
[im 42/83  soft-tissue]
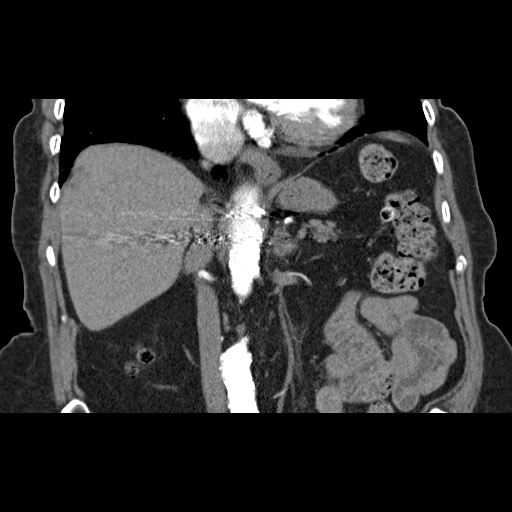
[im 42/83  bone]
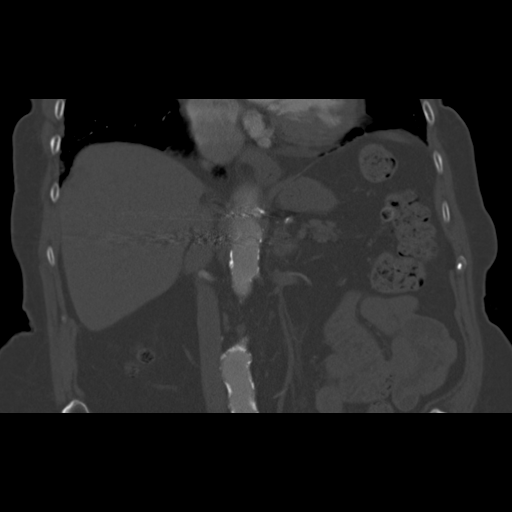
[im 62/83  soft-tissue]
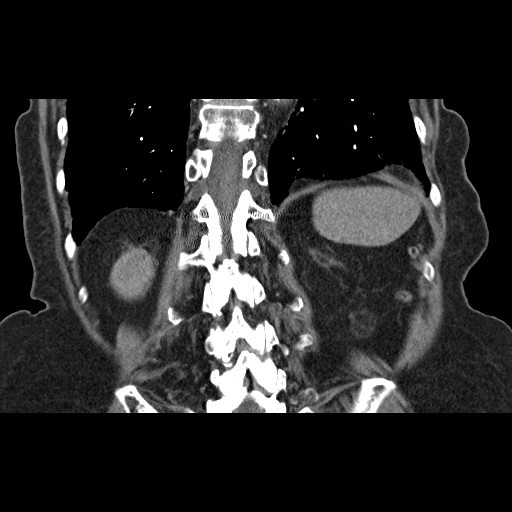

[12 of 46 positions shown; findings below may reference images not displayed]

FINDINGS: Examination of the lung bases demonstrates no acute
pulmonary findings.  No pulmonary nodules or pleural effusion.
Stable left paraspinal mass in the lower thoracic spine which is
water attenuation and is most likely a perineural cyst or
neurogenic cyst.

The liver is stable.  No focal lesions.  Significant artifact
associated with prior embolization coils.  No findings for
metastatic disease.  Stable common bile duct dilatation likely due
to prior cholecystectomy.  The pancreas is unremarkable and stable.
The spleen is normal in size.  Stable small left adrenal gland
adenoma and small calcification associated with the lateral limb of
the right adrenal gland.

Stable CT appearance of the radio frequency ablation defect
involving the lower pole region of the left kidney.  No CT findings
for residual or recurrent tumor.  Both kidneys are otherwise
normal.  No renal calculi or hydronephrosis.  Normal symmetric
renal enhancement postcontrast.

The stomach demonstrates a small hiatal hernia.  No mass or
inflammation.  The duodenum, small bowel and colon are
unremarkable.  Stable scattered colonic diverticulosis.

Stable advanced atherosclerotic calcifications involving the aorta
and branch vessels.  Stable infrarenal abdominal aortic aneurysm
measuring a maximum of 2.8 x 2.7 cm.  No dissection.  No mesenteric
or retroperitoneal masses or adenopathy.

The bony structures are intact.
IMPRESSION: 1.  Stable RFA changes involving the left kidney.  No findings for
recurrent tumor or metastatic disease.
2.  Stable left adrenal gland adenoma.
3.  Stable advanced atherosclerotic changes involving the abdominal
aorta and branch vessels.

## 2013-04-27 ENCOUNTER — Ambulatory Visit
Admission: RE | Admit: 2013-04-27 | Discharge: 2013-04-27 | Disposition: A | Discharge status: Home / Self Care | Payer: Medicare Other | Source: Ambulatory Visit | Attending: Internal Medicine | Admitting: Internal Medicine

## 2013-04-27 DIAGNOSIS — K7689 Other specified diseases of liver: Secondary | ICD-10-CM | POA: Diagnosis not present

## 2013-04-27 DIAGNOSIS — K759 Inflammatory liver disease, unspecified: Secondary | ICD-10-CM

## 2013-04-30 DIAGNOSIS — M1710 Unilateral primary osteoarthritis, unspecified knee: Secondary | ICD-10-CM | POA: Diagnosis not present

## 2013-04-30 DIAGNOSIS — R7989 Other specified abnormal findings of blood chemistry: Secondary | ICD-10-CM | POA: Diagnosis not present

## 2013-04-30 DIAGNOSIS — M1120 Other chondrocalcinosis, unspecified site: Secondary | ICD-10-CM | POA: Diagnosis not present

## 2013-04-30 DIAGNOSIS — I10 Essential (primary) hypertension: Secondary | ICD-10-CM | POA: Diagnosis not present

## 2013-04-30 DIAGNOSIS — I4891 Unspecified atrial fibrillation: Secondary | ICD-10-CM | POA: Diagnosis not present

## 2013-04-30 DIAGNOSIS — E782 Mixed hyperlipidemia: Secondary | ICD-10-CM | POA: Diagnosis not present

## 2013-05-11 ENCOUNTER — Other Ambulatory Visit: Payer: Self-pay | Admitting: Cardiovascular Disease

## 2013-05-11 ENCOUNTER — Ambulatory Visit (INDEPENDENT_AMBULATORY_CARE_PROVIDER_SITE_OTHER): Payer: Medicare Other

## 2013-05-11 DIAGNOSIS — J309 Allergic rhinitis, unspecified: Secondary | ICD-10-CM | POA: Diagnosis not present

## 2013-05-12 DIAGNOSIS — M1710 Unilateral primary osteoarthritis, unspecified knee: Secondary | ICD-10-CM | POA: Diagnosis not present

## 2013-05-12 NOTE — Telephone Encounter (Signed)
Rx was sent to pharmacy electronically. 

## 2013-05-18 ENCOUNTER — Ambulatory Visit (INDEPENDENT_AMBULATORY_CARE_PROVIDER_SITE_OTHER): Payer: Medicare Other

## 2013-05-18 DIAGNOSIS — J309 Allergic rhinitis, unspecified: Secondary | ICD-10-CM

## 2013-05-19 DIAGNOSIS — M1710 Unilateral primary osteoarthritis, unspecified knee: Secondary | ICD-10-CM | POA: Diagnosis not present

## 2013-05-20 ENCOUNTER — Encounter: Payer: Self-pay | Admitting: Internal Medicine

## 2013-05-21 DIAGNOSIS — Z961 Presence of intraocular lens: Secondary | ICD-10-CM | POA: Diagnosis not present

## 2013-05-21 DIAGNOSIS — H40009 Preglaucoma, unspecified, unspecified eye: Secondary | ICD-10-CM | POA: Diagnosis not present

## 2013-05-25 ENCOUNTER — Ambulatory Visit (INDEPENDENT_AMBULATORY_CARE_PROVIDER_SITE_OTHER): Payer: Medicare Other

## 2013-05-25 DIAGNOSIS — J309 Allergic rhinitis, unspecified: Secondary | ICD-10-CM

## 2013-05-26 DIAGNOSIS — M1710 Unilateral primary osteoarthritis, unspecified knee: Secondary | ICD-10-CM | POA: Diagnosis not present

## 2013-05-29 ENCOUNTER — Emergency Department (HOSPITAL_COMMUNITY)
Admission: EM | Admit: 2013-05-29 | Discharge: 2013-05-29 | Disposition: A | Discharge status: Home / Self Care | Payer: Medicare Other | Attending: Emergency Medicine | Admitting: Emergency Medicine

## 2013-05-29 ENCOUNTER — Encounter (HOSPITAL_COMMUNITY): Payer: Self-pay | Admitting: Emergency Medicine

## 2013-05-29 DIAGNOSIS — I4891 Unspecified atrial fibrillation: Secondary | ICD-10-CM | POA: Diagnosis not present

## 2013-05-29 DIAGNOSIS — Z860100 Personal history of colon polyps, unspecified: Secondary | ICD-10-CM | POA: Insufficient documentation

## 2013-05-29 DIAGNOSIS — Z87891 Personal history of nicotine dependence: Secondary | ICD-10-CM | POA: Diagnosis not present

## 2013-05-29 DIAGNOSIS — Z79899 Other long term (current) drug therapy: Secondary | ICD-10-CM | POA: Insufficient documentation

## 2013-05-29 DIAGNOSIS — I314 Cardiac tamponade: Secondary | ICD-10-CM | POA: Diagnosis not present

## 2013-05-29 DIAGNOSIS — K5730 Diverticulosis of large intestine without perforation or abscess without bleeding: Secondary | ICD-10-CM | POA: Diagnosis not present

## 2013-05-29 DIAGNOSIS — H6990 Unspecified Eustachian tube disorder, unspecified ear: Secondary | ICD-10-CM | POA: Insufficient documentation

## 2013-05-29 DIAGNOSIS — I10 Essential (primary) hypertension: Secondary | ICD-10-CM | POA: Diagnosis not present

## 2013-05-29 DIAGNOSIS — J301 Allergic rhinitis due to pollen: Secondary | ICD-10-CM | POA: Diagnosis not present

## 2013-05-29 DIAGNOSIS — I7789 Other specified disorders of arteries and arterioles: Secondary | ICD-10-CM | POA: Diagnosis not present

## 2013-05-29 DIAGNOSIS — R110 Nausea: Secondary | ICD-10-CM | POA: Insufficient documentation

## 2013-05-29 DIAGNOSIS — E785 Hyperlipidemia, unspecified: Secondary | ICD-10-CM | POA: Diagnosis not present

## 2013-05-29 DIAGNOSIS — R109 Unspecified abdominal pain: Secondary | ICD-10-CM | POA: Diagnosis not present

## 2013-05-29 DIAGNOSIS — R748 Abnormal levels of other serum enzymes: Secondary | ICD-10-CM

## 2013-05-29 DIAGNOSIS — I729 Aneurysm of unspecified site: Secondary | ICD-10-CM | POA: Insufficient documentation

## 2013-05-29 DIAGNOSIS — R7989 Other specified abnormal findings of blood chemistry: Secondary | ICD-10-CM | POA: Diagnosis not present

## 2013-05-29 DIAGNOSIS — J449 Chronic obstructive pulmonary disease, unspecified: Secondary | ICD-10-CM | POA: Insufficient documentation

## 2013-05-29 DIAGNOSIS — H6980 Other specified disorders of Eustachian tube, unspecified ear: Secondary | ICD-10-CM | POA: Diagnosis not present

## 2013-05-29 DIAGNOSIS — J4489 Other specified chronic obstructive pulmonary disease: Secondary | ICD-10-CM | POA: Insufficient documentation

## 2013-05-29 LAB — CBC WITH DIFFERENTIAL/PLATELET
BASOS ABS: 0 10*3/uL (ref 0.0–0.1)
BASOS PCT: 0 % (ref 0–1)
EOS ABS: 0.1 10*3/uL (ref 0.0–0.7)
EOS PCT: 1 % (ref 0–5)
HCT: 43.6 % (ref 36.0–46.0)
Hemoglobin: 15 g/dL (ref 12.0–15.0)
LYMPHS PCT: 16 % (ref 12–46)
Lymphs Abs: 1.9 10*3/uL (ref 0.7–4.0)
MCH: 33.5 pg (ref 26.0–34.0)
MCHC: 34.4 g/dL (ref 30.0–36.0)
MCV: 97.3 fL (ref 78.0–100.0)
Monocytes Absolute: 1.3 10*3/uL — ABNORMAL HIGH (ref 0.1–1.0)
Monocytes Relative: 11 % (ref 3–12)
Neutro Abs: 8.6 10*3/uL — ABNORMAL HIGH (ref 1.7–7.7)
Neutrophils Relative %: 72 % (ref 43–77)
Platelets: 165 10*3/uL (ref 150–400)
RBC: 4.48 MIL/uL (ref 3.87–5.11)
RDW: 13.2 % (ref 11.5–15.5)
WBC: 11.9 10*3/uL — ABNORMAL HIGH (ref 4.0–10.5)

## 2013-05-29 LAB — COMPREHENSIVE METABOLIC PANEL
ALT: 191 U/L — ABNORMAL HIGH (ref 0–35)
AST: 212 U/L — AB (ref 0–37)
Albumin: 3.4 g/dL — ABNORMAL LOW (ref 3.5–5.2)
Alkaline Phosphatase: 386 U/L — ABNORMAL HIGH (ref 39–117)
BUN: 17 mg/dL (ref 6–23)
CALCIUM: 9.2 mg/dL (ref 8.4–10.5)
CO2: 25 meq/L (ref 19–32)
CREATININE: 1.07 mg/dL (ref 0.50–1.10)
Chloride: 102 mEq/L (ref 96–112)
GFR calc Af Amer: 53 mL/min — ABNORMAL LOW (ref >90)
GFR, EST NON AFRICAN AMERICAN: 46 mL/min — AB (ref >90)
Glucose, Bld: 95 mg/dL (ref 70–99)
Potassium: 5.4 mEq/L — ABNORMAL HIGH (ref 3.7–5.3)
Sodium: 143 mEq/L (ref 137–147)
TOTAL PROTEIN: 6.3 g/dL (ref 6.0–8.3)
Total Bilirubin: 7 mg/dL — ABNORMAL HIGH (ref 0.3–1.2)

## 2013-05-29 LAB — URINALYSIS, ROUTINE W REFLEX MICROSCOPIC
Glucose, UA: NEGATIVE mg/dL
Hgb urine dipstick: NEGATIVE
Ketones, ur: 15 mg/dL — AB
NITRITE: NEGATIVE
Protein, ur: NEGATIVE mg/dL
SPECIFIC GRAVITY, URINE: 1.019 (ref 1.005–1.030)
UROBILINOGEN UA: 1 mg/dL (ref 0.0–1.0)
pH: 5 (ref 5.0–8.0)

## 2013-05-29 LAB — URINE MICROSCOPIC-ADD ON

## 2013-05-29 LAB — LIPASE, BLOOD: Lipase: 31 U/L (ref 11–59)

## 2013-05-29 MED ORDER — CEPHALEXIN 500 MG PO CAPS: 500.0000 mg | ORAL_CAPSULE | Freq: Two times a day (BID) | ORAL | Status: DC

## 2013-05-29 MED ORDER — OXYCODONE-ACETAMINOPHEN 5-325 MG PO TABS: 1.0000 | ORAL_TABLET | ORAL | Status: DC | PRN

## 2013-05-29 NOTE — ED Notes (Signed)
Pt c/o mid upper abdominal pain with nausea. Family reports pt been dealing with this since 1998. Pt reports that she had a spell yesterday. Pt denies nausea at present. Pt has been seen by PMD for same. Pt last BM was this morning, looked different described as pasty.
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
## 2013-05-29 NOTE — ED Notes (Signed)
Pt request blood to be drawn when she gets and IV because she is a difficult stick.

## 2013-05-31 ENCOUNTER — Telehealth: Payer: Self-pay | Admitting: Gastroenterology

## 2013-05-31 DIAGNOSIS — R945 Abnormal results of liver function studies: Secondary | ICD-10-CM

## 2013-05-31 DIAGNOSIS — R7989 Other specified abnormal findings of blood chemistry: Secondary | ICD-10-CM

## 2013-05-31 NOTE — Telephone Encounter (Signed)
Pt with hx of epigastric pain and aneurysm of other visceral artery. She has pain on and off and her liver enzymes will increase. On Friday pt reports pain and she took a pain pill which eased it some. The next day it came back and she thought she would go to the ER. Her LFTs were elevated more than usual, but the pain went away and she was to call here today. Today pt denies any pain. Amy Esterwood, PA nor you have any appts until Thursday; do you want to just repeat labs or bring pt in? Thanks.

## 2013-06-01 ENCOUNTER — Ambulatory Visit: Payer: Medicare Other

## 2013-06-01 DIAGNOSIS — M1710 Unilateral primary osteoarthritis, unspecified knee: Secondary | ICD-10-CM | POA: Diagnosis not present

## 2013-06-01 NOTE — Telephone Encounter (Signed)
Since she is better she can be seen later in the week.  Repeat LFTs prior to office visit.

## 2013-06-01 NOTE — Telephone Encounter (Signed)
Pt will be seen on 06/03/13 and have her labs done prior to the appt.

## 2013-06-03 ENCOUNTER — Encounter: Payer: Self-pay | Admitting: Gastroenterology

## 2013-06-03 ENCOUNTER — Ambulatory Visit (INDEPENDENT_AMBULATORY_CARE_PROVIDER_SITE_OTHER): Payer: Medicare Other | Admitting: Gastroenterology

## 2013-06-03 ENCOUNTER — Other Ambulatory Visit (INDEPENDENT_AMBULATORY_CARE_PROVIDER_SITE_OTHER): Payer: Medicare Other

## 2013-06-03 VITALS — BP 124/80 | HR 88 | Ht 61.5 in | Wt 154.2 lb

## 2013-06-03 DIAGNOSIS — R7989 Other specified abnormal findings of blood chemistry: Secondary | ICD-10-CM

## 2013-06-03 DIAGNOSIS — R1013 Epigastric pain: Secondary | ICD-10-CM | POA: Diagnosis not present

## 2013-06-03 DIAGNOSIS — R945 Abnormal results of liver function studies: Secondary | ICD-10-CM

## 2013-06-03 LAB — HEPATIC FUNCTION PANEL
ALT: 64 U/L — ABNORMAL HIGH (ref 0–35)
AST: 47 U/L — ABNORMAL HIGH (ref 0–37)
Albumin: 3.5 g/dL (ref 3.5–5.2)
Alkaline Phosphatase: 278 U/L — ABNORMAL HIGH (ref 39–117)
Bilirubin, Direct: 0.7 mg/dL — ABNORMAL HIGH (ref 0.0–0.3)
Total Bilirubin: 1.7 mg/dL — ABNORMAL HIGH (ref 0.3–1.2)
Total Protein: 6.8 g/dL (ref 6.0–8.3)

## 2013-06-03 NOTE — Assessment & Plan Note (Signed)
Patient continues to have episodic severe upper abdominal pain with transient LFT elevations. ERCP x3 since 1999 were negative for stones.  Her last exam was 2007.  Arteriogram in May, 2014 did not show any new aneurysms. Patient remains on Pradaxa rendering embolic disease less likely. Nonetheless, symptoms are suspicious for transient ischemia. Retained bile duct stones or recurrent stones are less likely in the face of her negative prior ERCPs although clinical indication is very suggestive of choledocholithiasis.  Unable to do MRCP because patient has a pacemaker.  Recommendations #1 I will discuss this case with my colleagues prior to making further recommendations.

## 2013-06-03 NOTE — Patient Instructions (Signed)
Call back as needed 

## 2013-06-03 NOTE — Progress Notes (Signed)
          History of Present Illness:  The patient has returned for followup of abdominal pain.  Last week she again developed severe right upper quadrant pain reminiscent of her prior episodes of pain.  At evaluation in the emergency room  liver tests were abnormal.  Alkaline phosphatase was 386, AST 212 and ALT 191.  5 days later alk phosphatase decreased to 78, AST 47 and ALT 64.  She is now pain-free.  An ultrasound in December, 2014 demonstrated a bile duct measuring 10.6-12.8 mm.  In may, 2014 she underwent repeat arteriography.  This did not show any new areas of aneurysmal dilatation of the hepatic artery.  She is now pain-free.    Review of Systems: She's had some shortness of breath which she associates with a rapid heart rate do to a true fibrillation   Pertinent positive and negative review of systems were noted in the above HPI section. All other review of systems were otherwise negative.    Current Medications, Allergies, Past Medical History, Past Surgical History, Family History and Social History were reviewed in [facility] record  Vital signs were reviewed in today's medical record. Physical Exam: General: Well developed , well nourished, no acute distress Skin: anicteric Head: Normocephalic and atraumatic Eyes:  sclerae anicteric, EOMI Ears: Normal auditory acuity Mouth: No deformity or lesions Lungs: Clear throughout to auscultation Heart: Regular rate and rhythm; no murmurs, rubs or bruits Abdomen: Soft, non tender and non distended. No masses, hepatosplenomegaly or hernias noted. Normal Bowel sounds Rectal:deferred Musculoskeletal: Symmetrical with no gross deformities  Pulses:  Normal pulses noted Extremities: No clubbing, cyanosis, edema or deformities noted Neurological: Alert oriented x 4, grossly nonfocal Psychological:  Alert and cooperative. Normal mood and affect  See Assessment and Plan under Problem List

## 2013-06-07 ENCOUNTER — Telehealth: Payer: Self-pay

## 2013-06-07 NOTE — Telephone Encounter (Signed)
Pt was scheduled to see Dr Ardis Hughs on 06/08/13 she is aware

## 2013-06-07 NOTE — Telephone Encounter (Signed)
Message copied by Barron Alvine on Mon Jun 07, 2013  2:32 PM ------      Message from: Owens Loffler P      Created: Mon Jun 07, 2013  2:21 PM       Yes, she needs NGI appt with me. Next avail.  Thanks                  ----- Message -----         From: Barron Alvine, CMA         Sent: 06/07/2013   2:05 PM           To: Milus Banister, MD                        ----- Message -----         From: Oda Kilts, CMA         Sent: 06/07/2013   1:51 PM           To: Barron Alvine, CMA                        ----- Message -----         From: Inda Castle, MD         Sent: 06/04/2013   9:11 AM           To: Milus Banister, MD, Oda Kilts, CMA            Shirlean Mylar,      Please set up Mrs. Wetherby to see Dr. Ardis Hughs in the office.  He will be scheduling her for an EUS but I would like him to see her first.       ------

## 2013-06-08 ENCOUNTER — Ambulatory Visit (INDEPENDENT_AMBULATORY_CARE_PROVIDER_SITE_OTHER): Payer: Medicare Other

## 2013-06-08 ENCOUNTER — Other Ambulatory Visit (INDEPENDENT_AMBULATORY_CARE_PROVIDER_SITE_OTHER): Payer: Medicare Other

## 2013-06-08 ENCOUNTER — Encounter: Payer: Self-pay | Admitting: Gastroenterology

## 2013-06-08 ENCOUNTER — Ambulatory Visit (INDEPENDENT_AMBULATORY_CARE_PROVIDER_SITE_OTHER): Payer: Medicare Other | Admitting: Gastroenterology

## 2013-06-08 VITALS — BP 132/68 | HR 60 | Ht 61.5 in | Wt 155.0 lb

## 2013-06-08 DIAGNOSIS — R109 Unspecified abdominal pain: Secondary | ICD-10-CM

## 2013-06-08 DIAGNOSIS — J309 Allergic rhinitis, unspecified: Secondary | ICD-10-CM

## 2013-06-08 LAB — HEPATIC FUNCTION PANEL
ALT: 44 U/L — ABNORMAL HIGH (ref 0–35)
AST: 54 U/L — ABNORMAL HIGH (ref 0–37)
Albumin: 3.7 g/dL (ref 3.5–5.2)
Alkaline Phosphatase: 206 U/L — ABNORMAL HIGH (ref 39–117)
BILIRUBIN TOTAL: 1.5 mg/dL — AB (ref 0.3–1.2)
Bilirubin, Direct: 0.4 mg/dL — ABNORMAL HIGH (ref 0.0–0.3)
Total Protein: 6.9 g/dL (ref 6.0–8.3)

## 2013-06-08 NOTE — Patient Instructions (Signed)
You will have labs checked today in the basement lab.  Please head down after you check out with the front desk  (lfts). Call here if you have recurrent, lasting pains.
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
## 2013-06-09 ENCOUNTER — Other Ambulatory Visit: Payer: Self-pay

## 2013-06-09 DIAGNOSIS — R7989 Other specified abnormal findings of blood chemistry: Secondary | ICD-10-CM

## 2013-06-09 DIAGNOSIS — R945 Abnormal results of liver function studies: Principal | ICD-10-CM

## 2013-06-10 ENCOUNTER — Encounter: Payer: Self-pay | Admitting: Cardiovascular Disease

## 2013-06-10 ENCOUNTER — Telehealth: Payer: Self-pay | Admitting: Gastroenterology

## 2013-06-10 ENCOUNTER — Ambulatory Visit (INDEPENDENT_AMBULATORY_CARE_PROVIDER_SITE_OTHER): Payer: Medicare Other | Admitting: Cardiovascular Disease

## 2013-06-10 VITALS — BP 180/90 | HR 77 | Ht 61.5 in | Wt 154.5 lb

## 2013-06-10 DIAGNOSIS — I452 Bifascicular block: Secondary | ICD-10-CM

## 2013-06-10 DIAGNOSIS — I4891 Unspecified atrial fibrillation: Secondary | ICD-10-CM | POA: Diagnosis not present

## 2013-06-10 DIAGNOSIS — Z950 Presence of cardiac pacemaker: Secondary | ICD-10-CM | POA: Diagnosis not present

## 2013-06-10 DIAGNOSIS — I728 Aneurysm of other specified arteries: Secondary | ICD-10-CM

## 2013-06-10 DIAGNOSIS — I480 Paroxysmal atrial fibrillation: Secondary | ICD-10-CM

## 2013-06-10 DIAGNOSIS — I4819 Other persistent atrial fibrillation: Secondary | ICD-10-CM

## 2013-06-10 LAB — PACEMAKER DEVICE OBSERVATION

## 2013-06-10 MED ORDER — ROSUVASTATIN CALCIUM 5 MG PO TABS: 5.0000 mg | ORAL_TABLET | Freq: Every evening | ORAL | Status: DC

## 2013-06-10 MED ORDER — DABIGATRAN ETEXILATE MESYLATE 75 MG PO CAPS: 75.0000 mg | ORAL_CAPSULE | Freq: Two times a day (BID) | ORAL | Status: DC
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
## 2013-06-10 NOTE — Telephone Encounter (Signed)
I don't think itching is due to the LFT abnormalities.  She can try some Benadryl lotion on her arms or over-the-counter Benadryl tabs and let's see what happens over the next day or 2.  She should have repeat LFTs next week.

## 2013-06-10 NOTE — Telephone Encounter (Signed)
Pt aware.

## 2013-06-10 NOTE — Assessment & Plan Note (Signed)
Despite previous radiofrequency ablation and treatment with a potent antiarrhythmic, she continues to have symptomatic atrial fibrillation with a burden that generally varies between 10% and 20% of the time. She is on appropriate anticoagulation without bleeding complications. Amiodarone is probably not a good option because of her recurrent problems with elevated liver function tests. I don't think there are many good options other than repeat radiofrequency ablation, which she is reluctant to undergo.

## 2013-06-10 NOTE — Assessment & Plan Note (Signed)
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 The suspicion is that her recurrent problems with abdominal pain might be related to ischemia and biliary track and/or biliary strictures. There is a plan for her to undergo repeat CT scanning of the abdomen with intravenous contrast. I recommended that she call if she has problems with persistent nausea and vomiting. She should be closely evaluated for worsening renal function because of the potential toxicity of her medications, particularly the dofetilide.
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
## 2013-06-10 NOTE — Assessment & Plan Note (Signed)
Dual-chamber [employer] ingenio model [id number],  implanted December 2013. Comprehensive in office check today shows normal device function. No changes are made to the device permanent settings. Atrial fibrillation burden 18%.

## 2013-06-10 NOTE — Patient Instructions (Signed)
Remote monitoring is used to monitor your pacemaker from home. This monitoring reduces the number of office visits required to check your device to one time per year. It allows [place] to keep an eye on the functioning of your device to ensure it is working properly. You are scheduled for a device check from home on 09-13-2013. You may send your transmission at any time that day. If you have a wireless device, the transmission will be sent automatically. After your physician reviews your transmission, you will receive a postcard with your next transmission date.  Your physician recommends that you schedule a follow-up appointment in: 12 months with Dr.Croitoru

## 2013-06-11 ENCOUNTER — Telehealth: Payer: Self-pay

## 2013-06-11 ENCOUNTER — Other Ambulatory Visit: Payer: Self-pay

## 2013-06-11 DIAGNOSIS — R933 Abnormal findings on diagnostic imaging of other parts of digestive tract: Secondary | ICD-10-CM

## 2013-06-11 NOTE — Progress Notes (Signed)
  You have been scheduled for a CT scan of the abdomen at [facility] ([address]---this is in the same building as Press [facility]).   You are scheduled on 06/14/13 at 2 pm . You should arrive 15 minutes prior to your appointment time for registration. Please follow the written instructions below on the day of your exam:  WARNING: IF YOU ARE ALLERGIC TO IODINE/X-RAY DYE, PLEASE NOTIFY RADIOLOGY IMMEDIATELY AT [phone]! YOU WILL BE GIVEN A 13 HOUR PREMEDICATION PREP.  1) Do not eat or drink anything after 10 am  (4 hours prior to your test) 2) You have been given 2 bottles of oral contrast to drink. The solution may taste better if refrigerated, but do NOT add ice or any other liquid to this solution. Shake well before drinking.    Drink 1 bottle of contrast @  12 noon (2 hours prior to your exam)  Drink 1 bottle of contrast @ 1 pm   (1 hour prior to your exam)  You may take any medications as prescribed with a small amount of water except for the following: Metformin, Glucophage, Glucovance, Avandamet, Riomet, Fortamet, Actoplus Met, Janumet, Glumetza or Metaglip. The above medications must be held the day of the exam AND 48 hours after the exam.  The purpose of you drinking the oral contrast is to aid in the visualization of your intestinal tract. The contrast solution may cause some diarrhea. Before your exam is started, you will be given a small amount of fluid to drink. Depending on your individual set of symptoms, you may also receive an intravenous injection of x-ray contrast/dye. Plan on being at [facility] for 30 minutes or long, depending on the type of exam you are having performed.  This test typically takes 30-45 minutes to complete.  If you have any questions regarding your exam or if you need to reschedule, you may call the CT department at [phone] between the hours of 8:00 am and 5:00 pm,  Monday-Friday.  ________________________________________________________________________ Pt aware and will pick up contrast today

## 2013-06-11 NOTE — Telephone Encounter (Signed)
Rose with CT called back and states that the pt does not need contrast , pt was notified

## 2013-06-14 ENCOUNTER — Ambulatory Visit (INDEPENDENT_AMBULATORY_CARE_PROVIDER_SITE_OTHER)
Admission: RE | Admit: 2013-06-14 | Discharge: 2013-06-14 | Disposition: A | Discharge status: Home / Self Care | Payer: Medicare Other | Source: Ambulatory Visit | Attending: Gastroenterology | Admitting: Gastroenterology

## 2013-06-14 ENCOUNTER — Other Ambulatory Visit: Payer: Self-pay | Admitting: *Deleted

## 2013-06-14 DIAGNOSIS — I714 Abdominal aortic aneurysm, without rupture, unspecified: Secondary | ICD-10-CM | POA: Diagnosis not present

## 2013-06-14 DIAGNOSIS — R933 Abnormal findings on diagnostic imaging of other parts of digestive tract: Secondary | ICD-10-CM | POA: Diagnosis not present

## 2013-06-14 MED ORDER — SUCRALFATE 1 G PO TABS: 1.0000 g | ORAL_TABLET | Freq: Two times a day (BID) | ORAL | Status: DC

## 2013-06-14 MED ORDER — IOHEXOL 350 MG/ML SOLN
100.0000 mL | Freq: Once | INTRAVENOUS | Status: AC | PRN
  Administered 2013-06-14: 100 mL via INTRAVENOUS

## 2013-06-15 ENCOUNTER — Other Ambulatory Visit: Payer: Self-pay

## 2013-06-15 ENCOUNTER — Telehealth: Payer: Self-pay | Admitting: *Deleted

## 2013-06-15 ENCOUNTER — Ambulatory Visit: Payer: Medicare Other

## 2013-06-15 MED ORDER — CIPROFLOXACIN HCL 500 MG PO TABS: 500.0000 mg | ORAL_TABLET | Freq: Two times a day (BID) | ORAL | Status: DC

## 2013-06-15 NOTE — Telephone Encounter (Signed)
Pradaxa 75mg  BID approved w/[employer] 06/08/13-06/08/14.

## 2013-06-15 NOTE — Telephone Encounter (Signed)
Prescription sent to the pharmacy.

## 2013-06-16 ENCOUNTER — Other Ambulatory Visit: Payer: Self-pay

## 2013-06-16 DIAGNOSIS — K8020 Calculus of gallbladder without cholecystitis without obstruction: Secondary | ICD-10-CM

## 2013-06-17 ENCOUNTER — Other Ambulatory Visit (HOSPITAL_COMMUNITY): Payer: Self-pay | Admitting: Cardiovascular Disease

## 2013-06-17 DIAGNOSIS — I701 Atherosclerosis of renal artery: Secondary | ICD-10-CM

## 2013-06-17 LAB — MDC_IDC_ENUM_SESS_TYPE_INCLINIC
Brady Statistic RA Percent Paced: 59 %
Brady Statistic RV Percent Paced: 9 %
Date Time Interrogation Session: 20150129050000
Implantable Pulse Generator Serial Number: 144309
Lead Channel Impedance Value: 595 Ohm
Lead Channel Pacing Threshold Amplitude: 0.6 V
Lead Channel Pacing Threshold Amplitude: 1.2 V
Lead Channel Pacing Threshold Pulse Width: 0.5 ms
Lead Channel Setting Pacing Amplitude: 2.5 V
Lead Channel Setting Sensing Sensitivity: 2.5 mV
MDC IDC MSMT LEADCHNL RA PACING THRESHOLD PULSEWIDTH: 0.5 ms
MDC IDC MSMT LEADCHNL RA SENSING INTR AMPL: 6 mV
MDC IDC MSMT LEADCHNL RV IMPEDANCE VALUE: 700 Ohm
MDC IDC MSMT LEADCHNL RV SENSING INTR AMPL: 8.4 mV
MDC IDC SET LEADCHNL RV PACING AMPLITUDE: 2.5 V
MDC IDC SET LEADCHNL RV PACING PULSEWIDTH: 0.5 ms
Zone Setting Detection Interval: 375 ms

## 2013-06-18 ENCOUNTER — Other Ambulatory Visit (INDEPENDENT_AMBULATORY_CARE_PROVIDER_SITE_OTHER): Payer: Medicare Other

## 2013-06-18 ENCOUNTER — Ambulatory Visit (HOSPITAL_COMMUNITY)
Admission: RE | Admit: 2013-06-18 | Discharge: 2013-06-18 | Disposition: A | Discharge status: Home / Self Care | Payer: Medicare Other | Source: Ambulatory Visit | Attending: Cardiovascular Disease | Admitting: Cardiovascular Disease

## 2013-06-18 DIAGNOSIS — I701 Atherosclerosis of renal artery: Secondary | ICD-10-CM | POA: Diagnosis not present

## 2013-06-18 DIAGNOSIS — R945 Abnormal results of liver function studies: Principal | ICD-10-CM

## 2013-06-18 DIAGNOSIS — I7789 Other specified disorders of arteries and arterioles: Secondary | ICD-10-CM | POA: Diagnosis not present

## 2013-06-18 DIAGNOSIS — R7989 Other specified abnormal findings of blood chemistry: Secondary | ICD-10-CM | POA: Diagnosis not present

## 2013-06-18 DIAGNOSIS — I10 Essential (primary) hypertension: Secondary | ICD-10-CM | POA: Diagnosis not present

## 2013-06-18 LAB — HEPATIC FUNCTION PANEL
ALK PHOS: 172 U/L — AB (ref 39–117)
ALT: 37 U/L — AB (ref 0–35)
AST: 36 U/L (ref 0–37)
Albumin: 3.6 g/dL (ref 3.5–5.2)
Bilirubin, Direct: 0.5 mg/dL — ABNORMAL HIGH (ref 0.0–0.3)
Total Bilirubin: 1.6 mg/dL — ABNORMAL HIGH (ref 0.3–1.2)
Total Protein: 6.7 g/dL (ref 6.0–8.3)

## 2013-06-18 NOTE — Progress Notes (Signed)
Renal Duplex Completed. Eloise Mula, BS, RDMS, RVT  

## 2013-06-22 ENCOUNTER — Encounter (HOSPITAL_COMMUNITY): Payer: Self-pay | Admitting: *Deleted

## 2013-06-22 ENCOUNTER — Ambulatory Visit (INDEPENDENT_AMBULATORY_CARE_PROVIDER_SITE_OTHER): Payer: Medicare Other

## 2013-06-22 DIAGNOSIS — J309 Allergic rhinitis, unspecified: Secondary | ICD-10-CM

## 2013-06-23 ENCOUNTER — Encounter (HOSPITAL_COMMUNITY): Payer: Self-pay

## 2013-06-23 ENCOUNTER — Ambulatory Visit (HOSPITAL_COMMUNITY): Payer: Medicare Other

## 2013-06-23 ENCOUNTER — Encounter (HOSPITAL_COMMUNITY)
Admission: RE | Disposition: A | Discharge status: Home / Self Care | Payer: Self-pay | Source: Ambulatory Visit | Attending: Gastroenterology

## 2013-06-23 ENCOUNTER — Ambulatory Visit (HOSPITAL_COMMUNITY): Payer: Medicare Other | Admitting: Anesthesiology

## 2013-06-23 ENCOUNTER — Encounter (HOSPITAL_COMMUNITY): Payer: Medicare Other | Admitting: Anesthesiology

## 2013-06-23 ENCOUNTER — Ambulatory Visit (HOSPITAL_COMMUNITY)
Admission: RE | Admit: 2013-06-23 | Discharge: 2013-06-23 | Disposition: A | Discharge status: Home / Self Care | Payer: Medicare Other | Source: Ambulatory Visit | Attending: Gastroenterology | Admitting: Gastroenterology

## 2013-06-23 DIAGNOSIS — K8020 Calculus of gallbladder without cholecystitis without obstruction: Secondary | ICD-10-CM

## 2013-06-23 DIAGNOSIS — Z7901 Long term (current) use of anticoagulants: Secondary | ICD-10-CM | POA: Insufficient documentation

## 2013-06-23 DIAGNOSIS — K449 Diaphragmatic hernia without obstruction or gangrene: Secondary | ICD-10-CM | POA: Insufficient documentation

## 2013-06-23 DIAGNOSIS — J4489 Other specified chronic obstructive pulmonary disease: Secondary | ICD-10-CM | POA: Insufficient documentation

## 2013-06-23 DIAGNOSIS — K8050 Calculus of bile duct without cholangitis or cholecystitis without obstruction: Secondary | ICD-10-CM

## 2013-06-23 DIAGNOSIS — I10 Essential (primary) hypertension: Secondary | ICD-10-CM | POA: Diagnosis not present

## 2013-06-23 DIAGNOSIS — K219 Gastro-esophageal reflux disease without esophagitis: Secondary | ICD-10-CM | POA: Diagnosis not present

## 2013-06-23 DIAGNOSIS — J449 Chronic obstructive pulmonary disease, unspecified: Secondary | ICD-10-CM | POA: Insufficient documentation

## 2013-06-23 DIAGNOSIS — Z87891 Personal history of nicotine dependence: Secondary | ICD-10-CM | POA: Diagnosis not present

## 2013-06-23 DIAGNOSIS — I4891 Unspecified atrial fibrillation: Secondary | ICD-10-CM | POA: Insufficient documentation

## 2013-06-23 DIAGNOSIS — K838 Other specified diseases of biliary tract: Secondary | ICD-10-CM | POA: Diagnosis not present

## 2013-06-23 HISTORY — DX: Presence of cardiac pacemaker: Z95.0

## 2013-06-23 HISTORY — DX: Unspecified intracranial injury with loss of consciousness of unspecified duration, initial encounter: S06.9X9A

## 2013-06-23 HISTORY — PX: ERCP: SHX5425

## 2013-06-23 LAB — POCT I-STAT 4, (NA,K, GLUC, HGB,HCT)
Glucose, Bld: 86 mg/dL (ref 70–99)
HEMATOCRIT: 45 % (ref 36.0–46.0)
HEMOGLOBIN: 15.3 g/dL — AB (ref 12.0–15.0)
POTASSIUM: 4 meq/L (ref 3.7–5.3)
Sodium: 144 mEq/L (ref 137–147)

## 2013-06-23 SURGERY — ERCP, WITH INTERVENTION IF INDICATED
Anesthesia: General

## 2013-06-23 MED ORDER — LACTATED RINGERS IV SOLN
INTRAVENOUS | Status: DC | PRN
  Administered 2013-06-23: 13:00:00 via INTRAVENOUS

## 2013-06-23 MED ORDER — PHENYLEPHRINE HCL 10 MG/ML IJ SOLN
10.0000 mg | INTRAVENOUS | Status: DC | PRN
  Administered 2013-06-23: 20 ug/min via INTRAVENOUS

## 2013-06-23 MED ORDER — GLUCAGON HCL (RDNA) 1 MG IJ SOLR
INTRAMUSCULAR | Status: AC
  Filled 2013-06-23: qty 2

## 2013-06-23 MED ORDER — NEOSTIGMINE METHYLSULFATE 1 MG/ML IJ SOLN
INTRAMUSCULAR | Status: DC | PRN
  Administered 2013-06-23: 3 mg via INTRAVENOUS

## 2013-06-23 MED ORDER — LIDOCAINE HCL (CARDIAC) 20 MG/ML IV SOLN
INTRAVENOUS | Status: DC | PRN
  Administered 2013-06-23: 30 mg via INTRAVENOUS

## 2013-06-23 MED ORDER — ROCURONIUM BROMIDE 100 MG/10ML IV SOLN
INTRAVENOUS | Status: DC | PRN
  Administered 2013-06-23: 30 mg via INTRAVENOUS

## 2013-06-23 MED ORDER — LACTATED RINGERS IV SOLN
INTRAVENOUS | Status: DC
  Administered 2013-06-23: 12:00:00 via INTRAVENOUS

## 2013-06-23 MED ORDER — SODIUM CHLORIDE 0.9 % IV SOLN: INTRAVENOUS | Status: DC

## 2013-06-23 MED ORDER — PROPOFOL 10 MG/ML IV BOLUS
INTRAVENOUS | Status: DC | PRN
  Administered 2013-06-23: 70 mg via INTRAVENOUS

## 2013-06-23 MED ORDER — GLUCAGON HCL (RDNA) 1 MG IJ SOLR
INTRAMUSCULAR | Status: DC | PRN
  Administered 2013-06-23: 1 mg via INTRAVENOUS

## 2013-06-23 MED ORDER — ONDANSETRON HCL 4 MG/2ML IJ SOLN
INTRAMUSCULAR | Status: DC | PRN
  Administered 2013-06-23: 4 mg via INTRAVENOUS

## 2013-06-23 MED ORDER — CIPROFLOXACIN IN D5W 400 MG/200ML IV SOLN
400.0000 mg | Freq: Once | INTRAVENOUS | Status: AC
  Administered 2013-06-23: 400 mg via INTRAVENOUS
  Filled 2013-06-23: qty 200

## 2013-06-23 MED ORDER — IOHEXOL 350 MG/ML SOLN
INTRAVENOUS | Status: DC | PRN
  Administered 2013-06-23: 15:00:00

## 2013-06-23 MED ORDER — FENTANYL CITRATE 0.05 MG/ML IJ SOLN
INTRAMUSCULAR | Status: DC | PRN
  Administered 2013-06-23: 50 ug via INTRAVENOUS

## 2013-06-23 MED ORDER — GLYCOPYRROLATE 0.2 MG/ML IJ SOLN
INTRAMUSCULAR | Status: DC | PRN
  Administered 2013-06-23: .4 mg via INTRAVENOUS
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
## 2013-06-23 NOTE — Anesthesia Preprocedure Evaluation (Addendum)
Anesthesia Evaluation  Patient identified by MRN, date of birth, ID band Patient awake    Reviewed: Allergy & Precautions, H&P , NPO status , Patient's Chart, lab work & pertinent test results, reviewed documented beta blocker date and time   History of Anesthesia Complications Negative for: history of anesthetic complications  Airway Mallampati: II TM Distance: >3 FB Neck ROM: Full    Dental  (+) Edentulous Upper, Partial Lower, Dental Advisory Given   Pulmonary COPD COPD inhaler, former smoker (quit '84),  breath sounds clear to auscultation        Cardiovascular hypertension, Pt. on medications and Pt. on home beta blockers + CAD (non-obstructive by cath) + dysrhythmias (pradaxa) Atrial Fibrillation + pacemaker + Valvular Problems/Murmurs Rhythm:Irregular Rate:Normal  '12 Stress: normal perfusion and LVF, no ischemia '12 ECHO: EF 60-65%, valves OK   Neuro/Psych TIA   GI/Hepatic GERD-  Medicated,Elevated LFTs   Endo/Other  negative endocrine ROS  Renal/GU negative Renal ROS     Musculoskeletal   Abdominal   Peds  Hematology negative hematology ROS (+)   Anesthesia Other Findings   Reproductive/Obstetrics                          Anesthesia Physical Anesthesia Plan  ASA: III  Anesthesia Plan: General   Post-op Pain Management:    Induction: Intravenous  Airway Management Planned: Oral ETT  Additional Equipment:   Intra-op Plan:   Post-operative Plan: Extubation in OR  Informed Consent: I have reviewed the patients History and Physical, chart, labs and discussed the procedure including the risks, benefits and alternatives for the proposed anesthesia with the patient or authorized representative who has indicated his/her understanding and acceptance.   Dental advisory given  Plan Discussed with: CRNA and Surgeon  Anesthesia Plan Comments: (Present for induction, easy mask, BBS=,  +ETCO2 after atraum intubation)        Anesthesia Quick Evaluation

## 2013-06-23 NOTE — Transfer of Care (Signed)
Immediate Anesthesia Transfer of Care Note  Patient: [name]  Procedure(s) Performed: Procedure(s): ENDOSCOPIC RETROGRADE CHOLANGIOPANCREATOGRAPHY (ERCP) (N/A)  Patient Location: PACU and Endoscopy Unit  Anesthesia Type:General  Level of Consciousness: awake, alert , sedated and patient cooperative  Airway & Oxygen Therapy: Patient Spontanous Breathing and Patient connected to nasal cannula oxygen  Post-op Assessment: Report given to PACU RN  Post vital signs: Reviewed and stable  Complications: No apparent anesthesia complications

## 2013-06-23 NOTE — Op Note (Signed)
[facility] [place], [address]   ERCP PROCEDURE REPORT  PATIENT: [name], [name].  MR# :[id number] BIRTHDATE: [birth year]  GENDER: Female ENDOSCOPIST: Inda Castle, MD REFERRED BY: PROCEDURE DATE:  06/23/2013 PROCEDURE:   ERCP with removal of calculus/calculi and ERCP with destruction of calculus/calculi ASA CLASS:   Class III INDICATIONS:suspected or rule out bile duct stones. MEDICATIONS: MAC sedation, administered by CRNA and Cipro 400 mg IV  TOPICAL ANESTHETIC:  DESCRIPTION OF PROCEDURE:   After the risks benefits and alternatives of the procedure were thoroughly explained, informed consent was obtained.  The     endoscope was introduced through the mouth  and advanced to the third portion of the duodenum .  1.  A moderate size periampullary diverticulum was seen.  The ampulla was identified on the proximal wall of the diverticulum. The patient had a prior sphincterotomy.  The bile duct was directly cannulated with a 0.35 mm guidewire.  Injection demonstrated at least 2 filling defects measuring 12-15 mm. Several attempts were made to remove the stones with the balloon stone extractor.  These were unsuccessful.  A basket lithotripter was then inserted into the duct and the stones were captured and fragmented.  Following this stone fragments were removed utilizing the 15 and 12 mm balloon stone extractors.  Final cholangiogram showed no residual stones or fragments.  The scope was then completely withdrawn from the patient and the procedure terminated.     COMPLICATIONS:  ENDOSCOPIC IMPRESSION: 1.  multiple bile duct stones-status post lithotripsy and stone extraction  RECOMMENDATIONS: 1.  office visit 10-14 days 2.  repeat LFTs prior to office visit    _______________________________ eSigned:  Inda Castle, MD 06/23/2013 2:47 PM   CC:  PATIENT NAME:  Natasha, [name] MR#: [id number]

## 2013-06-23 NOTE — Interval H&P Note (Signed)
History and Physical Interval Note:  06/23/2013 1:31 PM  Blanche [place] B[place]  has presented today for surgery, with the diagnosis of 574.20  The various methods of treatment have been discussed with the patient and family. After consideration of risks, benefits and other options for treatment, the patient has consented to  Procedure(s): ENDOSCOPIC RETROGRADE CHOLANGIOPANCREATOGRAPHY (ERCP) (N/A) as a surgical intervention .  The patient's history has been reviewed, patient examined, no change in status, stable for surgery.  I have reviewed the patient's chart and labs.  Questions were answered to the patient's satisfaction.     The recent H&P (dated *06/08/13**) was reviewed, the patient was examined and there is no change in the patients condition since that H&P was completed.  CT scan demonstrated stones in the CBD   Erskine Emery  06/23/2013, 1:31 PM   Erskine Emery

## 2013-06-23 NOTE — Anesthesia Postprocedure Evaluation (Signed)
  Anesthesia Post-op Note  Patient: [name]  Procedure(s) Performed: Procedure(s): ENDOSCOPIC RETROGRADE CHOLANGIOPANCREATOGRAPHY (ERCP) (N/A)  Patient Location: Endoscopy Unit  Anesthesia Type:General  Level of Consciousness: awake, alert , oriented and patient cooperative  Airway and Oxygen Therapy: Patient Spontanous Breathing and Patient connected to nasal cannula oxygen  Post-op Pain: none  Post-op Assessment: Post-op Vital signs reviewed, Patient's Cardiovascular Status Stable, Respiratory Function Stable, Patent Airway, No signs of Nausea or vomiting and Pain level controlled  Post-op Vital Signs: Reviewed and stable  Complications: No apparent anesthesia complications

## 2013-06-23 NOTE — Discharge Instructions (Signed)
General Anesthesia, Adult, Care After Refer to this sheet in the next few weeks. These instructions provide you with information on caring for yourself after your procedure. Your health care provider may also give you more specific instructions. Your treatment has been planned according to current medical practices, but problems sometimes occur. Call your health care provider if you have any problems or questions after your procedure. WHAT TO EXPECT AFTER THE PROCEDURE After the procedure, it is typical to experience:  Sleepiness.  Nausea and vomiting. HOME CARE INSTRUCTIONS  For the first 24 hours after general anesthesia:  Have a responsible person with you.  Do not drive a car. If you are alone, do not take public transportation.  Do not drink alcohol.  Do not take medicine that has not been prescribed by your health care provider.  Do not sign important papers or make important decisions.  You may resume a normal diet and activities as directed by your health care provider.  Change bandages (dressings) as directed.  If you have questions or problems that seem related to general anesthesia, call the hospital and ask for the anesthetist or anesthesiologist on call. SEEK MEDICAL CARE IF:  You have nausea and vomiting that continue the day after anesthesia.  You develop a rash. SEEK IMMEDIATE MEDICAL CARE IF:   You have difficulty breathing.  You have chest pain.  You have any allergic problems. Document Released: 08/05/2000 Document Revised: 12/30/2012 Document Reviewed: 11/12/2012 [facility] Patient Information 2014 [place], [place]. Gastrointestinal Endoscopy Care After Refer to this sheet in the next few weeks. These instructions provide you with information on caring for yourself after your procedure. Your caregiver may also give you more specific instructions. Your treatment has been planned according to current medical practices, but problems sometimes occur. Call your  caregiver if you have any problems or questions after your procedure. HOME CARE INSTRUCTIONS  If you were given medicine to help you relax (sedative), do not drive, operate machinery, or sign important documents for 24 hours.  Avoid alcohol and hot or warm beverages for the first 24 hours after the procedure.  Only take over-the-counter or prescription medicines for pain, discomfort, or fever as directed by your caregiver. You may resume taking your normal medicines unless your caregiver tells you otherwise. Ask your caregiver when you may resume taking medicines that may cause bleeding, such as aspirin, clopidogrel, or warfarin.  You may return to your normal diet and activities on the day after your procedure, or as directed by your caregiver. Walking may help to reduce any bloated feeling in your abdomen.  Drink enough fluids to keep your urine clear or pale yellow.  You may gargle with salt water if you have a sore throat. SEEK IMMEDIATE MEDICAL CARE IF:  You have severe nausea or vomiting.  You have severe abdominal pain, abdominal cramps that last longer than 6 hours, or abdominal swelling (distention).  You have severe shoulder or back pain.  You have trouble swallowing.  You have shortness of breath, your breathing is shallow, or you are breathing faster than normal.  You have a fever or a rapid heartbeat.  You vomit blood or material that looks like coffee grounds.  You have bloody, black, or tarry stools. MAKE SURE YOU:  Understand these instructions.  Will watch your condition.  Will get help right away if you are not doing well or get worse. Document Released: 12/12/2003 Document Revised: 10/29/2011 Document Reviewed: 07/30/2011 [facility] Patient Information 2014 [place], [place].

## 2013-06-24 ENCOUNTER — Telehealth: Payer: Self-pay | Admitting: Gastroenterology

## 2013-06-24 ENCOUNTER — Encounter (HOSPITAL_COMMUNITY): Payer: Self-pay | Admitting: Gastroenterology

## 2013-06-24 ENCOUNTER — Telehealth: Payer: Self-pay

## 2013-06-24 ENCOUNTER — Ambulatory Visit (INDEPENDENT_AMBULATORY_CARE_PROVIDER_SITE_OTHER): Payer: Medicare Other | Admitting: Gastroenterology

## 2013-06-24 ENCOUNTER — Other Ambulatory Visit (INDEPENDENT_AMBULATORY_CARE_PROVIDER_SITE_OTHER): Payer: Medicare Other

## 2013-06-24 ENCOUNTER — Other Ambulatory Visit: Payer: Self-pay

## 2013-06-24 VITALS — BP 122/76 | HR 82 | Ht 61.5 in | Wt 156.0 lb

## 2013-06-24 DIAGNOSIS — R109 Unspecified abdominal pain: Secondary | ICD-10-CM | POA: Diagnosis not present

## 2013-06-24 DIAGNOSIS — R1013 Epigastric pain: Secondary | ICD-10-CM

## 2013-06-24 DIAGNOSIS — I701 Atherosclerosis of renal artery: Secondary | ICD-10-CM

## 2013-06-24 LAB — HEPATIC FUNCTION PANEL
ALBUMIN: 3.4 g/dL — AB (ref 3.5–5.2)
ALK PHOS: 122 U/L — AB (ref 39–117)
ALT: 27 U/L (ref 0–35)
AST: 44 U/L — AB (ref 0–37)
BILIRUBIN DIRECT: 0.5 mg/dL — AB (ref 0.0–0.3)
BILIRUBIN TOTAL: 1.4 mg/dL — AB (ref 0.3–1.2)
Total Protein: 6.3 g/dL (ref 6.0–8.3)

## 2013-06-24 LAB — AMYLASE: Amylase: 1142 U/L — ABNORMAL HIGH (ref 27–131)

## 2013-06-24 LAB — LIPASE: Lipase: 1342 U/L — ABNORMAL HIGH (ref 11.0–59.0)

## 2013-06-24 MED ORDER — OXYCODONE-ACETAMINOPHEN 5-325 MG PO TABS: 1.0000 | ORAL_TABLET | ORAL | Status: DC | PRN

## 2013-06-24 NOTE — Telephone Encounter (Signed)
Amylase, lipase and LFTs.  I can see her about 4 PM today

## 2013-06-24 NOTE — Patient Instructions (Addendum)
We are printing a pain medication prescription for you Follow up with Dr Deatra Ina on 07/23/2013 at 2:30pm Call us and let us know how you are doing tomorrow Stay on a clear liquid diet for 2 days or until you start to feel better

## 2013-06-24 NOTE — Progress Notes (Signed)
          History of Present Illness:  The patient was seen as an emergency work in for evaluation of abdominal pain.  Yesterday she underwent ERCP with extraction of multiple bile duct stones.  We'll discomfort following the procedure.  This morning approximately 8:30 she developed moderately severe midepigastric pain.  Pain is reminiscent of her prior episodes of pain.  She's had no nausea, vomiting or fever.    Review of Systems: Pertinent positive and negative review of systems were noted in the above HPI section. All other review of systems were otherwise negative.    Current Medications, Allergies, Past Medical History, Past Surgical History, Family History and Social History were reviewed in [facility] record  Vital signs were reviewed in today's medical record. Physical Exam: General: Well developed , well nourished, no acute distress Skin: anicteric Head: Normocephalic and atraumatic Eyes:  sclerae anicteric, EOMI Ears: Normal auditory acuity Mouth: No deformity or lesions Lungs: Clear throughout to auscultation Heart: Regular rate and rhythm; no murmurs, rubs or bruits Abdomen: Soft,  and non distended. No masses, hepatosplenomegaly or hernias noted. Normal Bowel sounds.  There is moderate tenderness to palpation in the midepigastrium without guarding or rebound Rectal:deferred Musculoskeletal: Symmetrical with no gross deformities  Pulses:  Normal pulses noted Extremities: No clubbing, cyanosis, edema or deformities noted Neurological: Alert oriented x 4, grossly nonfocal Psychological:  Alert and cooperative. Normal mood and affect  See Assessment and Plan under Problem List

## 2013-06-24 NOTE — Telephone Encounter (Signed)
Pt had ERCP yesterday. States she is having pain all across her stomach almost like she is having a "gallbladder attack." States she had to take a pain pill but is still hurting. There are no OV appts available with [facility].  Please advise.

## 2013-06-24 NOTE — Assessment & Plan Note (Signed)
Abdominal pain posterior CP.  Although severe pain began approximately 8 hours following the procedure I suspect that she has mild post ERCP pancreatitis.  Retained bile duct stone is less likely.  Recommendations #1 stat amylase, lipase and LFTs #2 analgesics.  Patient was instructed to stay on clear liquid diet #3 if pancreatic enzymes are normal and LFTs are abnormal then she will require repeat imaging by ERCP for suspected retained stone.  If it appears that she has acute pancreatitis I will try to manage as an outpatient since pain is not too severe and she is able to take liquids.

## 2013-06-24 NOTE — Telephone Encounter (Signed)
Dr. Deatra Ina aware of labs. Pt knows pancreatitis and instructed to follow OV instructions and call in the am with an update. She verbalized understanding.

## 2013-06-24 NOTE — Telephone Encounter (Signed)
Pt scheduled to see Dr. Deatra Ina today at [place], orders in epic, pt to go to lab prior to appt.

## 2013-06-25 ENCOUNTER — Telehealth: Payer: Self-pay | Admitting: *Deleted

## 2013-06-25 ENCOUNTER — Encounter: Payer: Self-pay | Admitting: *Deleted

## 2013-06-25 ENCOUNTER — Telehealth: Payer: Self-pay | Admitting: Gastroenterology

## 2013-06-25 DIAGNOSIS — I701 Atherosclerosis of renal artery: Secondary | ICD-10-CM

## 2013-06-25 NOTE — Telephone Encounter (Signed)
OK.  Stay on clears until pain begins to substantially subside.

## 2013-06-25 NOTE — Telephone Encounter (Signed)
Spoke with pt and she is aware.

## 2013-06-25 NOTE — Telephone Encounter (Signed)
Message copied by Chauncy Lean on Fri Jun 25, 2013 12:42 PM ------      Message from: Lorretta Harp      Created: Wed Jun 23, 2013  6:51 PM       No change from prior study. Repeat in 12 months. ------

## 2013-06-25 NOTE — Telephone Encounter (Signed)
Pt states she woke up this morning at 5am and was hurting, she took a pain pill and feels much better. Pt states she feels good right now. Pt instructed to call back if she has any further problems.

## 2013-06-25 NOTE — Telephone Encounter (Signed)
Order placed for repeat renal dopplers in 1 year  

## 2013-06-29 ENCOUNTER — Ambulatory Visit: Payer: Medicare Other

## 2013-07-06 ENCOUNTER — Other Ambulatory Visit: Payer: Medicare Other

## 2013-07-06 ENCOUNTER — Encounter: Payer: Self-pay | Admitting: Physician Assistant

## 2013-07-06 ENCOUNTER — Other Ambulatory Visit: Payer: Self-pay | Admitting: *Deleted

## 2013-07-06 ENCOUNTER — Ambulatory Visit: Payer: Medicare Other

## 2013-07-06 ENCOUNTER — Ambulatory Visit (INDEPENDENT_AMBULATORY_CARE_PROVIDER_SITE_OTHER): Payer: Medicare Other | Admitting: Physician Assistant

## 2013-07-06 VITALS — BP 128/76 | HR 66 | Ht 61.0 in | Wt 154.6 lb

## 2013-07-06 DIAGNOSIS — K859 Acute pancreatitis without necrosis or infection, unspecified: Secondary | ICD-10-CM | POA: Diagnosis not present

## 2013-07-06 DIAGNOSIS — R7989 Other specified abnormal findings of blood chemistry: Secondary | ICD-10-CM | POA: Diagnosis not present

## 2013-07-06 DIAGNOSIS — R945 Abnormal results of liver function studies: Secondary | ICD-10-CM

## 2013-07-06 DIAGNOSIS — I701 Atherosclerosis of renal artery: Secondary | ICD-10-CM | POA: Diagnosis not present

## 2013-07-06 DIAGNOSIS — K8050 Calculus of bile duct without cholangitis or cholecystitis without obstruction: Secondary | ICD-10-CM

## 2013-07-06 LAB — HEPATIC FUNCTION PANEL
ALK PHOS: 85 U/L (ref 39–117)
ALT: 25 U/L (ref 0–35)
AST: 37 U/L (ref 0–37)
Albumin: 3.7 g/dL (ref 3.5–5.2)
Bilirubin, Direct: 0.2 mg/dL (ref 0.0–0.3)
Total Bilirubin: 1 mg/dL (ref 0.3–1.2)
Total Protein: 6.4 g/dL (ref 6.0–8.3)

## 2013-07-06 NOTE — Progress Notes (Signed)
Reviewed and agree with management. Ophie Burrowes D. Uzziel Russey, M.D., FACG  

## 2013-07-06 NOTE — Patient Instructions (Signed)
Please go to the basement level to have your labs drawn.  Advance your diet as tolerated. Follow up with Dr. Erskine Emery as needed.

## 2013-07-06 NOTE — Progress Notes (Signed)
Subjective:    Patient ID: [name], female    DOB: 01-17-1926, 80 y.o.   MRN: [id number]  HPI   [name] is a very nice 80 year old white female known to Dr. Deatra Ina who is status post remote cholecystectomy and had been having episodes of recurrent severe right upper quadrant pain over the past multiple months. She had an ultrasound in December of 2014 it did show a dilated bile duct measuring 10.6-12.8 mm. She has not had also had intermittent liver function study elevation. She underwent CT of the abdomen and pelvis on 06/16/2013. She had prior history of hepatic artery aneurysm. There was no evidence of recurrent hepatic artery aneurysm prior coil embolization noted. She had developing aneurysmal dilation of the anterior abdominal aorta maximum of 3 cm, there was fibromuscular dysplasia of the renal arteries. And also interval development of high attenuation material within the mid and distal common bile duct concerning for choledocholithiasis. No evidence for recurrent renal cell cancer. She subsequently underwent ERCP with sphincterotomy and removal of multiple common bile duct stones on 06/23/2012 with Dr. Deatra Ina. She tolerated the procedure well but did develop abdominal pain several hours later. She was seen back in the office the following day and had labs consistent with a post-ERCP pancreatitis. Lipase was 1342, amylase 1142, total bilirubin 1.4 alkaline phosphatase 122 AST of 44 and ALT of 27. She was given analgesics and asked to stay on a clear liquid diet for a few days with office followup. She comes back in today for followup of bowel 12 days later and says that she was miserable for the first several days with ongoing abdominal pain nausea and decrease in appetite. She says her symptoms gradually improved and over the past few day she has been feeling much better. She says she is eating and drinking without difficulty and has no residual abdominal pain. She says she feels that she has  felt in sometime..     Review of Systems  Constitutional: Negative.   HENT: Negative.   Eyes: Negative.   Respiratory: Negative.   Cardiovascular: Negative.   Gastrointestinal: Negative.   Endocrine: Negative.   Genitourinary: Negative.   Musculoskeletal: Negative.   Skin: Negative.   Allergic/Immunologic: Negative.   Neurological: Negative.   Hematological: Negative.   Psychiatric/Behavioral: Negative.    Outpatient Prescriptions Prior to Visit  Medication Sig Dispense Refill  . Calcium Carbonate-Vitamin D (CALCIUM + D PO) Take 1 tablet by mouth 2 (two) times daily.      . dabigatran (PRADAXA) 75 MG CAPS capsule Take 1 capsule (75 mg total) by mouth every 12 (twelve) hours.  60 capsule  0  . dofetilide (TIKOSYN) 250 MCG capsule Take 1 capsule (250 mcg total) by mouth 2 (two) times daily. Schedule is 8am and 8pm and cannot vary more than 1 hour  60 capsule  6  . furosemide (LASIX) 40 MG tablet Take 1 tablet (40 mg total) by mouth daily.  30 tablet  10  . loratadine (CLARITIN) 10 MG tablet Take 10 mg by mouth daily as needed for allergies.       Marland Kitchen loteprednol (LOTEMAX) 0.5 % ophthalmic suspension Place 1 drop into both eyes every evening.       . magnesium oxide (MAG-OX) 400 MG tablet Take 1 tablet (400 mg total) by mouth daily.  30 tablet  6  . metoprolol succinate (TOPROL-XL) 50 MG 24 hr tablet Take 50 mg by mouth 2 (two) times daily. Take with or immediately  following a meal.      . Multiple Vitamins-Minerals (MULTIVITAMINS THER. W/MINERALS) TABS Take 1 tablet by mouth every morning.        . Omega-3 Fatty Acids (FISH OIL) 1000 MG CAPS Take 1 capsule by mouth 2 (two) times daily.       Marland Kitchen oxyCODONE-acetaminophen (PERCOCET) 5-325 MG per tablet Take 1 tablet by mouth every 4 (four) hours as needed for moderate pain.  30 tablet  0  . potassium chloride SA (K-DUR,KLOR-CON) 20 MEQ tablet Take 1 tablet (20 mEq total) by mouth daily.  30 tablet  7  . ranitidine (ZANTAC) 150 MG capsule  Take 150 mg by mouth every morning.       . rosuvastatin (CRESTOR) 5 MG tablet Take 1 tablet (5 mg total) by mouth every evening.  28 tablet  0  . sucralfate (CARAFATE) 1 G tablet Take 1 tablet (1 g total) by mouth 2 (two) times daily.  60 tablet  5  . ciprofloxacin (CIPRO) 500 MG tablet Take 1 tablet (500 mg total) by mouth 2 (two) times daily.  28 tablet  0   No facility-administered medications prior to visit.   Allergies  Allergen Reactions  . Protonix [Pantoprazole Sodium] Other (See Comments)    Abdominal pain   Patient Active Problem List   Diagnosis Date Noted  . Calculus of bile duct without mention of cholecystitis or obstruction 06/23/2013  . Pacemaker 12/07/2012  . Hyperlipidemia 12/07/2012  . Cerebral embolism with cerebral infarction 01/22/2012  . Chest pain of pericarditis 01/20/2012  . Acute CHF, presumed secondary to Rt heart failure 01/20/2012  . Near syncope 01/17/2012  . Cardiac tamponade, recurrent episode, admitted 9/5, but now felt to be pericarditits 01/17/2012  . PAF, hx of RFA at [facility] complicated by tamponade 01/05/2012  . CAD, mild by cath 2001 and 09/04/11 09/05/2011  . COPD, by CXR, followed by Dr Annamaria Boots 09/05/2011  . Syncope, after NTG X 1 on admission 09/05/2011  . Chest pain 09/03/2011  . Supra theraputic INR 5.5 01/16/12 09/03/2011  . HTN (hypertension) 09/03/2011  . Nl coronaries 2001, low risk Myoview 2012 09/03/2011  . Dyspnea on exertion, secondary to AF 09/03/2011  . RBBB, intermittent 09/03/2011  . Personal history of colonic polyps 06/21/2011  . Right bundle branch block and left anterior fascicular block 03/19/2011  . Allergic rhinitis due to pollen 07/17/2010  . GERD 10/25/2009  . ABDOMINAL PAIN-EPIGASTRIC 10/25/2009  . 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 EUSTACHIAN TUBE DYSFUNCTION 11/26/2007  . Persistent atrial fibrillation, failed DCCV 11/12, turned down for RFA 11/26/2007  . DIVERTICULOSIS, COLON 07/03/2006   History    Substance Use Topics  . Smoking status: Former Smoker    Quit date: 06/10/1982  . Smokeless tobacco: Never Used     Comment: former smoker x 22+ years, positive for second-hand smoke exposure  . Alcohol Use: No   family history includes Arthritis in her sister; Atrial fibrillation in her sister; Heart disease in her mother; Heart failure in her father; Prostate cancer in her father. There is no history of Colon cancer, Anesthesia problems, Hypotension, Malignant hyperthermia, or Pseudochol deficiency.     Objective:   Physical Exam  well-developed elderly white female in no acute distress, pleasant blood pressure 120/76 pulse 66 HEENT; nontraumatic normocephalic EOMI PERRLA sclera anicteric, Supple no JVD, Cardiovascular; regular rate and rhythm with S1-S2 no murmur or gallop, Pulmonary; clear bilaterally, Abdomen; soft nontender nondistended bowel sounds are active there is no  palpable mass or hepatosplenomegaly bowel sounds are present, Rectal; exam not done, Ext; no clubbing cyanosis or edema skin warm dry, Psych ;mood and affect appropriate.      Assessment & Plan: # # #1  #37     #53  80 year old white female with resolved post ERCP pancreatitis status post ERCP sphincterotomy and extraction of multiple common bile duct stones on 06/23/2013. Patient much improved and asymptomatic currently #2 elevated LFTs secondary to #1 #3 status post remote cholecystectomy #4 coronary artery disease #5 COPD #6 history of hepatic artery aneurysm status post calling and currently with a small abdominal aortic aneurysm measuring 3 cm there is also fibromuscular dysplasia of the renal arteries #7 history of renal cell CA  Plan; check repeat hepatic panel today Advance diet gradually to regular low-fat Patient will followup with Dr. Deatra Ina on an as-needed basis.

## 2013-07-13 ENCOUNTER — Ambulatory Visit (INDEPENDENT_AMBULATORY_CARE_PROVIDER_SITE_OTHER): Payer: Medicare Other

## 2013-07-13 DIAGNOSIS — J309 Allergic rhinitis, unspecified: Secondary | ICD-10-CM | POA: Diagnosis not present

## 2013-07-20 ENCOUNTER — Ambulatory Visit: Payer: Medicare Other

## 2013-07-23 ENCOUNTER — Ambulatory Visit: Payer: Medicare Other | Admitting: Gastroenterology

## 2013-07-27 ENCOUNTER — Ambulatory Visit (INDEPENDENT_AMBULATORY_CARE_PROVIDER_SITE_OTHER): Payer: Medicare Other

## 2013-07-27 DIAGNOSIS — J309 Allergic rhinitis, unspecified: Secondary | ICD-10-CM | POA: Diagnosis not present

## 2013-07-30 ENCOUNTER — Ambulatory Visit (INDEPENDENT_AMBULATORY_CARE_PROVIDER_SITE_OTHER): Payer: Medicare Other

## 2013-07-30 DIAGNOSIS — J309 Allergic rhinitis, unspecified: Secondary | ICD-10-CM | POA: Diagnosis not present

## 2013-07-31 ENCOUNTER — Other Ambulatory Visit: Payer: Self-pay | Admitting: Cardiovascular Disease

## 2013-07-31 NOTE — Telephone Encounter (Signed)
Rx was sent to pharmacy electronically. 

## 2013-08-03 ENCOUNTER — Ambulatory Visit (INDEPENDENT_AMBULATORY_CARE_PROVIDER_SITE_OTHER): Payer: Medicare Other

## 2013-08-03 DIAGNOSIS — J309 Allergic rhinitis, unspecified: Secondary | ICD-10-CM | POA: Diagnosis not present

## 2013-08-09 ENCOUNTER — Other Ambulatory Visit: Payer: Self-pay | Admitting: Cardiovascular Disease

## 2013-08-09 NOTE — Telephone Encounter (Signed)
Rx was sent to pharmacy electronically. 

## 2013-08-10 ENCOUNTER — Ambulatory Visit (INDEPENDENT_AMBULATORY_CARE_PROVIDER_SITE_OTHER): Payer: Medicare Other

## 2013-08-10 DIAGNOSIS — J309 Allergic rhinitis, unspecified: Secondary | ICD-10-CM

## 2013-08-17 ENCOUNTER — Ambulatory Visit (INDEPENDENT_AMBULATORY_CARE_PROVIDER_SITE_OTHER): Payer: Medicare Other

## 2013-08-17 DIAGNOSIS — J309 Allergic rhinitis, unspecified: Secondary | ICD-10-CM

## 2013-08-19 ENCOUNTER — Telehealth: Payer: Self-pay | Admitting: Radiology

## 2013-08-19 NOTE — Telephone Encounter (Signed)
Called pt to ck status.  She just had stones removed from her CBD by Dr Deatra Ina.  CT Angio Abd was performed on 06-14-13.  She denies any pain, N &V and feels better from the procedure.   We will check with Dr Kathlene Cote to see if any follow-up is needed at this point.   Tammy, EMT

## 2013-08-24 ENCOUNTER — Ambulatory Visit (INDEPENDENT_AMBULATORY_CARE_PROVIDER_SITE_OTHER): Payer: Medicare Other

## 2013-08-24 DIAGNOSIS — J309 Allergic rhinitis, unspecified: Secondary | ICD-10-CM | POA: Diagnosis not present

## 2013-08-31 ENCOUNTER — Ambulatory Visit: Payer: Medicare Other

## 2013-09-07 ENCOUNTER — Ambulatory Visit (INDEPENDENT_AMBULATORY_CARE_PROVIDER_SITE_OTHER): Payer: Medicare Other

## 2013-09-07 DIAGNOSIS — J309 Allergic rhinitis, unspecified: Secondary | ICD-10-CM | POA: Diagnosis not present

## 2013-09-13 ENCOUNTER — Encounter: Payer: Self-pay | Admitting: Cardiovascular Disease

## 2013-09-13 ENCOUNTER — Ambulatory Visit (INDEPENDENT_AMBULATORY_CARE_PROVIDER_SITE_OTHER): Payer: Medicare Other | Admitting: *Deleted

## 2013-09-13 DIAGNOSIS — I4891 Unspecified atrial fibrillation: Secondary | ICD-10-CM

## 2013-09-13 DIAGNOSIS — I480 Paroxysmal atrial fibrillation: Secondary | ICD-10-CM

## 2013-09-13 LAB — MDC_IDC_ENUM_SESS_TYPE_REMOTE
Lead Channel Sensing Intrinsic Amplitude: 8.5 mV
Lead Channel Setting Pacing Amplitude: 2.5 V
Lead Channel Setting Pacing Amplitude: 2.5 V
Lead Channel Setting Pacing Pulse Width: 0.5 ms
MDC IDC MSMT LEADCHNL RA IMPEDANCE VALUE: 616 Ohm
MDC IDC MSMT LEADCHNL RV IMPEDANCE VALUE: 653 Ohm
MDC IDC PG SERIAL: 144309
MDC IDC SET LEADCHNL RV SENSING SENSITIVITY: 2.5 mV
MDC IDC STAT BRADY RA PERCENT PACED: 59 %
MDC IDC STAT BRADY RV PERCENT PACED: 9 %
Zone Setting Detection Interval: 375 ms

## 2013-09-14 ENCOUNTER — Ambulatory Visit: Payer: Medicare Other

## 2013-09-17 DIAGNOSIS — R7989 Other specified abnormal findings of blood chemistry: Secondary | ICD-10-CM | POA: Diagnosis not present

## 2013-09-17 DIAGNOSIS — E782 Mixed hyperlipidemia: Secondary | ICD-10-CM | POA: Diagnosis not present

## 2013-09-21 ENCOUNTER — Encounter: Payer: Self-pay | Admitting: Cardiology

## 2013-09-24 DIAGNOSIS — M159 Polyosteoarthritis, unspecified: Secondary | ICD-10-CM | POA: Diagnosis not present

## 2013-09-24 DIAGNOSIS — I10 Essential (primary) hypertension: Secondary | ICD-10-CM | POA: Diagnosis not present

## 2013-09-24 DIAGNOSIS — E782 Mixed hyperlipidemia: Secondary | ICD-10-CM | POA: Diagnosis not present

## 2013-09-24 DIAGNOSIS — I4891 Unspecified atrial fibrillation: Secondary | ICD-10-CM | POA: Diagnosis not present

## 2013-09-28 DIAGNOSIS — D485 Neoplasm of uncertain behavior of skin: Secondary | ICD-10-CM | POA: Diagnosis not present

## 2013-09-28 DIAGNOSIS — C44320 Squamous cell carcinoma of skin of unspecified parts of face: Secondary | ICD-10-CM | POA: Diagnosis not present

## 2013-09-29 ENCOUNTER — Other Ambulatory Visit: Payer: Self-pay | Admitting: Cardiovascular Disease

## 2013-09-30 ENCOUNTER — Telehealth: Payer: Self-pay | Admitting: Radiology

## 2013-09-30 NOTE — Telephone Encounter (Signed)
CT on 06/14/13 reviewed by Dr Aletta Edouard.    Next follow up for late Winter/early Spring 2016.  Patient informed and is in agreement.  Morgane Joerger Riki Rusk, RN 09/30/2013 1:38 PM

## 2013-10-05 ENCOUNTER — Ambulatory Visit (INDEPENDENT_AMBULATORY_CARE_PROVIDER_SITE_OTHER): Payer: Medicare Other

## 2013-10-05 DIAGNOSIS — J309 Allergic rhinitis, unspecified: Secondary | ICD-10-CM

## 2013-10-12 ENCOUNTER — Ambulatory Visit (INDEPENDENT_AMBULATORY_CARE_PROVIDER_SITE_OTHER): Payer: Medicare Other

## 2013-10-12 DIAGNOSIS — J309 Allergic rhinitis, unspecified: Secondary | ICD-10-CM

## 2013-10-15 DIAGNOSIS — M1710 Unilateral primary osteoarthritis, unspecified knee: Secondary | ICD-10-CM | POA: Diagnosis not present

## 2013-10-19 ENCOUNTER — Ambulatory Visit: Payer: Medicare Other

## 2013-10-26 DIAGNOSIS — C44320 Squamous cell carcinoma of skin of unspecified parts of face: Secondary | ICD-10-CM | POA: Diagnosis not present

## 2013-11-24 ENCOUNTER — Emergency Department (HOSPITAL_COMMUNITY): Payer: Medicare Other

## 2013-11-24 ENCOUNTER — Emergency Department (HOSPITAL_COMMUNITY)
Admission: EM | Admit: 2013-11-24 | Discharge: 2013-11-24 | Disposition: A | Discharge status: Home / Self Care | Payer: Medicare Other | Attending: Emergency Medicine | Admitting: Emergency Medicine

## 2013-11-24 ENCOUNTER — Encounter (HOSPITAL_COMMUNITY): Payer: Self-pay | Admitting: Emergency Medicine

## 2013-11-24 DIAGNOSIS — Z87828 Personal history of other (healed) physical injury and trauma: Secondary | ICD-10-CM | POA: Insufficient documentation

## 2013-11-24 DIAGNOSIS — Z85840 Personal history of malignant neoplasm of eye: Secondary | ICD-10-CM | POA: Diagnosis not present

## 2013-11-24 DIAGNOSIS — J4489 Other specified chronic obstructive pulmonary disease: Secondary | ICD-10-CM | POA: Insufficient documentation

## 2013-11-24 DIAGNOSIS — R011 Cardiac murmur, unspecified: Secondary | ICD-10-CM | POA: Insufficient documentation

## 2013-11-24 DIAGNOSIS — E785 Hyperlipidemia, unspecified: Secondary | ICD-10-CM | POA: Insufficient documentation

## 2013-11-24 DIAGNOSIS — Z8669 Personal history of other diseases of the nervous system and sense organs: Secondary | ICD-10-CM | POA: Diagnosis not present

## 2013-11-24 DIAGNOSIS — Z8709 Personal history of other diseases of the respiratory system: Secondary | ICD-10-CM | POA: Insufficient documentation

## 2013-11-24 DIAGNOSIS — Z87891 Personal history of nicotine dependence: Secondary | ICD-10-CM | POA: Insufficient documentation

## 2013-11-24 DIAGNOSIS — K219 Gastro-esophageal reflux disease without esophagitis: Secondary | ICD-10-CM | POA: Diagnosis not present

## 2013-11-24 DIAGNOSIS — M79609 Pain in unspecified limb: Secondary | ICD-10-CM | POA: Diagnosis not present

## 2013-11-24 DIAGNOSIS — Z950 Presence of cardiac pacemaker: Secondary | ICD-10-CM | POA: Insufficient documentation

## 2013-11-24 DIAGNOSIS — Z860100 Personal history of colon polyps, unspecified: Secondary | ICD-10-CM | POA: Insufficient documentation

## 2013-11-24 DIAGNOSIS — I10 Essential (primary) hypertension: Secondary | ICD-10-CM | POA: Insufficient documentation

## 2013-11-24 DIAGNOSIS — M25519 Pain in unspecified shoulder: Secondary | ICD-10-CM | POA: Diagnosis not present

## 2013-11-24 DIAGNOSIS — J9819 Other pulmonary collapse: Secondary | ICD-10-CM | POA: Diagnosis not present

## 2013-11-24 DIAGNOSIS — I4891 Unspecified atrial fibrillation: Secondary | ICD-10-CM | POA: Diagnosis not present

## 2013-11-24 DIAGNOSIS — R6889 Other general symptoms and signs: Secondary | ICD-10-CM | POA: Diagnosis not present

## 2013-11-24 DIAGNOSIS — J449 Chronic obstructive pulmonary disease, unspecified: Secondary | ICD-10-CM | POA: Insufficient documentation

## 2013-11-24 DIAGNOSIS — Z79899 Other long term (current) drug therapy: Secondary | ICD-10-CM | POA: Insufficient documentation

## 2013-11-24 DIAGNOSIS — M791 Myalgia, unspecified site: Secondary | ICD-10-CM

## 2013-11-24 DIAGNOSIS — IMO0001 Reserved for inherently not codable concepts without codable children: Secondary | ICD-10-CM | POA: Diagnosis not present

## 2013-11-24 DIAGNOSIS — M79602 Pain in left arm: Secondary | ICD-10-CM

## 2013-11-24 DIAGNOSIS — R918 Other nonspecific abnormal finding of lung field: Secondary | ICD-10-CM | POA: Diagnosis not present

## 2013-11-24 LAB — I-STAT TROPONIN, ED
Troponin i, poc: 0.01 ng/mL (ref 0.00–0.08)
Troponin i, poc: 0.01 ng/mL (ref 0.00–0.08)

## 2013-11-24 LAB — CBC
HCT: 43.4 % (ref 36.0–46.0)
Hemoglobin: 14.5 g/dL (ref 12.0–15.0)
MCH: 32.7 pg (ref 26.0–34.0)
MCHC: 33.4 g/dL (ref 30.0–36.0)
MCV: 97.7 fL (ref 78.0–100.0)
Platelets: 144 10*3/uL — ABNORMAL LOW (ref 150–400)
RBC: 4.44 MIL/uL (ref 3.87–5.11)
RDW: 12.8 % (ref 11.5–15.5)
WBC: 7.2 10*3/uL (ref 4.0–10.5)

## 2013-11-24 LAB — BASIC METABOLIC PANEL
ANION GAP: 15 (ref 5–15)
BUN: 19 mg/dL (ref 6–23)
CALCIUM: 8.9 mg/dL (ref 8.4–10.5)
CHLORIDE: 104 meq/L (ref 96–112)
CO2: 23 meq/L (ref 19–32)
CREATININE: 0.99 mg/dL (ref 0.50–1.10)
GFR calc Af Amer: 58 mL/min — ABNORMAL LOW (ref >90)
GFR calc non Af Amer: 50 mL/min — ABNORMAL LOW (ref >90)
GLUCOSE: 115 mg/dL — AB (ref 70–99)
POTASSIUM: 3.6 meq/L — AB (ref 3.7–5.3)
Sodium: 142 mEq/L (ref 137–147)

## 2013-11-24 LAB — PRO B NATRIURETIC PEPTIDE: Pro B Natriuretic peptide (BNP): 819.5 pg/mL — ABNORMAL HIGH (ref 0–450)

## 2013-11-24 NOTE — ED Notes (Signed)
Pt states that about 11am she started having left arm pain that radiated into her shoulder and back. Pain 2/10. Pt denies chest pain today- took ibuprofen for pain without relief. A few days ago pt had jaw pain with chest pain. BP today 180/100 with EMS. Pt is currently pain free. Hx. PPM, Afib, COPD

## 2013-11-24 NOTE — ED Notes (Signed)
Vital signs stable. 

## 2013-11-24 NOTE — Discharge Instructions (Signed)
Call Dr. Lorenza Cambridge for follow up appointment. Return to the emergency room with any recurrence of chest and jaw pain.  Chest Wall Pain Chest wall pain is pain in or around the bones and muscles of your chest. It may take up to 6 weeks to get better. It may take longer if you must stay physically active in your work and activities.  CAUSES  Chest wall pain may happen on its own. However, it may be caused by:  A viral illness like the flu.  Injury.  Coughing.  Exercise.  Arthritis.  Fibromyalgia.  Shingles. HOME CARE INSTRUCTIONS   Avoid overtiring physical activity. Try not to strain or perform activities that cause pain. This includes any activities using your chest or your abdominal and side muscles, especially if heavy weights are used.  Put ice on the sore area.  Put ice in a plastic bag.  Place a towel between your skin and the bag.  Leave the ice on for 15-20 minutes per hour while awake for the first 2 days.  Only take over-the-counter or prescription medicines for pain, discomfort, or fever as directed by your caregiver. SEEK IMMEDIATE MEDICAL CARE IF:   Your pain increases, or you are very uncomfortable.  You have a fever.  Your chest pain becomes worse.  You have new, unexplained symptoms.  You have nausea or vomiting.  You feel sweaty or lightheaded.  You have a cough with phlegm (sputum), or you cough up blood. MAKE SURE YOU:   Understand these instructions.  Will watch your condition.  Will get help right away if you are not doing well or get worse. Document Released: 04/29/2005 Document Revised: 07/22/2011 Document Reviewed: 12/24/2010 [facility] Patient Information 2015 [place], [place]. This information is not intended to replace advice given to you by your health care provider. Make sure you discuss any questions you have with your health care provider.  Muscle Pain Muscle pain (myalgia) may be caused by many things, including:  Overuse or  muscle strain, especially if you are not in shape. This is the most common cause of muscle pain.  Injury.  Bruises.  Viruses, such as the flu.  Infectious diseases.  Fibromyalgia, which is a chronic condition that causes muscle tenderness, fatigue, and headache.  Autoimmune diseases, including lupus.  Certain drugs, including ACE inhibitors and statins. Muscle pain may be mild or severe. In most cases, the pain lasts only a short time and goes away without treatment. To diagnose the cause of your muscle pain, your health care provider will take your medical history. This means he or she will ask you when your muscle pain began and what has been happening. If you have not had muscle pain for very long, your health care provider may want to wait before doing much testing. If your muscle pain has lasted a long time, your health care provider may want to run tests right away. If your health care provider thinks your muscle pain may be caused by illness, you may need to have additional tests to rule out certain conditions.  Treatment for muscle pain depends on the cause. Home care is often enough to relieve muscle pain. Your health care provider may also prescribe anti-inflammatory medicine. HOME CARE INSTRUCTIONS Watch your condition for any changes. The following actions may help to lessen any discomfort you are feeling:  Only take over-the-counter or prescription medicines as directed by your health care provider.  Apply ice to the sore muscle:  Put ice in a plastic  bag.  Place a towel between your skin and the bag.  Leave the ice on for 15-20 minutes, 3-4 times a day.  You may alternate applying hot and cold packs to the muscle as directed by your health care provider.  If overuse is causing your muscle pain, slow down your activities until the pain goes away.  Remember that it is normal to feel some muscle pain after starting a workout program. Muscles that have not been used often  will be sore at first.  Do regular, gentle exercises if you are not usually active.  Warm up before exercising to lower your risk of muscle pain.  Do not continue working out if the pain is very bad. Bad pain could mean you have injured a muscle. SEEK MEDICAL CARE IF:  Your muscle pain gets worse, and medicines do not help.  You have muscle pain that lasts longer than 3 days.  You have a rash or fever along with muscle pain.  You have muscle pain after a tick bite.  You have muscle pain while working out, even though you are in good physical condition.  You have redness, soreness, or swelling along with muscle pain.  You have muscle pain after starting a new medicine or changing the dose of a medicine. SEEK IMMEDIATE MEDICAL CARE IF:  You have trouble breathing.  You have trouble swallowing.  You have muscle pain along with a stiff neck, fever, and vomiting.  You have severe muscle weakness or cannot move part of your body. MAKE SURE YOU:   Understand these instructions.  Will watch your condition.  Will get help right away if you are not doing well or get worse. Document Released: 03/21/2006 Document Revised: 05/04/2013 Document Reviewed: 02/23/2013 [facility] Patient Information 2015 [place], [place]. This information is not intended to replace advice given to you by your health care provider. Make sure you discuss any questions you have with your health care provider.
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
## 2013-11-30 ENCOUNTER — Ambulatory Visit (INDEPENDENT_AMBULATORY_CARE_PROVIDER_SITE_OTHER): Payer: Medicare Other

## 2013-11-30 DIAGNOSIS — J309 Allergic rhinitis, unspecified: Secondary | ICD-10-CM

## 2013-12-07 ENCOUNTER — Ambulatory Visit (INDEPENDENT_AMBULATORY_CARE_PROVIDER_SITE_OTHER): Payer: Medicare Other

## 2013-12-07 DIAGNOSIS — J309 Allergic rhinitis, unspecified: Secondary | ICD-10-CM

## 2013-12-11 ENCOUNTER — Other Ambulatory Visit: Payer: Self-pay | Admitting: Cardiovascular Disease

## 2013-12-13 NOTE — Telephone Encounter (Signed)
Rx refill sent to patient pharmacy   

## 2013-12-14 ENCOUNTER — Ambulatory Visit (INDEPENDENT_AMBULATORY_CARE_PROVIDER_SITE_OTHER): Payer: Medicare Other

## 2013-12-14 DIAGNOSIS — J309 Allergic rhinitis, unspecified: Secondary | ICD-10-CM | POA: Diagnosis not present

## 2013-12-15 ENCOUNTER — Ambulatory Visit (INDEPENDENT_AMBULATORY_CARE_PROVIDER_SITE_OTHER): Payer: Medicare Other | Admitting: *Deleted

## 2013-12-15 DIAGNOSIS — I4891 Unspecified atrial fibrillation: Secondary | ICD-10-CM

## 2013-12-15 DIAGNOSIS — I480 Paroxysmal atrial fibrillation: Secondary | ICD-10-CM

## 2013-12-15 LAB — MDC_IDC_ENUM_SESS_TYPE_REMOTE
Brady Statistic RA Percent Paced: 56 %
Brady Statistic RV Percent Paced: 9 %
Implantable Pulse Generator Serial Number: 144309
Lead Channel Sensing Intrinsic Amplitude: 7.3 mV
Lead Channel Setting Pacing Amplitude: 2.5 V
Lead Channel Setting Pacing Amplitude: 2.5 V
Lead Channel Setting Pacing Pulse Width: 0.5 ms
Lead Channel Setting Sensing Sensitivity: 2.5 mV
MDC IDC MSMT LEADCHNL RA IMPEDANCE VALUE: 610 Ohm
MDC IDC MSMT LEADCHNL RA SENSING INTR AMPL: 11.5 mV
MDC IDC MSMT LEADCHNL RV IMPEDANCE VALUE: 709 Ohm
Zone Setting Detection Interval: 375 ms

## 2013-12-15 NOTE — Progress Notes (Signed)
Remote pacemaker transmission.   

## 2013-12-21 ENCOUNTER — Ambulatory Visit (INDEPENDENT_AMBULATORY_CARE_PROVIDER_SITE_OTHER): Payer: Medicare Other

## 2013-12-21 DIAGNOSIS — J309 Allergic rhinitis, unspecified: Secondary | ICD-10-CM | POA: Diagnosis not present

## 2013-12-22 ENCOUNTER — Encounter: Payer: Self-pay | Admitting: Internal Medicine

## 2013-12-28 ENCOUNTER — Ambulatory Visit (INDEPENDENT_AMBULATORY_CARE_PROVIDER_SITE_OTHER): Payer: Medicare Other

## 2013-12-28 DIAGNOSIS — J309 Allergic rhinitis, unspecified: Secondary | ICD-10-CM | POA: Diagnosis not present

## 2014-01-04 ENCOUNTER — Ambulatory Visit (INDEPENDENT_AMBULATORY_CARE_PROVIDER_SITE_OTHER): Payer: Medicare Other

## 2014-01-04 ENCOUNTER — Encounter: Payer: Self-pay | Admitting: Cardiology

## 2014-01-04 DIAGNOSIS — C6930 Malignant neoplasm of unspecified choroid: Secondary | ICD-10-CM | POA: Diagnosis not present

## 2014-01-04 DIAGNOSIS — J309 Allergic rhinitis, unspecified: Secondary | ICD-10-CM

## 2014-01-04 DIAGNOSIS — Z961 Presence of intraocular lens: Secondary | ICD-10-CM | POA: Diagnosis not present

## 2014-01-04 DIAGNOSIS — H52209 Unspecified astigmatism, unspecified eye: Secondary | ICD-10-CM | POA: Diagnosis not present

## 2014-01-04 DIAGNOSIS — H3530 Unspecified macular degeneration: Secondary | ICD-10-CM | POA: Diagnosis not present

## 2014-01-09 ENCOUNTER — Other Ambulatory Visit: Payer: Self-pay | Admitting: Cardiovascular Disease

## 2014-01-10 ENCOUNTER — Encounter: Payer: Self-pay | Admitting: Cardiovascular Disease

## 2014-01-10 NOTE — Telephone Encounter (Signed)
Rx was sent to pharmacy electronically. 

## 2014-01-11 ENCOUNTER — Ambulatory Visit (INDEPENDENT_AMBULATORY_CARE_PROVIDER_SITE_OTHER): Payer: Medicare Other

## 2014-01-11 DIAGNOSIS — J309 Allergic rhinitis, unspecified: Secondary | ICD-10-CM | POA: Diagnosis not present

## 2014-01-18 ENCOUNTER — Other Ambulatory Visit: Payer: Self-pay | Admitting: Cardiovascular Disease

## 2014-01-18 ENCOUNTER — Ambulatory Visit (INDEPENDENT_AMBULATORY_CARE_PROVIDER_SITE_OTHER): Payer: Medicare Other

## 2014-01-18 DIAGNOSIS — J309 Allergic rhinitis, unspecified: Secondary | ICD-10-CM | POA: Diagnosis not present

## 2014-01-18 NOTE — Telephone Encounter (Signed)
Rx was sent to pharmacy electronically. 

## 2014-01-19 DIAGNOSIS — M159 Polyosteoarthritis, unspecified: Secondary | ICD-10-CM | POA: Diagnosis not present

## 2014-01-19 DIAGNOSIS — M25569 Pain in unspecified knee: Secondary | ICD-10-CM | POA: Diagnosis not present

## 2014-01-19 DIAGNOSIS — I10 Essential (primary) hypertension: Secondary | ICD-10-CM | POA: Diagnosis not present

## 2014-01-19 DIAGNOSIS — I4891 Unspecified atrial fibrillation: Secondary | ICD-10-CM | POA: Diagnosis not present

## 2014-01-23 DIAGNOSIS — I499 Cardiac arrhythmia, unspecified: Secondary | ICD-10-CM | POA: Diagnosis not present

## 2014-01-23 DIAGNOSIS — I4891 Unspecified atrial fibrillation: Secondary | ICD-10-CM | POA: Diagnosis not present

## 2014-01-25 ENCOUNTER — Ambulatory Visit: Payer: Medicare Other

## 2014-01-27 DIAGNOSIS — R5381 Other malaise: Secondary | ICD-10-CM | POA: Diagnosis not present

## 2014-01-27 DIAGNOSIS — J40 Bronchitis, not specified as acute or chronic: Secondary | ICD-10-CM | POA: Diagnosis not present

## 2014-01-27 DIAGNOSIS — R5383 Other fatigue: Secondary | ICD-10-CM | POA: Diagnosis not present

## 2014-01-27 DIAGNOSIS — J069 Acute upper respiratory infection, unspecified: Secondary | ICD-10-CM | POA: Diagnosis not present

## 2014-01-27 DIAGNOSIS — R059 Cough, unspecified: Secondary | ICD-10-CM | POA: Diagnosis not present

## 2014-01-27 DIAGNOSIS — R05 Cough: Secondary | ICD-10-CM | POA: Diagnosis not present

## 2014-01-29 ENCOUNTER — Other Ambulatory Visit: Payer: Self-pay | Admitting: Cardiovascular Disease

## 2014-01-31 NOTE — Telephone Encounter (Signed)
Rx was sent to pharmacy electronically. 

## 2014-02-08 ENCOUNTER — Encounter: Payer: Self-pay | Admitting: Cardiovascular Disease

## 2014-02-08 ENCOUNTER — Ambulatory Visit (INDEPENDENT_AMBULATORY_CARE_PROVIDER_SITE_OTHER): Payer: Medicare Other

## 2014-02-08 DIAGNOSIS — J309 Allergic rhinitis, unspecified: Secondary | ICD-10-CM

## 2014-02-08 DIAGNOSIS — Z23 Encounter for immunization: Secondary | ICD-10-CM

## 2014-02-09 ENCOUNTER — Telehealth: Payer: Self-pay | Admitting: *Deleted

## 2014-02-09 DIAGNOSIS — M1710 Unilateral primary osteoarthritis, unspecified knee: Secondary | ICD-10-CM | POA: Diagnosis not present

## 2014-02-09 NOTE — Telephone Encounter (Signed)
Notified that a surgical clearance letter has been sent to her surgeon.  Husband voiced understanding.

## 2014-02-09 NOTE — Telephone Encounter (Signed)
Message copied by Tressa Busman on Wed Feb 09, 2014  3:44 PM ------      Message from: Sanda Klein      Created: Tue Feb 08, 2014  6:14 PM       Letter of surgical clearance for total knee replacement is ready in Epic ------

## 2014-02-14 ENCOUNTER — Other Ambulatory Visit: Payer: Self-pay | Admitting: Cardiovascular Disease

## 2014-02-14 NOTE — Telephone Encounter (Signed)
Rx was sent to pharmacy electronically. 

## 2014-02-15 ENCOUNTER — Ambulatory Visit (INDEPENDENT_AMBULATORY_CARE_PROVIDER_SITE_OTHER): Payer: Medicare Other

## 2014-02-15 DIAGNOSIS — J309 Allergic rhinitis, unspecified: Secondary | ICD-10-CM

## 2014-02-16 ENCOUNTER — Encounter: Payer: Self-pay | Admitting: Internal Medicine

## 2014-02-16 ENCOUNTER — Ambulatory Visit: Payer: Medicare Other | Admitting: Internal Medicine

## 2014-02-16 VITALS — BP 100/78 | HR 71 | Ht 62.0 in | Wt 161.4 lb

## 2014-02-16 DIAGNOSIS — M170 Bilateral primary osteoarthritis of knee: Secondary | ICD-10-CM | POA: Diagnosis not present

## 2014-02-16 DIAGNOSIS — J301 Allergic rhinitis due to pollen: Secondary | ICD-10-CM

## 2014-02-16 NOTE — Progress Notes (Signed)
Patient ID: [name], female    DOB: 1926/05/09, 80 y.o.   MRN: [id number]  HPI 12/28/10- 80 year old female former smoker followed for allergic rhinitis eustachian dysfunction complicated by history of atrial fibrillation/amiodarone. Last here June 29, 2010  Disliked the heat this summer but otherwise denies respiratory problems since last here.  Allergy vaccine- ok, getting shots here. Still uses ipratropium nasal spray for rhinorhea when going out, but doesn't bother with it if she is staying home. CXR 06/29/10- clear, NAD, with emphysema/ COPD change, No indication of amiodarone related changes. Got cortisone shot in knee for gout 4 days ago.  07/02/11-  80 year old female former smoker followed for allergic rhinitis, eustachian dysfunction complicated by history of atrial fibrillation/amiodarone. Stable on allergy vaccine. No change in her sense of shortness of breath with exertion which has been  present for years. Stopped amiodarone in October of 2012. Cardioversion was unsuccessful. Some coughing spells without wheezing. Denies anemia or chest pain. Paces herself. Asks refill for ipratropium nasal spray. Nasal stuffiness contributes to her sense of shortness of breath. PFT 12/15/2007-FEV1 1.91/114% without response to bronchodilator, FEV1/FVC 0.71, TLC 104%, DLCO 60%. CXR-  IMPRESSION: 05/29/11 Mild cardiomegaly without acute disease.  Original Report Authenticated By: Arvid Right. D'ALESSIO, M.D.   02/16/13- 80 year old female former smoker followed for allergic rhinitis, eustachian dysfunction complicated by history of atrial fibrillation/amiodarone FOLLOWS FOR: still on Allergy vaccine 1:10 GH and doing well; recently had virus-on abx BID from PCP. Has had flu vaccine. Primary physician treated bronchitis with antibiotic and gave nasal spray and cough syrup. She has not been needing ipratropium nasal spray.  02/16/14- 80 year old female former smoker followed for allergic  rhinitis, eustachian dysfunction complicated by history of atrial fibrillation/amiodarone FOLLOW UP: Allergies; DOE; SOB; Pt has Afib, she is going in and out of Afib this morning, she says this is normal for her, it is nothing new; when her heart starts beating real fast, she gets short of breath; no further complaints. On Pradaxa  CXR 11/24/13 IMPRESSION:  No significant acute findings.  Electronically Signed  By: Skipper Cliche M.D.  On: 11/24/2013 17:03   Review of Systems- see HPI Constitutional:   No-   weight loss, night sweats, fevers, chills, fatigue, lassitude. HEENT:   No-  headaches, difficulty swallowing, tooth/dental problems, sore throat,       No-  sneezing, itching, ear ache, +nasal congestion, post nasal drip,  CV:  No-   chest pain, orthopnea, PND, swelling in lower extremities, anasarca, dizziness, palpitations Resp:+  shortness of breath with exertion or at rest.              No-   productive cough,  + non-productive cough,  No-  coughing up of blood.              No-   change in color of mucus.  No- wheezing.   Skin: No-   rash or lesions. GI:  No-   heartburn, indigestion, abdominal pain, nausea, vomiting,  GU: MS:  + recent  joint pain or swelling. Neuro- grossly normal to observation, Psych:  No- change in mood or affect. No depression or anxiety.  No memory loss.   Objective:   Physical Exam General- Alert, Oriented, Affect-appropriate, Distress- none acute Skin- rash-none, lesions- none, excoriation- none Lymphadenopathy- none Head- atraumatic            Eyes- Gross vision intact, PERRLA, conjunctivae clear secretions  Ears- Hearing, canals normal            Nose- Clear, No-Septal dev, mucus, polyps, erosion, perforation             Throat- Mallampati II , mucosa clear , drainage- none, tonsils- atrophic. + face mask Neck- flexible , trachea midline, no stridor , thyroid nl, carotid no bruit Chest - symmetrical excursion , unlabored            Heart/CV- IRR with occasional extra beat , 7-5/9 systolic murmur AS , no gallop  , no rub, nl s1 s2                           - JVD- none , edema- none, stasis changes- none, varices- none           Lung- clear to P&A, wheeze- none, light cough , dullness-none, rub- none           Chest wall-  Abd-  Br/ Gen/ Rectal- Not done, not indicated Extrem- cyanosis- none, clubbing, none, atrophy- none, strength- nl Neuro- grossly intact to observation

## 2014-02-16 NOTE — Patient Instructions (Signed)
We talked about options with your allergy vaccine.(1) We can continue as we are doing, (2)We can change the interval and give your shots every other week , or (3)We can stop your shots and watch to see how you do.   You can ask Dr Ashby Dawes whether he wants you to have the Prevnar-13 pneumonia vaccine.

## 2014-02-18 ENCOUNTER — Ambulatory Visit (INDEPENDENT_AMBULATORY_CARE_PROVIDER_SITE_OTHER): Payer: Medicare Other

## 2014-02-18 DIAGNOSIS — J309 Allergic rhinitis, unspecified: Secondary | ICD-10-CM

## 2014-02-22 ENCOUNTER — Ambulatory Visit (INDEPENDENT_AMBULATORY_CARE_PROVIDER_SITE_OTHER): Payer: Medicare Other

## 2014-02-22 DIAGNOSIS — J309 Allergic rhinitis, unspecified: Secondary | ICD-10-CM

## 2014-02-23 DIAGNOSIS — M170 Bilateral primary osteoarthritis of knee: Secondary | ICD-10-CM | POA: Diagnosis not present

## 2014-03-02 DIAGNOSIS — L570 Actinic keratosis: Secondary | ICD-10-CM | POA: Diagnosis not present

## 2014-03-02 DIAGNOSIS — Z08 Encounter for follow-up examination after completed treatment for malignant neoplasm: Secondary | ICD-10-CM | POA: Diagnosis not present

## 2014-03-02 DIAGNOSIS — Z85828 Personal history of other malignant neoplasm of skin: Secondary | ICD-10-CM | POA: Diagnosis not present

## 2014-03-08 ENCOUNTER — Ambulatory Visit (INDEPENDENT_AMBULATORY_CARE_PROVIDER_SITE_OTHER): Payer: Medicare Other

## 2014-03-08 DIAGNOSIS — J309 Allergic rhinitis, unspecified: Secondary | ICD-10-CM

## 2014-03-17 ENCOUNTER — Encounter: Payer: Self-pay | Admitting: Cardiovascular Disease

## 2014-03-17 ENCOUNTER — Telehealth: Payer: Self-pay | Admitting: Cardiology

## 2014-03-17 ENCOUNTER — Ambulatory Visit (INDEPENDENT_AMBULATORY_CARE_PROVIDER_SITE_OTHER): Payer: Medicare Other | Admitting: *Deleted

## 2014-03-17 DIAGNOSIS — I480 Paroxysmal atrial fibrillation: Secondary | ICD-10-CM | POA: Diagnosis not present

## 2014-03-17 NOTE — Progress Notes (Signed)
Remote pacemaker transmission.   

## 2014-03-17 NOTE — Telephone Encounter (Signed)
LMOVM reminding pt to send remote transmission.   

## 2014-03-18 DIAGNOSIS — I10 Essential (primary) hypertension: Secondary | ICD-10-CM | POA: Diagnosis not present

## 2014-03-19 LAB — MDC_IDC_ENUM_SESS_TYPE_REMOTE
Brady Statistic RV Percent Paced: 11 %
Lead Channel Impedance Value: 593 Ohm
Lead Channel Impedance Value: 697 Ohm
Lead Channel Setting Pacing Amplitude: 2.5 V
Lead Channel Setting Pacing Pulse Width: 0.5 ms
MDC IDC MSMT BATTERY REMAINING LONGEVITY: 66 mo
MDC IDC MSMT LEADCHNL RA SENSING INTR AMPL: 8.1 mV
MDC IDC MSMT LEADCHNL RV SENSING INTR AMPL: 7.8 mV
MDC IDC PG SERIAL: 144309
MDC IDC SET LEADCHNL RA PACING AMPLITUDE: 2.5 V
MDC IDC SET LEADCHNL RV SENSING SENSITIVITY: 2.5 mV
MDC IDC STAT BRADY RA PERCENT PACED: 50 %
Zone Setting Detection Interval: 375 ms

## 2014-03-22 ENCOUNTER — Ambulatory Visit: Payer: Medicare Other

## 2014-03-24 ENCOUNTER — Encounter: Payer: Self-pay | Admitting: Internal Medicine

## 2014-03-25 DIAGNOSIS — I480 Paroxysmal atrial fibrillation: Secondary | ICD-10-CM | POA: Diagnosis not present

## 2014-03-25 DIAGNOSIS — I10 Essential (primary) hypertension: Secondary | ICD-10-CM | POA: Diagnosis not present

## 2014-03-25 DIAGNOSIS — E782 Mixed hyperlipidemia: Secondary | ICD-10-CM | POA: Diagnosis not present

## 2014-03-29 ENCOUNTER — Ambulatory Visit (INDEPENDENT_AMBULATORY_CARE_PROVIDER_SITE_OTHER): Payer: Medicare Other

## 2014-03-29 DIAGNOSIS — J309 Allergic rhinitis, unspecified: Secondary | ICD-10-CM

## 2014-04-01 ENCOUNTER — Encounter: Payer: Self-pay | Admitting: Cardiology

## 2014-04-05 ENCOUNTER — Ambulatory Visit: Payer: Medicare Other

## 2014-04-11 ENCOUNTER — Other Ambulatory Visit: Payer: Self-pay | Admitting: Cardiovascular Disease

## 2014-04-19 ENCOUNTER — Ambulatory Visit (INDEPENDENT_AMBULATORY_CARE_PROVIDER_SITE_OTHER): Payer: Medicare Other

## 2014-04-19 DIAGNOSIS — J309 Allergic rhinitis, unspecified: Secondary | ICD-10-CM | POA: Diagnosis not present

## 2014-04-21 ENCOUNTER — Encounter (HOSPITAL_COMMUNITY): Payer: Self-pay | Admitting: Cardiovascular Disease

## 2014-04-26 ENCOUNTER — Ambulatory Visit: Payer: Medicare Other

## 2014-04-28 ENCOUNTER — Telehealth: Payer: Self-pay | Admitting: Cardiovascular Disease

## 2014-04-28 NOTE — Telephone Encounter (Signed)
Spoke to patient   she states for about 3-4 day chest discomfort She states she develop afib today.she is short of breathe talking with RN. Patient has pacemaker, blood pressure 7am  92/54  Pulse 98 :10:45 am 142/75 pulse 121; 11:55 am 148/82 pulse 122. Asked patient to do a download of pacer. She states she will. RN will defer to Dr Sallyanne Kuster.

## 2014-04-28 NOTE — Telephone Encounter (Signed)
Device interrogation shows in and out of paroxysmal atrial fibrillation with relatively mild rapid ventricular rate. Suspect her symptoms will subside as the additional metoprolol kicks in.

## 2014-04-28 NOTE — Telephone Encounter (Signed)
Spoke to patient RN spoke to patient. Informed patient of Dr Sallyanne Kuster comments She states has been laying down,she did take The extra dose of metoprolol,she states no difference, Last blood pressure is 115/73 pulse 119.  RN informed patient will discsuss with Dr Sallyanne Kuster.

## 2014-04-28 NOTE — Telephone Encounter (Signed)
Spoke to patient Informed patient Dr Sallyanne Kuster, states she made need to continue to rest heart should convert,if still not feeling good Go to ER. Patient states she will wait and if needed she will go to ER.

## 2014-04-28 NOTE — Telephone Encounter (Signed)
[name] is feeling some tightness in her chest and she is in AFib , shortness of breath States that it feels like she is sore on the inside of chest when she breathe and heart rate is 122 , and blood pressure is good . Please call    Thanks

## 2014-04-28 NOTE — Telephone Encounter (Signed)
Spoke to Dr [employer].and [phone] Per Dr Sallyanne Kuster, take extra Metoprolol succ. 50 mg now and go and rest for about an hour if still short of breathe and or afib go to emergency room  RN spoke to patient. RN spoke to patient and instruction given. Patient verbalized understanding.

## 2014-05-03 ENCOUNTER — Ambulatory Visit (INDEPENDENT_AMBULATORY_CARE_PROVIDER_SITE_OTHER): Payer: Medicare Other

## 2014-05-03 DIAGNOSIS — J309 Allergic rhinitis, unspecified: Secondary | ICD-10-CM

## 2014-05-09 ENCOUNTER — Other Ambulatory Visit: Payer: Self-pay | Admitting: Cardiovascular Disease

## 2014-05-17 ENCOUNTER — Ambulatory Visit (INDEPENDENT_AMBULATORY_CARE_PROVIDER_SITE_OTHER): Payer: Medicare Other

## 2014-05-17 DIAGNOSIS — J309 Allergic rhinitis, unspecified: Secondary | ICD-10-CM | POA: Diagnosis not present

## 2014-05-18 ENCOUNTER — Encounter: Payer: Self-pay | Admitting: Internal Medicine

## 2014-05-26 ENCOUNTER — Encounter: Payer: Self-pay | Admitting: Cardiovascular Disease

## 2014-05-30 ENCOUNTER — Encounter: Payer: Self-pay | Admitting: Cardiovascular Disease

## 2014-05-31 ENCOUNTER — Ambulatory Visit (INDEPENDENT_AMBULATORY_CARE_PROVIDER_SITE_OTHER): Payer: Medicare Other

## 2014-05-31 DIAGNOSIS — J309 Allergic rhinitis, unspecified: Secondary | ICD-10-CM | POA: Diagnosis not present

## 2014-06-08 ENCOUNTER — Other Ambulatory Visit (HOSPITAL_COMMUNITY): Payer: Self-pay | Admitting: Interventional Radiology

## 2014-06-08 DIAGNOSIS — N2889 Other specified disorders of kidney and ureter: Secondary | ICD-10-CM

## 2014-06-14 ENCOUNTER — Ambulatory Visit (INDEPENDENT_AMBULATORY_CARE_PROVIDER_SITE_OTHER): Payer: Medicare Other

## 2014-06-14 DIAGNOSIS — J309 Allergic rhinitis, unspecified: Secondary | ICD-10-CM

## 2014-06-16 ENCOUNTER — Other Ambulatory Visit (HOSPITAL_COMMUNITY): Payer: Self-pay | Admitting: Cardiovascular Disease

## 2014-06-16 DIAGNOSIS — I701 Atherosclerosis of renal artery: Secondary | ICD-10-CM

## 2014-06-17 DIAGNOSIS — H3530 Unspecified macular degeneration: Secondary | ICD-10-CM | POA: Diagnosis not present

## 2014-06-17 DIAGNOSIS — C6931 Malignant neoplasm of right choroid: Secondary | ICD-10-CM | POA: Diagnosis not present

## 2014-06-17 DIAGNOSIS — H43813 Vitreous degeneration, bilateral: Secondary | ICD-10-CM | POA: Diagnosis not present

## 2014-06-17 DIAGNOSIS — H40003 Preglaucoma, unspecified, bilateral: Secondary | ICD-10-CM | POA: Diagnosis not present

## 2014-06-21 ENCOUNTER — Ambulatory Visit: Payer: Medicare Other

## 2014-06-21 ENCOUNTER — Encounter: Payer: Self-pay | Admitting: Internal Medicine

## 2014-06-21 ENCOUNTER — Ambulatory Visit (INDEPENDENT_AMBULATORY_CARE_PROVIDER_SITE_OTHER): Payer: Medicare Other | Admitting: Cardiovascular Disease

## 2014-06-21 ENCOUNTER — Encounter: Payer: Self-pay | Admitting: Cardiovascular Disease

## 2014-06-21 VITALS — BP 140/70 | HR 71 | Resp 16 | Ht 62.0 in | Wt 158.8 lb

## 2014-06-21 DIAGNOSIS — E785 Hyperlipidemia, unspecified: Secondary | ICD-10-CM | POA: Diagnosis not present

## 2014-06-21 DIAGNOSIS — I701 Atherosclerosis of renal artery: Secondary | ICD-10-CM | POA: Diagnosis not present

## 2014-06-21 DIAGNOSIS — I480 Paroxysmal atrial fibrillation: Secondary | ICD-10-CM | POA: Diagnosis not present

## 2014-06-21 DIAGNOSIS — I10 Essential (primary) hypertension: Secondary | ICD-10-CM

## 2014-06-21 DIAGNOSIS — I452 Bifascicular block: Secondary | ICD-10-CM

## 2014-06-21 DIAGNOSIS — R079 Chest pain, unspecified: Secondary | ICD-10-CM | POA: Diagnosis not present

## 2014-06-21 LAB — MDC_IDC_ENUM_SESS_TYPE_INCLINIC
Battery Remaining Longevity: 7
Brady Statistic RV Percent Paced: 12 %
Date Time Interrogation Session: 20160209050000
Lead Channel Impedance Value: 566 Ohm
Lead Channel Impedance Value: 679 Ohm
Lead Channel Pacing Threshold Pulse Width: 0.5 ms
Lead Channel Sensing Intrinsic Amplitude: 10.9 mV
Lead Channel Sensing Intrinsic Amplitude: 7.7 mV
Lead Channel Setting Pacing Amplitude: 2.4 V
Lead Channel Setting Pacing Pulse Width: 0.5 ms
Lead Channel Setting Sensing Sensitivity: 2.5 mV
MDC IDC MSMT LEADCHNL RA PACING THRESHOLD AMPLITUDE: 0.5 V
MDC IDC MSMT LEADCHNL RA PACING THRESHOLD PULSEWIDTH: 0.5 ms
MDC IDC MSMT LEADCHNL RV PACING THRESHOLD AMPLITUDE: 1.2 V
MDC IDC PG SERIAL: 144309
MDC IDC SET LEADCHNL RA PACING AMPLITUDE: 2 V
MDC IDC STAT BRADY RA PERCENT PACED: 47 %
Zone Setting Detection Interval: 375 ms

## 2014-06-21 MED ORDER — FUROSEMIDE 40 MG PO TABS: 40.0000 mg | ORAL_TABLET | ORAL | Status: DC
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
## 2014-06-21 NOTE — Patient Instructions (Addendum)
Remote monitoring is used to monitor your pacemaker from home. This monitoring reduces the number of office visits required to check your device to one time per year. It allows [place] to keep an eye on the functioning of your device to ensure it is working properly. You are scheduled for a device check from home on 09/20/2014. You may send your transmission at any time that day. If you have a wireless device, the transmission will be sent automatically. After your physician reviews your transmission, you will receive a postcard with your next transmission date.  Your physician recommends that you schedule a follow-up appointment in: 6 months with Dr.Croitoru  Your physician has recommended you make the following change in your medication: TAKE LASIX (FUROSEMIDE) [place] Saturday, DO NOT TAKE ANY ON Sunday  Your physician has requested that you have an echocardiogram. Echocardiography is a painless test that uses sound waves to create images of your heart. It provides your doctor with information about the size and shape of your heart and how well your heart's chambers and valves are working. This procedure takes approximately one hour. There are no restrictions for this procedure.

## 2014-06-22 ENCOUNTER — Telehealth: Payer: Self-pay | Admitting: *Deleted

## 2014-06-22 DIAGNOSIS — E785 Hyperlipidemia, unspecified: Secondary | ICD-10-CM

## 2014-06-22 DIAGNOSIS — Z79899 Other long term (current) drug therapy: Secondary | ICD-10-CM

## 2014-06-22 MED ORDER — FUROSEMIDE 40 MG PO TABS: 20.0000 mg | ORAL_TABLET | ORAL | Status: DC

## 2014-06-22 NOTE — Telephone Encounter (Signed)
Lab order mailed with note telling her to have this done FASTING at her convenience.

## 2014-06-22 NOTE — Addendum Note (Signed)
Addended by: Shiela Mayer on: 06/22/2014 11:11 AM   Modules accepted: Orders, Medications

## 2014-06-22 NOTE — Telephone Encounter (Signed)
-----   Message from Sanda Klein, MD sent at 06/21/2014  6:25 PM EST ----- At her convenience needs to have a CMET and lipid profile

## 2014-06-23 DIAGNOSIS — D3131 Benign neoplasm of right choroid: Secondary | ICD-10-CM | POA: Diagnosis not present

## 2014-06-28 ENCOUNTER — Ambulatory Visit (INDEPENDENT_AMBULATORY_CARE_PROVIDER_SITE_OTHER): Payer: Medicare Other

## 2014-06-28 ENCOUNTER — Other Ambulatory Visit: Payer: Self-pay | Admitting: *Deleted

## 2014-06-28 DIAGNOSIS — J309 Allergic rhinitis, unspecified: Secondary | ICD-10-CM

## 2014-06-28 MED ORDER — DOFETILIDE 250 MCG PO CAPS: ORAL_CAPSULE | ORAL | Status: DC

## 2014-06-28 NOTE — Telephone Encounter (Signed)
Rx(s) sent to pharmacy electronically.  

## 2014-06-29 ENCOUNTER — Telehealth: Payer: Self-pay | Admitting: Cardiovascular Disease

## 2014-06-29 NOTE — Telephone Encounter (Signed)
Pt called in stating that Dr. Loletha Grayer wanted her to have some labs done before coming in for her test on 2/26. He wanted her to have a lipid panel and comprehensive metabolic panel done. She states that she had these same labs done at Dr. Mathis Fare office back in November and wanted to know if she needed to have them done again. Please f/u  Thanks

## 2014-06-29 NOTE — Telephone Encounter (Signed)
Please advise 

## 2014-06-29 NOTE — Telephone Encounter (Signed)
No, I just need to get a copy from Dr. Ashby Dawes. Can you please forward this request to medical records?

## 2014-06-29 NOTE — Telephone Encounter (Signed)
Attempted to call pt back, no answer w/ continuous ring   Routing to medical records per Dr. Lurline Del request.

## 2014-06-30 ENCOUNTER — Encounter: Payer: Self-pay | Admitting: Cardiovascular Disease

## 2014-07-01 ENCOUNTER — Telehealth: Payer: Self-pay | Admitting: Cardiovascular Disease

## 2014-07-01 NOTE — Telephone Encounter (Signed)
[name] is calling to find out if she should have lab work done . Please call .Marland Kitchen

## 2014-07-01 NOTE — Telephone Encounter (Signed)
Spoke w/ patient. Refer to previous encounter. Explained that we just need to get labwork from Dr. Mathis Fare office, I had sent a request to Medical Records to get this.

## 2014-07-06 ENCOUNTER — Encounter (HOSPITAL_COMMUNITY): Payer: Medicare Other

## 2014-07-08 ENCOUNTER — Ambulatory Visit (HOSPITAL_BASED_OUTPATIENT_CLINIC_OR_DEPARTMENT_OTHER)
Admission: RE | Admit: 2014-07-08 | Discharge: 2014-07-08 | Disposition: A | Discharge status: Home / Self Care | Payer: Medicare Other | Source: Ambulatory Visit | Attending: Cardiology | Admitting: Cardiology

## 2014-07-08 ENCOUNTER — Ambulatory Visit (HOSPITAL_COMMUNITY)
Admission: RE | Admit: 2014-07-08 | Discharge: 2014-07-08 | Disposition: A | Discharge status: Home / Self Care | Payer: Medicare Other | Source: Ambulatory Visit | Attending: Cardiology | Admitting: Cardiology

## 2014-07-08 DIAGNOSIS — Z87891 Personal history of nicotine dependence: Secondary | ICD-10-CM | POA: Diagnosis not present

## 2014-07-08 DIAGNOSIS — I10 Essential (primary) hypertension: Secondary | ICD-10-CM | POA: Diagnosis not present

## 2014-07-08 DIAGNOSIS — I701 Atherosclerosis of renal artery: Secondary | ICD-10-CM | POA: Diagnosis not present

## 2014-07-08 DIAGNOSIS — Z8249 Family history of ischemic heart disease and other diseases of the circulatory system: Secondary | ICD-10-CM | POA: Insufficient documentation

## 2014-07-08 DIAGNOSIS — R079 Chest pain, unspecified: Secondary | ICD-10-CM | POA: Insufficient documentation

## 2014-07-08 DIAGNOSIS — K219 Gastro-esophageal reflux disease without esophagitis: Secondary | ICD-10-CM | POA: Insufficient documentation

## 2014-07-08 DIAGNOSIS — E785 Hyperlipidemia, unspecified: Secondary | ICD-10-CM | POA: Insufficient documentation

## 2014-07-08 DIAGNOSIS — I4891 Unspecified atrial fibrillation: Secondary | ICD-10-CM | POA: Diagnosis not present

## 2014-07-08 DIAGNOSIS — I452 Bifascicular block: Secondary | ICD-10-CM | POA: Diagnosis not present

## 2014-07-08 DIAGNOSIS — J449 Chronic obstructive pulmonary disease, unspecified: Secondary | ICD-10-CM | POA: Insufficient documentation

## 2014-07-08 NOTE — Progress Notes (Signed)
Renal Duplex Completed. Climmie Cronce, BS, RDMS, RVT  

## 2014-07-08 NOTE — Progress Notes (Signed)
2D Echocardiogram Complete.  07/08/2014   [name] [place], [place]

## 2014-07-09 ENCOUNTER — Other Ambulatory Visit: Payer: Self-pay | Admitting: Cardiovascular Disease

## 2014-07-11 NOTE — Telephone Encounter (Signed)
Rx(s) sent to pharmacy electronically.  

## 2014-07-12 ENCOUNTER — Ambulatory Visit (INDEPENDENT_AMBULATORY_CARE_PROVIDER_SITE_OTHER): Payer: Medicare Other

## 2014-07-12 DIAGNOSIS — J309 Allergic rhinitis, unspecified: Secondary | ICD-10-CM

## 2014-07-14 DIAGNOSIS — I10 Essential (primary) hypertension: Secondary | ICD-10-CM | POA: Diagnosis not present

## 2014-07-14 DIAGNOSIS — Z0000 Encounter for general adult medical examination without abnormal findings: Secondary | ICD-10-CM | POA: Diagnosis not present

## 2014-07-14 DIAGNOSIS — E782 Mixed hyperlipidemia: Secondary | ICD-10-CM | POA: Diagnosis not present

## 2014-07-14 DIAGNOSIS — Z1389 Encounter for screening for other disorder: Secondary | ICD-10-CM | POA: Diagnosis not present

## 2014-07-14 DIAGNOSIS — Z780 Asymptomatic menopausal state: Secondary | ICD-10-CM | POA: Diagnosis not present

## 2014-07-17 ENCOUNTER — Encounter (HOSPITAL_COMMUNITY): Payer: Self-pay | Admitting: *Deleted

## 2014-07-17 ENCOUNTER — Emergency Department (HOSPITAL_COMMUNITY)
Admission: EM | Admit: 2014-07-17 | Discharge: 2014-07-17 | Disposition: A | Discharge status: Home / Self Care | Payer: Medicare Other | Attending: Emergency Medicine | Admitting: Emergency Medicine

## 2014-07-17 ENCOUNTER — Emergency Department (HOSPITAL_COMMUNITY): Payer: Medicare Other

## 2014-07-17 DIAGNOSIS — Z859 Personal history of malignant neoplasm, unspecified: Secondary | ICD-10-CM | POA: Insufficient documentation

## 2014-07-17 DIAGNOSIS — Z8739 Personal history of other diseases of the musculoskeletal system and connective tissue: Secondary | ICD-10-CM | POA: Diagnosis not present

## 2014-07-17 DIAGNOSIS — Z87891 Personal history of nicotine dependence: Secondary | ICD-10-CM | POA: Diagnosis not present

## 2014-07-17 DIAGNOSIS — K219 Gastro-esophageal reflux disease without esophagitis: Secondary | ICD-10-CM | POA: Diagnosis not present

## 2014-07-17 DIAGNOSIS — Z79899 Other long term (current) drug therapy: Secondary | ICD-10-CM | POA: Insufficient documentation

## 2014-07-17 DIAGNOSIS — Z950 Presence of cardiac pacemaker: Secondary | ICD-10-CM | POA: Insufficient documentation

## 2014-07-17 DIAGNOSIS — I480 Paroxysmal atrial fibrillation: Secondary | ICD-10-CM | POA: Diagnosis not present

## 2014-07-17 DIAGNOSIS — Z87828 Personal history of other (healed) physical injury and trauma: Secondary | ICD-10-CM | POA: Insufficient documentation

## 2014-07-17 DIAGNOSIS — Z8669 Personal history of other diseases of the nervous system and sense organs: Secondary | ICD-10-CM | POA: Insufficient documentation

## 2014-07-17 DIAGNOSIS — J449 Chronic obstructive pulmonary disease, unspecified: Secondary | ICD-10-CM | POA: Diagnosis not present

## 2014-07-17 DIAGNOSIS — J441 Chronic obstructive pulmonary disease with (acute) exacerbation: Secondary | ICD-10-CM | POA: Insufficient documentation

## 2014-07-17 DIAGNOSIS — Z86010 Personal history of colonic polyps: Secondary | ICD-10-CM | POA: Diagnosis not present

## 2014-07-17 DIAGNOSIS — R531 Weakness: Secondary | ICD-10-CM

## 2014-07-17 DIAGNOSIS — R011 Cardiac murmur, unspecified: Secondary | ICD-10-CM | POA: Insufficient documentation

## 2014-07-17 DIAGNOSIS — E785 Hyperlipidemia, unspecified: Secondary | ICD-10-CM | POA: Diagnosis not present

## 2014-07-17 DIAGNOSIS — I10 Essential (primary) hypertension: Secondary | ICD-10-CM | POA: Diagnosis not present

## 2014-07-17 LAB — CBC WITH DIFFERENTIAL/PLATELET
Basophils Absolute: 0 10*3/uL (ref 0.0–0.1)
Basophils Relative: 0 % (ref 0–1)
EOS ABS: 0 10*3/uL (ref 0.0–0.7)
Eosinophils Relative: 0 % (ref 0–5)
HEMATOCRIT: 45.6 % (ref 36.0–46.0)
Hemoglobin: 15.5 g/dL — ABNORMAL HIGH (ref 12.0–15.0)
LYMPHS PCT: 22 % (ref 12–46)
Lymphs Abs: 2 10*3/uL (ref 0.7–4.0)
MCH: 31.9 pg (ref 26.0–34.0)
MCHC: 34 g/dL (ref 30.0–36.0)
MCV: 93.8 fL (ref 78.0–100.0)
MONO ABS: 1 10*3/uL (ref 0.1–1.0)
Monocytes Relative: 12 % (ref 3–12)
NEUTROS PCT: 66 % (ref 43–77)
Neutro Abs: 5.8 10*3/uL (ref 1.7–7.7)
Platelets: 173 10*3/uL (ref 150–400)
RBC: 4.86 MIL/uL (ref 3.87–5.11)
RDW: 13.3 % (ref 11.5–15.5)
WBC: 8.9 10*3/uL (ref 4.0–10.5)

## 2014-07-17 LAB — I-STAT TROPONIN, ED
TROPONIN I, POC: 0.01 ng/mL (ref 0.00–0.08)
Troponin i, poc: 0.01 ng/mL (ref 0.00–0.08)

## 2014-07-17 LAB — BASIC METABOLIC PANEL
Anion gap: 7 (ref 5–15)
BUN: 14 mg/dL (ref 6–23)
CALCIUM: 9.1 mg/dL (ref 8.4–10.5)
CO2: 24 mmol/L (ref 19–32)
Chloride: 105 mmol/L (ref 96–112)
Creatinine, Ser: 0.95 mg/dL (ref 0.50–1.10)
GFR, EST AFRICAN AMERICAN: 61 mL/min — AB (ref >90)
GFR, EST NON AFRICAN AMERICAN: 53 mL/min — AB (ref >90)
Glucose, Bld: 92 mg/dL (ref 70–99)
POTASSIUM: 4 mmol/L (ref 3.5–5.1)
Sodium: 136 mmol/L (ref 135–145)

## 2014-07-17 LAB — URINALYSIS, ROUTINE W REFLEX MICROSCOPIC
BILIRUBIN URINE: NEGATIVE
Glucose, UA: NEGATIVE mg/dL
Hgb urine dipstick: NEGATIVE
Ketones, ur: NEGATIVE mg/dL
Leukocytes, UA: NEGATIVE
Nitrite: NEGATIVE
PROTEIN: NEGATIVE mg/dL
Specific Gravity, Urine: 1.005 (ref 1.005–1.030)
UROBILINOGEN UA: 0.2 mg/dL (ref 0.0–1.0)
pH: 7 (ref 5.0–8.0)

## 2014-07-17 MED ORDER — DILTIAZEM HCL 25 MG/5ML IV SOLN: 10.0000 mg | Freq: Once | INTRAVENOUS | Status: DC

## 2014-07-17 NOTE — ED Notes (Signed)
Pt placed on monitor upon return to room from restroom. Pt remains monitored by blood pressure, pulse ox, and 12 lead. Pt was able to ambulate to the restroom with stand by assistance.

## 2014-07-17 NOTE — ED Notes (Signed)
Pt reports waking up this am fatigue and weakness, reports hypotension at home. Pt has right side chest pain that radiates through to her back.
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
## 2014-07-17 NOTE — ED Notes (Signed)
Pt remains monitored by blood pressure, pulse ox, and 12 lead. pts family remains at bedside.  

## 2014-07-17 NOTE — Discharge Instructions (Signed)
Follow up with your doctor for further evaluation. Return to the ED with worsening or concerning symptoms. Refer to attached documents for more information.

## 2014-07-19 ENCOUNTER — Telehealth: Payer: Self-pay | Admitting: Cardiovascular Disease

## 2014-07-19 NOTE — Telephone Encounter (Signed)
Pt voiced understanding & agreement with plan.

## 2014-07-19 NOTE — Telephone Encounter (Signed)
HHRN reports pt has had weakness, dizziness typically in AM. Regularly low BP readings (58-72 systolic) w/ increased HR (unspecified).  RN stated that this seems to only happen in the morning and patient is not symptomatic at other times of day.  This has been ongoing for a while now. Apparently related, she was evaluated in ED 2 days ago w/ symp of CP, SOB, fatigue. She was cleared for home & instr to f/u w PCP.  HHRN concerned for medication dosing.   Routing to Dr. Sallyanne Kuster to advise.

## 2014-07-19 NOTE — Telephone Encounter (Signed)
Decrease AM dose of Toprol XL in half. Leave on same PM dose

## 2014-07-19 NOTE — Telephone Encounter (Signed)
[name] called in wanting to speak to Dr. Lurline Del nurse about the directions on the pt's Metoprolol ER medication. He has her taking the medication bid which is in result causing AM low BP along with weakness and dizziness(which she was admitted to the ER for on Sunday). Please call back the nurse to f/u with further details.   Thanks

## 2014-07-21 DIAGNOSIS — I480 Paroxysmal atrial fibrillation: Secondary | ICD-10-CM | POA: Diagnosis not present

## 2014-07-21 DIAGNOSIS — Z23 Encounter for immunization: Secondary | ICD-10-CM | POA: Diagnosis not present

## 2014-07-21 DIAGNOSIS — E782 Mixed hyperlipidemia: Secondary | ICD-10-CM | POA: Diagnosis not present

## 2014-07-21 DIAGNOSIS — I10 Essential (primary) hypertension: Secondary | ICD-10-CM | POA: Diagnosis not present

## 2014-07-21 DIAGNOSIS — M150 Primary generalized (osteo)arthritis: Secondary | ICD-10-CM | POA: Diagnosis not present

## 2014-07-25 ENCOUNTER — Telehealth: Payer: Self-pay | Admitting: *Deleted

## 2014-07-26 ENCOUNTER — Ambulatory Visit (INDEPENDENT_AMBULATORY_CARE_PROVIDER_SITE_OTHER): Payer: Medicare Other

## 2014-07-26 ENCOUNTER — Telehealth: Payer: Self-pay | Admitting: Cardiovascular Disease

## 2014-07-26 DIAGNOSIS — J309 Allergic rhinitis, unspecified: Secondary | ICD-10-CM

## 2014-07-26 NOTE — Telephone Encounter (Signed)
Reduce metoprolol (be it succinate or tartrate) to 25 mg in AM and 50 mg in PM please

## 2014-07-26 NOTE — Telephone Encounter (Signed)
RN spoke to patient  patient states she takes 50 mg in morning and 25 mg in the evening now  Patient is aware now to reverse dosage take 25 mg morning and 50 mg evening Also try to take lasix (furosemide) separately from Metoprolol. She verbalized understanding.  RN left message and spoke to [employer] as well.

## 2014-07-26 NOTE — Telephone Encounter (Signed)
Spoke to [employer]- from [place] [facility] She states she was visiting patient and her husband today. Marlowe Kays states she was calling  because patient is still having issues with feeling weak and dizzy several times a.week. Marlowe Kays states patient states she has issues in the mornings   Marlowe Kays reviewing patient's medication  -states she is taking Metoprolol succ twice a day and not once day. Marlowe Kays was concerned that patient may have been on metoprolol tartrate at some point and changed. Marlowe Kays was wondering if if patient needed Metoprolol Succinate. Would like to make Dr Sallyanne Kuster aware.  RN informed Marlowe Kays will defer to Dr Sallyanne Kuster and Pamala Hurry, [place]. She would like follow up and to contact patient.

## 2014-08-04 NOTE — Telephone Encounter (Signed)
Error

## 2014-08-08 ENCOUNTER — Other Ambulatory Visit: Payer: Self-pay

## 2014-08-08 MED ORDER — POTASSIUM CHLORIDE CRYS ER 20 MEQ PO TBCR: 20.0000 meq | EXTENDED_RELEASE_TABLET | Freq: Every day | ORAL | Status: DC

## 2014-08-08 MED ORDER — SUCRALFATE 1 G PO TABS: 1.0000 g | ORAL_TABLET | Freq: Two times a day (BID) | ORAL | Status: DC

## 2014-08-08 NOTE — Telephone Encounter (Signed)
Rx(s) sent to pharmacy electronically.  

## 2014-08-09 ENCOUNTER — Ambulatory Visit (INDEPENDENT_AMBULATORY_CARE_PROVIDER_SITE_OTHER): Payer: Medicare Other

## 2014-08-09 DIAGNOSIS — J309 Allergic rhinitis, unspecified: Secondary | ICD-10-CM | POA: Diagnosis not present

## 2014-08-23 ENCOUNTER — Ambulatory Visit (INDEPENDENT_AMBULATORY_CARE_PROVIDER_SITE_OTHER): Payer: Medicare Other

## 2014-08-23 ENCOUNTER — Ambulatory Visit: Payer: Medicare Other

## 2014-08-23 DIAGNOSIS — J309 Allergic rhinitis, unspecified: Secondary | ICD-10-CM

## 2014-09-03 ENCOUNTER — Other Ambulatory Visit: Payer: Self-pay | Admitting: Cardiovascular Disease

## 2014-09-06 ENCOUNTER — Ambulatory Visit (INDEPENDENT_AMBULATORY_CARE_PROVIDER_SITE_OTHER): Payer: Medicare Other

## 2014-09-06 DIAGNOSIS — J309 Allergic rhinitis, unspecified: Secondary | ICD-10-CM | POA: Diagnosis not present

## 2014-09-09 ENCOUNTER — Telehealth: Payer: Self-pay | Admitting: Cardiovascular Disease

## 2014-09-09 NOTE — Telephone Encounter (Signed)
Received call from patient she stated her heart has been out of rhythm for the past 24 hours.Stated this is the longest it has ever been out of rhythm.Stated she feels awful,sob,no energy.B/P 117/79 pulse 105,93/67 pulse 106,95/71 pulse 90,127/64 pulse 102.Spoke to Dr.Croitoru he advised to take a extra 1/2 tablet of metoprolol.Advised to lay down in a dark quiet room.Advised after 2 to 3 hours if heart still out of rhythm go to ER.

## 2014-09-14 ENCOUNTER — Inpatient Hospital Stay (HOSPITAL_COMMUNITY)
Admission: EM | Admit: 2014-09-14 | Discharge: 2014-09-16 | DRG: 536 | Payer: Medicare Other | Attending: Internal Medicine | Admitting: Internal Medicine

## 2014-09-14 ENCOUNTER — Emergency Department (HOSPITAL_COMMUNITY): Payer: Medicare Other

## 2014-09-14 ENCOUNTER — Encounter (HOSPITAL_COMMUNITY): Payer: Self-pay | Admitting: *Deleted

## 2014-09-14 DIAGNOSIS — M25552 Pain in left hip: Secondary | ICD-10-CM | POA: Diagnosis not present

## 2014-09-14 DIAGNOSIS — I4891 Unspecified atrial fibrillation: Secondary | ICD-10-CM | POA: Diagnosis not present

## 2014-09-14 DIAGNOSIS — S0083XA Contusion of other part of head, initial encounter: Secondary | ICD-10-CM | POA: Diagnosis present

## 2014-09-14 DIAGNOSIS — J449 Chronic obstructive pulmonary disease, unspecified: Secondary | ICD-10-CM | POA: Diagnosis present

## 2014-09-14 DIAGNOSIS — Z86010 Personal history of colonic polyps: Secondary | ICD-10-CM | POA: Diagnosis not present

## 2014-09-14 DIAGNOSIS — S329XXA Fracture of unspecified parts of lumbosacral spine and pelvis, initial encounter for closed fracture: Secondary | ICD-10-CM | POA: Diagnosis present

## 2014-09-14 DIAGNOSIS — S32810A Multiple fractures of pelvis with stable disruption of pelvic ring, initial encounter for closed fracture: Secondary | ICD-10-CM | POA: Diagnosis not present

## 2014-09-14 DIAGNOSIS — S32592A Other specified fracture of left pubis, initial encounter for closed fracture: Secondary | ICD-10-CM | POA: Diagnosis not present

## 2014-09-14 DIAGNOSIS — S3681XD Injury of peritoneum, subsequent encounter: Secondary | ICD-10-CM | POA: Diagnosis not present

## 2014-09-14 DIAGNOSIS — S098XXA Other specified injuries of head, initial encounter: Secondary | ICD-10-CM | POA: Diagnosis not present

## 2014-09-14 DIAGNOSIS — Z950 Presence of cardiac pacemaker: Secondary | ICD-10-CM | POA: Diagnosis not present

## 2014-09-14 DIAGNOSIS — S32592D Other specified fracture of left pubis, subsequent encounter for fracture with routine healing: Secondary | ICD-10-CM | POA: Diagnosis not present

## 2014-09-14 DIAGNOSIS — K5900 Constipation, unspecified: Secondary | ICD-10-CM | POA: Diagnosis present

## 2014-09-14 DIAGNOSIS — M1990 Unspecified osteoarthritis, unspecified site: Secondary | ICD-10-CM | POA: Diagnosis present

## 2014-09-14 DIAGNOSIS — I10 Essential (primary) hypertension: Secondary | ICD-10-CM | POA: Diagnosis not present

## 2014-09-14 DIAGNOSIS — E785 Hyperlipidemia, unspecified: Secondary | ICD-10-CM | POA: Diagnosis present

## 2014-09-14 DIAGNOSIS — S32502A Unspecified fracture of left pubis, initial encounter for closed fracture: Secondary | ICD-10-CM | POA: Diagnosis not present

## 2014-09-14 DIAGNOSIS — K219 Gastro-esophageal reflux disease without esophagitis: Secondary | ICD-10-CM | POA: Diagnosis present

## 2014-09-14 DIAGNOSIS — W010XXA Fall on same level from slipping, tripping and stumbling without subsequent striking against object, initial encounter: Secondary | ICD-10-CM | POA: Diagnosis present

## 2014-09-14 DIAGNOSIS — S299XXA Unspecified injury of thorax, initial encounter: Secondary | ICD-10-CM | POA: Diagnosis not present

## 2014-09-14 DIAGNOSIS — T148 Other injury of unspecified body region: Secondary | ICD-10-CM | POA: Diagnosis not present

## 2014-09-14 DIAGNOSIS — I480 Paroxysmal atrial fibrillation: Secondary | ICD-10-CM | POA: Diagnosis present

## 2014-09-14 DIAGNOSIS — S0000XA Unspecified superficial injury of scalp, initial encounter: Secondary | ICD-10-CM | POA: Diagnosis not present

## 2014-09-14 DIAGNOSIS — S32512S Fracture of superior rim of left pubis, sequela: Secondary | ICD-10-CM | POA: Diagnosis not present

## 2014-09-14 DIAGNOSIS — Z888 Allergy status to other drugs, medicaments and biological substances status: Secondary | ICD-10-CM | POA: Diagnosis not present

## 2014-09-14 DIAGNOSIS — Z85528 Personal history of other malignant neoplasm of kidney: Secondary | ICD-10-CM

## 2014-09-14 DIAGNOSIS — S32512A Fracture of superior rim of left pubis, initial encounter for closed fracture: Secondary | ICD-10-CM | POA: Diagnosis not present

## 2014-09-14 DIAGNOSIS — Y92009 Unspecified place in unspecified non-institutional (private) residence as the place of occurrence of the external cause: Secondary | ICD-10-CM

## 2014-09-14 DIAGNOSIS — S199XXA Unspecified injury of neck, initial encounter: Secondary | ICD-10-CM | POA: Diagnosis not present

## 2014-09-14 DIAGNOSIS — Z87891 Personal history of nicotine dependence: Secondary | ICD-10-CM | POA: Diagnosis not present

## 2014-09-14 DIAGNOSIS — I481 Persistent atrial fibrillation: Secondary | ICD-10-CM | POA: Diagnosis present

## 2014-09-14 DIAGNOSIS — T1490XA Injury, unspecified, initial encounter: Secondary | ICD-10-CM

## 2014-09-14 DIAGNOSIS — R0789 Other chest pain: Secondary | ICD-10-CM | POA: Diagnosis not present

## 2014-09-14 DIAGNOSIS — Z85820 Personal history of malignant melanoma of skin: Secondary | ICD-10-CM | POA: Diagnosis not present

## 2014-09-14 DIAGNOSIS — E875 Hyperkalemia: Secondary | ICD-10-CM | POA: Diagnosis not present

## 2014-09-14 DIAGNOSIS — R220 Localized swelling, mass and lump, head: Secondary | ICD-10-CM | POA: Diagnosis not present

## 2014-09-14 DIAGNOSIS — Z85840 Personal history of malignant neoplasm of eye: Secondary | ICD-10-CM

## 2014-09-14 DIAGNOSIS — Z9049 Acquired absence of other specified parts of digestive tract: Secondary | ICD-10-CM | POA: Diagnosis present

## 2014-09-14 DIAGNOSIS — S0083XS Contusion of other part of head, sequela: Secondary | ICD-10-CM | POA: Diagnosis not present

## 2014-09-14 DIAGNOSIS — I4811 Longstanding persistent atrial fibrillation: Secondary | ICD-10-CM | POA: Diagnosis present

## 2014-09-14 DIAGNOSIS — W19XXXA Unspecified fall, initial encounter: Secondary | ICD-10-CM

## 2014-09-14 DIAGNOSIS — E7800 Pure hypercholesterolemia, unspecified: Secondary | ICD-10-CM | POA: Diagnosis present

## 2014-09-14 LAB — URINALYSIS, ROUTINE W REFLEX MICROSCOPIC
Bilirubin Urine: NEGATIVE
Glucose, UA: NEGATIVE mg/dL
HGB URINE DIPSTICK: NEGATIVE
KETONES UR: NEGATIVE mg/dL
Leukocytes, UA: NEGATIVE
Nitrite: NEGATIVE
PH: 5 (ref 5.0–8.0)
Protein, ur: NEGATIVE mg/dL
SPECIFIC GRAVITY, URINE: 1.01 (ref 1.005–1.030)
UROBILINOGEN UA: 0.2 mg/dL (ref 0.0–1.0)

## 2014-09-14 LAB — BASIC METABOLIC PANEL
Anion gap: 11 (ref 5–15)
BUN: 13 mg/dL (ref 6–20)
CHLORIDE: 108 mmol/L (ref 101–111)
CO2: 20 mmol/L — AB (ref 22–32)
Calcium: 8.8 mg/dL — ABNORMAL LOW (ref 8.9–10.3)
Creatinine, Ser: 0.87 mg/dL (ref 0.44–1.00)
GFR calc Af Amer: >60 mL/min (ref >60)
GFR, EST NON AFRICAN AMERICAN: 59 mL/min — AB (ref >60)
GLUCOSE: 131 mg/dL — AB (ref 70–99)
POTASSIUM: 3.9 mmol/L (ref 3.5–5.1)
SODIUM: 139 mmol/L (ref 135–145)

## 2014-09-14 LAB — PROTIME-INR
INR: 1.23 (ref 0.00–1.49)
Prothrombin Time: 15.7 seconds — ABNORMAL HIGH (ref 11.6–15.2)

## 2014-09-14 LAB — CBC WITH DIFFERENTIAL/PLATELET
BASOS ABS: 0 10*3/uL (ref 0.0–0.1)
Basophils Relative: 0 % (ref 0–1)
Eosinophils Absolute: 0 10*3/uL (ref 0.0–0.7)
Eosinophils Relative: 0 % (ref 0–5)
HCT: 43.8 % (ref 36.0–46.0)
HEMOGLOBIN: 15.1 g/dL — AB (ref 12.0–15.0)
Lymphocytes Relative: 14 % (ref 12–46)
Lymphs Abs: 1.4 10*3/uL (ref 0.7–4.0)
MCH: 32.6 pg (ref 26.0–34.0)
MCHC: 34.5 g/dL (ref 30.0–36.0)
MCV: 94.6 fL (ref 78.0–100.0)
MONO ABS: 0.8 10*3/uL (ref 0.1–1.0)
Monocytes Relative: 8 % (ref 3–12)
NEUTROS ABS: 8.4 10*3/uL — AB (ref 1.7–7.7)
NEUTROS PCT: 78 % — AB (ref 43–77)
Platelets: 140 10*3/uL — ABNORMAL LOW (ref 150–400)
RBC: 4.63 MIL/uL (ref 3.87–5.11)
RDW: 13.2 % (ref 11.5–15.5)
WBC: 10.7 10*3/uL — ABNORMAL HIGH (ref 4.0–10.5)

## 2014-09-14 LAB — TYPE AND SCREEN
ABO/RH(D): A POS
ANTIBODY SCREEN: NEGATIVE

## 2014-09-14 LAB — I-STAT CREATININE, ED: Creatinine, Ser: 1 mg/dL (ref 0.44–1.00)

## 2014-09-14 MED ORDER — MAGNESIUM OXIDE 400 (241.3 MG) MG PO TABS
400.0000 mg | ORAL_TABLET | Freq: Every day | ORAL | Status: DC
  Administered 2014-09-15 – 2014-09-16 (×2): 400 mg via ORAL
  Filled 2014-09-14 (×2): qty 1

## 2014-09-14 MED ORDER — ACETAMINOPHEN 325 MG PO TABS
650.0000 mg | ORAL_TABLET | Freq: Four times a day (QID) | ORAL | Status: DC | PRN
  Administered 2014-09-16: 650 mg via ORAL
  Filled 2014-09-14: qty 2

## 2014-09-14 MED ORDER — DABIGATRAN ETEXILATE MESYLATE 75 MG PO CAPS
75.0000 mg | ORAL_CAPSULE | Freq: Two times a day (BID) | ORAL | Status: DC
  Administered 2014-09-14 – 2014-09-16 (×4): 75 mg via ORAL
  Filled 2014-09-14 (×5): qty 1

## 2014-09-14 MED ORDER — IOHEXOL 300 MG/ML  SOLN
80.0000 mL | Freq: Once | INTRAMUSCULAR | Status: AC | PRN
  Administered 2014-09-14: 80 mL via INTRAVENOUS

## 2014-09-14 MED ORDER — SUCRALFATE 1 G PO TABS: 1.0000 g | ORAL_TABLET | Freq: Four times a day (QID) | ORAL | Status: DC

## 2014-09-14 MED ORDER — MORPHINE SULFATE 2 MG/ML IJ SOLN
2.0000 mg | INTRAMUSCULAR | Status: DC | PRN
  Administered 2014-09-14 – 2014-09-15 (×2): 2 mg via INTRAVENOUS
  Filled 2014-09-14 (×2): qty 1

## 2014-09-14 MED ORDER — ADULT MULTIVITAMIN W/MINERALS CH
1.0000 | ORAL_TABLET | ORAL | Status: DC
  Administered 2014-09-15 – 2014-09-16 (×2): 1 via ORAL
  Filled 2014-09-14 (×4): qty 1

## 2014-09-14 MED ORDER — ACETAMINOPHEN 650 MG RE SUPP: 650.0000 mg | Freq: Four times a day (QID) | RECTAL | Status: DC | PRN

## 2014-09-14 MED ORDER — ONDANSETRON HCL 4 MG/2ML IJ SOLN
4.0000 mg | Freq: Once | INTRAMUSCULAR | Status: AC
  Administered 2014-09-14: 4 mg via INTRAVENOUS
  Filled 2014-09-14: qty 2

## 2014-09-14 MED ORDER — MORPHINE SULFATE 4 MG/ML IJ SOLN
4.0000 mg | INTRAMUSCULAR | Status: DC | PRN
Start: 2014-09-14 — End: 2014-09-14
  Administered 2014-09-14: 4 mg via INTRAVENOUS
  Filled 2014-09-14: qty 1

## 2014-09-14 MED ORDER — HYDROCODONE-ACETAMINOPHEN 5-325 MG PO TABS
1.0000 | ORAL_TABLET | ORAL | Status: DC | PRN
  Administered 2014-09-15 – 2014-09-16 (×3): 2 via ORAL
  Filled 2014-09-14 (×3): qty 2

## 2014-09-14 MED ORDER — FAMOTIDINE 20 MG PO TABS
20.0000 mg | ORAL_TABLET | Freq: Two times a day (BID) | ORAL | Status: DC
  Administered 2014-09-15 – 2014-09-16 (×3): 20 mg via ORAL
  Filled 2014-09-14 (×5): qty 1

## 2014-09-14 MED ORDER — MORPHINE SULFATE 2 MG/ML IJ SOLN: 2.0000 mg | INTRAMUSCULAR | Status: DC | PRN

## 2014-09-14 MED ORDER — METOPROLOL SUCCINATE ER 50 MG PO TB24
50.0000 mg | ORAL_TABLET | Freq: Two times a day (BID) | ORAL | Status: DC
  Administered 2014-09-14 – 2014-09-16 (×4): 50 mg via ORAL
  Filled 2014-09-14 (×5): qty 1

## 2014-09-14 MED ORDER — ONDANSETRON HCL 4 MG/2ML IJ SOLN: 4.0000 mg | Freq: Four times a day (QID) | INTRAMUSCULAR | Status: DC | PRN

## 2014-09-14 MED ORDER — HYDROCODONE-ACETAMINOPHEN 5-325 MG PO TABS: 1.0000 | ORAL_TABLET | ORAL | Status: DC | PRN

## 2014-09-14 MED ORDER — ONDANSETRON HCL 4 MG/2ML IJ SOLN: 4.0000 mg | Freq: Once | INTRAMUSCULAR | Status: DC

## 2014-09-14 MED ORDER — SODIUM CHLORIDE 0.9 % IJ SOLN
3.0000 mL | Freq: Two times a day (BID) | INTRAMUSCULAR | Status: DC
  Administered 2014-09-14 – 2014-09-16 (×4): 3 mL via INTRAVENOUS

## 2014-09-14 MED ORDER — POTASSIUM CHLORIDE CRYS ER 20 MEQ PO TBCR
20.0000 meq | EXTENDED_RELEASE_TABLET | Freq: Every day | ORAL | Status: DC
  Administered 2014-09-14 – 2014-09-15 (×2): 20 meq via ORAL
  Filled 2014-09-14 (×3): qty 1

## 2014-09-14 MED ORDER — ONDANSETRON 4 MG PO TBDP: 4.0000 mg | ORAL_TABLET | Freq: Three times a day (TID) | ORAL | Status: DC | PRN

## 2014-09-14 MED ORDER — DOCUSATE SODIUM 100 MG PO CAPS
100.0000 mg | ORAL_CAPSULE | Freq: Two times a day (BID) | ORAL | Status: DC
  Administered 2014-09-14 – 2014-09-16 (×4): 100 mg via ORAL
  Filled 2014-09-14 (×6): qty 1

## 2014-09-14 MED ORDER — ROSUVASTATIN CALCIUM 5 MG PO TABS
5.0000 mg | ORAL_TABLET | Freq: Every evening | ORAL | Status: DC
  Administered 2014-09-14 – 2014-09-15 (×2): 5 mg via ORAL
  Filled 2014-09-14 (×3): qty 1

## 2014-09-14 MED ORDER — DOFETILIDE 250 MCG PO CAPS
250.0000 ug | ORAL_CAPSULE | Freq: Two times a day (BID) | ORAL | Status: DC
  Administered 2014-09-14 – 2014-09-16 (×4): 250 ug via ORAL
  Filled 2014-09-14 (×7): qty 1

## 2014-09-14 MED ORDER — ONDANSETRON HCL 4 MG PO TABS: 4.0000 mg | ORAL_TABLET | Freq: Four times a day (QID) | ORAL | Status: DC | PRN

## 2014-09-14 MED ORDER — MAGNESIUM HYDROXIDE 400 MG/5ML PO SUSP: 30.0000 mL | Freq: Every day | ORAL | Status: DC | PRN

## 2014-09-14 MED ORDER — FLUOROMETHOLONE 0.1 % OP SUSP
1.0000 [drp] | Freq: Every day | OPHTHALMIC | Status: DC
  Administered 2014-09-15 – 2014-09-16 (×2): 1 [drp] via OPHTHALMIC
  Filled 2014-09-14: qty 5

## 2014-09-14 MED ORDER — SUCRALFATE 1 G PO TABS
1.0000 g | ORAL_TABLET | Freq: Two times a day (BID) | ORAL | Status: DC
  Administered 2014-09-14 – 2014-09-16 (×4): 1 g via ORAL
  Filled 2014-09-14 (×5): qty 1

## 2014-09-14 NOTE — ED Notes (Signed)
Pt in from home via [facility], per report pt stood to answer the door & lost her balance falling onto the L hip & hitting her L forehead on a wooden floor, pt has golf ball size hematoma to L forehead, -LOC, no obvious shortening or deformity noted of L leg, +PS. Limited ROM d/t pain of L leg, pt A&O x4, denies neck & back pain

## 2014-09-14 NOTE — ED Notes (Signed)
Patient transported to CT 

## 2014-09-14 NOTE — ED Notes (Signed)
Attempted report 

## 2014-09-14 NOTE — Progress Notes (Signed)
Pt  Admitted from ED S/P fall . Oriented to room vs dome cardiac monitor in use  Will continue to monitor

## 2014-09-14 NOTE — ED Notes (Signed)
CT informed of IV #22 L forearm
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
## 2014-09-14 NOTE — ED Notes (Signed)
Admitting MD at bedside.
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
## 2014-09-14 NOTE — Discharge Instructions (Signed)
Chest pain is very likely secondary to your vomiting and an esophagitis. Pain should only improve unless you develop additional vomiting. If any point your pain is worse, or you feel more short of breath, or you have additional vomiting, recheck here. Increase Nexium to twice a day Soft mechanical diet.  Chest Pain (Nonspecific) It is often hard to give a diagnosis for the cause of chest pain. There is always a chance that your pain could be related to something serious, such as a heart attack or a blood clot in the lungs. You need to follow up with your doctor. HOME CARE  If antibiotic medicine was given, take it as directed by your doctor. Finish the medicine even if you start to feel better.  For the next few days, avoid activities that bring on chest pain. Continue physical activities as told by your doctor.  Do not use any tobacco products. This includes cigarettes, chewing tobacco, and e-cigarettes.  Avoid drinking alcohol.  Only take medicine as told by your doctor.  Follow your doctor's suggestions for more testing if your chest pain does not go away.  Keep all doctor visits you made. GET HELP IF:  Your chest pain does not go away, even after treatment.  You have a rash with blisters on your chest.  You have a fever. GET HELP RIGHT AWAY IF:   You have more pain or pain that spreads to your arm, neck, jaw, back, or belly (abdomen).  You have shortness of breath.  You cough more than usual or cough up blood.  You have very bad back or belly pain.  You feel sick to your stomach (nauseous) or throw up (vomit).  You have very bad weakness.  You pass out (faint).  You have chills. This is an emergency. Do not wait to see if the problems will go away. Call your local emergency services (911 in [place] Do not drive yourself to the hospital. MAKE SURE YOU:   Understand these instructions.  Will watch your condition.  Will get help right away if you are not doing  well or get worse. Document Released: 10/16/2007 Document Revised: 05/04/2013 Document Reviewed: 10/16/2007 [facility] Patient Information 2015 [place], [place]. This information is not intended to replace advice given to you by your health care provider. Make sure you discuss any questions you have with your health care provider.  Esophagitis Esophagitis is inflammation of the esophagus. It can involve swelling, soreness, and pain in the esophagus. This condition can make it difficult and painful to swallow. CAUSES  Most causes of esophagitis are not serious. Many different factors can cause esophagitis, including:  Gastroesophageal reflux disease (GERD). This is when acid from your stomach flows up into the esophagus.  Recurrent vomiting.  An allergic-type reaction.  Certain medicines, especially those that come in large pills.  Ingestion of harmful chemicals, such as household cleaning products.  Heavy alcohol use.  An infection of the esophagus.  Radiation treatment for cancer.  Certain diseases such as sarcoidosis, Crohn's disease, and scleroderma. These diseases may cause recurrent esophagitis. SYMPTOMS   Trouble swallowing.  Painful swallowing.  Chest pain.  Difficulty breathing.  Nausea.  Vomiting.  Abdominal pain. DIAGNOSIS  Your caregiver will take your history and do a physical exam. Depending upon what your caregiver finds, certain tests may also be done, including:  Barium X-ray. You will drink a solution that coats the esophagus, and X-rays will be taken.  Endoscopy. A lighted tube is put down the esophagus  so your caregiver can examine the area.  Allergy tests. These can sometimes be arranged through follow-up visits. TREATMENT  Treatment will depend on the cause of your esophagitis. In some cases, steroids or other medicines may be given to help relieve your symptoms or to treat the underlying cause of your condition. Medicines that may be recommended  include:  Viscous lidocaine, to soothe the esophagus.  Antacids.  Acid reducers.  Proton pump inhibitors.  Antiviral medicines for certain viral infections of the esophagus.  Antifungal medicines for certain fungal infections of the esophagus.  Antibiotic medicines, depending on the cause of the esophagitis. HOME CARE INSTRUCTIONS   Avoid foods and drinks that seem to make your symptoms worse.  Eat small, frequent meals instead of large meals.  Avoid eating for the 3 hours prior to your bedtime.  If you have trouble taking pills, use a pill splitter to decrease the size and likelihood of the pill getting stuck or injuring the esophagus on the way down. Drinking water after taking a pill also helps.  Stop smoking if you smoke.  Maintain a healthy weight.  Wear loose-fitting clothing. Do not wear anything tight around your waist that causes pressure on your stomach.  Raise the head of your bed 6 to 8 inches with wood blocks to help you sleep. Extra pillows will not help.  Only take over-the-counter or prescription medicines as directed by your caregiver. SEEK IMMEDIATE MEDICAL CARE IF:  You have severe chest pain that radiates into your arm, neck, or jaw.  You feel sweaty, dizzy, or lightheaded.  You have shortness of breath.  You vomit blood.  You have difficulty or pain with swallowing.  You have bloody or black, tarry stools.  You have a fever.  You have a burning sensation in the chest more than 3 times a week for more than 2 weeks.  You cannot swallow, drink, or eat.  You drool because you cannot swallow your saliva. MAKE SURE YOU:  Understand these instructions.  Will watch your condition.  Will get help right away if you are not doing well or get worse. Document Released: 06/06/2004 Document Revised: 07/22/2011 Document Reviewed: 12/28/2010 [facility] Patient Information 2015 [place], [place]. This information is not intended to replace advice given to  you by your health care provider. Make sure you discuss any questions you have with your health care provider.

## 2014-09-15 DIAGNOSIS — Z950 Presence of cardiac pacemaker: Secondary | ICD-10-CM

## 2014-09-15 DIAGNOSIS — I480 Paroxysmal atrial fibrillation: Secondary | ICD-10-CM

## 2014-09-15 DIAGNOSIS — S32512S Fracture of superior rim of left pubis, sequela: Secondary | ICD-10-CM

## 2014-09-15 DIAGNOSIS — S0083XS Contusion of other part of head, sequela: Secondary | ICD-10-CM

## 2014-09-15 DIAGNOSIS — I10 Essential (primary) hypertension: Secondary | ICD-10-CM

## 2014-09-15 DIAGNOSIS — R269 Unspecified abnormalities of gait and mobility: Secondary | ICD-10-CM

## 2014-09-15 LAB — URINE CULTURE

## 2014-09-15 LAB — ABO/RH: ABO/RH(D): A POS

## 2014-09-15 NOTE — Evaluation (Signed)
Occupational Therapy Evaluation Patient Details Name: [name] MRN: [id number] DOB: Feb 21, 1926 Today's Date: 09/15/2014    History of Present Illness Pt is an 81 y.o. Female with PMH COPD, PAF on anticoagulants, aneurysm, HTN, pacemaker admitted 09/14/14 after a fall at home resulting in left superior ramus fx.    Clinical Impression   PTA pt lived at home and was independent with ADLs with use of quad cane. Pt is currently limited by LLE pain and decreased ROM/strength and requires mod A +2 for mobility and assistance for LB ADLs. Pt will benefit from acute OT and is motivated to return to [place] and would be an excellent CIR candidate to progress to Supervision/Mod I level to return home with family support.     Follow Up Recommendations  CIR    Equipment Recommendations  None recommended by OT    Recommendations for Other Services Rehab consult     Precautions / Restrictions Precautions Precautions: Fall Restrictions LLE Weight Bearing: Weight bearing as tolerated      Mobility Bed Mobility Overal bed mobility: Needs Assistance;+ 2 for safety/equipment Bed Mobility: Supine to Sit;Sit to Supine     Supine to sit: Mod assist Sit to supine: Mod assist   General bed mobility comments: Mod A to manage LEs on/off bed; use of bed pad to rotate hips. Pt with good UB strength and trunk control. Limited by pain.   Transfers Overall transfer level: Needs assistance Equipment used: Rolling walker (2 wheeled) Transfers: Sit to/from Stand Sit to Stand: Mod assist;+2 physical assistance         General transfer comment: Mod A to power up from EOB and manage RW. Pt unable to take steps forward with RLE due to LLE pain. Facilitated weight shift to R side for Pt to step forward with LLE.          ADL Overall ADL's : Needs assistance/impaired Eating/Feeding: Independent;Sitting   Grooming: Set up;Sitting   Upper Body Bathing: Set up;Sitting   Lower Body Bathing:  Maximal assistance;Sit to/from stand;+2 for physical assistance   Upper Body Dressing : Set up;Sitting   Lower Body Dressing: Total assistance;+2 for physical assistance;Sit to/from stand   Toilet Transfer: Moderate assistance;+2 for physical assistance;RW Toilet Transfer Details (indicate cue type and reason): sit<>stand from EOB; unable to pivot or take steps with RLE           General ADL Comments: Pt was able to tolerate OOB but was unable to progress to ambulation due to LLE pain and inability to step with RLE.      Vision Additional Comments: No change from baseline          Pertinent Vitals/Pain Pain Assessment: 0-10 Pain Score: 9  Pain Location: L hip joint after mobility Pain Descriptors / Indicators: Aching;Grimacing;Sharp ("grabbing") Pain Intervention(s): Limited activity within patient's tolerance;Monitored during session;Repositioned;Patient requesting pain meds-RN notified        Extremity/Trunk Assessment Upper Extremity Assessment Upper Extremity Assessment: Overall WFL for tasks assessed   Lower Extremity Assessment Lower Extremity Assessment: Defer to PT evaluation   Cervical / Trunk Assessment Cervical / Trunk Assessment: Normal   Communication Communication Communication: No difficulties   Cognition Arousal/Alertness: Awake/alert Behavior During Therapy: WFL for tasks assessed/performed Overall Cognitive Status: Within Functional Limits for tasks assessed                                Home Living Family/patient expects to  be discharged to:: Private residence Living Arrangements: Spouse/significant other Available Help at Discharge: Other (Comment) (husband to have AVR soon- not scheduled) Type of Home: House Home Access: Stairs to enter [employer] of Steps: 1 Entrance Stairs-Rails: None Home Layout: Two level;Laundry or work area in Building surveyor of Steps: chair lift to basement    [employer] Shower/Tub: Teacher, early years/pre: Handicapped height     Home Equipment: Mining engineer - 2 wheels;Shower seat          Prior Functioning/Environment Level of Independence: Independent with assistive device(s)        Comments: uses quad cane; doesn't do steps due to knee pain/buckling    OT Diagnosis: Generalized weakness;Acute pain   OT Problem List: Decreased strength;Decreased range of motion;Decreased activity tolerance;Impaired balance (sitting and/or standing);Pain   OT Treatment/Interventions: Self-care/ADL training;Therapeutic exercise;Energy conservation;DME and/or AE instruction;Therapeutic activities;Patient/family education;Balance training    OT Goals(Current goals can be found in the care plan section) Acute Rehab OT Goals Patient Stated Goal: to get back home OT Goal Formulation: With patient Time For Goal Achievement: 09/29/14 Potential to Achieve Goals: Good ADL Goals Pt Will Perform Lower Body Bathing: with min assist;sit to/from stand Pt Will Perform Lower Body Dressing: with min assist;sit to/from stand Pt Will Transfer to Toilet: with min assist;stand pivot transfer;bedside commode Pt Will Perform Toileting - Clothing Manipulation and hygiene: with min assist;sit to/from stand  OT Frequency: Min 2X/week           Co-evaluation PT/OT/SLP Co-Evaluation/Treatment: Yes Reason for Co-Treatment: For patient/therapist safety   OT goals addressed during session: ADL's and self-care      End of Session Equipment Utilized During Treatment: Gait belt;Rolling walker Nurse Communication: Mobility status  Activity Tolerance: Patient limited by pain Patient left: in bed;with call bell/phone within reach   Time: 0850-0916 OT Time Calculation (min): 26 min Charges:  OT General Charges $OT Visit: 1 Procedure OT Evaluation $Initial OT Evaluation Tier I: 1 Procedure G-Codes:    Juluis Rainier Sep 25, 2014, 9:58 AM  Cyndie Chime, OTR/L Occupational Therapist [phone] (pager)

## 2014-09-15 NOTE — Progress Notes (Signed)
Rehab Admissions Coordinator Note:  Patient was screened by Thimothy Barretta L for appropriateness for an Inpatient Acute Rehab Consult.  At this time, we are recommending Inpatient Rehab consult.  [name] L 09/15/2014, 10:12 AM  I can be reached at [phone].
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
## 2014-09-15 NOTE — Evaluation (Signed)
Physical Therapy Evaluation Patient Details Name: [name] MRN: [id number] DOB: Apr 06, 1926 Today's Date: 09/15/2014   History of Present Illness  Pt is an 81 y.o. Female with PMH COPD, PAF on anticoagulants, aneurysm, HTN, pacemaker admitted 09/14/14 after a fall at home resulting in left superior ramus fx.   Clinical Impression  Pt admitted with above diagnosis. Pt limited by pain this date. Demonstrated overall good strength and anticipate good progress once pain under control. Pt currently with functional limitations due to the deficits listed below (see PT Problem List).  Pt will benefit from skilled PT to increase their independence and safety with mobility to allow discharge to the venue listed below.       Follow Up Recommendations CIR    Equipment Recommendations  None recommended by PT    Recommendations for Other Services Rehab consult     Precautions / Restrictions Precautions Precautions: Fall Restrictions LLE Weight Bearing: Weight bearing as tolerated      Mobility  Bed Mobility Overal bed mobility: Needs Assistance;+ 2 for safety/equipment Bed Mobility: Supine to Sit;Sit to Supine     Supine to sit: Mod assist;+2 for physical assistance Sit to supine: Mod assist;+2 for safety/equipment   General bed mobility comments: Mod A to manage LEs on/off bed; use of bed pad to rotate hips. Pt with good UB strength and trunk control. Limited by pain.   Transfers Overall transfer level: Needs assistance Equipment used: Rolling walker (2 wheeled) Transfers: Sit to/from Stand Sit to Stand: Mod assist;+2 physical assistance         General transfer comment: Mod A to power up from EOB and manage RW. Pt unable to take steps forward with RLE due to LLE pain. Facilitated weight shift to R side for Pt to step forward with LLE.   Ambulation/Gait Ambulation/Gait assistance: Min assist;+2 safety/equipment Ambulation Distance (Feet): 1 Feet Assistive device: Rolling walker  (2 wheeled) Gait Pattern/deviations: Step-to pattern;Decreased stance time - left;Decreased weight shift to left;Antalgic     General Gait Details: pt able to advance LLE forward and backward x 2 steps; could not advance RLE due to pain in Lt hip/pelvis when attempted twice  Stairs            Wheelchair Mobility    Modified Rankin (Stroke Patients Only)       Balance Overall balance assessment: History of Falls                                           Pertinent Vitals/Pain Pain Assessment: 0-10 Pain Score: 9  Pain Location: Lt hip/pelvis/groin Pain Descriptors / Indicators: Grimacing;Sharp Pain Intervention(s): Limited activity within patient's tolerance;Monitored during session;Repositioned;Patient requesting pain meds-RN notified    Home Living Family/patient expects to be discharged to:: Private residence Living Arrangements: Spouse/significant other Available Help at Discharge: Other (Comment) (husband to have AVR soon- not scheduled) Type of Home: House Home Access: Stairs to enter Entrance Stairs-Rails: None Entrance Stairs-Number of Steps: 1 Home Layout: Two level;Laundry or work area in [place]: Mining engineer - 2 wheels;Shower seat      Prior Function Level of Independence: Independent with assistive device(s)         Comments: uses quad cane; doesn't do steps due to knee pain/buckling     Hand Dominance        Extremity/Trunk Assessment   Upper Extremity Assessment: Overall [facility]  for tasks assessed;Defer to OT evaluation           Lower Extremity Assessment: LLE deficits/detail   LLE Deficits / Details: AAROM hip to 90 (seated) and 60 supine; limited strength due to pain; dorsiflexion 5/5;   Cervical / Trunk Assessment: Normal  Communication   Communication: No difficulties  Cognition Arousal/Alertness: Awake/alert Behavior During Therapy: WFL for tasks assessed/performed Overall Cognitive  Status: Within Functional Limits for tasks assessed                      General Comments      Exercises        Assessment/Plan    PT Assessment Patient needs continued PT services  PT Diagnosis Difficulty walking;Acute pain   PT Problem List Decreased range of motion;Decreased activity tolerance;Decreased balance;Decreased mobility;Decreased knowledge of use of DME;Pain  PT Treatment Interventions DME instruction;Gait training;Stair training;Functional mobility training;Therapeutic activities;Therapeutic exercise;Patient/family education   PT Goals (Current goals can be found in the Care Plan section) Acute Rehab PT Goals Patient Stated Goal: to get back home PT Goal Formulation: With patient Time For Goal Achievement: 09/22/14 Potential to Achieve Goals: Good    Frequency Min 5X/week   Barriers to discharge        Co-evaluation PT/OT/SLP Co-Evaluation/Treatment: Yes Reason for Co-Treatment: For patient/therapist safety PT goals addressed during session: Mobility/safety with mobility;Proper use of DME;Strengthening/ROM OT goals addressed during session: ADL's and self-care       End of Session Equipment Utilized During Treatment: Gait belt Activity Tolerance: Patient limited by pain Patient left: in bed;with call bell/phone within reach           Time: 0851-0916 PT Time Calculation (min) (ACUTE ONLY): 25 min   Charges:   PT Evaluation $Initial PT Evaluation Tier I: 1 Procedure     PT G Codes:        [name] 09-17-14, 12:08 PM Pager [phone]

## 2014-09-15 NOTE — Care Management Note (Signed)
Case Management Note  Patient Details  Name: Kirstine Jacquin MRN: [id number] Date of Birth: 20-May-1925  Subjective/Objective:   Pt admitted s/p fall with pelvic fx- nonsurgical management                 Action/Plan: PTA pt lived at home with spouse- PT/OT evals, CIR recommending- need order for CIR consult  Expected Discharge Date:  09/16/14               Expected Discharge Plan:  [place]  In-House Referral:     Discharge planning Services  CM Consult  Post Acute Care Choice:    Choice offered to:     DME Arranged:    DME Agency:     HH Arranged:    HH Agency:     Status of Service:  In process, will continue to follow  Medicare Important Message Given:    Date Medicare IM Given:    Medicare IM give by:    Date Additional Medicare IM Given:    Additional Medicare Important Message give by:     If discussed at [place] of Stay Meetings, dates discussed:    Additional Comments:  Dawayne Patricia, RN 09/15/2014, 11:26 AM

## 2014-09-15 NOTE — Progress Notes (Signed)
Utilization review completed.  

## 2014-09-15 NOTE — Progress Notes (Signed)
09/15/2014 1510 Received report from [place].  Pt. Resting in bed, no c/o voiced at this time.  Call bell in reach. [place]

## 2014-09-15 NOTE — Progress Notes (Signed)
[facility] Progress Note   [name] ZCH:[id number] DOB: 1926/02/19 DOA: 09/14/2014 PCP: Merrilee Seashore, MD  Brief narrative: [name] is a 81 y.o. female who was admitted for a pubic ramus fracture after a fall at home due to losing her balance.    Subjective: Having more pain in left hip rather than suprapubic area. No other complaints. No nausea, vomiting, abdominal pain, chest pain or dyspnea.   Assessment/Plan: Principal Problem:   Fracture of left superior pubic ramus - PT eval pending- OT recommends CIR-  CIR consult requested - cont Oral Vicodin for pain control   Active Problems:   Contusion of forehead - in relation to fall- stable    HTN (hypertension) - cont Toprol    PAF (paroxysmal atrial fibrillation) -cont Tikosyn and Pradaxa    Pacemaker    Hyperlipidemia - cont statin  H/o renal cell cancer   Code Status: full code Family Communication:  Disposition Plan: CIR eval DVT prophylaxis: Pradax Consultants: Procedures:  Antibiotics: Anti-infectives    None      Objective: Filed Weights   09/14/14 1301 09/15/14 0445  Weight: 72.576 kg (160 lb) 71.532 kg (157 lb 11.2 oz)    Intake/Output Summary (Last 24 hours) at 09/15/14 1127 Last data filed at 09/15/14 0800  Gross per 24 hour  Intake    240 ml  Output      0 ml  Net    240 ml     Vitals Filed Vitals:   09/14/14 1615 09/14/14 1700 09/14/14 2041 09/15/14 0445  BP: 116/90 108/89 111/84 102/58  Pulse: 105 105 104 78  Temp:    97.7 F (36.5 C)  TempSrc:   Oral Oral  Resp: 19 11 18 19   Height:      Weight:    71.532 kg (157 lb 11.2 oz)  SpO2: 90% 96% 96% 93%    Exam:  General:  Pt is alert, not in acute distress- bruising in left periorbital area with a small hematoma  HEENT: No icterus, No thrush  Cardiovascular: regular rate and rhythm, S1/S2 No murmur  Respiratory: clear to auscultation bilaterally   Abdomen: Soft, +Bowel sounds, non tender, non  distended, no guarding  MSK: No LE edema, cyanosis or clubbing  Data Reviewed: Basic Metabolic Panel:  Recent Labs Lab 09/14/14 1448 09/14/14 1450  NA 139  --   K 3.9  --   CL 108  --   CO2 20*  --   GLUCOSE 131*  --   BUN 13  --   CREATININE 0.87 1.00  CALCIUM 8.8*  --    Liver Function Tests: No results for input(s): AST, ALT, ALKPHOS, BILITOT, PROT, ALBUMIN in the last 168 hours. No results for input(s): LIPASE, AMYLASE in the last 168 hours. No results for input(s): AMMONIA in the last 168 hours. CBC:  Recent Labs Lab 09/14/14 1448  WBC 10.7*  NEUTROABS 8.4*  HGB 15.1*  HCT 43.8  MCV 94.6  PLT 140*   Cardiac Enzymes: No results for input(s): CKTOTAL, CKMB, CKMBINDEX, TROPONINI in the last 168 hours. BNP (last 3 results) No results for input(s): BNP in the last 8760 hours.  ProBNP (last 3 results)  Recent Labs  11/24/13 1340  PROBNP 819.5*    CBG: No results for input(s): GLUCAP in the last 168 hours.  No results found for this or any previous visit (from the past 240 hour(s)).   Studies: Dg Chest 1 View  09/14/2014   CLINICAL DATA:  Fall  today. Left hip and forehead injury. No current chest complaints. History of hypertension and COPD. Initial encounter.  EXAM: CHEST  1 VIEW  COMPARISON:  07/17/2014 and 11/24/2013 radiographs.  FINDINGS: 1339 hours. Left subclavian pacemaker leads appear unchanged within the right atrium and right ventricle. The heart size and mediastinal contours are stable. There is atherosclerosis of the aortic arch. The lungs are clear. There is no pleural effusion or pneumothorax. No acute fractures identified. Embolization coils in the upper abdomen are partially imaged.  IMPRESSION: Stable chest.  No acute cardiopulmonary process.   Electronically Signed   By: Richardean Sale M.D.   On: 09/14/2014 13:58   Ct Head Wo Contrast  09/14/2014   CLINICAL DATA:  Patient fell and hip left-side of head. Left eye pain with hematoma. History of  hypertension, melanoma and TIA's. Initial encounter.  EXAM: CT HEAD WITHOUT CONTRAST  CT CERVICAL SPINE WITHOUT CONTRAST  TECHNIQUE: Multidetector CT imaging of the head and cervical spine was performed following the standard protocol without intravenous contrast. Multiplanar CT image reconstructions of the cervical spine were also generated.  COMPARISON:  Prior studies 02/15/2006.  FINDINGS: CT HEAD FINDINGS  There is mildly progressive generalized atrophy. No evidence of acute intracranial hemorrhage, mass lesion, brain edema or extra-axial fluid collection. There is mild periventricular white matter disease which is only slightly progressive from the prior study.  Soft tissue swelling is present in left frontotemporal scalp. The visualized paranasal sinuses are clear. There is a chronic partial left mastoid effusion which appears unchanged. The right mastoid air cells and middle ears are clear.  CT CERVICAL SPINE FINDINGS  The alignment is stable with mild straightening. There is no evidence of acute fracture or traumatic subluxation. There is multilevel spondylosis with disc space loss, uncinate spurring and facet hypertrophy, minimally progressive from the prior study.  No acute soft tissue abnormalities identified. Carotid artery calcifications are present bilaterally. Bilateral thyroid nodularity and calcifications of the left are grossly stable.  IMPRESSION: 1. No acute intracranial, calvarial or cervical spine findings demonstrated. Left frontotemporal scalp soft tissue injury noted. 2. Mild progression in generalized cerebral atrophy. 3. Multilevel cervical spondylosis.   Electronically Signed   By: Richardean Sale M.D.   On: 09/14/2014 14:33   Ct Cervical Spine Wo Contrast  09/14/2014   CLINICAL DATA:  Patient fell and hip left-side of head. Left eye pain with hematoma. History of hypertension, melanoma and TIA's. Initial encounter.  EXAM: CT HEAD WITHOUT CONTRAST  CT CERVICAL SPINE WITHOUT CONTRAST   TECHNIQUE: Multidetector CT imaging of the head and cervical spine was performed following the standard protocol without intravenous contrast. Multiplanar CT image reconstructions of the cervical spine were also generated.  COMPARISON:  Prior studies 02/15/2006.  FINDINGS: CT HEAD FINDINGS  There is mildly progressive generalized atrophy. No evidence of acute intracranial hemorrhage, mass lesion, brain edema or extra-axial fluid collection. There is mild periventricular white matter disease which is only slightly progressive from the prior study.  Soft tissue swelling is present in left frontotemporal scalp. The visualized paranasal sinuses are clear. There is a chronic partial left mastoid effusion which appears unchanged. The right mastoid air cells and middle ears are clear.  CT CERVICAL SPINE FINDINGS  The alignment is stable with mild straightening. There is no evidence of acute fracture or traumatic subluxation. There is multilevel spondylosis with disc space loss, uncinate spurring and facet hypertrophy, minimally progressive from the prior study.  No acute soft tissue abnormalities identified. Carotid artery calcifications  are present bilaterally. Bilateral thyroid nodularity and calcifications of the left are grossly stable.  IMPRESSION: 1. No acute intracranial, calvarial or cervical spine findings demonstrated. Left frontotemporal scalp soft tissue injury noted. 2. Mild progression in generalized cerebral atrophy. 3. Multilevel cervical spondylosis.   Electronically Signed   By: Richardean Sale M.D.   On: 09/14/2014 14:33   Ct Abdomen Pelvis W Contrast  09/14/2014   CLINICAL DATA:  Patient fell today.  Chest and back pain.  EXAM: CT ABDOMEN AND PELVIS WITH CONTRAST  TECHNIQUE: Multidetector CT imaging of the abdomen and pelvis was performed using the standard protocol following bolus administration of intravenous contrast.  CONTRAST:  47mL OMNIPAQUE IOHEXOL 300 MG/ML  SOLN  COMPARISON:  06/14/2013   FINDINGS: Lower chest: The lung bases are clear. No pleural effusion or pneumothorax. The heart is mildly enlarged. No pericardial effusion. There is tortuosity and mild ectasia of the thoracic aorta but no dissection. Stable cystic lesion just posterior to the descending thoracic aorta likely E a large perineural cyst or dilated nerve root sheath.  Hepatobiliary: No focal hepatic lesions or acute hepatic injury. Numerous coils are noted in the region of the hepatic artery. Mild intrahepatic biliary dilatation. Common bile duct is markedly dilated but this is a stable finding. Prior common bile duct stones noted.  Pancreas: No acute pancreatic injury.  No mass or inflammation.  Spleen: The spleen is intact.  No focal lesions.  Adrenals/Urinary Tract: Stable DN nodularity of both adrenal glands. No acute renal injury. There is a stable 3 cm angioma mild lipoma projecting off the lower pole region of the left kidney.  Stomach/Bowel: The stomach, duodenum, small bowel and colon are grossly normal without oral contrast. No obvious acute injury. No free air or free fluid.  Vascular/Lymphatic: No mesenteric or retroperitoneal mass or adenopathy. Stable abdominal aortic aneurysm with maximal measurements of 3 x 3 cm. The major branch vessels are patent. The major venous structures are patent.  Other: The uterus and ovaries are unremarkable. The bladder is normal. There is a superior pubic ramus fracture on the right side and associated extra peritoneal pelvic hematoma. Probable hematoma in the obturator internus muscle also. No inguinal mass.  Musculoskeletal: Both hips are normally located. No hip fracture. The pubic symphysis and SI joints are intact. No definite sacral fractures.  IMPRESSION: 1. No acute intra-abdominal injury is identified. 2. Left superior pubic ramus fracture and associated extraperitoneal pelvic hematoma. No hip fracture or sacral fractures identified. 3. Stable 3 cm angiomyolipoma involving the  left kidney. 4. Stable biliary dilatation. 5. Stable 3 cm infrarenal abdominal aortic aneurysm and advanced atherosclerotic calcifications involving the aorta.   Electronically Signed   By: Marijo Sanes M.D.   On: 09/14/2014 17:39   Dg Hip Unilat With Pelvis 2-3 Views Left  09/14/2014   CLINICAL DATA:  81 year old female who fell from standing on to the left hip with acute pain. Initial encounter.  EXAM: LEFT HIP (WITH PELVIS) 2-3 VIEWS  COMPARISON:  CT Abdomen and Pelvis 06/03/2012.  FINDINGS: Acute left superior pubic ramus fracture with 1/2 shaft with displacement. Acute nondisplaced inferior pubic ramus fracture on that side also suspected.  Elsewhere the pelvis appears intact. Possible healed medial aspect right superior pubic ramus fracture. Both femoral heads are normally located. Proximal left femur is intact. *INSERT* aortoiliac  IMPRESSION: 1. Mildly displaced left superior pubic ramus fracture. Suspect nondisplaced inferior left pubic ramus fracture. 2. No other acute fracture or dislocation identified about the  left hip or pelvis.   Electronically Signed   By: Genevie Ann M.D.   On: 09/14/2014 13:58    Scheduled Meds:  Scheduled Meds: . dabigatran  75 mg Oral Q12H  . docusate sodium  100 mg Oral BID  . dofetilide  250 mcg Oral BID  . famotidine  20 mg Oral BID  . fluorometholone  1 drop Both Eyes Daily  . magnesium oxide  400 mg Oral QPC lunch  . metoprolol succinate  50 mg Oral BID  . multivitamin with minerals  1 tablet Oral [id number]  . ondansetron (ZOFRAN) IV  4 mg Intravenous Once  . potassium chloride SA  20 mEq Oral Daily  . rosuvastatin  5 mg Oral QPM  . sodium chloride  3 mL Intravenous Q12H  . sucralfate  1 g Oral BID   Continuous Infusions:   Time spent on care of this patient: 35 min   [place], MD 09/15/2014, 11:27 AM  LOS: 1 day   [facility] Office  [phone] Pager - Text Page per [email or web address]  If 7PM-7AM, please contact  night-coverage [email or web address]

## 2014-09-16 ENCOUNTER — Inpatient Hospital Stay (HOSPITAL_COMMUNITY)
Admission: RE | Admit: 2014-09-16 | Discharge: 2014-09-29 | DRG: 561 | Disposition: A | Discharge status: Home Health | Payer: Medicare Other | Source: Intra-hospital | Attending: Physical Medicine & Rehabilitation | Admitting: Physical Medicine & Rehabilitation

## 2014-09-16 DIAGNOSIS — R0789 Other chest pain: Secondary | ICD-10-CM

## 2014-09-16 DIAGNOSIS — S32512A Fracture of superior rim of left pubis, initial encounter for closed fracture: Secondary | ICD-10-CM | POA: Diagnosis present

## 2014-09-16 DIAGNOSIS — M25561 Pain in right knee: Secondary | ICD-10-CM | POA: Diagnosis present

## 2014-09-16 DIAGNOSIS — M25562 Pain in left knee: Secondary | ICD-10-CM | POA: Diagnosis present

## 2014-09-16 DIAGNOSIS — I714 Abdominal aortic aneurysm, without rupture, unspecified: Secondary | ICD-10-CM | POA: Diagnosis present

## 2014-09-16 DIAGNOSIS — S32512S Fracture of superior rim of left pubis, sequela: Secondary | ICD-10-CM | POA: Diagnosis not present

## 2014-09-16 DIAGNOSIS — I38 Endocarditis, valve unspecified: Secondary | ICD-10-CM | POA: Diagnosis not present

## 2014-09-16 DIAGNOSIS — S329XXA Fracture of unspecified parts of lumbosacral spine and pelvis, initial encounter for closed fracture: Secondary | ICD-10-CM | POA: Diagnosis present

## 2014-09-16 DIAGNOSIS — R630 Anorexia: Secondary | ICD-10-CM | POA: Diagnosis present

## 2014-09-16 DIAGNOSIS — S3681XD Injury of peritoneum, subsequent encounter: Secondary | ICD-10-CM

## 2014-09-16 DIAGNOSIS — Z6829 Body mass index (BMI) 29.0-29.9, adult: Secondary | ICD-10-CM

## 2014-09-16 DIAGNOSIS — K219 Gastro-esophageal reflux disease without esophagitis: Secondary | ICD-10-CM | POA: Diagnosis present

## 2014-09-16 DIAGNOSIS — I480 Paroxysmal atrial fibrillation: Secondary | ICD-10-CM | POA: Diagnosis present

## 2014-09-16 DIAGNOSIS — E875 Hyperkalemia: Secondary | ICD-10-CM | POA: Diagnosis present

## 2014-09-16 DIAGNOSIS — S32592D Other specified fracture of left pubis, subsequent encounter for fracture with routine healing: Principal | ICD-10-CM

## 2014-09-16 DIAGNOSIS — I4811 Longstanding persistent atrial fibrillation: Secondary | ICD-10-CM | POA: Diagnosis present

## 2014-09-16 DIAGNOSIS — Z7901 Long term (current) use of anticoagulants: Secondary | ICD-10-CM | POA: Diagnosis not present

## 2014-09-16 DIAGNOSIS — I10 Essential (primary) hypertension: Secondary | ICD-10-CM | POA: Diagnosis present

## 2014-09-16 DIAGNOSIS — H5441 Blindness, right eye, normal vision left eye: Secondary | ICD-10-CM | POA: Diagnosis present

## 2014-09-16 HISTORY — DX: Abdominal aortic aneurysm, without rupture, unspecified: I71.40

## 2014-09-16 HISTORY — DX: Abdominal aortic aneurysm, without rupture: I71.4

## 2014-09-16 LAB — URINALYSIS, ROUTINE W REFLEX MICROSCOPIC
Bilirubin Urine: NEGATIVE
GLUCOSE, UA: NEGATIVE mg/dL
HGB URINE DIPSTICK: NEGATIVE
KETONES UR: NEGATIVE mg/dL
Nitrite: NEGATIVE
PH: 5.5 (ref 5.0–8.0)
PROTEIN: NEGATIVE mg/dL
Specific Gravity, Urine: 1.013 (ref 1.005–1.030)
Urobilinogen, UA: 0.2 mg/dL (ref 0.0–1.0)

## 2014-09-16 LAB — BASIC METABOLIC PANEL
ANION GAP: 8 (ref 5–15)
BUN: 13 mg/dL (ref 6–20)
CALCIUM: 8.6 mg/dL — AB (ref 8.9–10.3)
CO2: 23 mmol/L (ref 22–32)
Chloride: 107 mmol/L (ref 101–111)
Creatinine, Ser: 0.85 mg/dL (ref 0.44–1.00)
GFR calc Af Amer: >60 mL/min (ref >60)
GLUCOSE: 94 mg/dL (ref 70–99)
Potassium: 5.7 mmol/L — ABNORMAL HIGH (ref 3.5–5.1)
Sodium: 138 mmol/L (ref 135–145)

## 2014-09-16 LAB — MAGNESIUM: Magnesium: 2.2 mg/dL (ref 1.7–2.4)

## 2014-09-16 LAB — URINE MICROSCOPIC-ADD ON

## 2014-09-16 MED ORDER — MAGNESIUM OXIDE 400 (241.3 MG) MG PO TABS: 400.0000 mg | ORAL_TABLET | Freq: Every day | ORAL | Status: DC

## 2014-09-16 MED ORDER — GUAIFENESIN-DM 100-10 MG/5ML PO SYRP
5.0000 mL | ORAL_SOLUTION | Freq: Four times a day (QID) | ORAL | Status: DC | PRN
  Administered 2014-09-19 – 2014-09-20 (×2): 10 mL via ORAL
  Filled 2014-09-16 (×2): qty 10

## 2014-09-16 MED ORDER — TRAZODONE 25 MG HALF TABLET: 25.0000 mg | ORAL_TABLET | Freq: Every evening | ORAL | Status: DC | PRN

## 2014-09-16 MED ORDER — DABIGATRAN ETEXILATE MESYLATE 75 MG PO CAPS
75.0000 mg | ORAL_CAPSULE | Freq: Two times a day (BID) | ORAL | Status: DC
  Administered 2014-09-16 – 2014-09-29 (×26): 75 mg via ORAL
  Filled 2014-09-16 (×28): qty 1

## 2014-09-16 MED ORDER — HYDROCODONE-ACETAMINOPHEN 5-325 MG PO TABS: 1.0000 | ORAL_TABLET | ORAL | Status: DC | PRN

## 2014-09-16 MED ORDER — ONDANSETRON HCL 4 MG PO TABS: 4.0000 mg | ORAL_TABLET | Freq: Four times a day (QID) | ORAL | Status: DC | PRN

## 2014-09-16 MED ORDER — SENNOSIDES-DOCUSATE SODIUM 8.6-50 MG PO TABS: 2.0000 | ORAL_TABLET | Freq: Every day | ORAL | Status: DC

## 2014-09-16 MED ORDER — DIPHENHYDRAMINE HCL 12.5 MG/5ML PO ELIX: 12.5000 mg | ORAL_SOLUTION | Freq: Four times a day (QID) | ORAL | Status: DC | PRN

## 2014-09-16 MED ORDER — MAGNESIUM OXIDE 400 (241.3 MG) MG PO TABS
400.0000 mg | ORAL_TABLET | Freq: Every day | ORAL | Status: DC
  Administered 2014-09-17 – 2014-09-29 (×13): 400 mg via ORAL
  Filled 2014-09-16 (×15): qty 1

## 2014-09-16 MED ORDER — MAGNESIUM OXIDE 400 MG PO TABS
400.0000 mg | ORAL_TABLET | Freq: Every day | ORAL | Status: DC
  Administered 2014-09-16: 400 mg via ORAL

## 2014-09-16 MED ORDER — METHOCARBAMOL 500 MG PO TABS: 500.0000 mg | ORAL_TABLET | Freq: Four times a day (QID) | ORAL | Status: DC | PRN

## 2014-09-16 MED ORDER — ACETAMINOPHEN 325 MG PO TABS
325.0000 mg | ORAL_TABLET | ORAL | Status: DC | PRN
  Administered 2014-09-17 (×2): 650 mg via ORAL
  Administered 2014-09-17: 325 mg via ORAL
  Administered 2014-09-18 – 2014-09-19 (×3): 650 mg via ORAL
  Filled 2014-09-16 (×4): qty 2
  Filled 2014-09-16: qty 1
  Filled 2014-09-16: qty 2

## 2014-09-16 MED ORDER — ALUM & MAG HYDROXIDE-SIMETH 200-200-20 MG/5ML PO SUSP: 30.0000 mL | ORAL | Status: DC | PRN

## 2014-09-16 MED ORDER — THERA M PLUS PO TABS: 1.0000 | ORAL_TABLET | ORAL | Status: DC

## 2014-09-16 MED ORDER — ROSUVASTATIN CALCIUM 5 MG PO TABS: 5.0000 mg | ORAL_TABLET | Freq: Every evening | ORAL | Status: DC

## 2014-09-16 MED ORDER — ONDANSETRON 4 MG PO TBDP: 4.0000 mg | ORAL_TABLET | Freq: Three times a day (TID) | ORAL | Status: DC | PRN

## 2014-09-16 MED ORDER — ZOLPIDEM TARTRATE 5 MG PO TABS: 5.0000 mg | ORAL_TABLET | Freq: Every evening | ORAL | Status: DC | PRN

## 2014-09-16 MED ORDER — FLEET ENEMA 7-19 GM/118ML RE ENEM: 1.0000 | ENEMA | Freq: Once | RECTAL | Status: DC | PRN

## 2014-09-16 MED ORDER — ACETAMINOPHEN 325 MG PO TABS: 325.0000 mg | ORAL_TABLET | ORAL | Status: DC | PRN

## 2014-09-16 MED ORDER — DABIGATRAN ETEXILATE MESYLATE 75 MG PO CAPS: 75.0000 mg | ORAL_CAPSULE | Freq: Two times a day (BID) | ORAL | Status: DC

## 2014-09-16 MED ORDER — BISACODYL 10 MG RE SUPP: 10.0000 mg | Freq: Every day | RECTAL | Status: DC | PRN

## 2014-09-16 MED ORDER — METHOCARBAMOL 500 MG PO TABS
500.0000 mg | ORAL_TABLET | Freq: Four times a day (QID) | ORAL | Status: DC | PRN
  Administered 2014-09-19 – 2014-09-24 (×10): 500 mg via ORAL
  Filled 2014-09-16 (×10): qty 1

## 2014-09-16 MED ORDER — DOFETILIDE 250 MCG PO CAPS
250.0000 ug | ORAL_CAPSULE | Freq: Two times a day (BID) | ORAL | Status: DC
  Administered 2014-09-16 – 2014-09-29 (×26): 250 ug via ORAL
  Filled 2014-09-16 (×28): qty 1

## 2014-09-16 MED ORDER — ADULT MULTIVITAMIN W/MINERALS CH: 1.0000 | ORAL_TABLET | ORAL | Status: DC

## 2014-09-16 MED ORDER — SENNOSIDES-DOCUSATE SODIUM 8.6-50 MG PO TABS
2.0000 | ORAL_TABLET | Freq: Every day | ORAL | Status: DC
  Administered 2014-09-16 – 2014-09-28 (×12): 2 via ORAL
  Filled 2014-09-16 (×14): qty 2

## 2014-09-16 MED ORDER — HYDROCODONE-ACETAMINOPHEN 5-325 MG PO TABS
1.0000 | ORAL_TABLET | ORAL | Status: DC | PRN
  Administered 2014-09-17 – 2014-09-29 (×39): 1 via ORAL
  Filled 2014-09-16 (×43): qty 1

## 2014-09-16 MED ORDER — GUAIFENESIN-DM 100-10 MG/5ML PO SYRP: 5.0000 mL | ORAL_SOLUTION | Freq: Four times a day (QID) | ORAL | Status: DC | PRN

## 2014-09-16 MED ORDER — FLUOROMETHOLONE 0.1 % OP SUSP: 1.0000 [drp] | Freq: Every day | OPHTHALMIC | Status: DC

## 2014-09-16 MED ORDER — ONDANSETRON HCL 4 MG/2ML IJ SOLN: 4.0000 mg | Freq: Four times a day (QID) | INTRAMUSCULAR | Status: DC | PRN

## 2014-09-16 MED ORDER — ADULT MULTIVITAMIN W/MINERALS CH
1.0000 | ORAL_TABLET | Freq: Every day | ORAL | Status: DC
  Administered 2014-09-17 – 2014-09-29 (×13): 1 via ORAL
  Filled 2014-09-16 (×14): qty 1

## 2014-09-16 MED ORDER — FLUOROMETHOLONE 0.1 % OP SUSP
1.0000 [drp] | Freq: Every day | OPHTHALMIC | Status: DC
  Administered 2014-09-17 – 2014-09-29 (×13): 1 [drp] via OPHTHALMIC
  Filled 2014-09-16: qty 5

## 2014-09-16 MED ORDER — CALCIUM CARBONATE-VITAMIN D 250-125 MG-UNIT PO TABS: 1.0000 | ORAL_TABLET | Freq: Two times a day (BID) | ORAL | Status: DC

## 2014-09-16 MED ORDER — METOPROLOL SUCCINATE ER 50 MG PO TB24
50.0000 mg | ORAL_TABLET | Freq: Two times a day (BID) | ORAL | Status: DC
Start: 2014-09-16 — End: 2014-09-29
  Administered 2014-09-16 – 2014-09-29 (×26): 50 mg via ORAL
  Filled 2014-09-16 (×28): qty 1

## 2014-09-16 MED ORDER — METOPROLOL SUCCINATE ER 50 MG PO TB24: 50.0000 mg | ORAL_TABLET | Freq: Two times a day (BID) | ORAL | Status: DC

## 2014-09-16 MED ORDER — CALCIUM CARBONATE-VITAMIN D 500-200 MG-UNIT PO TABS
1.0000 | ORAL_TABLET | Freq: Two times a day (BID) | ORAL | Status: DC
  Administered 2014-09-16 – 2014-09-29 (×26): 1 via ORAL
  Filled 2014-09-16 (×28): qty 1

## 2014-09-16 MED ORDER — FAMOTIDINE 20 MG PO TABS: 20.0000 mg | ORAL_TABLET | Freq: Two times a day (BID) | ORAL | Status: DC

## 2014-09-16 MED ORDER — DOFETILIDE 250 MCG PO CAPS: 250.0000 ug | ORAL_CAPSULE | Freq: Two times a day (BID) | ORAL | Status: DC

## 2014-09-16 MED ORDER — SUCRALFATE 1 G PO TABS: 1.0000 g | ORAL_TABLET | Freq: Two times a day (BID) | ORAL | Status: DC

## 2014-09-16 MED ORDER — TRAZODONE HCL 50 MG PO TABS: 25.0000 mg | ORAL_TABLET | Freq: Every evening | ORAL | Status: DC | PRN

## 2014-09-16 MED ORDER — FLEET ENEMA 7-19 GM/118ML RE ENEM: 1.0000 | ENEMA | Freq: Once | RECTAL | Status: AC | PRN

## 2014-09-16 MED ORDER — FAMOTIDINE 10 MG PO TABS
10.0000 mg | ORAL_TABLET | Freq: Two times a day (BID) | ORAL | Status: DC
  Administered 2014-09-16 – 2014-09-17 (×3): 10 mg via ORAL
  Filled 2014-09-16 (×6): qty 1

## 2014-09-16 MED ORDER — ROSUVASTATIN CALCIUM 5 MG PO TABS
5.0000 mg | ORAL_TABLET | Freq: Every evening | ORAL | Status: DC
  Administered 2014-09-16 – 2014-09-28 (×13): 5 mg via ORAL
  Filled 2014-09-16 (×14): qty 1

## 2014-09-16 MED ORDER — SUCRALFATE 1 G PO TABS
1.0000 g | ORAL_TABLET | Freq: Four times a day (QID) | ORAL | Status: DC
  Administered 2014-09-16 – 2014-09-29 (×51): 1 g via ORAL
  Filled 2014-09-16 (×54): qty 1
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
## 2014-09-16 NOTE — Discharge Summary (Signed)
Physician Discharge Summary  [name] YPP:[id number] DOB: 05/12/1926 DOA: 09/14/2014  PCP: Merrilee Seashore, MD  Admit date: 09/14/2014 Discharge date: 09/16/2014  Time spent: 55 minutes  Recommendations for Outpatient Follow-up:  1. Needs to follow-up with Dr. Lyla Glassing with [facility] orthopedics for x-rays in 2 weeks  Discharge Condition: Stable Diet recommendation: Heart healthy low-sodium  Discharge Diagnoses:  Principal Problem:   Fracture of left superior pubic ramus Active Problems:   Contusion of forehead   Chest pain, musculoskeletal   GERD   HTN (hypertension)   PAF (paroxysmal atrial fibrillation)   Pacemaker   Hyperlipidemia   History of present illness:  With h/o HTN, PAF, pacemaker presents after a fall. C/o left groin pain and forehead bruising. She states that she went to answer door, lost her balance and fell onto her left side. Multiple imaging studies significant for left superior ramus fx.She usually walks with a cane. No LOC. Has been seen by Dr. Lyla Glassing, ortho who is recommending weightbearing as tolerated with a walker and to continue Eliquis. No other complaints   Hospital Course:  Principal Problem:  Fracture of left superior pubic ramus - PT and OT have evaluated her and she has been recommended for [facility] inpatient rehabilitation where she will be going today - cont Oral Vicodin for pain control  -Orthopedic surgeon recommended follow-up in his office for repeat pelvic films in 2 weeks  Active Problems:  Contusion of forehead/ chest pain  - left chest wall tender to palpation and I suspect musculoskeletal injury in relation to her fall - contusion also in relation to fall- stable   HTN (hypertension) - cont Toprol being used for rate control as well   PAF (paroxysmal atrial fibrillation) -This patients CHA2DS2-VASc Score is 4 -cont Tikosyn, Metoprolol and Pradaxa - she was noted to have A-fib with RVR through the night into this AM-  HR was in 110-120 range at rest and therefore I requested a cardiac eval- at 10:02 AM, she converted to an atrial paced rhythm with HR now in 70s- no further management needed at this time   Pacemaker   Hyperlipidemia - cont statin  H/o renal cell cancer   Consultations:  cardiology  Discharge Exam: Filed Weights   09/14/14 1301 09/15/14 0445 09/16/14 0527  Weight: 72.576 kg (160 lb) 71.532 kg (157 lb 11.2 oz) 71.215 kg (157 lb)   Filed Vitals:   09/16/14 1016  BP: 130/91  Pulse:   Temp:   Resp:     General: AAO x 3, no distress Cardiovascular: RRR, no murmurs  Respiratory: clear to auscultation bilaterally GI: soft, non-tender, non-distended, bowel sound positive  Discharge Instructions You were cared for by a hospitalist during your hospital stay. If you have any questions about your discharge medications or the care you received while you were in the hospital after you are discharged, you can call the unit and asked to speak with the hospitalist on call if the hospitalist that took care of you is not available. Once you are discharged, your primary care physician will handle any further medical issues. Please note that NO REFILLS for any discharge medications will be authorized once you are discharged, as it is imperative that you return to your primary care physician (or establish a relationship with a primary care physician if you do not have one) for your aftercare needs so that they can reassess your need for medications and monitor your lab values.      Discharge Instructions    Diet -  low sodium heart healthy    Complete by:  As directed      Increase activity slowly    Complete by:  As directed             Medication List    TAKE these medications        acetaminophen 500 MG tablet  Commonly known as:  TYLENOL  Take 1,000-1,500 mg by mouth every 6 (six) hours as needed for mild pain or moderate pain.     CALCIUM + D PO  Take 1 tablet by mouth 2 (two)  times daily.     dabigatran 75 MG Caps capsule  Commonly known as:  PRADAXA  Take 1 capsule (75 mg total) by mouth every 12 (twelve) hours.     dofetilide 250 MCG capsule  Commonly known as:  TIKOSYN  take 1 capsule by mouth twice a day (SCHEDULE IS 8AM AND 8PM,CANNOT VARY MORE THAN 1 HOUR)     Fish Oil 1000 MG Caps  Take 1,000 mg by mouth 2 (two) times daily.     fluorometholone 0.1 % ophthalmic suspension  Commonly known as:  FML  Place 1 drop into both eyes daily.     furosemide 40 MG tablet  Commonly known as:  LASIX  Take 0.5 tablets (20 mg total) by mouth as directed. Take 20mg  Monday-Saturday. Do not take any on Sunday.     HYDROcodone-acetaminophen 5-325 MG per tablet  Commonly known as:  NORCO/VICODIN  Take 1 tablet by mouth every 4 (four) hours as needed.     HYDROcodone-acetaminophen 5-325 MG per tablet  Commonly known as:  NORCO/VICODIN  Take 1 tablet by mouth every 4 (four) hours as needed for moderate pain.     magnesium oxide 400 MG tablet  Commonly known as:  MAG-OX  Take 1 tablet (400 mg total) by mouth daily.     metoprolol succinate 50 MG 24 hr tablet  Commonly known as:  TOPROL-XL  Take 1 tablet (50 mg total) by mouth 2 (two) times daily.     multivitamins ther. w/minerals Tabs tablet  Take 1 tablet by mouth every morning.     ondansetron 4 MG disintegrating tablet  Commonly known as:  ZOFRAN ODT  Take 1 tablet (4 mg total) by mouth every 8 (eight) hours as needed for nausea.     potassium chloride SA 20 MEQ tablet  Commonly known as:  K-DUR,KLOR-CON  Take 1 tablet (20 mEq total) by mouth daily.     ranitidine 150 MG capsule  Commonly known as:  ZANTAC  Take 150 mg by mouth every morning.     rosuvastatin 5 MG tablet  Commonly known as:  CRESTOR  Take 1 tablet (5 mg total) by mouth every evening.     sucralfate 1 G tablet  Commonly known as:  CARAFATE  Take 1 tablet (1 g total) by mouth 4 (four) times daily.       Allergies  Allergen  Reactions  . Protonix [Pantoprazole Sodium] Other (See Comments)    Abdominal pain   Follow-up Information    Follow up with [facility], [place].   Specialty:  Internal Medicine   Contact information:   [address] [place] [address] [phone]        The results of significant diagnostics from this hospitalization (including imaging, microbiology, ancillary and laboratory) are listed below for reference.    Significant Diagnostic Studies: Dg Chest 1 View  09/14/2014   CLINICAL DATA:  Fall today. Left hip and forehead injury. No current chest complaints. History of hypertension and COPD. Initial encounter.  EXAM: CHEST  1 VIEW  COMPARISON:  07/17/2014 and 11/24/2013 radiographs.  FINDINGS: 1339 hours. Left subclavian pacemaker leads appear unchanged within the right atrium and right ventricle. The heart size and mediastinal contours are stable. There is atherosclerosis of the aortic arch. The lungs are clear. There is no pleural effusion or pneumothorax. No acute fractures identified. Embolization coils in the upper abdomen are partially imaged.  IMPRESSION: Stable chest.  No acute cardiopulmonary process.   Electronically Signed   By: Richardean Sale M.D.   On: 09/14/2014 13:58   Ct Head Wo Contrast  09/14/2014   CLINICAL DATA:  Patient fell and hip left-side of head. Left eye pain with hematoma. History of hypertension, melanoma and TIA's. Initial encounter.  EXAM: CT HEAD WITHOUT CONTRAST  CT CERVICAL SPINE WITHOUT CONTRAST  TECHNIQUE: Multidetector CT imaging of the head and cervical spine was performed following the standard protocol without intravenous contrast. Multiplanar CT image reconstructions of the cervical spine were also generated.  COMPARISON:  Prior studies 02/15/2006.  FINDINGS: CT HEAD FINDINGS  There is mildly progressive generalized atrophy. No evidence of acute intracranial hemorrhage, mass lesion, brain edema or extra-axial fluid collection. There  is mild periventricular white matter disease which is only slightly progressive from the prior study.  Soft tissue swelling is present in left frontotemporal scalp. The visualized paranasal sinuses are clear. There is a chronic partial left mastoid effusion which appears unchanged. The right mastoid air cells and middle ears are clear.  CT CERVICAL SPINE FINDINGS  The alignment is stable with mild straightening. There is no evidence of acute fracture or traumatic subluxation. There is multilevel spondylosis with disc space loss, uncinate spurring and facet hypertrophy, minimally progressive from the prior study.  No acute soft tissue abnormalities identified. Carotid artery calcifications are present bilaterally. Bilateral thyroid nodularity and calcifications of the left are grossly stable.  IMPRESSION: 1. No acute intracranial, calvarial or cervical spine findings demonstrated. Left frontotemporal scalp soft tissue injury noted. 2. Mild progression in generalized cerebral atrophy. 3. Multilevel cervical spondylosis.   Electronically Signed   By: Richardean Sale M.D.   On: 09/14/2014 14:33   Ct Cervical Spine Wo Contrast  09/14/2014   CLINICAL DATA:  Patient fell and hip left-side of head. Left eye pain with hematoma. History of hypertension, melanoma and TIA's. Initial encounter.  EXAM: CT HEAD WITHOUT CONTRAST  CT CERVICAL SPINE WITHOUT CONTRAST  TECHNIQUE: Multidetector CT imaging of the head and cervical spine was performed following the standard protocol without intravenous contrast. Multiplanar CT image reconstructions of the cervical spine were also generated.  COMPARISON:  Prior studies 02/15/2006.  FINDINGS: CT HEAD FINDINGS  There is mildly progressive generalized atrophy. No evidence of acute intracranial hemorrhage, mass lesion, brain edema or extra-axial fluid collection. There is mild periventricular white matter disease which is only slightly progressive from the prior study.  Soft tissue swelling  is present in left frontotemporal scalp. The visualized paranasal sinuses are clear. There is a chronic partial left mastoid effusion which appears unchanged. The right mastoid air cells and middle ears are clear.  CT CERVICAL SPINE FINDINGS  The alignment is stable with mild straightening. There is no evidence of acute fracture or traumatic subluxation. There is multilevel spondylosis with disc space loss, uncinate spurring and facet hypertrophy, minimally progressive from the prior study.  No acute soft tissue abnormalities identified. Carotid artery  calcifications are present bilaterally. Bilateral thyroid nodularity and calcifications of the left are grossly stable.  IMPRESSION: 1. No acute intracranial, calvarial or cervical spine findings demonstrated. Left frontotemporal scalp soft tissue injury noted. 2. Mild progression in generalized cerebral atrophy. 3. Multilevel cervical spondylosis.   Electronically Signed   By: Richardean Sale M.D.   On: 09/14/2014 14:33   Ct Abdomen Pelvis W Contrast  09/14/2014   CLINICAL DATA:  Patient fell today.  Chest and back pain.  EXAM: CT ABDOMEN AND PELVIS WITH CONTRAST  TECHNIQUE: Multidetector CT imaging of the abdomen and pelvis was performed using the standard protocol following bolus administration of intravenous contrast.  CONTRAST:  59mL OMNIPAQUE IOHEXOL 300 MG/ML  SOLN  COMPARISON:  06/14/2013  FINDINGS: Lower chest: The lung bases are clear. No pleural effusion or pneumothorax. The heart is mildly enlarged. No pericardial effusion. There is tortuosity and mild ectasia of the thoracic aorta but no dissection. Stable cystic lesion just posterior to the descending thoracic aorta likely E a large perineural cyst or dilated nerve root sheath.  Hepatobiliary: No focal hepatic lesions or acute hepatic injury. Numerous coils are noted in the region of the hepatic artery. Mild intrahepatic biliary dilatation. Common bile duct is markedly dilated but this is a stable  finding. Prior common bile duct stones noted.  Pancreas: No acute pancreatic injury.  No mass or inflammation.  Spleen: The spleen is intact.  No focal lesions.  Adrenals/Urinary Tract: Stable DN nodularity of both adrenal glands. No acute renal injury. There is a stable 3 cm angioma mild lipoma projecting off the lower pole region of the left kidney.  Stomach/Bowel: The stomach, duodenum, small bowel and colon are grossly normal without oral contrast. No obvious acute injury. No free air or free fluid.  Vascular/Lymphatic: No mesenteric or retroperitoneal mass or adenopathy. Stable abdominal aortic aneurysm with maximal measurements of 3 x 3 cm. The major branch vessels are patent. The major venous structures are patent.  Other: The uterus and ovaries are unremarkable. The bladder is normal. There is a superior pubic ramus fracture on the right side and associated extra peritoneal pelvic hematoma. Probable hematoma in the obturator internus muscle also. No inguinal mass.  Musculoskeletal: Both hips are normally located. No hip fracture. The pubic symphysis and SI joints are intact. No definite sacral fractures.  IMPRESSION: 1. No acute intra-abdominal injury is identified. 2. Left superior pubic ramus fracture and associated extraperitoneal pelvic hematoma. No hip fracture or sacral fractures identified. 3. 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 4. Stable biliary dilatation. 5. Stable 3 cm infrarenal abdominal aortic aneurysm and advanced atherosclerotic calcifications involving the aorta.   Electronically Signed   By: Marijo Sanes M.D.   On: 09/14/2014 17:39   Dg Hip Unilat With Pelvis 2-3 Views Left  09/14/2014   CLINICAL DATA:  81 year old female who fell from standing on to the left hip with acute pain. Initial encounter.  EXAM: LEFT HIP (WITH PELVIS) 2-3 VIEWS  COMPARISON:  CT Abdomen and Pelvis 06/03/2012.  FINDINGS: Acute left superior pubic ramus fracture with 1/2 shaft with displacement.  Acute nondisplaced inferior pubic ramus fracture on that side also suspected.  Elsewhere the pelvis appears intact. Possible healed medial aspect right superior pubic ramus fracture. Both femoral heads are normally located. Proximal left femur is intact. *INSERT* aortoiliac  IMPRESSION: 1. Mildly displaced left superior pubic ramus fracture. Suspect nondisplaced inferior left pubic ramus fracture. 2. No other acute fracture or dislocation identified about  the left hip or pelvis.   Electronically Signed   By: Genevie Ann M.D.   On: 09/14/2014 13:58    Microbiology: Recent Results (from the past 240 hour(s))  Urine culture     Status: None   Collection Time: 09/14/14  1:38 PM  Result Value Ref Range Status   Specimen Description URINE, CATHETERIZED  Final   Special Requests NONE  Final   Colony Count   Final    20,OOO COLONIES/ML Performed at [facility]    Culture   Final    Multiple bacterial morphotypes present, none predominant. Suggest appropriate recollection if clinically indicated. Performed at [facility]    Report Status 09/15/2014 FINAL  Final     Labs: Basic Metabolic Panel:  Recent Labs Lab 09/14/14 1448 09/14/14 1450 09/16/14 0351  NA 139  --  138  K 3.9  --  5.7*  CL 108  --  107  CO2 20*  --  23  GLUCOSE 131*  --  94  BUN 13  --  13  CREATININE 0.87 1.00 0.85  CALCIUM 8.8*  --  8.6*  MG  --   --  2.2   Liver Function Tests: No results for input(s): AST, ALT, ALKPHOS, BILITOT, PROT, ALBUMIN in the last 168 hours. No results for input(s): LIPASE, AMYLASE in the last 168 hours. No results for input(s): AMMONIA in the last 168 hours. CBC:  Recent Labs Lab 09/14/14 1448  WBC 10.7*  NEUTROABS 8.4*  HGB 15.1*  HCT 43.8  MCV 94.6  PLT 140*   Cardiac Enzymes: No results for input(s): CKTOTAL, CKMB, CKMBINDEX, TROPONINI in the last 168 hours. BNP: BNP (last 3 results) No results for input(s): BNP in the last 8760 hours.  ProBNP (last 3  results)  Recent Labs  11/24/13 1340  PROBNP 819.5*    CBG: No results for input(s): GLUCAP in the last 168 hours.     SignedDebbe Odea, MD [facility] 09/16/2014, 10:59 AM

## 2014-09-16 NOTE — Progress Notes (Signed)
Rehab admissions - I received medical clearance from Dr. Wynelle Cleveland and have noted cardiologist's note from earlier this am. We have an available rehab bed today and will admit to inpatient rehab later today. I called and updated pt and her husband and will complete admission paperwork with them shortly.  I called and updated Kristi, case Freight forwarder and Butch Penny, Education officer, museum is also aware.   Please call me with any questions. Thanks.  Nanetta Batty, PT Rehabilitation Admissions Coordinator [phone]

## 2014-09-16 NOTE — Care Management (Signed)
Medicare Important Message given? Yes (If Response is "NO", the following Medicare IM given date fields will be blank) Date Medicare IM given: 09/16/14 Medicare IM given by: Jerriah Ines 

## 2014-09-16 NOTE — Progress Notes (Signed)
Rehab admissions - I met with pt and multiple family members (husband, 2 dtrs, grand-dtr, family friend) to share information about our rehab program. Further questions were answered and informational brochures were given. Pt and her family are interested in pursuing inpatient rehab. Family are very supportive.  I then spoke with Dr. Wynelle Cleveland about pt's status. Cardiology consult has now been ordered and Dr. Wynelle Cleveland will get back to me about pt's medical clearance. We could possibly admit pt to inpatient rehab later today pending her medical clearance. Rehab beds are limited for today and tomorrow and this was explained to both pt/family and MD.  I will follow up with pt's case later today as I get updates from Dr. Wynelle Cleveland. I updated Steffanie Dunn, case Freight forwarder and Butch Penny, Education officer, museum as well.  Please call me with any questions. Thanks.  Nanetta Batty, PT Rehabilitation Admissions Coordinator [phone]

## 2014-09-16 NOTE — H&P (Addendum)
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 assess and treat for/with: ADL's, functional mobility, safety, upper extremity strength, adaptive techniques and equipment, ego support, pain  control, activity tolerance. Goals are: mod I. Therapy may proceed with showering this patient. 9. SLP will assess and treat for/with: n/a. Goals are: n/a. 10. Case Management and Social Worker will assess and treat for psychological issues and discharge planning. 11. Team conference will be held weekly to assess progress toward goals and to determine barriers to discharge. 12. Patient will receive at least 3 hours of therapy per day at least 5 days per week. 13. ELOS: 8-13 days  14. Prognosis: excellent     Meredith Staggers, MD,  [facility] 09/16/2014
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
## 2014-09-16 NOTE — Progress Notes (Signed)
Physical Therapy Treatment Patient Details Name: [name] MRN: [id number] DOB: April 01, 1926 Today's Date: 09/16/2014    History of Present Illness Pt is an 81 y.o. Female with PMH COPD, PAF on anticoagulants, aneurysm, HTN, pacemaker admitted 09/14/14 after a fall at home resulting in left superior ramus fx.     PT Comments    Attempted to see pt and completed LLE exercises when pt reporting incr Lt sided chest pain. Noted on monitor pt with frequent PVC's (including up to 5 consecutively). RN notified and in to assess pt. MD also in and felt pain is musculoskeletal/due to fall. Pt reports the PVC's (even multiple in a row) are her baseline. MD okayed further activity, however pt requesting tylenol. Returned after 30 minutes to assist pt OOB. Remain painful, however she was able to progress to stepping to chair today.   Follow Up Recommendations  CIR     Equipment Recommendations  None recommended by PT    Recommendations for Other Services Rehab consult     Precautions / Restrictions Precautions Precautions: Fall Restrictions Weight Bearing Restrictions: No LLE Weight Bearing: Weight bearing as tolerated    Mobility  Bed Mobility Overal bed mobility: Needs Assistance;+ 2 for safety/equipment Bed Mobility: Supine to Sit     Supine to sit: Mod assist;+2 for physical assistance     General bed mobility comments: Mod A to manage LEs off bed; use of bed pad to rotate hips. Pt with good UB strength and trunk control. Limited by pain.   Transfers Overall transfer level: Needs assistance Equipment used: Rolling walker (2 wheeled) Transfers: Sit to/from Stand Sit to Stand: +2 physical assistance;Min assist;+2 safety/equipment         General transfer comment: Min A  and incr time to power up from EOB and manage RW (pt pushing down on bil hand grips with PT stabilizing RW.   Ambulation/Gait Ambulation/Gait assistance: Min assist;+2 physical assistance;+2  safety/equipment Ambulation Distance (Feet): 2 Feet Assistive device: Rolling walker (2 wheeled) Gait Pattern/deviations: Step-to pattern;Decreased stride length;Antalgic;Wide base of support     General Gait Details: pt able to advance LLE forward and with incr time and education re: use of RW pt eventually able to take steps to turn and sit in the chair   Stairs            Wheelchair Mobility    Modified Rankin (Stroke Patients Only)       Balance                                    Cognition Arousal/Alertness: Awake/alert Behavior During Therapy: WFL for tasks assessed/performed Overall Cognitive Status: Within Functional Limits for tasks assessed                      Exercises General Exercises - Lower Extremity Ankle Circles/Pumps: AROM;Both;10 reps Quad Sets: AROM;Left;10 reps Heel Slides: AAROM;Left;5 reps Hip ABduction/ADduction: AAROM;Left;Other reps (comment) (1 rep, too painful)    General Comments        Pertinent Vitals/Pain Pain Assessment: 0-10 Pain Score: 10-Worst pain ever Pain Location: Lt hip/groin; Lt anterior ribs/chest Pain Intervention(s): Limited activity within patient's tolerance;Monitored during session;Premedicated before session;Repositioned;Patient requesting pain meds-RN notified    Home Living                      Prior Function  PT Goals (current goals can now be found in the care plan section) Acute Rehab PT Goals Patient Stated Goal: to get back home PT Goal Formulation: With patient Time For Goal Achievement: 09/22/14 Potential to Achieve Goals: Good Progress towards PT goals: Progressing toward goals    Frequency  Min 5X/week    PT Plan Current plan remains appropriate    Co-evaluation             End of Session Equipment Utilized During Treatment: Gait belt Activity Tolerance: Patient limited by pain Patient left: with call bell/phone within reach;in chair;with  chair alarm set     Time: 0929-1000 (time adjusted to account for interrupted session 1st attempt) PT Time Calculation (min) (ACUTE ONLY): 31 min  Charges:  $Therapeutic Activity: 23-37 mins                    G Codes:      Eron Goble 10-06-14, 11:11 AM Pager [phone]

## 2014-09-17 ENCOUNTER — Inpatient Hospital Stay (HOSPITAL_COMMUNITY): Payer: Medicare Other | Admitting: Occupational Therapy

## 2014-09-17 ENCOUNTER — Inpatient Hospital Stay (HOSPITAL_COMMUNITY): Payer: Medicare Other

## 2014-09-17 DIAGNOSIS — Z7901 Long term (current) use of anticoagulants: Secondary | ICD-10-CM

## 2014-09-17 LAB — COMPREHENSIVE METABOLIC PANEL
ALT: 21 U/L (ref 14–54)
AST: 31 U/L (ref 15–41)
Albumin: 3 g/dL — ABNORMAL LOW (ref 3.5–5.0)
Alkaline Phosphatase: 70 U/L (ref 38–126)
Anion gap: 9 (ref 5–15)
BILIRUBIN TOTAL: 1.3 mg/dL — AB (ref 0.3–1.2)
BUN: 11 mg/dL (ref 6–20)
CHLORIDE: 105 mmol/L (ref 101–111)
CO2: 21 mmol/L — AB (ref 22–32)
CREATININE: 0.79 mg/dL (ref 0.44–1.00)
Calcium: 8.5 mg/dL — ABNORMAL LOW (ref 8.9–10.3)
GFR calc Af Amer: >60 mL/min (ref >60)
Glucose, Bld: 101 mg/dL — ABNORMAL HIGH (ref 70–99)
Potassium: 4.2 mmol/L (ref 3.5–5.1)
Sodium: 135 mmol/L (ref 135–145)
Total Protein: 5.4 g/dL — ABNORMAL LOW (ref 6.5–8.1)

## 2014-09-17 LAB — CBC WITH DIFFERENTIAL/PLATELET
BASOS ABS: 0.1 10*3/uL (ref 0.0–0.1)
Basophils Relative: 1 % (ref 0–1)
Eosinophils Absolute: 0.1 10*3/uL (ref 0.0–0.7)
Eosinophils Relative: 1 % (ref 0–5)
HEMATOCRIT: 43.3 % (ref 36.0–46.0)
Hemoglobin: 14.8 g/dL (ref 12.0–15.0)
LYMPHS PCT: 19 % (ref 12–46)
Lymphs Abs: 1.6 10*3/uL (ref 0.7–4.0)
MCH: 32.6 pg (ref 26.0–34.0)
MCHC: 34.2 g/dL (ref 30.0–36.0)
MCV: 95.4 fL (ref 78.0–100.0)
MONO ABS: 0.9 10*3/uL (ref 0.1–1.0)
MONOS PCT: 10 % (ref 3–12)
Neutro Abs: 6 10*3/uL (ref 1.7–7.7)
Neutrophils Relative %: 69 % (ref 43–77)
Platelets: 140 10*3/uL — ABNORMAL LOW (ref 150–400)
RBC: 4.54 MIL/uL (ref 3.87–5.11)
RDW: 13.1 % (ref 11.5–15.5)
WBC: 8.6 10*3/uL (ref 4.0–10.5)

## 2014-09-17 LAB — URINE CULTURE
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
## 2014-09-17 NOTE — Progress Notes (Signed)
81 y.o. female with history of A Fib--on Pradaxa, h/o melanoma R-eye, PPM who lost her balance and fell onto her left hip and struck her left eye on 09/14/14.  Patient reported that last 3 weeks have been busy and hectic due to multiple appts to get her husband ready for AVR in the future.  Patient with subsequent complains of left groin pain, chest pain and back pain.  She was noted to have left forehead contusion and X rays of left hip revealing mildly displaced left inferior and left superior pubic rami fracture with extraperitoneal pelvic hematoma and stable 3 cm infrarenal AAA. She was evaluated by Dr. Lyla Glassing who recommended WBAT with walker  Subjective/Complaints: Left groin pain, no Knee or leg pain Worried about therapy today Objective: Vital Signs: Blood pressure 144/80, pulse 113, temperature 97.7 F (36.5 C), temperature source Oral, resp. rate 17, height 5\' 2"  (1.575 m), weight 72.6 kg (160 lb 0.9 oz), SpO2 96 %. No results found. Results for orders placed or performed during the hospital encounter of 09/16/14 (from the past 72 hour(s))  CBC WITH DIFFERENTIAL     Status: Abnormal   Collection Time: 09/17/14  6:49 AM  Result Value Ref Range   WBC 8.6 4.0 - 10.5 K/uL   RBC 4.54 3.87 - 5.11 MIL/uL   Hemoglobin 14.8 12.0 - 15.0 g/dL   HCT 43.3 36.0 - 46.0 %   MCV 95.4 78.0 - 100.0 fL   MCH 32.6 26.0 - 34.0 pg   MCHC 34.2 30.0 - 36.0 g/dL   RDW 13.1 11.5 - 15.5 %   Platelets 140 (L) 150 - 400 K/uL   Neutrophils Relative % 69 43 - 77 %   Neutro Abs 6.0 1.7 - 7.7 K/uL   Lymphocytes Relative 19 12 - 46 %   Lymphs Abs 1.6 0.7 - 4.0 K/uL   Monocytes Relative 10 3 - 12 %   Monocytes Absolute 0.9 0.1 - 1.0 K/uL   Eosinophils Relative 1 0 - 5 %   Eosinophils Absolute 0.1 0.0 - 0.7 K/uL   Basophils Relative 1 0 - 1 %   Basophils Absolute 0.1 0.0 - 0.1 K/uL     HEENT: normal Cardio: RRR and no murmur Resp: CTA B/L and unlabored GI: BS positive and NT, ND Extremity:  Edema  LLE Skin:   Bruise Left periorbital Neuro: Alert/Oriented, Anxious and Abnormal Motor 5/5 in BUE, 4/5 RLE, 2- Left HF, KE, 3- ankle Musc/Skel:  Normal and Swelling Left hip and leg, mild, non tender Gen NAD   Assessment/Plan: 1. Functional deficits secondary to Left superior pubic ramus fx s/p fall 09/14/14 which require 3+ hours per day of interdisciplinary therapy in a comprehensive inpatient rehab setting. Physiatrist is providing close team supervision and 24 hour management of active medical problems listed below. Physiatrist and rehab team continue to assess barriers to discharge/monitor patient progress toward functional and medical goals. FIM:                                  Medical Problem List and Plan: 1. Functional deficits secondary to Left superior and inferior pubic rami fracture with extraperitoneal hematoma and severe left groin pain with movement.   2.  DVT Prophylaxis/Anticoagulation: Pharmaceutical: Pradexa 3. Pain Management: Vicodin effective in pain management.   4. Mood: LCSW to follow for evaluation and support.   5. Neuropsych: This patient is capable of making decisions on  her own behalf. 6. Skin/Wound Care: Routine pressure relief measures. Monitor hematoma left eyebrow.   7. Fluids/Electrolytes/Nutrition: Monitor I/O. Offer supplements prn poor intake. Check lytes in am.   8.  Extraperitoneal hematoma: Monitor H/H with serial labs    9.  PAF: Will monitor HR tid and watch for recurrent tachycardia. Continue Tikosyn and metoprolol current dose. Multiple interactions with tikosyn limit SSRI, anti emetic use  10. COPD?: Reports h/o bronchitis and allergies--denies any respiratory issues.    11. HTN: Monitor with tid checks. Continue Metoprolol.  12. Hyperkalemia: Likely due to supplement--dc today. Will recheck labs in am.   13. Leucocytosis: Check UA/UCS due to strong urine odor UA neg   14. Chronic anorexia: will add nutritional supplements.  Takes Carafate at home for questionable  "stomach problems"    LOS (Days) 1 A FACE TO [place] E 09/17/2014, 7:26 AM

## 2014-09-17 NOTE — Progress Notes (Signed)
Occupational Therapy Session Note  Patient Details  Name: [name] MRN: [id number] Date of Birth: 1925/07/01  Today's Date: 09/17/2014 OT Individual Time: 1415-1450 OT Individual Time Calculation (min): 35 min    Short Term Goals: Week 1:  OT Short Term Goal 1 (Week 1): Patient will complete toilet transfers with min assist. OT Short Term Goal 2 (Week 1): Patient will complete LB dressing using AE with min assist. OT Short Term Goal 3 (Week 1): Patient will complete toileting with min assist. OT Short Term Goal 4 (Week 1): Patient will complete toilet transfers via stand step ambulation with RW with min assist. OT Short Term Goal 5 (Week 1): Patient will complete shower transfers via stand step ambulation with RW with min assist.  Skilled Therapeutic Interventions/Progress Updates:  Patient seen this afternoon for occupational therapy with emphasis on functional mobility, activity tolerance, postural control, standing balance, standing tolerance, and overall endurance.  Patient remains limited by increased hip pain and decreased mobility/activity tolerance.  Patient reported she had not had pain medication since earlier in day.  Patient agreeable to treatment.  Patient self propels wheelchair from hospital room to therapy gym via [place] with increased time.  Patient completes sit to/from stand at tabletop in therapy gym with moderate assistance.  Patient completes 1 handed task in standing with CGA to SBA stand balance and stand tolerance 6 minutes 25 seconds.  Patient then completes dynamic standing balance task to toss horseshoes with CGA standing balance, 2 minute standing tolerance, and sit to/from stand moderate to minimum assistance.  Moderate rest breaks required due to pain and fatigue.    Patient returned to room and transfers wheelchair to hospital edge of bed with moderate assistance with use of RW.  Patient transfers EOB to supine with max assist for BLEs up onto bed with patient  completing posterior scoot with BUE support and then eased to supine with patient coming down on elbows and then to back with stabilization at trunk.  Able to maneuver up in bed with  HOB flat and use of rails and assist from therapist mod assist.     Therapy Documentation Precautions:  Precautions Precautions: Fall Precaution Comments: Fall Restrictions Weight Bearing Restrictions: No LLE Weight Bearing: Weight bearing as tolerated General: General Chart Reviewed: Yes Vital Signs: Therapy Vitals Temp: 97.8 F (36.6 C) Temp Source: Oral Pulse Rate: 71 Resp: 19 BP: (!) 118/57 mmHg Patient Position (if appropriate): Sitting Oxygen Therapy SpO2: 93 % O2 Device: Not Delivered Pain: Pain Assessment Pain Assessment: 0-10 Pain Score: 4  Pain Type: Acute pain  See FIM for current functional status  Therapy/Group: Individual Therapy  Osa Craver 09/17/2014, 3:57 PM

## 2014-09-18 ENCOUNTER — Inpatient Hospital Stay (HOSPITAL_COMMUNITY): Payer: Medicare Other | Admitting: Occupational Therapy

## 2014-09-18 DIAGNOSIS — K219 Gastro-esophageal reflux disease without esophagitis: Secondary | ICD-10-CM

## 2014-09-18 MED ORDER — LIDOCAINE 5 % EX PTCH
1.0000 | MEDICATED_PATCH | CUTANEOUS | Status: DC
  Administered 2014-09-18 – 2014-09-24 (×7): 1 via TRANSDERMAL
  Filled 2014-09-18 (×11): qty 1

## 2014-09-18 MED ORDER — FAMOTIDINE 20 MG PO TABS
20.0000 mg | ORAL_TABLET | Freq: Two times a day (BID) | ORAL | Status: DC
  Administered 2014-09-18 – 2014-09-29 (×23): 20 mg via ORAL
  Filled 2014-09-18 (×25): qty 1

## 2014-09-18 NOTE — Progress Notes (Signed)
81 y.o. female with history of A Fib--on Pradaxa, h/o melanoma R-eye, PPM who lost her balance and fell onto her left hip and struck her left eye on 09/14/14.  Patient reported that last 3 weeks have been busy and hectic due to multiple appts to get her husband ready for AVR in the future.  Patient with subsequent complains of left groin pain, chest pain and back pain.  She was noted to have left forehead contusion and X rays of left hip revealing mildly displaced left inferior and left superior pubic rami fracture with extraperitoneal pelvic hematoma and stable 3 cm infrarenal AAA. She was evaluated by Dr. Lyla Glassing who recommended WBAT with walker  Subjective/Complaints: Pain with cough in Left side of chest,  Also has problems with reflux at home, takes zantac and carafate  ROS- no further choking on pills Objective: Vital Signs: Blood pressure 156/68, pulse 70, temperature 97.4 F (36.3 C), temperature source Oral, resp. rate 18, height 5' 2" (1.575 m), weight 73.1 kg (161 lb 2.5 oz), SpO2 94 %. No results found. Results for orders placed or performed during the hospital encounter of 09/16/14 (from the past 72 hour(s))  Comprehensive metabolic panel     Status: Abnormal   Collection Time: 09/17/14  6:49 AM  Result Value Ref Range   Sodium 135 135 - 145 mmol/L   Potassium 4.2 3.5 - 5.1 mmol/L   Chloride 105 101 - 111 mmol/L   CO2 21 (L) 22 - 32 mmol/L   Glucose, Bld 101 (H) 70 - 99 mg/dL   BUN 11 6 - 20 mg/dL   Creatinine, Ser 0.79 0.44 - 1.00 mg/dL   Calcium 8.5 (L) 8.9 - 10.3 mg/dL   Total Protein 5.4 (L) 6.5 - 8.1 g/dL   Albumin 3.0 (L) 3.5 - 5.0 g/dL   AST 31 15 - 41 U/L   ALT 21 14 - 54 U/L   Alkaline Phosphatase 70 38 - 126 U/L   Total Bilirubin 1.3 (H) 0.3 - 1.2 mg/dL   GFR calc non Af Amer >60 >60 mL/min   GFR calc Af Amer >60 >60 mL/min    Comment: (NOTE) The eGFR has been calculated using the CKD EPI equation. This calculation has not been validated in all clinical  situations. eGFR's persistently <60 mL/min signify possible Chronic Kidney Disease.    Anion gap 9 5 - 15  CBC WITH DIFFERENTIAL     Status: Abnormal   Collection Time: 09/17/14  6:49 AM  Result Value Ref Range   WBC 8.6 4.0 - 10.5 K/uL   RBC 4.54 3.87 - 5.11 MIL/uL   Hemoglobin 14.8 12.0 - 15.0 g/dL   HCT 43.3 36.0 - 46.0 %   MCV 95.4 78.0 - 100.0 fL   MCH 32.6 26.0 - 34.0 pg   MCHC 34.2 30.0 - 36.0 g/dL   RDW 13.1 11.5 - 15.5 %   Platelets 140 (L) 150 - 400 K/uL   Neutrophils Relative % 69 43 - 77 %   Neutro Abs 6.0 1.7 - 7.7 K/uL   Lymphocytes Relative 19 12 - 46 %   Lymphs Abs 1.6 0.7 - 4.0 K/uL   Monocytes Relative 10 3 - 12 %   Monocytes Absolute 0.9 0.1 - 1.0 K/uL   Eosinophils Relative 1 0 - 5 %   Eosinophils Absolute 0.1 0.0 - 0.7 K/uL   Basophils Relative 1 0 - 1 %   Basophils Absolute 0.1 0.0 - 0.1 K/uL  HEENT: normal Cardio: RRR and no murmur Resp: CTA B/L and unlabored GI: BS positive and NT, ND Extremity:  Edema LLE Skin:   Bruise Left periorbital Neuro: Alert/Oriented, Anxious and Abnormal Motor 5/5 in BUE, 4/5 RLE, 2- Left HF, KE, 3- ankle Musc/Skel:  Normal and Swelling Left hip and leg, mild, non tender Gen NAD   Assessment/Plan: 1. Functional deficits secondary to Left superior pubic ramus fx s/p fall 09/14/14 which require 3+ hours per day of interdisciplinary therapy in a comprehensive inpatient rehab setting. Physiatrist is providing close team supervision and 24 hour management of active medical problems listed below. Physiatrist and rehab team continue to assess barriers to discharge/monitor patient progress toward functional and medical goals. FIM: FIM - Bathing Bathing Steps Patient Completed: Chest, Right Arm, Left Arm, Abdomen, Right upper leg, Left upper leg, Right lower leg (including foot) Bathing: 3: Mod-Patient completes 5-7 36f10 parts or 50-74%  FIM - Upper Body Dressing/Undressing Upper body dressing/undressing steps patient  completed: Thread/unthread right bra strap, Thread/unthread left bra strap, Thread/unthread right sleeve of pullover shirt/dresss, Thread/unthread left sleeve of pullover shirt/dress, Put head through opening of pull over shirt/dress, Pull shirt over trunk Upper body dressing/undressing: 5: Set-up assist to: Obtain clothing/put away FIM - Lower Body Dressing/Undressing Lower body dressing/undressing steps patient completed: Thread/unthread right pants leg, Thread/unthread left pants leg, Don/Doff right sock Lower body dressing/undressing: 2: Max-Patient completed 25-49% of tasks  FIM - Toileting Toileting: 1: Total-Patient completed zero steps, helper did all 3  FIM - TRadio producerDevices: WEnvironmental consultant GProduct managerTransfers: 3-To toilet/BSC: Mod A (lift or lower assist), 3-From toilet/BSC: Mod A (lift or lower assist)  FIM - Bed/Chair Transfer Bed/Chair Transfer: 3: Supine > Sit: Mod A (lifting assist/Pt. 50-74%/lift 2 legs, 3: Chair or W/C > Bed: Mod A (lift or lower assist), 3: Bed > Chair or W/C: Mod A (lift or lower assist)  FIM - Locomotion: Wheelchair Distance: 150 Locomotion: Wheelchair: 5: Travels 150 ft or more: maneuvers on rugs and over door sills with supervision, cueing or coaxing FIM - Locomotion: Ambulation Locomotion: Ambulation Assistive Devices: WAdministratorAmbulation/Gait Assistance: 4: Min assist Locomotion: Ambulation: 1: Travels less than 50 ft with minimal assistance (Pt.>75%)  Comprehension Comprehension Mode: Auditory Comprehension: 6-Follows complex conversation/direction: With extra time/assistive device  Expression Expression Mode: Verbal Expression: 5-Expresses complex 90% of the time/cues < 10% of the time  Social Interaction Social Interaction: 5-Interacts appropriately 90% of the time - Needs monitoring or encouragement for participation or interaction.  Problem Solving Problem Solving: 5-Solves complex 90% of the  time/cues < 10% of the time  Memory Memory: 5-Recognizes or recalls 90% of the time/requires cueing < 10% of the time  Medical Problem List and Plan: 1. Functional deficits secondary to Left superior and inferior pubic rami fracture with extraperitoneal hematoma and severe left groin pain with movement.   2.  DVT Prophylaxis/Anticoagulation: Pharmaceutical: Pradexa 3. Pain Management: Vicodin effective in pain management.  chest contusion, no rib fx on Xray, trial lidoderm patch 4. Mood: LCSW to follow for evaluation and support.   5. Neuropsych: This patient is capable of making decisions on her own behalf. 6. Skin/Wound Care: Routine pressure relief measures. Monitor hematoma left eyebrow.   7. Fluids/Electrolytes/Nutrition: Monitor I/O. Offer supplements prn poor intake. Check lytes in am.   8.  Extraperitoneal hematoma: Monitor H/H with serial labs    9.  PAF: Will monitor HR tid and watch for recurrent tachycardia. Continue  Tikosyn and metoprolol current dose. Multiple interactions with tikosyn limit SSRI, anti emetic use  10. COPD?: Reports h/o bronchitis and allergies--denies any respiratory issues.    11. HTN: Monitor with tid checks. Continue Metoprolol.  12. Hyperkalemia: Likely due to supplement--dc today. Will recheck labs in am.   13. Leucocytosis: Check UA/UCS due to strong urine odor UA neg   14. Chronic anorexia: will add nutritional supplements. Cont  Carafate, add pepcid   LOS (Days) 2 A FACE TO FACE EVALUATION WAS PERFORMED  Kyl Givler E 09/18/2014, 7:16 AM

## 2014-09-18 NOTE — Progress Notes (Signed)
Occupational Therapy Session Note  Patient Details  Name: [name] MRN: [id number] Date of Birth: 1925/10/07  Today's Date: 09/18/2014 OT Individual Time: 1100-1200 OT Individual Time Calculation (min): 60 min    Short Term Goals: Week 1:  OT Short Term Goal 1 (Week 1): Patient will complete toilet transfers with min assist. OT Short Term Goal 2 (Week 1): Patient will complete LB dressing using AE with min assist. OT Short Term Goal 3 (Week 1): Patient will complete toileting with min assist. OT Short Term Goal 4 (Week 1): Patient will complete toilet transfers via stand step ambulation with RW with min assist. OT Short Term Goal 5 (Week 1): Patient will complete shower transfers via stand step ambulation with RW with min assist.  Skilled Therapeutic Interventions/Progress Updates: Patient completed ADL in room shower with c/os of great pain and required extra time for all movements, transfers and sit to stand due to complaints of great pain.  She'd had pain meds 1 hour prior to this session.  Pain seems to be biggest inhibiting factor.     Therapy Documentation Precautions:  Precautions Precautions: Fall Precaution Comments: Fall Restrictions Weight Bearing Restrictions: No LLE Weight Bearing: Weight bearing as tolerated  Pain:8/10 pelvis.   RN gave meds 1 hour before this session   See FIM for current functional status  Therapy/Group: Individual Therapy  Alfredia Ferguson [facility] 09/18/2014, 3:56 PM

## 2014-09-18 NOTE — IPOC Note (Signed)
Overall Plan of Care [facility]) Patient Details Name: [name] MRN: [id number] DOB: 12/19/1925  Admitting Diagnosis: pubic rami fx after fall  Hospital Problems: Principal Problem:   Fracture of left superior pubic ramus Active Problems:   PAF (paroxysmal atrial fibrillation)   Chest pain, musculoskeletal     Functional Problem List: Nursing Endurance, Edema, Pain, Safety, Skin Integrity  PT Endurance, Pain, Safety  OT Balance, Edema, Endurance, Pain, Motor  SLP    TR         Basic ADL's: OT Bathing, Dressing, Toileting     Advanced  ADL's: OT Simple Meal Preparation     Transfers: PT Bed Mobility, Bed to Chair, Car, Furniture, Floor  OT Toilet, Metallurgist: PT Ambulation, Emergency planning/management officer, Stairs     Additional Impairments: OT    SLP        TR      Anticipated Outcomes Item Anticipated Outcome  Self Feeding Independent  Swallowing      Basic self-care  Modified Independent  Toileting  Modified Independent   Bathroom Transfers Supervision  Bowel/Bladder  Patient will be independently continent of bowel and bladder   Transfers  mod (I)  Locomotion  mod (I) for w/c propulsion, supervision for household ambulation  Communication     Cognition     Pain  Pain will be equal to or less than 4 on a scale of 0-10  Safety/Judgment  Patient will be free from falls/injury with min assist   Therapy Plan: PT Intensity: Minimum of 1-2 x/day ,45 to 90 minutes PT Frequency: 5 out of 7 days PT Duration Estimated Length of Stay: 7-10 days OT Intensity: Minimum of 1-2 x/day, 45 to 90 minutes OT Duration/Estimated Length of Stay: 10-14 days         Team Interventions: Nursing Interventions Patient/Family Education, Disease Management/Prevention, Pain Management, Medication Management, Discharge Planning  PT interventions Ambulation/gait training, Community reintegration, Discharge planning, DME/adaptive equipment instruction, Functional  mobility training, Patient/family education, Pain management, Psychosocial support, Stair training, Therapeutic Activities, Therapeutic Exercise, UE/LE Strength taining/ROM, Wheelchair propulsion/positioning  OT Interventions Training and development officer, Discharge planning, DME/adaptive equipment instruction, Functional mobility training, Patient/family education, Pain management, Psychosocial support, Self Care/advanced ADL retraining, Therapeutic Exercise, UE/LE Coordination activities, UE/LE Strength taining/ROM, Therapeutic Activities  SLP Interventions    TR Interventions    SW/CM Interventions      Team Discharge Planning: Destination: PT-Home ,OT- Home , SLP-  Projected Follow-up: PT-Home health PT, OT-  Outpatient OT, SLP-  Projected Equipment Needs: PT-To be determined, OT- 3 in 1 bedside comode, SLP-  Equipment Details: PT- , OT-  Patient/family involved in discharge planning: PT- Patient,  OT-Patient, SLP-   MD ELOS: 9-11 days Medical Rehab Prognosis:  Excellent Assessment: The patient has been admitted for CIR therapies with the diagnosis of left pubic ramus fx. The team will be addressing functional mobility, strength, stamina, balance, safety, adaptive techniques and equipment, self-care, bowel and bladder mgt, patient and caregiver education, pain mgt, activity tolerance, community reintegration, ego support. Goals have been set at mod I for mobility and self-care.    Meredith Staggers, MD, FAAPMR      See Team Conference Notes for weekly updates to the plan of care

## 2014-09-19 ENCOUNTER — Inpatient Hospital Stay (HOSPITAL_COMMUNITY): Payer: Medicare Other

## 2014-09-19 ENCOUNTER — Inpatient Hospital Stay (HOSPITAL_COMMUNITY): Payer: Medicare Other | Admitting: *Deleted
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
## 2014-09-19 NOTE — Progress Notes (Signed)
81 y.o. female with history of A Fib--on Pradaxa, h/o melanoma R-eye, PPM who lost her balance and fell onto her left hip and struck her left eye on 09/14/14.  Patient reported that last 3 weeks have been busy and hectic due to multiple appts to get her husband ready for AVR in the future.  Patient with subsequent complains of left groin pain, chest pain and back pain.  She was noted to have left forehead contusion and X rays of left hip revealing mildly displaced left inferior and left superior pubic rami fracture with extraperitoneal pelvic hematoma and stable 3 cm infrarenal AAA. She was evaluated by Dr. Lyla Glassing who recommended WBAT with walker  Subjective/Complaints: Pain on left hip. lidoderm helped chest Also has problems with reflux at home, takes zantac and carafate  ROS- no further choking on pills Objective: Vital Signs: Blood pressure 140/65, pulse 72, temperature 97.6 F (36.4 C), temperature source Oral, resp. rate 17, height _0  (1.575 m), weight 70.6 kg (155 lb 10.3 oz), SpO2 98 %. No results found. Results for orders placed or performed during the hospital encounter of 09/16/14 (from the past 72 hour(s))  Comprehensive metabolic panel     Status: Abnormal   Collection Time: 09/17/14  6:49 AM  Result Value Ref Range   Sodium 135 135 - 145 mmol/L   Potassium 4.2 3.5 - 5.1 mmol/L   Chloride 105 101 - 111 mmol/L   CO2 21 (L) 22 - 32 mmol/L   Glucose, Bld 101 (H) 70 - 99 mg/dL   BUN 11 6 - 20 mg/dL   Creatinine, Ser 0.79 0.44 - 1.00 mg/dL   Calcium 8.5 (L) 8.9 - 10.3 mg/dL   Total Protein 5.4 (L) 6.5 - 8.1 g/dL   Albumin 3.0 (L) 3.5 - 5.0 g/dL   AST 31 15 - 41 U/L   ALT 21 14 - 54 U/L   Alkaline Phosphatase 70 38 - 126 U/L   Total Bilirubin 1.3 (H) 0.3 - 1.2 mg/dL   GFR calc non Af Amer >60 >60 mL/min   GFR calc Af Amer >60 >60 mL/min    Comment: (NOTE) The eGFR has been calculated using the CKD EPI equation. This calculation has not been validated in all clinical  situations. eGFR's persistently <60 mL/min signify possible Chronic Kidney Disease.    Anion gap 9 5 - 15  CBC WITH DIFFERENTIAL     Status: Abnormal   Collection Time: 09/17/14  6:49 AM  Result Value Ref Range   WBC 8.6 4.0 - 10.5 K/uL   RBC 4.54 3.87 - 5.11 MIL/uL   Hemoglobin 14.8 12.0 - 15.0 g/dL   HCT 43.3 36.0 - 46.0 %   MCV 95.4 78.0 - 100.0 fL   MCH 32.6 26.0 - 34.0 pg   MCHC 34.2 30.0 - 36.0 g/dL   RDW 13.1 11.5 - 15.5 %   Platelets 140 (L) 150 - 400 K/uL   Neutrophils Relative % 69 43 - 77 %   Neutro Abs 6.0 1.7 - 7.7 K/uL   Lymphocytes Relative 19 12 - 46 %   Lymphs Abs 1.6 0.7 - 4.0 K/uL   Monocytes Relative 10 3 - 12 %   Monocytes Absolute 0.9 0.1 - 1.0 K/uL   Eosinophils Relative 1 0 - 5 %   Eosinophils Absolute 0.1 0.0 - 0.7 K/uL   Basophils Relative 1 0 - 1 %   Basophils Absolute 0.1 0.0 - 0.1 K/uL     HEENT: normal  Cardio: RRR and no murmur Resp: CTA B/L and unlabored GI: BS positive and NT, ND Extremity:  Edema LLE Skin:   Bruise Left periorbital and left hip/flank. No bruising on chest. Neuro: Alert/Oriented, Anxious and Abnormal Motor 5/5 in BUE, 4/5 RLE, 2- Left HF, KE, 3- ankle Musc/Skel:  Normal and Swelling Left hip and leg, mild, non tender. Left chest wall still tender. Gen NAD   Assessment/Plan: 1. Functional deficits secondary to Left superior pubic ramus fx s/p fall 09/14/14 which require 3+ hours per day of interdisciplinary therapy in a comprehensive inpatient rehab setting. Physiatrist is providing close team supervision and 24 hour management of active medical problems listed below. Physiatrist and rehab team continue to assess barriers to discharge/monitor patient progress toward functional and medical goals. FIM: FIM - Bathing Bathing Steps Patient Completed: Chest, Right Arm, Left Arm, Abdomen, Front perineal area, Buttocks, Right upper leg, Left upper leg Bathing: 4: Min-Patient completes 8-9 [id number] parts or 75+ percent  FIM - Upper  Body Dressing/Undressing Upper body dressing/undressing steps patient completed: Thread/unthread right bra strap, Hook/unhook bra, Thread/unthread left bra strap, Thread/unthread right sleeve of pullover shirt/dresss, Put head through opening of pull over shirt/dress, Thread/unthread left sleeve of pullover shirt/dress, Pull shirt over trunk Upper body dressing/undressing: 5: Set-up assist to: Obtain clothing/put away FIM - Lower Body Dressing/Undressing Lower body dressing/undressing steps patient completed: Thread/unthread right underwear leg, Thread/unthread left underwear leg, Thread/unthread left pants leg, Pull pants up/down, Fasten/unfasten pants, Thread/unthread right pants leg Lower body dressing/undressing: 3: Mod-Patient completed 50-74% of tasks  FIM - Toileting Toileting: 0: Activity did not occur  FIM - TRadio producerDevices: WEnvironmental consultant GProduct managerTransfers: 0-Activity did not occur  FIM - BIT sales professionalTransfer: 2: Supine > Sit: Max A (lifting assist/Pt. 25-49%), 3: Bed > Chair or W/C: Mod A (lift or lower assist)  FIM - Locomotion: Wheelchair Distance: 150 Locomotion: Wheelchair: 5: Travels 150 ft or more: maneuvers on rugs and over door sills with supervision, cueing or coaxing FIM - Locomotion: Ambulation Locomotion: Ambulation Assistive Devices: WAdministratorAmbulation/Gait Assistance: 4: Min assist Locomotion: Ambulation: 1: Travels less than 50 ft with minimal assistance (Pt.>75%)  Comprehension Comprehension Mode: Auditory Comprehension: 6-Follows complex conversation/direction: With extra time/assistive device  Expression Expression Mode: Verbal Expression: 5-Expresses complex 90% of the time/cues < 10% of the time  Social Interaction Social Interaction: 5-Interacts appropriately 90% of the time - Needs monitoring or encouragement for participation or interaction.  Problem Solving Problem Solving: 5-Solves  complex 90% of the time/cues < 10% of the time  Memory Memory: 5-Recognizes or recalls 90% of the time/requires cueing < 10% of the time  Medical Problem List and Plan: 1. Functional deficits secondary to Left superior and inferior pubic rami fracture with extraperitoneal hematoma and severe left groin pain with movement.   2.  DVT Prophylaxis/Anticoagulation: Pharmaceutical: Pradexa 3. Pain Management: Vicodin effective in pain management.  chest contusion, no rib fx on Xray, lidoderm helpful  -ice to chest/hip as needed 4. Mood: LCSW to follow for evaluation and support.   5. Neuropsych: This patient is capable of making decisions on her own behalf. 6. Skin/Wound Care: Routine pressure relief measures. Monitor hematoma left eyebrow.   7. Fluids/Electrolytes/Nutrition: Monitor I/O. Offer supplements prn poor intake. Check lytes    8.  Extraperitoneal hematoma: Monitor H/H with serial labs    9.  PAF: Will monitor HR tid and watch for recurrent tachycardia. Continue Tikosyn and metoprolol current  dose. Multiple interactions with tikosyn limit SSRI, anti emetic use  10. COPD?: Reports h/o bronchitis and allergies--denies any respiratory issues.    11. HTN: Monitor with tid checks. Continue Metoprolol.  12. Hyperkalemia:  Check labs   13. Leucocytosis: ua neg, cx pending  14. Chronic anorexia: will add nutritional supplements. Cont  Carafate, added pepcid   LOS (Days) 3 A FACE TO FACE EVALUATION WAS PERFORMED  SWARTZ,ZACHARY T 09/19/2014, 7:13 AM

## 2014-09-19 NOTE — Progress Notes (Signed)
Physical Therapy Session Note  Patient Details  Name: [name] MRN: [id number] Date of Birth: 1926/04/07  Today's Date: 09/19/2014 PT Individual Time: 1100-1200 PT Individual Time Calculation (min): 60 min   Session 2 Time: 8828-0034 Time Calculation (min): 45 min  Short Term Goals: Week 1:  PT Short Term Goal 1 (Week 1): Pt will sit>stand from standard furniture w/ MinGuard A PT Short Term Goal 2 (Week 1): Pt will propel w/c 150' w/ BUE and mod (I) PT Short Term Goal 3 (Week 1): Pt will ambulate 68' w/ MinGuard A  Skilled Therapeutic Interventions/Progress Updates:    Session 1: Session focused on w/c fitting, functional endurance, bed mobility. Instructed pt in propelling w/c to/from rehab gym 150' w/ SBA. Instructed pt in w/c parts management for brakes and leg rests w/ MinA. Instructed pt in Kinetron UBE 6' at L1 for UE endurance. Switched pt to 18x16 w/c w/ solid insert to assist with pelvic positioning and maintain neutral hip alignment in sitting. Instructed pt in ambulating 10'x2 w/ RW w/ instruction in step to gait w/ R and manual facilitation to reduce pelvic rotation in gait. Pt continues to have significant pain with WBing through LLE limiting ambulation tolerance and distance. Pt tolerated session well, but fatigued at end of session. Session ended in pt's room, where pt was left seated in w/c w/ all needs within reach.   Session 2: Session focused on standing tolerance, toileting. Pt propelled w/c to rehab gym w/ SBA and managed parts w/ min cueing. While performing stand pivot transfer w/c>mat table, pt reported feeling significantly ill, like something was "weird" and "off." Set up w/c behind pt to allow her to sit and rest. Vitals WNL, RN in to assess pt as well. After sitting pt reported feeling better, but reported urgent need for bathroom. Transported pt to handicapped accessible bathroom. W/c<> toilet transfer w/ MinA for sit>stand and MinGuard during pivot, assist for  toileting as documented in FIM. Pt reported significant fatigue after toileting and asked to return to room. Upon return to room, pt had another episode of feeling "weird" with significant lightheadedness. RN immediately in to assess pt and notified PA of symptoms. Pt transferred back to bed w/ MinA and moved sit>supine w/ ModA to manage BLE into bed. Pt left supine in bed w/ all needs within reach.  Pt making slow progress with therapy, continues to be limited by significant pain and new medical issues today limiting participation. Pt will benefit from continued therapy at inpatient rehab level.   Therapy Documentation Precautions:  Precautions Precautions: Fall Precaution Comments: Fall Restrictions Weight Bearing Restrictions: No LLE Weight Bearing: Weight bearing as tolerated General:   Vital Signs: Therapy Vitals Temp: 97.6 F (36.4 C) Temp Source: Oral Pulse Rate: 72 Resp: 17 BP: 140/65 mmHg Patient Position (if appropriate): Lying Oxygen Therapy SpO2: 98 % Pain: Pain Assessment Pain Assessment: 0-10 Pain Score: 6 Pain Type: Acute pain Pain Location: Hip Pain Orientation: Left Pain Descriptors / Indicators: Aching Pain Frequency: Constant Pain Onset: On-going Pain Intervention(s): Pt received medication from RN prior to session  See FIM for current functional status  Therapy/Group: Individual Therapy  Rada Hay 09/19/2014, 7:11 AM
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
## 2014-09-19 NOTE — Discharge Instructions (Addendum)
Inpatient Rehab Discharge Instructions  [name] Discharge date and time: 09/29/14   Activities/Precautions/ Functional Status: Activity: activity as tolerated Diet: Heart Healthy Wound Care: none needed Functional status:  ___ No restrictions     ___ Walk up steps independently ___ 24/7 supervision/assistance   ___ Walk up steps with assistance _X__ Intermittent supervision/assistance  _X__ Bathe/dress independently _X__ Walk with walker    ___ Bathe/dress with assistance ___ Walk Independently    ___ Shower independently ___ Walk with assistance    ___ Shower with assistance _X__ No alcohol     ___ Return to work/school ________   COMMUNITY REFERRALS UPON DISCHARGE:    Home Health:   PT        RN                     Agency: [facility] Phone: [phone]   Medical Equipment/Items Ordered: wheelchair, cushion, commode and tub bench                                                      Agency/Supplier: [facility] @ [phone]    Special Instructions: 1. Weight yourself daily. Monitor for swelling, shortness of breath or upward trend in weight.   My questions have been answered and I understand these instructions. I will adhere to these goals and the provided educational materials after my discharge from the hospital.  Patient/Caregiver Signature _______________________________ Date __________  Clinician Signature _______________________________________ Date __________  Please bring this form and your medication list with you to all your follow-up doctor's appointments.  Information on my medicine - Pradaxa (dabigatran)  This medication education was reviewed with me or my healthcare representative as part of my discharge preparation.    Why was Pradaxa prescribed for you? Pradaxa was prescribed for you to reduce the risk of forming blood clots that cause a stroke if you have a medical condition called atrial fibrillation (a type of irregular  heartbeat).    What do you Need to know about PradAXa? Take your Pradaxa TWICE DAILY - one capsule in the morning and one tablet in the evening with or without food.  It would be best to take the doses about the same time each day.  The capsules should not be broken, chewed or opened - they must be swallowed whole.  Do not store Pradaxa in other medication containers - once the bottle is opened the Pradaxa should be used within FOUR months; throw away any capsules that havent been by that time.  Take Pradaxa exactly as prescribed by your doctor.  DO NOT stop taking Pradaxa without talking to the doctor who prescribed the medication.  Stopping without other stroke prevention medication to take the place of Pradaxa may increase your risk of developing a clot that causes a stroke.  Refill your prescription before you run out.  After discharge, you should have regular check-up appointments with your healthcare provider that is prescribing your Pradaxa.  In the future your dose may need to be changed if your kidney function or weight changes by a significant amount.  What do you do if you miss a dose? If you miss a dose, take it as soon as you remember on the same day.  If your next dose is less than 6 hours away, skip the missed  dose.  Do not take two doses of PRADAXA at the same time.  Important Safety Information A possible side effect of Pradaxa is bleeding. You should call your healthcare provider right away if you experience any of the following: ? Bleeding from an injury or your nose that does not stop. ? Unusual colored urine (red or dark brown) or unusual colored stools (red or black). ? Unusual bruising for unknown reasons. ? A serious fall or if you hit your head (even if there is no bleeding).  Some medicines may interact with Pradaxa and might increase your risk of bleeding or clotting while on Pradaxa. To help avoid this, consult your healthcare provider or pharmacist prior  to using any new prescription or non-prescription medications, including herbals, vitamins, non-steroidal anti-inflammatory drugs (NSAIDs) and supplements.  This website has more information on Pradaxa (dabigatran): [email or web address].com
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
## 2014-09-19 NOTE — Progress Notes (Signed)
Occupational Therapy Session Note  Patient Details  Name: [name] MRN: [id number] Date of Birth: 03-30-1926  Today's Date: 09/19/2014 OT Individual Time: 0930-1030 OT Individual Time Calculation (min): 60 min    Short Term Goals: Week 1:  OT Short Term Goal 1 (Week 1): Patient will complete toilet transfers with min assist. OT Short Term Goal 2 (Week 1): Patient will complete LB dressing using AE with min assist. OT Short Term Goal 3 (Week 1): Patient will complete toileting with min assist. OT Short Term Goal 4 (Week 1): Patient will complete toilet transfers via stand step ambulation with RW with min assist. OT Short Term Goal 5 (Week 1): Patient will complete shower transfers via stand step ambulation with RW with min assist.  Skilled Therapeutic Interventions/Progress Updates: ADL-retraining with focus on AE training (reacher, sock aid, leg lifter), lower body dressing, transfers (w/c<>bed in ADL apartment).   Pt declined bathing/dressing d/t prior assist with task but reported awareness of impaired performance with ADL at LLE.  OT provided education to include demo of use of reacher and sock aid; pt returned demonstration with good thoroughness and retained equipment.   Pt was then escorted to ADL apt and completed transfer to bed to include use of leg lifter to bring left LE into bed with overall min assist and extra time.   Pt unable to ambulate during this session and acknowledged need for w/c for mobility d/t severe LLE pain.   Pt escorted back to her room and remained seated in w/c with all needs within reach.     Therapy Documentation Precautions:  Precautions Precautions: Fall Precaution Comments: Fall Restrictions Weight Bearing Restrictions: No LLE Weight Bearing: Weight bearing as tolerated  Pain: 7/10, left hip during bed mobility; repositioned for comfort    See FIM for current functional status  Therapy/Group: Individual Therapy  [place] 09/19/2014, 3:12  PM

## 2014-09-19 NOTE — Care Management Note (Signed)
[facility] Individual Statement of Services  Patient Name:  [name]  Date:  09/19/2014  [facility] to [facility].  Our goal is to provide you with an individualized program based on your diagnosis and situation, designed to meet your specific needs.  With this comprehensive rehabilitation program, you will be expected to participate in at least 3 hours of rehabilitation therapies Monday-Friday, with modified therapy programming on the weekends.  Your rehabilitation program will include the following services:  Physical Therapy (PT), Occupational Therapy (OT), 24 hour per day rehabilitation nursing, Therapeutic Recreaction (TR), Case Management (Social Worker), Rehabilitation Medicine, Nutrition Services and Pharmacy Services  Weekly team conferences will be held on Tuesdays to discuss your progress.  Your Social Worker will talk with you frequently to get your input and to update you on team discussions.  Team conferences with you and your family in attendance may also be held.  Expected length of stay: 10 days  Overall anticipated outcome: supervision/ modified independent  Depending on your progress and recovery, your program may change. Your Social Worker will coordinate services and will keep you informed of any changes. Your Social Worker's name and contact numbers are listed  below.  The following services may also be recommended but are not provided by the [facility] will be made to provide these services after discharge if needed.  Arrangements include referral to agencies that provide these services.  Your insurance has been verified to be:  Medicare and AARP (plus a long term care policy) Your primary doctor is:  Dr. Ashby Dawes  Pertinent information will be shared with your doctor and your  insurance company.  Social Worker:  [place], [place] or ([phone]   Information discussed with and copy given to patient by: Lennart Pall, 09/19/2014, 11:23 AM

## 2014-09-20 ENCOUNTER — Encounter: Payer: Medicare Other | Admitting: *Deleted

## 2014-09-20 ENCOUNTER — Inpatient Hospital Stay (HOSPITAL_COMMUNITY): Payer: Medicare Other

## 2014-09-20 ENCOUNTER — Inpatient Hospital Stay (HOSPITAL_COMMUNITY): Payer: Medicare Other | Admitting: Occupational Therapy

## 2014-09-20 ENCOUNTER — Telehealth: Payer: Self-pay | Admitting: Cardiology

## 2014-09-20 ENCOUNTER — Ambulatory Visit: Payer: Medicare Other

## 2014-09-20 MED ORDER — DICLOFENAC SODIUM 1 % TD GEL
2.0000 g | Freq: Three times a day (TID) | TRANSDERMAL | Status: DC
  Administered 2014-09-20 – 2014-09-29 (×23): 2 g via TOPICAL
  Filled 2014-09-20: qty 100

## 2014-09-20 NOTE — Progress Notes (Signed)
Therapist reported that patient stated she was having A Fib while in the gym, patient's BP, respirations and O2 sats were WNL, HR was elevated to 98, however it quickly returned to the mid 80's when the episode ended.  Patient stated that she was not in any discomfort but was always aware when she had an episode.

## 2014-09-20 NOTE — Patient Care Conference (Signed)
Inpatient RehabilitationTeam Conference and Plan of Care Update Date: 09/20/2014   Time: 2:10 PM    Patient Name: [name]      Medical Record Number: [id number]  Date of Birth: 06-04-1925 Sex: Female         Room/Bed: 4M04C/4M04C-01 Payor Info: Payor: MEDICARE / Plan: MEDICARE PART A AND B / Product Type: *No Product type* /    Admitting Diagnosis: pubic rami fx after fall  Admit Date/Time:  09/16/2014  3:23 PM Admission Comments: No comment available   Primary Diagnosis:  Fracture of left superior pubic ramus Principal Problem: Fracture of left superior pubic ramus  Patient Active Problem List   Diagnosis Date Noted  . Chest pain, musculoskeletal 09/16/2014  . Pelvic fracture 09/16/2014  . Fracture of left superior pubic ramus 09/14/2014  . Contusion of forehead 09/14/2014  . Calculus of bile duct without mention of cholecystitis or obstruction 06/23/2013  . Pacemaker 12/07/2012  . Hyperlipidemia 12/07/2012  . Cerebral embolism with cerebral infarction 01/22/2012  . Chest pain of pericarditis 01/20/2012  . Acute CHF, presumed secondary to Rt heart failure 01/20/2012  . Near syncope 01/17/2012  . Cardiac tamponade, recurrent episode, admitted 9/5, but now felt to be pericarditits 01/17/2012  . PAF (paroxysmal atrial fibrillation) 01/05/2012  . CAD, mild by cath 2001 and 09/04/11 09/05/2011  . COPD, by CXR, followed by Dr Annamaria Boots 09/05/2011  . Syncope, after NTG X 1 on admission 09/05/2011  . Chest pain 09/03/2011  . Supra theraputic INR 5.5 01/16/12 09/03/2011  . HTN (hypertension) 09/03/2011  . Nl coronaries 2001, low risk Myoview 2012 09/03/2011  . Dyspnea on exertion, secondary to AF 09/03/2011  . RBBB, intermittent 09/03/2011  . Personal history of colonic polyps 06/21/2011  . Right bundle branch block and left anterior fascicular block 03/19/2011  . Allergic rhinitis due to pollen 07/17/2010  . GERD 10/25/2009  . ABDOMINAL PAIN-EPIGASTRIC 10/25/2009  . 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 EUSTACHIAN TUBE DYSFUNCTION 11/26/2007  . Persistent atrial fibrillation, failed DCCV 11/12, turned down for RFA 11/26/2007  . DIVERTICULOSIS, COLON 07/03/2006    Expected Discharge Date: Expected Discharge Date: 09/29/14  Team Members Present: Physician leading conference: Dr. Alger Simons Social Worker Present: Lennart Pall, LCSW Nurse Present: Heather Roberts, RN PT Present: Canary Brim, Lorriane Shire, PT OT Present: Salome Spotted, OT;Other (comment) Napoleon Form, OT) SLP Present: Weston Anna, SLP Other (Discipline and Name): Danne Baxter, RN [facility]) [place] Coordinator present : Daiva Nakayama, RN, CRRN     Current Status/Progress Goal Weekly Team Focus  Medical   left pelvic fx after fall, knee pain bilaterally, afib with ?RVR  improve activity tolerance, pain control  ?rate control, symptom mgt   Bowel/Bladder   continent of bowel and bladder  remain continent of bowel and bladder  educate patient about the signs and symptoms of consipation   Swallow/Nutrition/ Hydration             ADL's   Overall min A for transfers and B & D  Mod I for B & D, supervision for transfers  Pain managmement, AE training, activity tolerance/endurance, functional transfers   Mobility   Mod for bed mobility, Min-Mod for sit>stand and Min in standing and for ambulation up to 10' w/ RW. Significantly limited by pain with any WBing through L  overall mod (I) from w/c level and supervision for upright mobility  WBing tolerance, pain management, mobility strategies to reduce pain   Communication  Safety/Cognition/ Behavioral Observations            Pain   patient reports pain in the left pelvic area and bilateral knees  pain less than or equal to 4 on a scale of 0-10  assess for pain q4h and medicate as indicated, apply voltaren gel to bilateral knees per schedule   Skin   bruises to left forhead, around left eye, lower abdomen, left pelvis area and left thigh.   avoid any new skin injury/breakdown  assess skin q shif and prn.    Rehab Goals Patient on target to meet rehab goals: Yes *See Care Plan and progress notes for long and short-term goals.  Barriers to Discharge: pain, ?tachycardia    Possible Resolutions to Barriers:  pain control, mobility mod/equipment, ?adjusment to cardiac meds    Discharge Planning/Teaching Needs:  home with husband who can only provide supervision;  son also available to assist if needed      Team Discussion:  Working on pain control;  Experiencing afib today with SOB - pt very aware when this happens.  Pain is limiting what she can do in tx and need to focus on w/c level goals with stand-pivot transfers .  Hope for mod i w/c level.  Family will need to build ramp  Revisions to Treatment Plan:  None   Continued Need for Acute Rehabilitation Level of Care: The patient requires daily medical management by a physician with specialized training in physical medicine and rehabilitation for the following conditions: Daily direction of a multidisciplinary physical rehabilitation program to ensure safe treatment while eliciting the highest outcome that is of practical value to the patient.: Yes Daily medical management of patient stability for increased activity during participation in an intensive rehabilitation regime.: Yes Daily analysis of laboratory values and/or radiology reports with any subsequent need for medication adjustment of medical intervention for : Post surgical problems;Neurological problems  Ulyssa Walthour 09/20/2014, 6:06 PM

## 2014-09-20 NOTE — Progress Notes (Signed)
Social Work Patient ID: [name], female   DOB: 01-Mar-1926, 81 y.o.   MRN: [id number]   Lowella Curb, LCSW Social Worker Signed  Patient Care Conference 09/20/2014  3:53 PM    Expand All Collapse All   Inpatient RehabilitationTeam Conference and Plan of Care Update Date: 09/20/2014   Time: 2:10 PM     Patient Name: [name]       Medical Record Number: [id number]  Date of Birth: Aug 15, 1925 Sex: Female         Room/Bed: 4M04C/4M04C-01 Payor Info: Payor: MEDICARE / Plan: MEDICARE PART A AND B / Product Type: *No Product type* /    Admitting Diagnosis: pubic rami fx after fall   Admit Date/Time:  09/16/2014  3:23 PM Admission Comments: No comment available   Primary Diagnosis:  Fracture of left superior pubic ramus Principal Problem: Fracture of left superior pubic ramus    Patient Active Problem List     Diagnosis  Date Noted   .  Chest pain, musculoskeletal  09/16/2014   .  Pelvic fracture  09/16/2014   .  Fracture of left superior pubic ramus  09/14/2014   .  Contusion of forehead  09/14/2014   .  Calculus of bile duct without mention of cholecystitis or obstruction  06/23/2013   .  Pacemaker  12/07/2012   .  Hyperlipidemia  12/07/2012   .  Cerebral embolism with cerebral infarction  01/22/2012   .  Chest pain of pericarditis  01/20/2012   .  Acute CHF, presumed secondary to Rt heart failure  01/20/2012   .  Near syncope  01/17/2012   .  Cardiac tamponade, recurrent episode, admitted 9/5, but now felt to be pericarditits  01/17/2012   .  PAF (paroxysmal atrial fibrillation)  01/05/2012   .  CAD, mild by cath 2001 and 09/04/11  09/05/2011   .  COPD, by CXR, followed by Dr Annamaria Boots  09/05/2011   .  Syncope, after NTG X 1 on admission  09/05/2011   .  Chest pain  09/03/2011   .  Supra theraputic INR 5.5 01/16/12  09/03/2011   .  HTN (hypertension)  09/03/2011   .  Nl coronaries 2001, low risk Myoview 2012  09/03/2011   .  Dyspnea on exertion, secondary to AF  09/03/2011   .   RBBB, intermittent  09/03/2011   .  Personal history of colonic polyps  06/21/2011   .  Right bundle branch block and left anterior fascicular block  03/19/2011   .  Allergic rhinitis due to pollen  07/17/2010   .  GERD  10/25/2009   .  ABDOMINAL PAIN-EPIGASTRIC  10/25/2009   .  Aneurysm of other visceral artery  03/30/2008   .  EUSTACHIAN TUBE DYSFUNCTION  11/26/2007   .  Persistent atrial fibrillation, failed DCCV 11/12, turned down for RFA  11/26/2007   .  DIVERTICULOSIS, COLON  07/03/2006     Expected Discharge Date: Expected Discharge Date: 09/29/14  Team Members Present: Physician leading conference: Dr. Alger Simons Social Worker Present: Lennart Pall, LCSW Nurse Present: Heather Roberts, RN PT Present: Canary Brim, Lorriane Shire, PT OT Present: Salome Spotted, OT;Other (comment) Napoleon Form, OT) SLP Present: Weston Anna, SLP Other (Discipline and Name): Danne Baxter, RN [facility]) [place] Coordinator present : Daiva Nakayama, RN, CRRN        Current Status/Progress  Goal  Weekly Team Focus   Medical     left pelvic fx after fall,  knee pain bilaterally, afib with ?RVR   improve activity tolerance, pain control   ?rate control, symptom mgt   Bowel/Bladder     continent of bowel and bladder  remain continent of bowel and bladder   educate patient about the signs and symptoms of consipation   Swallow/Nutrition/ Hydration               ADL's     Overall min A for transfers and B & D  Mod I for B & D, supervision for transfers   Pain managmement, AE training, activity tolerance/endurance, functional transfers   Mobility     Mod for bed mobility, Min-Mod for sit>stand and Min in standing and for ambulation up to 10' w/ RW. Significantly limited by pain with any WBing through L  overall mod (I) from w/c level and supervision for upright mobility  WBing tolerance, pain management, mobility strategies to reduce pain   Communication               Safety/Cognition/ Behavioral Observations               Pain     patient reports pain in the left pelvic area and bilateral knees  pain less than or equal to 4 on a scale of 0-10  assess for pain q4h and medicate as indicated, apply voltaren gel to bilateral knees per schedule   Skin     bruises to left forhead, around left eye, lower abdomen, left pelvis area and left thigh.  avoid any new skin injury/breakdown   assess skin q shif and prn.    Rehab Goals Patient on target to meet rehab goals: Yes *See Care Plan and progress notes for long and short-term goals.    Barriers to Discharge:  pain, ?tachycardia     Possible Resolutions to Barriers:   pain control, mobility mod/equipment, ?adjusment to cardiac meds     Discharge Planning/Teaching Needs:   home with husband who can only provide supervision;  son also available to assist if needed       Team Discussion:    Working on pain control;  Experiencing afib today with SOB - pt very aware when this happens.  Pain is limiting what she can do in tx and need to focus on w/c level goals with stand-pivot transfers .  Hope for mod i w/c level.  Family will need to build ramp   Revisions to Treatment Plan:    None    Continued Need for Acute Rehabilitation Level of Care: The patient requires daily medical management by a physician with specialized training in physical medicine and rehabilitation for the following conditions: Daily direction of a multidisciplinary physical rehabilitation program to ensure safe treatment while eliciting the highest outcome that is of practical value to the patient.: Yes Daily medical management of patient stability for increased activity during participation in an intensive rehabilitation regime.: Yes Daily analysis of laboratory values and/or radiology reports with any subsequent need for medication adjustment of medical intervention for : Post surgical problems;Neurological problems  Tristram Milian 09/20/2014, 6:06 PM

## 2014-09-20 NOTE — Progress Notes (Signed)
81 y.o. female with history of A Fib--on Pradaxa, h/o melanoma R-eye, PPM who lost her balance and fell onto her left hip and struck her left eye on 09/14/14.  Patient reported that last 3 weeks have been busy and hectic due to multiple appts to get her husband ready for AVR in the future.  Patient with subsequent complains of left groin pain, chest pain and back pain.  She was noted to have left forehead contusion and X rays of left hip revealing mildly displaced left inferior and left superior pubic rami fracture with extraperitoneal pelvic hematoma and stable 3 cm infrarenal AAA. She was evaluated by Dr. Lyla Glassing who recommended WBAT with walker  Subjective/Complaints: Both knees started acting up yesterday. Has had problems with these at home Also has problems with reflux at home, takes zantac and carafate  ROS- no further choking on pills Objective: Vital Signs: Blood pressure 112/79, pulse 99, temperature 97.6 F (36.4 C), temperature source Oral, resp. rate 18, height 5\' 2"  (1.575 m), weight 71.2 kg (156 lb 15.5 oz), SpO2 96 %. No results found. No results found for this or any previous visit (from the past 72 hour(s)).   HEENT: normal Cardio: RRR and no murmur Resp: CTA B/L and unlabored GI: BS positive and NT, ND Extremity:  Edema LLE Skin:   Bruise Left periorbital and left hip/flank. No bruising on chest. Neuro: Alert/Oriented, Anxious and Abnormal Motor 5/5 in BUE, 4/5 RLE, 2- Left HF, KE, 3- ankle Musc/Skel:  Normal and Swelling Left hip and leg, mild, non tender. Left chest wall still tender but improved  -mild knee tenderness and effusions, no warmth Gen NAD   Assessment/Plan: 1. Functional deficits secondary to Left superior pubic ramus fx s/p fall 09/14/14 which require 3+ hours per day of interdisciplinary therapy in a comprehensive inpatient rehab setting. Physiatrist is providing close team supervision and 24 hour management of active medical problems listed  below. Physiatrist and rehab team continue to assess barriers to discharge/monitor patient progress toward functional and medical goals. FIM: FIM - Bathing Bathing Steps Patient Completed: Chest, Right Arm, Left Arm, Abdomen, Front perineal area, Buttocks, Right upper leg, Left upper leg Bathing: 4: Min-Patient completes 8-9 60f 10 parts or 75+ percent  FIM - Upper Body Dressing/Undressing Upper body dressing/undressing steps patient completed: Thread/unthread right bra strap, Hook/unhook bra, Thread/unthread left bra strap, Thread/unthread right sleeve of pullover shirt/dresss, Put head through opening of pull over shirt/dress, Thread/unthread left sleeve of pullover shirt/dress, Pull shirt over trunk Upper body dressing/undressing: 5: Set-up assist to: Obtain clothing/put away FIM - Lower Body Dressing/Undressing Lower body dressing/undressing steps patient completed: Thread/unthread right underwear leg, Thread/unthread left underwear leg, Thread/unthread left pants leg, Pull pants up/down, Fasten/unfasten pants, Thread/unthread right pants leg Lower body dressing/undressing: 3: Mod-Patient completed 50-74% of tasks  FIM - Toileting Toileting steps completed by patient: Adjust clothing prior to toileting, Performs perineal hygiene Toileting: 3: Mod-Patient completed 2 of 3 steps  FIM - Radio producer Devices: Environmental consultant, Product manager Transfers: 4-To toilet/BSC: Min A (steadying Pt. > 75%), 4-From toilet/BSC: Min A (steadying Pt. > 75%)  FIM - Bed/Chair Transfer Bed/Chair Transfer: 4: Bed > Chair or W/C: Min A (steadying Pt. > 75%), 4: Chair or W/C > Bed: Min A (steadying Pt. > 75%)  FIM - Locomotion: Wheelchair Distance: 150 Locomotion: Wheelchair: 5: Travels 150 ft or more: maneuvers on rugs and over door sills with supervision, cueing or coaxing FIM - Locomotion: Ambulation Locomotion: Ambulation  Assistive Devices: Administrator Ambulation/Gait  Assistance: 4: Min assist Locomotion: Ambulation: 1: Travels less than 50 ft with minimal assistance (Pt.>75%)  Comprehension Comprehension Mode: Auditory Comprehension: 6-Follows complex conversation/direction: With extra time/assistive device  Expression Expression Mode: Verbal Expression: 5-Expresses complex 90% of the time/cues < 10% of the time  Social Interaction Social Interaction: 5-Interacts appropriately 90% of the time - Needs monitoring or encouragement for participation or interaction.  Problem Solving Problem Solving: 5-Solves complex 90% of the time/cues < 10% of the time  Memory Memory: 5-Recognizes or recalls 90% of the time/requires cueing < 10% of the time  Medical Problem List and Plan: 1. Functional deficits secondary to Left superior and inferior pubic rami fracture with extraperitoneal hematoma and severe left groin pain with movement.   2.  DVT Prophylaxis/Anticoagulation: Pharmaceutical: Pradexa 3. Pain Management: Vicodin effective in pain management.  chest contusion, no rib fx on Xray, lidoderm helpful  -ice to chest/hip as needed  -add voltaren gel to knees (has GERD) 4. Mood: LCSW to follow for evaluation and support.   5. Neuropsych: This patient is capable of making decisions on her own behalf. 6. Skin/Wound Care: Routine pressure relief measures. Monitor hematoma left eyebrow.   7. Fluids/Electrolytes/Nutrition: Monitor I/O. Offer supplements prn poor intake. Check lytes    8.  Extraperitoneal hematoma: Monitor H/H with serial labs    9.  PAF: Will monitor HR tid and watch for recurrent tachycardia. Continue Tikosyn and metoprolol current dose. Multiple interactions with tikosyn limit SSRI, anti emetic use  10. COPD?: Reports h/o bronchitis and allergies--denies any respiratory issues.    11. HTN: Monitor with tid checks. Continue Metoprolol.  12. Hyperkalemia:    13. Leucocytosis: ua neg, cx with multispecies, afebrile 14. Chronic anorexia: will  add nutritional supplements. Cont  Carafate, added pepcid   LOS (Days) 4 A FACE TO FACE EVALUATION WAS PERFORMED  Sim Choquette T 09/20/2014, 7:42 AM

## 2014-09-20 NOTE — Progress Notes (Signed)
Occupational Therapy Session Note  Patient Details  Name: [name] MRN: [id number] Date of Birth: 09/30/1925  Today's Date: 09/20/2014 OT Individual Time: 4098-1191 OT Individual Time Calculation (min): 60 min   Short Term Goals: Week 1:  OT Short Term Goal 1 (Week 1): Patient will complete toilet transfers with min assist. OT Short Term Goal 2 (Week 1): Patient will complete LB dressing using AE with min assist. OT Short Term Goal 3 (Week 1): Patient will complete toileting with min assist. OT Short Term Goal 4 (Week 1): Patient will complete toilet transfers via stand step ambulation with RW with min assist. OT Short Term Goal 5 (Week 1): Patient will complete shower transfers via stand step ambulation with RW with min assist.  Skilled Therapeutic Interventions/Progress Updates: ADL-retraining with focus on improved activity tolerance, pain management at LLE, adapted bathing/dressing, AE training.   Pt completed bed mobility using DME (HOB elevated, bedrails) with extra time and min vc for technique.   Pt transferred to w/c using RW with setup and min guard assist during stand pivot transfer.   After setup at sink, pt bathed at sink with min assist for thoroughness and deferred washing her feet with LH sponge provided but stated she would try using it during next bathing session, 09/21/14.   Pt dressed, sitting and standing as needed to pull up underwear and pants, with min assist for socks and shoes after education on use of elastic laces.   Pt required rest breaks 3 times during session d/t report of A-Fib flare-up which she monitored independently.   Pt reported flare-up of hemorrhoids confirmed by OT, but she declined intervention offered with recommended contact from RN.     Therapy Documentation Precautions:  Precautions Precautions: Fall Precaution Comments: Fall Restrictions Weight Bearing Restrictions: No LLE Weight Bearing: Weight bearing as tolerated   Vital Signs: Therapy  Vitals Pulse Rate: 96 BP: 120/77 mmHg   Pain: Pain Assessment Pain Assessment: 0-10 Pain Score: 2  Pain Type: Acute pain Pain Location: Hip Pain Orientation: Left Pain Descriptors / Indicators: Discomfort Pain Frequency: Occasional Pain Onset: With Activity Patients Stated Pain Goal: 3 Pain Intervention(s): Distraction  See FIM for current functional status  Therapy/Group: Individual Therapy   Second session: Time: 0930-1030 Time Calculation (min):  60 min  Pain Assessment: 3/10, left hip  Skilled Therapeutic Interventions: Therapeutic activity with focus on lower body dressing skills, bed transfers, bed mobility, improved management of LLE during HEP, and general endurance.   Pt re-educated on use of elastic laces and sock aid for improved independence with lower body dressing; she returned demonstration this date unassisted.    Pt continues to report difficulty with bed mobility and transfers as priority for treatment and agreed to re-ed and re-training using raised mat in ortho gym.   OT escorted pt to gym and demo'd posterior transfer method from sitting at edge of to supine in bed as beneficial using plastic bag to reduce friction with clothing at LLE.    Pt observed and was able to perform supine exercises to reinforce use of bag or similar materials to reduce friction and enable improved control of LLE while supine.   Pt performed hip abduction and adduction exercises using bag with min assist to lift bag at edges to facilitate leg glides.   Pt completed mobility and exited raised mat unassisted with extra time and setup to place w/c and RW.   Pt returned to her room in her w/c with all needs within  reach and visitor from her church present at end of session.   See FIM for current functional status  Therapy/Group: Individual Therapy  [name] 09/20/2014, 10:29 AM

## 2014-09-20 NOTE — Progress Notes (Signed)
Physical Therapy Session Note  Patient Details  Name: [name] MRN: [id number] Date of Birth: 02-25-1926  Today's Date: 09/20/2014 PT Individual Time: 1100-1155 PT Individual Time Calculation (min): 55 min   Short Term Goals: Week 1:  PT Short Term Goal 1 (Week 1): Pt will sit>stand from standard furniture w/ MinGuard A PT Short Term Goal 2 (Week 1): Pt will propel w/c 150' w/ BUE and mod (I) PT Short Term Goal 3 (Week 1): Pt will ambulate 59' w/ MinGuard A  Skilled Therapeutic Interventions/Progress Updates:    Session focused on bed mobility, stand pivot transfers. Instructed pt in multiple stand pivot transfers w/ overall MinGuard A w/ cueing for decreased step length and use of BUE on armrests to stand/sit. Instructed pt in multiple supine<>sit transfers w/ Min-ModA depending on fatigue, strategy of scooting hips back and rotating hips to bring legs on/off of bed while minimizing pelvic rotation. Pt very fatigued throughout session, reporting that she was short of breath today because her heart was out of rhythm. Therapist noted irregular heartbeat with manual pulse check, RN aware of pt's cardiac status. Pt allowed extended rest breaks between mobility tasks due to increased fatigue. Pt left supine in bed w/ all needs within reach.  Pt making slow progress with PT due to decreased endurance from multiple cardiac co-morbidities and continues to be limited by significant pain. Anticipate pt will discharge at wheelchair level due to significant pain with WBing on LLE. Discussed ramp construction with pt, she reports her family is planning to have ramp placed at home entrance. Will provide handout on ramp construction at next session.  Therapy Documentation Precautions:  Precautions Precautions: Fall Precaution Comments: Fall Restrictions Weight Bearing Restrictions: No LLE Weight Bearing: Weight bearing as tolerated General:   Vital Signs: Therapy Vitals Temp: 97.6 F (36.4  C) Temp Source: Oral Pulse Rate: 99 Resp: 18 BP: 112/79 mmHg Patient Position (if appropriate): Sitting Oxygen Therapy SpO2: 96 % O2 Device: Not Delivered Pain: Pain Assessment Pain Assessment: 0-10 Pain Score: 6  Pain Type: Acute pain Pain Location: Hip Pain Orientation: Left Pain Descriptors / Indicators: Aching Pain Frequency: Intermittent Pain Onset: On-going Patients Stated Pain Goal: 3 Pain Intervention(s): RN Made Aware, Repositioned  See FIM for current functional status  Therapy/Group: Individual Therapy  Rada Hay  Rada Hay, PT, DPT 09/20/2014, 7:31 AM

## 2014-09-20 NOTE — Progress Notes (Signed)
Occupational Therapy Session Note  Patient Details  Name: Akirah Storck MRN: [id number] Date of Birth: 29-Feb-1926  Today's Date: 09/20/2014 OT Individual Time: 1300-1330 OT Individual Time Calculation (min): 30 min    Short Term Goals: Week 1:  OT Short Term Goal 1 (Week 1): Patient will complete toilet transfers with min assist. OT Short Term Goal 2 (Week 1): Patient will complete LB dressing using AE with min assist. OT Short Term Goal 3 (Week 1): Patient will complete toileting with min assist. OT Short Term Goal 4 (Week 1): Patient will complete toilet transfers via stand step ambulation with RW with min assist. OT Short Term Goal 5 (Week 1): Patient will complete shower transfers via stand step ambulation with RW with min assist.  Skilled Therapeutic Interventions/Progress Updates:    Pt seen for OT session focusing on functional transfers. Pt in supine upon arrival, voicing fatigue, however, agreeable to tx. Pt transferred supine> EOB with min A and VCs for technique using leg lifter. Pt transferred EOB> w/c with min A via RW. Pt taken to ADL apartment in w/c with total A for energy conservation. In ADL apartment, pt ambulated ~5 ft into ADL apartment. Pt required significantly increased time and rest breaks to complete transfers, and used leg lifter to manage LEs with supervision and VCs for technique. Pt performed squat pivot transfer from shower with min-mod A due to fatigue an cues for sequencing. Pt returned to room at end of session, and transferred to bed with close supervision. Pt left in supine at end of session, all needs in reach.    Education provided regarding deep breathing techniques, energy conservation,use of leg lifter, DME, and d/c planning.    Therapy Documentation Precautions:  Precautions Precautions: Fall Precaution Comments: Fall Restrictions Weight Bearing Restrictions: No LLE Weight Bearing: Weight bearing as tolerated Pain: Pain Assessment Pain Score: 8   Pain Location: Hip Pain Orientation: Left Pain Descriptors / Indicators: Discomfort;Aching Pain Onset: With Activity Pain Intervention(s): Repositioned;Ambulation/increased activity  See FIM for current functional status  Therapy/Group: Individual Therapy  Lewis, Naveen Clardy C 09/20/2014, 2:03 PM

## 2014-09-20 NOTE — Progress Notes (Signed)
Patient information reviewed and entered into [facility] system by Ferdinando Lodge, RN, CRRN, PPS Coordinator.  Information including medical coding and functional independence measure will be reviewed and updated through discharge.     Per nursing patient was given "Data Collection Information Summary for Patients in Inpatient Rehabilitation Facilities with attached "Privacy Act Statement-Health Care Records" upon admission.  

## 2014-09-20 NOTE — Telephone Encounter (Signed)
Confirmed remote transmission w/ pt husband.   

## 2014-09-21 ENCOUNTER — Inpatient Hospital Stay (HOSPITAL_COMMUNITY): Payer: Medicare Other | Admitting: Physical Therapy

## 2014-09-21 ENCOUNTER — Inpatient Hospital Stay (HOSPITAL_COMMUNITY): Payer: Medicare Other | Admitting: Occupational Therapy

## 2014-09-21 NOTE — Progress Notes (Signed)
Physical Therapy Session Note  Patient Details  Name: [name] MRN: [id number] Date of Birth: 1925-07-31  Today's Date: 09/21/2014 PT Individual Time: 0900-1000 Treatment Session 2: [id number] PT Individual Time Calculation (min): 60 min Treatment Session 2: 75 min  Short Term Goals: Week 1:  PT Short Term Goal 1 (Week 1): Pt will sit>stand from standard furniture w/ MinGuard A PT Short Term Goal 2 (Week 1): Pt will propel w/c 150' w/ BUE and mod (I) PT Short Term Goal 3 (Week 1): Pt will ambulate 85' w/ MinGuard A  Skilled Therapeutic Interventions/Progress Updates:    Treatment Session 1: Therapeutic Activity: Pt received in bed in pajamas, agreeable to getting dressed and participating in PT. Pt demonstrates supine to sit transfer from flat bed without rail req increased time and SBA for safety. Pt doffs PJ shirt and dons bra and long sleeve shirt with set up assist. Pt demonstrates sit to stand with RW req CGA and verbal cues for hand placement, then doffs pants & underwear with CGA, then dons them with steadying assist from PT. Prior to leaving room, pt performs a stand-step transfer bed to [facility] with RW req up to min A to stand, then urinates in commode, req emptying from PT. Pt performs hygiene and pants management herself - all with increased time. PT then completes BSC to w/c stand-step transfer req min A for balance.   Gait Training: PT instructs pt in ambulation with RW x 30' req CGA for safety and significantly increased time due to pain.  PT instructs pt in ambulating up/down ramp with RW req CGA and verbal cues for technique, as well as increased time due to pain.   W/C Management: Pt demonstrates w/c propulsion with B UEs x 160' on level surface in controlled environment req SBA for safety. Pt demonstrates ability to lock/unlock brakes and release/swing away legrests with SBA and increased time.   Therapeutic Exercise: PT instructs pt in gentle LE ROM & strengthening  exercises in sitting position: LAQ, seated march, knee squeezes/spreads: x 10 reps to each LE  Treatment Session 2: Gait Training: PT instructs pt in ambulation with RW x 55' req CGA and increased time for safety - wide BOS and heavy reliance on arms.   Neuromuscular Reeducation PT instructs pt in TUG x 3 reps with RW (req CGA for safety) and pt scores: 111 seconds, 106 seconds, and 122 seconds.   Therapeutic Exercise: PT instructs pt in B LE ROM and gentle strengthening exercises in supine: heel slides within available pain tolerance on L, supine hip abduction/adduction - AAROM within small ROM on L, isometric gluteus max contractions (5 second holds), then in ankle pumps: x 6-10 reps   Therapeutic Activity: PT instructs pt in sit to stand with RW req CGA for safety.  PT instructs pt in w/c to/from mat transfer with RW req CGA for safety, and in sit to supine transfer on mat using scooting/passive knee extension method to get L leg onto mat req CGA at trunk as pt transfers from long sit to supine.  PT instructs pt in supine to sit eom transfer req min A for L LE due to pain.  PT instructs pt in w/c to bed transfer with RW req CGA, then mod Afor B LEs sit to supine transfer due to fatigue.   W/C Management: Pt demonstrates ability to lock/unlock brakes and don/doff legrests with increased time. Pt demonstrates w/c propulsion on level surface in controlled environment req SBA for safety and  up/down low ramp req verbal cues for technique - all done with B UEs.   PT gave pt written handouts of how to build a ramp and ramp rental company options. Pt reports she already has a w/c that she inherited and PT requests that family member bring personal w/c in for PT to examine and determine if it is appropriate for this pt. Pt also reports already owning a RW that was fit to her during previous bunion surgery. Pt req increased time to move, but is progressively requiring less assistance. Continue per PT  POC.    Therapy Documentation Precautions:  Precautions Precautions: Fall Precaution Comments: Fall Restrictions Weight Bearing Restrictions: No LLE Weight Bearing: Weight bearing as tolerated Vital Signs: Therapy Vitals Temp: 97.6 F (36.4 C) Temp Source: Oral Pulse Rate: 70 Resp: 20 BP: 116/63 mmHg Patient Position (if appropriate): Sitting Oxygen Therapy SpO2: 94 % O2 Device: Not Delivered Pain: Pain Assessment Pain Assessment: 0-10 Pain Score: 6  Pain Type: Acute pain Pain Location: Pelvis Pain Orientation: Left Pain Descriptors / Indicators: Aching Pain Onset: With Activity Patients Stated Pain Goal: 2 Pain Intervention(s): Rest Multiple Pain Sites: No Treatment Session 2: Pt c/o 10/10 L pelvic pain during transition movement of sit to stand, but this pain decreases once resting.   Balance: Balance Balance Assessed: Yes Standardized Balance Assessment Standardized Balance Assessment: Timed Up and Go Test Timed Up and Go Test TUG: Normal TUG Normal TUG (seconds): 106  See FIM for current functional status  Therapy/Group: Individual Therapy  Shahan Starks M 09/21/2014, 8:34 AM

## 2014-09-21 NOTE — Progress Notes (Signed)
Social Work Patient ID: [name], female   DOB: 01/25/1926, 81 y.o.   MRN: [id number]   Met with pt, spouse and step-daughter this morning to review team conference information.  All aware and agreeable with targeted d/c date of 5/19 with mod independent w/c goals.  Discussed need for ramp and family prepared to build (they have instructions from therapists).  They understand and agree with plan for HH follow up.  Husband reports that they have a long-term care policy and we discussed how these typically work and provided them with list of private duty agencies.  Will continue to follow for support and d/c planning needs.  , , LCSW  

## 2014-09-21 NOTE — Progress Notes (Signed)
Patient Name: [name] Date of Encounter: 09/21/2014     Principal Problem:   Fracture of left superior pubic ramus Active Problems:   PAF (paroxysmal atrial fibrillation)   Chest pain, musculoskeletal    SUBJECTIVE  Patient states she is doing better today. She is back in NSR clinically. States she can tell when she is out of rhythm. Yesterday she was out of rhythm until last evening. At home her pacemaker indicates that she is in atrial fibrillation 26% of the time according to patient.  CURRENT MEDS . calcium-vitamin D  1 tablet Oral BID  . dabigatran  75 mg Oral Q12H  . diclofenac sodium  2 g Topical TID  . dofetilide  250 mcg Oral BID  . famotidine  20 mg Oral BID  . fluorometholone  1 drop Both Eyes Daily  . lidocaine  1 patch Transdermal Q24H  . magnesium oxide  400 mg Oral Daily  . metoprolol succinate  50 mg Oral BID  . multivitamin with minerals  1 tablet Oral Daily  . rosuvastatin  5 mg Oral QPM  . senna-docusate  2 tablet Oral QHS  . sucralfate  1 g Oral QID    OBJECTIVE  Filed Vitals:   09/20/14 1330 09/20/14 1500 09/20/14 2205 09/21/14 0500  BP: 121/72 118/78 140/70 129/63  Pulse: 88 92 76 69  Temp: 97.6 F (36.4 C) 97.7 F (36.5 C)  97.9 F (36.6 C)  TempSrc: Oral Oral  Oral  Resp: 18 18  18   Height:      Weight:    156 lb 12.8 oz (71.124 kg)  SpO2: 98% 98%  97%    Intake/Output Summary (Last 24 hours) at 09/21/14 1341 Last data filed at 09/21/14 1300  Gross per 24 hour  Intake    600 ml  Output      0 ml  Net    600 ml   Filed Weights   09/19/14 0602 09/20/14 0604 09/21/14 0500  Weight: 155 lb 10.3 oz (70.6 kg) 156 lb 15.5 oz (71.2 kg) 156 lb 12.8 oz (71.124 kg)    PHYSICAL EXAM  General: Pleasant, NAD. Neuro: Alert and oriented X 3. Moves all extremities spontaneously. Psych: Normal affect. HEENT:  Normal.  Resolving ecchymosis left temporal area.  Neck: Supple without bruits or JVD. Lungs:  Resp regular and unlabored,  CTA. Heart: RRR no s3, s4, there is a soft systolic murmur at aortic area. Pacemaker in left upper chest. Abdomen: Soft, non-tender, non-distended, BS + x 4.  Extremities: No clubbing, cyanosis or edema. DP/PT/Radials 2+ and equal bilaterally.  Accessory Clinical Findings  CBC No results for input(s): WBC, NEUTROABS, HGB, HCT, MCV, PLT in the last 72 hours. Basic Metabolic Panel No results for input(s): NA, K, CL, CO2, GLUCOSE, BUN, CREATININE, CALCIUM, MG, PHOS in the last 72 hours. Liver Function Tests No results for input(s): AST, ALT, ALKPHOS, BILITOT, PROT, ALBUMIN in the last 72 hours. No results for input(s): LIPASE, AMYLASE in the last 72 hours. Cardiac Enzymes No results for input(s): CKTOTAL, CKMB, CKMBINDEX, TROPONINI in the last 72 hours. BNP Invalid input(s): POCBNP D-Dimer No results for input(s): DDIMER in the last 72 hours. Hemoglobin A1C No results for input(s): HGBA1C in the last 72 hours. Fasting Lipid Panel No results for input(s): CHOL, HDL, LDLCALC, TRIG, CHOLHDL, LDLDIRECT in the last 72 hours. Thyroid Function Tests No results for input(s): TSH, T4TOTAL, T3FREE, THYROIDAB in the last 72 hours.  Invalid input(s): FREET3  TELE  Not on telemetry.  ECG  Updated EKG pending.  Radiology/Studies  Dg Chest 1 View  09/14/2014   CLINICAL DATA:  Fall today. Left hip and forehead injury. No current chest complaints. History of hypertension and COPD. Initial encounter.  EXAM: CHEST  1 VIEW  COMPARISON:  07/17/2014 and 11/24/2013 radiographs.  FINDINGS: 1339 hours. Left subclavian pacemaker leads appear unchanged within the right atrium and right ventricle. The heart size and mediastinal contours are stable. There is atherosclerosis of the aortic arch. The lungs are clear. There is no pleural effusion or pneumothorax. No acute fractures identified. Embolization coils in the upper abdomen are partially imaged.  IMPRESSION: Stable chest.  No acute cardiopulmonary  process.   Electronically Signed   By: Richardean Sale M.D.   On: 09/14/2014 13:58   Ct Head Wo Contrast  09/14/2014   CLINICAL DATA:  Patient fell and hip left-side of head. Left eye pain with hematoma. History of hypertension, melanoma and TIA's. Initial encounter.  EXAM: CT HEAD WITHOUT CONTRAST  CT CERVICAL SPINE WITHOUT CONTRAST  TECHNIQUE: Multidetector CT imaging of the head and cervical spine was performed following the standard protocol without intravenous contrast. Multiplanar CT image reconstructions of the cervical spine were also generated.  COMPARISON:  Prior studies 02/15/2006.  FINDINGS: CT HEAD FINDINGS  There is mildly progressive generalized atrophy. No evidence of acute intracranial hemorrhage, mass lesion, brain edema or extra-axial fluid collection. There is mild periventricular white matter disease which is only slightly progressive from the prior study.  Soft tissue swelling is present in left frontotemporal scalp. The visualized paranasal sinuses are clear. There is a chronic partial left mastoid effusion which appears unchanged. The right mastoid air cells and middle ears are clear.  CT CERVICAL SPINE FINDINGS  The alignment is stable with mild straightening. There is no evidence of acute fracture or traumatic subluxation. There is multilevel spondylosis with disc space loss, uncinate spurring and facet hypertrophy, minimally progressive from the prior study.  No acute soft tissue abnormalities identified. Carotid artery calcifications are present bilaterally. Bilateral thyroid nodularity and calcifications of the left are grossly stable.  IMPRESSION: 1. No acute intracranial, calvarial or cervical spine findings demonstrated. Left frontotemporal scalp soft tissue injury noted. 2. Mild progression in generalized cerebral atrophy. 3. Multilevel cervical spondylosis.   Electronically Signed   By: Richardean Sale M.D.   On: 09/14/2014 14:33   Ct Cervical Spine Wo Contrast  09/14/2014    CLINICAL DATA:  Patient fell and hip left-side of head. Left eye pain with hematoma. History of hypertension, melanoma and TIA's. Initial encounter.  EXAM: CT HEAD WITHOUT CONTRAST  CT CERVICAL SPINE WITHOUT CONTRAST  TECHNIQUE: Multidetector CT imaging of the head and cervical spine was performed following the standard protocol without intravenous contrast. Multiplanar CT image reconstructions of the cervical spine were also generated.  COMPARISON:  Prior studies 02/15/2006.  FINDINGS: CT HEAD FINDINGS  There is mildly progressive generalized atrophy. No evidence of acute intracranial hemorrhage, mass lesion, brain edema or extra-axial fluid collection. There is mild periventricular white matter disease which is only slightly progressive from the prior study.  Soft tissue swelling is present in left frontotemporal scalp. The visualized paranasal sinuses are clear. There is a chronic partial left mastoid effusion which appears unchanged. The right mastoid air cells and middle ears are clear.  CT CERVICAL SPINE FINDINGS  The alignment is stable with mild straightening. There is no evidence of acute fracture or traumatic subluxation. There is multilevel spondylosis with  disc space loss, uncinate spurring and facet hypertrophy, minimally progressive from the prior study.  No acute soft tissue abnormalities identified. Carotid artery calcifications are present bilaterally. Bilateral thyroid nodularity and calcifications of the left are grossly stable.  IMPRESSION: 1. No acute intracranial, calvarial or cervical spine findings demonstrated. Left frontotemporal scalp soft tissue injury noted. 2. Mild progression in generalized cerebral atrophy. 3. Multilevel cervical spondylosis.   Electronically Signed   By: Richardean Sale M.D.   On: 09/14/2014 14:33   Ct Abdomen Pelvis W Contrast  09/14/2014   CLINICAL DATA:  Patient fell today.  Chest and back pain.  EXAM: CT ABDOMEN AND PELVIS WITH CONTRAST  TECHNIQUE:  Multidetector CT imaging of the abdomen and pelvis was performed using the standard protocol following bolus administration of intravenous contrast.  CONTRAST:  89mL OMNIPAQUE IOHEXOL 300 MG/ML  SOLN  COMPARISON:  06/14/2013  FINDINGS: Lower chest: The lung bases are clear. No pleural effusion or pneumothorax. The heart is mildly enlarged. No pericardial effusion. There is tortuosity and mild ectasia of the thoracic aorta but no dissection. Stable cystic lesion just posterior to the descending thoracic aorta likely E a large perineural cyst or dilated nerve root sheath.  Hepatobiliary: No focal hepatic lesions or acute hepatic injury. Numerous coils are noted in the region of the hepatic artery. Mild intrahepatic biliary dilatation. Common bile duct is markedly dilated but this is a stable finding. Prior common bile duct stones noted.  Pancreas: No acute pancreatic injury.  No mass or inflammation.  Spleen: The spleen is intact.  No focal lesions.  Adrenals/Urinary Tract: Stable DN nodularity of both adrenal glands. No acute renal injury. There is a stable 3 cm angioma mild lipoma projecting off the lower pole region of the left kidney.  Stomach/Bowel: The stomach, duodenum, small bowel and colon are grossly normal without oral contrast. No obvious acute injury. No free air or free fluid.  Vascular/Lymphatic: No mesenteric or retroperitoneal mass or adenopathy. Stable abdominal aortic aneurysm with maximal measurements of 3 x 3 cm. The major branch vessels are patent. The major venous structures are patent.  Other: The uterus and ovaries are unremarkable. The bladder is normal. There is a superior pubic ramus fracture on the right side and associated extra peritoneal pelvic hematoma. Probable hematoma in the obturator internus muscle also. No inguinal mass.  Musculoskeletal: Both hips are normally located. No hip fracture. The pubic symphysis and SI joints are intact. No definite sacral fractures.  IMPRESSION: 1. No  acute intra-abdominal injury is identified. 2. Left superior pubic ramus fracture and associated extraperitoneal pelvic hematoma. No hip fracture or sacral fractures identified. 3. 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 4. Stable biliary dilatation. 5. Stable 3 cm infrarenal abdominal aortic aneurysm and advanced atherosclerotic calcifications involving the aorta.   Electronically Signed   By: Marijo Sanes M.D.   On: 09/14/2014 17:39   Dg Hip Unilat With Pelvis 2-3 Views Left  09/14/2014   CLINICAL DATA:  81 year old female who fell from standing on to the left hip with acute pain. Initial encounter.  EXAM: LEFT HIP (WITH PELVIS) 2-3 VIEWS  COMPARISON:  CT Abdomen and Pelvis 06/03/2012.  FINDINGS: Acute left superior pubic ramus fracture with 1/2 shaft with displacement. Acute nondisplaced inferior pubic ramus fracture on that side also suspected.  Elsewhere the pelvis appears intact. Possible healed medial aspect right superior pubic ramus fracture. Both femoral heads are normally located. Proximal left femur is intact. *INSERT* aortoiliac  IMPRESSION: 1.  Mildly displaced left superior pubic ramus fracture. Suspect nondisplaced inferior left pubic ramus fracture. 2. No other acute fracture or dislocation identified about the left hip or pelvis.   Electronically Signed   By: Genevie Ann M.D.   On: 09/14/2014 13:58    ASSESSMENT AND PLAN  1. Paroxysmal atrial fibrillation. CHADS-Vasc score of 4. Continue pradaxa. Presently in sinus rhythm clinically (she can tell when she goes out of rhythm). Would continue current doses of tikosyn and metoprolol. She has not tolerated higher doses of metoprolol in the past. Would not switch to amiodarone at this point. This decision could be made as an outpatient after discharge. Will update her EKG and her electrolytes. 2. Functioning dual chamber pacemaker.  Signed, Warren Danes MD

## 2014-09-21 NOTE — Progress Notes (Signed)
81 y.o. female with history of A Fib--on Pradaxa, h/o melanoma R-eye, PPM who lost her balance and fell onto her left hip and struck her left eye on 09/14/14.  Patient reported that last 3 weeks have been busy and hectic due to multiple appts to get her husband ready for AVR in the future.  Patient with subsequent complains of left groin pain, chest pain and back pain.  She was noted to have left forehead contusion and X rays of left hip revealing mildly displaced left inferior and left superior pubic rami fracture with extraperitoneal pelvic hematoma and stable 3 cm infrarenal AAA. She was evaluated by Dr. Lyla Glassing who recommended WBAT with walker  Subjective/Complaints: Knees feel better. HR elevating with therapy----into 120's?---pt symptomatic with light-headness, fatigue, dizziness    ROS- no further choking on pills Objective: Vital Signs: Blood pressure 129/63, pulse 69, temperature 97.9 F (36.6 C), temperature source Oral, resp. rate 18, height 5\' 2"  (1.575 m), weight 71.124 kg (156 lb 12.8 oz), SpO2 97 %. No results found. No results found for this or any previous visit (from the past 72 hour(s)).   HEENT: normal Cardio: RRR and no murmur in 70's Resp: CTA B/L and unlabored GI: BS positive and NT, ND Extremity:  Edema LLE Skin:   Bruise Left periorbital and left hip/flank. No bruising on chest. Neuro: Alert/Oriented, Anxious and Abnormal Motor 5/5 in BUE, 4/5 RLE, 2- Left HF, KE, 3- ankle Musc/Skel:  Normal and Swelling Left hip and leg, mild, non tender. Left chest wall still tender but improved  -mild knee tenderness  Gen NAD   Assessment/Plan: 1. Functional deficits secondary to Left superior pubic ramus fx s/p fall 09/14/14 which require 3+ hours per day of interdisciplinary therapy in a comprehensive inpatient rehab setting. Physiatrist is providing close team supervision and 24 hour management of active medical problems listed below. Physiatrist and rehab team continue to  assess barriers to discharge/monitor patient progress toward functional and medical goals. FIM: FIM - Bathing Bathing Steps Patient Completed: Chest, Right Arm, Left Arm, Abdomen, Front perineal area, Buttocks, Right upper leg, Left upper leg Bathing: 4: Min-Patient completes 8-9 46f 10 parts or 75+ percent  FIM - Upper Body Dressing/Undressing Upper body dressing/undressing steps patient completed: Thread/unthread right bra strap, Thread/unthread left bra strap, Hook/unhook bra, Thread/unthread right sleeve of pullover shirt/dresss, Thread/unthread left sleeve of pullover shirt/dress, Put head through opening of pull over shirt/dress, Pull shirt over trunk Upper body dressing/undressing: 5: Set-up assist to: Obtain clothing/put away FIM - Lower Body Dressing/Undressing Lower body dressing/undressing steps patient completed: Thread/unthread right underwear leg, Thread/unthread left underwear leg, Pull underwear up/down, Thread/unthread right pants leg, Thread/unthread left pants leg, Pull pants up/down, Don/Doff right sock, Don/Doff left sock Lower body dressing/undressing: 4: Min-Patient completed 75 plus % of tasks  FIM - Toileting Toileting steps completed by patient: Adjust clothing prior to toileting, Performs perineal hygiene Toileting: 3: Mod-Patient completed 2 of 3 steps  FIM - Radio producer Devices: Environmental consultant, Product manager Transfers: 4-To toilet/BSC: Min A (steadying Pt. > 75%), 4-From toilet/BSC: Min A (steadying Pt. > 75%)  FIM - Bed/Chair Transfer Bed/Chair Transfer Assistive Devices: HOB elevated, Bed rails, Arm rests Bed/Chair Transfer: 3: Supine > Sit: Mod A (lifting assist/Pt. 50-74%/lift 2 legs, 4: Sit > Supine: Min A (steadying pt. > 75%/lift 1 leg), 4: Chair or W/C > Bed: Min A (steadying Pt. > 75%), 4: Bed > Chair or W/C: Min A (steadying Pt. > 75%)  FIM - Locomotion: Wheelchair Distance: 150 Locomotion: Wheelchair: 5: Travels 150 ft or  more: maneuvers on rugs and over door sills with supervision, cueing or coaxing FIM - Locomotion: Ambulation Locomotion: Ambulation Assistive Devices: Administrator Ambulation/Gait Assistance: 4: Min assist Locomotion: Ambulation: 0: Activity did not occur  Comprehension Comprehension Mode: Auditory Comprehension: 6-Follows complex conversation/direction: With extra time/assistive device  Expression Expression Mode: Verbal Expression: 5-Expresses complex 90% of the time/cues < 10% of the time  Social Interaction Social Interaction: 5-Interacts appropriately 90% of the time - Needs monitoring or encouragement for participation or interaction.  Problem Solving Problem Solving: 5-Solves complex 90% of the time/cues < 10% of the time  Memory Memory: 5-Recognizes or recalls 90% of the time/requires cueing < 10% of the time  Medical Problem List and Plan: 1. Functional deficits secondary to Left superior and inferior pubic rami fracture with extraperitoneal hematoma and severe left groin pain with movement.   2.  DVT Prophylaxis/Anticoagulation: Pharmaceutical: Pradexa 3. Pain Management: Vicodin effective in pain management.  chest contusion, no rib fx on Xray, lidoderm helpful  -ice to chest/hip as needed  -add voltaren gel to knees (has GERD) 4. Mood: LCSW to follow for evaluation and support.   5. Neuropsych: This patient is capable of making decisions on her own behalf. 6. Skin/Wound Care: Routine pressure relief measures. Monitor hematoma left eyebrow.   7. Fluids/Electrolytes/Nutrition: Monitor I/O. Offer supplements prn poor intake. Check lytes    8.  Extraperitoneal hematoma: Monitor H/H with serial labs    9.  PAF: Will monitor HR tid and watch for recurrent tachycardia. Continue Tikosyn and metoprolol current dose. Multiple interactions with tikosyn-- limit SSRI, anti emetic use  -will ask cards to follow up as she continues to be symptomatic with exertion/increased HR   10. COPD?: Reports h/o bronchitis and allergies--denies any respiratory issues.    11. HTN: Monitor with tid checks. Continue Metoprolol.  12. Hyperkalemia:    13. Leucocytosis: ua neg, cx with multispecies, afebrile 14. Chronic anorexia: will add nutritional supplements. Cont  Carafate, added pepcid   LOS (Days) 5 A FACE TO FACE EVALUATION WAS PERFORMED  Wayburn Shaler T 09/21/2014, 7:15 AM

## 2014-09-21 NOTE — Progress Notes (Signed)
Occupational Therapy Session Note  Patient Details  Name: [name] MRN: [id number] Date of Birth: 22-Sep-1925  Today's Date: 09/21/2014 OT Individual Time: 1000-1110 OT Individual Time Calculation (min): 70 min    Short Term Goals: Week 1:  OT Short Term Goal 1 (Week 1): Patient will complete toilet transfers with min assist. OT Short Term Goal 2 (Week 1): Patient will complete LB dressing using AE with min assist. OT Short Term Goal 3 (Week 1): Patient will complete toileting with min assist. OT Short Term Goal 4 (Week 1): Patient will complete toilet transfers via stand step ambulation with RW with min assist. OT Short Term Goal 5 (Week 1): Patient will complete shower transfers via stand step ambulation with RW with min assist.  Skilled Therapeutic Interventions/Progress Updates:  Upon entering the room, pt seated in wheelchair with 6/10 c/o pain in L hip. Pt reporting, "I just finished PT and I am so tired." OT educated pt on energy conservation as well as pain management techniques. Pt discussing what has helped to alleviate vs. increase pain in L hip. Pt ambulating to bathroom with min A and use of RW. Pt with very slow gait speed and stopping several times to stand and rest secondary to increased pain and fatigue. Pt ambulating ~ 15 feet with Mod A toilet transfer onto standard toilet with grab bars and RW. Pt requiring steady assist for clothing management and hygiene. Pt sitting to rest before returning to wheelchair in same manner as above. Pt then reporting 10/10 pain in L hip. RN notified to provide medication. OT asking RN to also discuss frequency of pain medications as well as prn medications in order to increase pain management with therapy session. Pt practiced utilizing [place] reacher and sock aide in order to don and doff B socks and shoes with elastic laces with verbal cues and min A. Pt seated in wheelchair with call bell and all needed items within reach upon exiting the room.    Therapy Documentation Precautions:  Precautions Precautions: Fall Precaution Comments: Fall Restrictions Weight Bearing Restrictions: No LLE Weight Bearing: Weight bearing as tolerated Pain: Pain Assessment Pain Assessment: 0-10 Pain Score: 10  Pain Type: Acute pain Pain Location: Hip Pain Orientation: Left Pain Descriptors / Indicators: Aching Pain Frequency: Intermittent Pain Onset: With Activity Patients Stated Pain Goal: 3 Pain Intervention(s): Medication (See eMAR) Multiple Pain Sites: No  See FIM for current functional status  Therapy/Group: Individual Therapy  Phineas Semen 09/21/2014, 11:43 AM

## 2014-09-22 ENCOUNTER — Inpatient Hospital Stay (HOSPITAL_COMMUNITY): Payer: Medicare Other | Admitting: Physical Therapy

## 2014-09-22 ENCOUNTER — Encounter (HOSPITAL_COMMUNITY): Payer: Self-pay | Admitting: Physician Assistant

## 2014-09-22 ENCOUNTER — Inpatient Hospital Stay (HOSPITAL_COMMUNITY): Payer: Medicare Other

## 2014-09-22 DIAGNOSIS — I714 Abdominal aortic aneurysm, without rupture, unspecified: Secondary | ICD-10-CM | POA: Diagnosis present

## 2014-09-22 LAB — BASIC METABOLIC PANEL
ANION GAP: 8 (ref 5–15)
BUN: 14 mg/dL (ref 6–20)
CALCIUM: 8.9 mg/dL (ref 8.9–10.3)
CHLORIDE: 106 mmol/L (ref 101–111)
CO2: 24 mmol/L (ref 22–32)
Creatinine, Ser: 0.72 mg/dL (ref 0.44–1.00)
GFR calc Af Amer: >60 mL/min (ref >60)
GFR calc non Af Amer: >60 mL/min (ref >60)
Glucose, Bld: 91 mg/dL (ref 65–99)
Potassium: 4.2 mmol/L (ref 3.5–5.1)
Sodium: 138 mmol/L (ref 135–145)

## 2014-09-22 LAB — MAGNESIUM: Magnesium: 2.2 mg/dL (ref 1.7–2.4)

## 2014-09-22 NOTE — Progress Notes (Signed)
Physical Therapy Session Note  Patient Details  Name: [name] MRN: [id number] Date of Birth: 11-25-1925  Today's Date: 09/22/2014 PT Individual Time: 1100-1200 Treatment Session 2: [id number] PT Individual Time Calculation (min): 60 min Treatment Session 2: 90 min  Short Term Goals: Week 1:  PT Short Term Goal 1 (Week 1): Pt will sit>stand from standard furniture w/ MinGuard A PT Short Term Goal 2 (Week 1): Pt will propel w/c 150' w/ BUE and mod (I) PT Short Term Goal 3 (Week 1): Pt will ambulate 12' w/ MinGuard A  Skilled Therapeutic Interventions/Progress Updates:    Treatment Session 1: Therapeutic Activity: Pt reports she needs to use bathroom prior to leaving room. PT instructs pt in sit to stand from w/c with RW req CGA-min A, then ambulate 10' into bathroom to [facility] over toilet req CGA for safety and steadying assist while pt manages pants. Pt also manages hygiene after urination and req same level of assist to transfer off toilet back to w/c.  Pt demonstrates sit to supine transfer with passive knee extension getting legs onto mat by scooting posterior req SBA for safety, and supine to sit transfer on mat req SBA and verbal cues for modified long sit technique.  PT instructs pt in stand-step transfer w/c to/from mat with RW req SBA and increased time.   Gait Training: PT instructs pt in ambulation with RW x 63' req CGA progressing to SBA - verbal cues to look up and drop shoulders.   Therapeutic Exercise: PT instructs pt in mat level exercises within pain tolerance: heel slides, supine hip abduction/adduction - AAROM for L LE, isometric glute max contractions (5 second holds), ankle pumps: x 6-10 reps each LE  Treatment Session 2: Therapeutic Activity: Pt reports she needs to use the bathroom prior to leaving the room - PT instructs pt in toilet transfer with RW req SBA to [facility] over toilet - pt manages pants & hygiene with SBA for safety.   Community Integration: PT instructs  pt in w/c propulsion in community setting, including on/off elevator, on uneven surfaces, up/down ramp, req up to min A for steering when ground is tilted laterally. PT instructs pt in ambulation with RW in community setting x 70' + 120' req close SBA-CGA for safety - no LOB - focus is on variable ground surfaces: tile, carpet, cement, and brick.   Gait Training: PT instructs pt in ascending/descending 1 + 2 low steps with B rail req min A and verbal cues: up with R leg, down with L leg.  PT instructs pt in ambulation with RW req SBA x 150' req SBA and slow time.    Pt is slowly progressing with mobility due to improved pain relief and mobility techniques. Continue per PT POC.   Therapy Documentation Precautions:  Precautions Precautions: Fall Precaution Comments: Fall Restrictions Weight Bearing Restrictions: No LLE Weight Bearing: Weight bearing as tolerated Pain: Pain Assessment Pain Assessment: 0-10 Pain Score: 9  Pain Type: Acute pain Pain Location: Pelvis Pain Orientation: Left Pain Descriptors / Indicators: Aching Pain Frequency: Intermittent Pain Onset: On-going Patients Stated Pain Goal: 3 Pain Intervention(s): Rest Multiple Pain Sites: No Treatment Session 2: Pt c/o 9/10 L pelvic pain, but had just received pain medication to reduce this pain.   See FIM for current functional status  Therapy/Group: Individual Therapy  Hazelyn Kallen M 09/22/2014, 8:47 AM

## 2014-09-22 NOTE — Progress Notes (Addendum)
Patient: [name] / Admit Date: 09/16/2014 / Date of Encounter: 09/22/2014, 11:03 AM   Subjective: No further palpitations since the other day. She feels she is progressing well with rehab.  Objective: Telemetry: N/A Physical Exam: Blood pressure 126/77, pulse 71, temperature 97.9 F (36.6 C), temperature source Oral, resp. rate 18, height 5\' 2"  (1.575 m), weight 162 lb 9.6 oz (73.755 kg), SpO2 97 %. General: Well developed, well nourished WF in no acute distress. Head: Normocephalic, sclera non-icteric, no xanthomas, nares are without discharge. Resolving ecchymosis left temporal area. Neck: JVP not elevated. Lungs: Clear bilaterally to auscultation without wheezes, rales, or rhonchi. Breathing is unlabored. Heart: RRR S1 S2. Soft SEM at RUSB. No rubs or gallops.  Abdomen: Soft, non-tender, non-distended with normoactive bowel sounds. No rebound/guarding. Extremities: No clubbing or cyanosis. No edema. Distal pedal pulses are 2+ and equal bilaterally. Neuro: Alert and oriented X 3. Moves all extremities spontaneously. Psych:  Responds to questions appropriately with a normal affect.   Intake/Output Summary (Last 24 hours) at 09/22/14 1103 Last data filed at 09/22/14 0900  Gross per 24 hour  Intake    600 ml  Output      0 ml  Net    600 ml    Inpatient Medications:  . calcium-vitamin D  1 tablet Oral BID  . dabigatran  75 mg Oral Q12H  . diclofenac sodium  2 g Topical TID  . dofetilide  250 mcg Oral BID  . famotidine  20 mg Oral BID  . fluorometholone  1 drop Both Eyes Daily  . lidocaine  1 patch Transdermal Q24H  . magnesium oxide  400 mg Oral Daily  . metoprolol succinate  50 mg Oral BID  . multivitamin with minerals  1 tablet Oral Daily  . rosuvastatin  5 mg Oral QPM  . senna-docusate  2 tablet Oral QHS  . sucralfate  1 g Oral QID   Infusions:    Labs:  Recent Labs  09/22/14 0500 09/22/14 0615  NA  --  138  K  --  4.2  CL  --  106  CO2  --  24    GLUCOSE  --  91  BUN  --  14  CREATININE  --  0.72  CALCIUM  --  8.9  MG 2.2  --    No results for input(s): AST, ALT, ALKPHOS, BILITOT, PROT, ALBUMIN in the last 72 hours. No results for input(s): WBC, NEUTROABS, HGB, HCT, MCV, PLT in the last 72 hours. No results for input(s): CKTOTAL, CKMB, TROPONINI in the last 72 hours. Invalid input(s): POCBNP No results for input(s): HGBA1C in the last 72 hours.   Radiology/Studies:  Dg Chest 1 View  09/14/2014   CLINICAL DATA:  Fall today. Left hip and forehead injury. No current chest complaints. History of hypertension and COPD. Initial encounter.  EXAM: CHEST  1 VIEW  COMPARISON:  07/17/2014 and 11/24/2013 radiographs.  FINDINGS: 1339 hours. Left subclavian pacemaker leads appear unchanged within the right atrium and right ventricle. The heart size and mediastinal contours are stable. There is atherosclerosis of the aortic arch. The lungs are clear. There is no pleural effusion or pneumothorax. No acute fractures identified. Embolization coils in the upper abdomen are partially imaged.  IMPRESSION: Stable chest.  No acute cardiopulmonary process.   Electronically Signed   By: Richardean Sale M.D.   On: 09/14/2014 13:58   Ct Head Wo Contrast  09/14/2014   CLINICAL DATA:  Patient fell and  hip left-side of head. Left eye pain with hematoma. History of hypertension, melanoma and TIA's. Initial encounter.  EXAM: CT HEAD WITHOUT CONTRAST  CT CERVICAL SPINE WITHOUT CONTRAST  TECHNIQUE: Multidetector CT imaging of the head and cervical spine was performed following the standard protocol without intravenous contrast. Multiplanar CT image reconstructions of the cervical spine were also generated.  COMPARISON:  Prior studies 02/15/2006.  FINDINGS: CT HEAD FINDINGS  There is mildly progressive generalized atrophy. No evidence of acute intracranial hemorrhage, mass lesion, brain edema or extra-axial fluid collection. There is mild periventricular white matter disease  which is only slightly progressive from the prior study.  Soft tissue swelling is present in left frontotemporal scalp. The visualized paranasal sinuses are clear. There is a chronic partial left mastoid effusion which appears unchanged. The right mastoid air cells and middle ears are clear.  CT CERVICAL SPINE FINDINGS  The alignment is stable with mild straightening. There is no evidence of acute fracture or traumatic subluxation. There is multilevel spondylosis with disc space loss, uncinate spurring and facet hypertrophy, minimally progressive from the prior study.  No acute soft tissue abnormalities identified. Carotid artery calcifications are present bilaterally. Bilateral thyroid nodularity and calcifications of the left are grossly stable.  IMPRESSION: 1. No acute intracranial, calvarial or cervical spine findings demonstrated. Left frontotemporal scalp soft tissue injury noted. 2. Mild progression in generalized cerebral atrophy. 3. Multilevel cervical spondylosis.   Electronically Signed   By: Richardean Sale M.D.   On: 09/14/2014 14:33   Ct Cervical Spine Wo Contrast  09/14/2014   CLINICAL DATA:  Patient fell and hip left-side of head. Left eye pain with hematoma. History of hypertension, melanoma and TIA's. Initial encounter.  EXAM: CT HEAD WITHOUT CONTRAST  CT CERVICAL SPINE WITHOUT CONTRAST  TECHNIQUE: Multidetector CT imaging of the head and cervical spine was performed following the standard protocol without intravenous contrast. Multiplanar CT image reconstructions of the cervical spine were also generated.  COMPARISON:  Prior studies 02/15/2006.  FINDINGS: CT HEAD FINDINGS  There is mildly progressive generalized atrophy. No evidence of acute intracranial hemorrhage, mass lesion, brain edema or extra-axial fluid collection. There is mild periventricular white matter disease which is only slightly progressive from the prior study.  Soft tissue swelling is present in left frontotemporal scalp. The  visualized paranasal sinuses are clear. There is a chronic partial left mastoid effusion which appears unchanged. The right mastoid air cells and middle ears are clear.  CT CERVICAL SPINE FINDINGS  The alignment is stable with mild straightening. There is no evidence of acute fracture or traumatic subluxation. There is multilevel spondylosis with disc space loss, uncinate spurring and facet hypertrophy, minimally progressive from the prior study.  No acute soft tissue abnormalities identified. Carotid artery calcifications are present bilaterally. Bilateral thyroid nodularity and calcifications of the left are grossly stable.  IMPRESSION: 1. No acute intracranial, calvarial or cervical spine findings demonstrated. Left frontotemporal scalp soft tissue injury noted. 2. Mild progression in generalized cerebral atrophy. 3. Multilevel cervical spondylosis.   Electronically Signed   By: Richardean Sale M.D.   On: 09/14/2014 14:33   Ct Abdomen Pelvis W Contrast  09/14/2014   CLINICAL DATA:  Patient fell today.  Chest and back pain.  EXAM: CT ABDOMEN AND PELVIS WITH CONTRAST  TECHNIQUE: Multidetector CT imaging of the abdomen and pelvis was performed using the standard protocol following bolus administration of intravenous contrast.  CONTRAST:  90mL OMNIPAQUE IOHEXOL 300 MG/ML  SOLN  COMPARISON:  06/14/2013  FINDINGS: Lower chest: The lung bases are clear. No pleural effusion or pneumothorax. The heart is mildly enlarged. No pericardial effusion. There is tortuosity and mild ectasia of the thoracic aorta but no dissection. Stable cystic lesion just posterior to the descending thoracic aorta likely E a large perineural cyst or dilated nerve root sheath.  Hepatobiliary: No focal hepatic lesions or acute hepatic injury. Numerous coils are noted in the region of the hepatic artery. Mild intrahepatic biliary dilatation. Common bile duct is markedly dilated but this is a stable finding. Prior common bile duct stones noted.   Pancreas: No acute pancreatic injury.  No mass or inflammation.  Spleen: The spleen is intact.  No focal lesions.  Adrenals/Urinary Tract: Stable DN nodularity of both adrenal glands. No acute renal injury. There is a stable 3 cm angioma mild lipoma projecting off the lower pole region of the left kidney.  Stomach/Bowel: The stomach, duodenum, small bowel and colon are grossly normal without oral contrast. No obvious acute injury. No free air or free fluid.  Vascular/Lymphatic: No mesenteric or retroperitoneal mass or adenopathy. Stable abdominal aortic aneurysm with maximal measurements of 3 x 3 cm. The major branch vessels are patent. The major venous structures are patent.  Other: The uterus and ovaries are unremarkable. The bladder is normal. There is a superior pubic ramus fracture on the right side and associated extra peritoneal pelvic hematoma. Probable hematoma in the obturator internus muscle also. No inguinal mass.  Musculoskeletal: Both hips are normally located. No hip fracture. The pubic symphysis and SI joints are intact. No definite sacral fractures.  IMPRESSION: 1. No acute intra-abdominal injury is identified. 2. Left superior pubic ramus fracture and associated extraperitoneal pelvic hematoma. No hip fracture or sacral fractures identified. 3. 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 4. Stable biliary dilatation. 5. Stable 3 cm infrarenal abdominal aortic aneurysm and advanced atherosclerotic calcifications involving the aorta.   Electronically Signed   By: Marijo Sanes M.D.   On: 09/14/2014 17:39   Dg Hip Unilat With Pelvis 2-3 Views Left  09/14/2014   CLINICAL DATA:  81 year old female who fell from standing on to the left hip with acute pain. Initial encounter.  EXAM: LEFT HIP (WITH PELVIS) 2-3 VIEWS  COMPARISON:  CT Abdomen and Pelvis 06/03/2012.  FINDINGS: Acute left superior pubic ramus fracture with 1/2 shaft with displacement. Acute nondisplaced inferior pubic ramus  fracture on that side also suspected.  Elsewhere the pelvis appears intact. Possible healed medial aspect right superior pubic ramus fracture. Both femoral heads are normally located. Proximal left femur is intact. *INSERT* aortoiliac  IMPRESSION: 1. Mildly displaced left superior pubic ramus fracture. Suspect nondisplaced inferior left pubic ramus fracture. 2. No other acute fracture or dislocation identified about the left hip or pelvis.   Electronically Signed   By: Genevie Ann M.D.   On: 09/14/2014 13:58     Assessment and Plan  41F with history of HTN, PAF, sinus node dysfunction s/p pacemaker, h/o RF ablation but paroxysmal atrial fib despite AA therapy, now on Tikosyn. Admitted 5/6 with fall and hip fracture. Does not usually fall.  1. Paroxysmal atrial fibrillation - progressing well with rehab. She is maintained on lower dose of Pradaxa by primary cardiologist. Will need to reconsider anticoagulation if she falls again but this was an unusual event for the patient. Continue current dose of Tikosyn. Lytes OK. EKG with TW changes similar to prior values. QTC borderline elevated but in the setting of incomplete RBBB  with QRS 114 -- QTC still <500. No new recs.  2. Valvular disease with mild AS, mild MR, mild-mod TR by echo 06/2014 - following clinically.  3. Stable 3cm infrarenal abdominal aortic aneurysm by CT - will follow clinically.  SignedMelina Copa PA-C Pager: [phone]  Attending Note:   The patient was seen and examined.  Agree with assessment and plan as noted above.  Changes made to the above note as needed.  Patient is doing well.  On rehab. Not on tele. Continue current meds. .   Follow up with primary cardiologist.  Will sign off.  Call for questions    Ramond Dial., MD, [facility] 09/22/2014, 1:16 PM [address],  [place] Pager [phone]

## 2014-09-22 NOTE — Progress Notes (Signed)
Occupational Therapy Session Note  Patient Details  Name: [name] MRN: [id number] Date of Birth: October 13, 1925  Today's Date: 09/22/2014 OT Individual Time: 0930-1030 OT Individual Time Calculation (min): 60 min   Short Term Goals: Week 1:  OT Short Term Goal 1 (Week 1): Patient will complete toilet transfers with min assist. OT Short Term Goal 2 (Week 1): Patient will complete LB dressing using AE with min assist. OT Short Term Goal 3 (Week 1): Patient will complete toileting with min assist. OT Short Term Goal 4 (Week 1): Patient will complete toilet transfers via stand step ambulation with RW with min assist. OT Short Term Goal 5 (Week 1): Patient will complete shower transfers via stand step ambulation with RW with min assist.  Skilled Therapeutic Interventions/Progress Updates: ADL-retraing at tub/shower unit with focus on safety awareness, transfers, dynamic sitting balance, and AE skills development.   Pt received seated at EOB and receptive for shower using tub/shower and TTB.   Pt currently requires extra time and steadying assist for transfers, bathes with setup assist to provide supplies, and dresses with steadying assist while standing to pull up underwear and pants.   Pt demo's mild LOB 3 times during session and tends to dismiss LOB without awareness of fall risk.   Pt uses sock aid, elastic shoe laces and LH sponge but requires reinforcement to use reacher to extend reach beyond her BOS.   Pt reports need for tub bench at discharge as she only possesses a shower chair in her tub.    Therapy Documentation Precautions:  Precautions Precautions: Fall Precaution Comments: Fall Restrictions Weight Bearing Restrictions: No LLE Weight Bearing: Weight bearing as tolerated  Pain: Pain Assessment Pain Assessment: 0-10 Pain Score: 3  Pain Type: Acute pain Pain Location: Hip Pain Orientation: Left Pain Descriptors / Indicators: Throbbing Pain Frequency: Intermittent Pain  Onset: With Activity Patients Stated Pain Goal: 3 Pain Intervention(s): Medication (See eMAR) Multiple Pain Sites: No   See FIM for current functional status  Therapy/Group: Individual Therapy  Rhealynn Myhre 09/22/2014, 11:00 AM

## 2014-09-22 NOTE — Progress Notes (Signed)
81 y.o. female with history of A Fib--on Pradaxa, h/o melanoma R-eye, PPM who lost her balance and fell onto her left hip and struck her left eye on 09/14/14.  Patient reported that last 3 weeks have been busy and hectic due to multiple appts to get her husband ready for AVR in the future.  Patient with subsequent complains of left groin pain, chest pain and back pain.  She was noted to have left forehead contusion and X rays of left hip revealing mildly displaced left inferior and left superior pubic rami fracture with extraperitoneal pelvic hematoma and stable 3 cm infrarenal AAA. She was evaluated by Dr. Lyla Glassing who recommended WBAT with walker  Subjective/Complaints: Had a good day yesterday. Heart was regular. Tolerated therapy well. Walked to nurses station    ROS- no further choking on pills Objective: Vital Signs: Blood pressure 126/77, pulse 71, temperature 97.9 F (36.6 C), temperature source Oral, resp. rate 18, height 5\' 2"  (1.575 m), weight 73.755 kg (162 lb 9.6 oz), SpO2 97 %. No results found. No results found for this or any previous visit (from the past 72 hour(s)).   HEENT: normal Cardio: RRR and no murmur in 70's Resp: CTA B/L and unlabored GI: BS positive and NT, ND Extremity:  Edema LLE Skin:   Bruise Left periorbital and left hip/flank. No bruising on chest. Neuro: Alert/Oriented, Anxious and Abnormal Motor 5/5 in BUE, 4/5 RLE, 2- Left HF, KE, 3- ankle Musc/Skel:  Normal and Swelling Left hip and leg, mild, non tender. Left chest wall still tender but improved  -mild knee tenderness  Gen NAD   Assessment/Plan: 1. Functional deficits secondary to Left superior pubic ramus fx s/p fall 09/14/14 which require 3+ hours per day of interdisciplinary therapy in a comprehensive inpatient rehab setting. Physiatrist is providing close team supervision and 24 hour management of active medical problems listed below. Physiatrist and rehab team continue to assess barriers to  discharge/monitor patient progress toward functional and medical goals. FIM: FIM - Bathing Bathing Steps Patient Completed: Chest, Right Arm, Left Arm, Abdomen, Front perineal area, Buttocks, Right upper leg, Left upper leg Bathing: 4: Min-Patient completes 8-9 55f 10 parts or 75+ percent  FIM - Upper Body Dressing/Undressing Upper body dressing/undressing steps patient completed: Pull shirt over trunk, Thread/unthread left sleeve of pullover shirt/dress, Put head through opening of pull over shirt/dress, Thread/unthread right sleeve of pullover shirt/dresss Upper body dressing/undressing: 5: Set-up assist to: Obtain clothing/put away FIM - Lower Body Dressing/Undressing Lower body dressing/undressing steps patient completed: Thread/unthread right underwear leg, Thread/unthread left underwear leg, Pull underwear up/down, Thread/unthread right pants leg, Thread/unthread left pants leg, Pull pants up/down Lower body dressing/undressing: 4: Steadying Assist  FIM - Toileting Toileting steps completed by patient: Adjust clothing prior to toileting, Performs perineal hygiene, Adjust clothing after toileting Toileting: 4: Steadying assist  FIM - Radio producer Devices: Environmental consultant, Product manager Transfers: 3-To toilet/BSC: Mod A (lift or lower assist), 3-From toilet/BSC: Mod A (lift or lower assist)  FIM - Control and instrumentation engineer Devices: Walker, Arm rests Bed/Chair Transfer: 5: Supine > Sit: Supervision (verbal cues/safety issues), 4: Bed > Chair or W/C: Min A (steadying Pt. > 75%), 4: Chair or W/C > Bed: Min A (steadying Pt. > 75%)  FIM - Locomotion: Wheelchair Distance: 160 Locomotion: Wheelchair: 5: Travels 150 ft or more: maneuvers on rugs and over door sills with supervision, cueing or coaxing FIM - Locomotion: Ambulation Locomotion: Ambulation Assistive Devices: Administrator  Ambulation/Gait Assistance: 4: Min guard Locomotion:  Ambulation: 2: Travels 50 - 149 ft with minimal assistance (Pt.>75%)  Comprehension Comprehension Mode: Auditory Comprehension: 7-Follows complex conversation/direction: With no assist  Expression Expression Mode: Verbal Expression: 7-Expresses complex ideas: With no assist  Social Interaction Social Interaction: 7-Interacts appropriately with others - No medications needed.  Problem Solving Problem Solving: 7-Solves complex problems: Recognizes & self-corrects  Memory Memory: 7-Complete Independence: No helper  Medical Problem List and Plan: 1. Functional deficits secondary to Left superior and inferior pubic rami fracture with extraperitoneal hematoma and severe left groin pain with movement.   2.  DVT Prophylaxis/Anticoagulation: Pharmaceutical: Pradexa 3. Pain Management: Vicodin effective in pain management.  chest contusion, no rib fx on Xray, lidoderm helpful  -ice to chest/hip as needed  -  voltaren gel to knees (has GERD) was very helpful---would like to go home with this 4. Mood: LCSW to follow for evaluation and support.   5. Neuropsych: This patient is capable of making decisions on her own behalf. 6. Skin/Wound Care: Routine pressure relief measures. Monitor hematoma left eyebrow.   7. Fluids/Electrolytes/Nutrition: Monitor I/O. Offer supplements prn poor intake. Check lytes    8.  Extraperitoneal hematoma: Monitor H/H with serial labs    9.  PAF: Will monitor HR tid and watch for recurrent tachycardia. Continue Tikosyn and metoprolol current dose. Multiple interactions with tikosyn-- limit SSRI, anti emetic use  -appreciate cards follow up 10. COPD?: Reports h/o bronchitis and allergies--denies any respiratory issues.    11. HTN: Monitor with tid checks. Continue Metoprolol.  12. Hyperkalemia:  resolved 13. Leucocytosis: ua neg, cx with multispecies, afebrile 14. Chronic anorexia: will add nutritional supplements. Cont  Carafate, added pepcid   LOS (Days) 6 A  FACE TO FACE EVALUATION WAS PERFORMED  Lis Savitt T 09/22/2014, 7:21 AM

## 2014-09-23 ENCOUNTER — Inpatient Hospital Stay (HOSPITAL_COMMUNITY): Payer: Medicare Other | Admitting: Occupational Therapy

## 2014-09-23 ENCOUNTER — Encounter: Payer: Self-pay | Admitting: Cardiology

## 2014-09-23 ENCOUNTER — Inpatient Hospital Stay (HOSPITAL_COMMUNITY): Payer: Medicare Other

## 2014-09-23 ENCOUNTER — Inpatient Hospital Stay (HOSPITAL_COMMUNITY): Payer: Medicare Other | Admitting: Physical Therapy

## 2014-09-23 DIAGNOSIS — I38 Endocarditis, valve unspecified: Secondary | ICD-10-CM

## 2014-09-23 DIAGNOSIS — I714 Abdominal aortic aneurysm, without rupture: Secondary | ICD-10-CM

## 2014-09-23 NOTE — Progress Notes (Signed)
Physical Therapy Weekly Progress Note  Patient Details  Name: [name] MRN: [id number] Date of Birth: April 03, 1926  Beginning of progress report period: Sep 16, 2014 End of progress report period: Sep 23, 2014  Today's Date: 09/23/2014 PT Individual Time: 1000-1100 Treatment Session 2: 1500-1600 PT Individual Time Calculation (min): 60 min Treatment Session 2: 60 min  Patient has met 3 of 3 short term goals.  Pt's pain tolerance has improved and techniques for completing functional mobility with pain-inhibited L LE weakness has improved over this past week.  Patient continues to demonstrate the following deficits: limited activity tolerance, pain-inhibited movement, difficulty with bed mobility, specifically sit to supine in bed, limited gait tolerance, and difficulty with stairs and therefore will continue to benefit from skilled PT intervention to enhance overall performance with activity tolerance, balance, ability to compensate for deficits, functional use of  left lower extremity and coordination.  Patient progressing toward long term goals..  Continue plan of care.  PT Short Term Goals Week 1:  PT Short Term Goal 1 (Week 1): Pt will sit>stand from standard furniture w/ MinGuard A PT Short Term Goal 1 - Progress (Week 1): Met PT Short Term Goal 2 (Week 1): Pt will propel w/c 150' w/ BUE and mod (I) PT Short Term Goal 2 - Progress (Week 1): Met PT Short Term Goal 3 (Week 1): Pt will ambulate 41' w/ MinGuard A PT Short Term Goal 3 - Progress (Week 1): Met Week 2:  PT Short Term Goal 1 (Week 2): STGs = LTGs due to ELOS  Skilled Therapeutic Interventions/Progress Updates:    Treatment Session 1: Therapeutic Activity: Pt received sitting up in w/c, looking very fatigued. Pt reports her heart is "out of rhythm". PT takes pt's vitals and notes very high diastolic BP, as well as high resting HR (120's). PT notifies RN, who takes pt's vital signs manually. Pt is very fatigued and so PT  assists pt back to bed, stand-step transfer w/c to bed with RW req SBA for safety and min A for L Leg onto bed due to pt SOB, fatigue, and difficulty using scooting method to passively achieve L LE onto bed. Pt reports that at home, MD told pt to "lie down, turn the lights off, and take an extra metroprolol". PT relayed this information to RN. RN takes BP with pt in supine with HOB elevated 15 degrees (per pt comfort level) and notes it is normal each time. Pt agreeable to bed level exercises.   Therapeutic Exercise: PT instructs pt in gentle ROM & strengthening exercises A/AROM as needed: heel slides, supine hip abduction/adduction (washcloth placed under L hip for gentle approximation assistance and pt reports this significantly reduces pain during exercise), isometric glute max contractions - 5 second holds (while pt counts aloud to ensure breathing), ankle pumps, hip IR/ER with knees straight, SAQ with pillow under knees: 2 x 10 reps to each LE with multiple rest breaks as needed  Treatment Session 2: Gait Training: PT instructs pt in ambulation with RW x 95' req SBA for safety - with one standing rest break due to SOB. Pt's HR at 122 bpm and SaO2 on RA at 95% after this ambulation.  PT instructs pt in ascending/descending 3 low steps with B rails req CGA-min A, as needed, in step-to manner: up with R leg, down with L leg.   Therapeutic Activity: Pt reports she needs to urinate before leaving the room and completes a toilet transfer with RW & grab bars  from recliner with SBA, pt self manages pants & hygiene.  Pt demonstrates sit to stand from multiple surfaces with RW and SBA for safety.  PT instructs pt in w/c to bed transfer with SBA and sit to supine using scooting method in bed req SBA with increased time.    Community Integration: PT instructs pt in w/c propulsion in community environment req SBA and verbal cues for w/c management including: on/off elevator, on various types of floor (tile,  carpet, concrete, brick), uneven slope, and in negotiating w/c in small spaces (gift shop).    Pt was limited by cardiac comorbidity of a-fib during AM session, but was motivated and felt ok enough to participate in bed level exercises. During PM session, pt continues to be in a-fib, but a lesser range (60's-90's bpm), closely monitored by PT using pulse oximeter. Continue per PT POC.   Therapy Documentation Precautions:  Precautions Precautions: Fall Precaution Comments: Fall Restrictions Weight Bearing Restrictions: No LLE Weight Bearing: Weight bearing as tolerated Vital Signs: AM Therapy Vitals Pulse Rate: (!) 121 (in sit, at rest) BP: (!) 139/115 mmHg (in sit, at rest) Patient Position (if appropriate): Sitting Oxygen Therapy SpO2: 96 % O2 Device: Not Delivered Pain: Pain Assessment Pain Assessment: 0-10 Pain Score: 2  Pain Type: Acute pain Pain Location: Pelvis Pain Orientation: Left Pain Descriptors / Indicators: Aching Pain Onset: On-going Patients Stated Pain Goal: 3 Pain Intervention(s): Medication (See eMAR) (premedicated) Multiple Pain Sites: No  Treatment Session 2: Pt c/o 3/10 pain in L pelvis at rest; PT uses emotional support to decrease pain.    See FIM for current functional status  Therapy/Group: Individual Therapy  Kamuela Magos M 09/23/2014, 8:44 AM

## 2014-09-23 NOTE — Progress Notes (Signed)
Occupational Therapy Session Note  Patient Details  Name: [name] MRN: [id number] Date of Birth: 1925-09-19  Today's Date: 09/23/2014 OT Individual Time: 1130-1153 OT Individual Time Calculation (min): 23 min   Missed 7 min due to fatigue.   Short Term Goals: Week 1:  OT Short Term Goal 1 (Week 1): Patient will complete toilet transfers with min assist. OT Short Term Goal 1 - Progress (Week 1): Met OT Short Term Goal 2 (Week 1): Patient will complete LB dressing using AE with min assist. OT Short Term Goal 2 - Progress (Week 1): Met OT Short Term Goal 3 (Week 1): Patient will complete toileting with min assist. OT Short Term Goal 3 - Progress (Week 1): Met OT Short Term Goal 4 (Week 1): Patient will complete toilet transfers via stand step ambulation with RW with min assist. OT Short Term Goal 4 - Progress (Week 1): Met OT Short Term Goal 5 (Week 1): Patient will complete shower transfers via stand step ambulation with RW with min assist. OT Short Term Goal 5 - Progress (Week 1): Met  Skilled Therapeutic Interventions/Progress Updates:    1:1 Pt in bed asleep when arrived. Pt declined participating in out of room activity today due to "heart being out of rhythm" - pt reported. Pt agreeable to get out of bed and get up for lunch. Practiced getting out of bed on right side with extra time from a semi flat bed. Pt able to demonstrate safe RW use during functional ambulation from bed to bathroom; including side stepping in narrow passageway. Pt performed toileting with steady A and transfers with supervision. Pt able to ambulate to sink with RW with supervision to wash her hands and then transitioned into the recliner with w/c cushion in it.   Therapy Documentation Precautions:  Precautions Precautions: Fall Precaution Comments: Fall Restrictions Weight Bearing Restrictions: No LLE Weight Bearing: Weight bearing as tolerated Pain: No c/o during session but did report extreme  fatigue.  See FIM for current functional status  Therapy/Group: Individual Therapy  Willeen Cass [facility] 09/23/2014, 2:00 PM

## 2014-09-23 NOTE — Progress Notes (Signed)
81 y.o. female with history of A Fib--on Pradaxa, h/o melanoma R-eye, PPM who lost her balance and fell onto her left hip and struck her left eye on 09/14/14.  Patient reported that last 3 weeks have been busy and hectic due to multiple appts to get her husband ready for AVR in the future.  Patient with subsequent complains of left groin pain, chest pain and back pain.  She was noted to have left forehead contusion and X rays of left hip revealing mildly displaced left inferior and left superior pubic rami fracture with extraperitoneal pelvic hematoma and stable 3 cm infrarenal AAA. She was evaluated by Dr. Lyla Glassing who recommended WBAT with walker  Subjective/Complaints: No new issues. Had another good day.     ROS-  Objective: Vital Signs: Blood pressure 149/79, pulse 106, temperature 97.6 F (36.4 C), temperature source Oral, resp. rate 23, height 5' 2"  (1.575 m), weight 71.714 kg (158 lb 1.6 oz), SpO2 100 %. No results found. Results for orders placed or performed during the hospital encounter of 09/16/14 (from the past 72 hour(s))  Magnesium     Status: None   Collection Time: 09/22/14  5:00 AM  Result Value Ref Range   Magnesium 2.2 1.7 - 2.4 mg/dL  Basic metabolic panel     Status: None   Collection Time: 09/22/14  6:15 AM  Result Value Ref Range   Sodium 138 135 - 145 mmol/L   Potassium 4.2 3.5 - 5.1 mmol/L   Chloride 106 101 - 111 mmol/L   CO2 24 22 - 32 mmol/L   Glucose, Bld 91 65 - 99 mg/dL   BUN 14 6 - 20 mg/dL   Creatinine, Ser 0.72 0.44 - 1.00 mg/dL   Calcium 8.9 8.9 - 10.3 mg/dL   GFR calc non Af Amer >60 >60 mL/min   GFR calc Af Amer >60 >60 mL/min    Comment: (NOTE) The eGFR has been calculated using the CKD EPI equation. This calculation has not been validated in all clinical situations. eGFR's persistently <60 mL/min signify possible Chronic Kidney Disease.    Anion gap 8 5 - 15     HEENT: normal Cardio: RRR and no murmur today Resp: CTA B/L and  unlabored GI: BS positive and NT, ND Extremity:  Edema LLE Skin:   Bruise Left periorbital and left hip/flank. No bruising on chest. Neuro: Alert/Oriented, Anxious and Abnormal Motor 5/5 in BUE, 4/5 RLE, 2- Left HF, KE, 3- ankle Musc/Skel:  Normal and Swelling Left hip and leg, mild, non tender. Left chest wall still tender but improved  -mild knee tenderness  Gen NAD   Assessment/Plan: 1. Functional deficits secondary to Left superior pubic ramus fx s/p fall 09/14/14 which require 3+ hours per day of interdisciplinary therapy in a comprehensive inpatient rehab setting. Physiatrist is providing close team supervision and 24 hour management of active medical problems listed below. Physiatrist and rehab team continue to assess barriers to discharge/monitor patient progress toward functional and medical goals. FIM: FIM - Bathing Bathing Steps Patient Completed: Chest, Right Arm, Left Arm, Abdomen, Front perineal area, Buttocks, Right upper leg, Left upper leg, Right lower leg (including foot), Left lower leg (including foot) Bathing: 5: Supervision: Safety issues/verbal cues  FIM - Upper Body Dressing/Undressing Upper body dressing/undressing steps patient completed: Thread/unthread right bra strap, Thread/unthread left bra strap, Thread/unthread right sleeve of pullover shirt/dresss, Hook/unhook bra, Thread/unthread left sleeve of pullover shirt/dress, Put head through opening of pull over shirt/dress, Pull shirt over trunk  Upper body dressing/undressing: 5: Set-up assist to: Obtain clothing/put away FIM - Lower Body Dressing/Undressing Lower body dressing/undressing steps patient completed: Thread/unthread right underwear leg, Thread/unthread left underwear leg, Pull underwear up/down, Thread/unthread right pants leg, Thread/unthread left pants leg, Pull pants up/down, Fasten/unfasten pants, Don/Doff right sock, Don/Doff left sock, Don/Doff right shoe, Don/Doff left shoe, Fasten/unfasten right  shoe, Fasten/unfasten left shoe Lower body dressing/undressing: 4: Min-Patient completed 75 plus % of tasks  FIM - Toileting Toileting steps completed by patient: Adjust clothing prior to toileting, Performs perineal hygiene, Adjust clothing after toileting Toileting: 4: Steadying assist  FIM - Radio producer Devices: Engineer, civil (consulting), Insurance account manager Transfers: 4-To toilet/BSC: Min A (steadying Pt. > 75%), 4-From toilet/BSC: Min A (steadying Pt. > 75%)  FIM - Bed/Chair Transfer Bed/Chair Transfer Assistive Devices: Walker, Arm rests Bed/Chair Transfer: 5: Sit > Supine: Supervision (verbal cues/safety issues), 5: Bed > Chair or W/C: Supervision (verbal cues/safety issues), 5: Chair or W/C > Bed: Supervision (verbal cues/safety issues)  FIM - Locomotion: Wheelchair Distance: 300 Locomotion: Wheelchair: 6: Travels 150 ft or more, turns around, maneuvers to table, bed or toilet, negotiates 3% grade: maneuvers on rugs and over door sills independently FIM - Locomotion: Ambulation Locomotion: Ambulation Assistive Devices: Administrator Ambulation/Gait Assistance: 5: Supervision Locomotion: Ambulation: 2: Travels 50 - 149 ft with supervision/safety issues  Comprehension Comprehension Mode: Auditory Comprehension: 7-Follows complex conversation/direction: With no assist  Expression Expression Mode: Verbal Expression: 7-Expresses complex ideas: With no assist  Social Interaction Social Interaction: 7-Interacts appropriately with others - No medications needed.  Problem Solving Problem Solving: 7-Solves complex problems: Recognizes & self-corrects  Memory Memory: 7-Complete Independence: No helper  Medical Problem List and Plan: 1. Functional deficits secondary to Left superior and inferior pubic rami fracture with extraperitoneal hematoma and severe left groin pain with movement.   2.  DVT Prophylaxis/Anticoagulation: Pharmaceutical: Pradexa 3. Pain  Management: Vicodin effective in pain management.  chest contusion, no rib fx on Xray, lidoderm helpful  -ice to chest/hip as needed  -  voltaren gel to knees (has GERD) was very helpful---would like to go home with this 4. Mood: LCSW to follow for evaluation and support.   5. Neuropsych: This patient is capable of making decisions on her own behalf. 6. Skin/Wound Care: Routine pressure relief measures. Monitor hematoma left eyebrow.   7. Fluids/Electrolytes/Nutrition: Monitor I/O. Offer supplements prn poor intake. Check lytes    8.  Extraperitoneal hematoma: Monitor H/H with serial labs    9.  PAF: Will monitor HR tid and watch for recurrent tachycardia. Continue Tikosyn and metoprolol current dose. Multiple interactions with tikosyn-- limit SSRI, anti emetic use  -appreciate cards follow up 10. COPD?: Reports h/o bronchitis and allergies--denies any respiratory issues.    11. HTN: Monitor with tid checks. Continue Metoprolol.  12. Hyperkalemia:  resolved 13. Leucocytosis: ua neg, cx with multispecies, afebrile 14. Chronic anorexia: will add nutritional supplements. Cont  Carafate, added pepcid   LOS (Days) 7 A FACE TO FACE EVALUATION WAS PERFORMED  [name] T 09/23/2014, 7:04 AM

## 2014-09-23 NOTE — Progress Notes (Signed)
Occupational Therapy Weekly Progress Note  Patient Details  Name: [name] MRN: [id number] Date of Birth: [birth year]  Beginning of progress report period: Sep 16, 2014 End of progress report period: Sep 23, 2014  Today's Date: 09/23/2014 OT Individual Time: 4650-3546 OT Individual Time Calculation (min): 60 min   Patient has met 5 of 5 short term goals.  Pt has made excellent progress managing her chronic symptoms of A-Fib while advancing in treatment.   Pt uses AE effectively with intermittent vc for technique and problem-solving.  Patient continues to demonstrate the following deficits: Impaired dynamic standing balance, impaired endurance, impaired functional mobility, left hip pain and therefore will continue to benefit from skilled OT intervention to enhance overall performance with BADL and iADL.  Patient progressing toward long term goals..  Continue plan of care.  OT Short Term Goals Week 1:  OT Short Term Goal 1 (Week 1): Patient will complete toilet transfers with min assist. OT Short Term Goal 1 - Progress (Week 1): Met OT Short Term Goal 2 (Week 1): Patient will complete LB dressing using AE with min assist. OT Short Term Goal 2 - Progress (Week 1): Met OT Short Term Goal 3 (Week 1): Patient will complete toileting with min assist. OT Short Term Goal 3 - Progress (Week 1): Met OT Short Term Goal 4 (Week 1): Patient will complete toilet transfers via stand step ambulation with RW with min assist. OT Short Term Goal 4 - Progress (Week 1): Met OT Short Term Goal 5 (Week 1): Patient will complete shower transfers via stand step ambulation with RW with min assist. OT Short Term Goal 5 - Progress (Week 1): Met Week 2:  OT Short Term Goal 1 (Week 2): STG=LTG d/t anticipated brief remaining LOS  Skilled Therapeutic Interventions/Progress Updates: ADL-retraining with focus on energy conservation, adapted bathing/dressing skills, AE reinforcement training, and discharge planning.    Pt received seated in her recliner awaiting therapist and reporting ongoing symptoms of A-Fib, self-monitoring as per PTA.   From w/c, with extra time and setup with clothing and supplies at sink, pt bathed upper and lower body, using LH sponge to wash back and feet and stood supported at sink to wash buttocks and peri-area.   Pt rested and progressed through dressing task using reacher and sock aid with min vc to problem-solve.   Pt deferred grooming and was escorted to ADL kitchen for orientation to planned kitchen safety task with use of RW.   Pt retrieved butter and eggs from refrigerator with standby assist, using countertops for transfer of supplies with min vc for safety.   Pt was escorted back to her and remained in w/c with all needs within reach.    Therapy Documentation Precautions:  Precautions Precautions: Fall Precaution Comments: Fall Restrictions Weight Bearing Restrictions: No LLE Weight Bearing: Weight bearing as tolerated   Pain: Pain Assessment Pain Assessment: 0-10 Pain Score: 2  Pain Type: Acute pain Pain Location: Hip Pain Orientation: Left Pain Descriptors / Indicators: Aching Pain Onset: With Activity Patients Stated Pain Goal: 3 Pain Intervention(s):  (Other (Comment); Pain medicine not due at this time, and patient states, "doesn't need anything for pain at this time.") Multiple Pain Sites: No   See FIM for current functional status  Therapy/Group: Individual Therapy  [place] 09/23/2014, 9:30 AM

## 2014-09-24 ENCOUNTER — Inpatient Hospital Stay (HOSPITAL_COMMUNITY): Payer: Medicare Other | Admitting: Physical Therapy

## 2014-09-24 NOTE — Progress Notes (Signed)
81 y.o. female with history of A Fib--on Pradaxa, h/o melanoma R-eye, PPM who lost her balance and fell onto her left hip and struck her left eye on 09/14/14.  Patient reported that last 3 weeks have been busy and hectic due to multiple appts to get her husband ready for AVR in the future.  Patient with subsequent complains of left groin pain, chest pain and back pain.  She was noted to have left forehead contusion and X rays of left hip revealing mildly displaced left inferior and left superior pubic rami fracture with extraperitoneal pelvic hematoma and stable 3 cm infrarenal AAA. She was evaluated by Dr. Lyla Glassing who recommended WBAT with walker  Subjective/Complaints: Feels that heart is out of rhythm again. Comfortable at present     ROS-  Objective: Vital Signs: Blood pressure 151/81, pulse 94, temperature 98.2 F (36.8 C), temperature source Oral, resp. rate 18, height 5' 2"  (1.575 m), weight 70.262 kg (154 lb 14.4 oz), SpO2 96 %. No results found. Results for orders placed or performed during the hospital encounter of 09/16/14 (from the past 72 hour(s))  Magnesium     Status: None   Collection Time: 09/22/14  5:00 AM  Result Value Ref Range   Magnesium 2.2 1.7 - 2.4 mg/dL  Basic metabolic panel     Status: None   Collection Time: 09/22/14  6:15 AM  Result Value Ref Range   Sodium 138 135 - 145 mmol/L   Potassium 4.2 3.5 - 5.1 mmol/L   Chloride 106 101 - 111 mmol/L   CO2 24 22 - 32 mmol/L   Glucose, Bld 91 65 - 99 mg/dL   BUN 14 6 - 20 mg/dL   Creatinine, Ser 0.72 0.44 - 1.00 mg/dL   Calcium 8.9 8.9 - 10.3 mg/dL   GFR calc non Af Amer >60 >60 mL/min   GFR calc Af Amer >60 >60 mL/min    Comment: (NOTE) The eGFR has been calculated using the CKD EPI equation. This calculation has not been validated in all clinical situations. eGFR's persistently <60 mL/min signify possible Chronic Kidney Disease.    Anion gap 8 5 - 15     HEENT: normal Cardio: RRR in mid 60's and no  murmur today Resp: CTA B/L and unlabored GI: BS positive and NT, ND Extremity:  Edema LLE Skin:   Bruise Left periorbital and left hip/flank. No bruising on chest. Neuro: Alert/Oriented, Anxious and Abnormal Motor 5/5 in BUE, 4/5 RLE, 2- Left HF, KE, 3- ankle Musc/Skel:  Normal and Swelling Left hip and leg, mild, non tender. Left chest wall still tender but improved  -mild knee tenderness  Gen NAD   Assessment/Plan: 1. Functional deficits secondary to Left superior pubic ramus fx s/p fall 09/14/14 which require 3+ hours per day of interdisciplinary therapy in a comprehensive inpatient rehab setting. Physiatrist is providing close team supervision and 24 hour management of active medical problems listed below. Physiatrist and rehab team continue to assess barriers to discharge/monitor patient progress toward functional and medical goals. FIM: FIM - Bathing Bathing Steps Patient Completed: Chest, Right Arm, Left Arm, Abdomen, Front perineal area, Buttocks, Right upper leg, Left upper leg, Right lower leg (including foot), Left lower leg (including foot) Bathing: 5: Supervision: Safety issues/verbal cues  FIM - Upper Body Dressing/Undressing Upper body dressing/undressing steps patient completed: Thread/unthread right bra strap, Thread/unthread left bra strap, Thread/unthread right sleeve of pullover shirt/dresss, Hook/unhook bra, Thread/unthread left sleeve of pullover shirt/dress, Put head through opening of  pull over shirt/dress, Pull shirt over trunk Upper body dressing/undressing: 5: Set-up assist to: Obtain clothing/put away FIM - Lower Body Dressing/Undressing Lower body dressing/undressing steps patient completed: Thread/unthread right underwear leg, Thread/unthread left underwear leg, Pull underwear up/down, Thread/unthread right pants leg, Thread/unthread left pants leg, Pull pants up/down, Fasten/unfasten pants, Don/Doff right sock, Don/Doff left sock, Don/Doff right shoe, Don/Doff  left shoe, Fasten/unfasten right shoe, Fasten/unfasten left shoe Lower body dressing/undressing: 4: Min-Patient completed 75 plus % of tasks  FIM - Toileting Toileting steps completed by patient: Adjust clothing prior to toileting, Performs perineal hygiene, Adjust clothing after toileting Toileting: 4: Steadying assist  FIM - Radio producer Devices: Engineer, civil (consulting), Insurance account manager Transfers: 4-To toilet/BSC: Min A (steadying Pt. > 75%), 4-From toilet/BSC: Min A (steadying Pt. > 75%)  FIM - Bed/Chair Transfer Bed/Chair Transfer Assistive Devices: Walker, Arm rests Bed/Chair Transfer: 5: Chair or W/C > Bed: Supervision (verbal cues/safety issues), 5: Sit > Supine: Supervision (verbal cues/safety issues)  FIM - Locomotion: Wheelchair Distance: 300 Locomotion: Wheelchair: 6: Travels 150 ft or more, turns around, maneuvers to table, bed or toilet, negotiates 3% grade: maneuvers on rugs and over door sills independently FIM - Locomotion: Ambulation Locomotion: Ambulation Assistive Devices: Administrator Ambulation/Gait Assistance: 5: Supervision Locomotion: Ambulation: 2: Travels 50 - 149 ft with supervision/safety issues  Comprehension Comprehension Mode: Auditory Comprehension: 7-Follows complex conversation/direction: With no assist  Expression Expression Mode: Verbal Expression: 7-Expresses complex ideas: With no assist  Social Interaction Social Interaction: 7-Interacts appropriately with others - No medications needed.  Problem Solving Problem Solving: 7-Solves complex problems: Recognizes & self-corrects  Memory Memory: 7-Complete Independence: No helper  Medical Problem List and Plan: 1. Functional deficits secondary to Left superior and inferior pubic rami fracture with extraperitoneal hematoma and severe left groin pain with movement.   2.  DVT Prophylaxis/Anticoagulation: Pharmaceutical: Pradexa 3. Pain Management: Vicodin effective in  pain management.  chest contusion, no rib fx on Xray, lidoderm helpful  -ice to chest/hip as needed  -  voltaren gel to knees (has GERD) was very helpful---would like to go home with this 4. Mood: LCSW to follow for evaluation and support.   5. Neuropsych: This patient is capable of making decisions on her own behalf. 6. Skin/Wound Care: Routine pressure relief measures. Monitor hematoma left eyebrow.   7. Fluids/Electrolytes/Nutrition: Monitor I/O. Offer supplements prn poor intake. Check lytes    8.  Extraperitoneal hematoma: Monitor H/H with serial labs    9.  PAF: Will monitor HR tid and watch for recurrent tachycardia. Continue Tikosyn and metoprolol current dose. Multiple interactions with tikosyn-- limit SSRI, anti emetic use  -appreciate cards follow up  -pt will have to relatively pace herself on days when she's out of rhythm  10. COPD?: Reports h/o bronchitis and allergies--denies any respiratory issues.    11. HTN: Monitor with tid checks. Continue Metoprolol.  12. Hyperkalemia:  resolved 13. Leucocytosis: ua neg, cx with multispecies, afebrile 14. Chronic anorexia: encourage po   LOS (Days) 8 A FACE TO FACE EVALUATION WAS PERFORMED  SWARTZ,ZACHARY T 09/24/2014, 7:16 AM

## 2014-09-24 NOTE — Progress Notes (Signed)
Physical Therapy Session Note  Patient Details  Name: [name] MRN: [id number] Date of Birth: [birth year]  Today's Date: 09/24/2014 PT Individual Time: 0900-1000 PT Individual Time Calculation (min): 60 min   Short Term Goals: Week 2:  PT Short Term Goal 1 (Week 2): STGs = LTGs due to ELOS  Skilled Therapeutic Interventions/Progress Updates:    Therapeutic Activity: Pt demonstrates supine to sit transfer via modified long sit to R side of bed with SBA and increased time. Pt is able to gather clothes from close by dresser with reacher, undress pajamas and dress upper body mod I, but req SBA for safety to dress lower body. Pt then completes toilet transfer with RW with supervision - self managing pants & hygiene. Pt completes washing hands in stand, combing hair, at sink with supervision PT instructs pt in car transfer with RW req SBA and verbal cues for hand placement.   Gait Training: PT instructs pt in ambulation with RW x 180' req SBA for safety and verbal cues to depress shoulders and look up.  PT instructs pt in ascending/descending 5 steps with B handrails req close SBA for safety.    Pt reports heart is "back in rhythm" today, and demonstrates continued progress with functional mobility. Continue per PT POC.    Therapy Documentation Precautions:  Precautions Precautions: Fall Precaution Comments: Fall Restrictions Weight Bearing Restrictions: No LLE Weight Bearing: Weight bearing as tolerated Pain: Pain Assessment Pain Assessment: 0-10 Pain Score: 9  Pain Type: Acute pain Pain Location: Pelvis Pain Orientation: Left Pain Descriptors / Indicators: Aching Pain Onset: With Activity Pain Intervention(s): Repositioned;Rest Multiple Pain Sites: No  See FIM for current functional status  Therapy/Group: Individual Therapy  [name] M 09/24/2014, 7:50 AM

## 2014-09-25 ENCOUNTER — Inpatient Hospital Stay (HOSPITAL_COMMUNITY): Payer: Medicare Other

## 2014-09-25 NOTE — Progress Notes (Signed)
Physical Therapy Session Note  Patient Details  Name: [name] MRN: [id number] Date of Birth: 1925-09-30  Today's Date: 09/25/2014 PT Individual Time: 1400-1500 PT Individual Time Calculation (min): 60 min   Short Term Goals: Week 2:  PT Short Term Goal 1 (Week 2): STGs = LTGs due to ELOS  Skilled Therapeutic Interventions/Progress Updates:    Session focused on bed mobility, LLE ROM/strength, endurance. Pt ambulated to/from rehab gym w/ S-SBA for safety, pt ambulated up/down ramp and on/off of curb with SBA w/ cueing for RW placement. Blocked practice x5 supine<>sit w/ bed set up as at home, required MinA one time when moving sit>supine to bring LLe onto bed. In supine instructed pt in gentle AROM exercises w/ use of MaxiSlide to decrease friction 2x10 heel slides then 2x10 supine hip abduction/adduction. Instructed pt in use of Nustep for AAROM to L hip 8' on L1 w/ UE/LE with pt controlling L hip flexion. Pt with some increase in pain with supine<>sit but none with Nustep. Pt allowed rest breaks between bouts of exercise for pain management and due to decreased endurance. Session ended in pt's room, where pt was left seated in recliner w/ all needs within reach.   Therapy Documentation Precautions:  Precautions Precautions: Fall Precaution Comments: Fall Restrictions Weight Bearing Restrictions: No LLE Weight Bearing: Weight bearing as tolerated Pain: Pain Assessment Pain Score: 3   Up to 10/10 with activity, diminished with rest  See FIM for current functional status  Therapy/Group: Individual Therapy  Rada Hay  Rada Hay, PT, DPT 09/25/2014, 7:36 AM

## 2014-09-26 ENCOUNTER — Inpatient Hospital Stay (HOSPITAL_COMMUNITY): Payer: Medicare Other

## 2014-09-26 ENCOUNTER — Inpatient Hospital Stay (HOSPITAL_COMMUNITY): Payer: Medicare Other | Admitting: Physical Therapy

## 2014-09-26 MED ORDER — MUSCLE RUB 10-15 % EX CREA
TOPICAL_CREAM | CUTANEOUS | Status: DC | PRN
  Filled 2014-09-26: qty 85

## 2014-09-26 NOTE — Progress Notes (Signed)
Occupational Therapy Session Note  Patient Details  Name: [name] MRN: [id number] Date of Birth: 1925-07-04  Today's Date: 09/26/2014 OT Individual Time: 4098-1191 OT Individual Time Calculation (min): 60 min   Short Term Goals: Week 2:  OT Short Term Goal 1 (Week 2): STG=LTG d/t anticipated brief remaining LOS  Skilled Therapeutic Interventions/Progress Updates: ADL-retraining with focus on pain management, functional mobility using RW, transfers, AE reinforcement training (reacher, LH sponge, sock aid, elastic shoe laces).    Pt received reporting ongoing symptoms of A-fib, which she monitors and accomodates using rest breaks during BADL.   Pt requested shower level bathing this session and she performed both bathing and dressing while seated on tub bench with only setup assist to provide supplies.   Pt donned socks and shoes at edge of bed with min vc for problem-solving while using sock aid.     Pt returned to w/c at end of session and groomed at sink unassisted while seated.     Therapy Documentation Precautions:  Precautions Precautions: Fall Precaution Comments: Fall Restrictions Weight Bearing Restrictions: No LLE Weight Bearing: Weight bearing as tolerated  Vital Signs: Therapy Vitals Temp: 98.2 F (36.8 C) Temp Source: Oral Pulse Rate: 72 Resp: 18 BP: (!) 144/75 mmHg Patient Position (if appropriate): Lying Oxygen Therapy SpO2: 96 % O2 Device: Not Delivered   Pain: Pain Assessment Pain Assessment: 0-10 Pain Score: 3  Pain Type: Acute pain Pain Location: Pelvis Pain Intervention(s): Medication (See eMAR);Shower Multiple Pain Sites: No  See FIM for current functional status  Therapy/Group: Individual Therapy   Second session: Time: 1400-1500 Time Calculation (min):  60 min  Pain Assessment: 3/10, pelvis  Skilled Therapeutic Interventions: Therapeutic activity with focus on functional mobility using RW, dynamic standing balance, endurance, and safety  awareness.   Pt received seated in her recliner with family present and receptive for planned session.    Pt ambulated to kitchen from her room and used RW to gather supplies and complete simple meal prep while standing.   Pt used countertops as advised to transfer items from refrigerator to stove and performed task of frying and egg and making a sandwich w/o complaint or evidence of LOB observed.  Total time standing was 18 minutes.   Pt rested briefly in w/c and was escorted to ortho gym.   Pt requested advancement of LE HEP and was educated on supported standing leg exercises using countertop (simulated) to perform leg raises (lateral, posterior, anterior) with knee straight and/or bent to reduce stress.   Pt reported enjoying T'ai Chi in the past and was educated on [place] slow crescent step and deep breathing  as similarly beneficial to promote stability and strengthening of LE while supported at [place].   Pt reports pain and weakness at LLE as limiting her ability to weight-shift but she nevertheless provides good effort during supervised exercises.   See FIM for current functional status  Therapy/Group: Individual Therapy  [place] 09/26/2014, 8:28 AM

## 2014-09-26 NOTE — Progress Notes (Signed)
Recreational Therapy Session Note  Patient Details  Name: Tyresha Fede MRN: [id number] Date of Birth: 09-16-1925 Today's Date: 09/26/2014   Pain: c/o hip pain, unrated, attempting to wait until closer to next tx session to request med Skilled Therapeutic Interventions/Progress Updates: Met with pt on 2 previous occasions in addition to today to discuss leisure interests and community pursuits.  Pt states limited ability to participate in activities of choice due to chronic cardiac issues.  Time spent discussing modifications and energy conservation techniques in which pt stated appreciation.  Pt is anxious to discharge home this week with plans to do as much as possible within her restrictions.  No further TR implemented.  [place] 09/26/2014, 11:57 AM

## 2014-09-26 NOTE — Progress Notes (Signed)
Physical Therapy Session Note  Patient Details  Name: [name] MRN: [id number] Date of Birth: 1926-03-30  Today's Date: 09/26/2014 PT Individual Time: 0900-0945 PT Individual Time Calculation (min): 45 min   Short Term Goals: Week 2:  PT Short Term Goal 1 (Week 2): STGs = LTGs due to ELOS  Skilled Therapeutic Interventions/Progress Updates:    Session focused on activity tolerance, bed mobility, L hip ROM. Pt reporting significant fatigue this AM due to episode of A-fib. Pt's HR during session in 80-85 BPM range but irregular. Pt elected to propel w/c to/from rehab gym vs walking due to fatigue. Instructed pt in stand pivot transfers w/ RW w/c<>mat table. Seated exercises for BLE at edge of mat for gentle AROM to L hip. Supine<>sit transfer w/ SBA and extra time to manage LLE on/off of bed. Pt with increased pain with all AROM to L hip compared to yesterday. Due to increased pain and fatigue, pt requested to return to room and lay down in bed to rest up for afternoon sessions. Session ended in pt's room, where pt was left supine in bed w/ all needs within reach. Pt missed 15 minutes of scheduled PT due to pain and fatigue.   Therapy Documentation Precautions:  Precautions Precautions: Fall Precaution Comments: Fall Restrictions Weight Bearing Restrictions: No LLE Weight Bearing: Weight bearing as tolerated Pain: Pain Assessment Pain Assessment: 0-10 Pain Score: 3, ranged up to 10 Pain Type: Acute pain Pain Location: Pelvis Pain Intervention(s): Rest, repositioned Multiple Pain Sites: No  See FIM for current functional status  Therapy/Group: Individual Therapy  Rada Hay  Rada Hay, PT, DPT 09/26/2014, 7:35 AM

## 2014-09-26 NOTE — Progress Notes (Addendum)
81 y.o. female with history of A Fib--on Pradaxa, h/o melanoma R-eye, PPM who lost her balance and fell onto her left hip and struck her left eye on 09/14/14.  Patient reported that last 3 weeks have been busy and hectic due to multiple appts to get her husband ready for AVR in the future.  Patient with subsequent complains of left groin pain, chest pain and back pain.  She was noted to have left forehead contusion and X rays of left hip revealing mildly displaced left inferior and left superior pubic rami fracture with extraperitoneal pelvic hematoma and stable 3 cm infrarenal AAA. She was evaluated by Dr. Lyla Glassing who recommended WBAT with walker  Subjective/Complaints: Complains of pain along left lower ribs.    ROS-  Objective: Vital Signs: Blood pressure 144/75, pulse 72, temperature 98.2 F (36.8 C), temperature source Oral, resp. rate 18, height 5\' 2"  (1.575 m), weight 71.8 kg (158 lb 4.6 oz), SpO2 96 %. No results found. No results found for this or any previous visit (from the past 72 hour(s)).   HEENT: normal Cardio: IRR in mid 60's and no murmur today Resp: CTA B/L and unlabored GI: BS positive and NT, ND Extremity:  Edema LLE Skin:   Bruise Left periorbital and left hip/flank. No bruising on chest. Neuro: Alert/Oriented, Anxious and Abnormal Motor 5/5 in BUE, 4/5 RLE, 2- Left HF, KE, 3- ankle Musc/Skel:  Normal and Swelling Left hip and leg, mild, non tender. Left chest wall still tender but improved  -mild tenderness along left lower rib border Gen NAD   Assessment/Plan: 1. Functional deficits secondary to Left superior pubic ramus fx s/p fall 09/14/14 which require 3+ hours per day of interdisciplinary therapy in a comprehensive inpatient rehab setting. Physiatrist is providing close team supervision and 24 hour management of active medical problems listed below. Physiatrist and rehab team continue to assess barriers to discharge/monitor patient progress toward functional and  medical goals. FIM: FIM - Bathing Bathing Steps Patient Completed: Chest, Right Arm, Left Arm, Abdomen, Front perineal area, Buttocks, Right upper leg, Left upper leg, Right lower leg (including foot), Left lower leg (including foot) Bathing: 5: Supervision: Safety issues/verbal cues  FIM - Upper Body Dressing/Undressing Upper body dressing/undressing steps patient completed: Thread/unthread right bra strap, Thread/unthread left bra strap, Hook/unhook bra, Thread/unthread right sleeve of pullover shirt/dresss, Thread/unthread left sleeve of pullover shirt/dress, Put head through opening of pull over shirt/dress, Pull shirt over trunk Upper body dressing/undressing: 6: More than reasonable amount of time FIM - Lower Body Dressing/Undressing Lower body dressing/undressing steps patient completed: Thread/unthread left underwear leg, Thread/unthread right underwear leg, Pull underwear up/down, Thread/unthread right pants leg, Thread/unthread left pants leg, Pull pants up/down, Fasten/unfasten pants, Don/Doff right sock, Don/Doff left sock, Don/Doff right shoe, Don/Doff left shoe Lower body dressing/undressing: 5: Supervision: Safety issues/verbal cues  FIM - Toileting Toileting steps completed by patient: Adjust clothing prior to toileting, Performs perineal hygiene, Adjust clothing after toileting Toileting: 5: Supervision: Safety issues/verbal cues  FIM - Radio producer Devices: Bedside commode Toilet Transfers: 5-To toilet/BSC: Supervision (verbal cues/safety issues)  FIM - Control and instrumentation engineer Devices: Walker, Arm rests Bed/Chair Transfer: 5: Supine > Sit: Supervision (verbal cues/safety issues), 4: Sit > Supine: Min A (steadying pt. > 75%/lift 1 leg), 5: Bed > Chair or W/C: Supervision (verbal cues/safety issues), 5: Chair or W/C > Bed: Supervision (verbal cues/safety issues)  FIM - Locomotion: Wheelchair Distance: 150 Locomotion:  Wheelchair: 0: Activity did not occur  FIM - Locomotion: Ambulation Locomotion: Ambulation Assistive Devices: Administrator Ambulation/Gait Assistance: 5: Supervision Locomotion: Ambulation: 5: Travels 150 ft or more with supervision/safety issues  Comprehension Comprehension Mode: Auditory Comprehension: 7-Follows complex conversation/direction: With no assist  Expression Expression Mode: Verbal Expression: 7-Expresses complex ideas: With no assist  Social Interaction Social Interaction: 7-Interacts appropriately with others - No medications needed.  Problem Solving Problem Solving: 7-Solves complex problems: Recognizes & self-corrects  Memory Memory: 7-Complete Independence: No helper  Medical Problem List and Plan: 1. Functional deficits secondary to Left superior and inferior pubic rami fracture with extraperitoneal hematoma and severe left groin pain with movement.   2.  DVT Prophylaxis/Anticoagulation: Pharmaceutical: Pradexa 3. Pain Management: Vicodin effective in pain management.  chest contusion, no rib fx on Xray, lidoderm helpful  -ice to chest/hip as needed  -  voltaren gel to knees (has GERD) was very helpful---can go home with this  -muscle rub to left rib 4. Mood: LCSW to follow for evaluation and support.   5. Neuropsych: This patient is capable of making decisions on her own behalf. 6. Skin/Wound Care: Routine pressure relief measures. Monitor hematoma left eyebrow.   7. Fluids/Electrolytes/Nutrition: Monitor I/O. Offer supplements prn poor intake. Check lytes    8.  Extraperitoneal hematoma: Monitor H/H with serial labs    9.  PAF: Will monitor HR tid and watch for recurrent tachycardia. Continue Tikosyn and metoprolol current dose. Multiple interactions with tikosyn-- limit SSRI, anti emetic use  -appreciate cards follow up  -pt will have to relatively pace herself on days when she's out of rhythm  10. COPD?: Reports h/o bronchitis and allergies--denies any  respiratory issues.    11. HTN: Monitor with tid checks. Continue Metoprolol.  12. Hyperkalemia:  resolved 13. Leucocytosis: ua neg, cx with multispecies, afebrile 14. Chronic anorexia: encourage po   LOS (Days) 10 A FACE TO FACE EVALUATION WAS PERFORMED  SWARTZ,ZACHARY T 09/26/2014, 7:27 AM

## 2014-09-26 NOTE — Progress Notes (Signed)
Physical Therapy Session Note  Patient Details  Name: [name] MRN: [id number] Date of Birth: March 01, 1926  Today's Date: 09/26/2014 PT Individual Time: 1505-1530 PT Individual Time Calculation (min): 25 min   Short Term Goals: Week 2:  PT Short Term Goal 1 (Week 2): STGs = LTGs due to ELOS  Skilled Therapeutic Interventions/Progress Updates:   Session focused on functional ambulation, strengthening, and activity tolerance. Gait using RW 2 x 150 ft room <> therapy gym with supervision > mod I. Patient able to self monitor for taking standing rest breaks as needed due to fatigue. Patient performed sit <> stand transfers from a variety of surfaces with supervision and increased time. Patient negotiated up/down ramp and up/down curb step with initial demonstration and no cues needed for sequencing, supervision for safety. Performed NuStep using BUE/BLE at level 3 x 8 min for LLE strength/ROM. Patient left sitting in recliner with all needs within reach.   Therapy Documentation Precautions:  Precautions Precautions: Fall Precaution Comments: Fall Restrictions Weight Bearing Restrictions: No LLE Weight Bearing: Weight bearing as tolerated Pain: Pain Assessment Pain Score: 3  Locomotion : Ambulation Ambulation/Gait Assistance: 5: Supervision   See FIM for current functional status  Therapy/Group: Individual Therapy  Laretta Alstrom 09/26/2014, 3:45 PM

## 2014-09-27 ENCOUNTER — Inpatient Hospital Stay (HOSPITAL_COMMUNITY): Payer: Medicare Other

## 2014-09-27 ENCOUNTER — Inpatient Hospital Stay (HOSPITAL_COMMUNITY): Payer: Medicare Other | Admitting: Physical Therapy

## 2014-09-27 NOTE — Progress Notes (Signed)
Social Work Patient ID: [name], female   DOB: February 15, 1926, 81 y.o.   MRN: [id number]  Lowella Curb, LCSW Social Worker Signed  Patient Care Conference 09/27/2014  6:53 PM    Expand All Collapse All   Inpatient RehabilitationTeam Conference and Plan of Care Update Date: 09/27/2014   Time: 2:00 PM     Patient Name: [name]       Medical Record Number: [id number]  Date of Birth: [birth year] Sex: Female         Room/Bed: 4M04C/4M04C-01 Payor Info: Payor: MEDICARE / Plan: MEDICARE PART A AND B / Product Type: *No Product type* /    Admitting Diagnosis: pubic rami fx after fall   Admit Date/Time:  09/16/2014  3:23 PM Admission Comments: No comment available   Primary Diagnosis:  Fracture of left superior pubic ramus Principal Problem: Fracture of left superior pubic ramus    Patient Active Problem List     Diagnosis  Date Noted   .  Valvular heart disease     .  Abdominal aortic aneurysm     .  Chest pain, musculoskeletal  09/16/2014   .  Pelvic fracture  09/16/2014   .  Fracture of left superior pubic ramus  09/14/2014   .  Contusion of forehead  09/14/2014   .  Calculus of bile duct without mention of cholecystitis or obstruction  06/23/2013   .  Pacemaker  12/07/2012   .  Hyperlipidemia  12/07/2012   .  Cerebral embolism with cerebral infarction  01/22/2012   .  Chest pain of pericarditis  01/20/2012   .  Acute CHF, presumed secondary to Rt heart failure  01/20/2012   .  Near syncope  01/17/2012   .  Cardiac tamponade, recurrent episode, admitted 9/5, but now felt to be pericarditits  01/17/2012   .  PAF (paroxysmal atrial fibrillation)  01/05/2012   .  CAD, mild by cath 2001 and 09/04/11  09/05/2011   .  COPD, by CXR, followed by Dr Annamaria Boots  09/05/2011   .  Syncope, after NTG X 1 on admission  09/05/2011   .  Chest pain  09/03/2011   .  Supra theraputic INR 5.5 01/16/12  09/03/2011   .  HTN (hypertension)  09/03/2011   .  Nl coronaries 2001, low risk Myoview 2012  09/03/2011    .  Dyspnea on exertion, secondary to AF  09/03/2011   .  RBBB, intermittent  09/03/2011   .  Personal history of colonic polyps  06/21/2011   .  Right bundle branch block and left anterior fascicular block  03/19/2011   .  Allergic rhinitis due to pollen  07/17/2010   .  GERD  10/25/2009   .  ABDOMINAL PAIN-EPIGASTRIC  10/25/2009   .  Aneurysm of other visceral artery  03/30/2008   .  EUSTACHIAN TUBE DYSFUNCTION  11/26/2007   .  Persistent atrial fibrillation, failed DCCV 11/12, turned down for RFA  11/26/2007   .  DIVERTICULOSIS, COLON  07/03/2006     Expected Discharge Date: Expected Discharge Date: 09/29/14  Team Members Present: Physician leading conference: Dr. Alger Simons Social Worker Present: Lennart Pall, LCSW Nurse Present: Heather Roberts, RN PT Present: Canary Brim, Lorriane Shire, PT OT Present: Salome Spotted, OT;Jennifer Jolene Schimke, OT SLP Present: Weston Anna, SLP PPS Coordinator present : Daiva Nakayama, RN, CRRN        Current Status/Progress  Goal  Weekly Team Focus   Medical  pain better control. knows limits when heart is irregular   see prior  pain, heart rate rhythm mgt   Bowel/Bladder     Continent of bowel and bladder; LBM 5/16  Mod I  Maintain current regimen; monitor for s/s of constipation and treat as needed   Swallow/Nutrition/ Hydration               ADL's     Overall Supervision for BADL, Supervision for TTB transfer   Mod I for B & D, supervision for transfers   Functional mobility, pain managment, endurance, dynamic standing balance   Mobility     overall SBA with intermittent need for Min for bed mobility   overall mod (I) from w/c level and supervision for upright mobility  endurance, upright tolerance, pain management    Communication               Safety/Cognition/ Behavioral Observations              Pain     C/o pain in pelvic area and bilat knees- voltaren gel helps; vicodin 1 tab q4h prn; robaxin 500 q6h prn  <  4  Assess and treat for pain q shift and prn   Skin     Bruises to left forehead and face, lower abdomen, pelvis and left thigh  Mod I  Assess skin q shift and prn    Rehab Goals Patient on target to meet rehab goals: Yes *See Care Plan and progress notes for long and short-term goals.    Barriers to Discharge:  heart rhythm,      Possible Resolutions to Barriers:   activity mod/knows limits     Discharge Planning/Teaching Needs:   home with husband who can only provide supervision;  son also available to assist if needed       Team Discussion:    Making good progress;  afib chronic;  Pain decreased and tolerating much more activity.  Ready for d/c on Thursday and ramp in place.  No concerns   Revisions to Treatment Plan:    None    Continued Need for Acute Rehabilitation Level of Care: The patient requires daily medical management by a physician with specialized training in physical medicine and rehabilitation for the following conditions: Daily direction of a multidisciplinary physical rehabilitation program to ensure safe treatment while eliciting the highest outcome that is of practical value to the patient.: Yes Daily analysis of laboratory values and/or radiology reports with any subsequent need for medication adjustment of medical intervention for : Neurological problems;Marda Stalker 09/27/2014, 6:53 PM                 Lowella Curb, LCSW Social Worker Signed  Patient Care Conference 09/20/2014  3:53 PM    Expand All Collapse All   Inpatient RehabilitationTeam Conference and Plan of Care Update Date: 09/20/2014   Time: 2:10 PM     Patient Name: [name]       Medical Record Number: [id number]  Date of Birth: [birth year] Sex: Female         Room/Bed: 4M04C/4M04C-01 Payor Info: Payor: MEDICARE / Plan: MEDICARE PART A AND B / Product Type: *No Product type* /    Admitting Diagnosis: pubic rami fx after fall   Admit Date/Time:  09/16/2014  3:23 PM Admission  Comments: No comment available   Primary Diagnosis:  Fracture of left superior pubic ramus Principal Problem: Fracture of left superior pubic ramus  Patient Active Problem List     Diagnosis  Date Noted   .  Chest pain, musculoskeletal  09/16/2014   .  Pelvic fracture  09/16/2014   .  Fracture of left superior pubic ramus  09/14/2014   .  Contusion of forehead  09/14/2014   .  Calculus of bile duct without mention of cholecystitis or obstruction  06/23/2013   .  Pacemaker  12/07/2012   .  Hyperlipidemia  12/07/2012   .  Cerebral embolism with cerebral infarction  01/22/2012   .  Chest pain of pericarditis  01/20/2012   .  Acute CHF, presumed secondary to Rt heart failure  01/20/2012   .  Near syncope  01/17/2012   .  Cardiac tamponade, recurrent episode, admitted 9/5, but now felt to be pericarditits  01/17/2012   .  PAF (paroxysmal atrial fibrillation)  01/05/2012   .  CAD, mild by cath 2001 and 09/04/11  09/05/2011   .  COPD, by CXR, followed by Dr Annamaria Boots  09/05/2011   .  Syncope, after NTG X 1 on admission  09/05/2011   .  Chest pain  09/03/2011   .  Supra theraputic INR 5.5 01/16/12  09/03/2011   .  HTN (hypertension)  09/03/2011   .  Nl coronaries 2001, low risk Myoview 2012  09/03/2011   .  Dyspnea on exertion, secondary to AF  09/03/2011   .  RBBB, intermittent  09/03/2011   .  Personal history of colonic polyps  06/21/2011   .  Right bundle branch block and left anterior fascicular block  03/19/2011   .  Allergic rhinitis due to pollen  07/17/2010   .  GERD  10/25/2009   .  ABDOMINAL PAIN-EPIGASTRIC  10/25/2009   .  Aneurysm of other visceral artery  03/30/2008   .  EUSTACHIAN TUBE DYSFUNCTION  11/26/2007   .  Persistent atrial fibrillation, failed DCCV 11/12, turned down for RFA  11/26/2007   .  DIVERTICULOSIS, COLON  07/03/2006     Expected Discharge Date: Expected Discharge Date: 09/29/14  Team Members Present: Physician leading conference: Dr. Alger Simons Social Worker Present: Lennart Pall, LCSW Nurse Present: Heather Roberts, RN PT Present: Canary Brim, Lorriane Shire, PT OT Present: Salome Spotted, OT;Other (comment) Napoleon Form, OT) SLP Present: Weston Anna, SLP Other (Discipline and Name): Danne Baxter, RN [facility] [place] Coordinator present : Daiva Nakayama, RN, CRRN        Current Status/Progress  Goal  Weekly Team Focus   Medical     left pelvic fx after fall, knee pain bilaterally, afib with ?RVR   improve activity tolerance, pain control   ?rate control, symptom mgt   Bowel/Bladder     continent of bowel and bladder  remain continent of bowel and bladder   educate patient about the signs and symptoms of consipation   Swallow/Nutrition/ Hydration               ADL's     Overall min A for transfers and B & D  Mod I for B & D, supervision for transfers   Pain managmement, AE training, activity tolerance/endurance, functional transfers   Mobility     Mod for bed mobility, Min-Mod for sit>stand and Min in standing and for ambulation up to 10' w/ RW. Significantly limited by pain with any WBing through L  overall mod (I) from w/c level and supervision for upright mobility  WBing tolerance, pain management, mobility strategies to reduce  pain   Communication               Safety/Cognition/ Behavioral Observations              Pain     patient reports pain in the left pelvic area and bilateral knees  pain less than or equal to 4 on a scale of 0-10  assess for pain q4h and medicate as indicated, apply voltaren gel to bilateral knees per schedule   Skin     bruises to left forhead, around left eye, lower abdomen, left pelvis area and left thigh.  avoid any new skin injury/breakdown   assess skin q shif and prn.    Rehab Goals Patient on target to meet rehab goals: Yes *See Care Plan and progress notes for long and short-term goals.    Barriers to Discharge:  pain, ?tachycardia     Possible Resolutions to Barriers:   pain  control, mobility mod/equipment, ?adjusment to cardiac meds     Discharge Planning/Teaching Needs:   home with husband who can only provide supervision;  son also available to assist if needed       Team Discussion:    Working on pain control;  Experiencing afib today with SOB - pt very aware when this happens.  Pain is limiting what she can do in tx and need to focus on w/c level goals with stand-pivot transfers .  Hope for mod i w/c level.  Family will need to build ramp   Revisions to Treatment Plan:    None    Continued Need for Acute Rehabilitation Level of Care: The patient requires daily medical management by a physician with specialized training in physical medicine and rehabilitation for the following conditions: Daily direction of a multidisciplinary physical rehabilitation program to ensure safe treatment while eliciting the highest outcome that is of practical value to the patient.: Yes Daily medical management of patient stability for increased activity during participation in an intensive rehabilitation regime.: Yes Daily analysis of laboratory values and/or radiology reports with any subsequent need for medication adjustment of medical intervention for : Post surgical problems;Neurological problems  Sindhu Nguyen 09/20/2014, 6:06 PM

## 2014-09-27 NOTE — Progress Notes (Signed)
81 y.o. female with history of A Fib--on Pradaxa, h/o melanoma R-eye, PPM who lost her balance and fell onto her left hip and struck her left eye on 09/14/14.  Patient reported that last 3 weeks have been busy and hectic due to multiple appts to get her husband ready for AVR in the future.  Patient with subsequent complains of left groin pain, chest pain and back pain.  She was noted to have left forehead contusion and X rays of left hip revealing mildly displaced left inferior and left superior pubic rami fracture with extraperitoneal pelvic hematoma and stable 3 cm infrarenal AAA. She was evaluated by Dr. Lyla Glassing who recommended WBAT with walker  Subjective/Complaints: Feeling well. No new problems yesterday    ROS-  Objective: Vital Signs: Blood pressure 121/70, pulse 79, temperature 97.9 F (36.6 C), temperature source Oral, resp. rate 19, height 5\' 2"  (1.575 m), weight 69.5 kg (153 lb 3.5 oz), SpO2 95 %. No results found. No results found for this or any previous visit (from the past 72 hour(s)).   HEENT: normal Cardio: IRR in mid 80's and no murmur today Resp: CTA B/L and unlabored GI: BS positive and NT, ND Extremity:  Edema LLE Skin:   Bruise Left periorbital and left hip/flank. No bruising on chest. Neuro: Alert/Oriented, Anxious and Abnormal Motor 5/5 in BUE, 4/5 RLE, 2- Left HF, KE, 3- ankle Musc/Skel:  Normal and Swelling Left hip and leg, mild, non tender. Left chest wall still tender but improved  -mild tenderness along left lower rib border still present Gen NAD   Assessment/Plan: 1. Functional deficits secondary to Left superior pubic ramus fx s/p fall 09/14/14 which require 3+ hours per day of interdisciplinary therapy in a comprehensive inpatient rehab setting. Physiatrist is providing close team supervision and 24 hour management of active medical problems listed below. Physiatrist and rehab team continue to assess barriers to discharge/monitor patient progress toward  functional and medical goals. FIM: FIM - Bathing Bathing Steps Patient Completed: Chest, Left Arm, Right Arm, Abdomen, Front perineal area, Buttocks, Right upper leg, Left upper leg, Right lower leg (including foot), Left lower leg (including foot) Bathing: 6: Assistive device (Comment) (LH sponge)  FIM - Upper Body Dressing/Undressing Upper body dressing/undressing steps patient completed: Thread/unthread right bra strap, Thread/unthread left bra strap, Hook/unhook bra, Thread/unthread right sleeve of pullover shirt/dresss, Thread/unthread left sleeve of pullover shirt/dress, Put head through opening of pull over shirt/dress, Pull shirt over trunk Upper body dressing/undressing: 5: Set-up assist to: Obtain clothing/put away FIM - Lower Body Dressing/Undressing Lower body dressing/undressing steps patient completed: Thread/unthread right underwear leg, Thread/unthread left underwear leg, Pull underwear up/down, Thread/unthread right pants leg, Thread/unthread left pants leg, Pull pants up/down, Fasten/unfasten pants, Don/Doff right sock, Don/Doff left sock, Don/Doff right shoe, Don/Doff left shoe, Fasten/unfasten right shoe, Fasten/unfasten left shoe Lower body dressing/undressing: 6: Assistive device (Comment) (reacher, sock aid, elastic laces)  FIM - Toileting Toileting steps completed by patient: Adjust clothing prior to toileting, Performs perineal hygiene, Adjust clothing after toileting Toileting Assistive Devices: Grab bar or rail for support Toileting: 5: Supervision: Safety issues/verbal cues  FIM - Radio producer Devices: Recruitment consultant Transfers: 5-To toilet/BSC: Supervision (verbal cues/safety issues)  FIM - Control and instrumentation engineer Devices: Environmental consultant, Arm rests Bed/Chair Transfer: 5: Bed > Chair or W/C: Supervision (verbal cues/safety issues), 5: Chair or W/C > Bed: Supervision (verbal cues/safety issues)  FIM -  Locomotion: Wheelchair Distance: 150 Locomotion: Wheelchair: 0: Activity did not  occur FIM - Locomotion: Ambulation Locomotion: Ambulation Assistive Devices: Walker - Rolling Ambulation/Gait Assistance: 5: Supervision Locomotion: Ambulation: 5: Travels 150 ft or more with supervision/safety issues  Comprehension Comprehension Mode: Auditory Comprehension: 7-Follows complex conversation/direction: With no assist  Expression Expression Mode: Verbal Expression: 7-Expresses complex ideas: With no assist  Social Interaction Social Interaction: 7-Interacts appropriately with others - No medications needed.  Problem Solving Problem Solving: 7-Solves complex problems: Recognizes & self-corrects  Memory Memory: 7-Complete Independence: No helper  Medical Problem List and Plan: 1. Functional deficits secondary to Left superior and inferior pubic rami fracture with extraperitoneal hematoma and severe left groin pain with movement.   2.  DVT Prophylaxis/Anticoagulation: Pharmaceutical: Pradexa 3. Pain Management: Vicodin effective in pain management.  chest contusion, no rib fx on Xray, lidoderm helpful  -ice to chest/hip as needed  -  voltaren gel to knees (has GERD) was very helpful---can go home with this  -muscle rub to left ribs 4. Mood: LCSW to follow for evaluation and support.   5. Neuropsych: This patient is capable of making decisions on her own behalf. 6. Skin/Wound Care: Routine pressure relief measures. Monitor hematoma left eyebrow.   7. Fluids/Electrolytes/Nutrition: Monitor I/O. Offer supplements prn poor intake. Check lytes    8.  Extraperitoneal hematoma: Monitor H/H with serial labs    9.  PAF: Will monitor HR tid and watch for recurrent tachycardia. Continue Tikosyn and metoprolol current dose. Multiple interactions with tikosyn-- limit SSRI, anti emetic use  -appreciate cards follow up  10. COPD?: Reports h/o bronchitis and allergies--denies any respiratory issues.     11. HTN: Monitor with tid checks. Continue Metoprolol.  12. Hyperkalemia:  resolved 13. Leucocytosis: ua neg, cx with multispecies, afebrile 14. Chronic anorexia: encourage po   LOS (Days) 11 A FACE TO FACE EVALUATION WAS PERFORMED  Sharday Michl T 09/27/2014, 7:28 AM

## 2014-09-27 NOTE — Progress Notes (Signed)
Physical Therapy Session Note  Patient Details  Name: [name] MRN: [id number] Date of Birth: March 02, 1926  Today's Date: 09/27/2014 PT Individual Time: 0900-1000 PT Individual Time Calculation (min): 60 min   Session 2 Time: 1500-1600 Time Calculation (min): 60 min  Short Term Goals: Week 2:  PT Short Term Goal 1 (Week 2): STGs = LTGs due to ELOS  Skilled Therapeutic Interventions/Progress Updates:    Session 1: Session focused on L hip ROM, standing balance, car transfer. Pt ambulated to/from rehab gym w/ SBA w/ intermittent cueing for shoulder depression and upright posture. Instructed pt in car transfer to simulated sedan height car w/ overall SBA w/ assist for RW management. Instructed pt in Nustep L1 w/ UE/LE 10' for gentle patient controlled ROM to L hip, no complaints of increased pain. Instructed pt in standing balance exercises at parallel bars x10 each marches, heel/toe raises, hip abduction, and side stepping. Pt tolerated session well. Session ended in pt's room, where pt was left seated in recliner w/ all needs within reach.   Session 2: Session focused on ambulation, pelvic movement, LE strengthening. Pt's husband and dtr present at beginning of session to discuss discharge equipment. Recommend use of 18x16 wheelchair in pt's home due to inconsistent activity tolerance due to pt's A-Fib. On days when pt's heart is out of rhythm pt will have increased independence with mobility with use of w/c. Pt ambulated around room and to rehab gym w/ RW, extra time, and distant S w/ cueing as detailed above. Instructed pt in sit<>stands and standing hip ex's with graded UE support from therapist to allow for increased WBing through BLE for strengthening. Instructed pt in seated reaches max outside BOS laterally to allow for trunk shortening/lengthening and pelvic weight shifting within pt's pain tolerance. Pt tolerated session well. Session ended in pt's room, where pt was left seated in w/c w/  all needs within reach.   Therapy Documentation Precautions:  Precautions Precautions: Fall Precaution Comments: Fall Restrictions Weight Bearing Restrictions: Yes LLE Weight Bearing: Weight bearing as tolerated Pain: Pain Assessment Pain Assessment: 0-10 Pain Score: 4  Pain Location: Hip Pain Orientation: Left Pain Descriptors / Indicators: Throbbing;Aching Pain Frequency: Intermittent Pain Onset: Gradual Pain Intervention(s): Rest, Repositioned  See FIM for current functional status  Therapy/Group: Individual Therapy  Rada Hay  Rada Hay, PT, DPT 09/27/2014, 7:39 AM

## 2014-09-27 NOTE — Progress Notes (Signed)
Physical Therapy Session Note  Patient Details  Name: [name] MRN: [id number] Date of Birth: 10-13-1925  Today's Date: 09/27/2014 PT Individual Time: 1030-1104 PT Individual Time Calculation (min): 34 min   Short Term Goals: Week 1:  PT Short Term Goal 1 (Week 1): Pt will sit>stand from standard furniture w/ MinGuard A PT Short Term Goal 1 - Progress (Week 1): Met PT Short Term Goal 2 (Week 1): Pt will propel w/c 150' w/ BUE and mod (I) PT Short Term Goal 2 - Progress (Week 1): Met PT Short Term Goal 3 (Week 1): Pt will ambulate 51' w/ MinGuard A PT Short Term Goal 3 - Progress (Week 1): Met Week 2:  PT Short Term Goal 1 (Week 2): STGs = LTGs due to ELOS  Skilled Therapeutic Interventions/Progress Updates:   Pt received in recliner; reports minimal pain at rest.  Discussed with patient D/C, equipment needs and home set up.  Pt agreeable to practice gait over carpet to simulated bedroom at home.  Pt requesting to use toilet first.  Pt transferred from recliner > toilet with RW supervision and performed all toileting tasks mod I with extra time.  Pt stood at sink to wash hands and transferred to w/c supervision.  Performed w/c mobility in controlled environment with bilat UE propulsion supervision.  Performed gait over thicker carpet with RW and supervision with focus on safety with transition from smooth floor <> carpet, forwards, backwards and R and L lateral gait with verbal cues for safety, sequence and balance.  Pt returned to w/c and performed w/c mobility back to room supervision.  Pt transferred w/c > recliner stand pivot with RW supervision; pt left in recliner with all items within reach.     Therapy Documentation Precautions:  Precautions Precautions: Fall Precaution Comments: Fall Restrictions Weight Bearing Restrictions: No LLE Weight Bearing: Weight bearing as tolerated Pain: Pain Assessment Pain Assessment: No/denies pain Pain Score: 4  Pain Location: Pelvis Pain  Descriptors / Indicators: Aching Pain Frequency: Constant Pain Onset: With Activity Pain Intervention(s): Medication (See eMAR) Locomotion : Ambulation Ambulation/Gait Assistance: 5: Supervision (Simultaneous filing. User may not have seen previous data.) Wheelchair Mobility Distance: 150   See FIM for current functional status  Therapy/Group: Individual Therapy  Raylene Everts [facility] 09/27/2014, 12:23 PM

## 2014-09-27 NOTE — Patient Care Conference (Signed)
Inpatient RehabilitationTeam Conference and Plan of Care Update Date: 09/27/2014   Time: 2:00 PM    Patient Name: [name]      Medical Record Number: [id number]  Date of Birth: 01-10-1926 Sex: Female         Room/Bed: 4M04C/4M04C-01 Payor Info: Payor: MEDICARE / Plan: MEDICARE PART A AND B / Product Type: *No Product type* /    Admitting Diagnosis: pubic rami fx after fall  Admit Date/Time:  09/16/2014  3:23 PM Admission Comments: No comment available   Primary Diagnosis:  Fracture of left superior pubic ramus Principal Problem: Fracture of left superior pubic ramus  Patient Active Problem List   Diagnosis Date Noted  . Valvular heart disease   . Abdominal aortic aneurysm   . Chest pain, musculoskeletal 09/16/2014  . Pelvic fracture 09/16/2014  . Fracture of left superior pubic ramus 09/14/2014  . Contusion of forehead 09/14/2014  . Calculus of bile duct without mention of cholecystitis or obstruction 06/23/2013  . Pacemaker 12/07/2012  . Hyperlipidemia 12/07/2012  . Cerebral embolism with cerebral infarction 01/22/2012  . Chest pain of pericarditis 01/20/2012  . Acute CHF, presumed secondary to Rt heart failure 01/20/2012  . Near syncope 01/17/2012  . Cardiac tamponade, recurrent episode, admitted 9/5, but now felt to be pericarditits 01/17/2012  . PAF (paroxysmal atrial fibrillation) 01/05/2012  . CAD, mild by cath 2001 and 09/04/11 09/05/2011  . COPD, by CXR, followed by Dr Annamaria Boots 09/05/2011  . Syncope, after NTG X 1 on admission 09/05/2011  . Chest pain 09/03/2011  . Supra theraputic INR 5.5 01/16/12 09/03/2011  . HTN (hypertension) 09/03/2011  . Nl coronaries 2001, low risk Myoview 2012 09/03/2011  . Dyspnea on exertion, secondary to AF 09/03/2011  . RBBB, intermittent 09/03/2011  . Personal history of colonic polyps 06/21/2011  . Right bundle branch block and left anterior fascicular block 03/19/2011  . Allergic rhinitis due to pollen 07/17/2010  . GERD 10/25/2009  .  ABDOMINAL PAIN-EPIGASTRIC 10/25/2009  . 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 EUSTACHIAN TUBE DYSFUNCTION 11/26/2007  . Persistent atrial fibrillation, failed DCCV 11/12, turned down for RFA 11/26/2007  . DIVERTICULOSIS, COLON 07/03/2006    Expected Discharge Date: Expected Discharge Date: 09/29/14  Team Members Present: Physician leading conference: Dr. Alger Simons Social Worker Present: Lennart Pall, LCSW Nurse Present: Heather Roberts, RN PT Present: Canary Brim, Lorriane Shire, PT OT Present: Salome Spotted, OT;Jennifer Jolene Schimke, OT SLP Present: Weston Anna, SLP PPS Coordinator present : Daiva Nakayama, RN, CRRN     Current Status/Progress Goal Weekly Team Focus  Medical   pain better control. knows limits when heart is irregular  see prior  pain, heart rate rhythm mgt   Bowel/Bladder   Continent of bowel and bladder; LBM 5/16  Mod I  Maintain current regimen; monitor for s/s of constipation and treat as needed   Swallow/Nutrition/ Hydration             ADL's   Overall Supervision for BADL, Supervision for TTB transfer  Mod I for B & D, supervision for transfers  Functional mobility, pain managment, endurance, dynamic standing balance   Mobility   overall SBA with intermittent need for Min for bed mobility  overall mod (I) from w/c level and supervision for upright mobility  endurance, upright tolerance, pain management   Communication             Safety/Cognition/ Behavioral Observations  Pain   C/o pain in pelvic area and bilat knees- voltaren gel helps; vicodin 1 tab q4h prn; robaxin 500 q6h prn  < 4  Assess and treat for pain q shift and prn   Skin   Bruises to left forehead and face, lower abdomen, pelvis and left thigh  Mod I  Assess skin q shift and prn    Rehab Goals Patient on target to meet rehab goals: Yes *See Care Plan and progress notes for long and short-term goals.  Barriers to Discharge: heart rhythm,      Possible Resolutions to Barriers:  activity mod/knows limits    Discharge Planning/Teaching Needs:  home with husband who can only provide supervision;  son also available to assist if needed      Team Discussion:  Making good progress;  afib chronic;  Pain decreased and tolerating much more activity.  Ready for d/c on Thursday and ramp in place.  No concerns  Revisions to Treatment Plan:  None   Continued Need for Acute Rehabilitation Level of Care: The patient requires daily medical management by a physician with specialized training in physical medicine and rehabilitation for the following conditions: Daily direction of a multidisciplinary physical rehabilitation program to ensure safe treatment while eliciting the highest outcome that is of practical value to the patient.: Yes Daily analysis of laboratory values and/or radiology reports with any subsequent need for medication adjustment of medical intervention for : Neurological problems;Other  [name] 09/27/2014, 6:53 PM

## 2014-09-27 NOTE — Progress Notes (Signed)
Social Work Patient ID: [name], female   DOB: 1925-07-05, 81 y.o.   MRN: [id number]  Met with pt and family today to review team conference.  All pleased with progress and agree ready for d/c on Thursday. Reviewed follow up arrrangements and DME. No further concerns.  Abdulai Blaylock, LCSW

## 2014-09-27 NOTE — Progress Notes (Signed)
Occupational Therapy Session Note  Patient Details  Name: [name] MRN: [id number] Date of Birth: [birth year]  Today's Date: 09/27/2014 OT Individual Time: 8250-5397 OT Individual Time Calculation (min): 60 min    Short Term Goals: Week 2:  OT Short Term Goal 1 (Week 2): STG=LTG d/t anticipated brief remaining LOS  Skilled Therapeutic Interventions/Progress Updates: ADL-retraining with focus on improved functional mobility, dynamic standing balance, transfer, and adapted bathing/dressing skills.   Pt ambulates to tub room from her room with min guard assist and no LOB noted.   Pt transfers to TTB with standby assist only and bathes, sitting and standing to wash peri-area and buttocks, using LH sponge for back.    Pt dressed sitting on TTB with setup assist to provide clothing to conserve energy.    Pt reported fatigue and end of session and requested return to her room to don socks and shoes, using sock aid to don socks and reacher to retrieve shoes while sitting on recliner.    Pt requires overall supervision for mobility and transfers and uses her AE effectively to bathe and dress during this session.     Therapy Documentation Precautions:  Precautions Precautions: Fall Precaution Comments: Fall Restrictions Weight Bearing Restrictions: Yes LLE Weight Bearing: Weight bearing as tolerated  Vital Signs: Therapy Vitals Temp: 97.9 F (36.6 C) Temp Source: Oral Pulse Rate: 79 Resp: 19 BP: 121/70 mmHg Patient Position (if appropriate): Lying Oxygen Therapy SpO2: 95 % O2 Device: Not Delivered   Pain: Pain Assessment Pain Assessment: 0-10 Pain Score: 4  Pain Location: Pelvis Pain Orientation: Left Pain Descriptors / Indicators: Aching Pain Frequency: Constant Pain Onset: With Activity Pain Intervention(s): Medication (See eMAR)  See FIM for current functional status  Therapy/Group: Individual Therapy  [place] 09/27/2014, 8:29 AM

## 2014-09-28 ENCOUNTER — Inpatient Hospital Stay (HOSPITAL_COMMUNITY): Payer: Medicare Other | Admitting: Physical Therapy

## 2014-09-28 ENCOUNTER — Inpatient Hospital Stay (HOSPITAL_COMMUNITY): Payer: Medicare Other

## 2014-09-28 ENCOUNTER — Inpatient Hospital Stay (HOSPITAL_COMMUNITY): Payer: Medicare Other | Admitting: Occupational Therapy

## 2014-09-28 NOTE — Progress Notes (Addendum)
Social Work  Discharge Note  The overall goal for the admission was met for:   Discharge location: Yes - home with husband and family to provide assist as needed  Length of Stay: Yes - 13 days  Discharge activity level: Yes - modified independent  Home/community participation: Yes  Services provided included: MD, RD, PT, OT, RN, TR, Pharmacy and [place]: Medicare and Private Insurance: [place]  Follow-up services arranged: Home Health: RN, PT via [facility], DME: 18x16 lightweight w/c with basic cushion, 3n1 commode and tub bench all via [place] and Patient/Family has no preference for HH/DME agencies  Comments (or additional information):  Patient/Family verbalized understanding of follow-up arrangements: Yes  Individual responsible for coordination of the follow-up plan: pt  Confirmed correct DME delivered: HOYLE, LUCY 09/28/2014    HOYLE, LUCY

## 2014-09-28 NOTE — Progress Notes (Signed)
Physical Therapy Session Note  Patient Details  Name: [name] MRN: [id number] Date of Birth: 1926-05-12  Today's Date: 09/28/2014 PT Individual Time: 1400-1500 PT Individual Time Calculation (min): 60 min   Short Term Goals: Week 2:  PT Short Term Goal 1 (Week 2): STGs = LTGs due to ELOS  Skilled Therapeutic Interventions/Progress Updates:    Session focused on balance evaluation, ambulation, bed mobility. Pt ambulated around room through obstacles and over thresholds with RW and mod (I). Pt ambulated to gym w/ RW and mod (I) w/ extra time. Administered Berg Balance Test with pt scoring 28/56 placing pt into high falls risk category. Also administered Timed Up and Go Test w/ pt using RW and amblating at mod (I). Pt scores 40", 36", 34" (best score of 106" one week ago). Pt expressed worry about getting up out of bed quickly enough to use bathroom in the middle of the night. Therapist instructed pt in moving supine>sit from flat mat table, pt completed transfer w/ mod (I) in 45 seconds, and pt agreed she had more then 45 seconds of warning when she felt urinary urgency. Pt tolerated session well. Session ended in pt's room, where pt was left seated in recliner w/ all needs within reach.   Therapy Documentation Precautions:  Precautions Precautions: Fall Precaution Comments: Fall Restrictions Weight Bearing Restrictions: No LLE Weight Bearing: Weight bearing as tolerated Pain: Pain Assessment Pain Assessment: 0-10 Pain Score: 8  Pain Type: Acute pain Pain Location: Pelvis Pain Orientation: Left Pain Descriptors / Indicators: Aching Pain Frequency: Intermittent Pain Onset: On-going Patients Stated Pain Goal: 3 Pain Intervention(s): Rest, Repositioned  Balance: Standardized Balance Assessment Standardized Balance Assessment: Berg Balance Test Berg Balance Test Sit to Stand: Able to stand  independently using hands Standing Unsupported: Able to stand safely 2 minutes Sitting  with Back Unsupported but Feet Supported on Floor or Stool: Able to sit safely and securely 2 minutes Stand to Sit: Controls descent by using hands Transfers: Able to transfer with verbal cueing and /or supervision Standing Unsupported with Eyes Closed: Able to stand 10 seconds with supervision Standing Ubsupported with Feet Together: Needs help to attain position and unable to hold for 15 seconds From Standing, Reach Forward with Outstretched Arm: Can reach forward >12 cm safely (5") From Standing Position, Pick up Object from Floor: Unable to pick up and needs supervision From Standing Position, Turn to Look Behind Over each Shoulder: Turn sideways only but maintains balance Turn 360 Degrees: Needs assistance while turning Standing Unsupported, Alternately Place Feet on Step/Stool: Able to complete >2 steps/needs minimal assist Standing Unsupported, One Foot in Front: Able to take small step independently and hold 30 seconds Standing on One Leg: Unable to try or needs assist to prevent fall Total Score: 28 Timed Up and Go Test TUG: Normal TUG Normal TUG (seconds): 34  See FIM for current functional status  Therapy/Group: Individual Therapy  Rada Hay  Rada Hay, PT, DPT 09/28/2014, 7:40 AM

## 2014-09-28 NOTE — Progress Notes (Signed)
81 y.o. female with history of A Fib--on Pradaxa, h/o melanoma R-eye, PPM who lost her balance and fell onto her left hip and struck her left eye on 09/14/14.  Patient reported that last 3 weeks have been busy and hectic due to multiple appts to get her husband ready for AVR in the future.  Patient with subsequent complains of left groin pain, chest pain and back pain.  She was noted to have left forehead contusion and X rays of left hip revealing mildly displaced left inferior and left superior pubic rami fracture with extraperitoneal pelvic hematoma and stable 3 cm infrarenal AAA. She was evaluated by Dr. Lyla Glassing who recommended WBAT with walker  Subjective/Complaints: No complaints. Had a good day. Feeling stronger.  ROS-  Objective: Vital Signs: Blood pressure 127/76, pulse 100, temperature 98.3 F (36.8 C), temperature source Oral, resp. rate 18, height 5\' 2"  (1.575 m), weight 70.534 kg (155 lb 8 oz), SpO2 93 %. No results found. No results found for this or any previous visit (from the past 72 hour(s)).   HEENT: normal Cardio: IRR in mid 80's and no murmur today Resp: CTA B/L and unlabored GI: BS positive and NT, ND Extremity:  Edema LLE Skin:   Bruise Left periorbital and left hip/flank. No bruising on chest. Neuro: Alert/Oriented, Anxious and Abnormal Motor 5/5 in BUE, 4/5 RLE, 2- Left HF, KE, 3- ankle Musc/Skel:  Normal and Swelling Left hip and leg, mild, non tender. Left chest wall still tender but improved  -mild tenderness along left lower rib border still present Gen NAD   Assessment/Plan: 1. Functional deficits secondary to Left superior pubic ramus fx s/p fall 09/14/14 which require 3+ hours per day of interdisciplinary therapy in a comprehensive inpatient rehab setting. Physiatrist is providing close team supervision and 24 hour management of active medical problems listed below. Physiatrist and rehab team continue to assess barriers to discharge/monitor patient progress  toward functional and medical goals. FIM: FIM - Bathing Bathing Steps Patient Completed: Chest, Right Arm, Left Arm, Abdomen, Front perineal area, Buttocks, Right upper leg, Left upper leg, Right lower leg (including foot), Left lower leg (including foot) Bathing: 6: More than reasonable amount of time  FIM - Upper Body Dressing/Undressing Upper body dressing/undressing steps patient completed: Thread/unthread right bra strap, Thread/unthread left bra strap, Hook/unhook bra, Thread/unthread right sleeve of pullover shirt/dresss, Thread/unthread left sleeve of pullover shirt/dress, Put head through opening of pull over shirt/dress, Pull shirt over trunk Upper body dressing/undressing: 5: Set-up assist to: Obtain clothing/put away FIM - Lower Body Dressing/Undressing Lower body dressing/undressing steps patient completed: Thread/unthread right underwear leg, Thread/unthread left underwear leg, Pull underwear up/down, Thread/unthread left pants leg, Thread/unthread right pants leg, Pull pants up/down, Fasten/unfasten pants, Don/Doff right sock, Don/Doff left sock, Don/Doff right shoe, Don/Doff left shoe, Fasten/unfasten right shoe, Fasten/unfasten left shoe Lower body dressing/undressing: 6: Assistive device (Comment) (elastic laces)  FIM - Toileting Toileting steps completed by patient: Adjust clothing prior to toileting, Performs perineal hygiene, Adjust clothing after toileting Toileting Assistive Devices: Grab bar or rail for support Toileting: 6: More than reasonable amount of time  FIM - Radio producer Devices: Environmental consultant, Elevated toilet seat Toilet Transfers: 5-To toilet/BSC: Supervision (verbal cues/safety issues), 5-From toilet/BSC: Supervision (verbal cues/safety issues)  FIM - Control and instrumentation engineer Devices: Copy: 5: Chair or W/C > Bed: Supervision (verbal cues/safety issues), 5: Bed > Chair or W/C: Supervision  (verbal cues/safety issues) (Simultaneous filing. User may not have seen  previous data.)  FIM - Locomotion: Wheelchair Distance: 150 Locomotion: Wheelchair: 5: Travels 150 ft or more: maneuvers on rugs and over door sills with supervision, cueing or coaxing (Simultaneous filing. User may not have seen previous data.) FIM - Locomotion: Ambulation Locomotion: Ambulation Assistive Devices: Walker - Rolling (Simultaneous filing. User may not have seen previous data.) Ambulation/Gait Assistance: 5: Supervision (Simultaneous filing. User may not have seen previous data.) Locomotion: Ambulation: 1: Travels less than 50 ft with minimal assistance (Pt.>75%) (Simultaneous filing. User may not have seen previous data.)  Comprehension Comprehension Mode: Auditory Comprehension: 7-Follows complex conversation/direction: With no assist  Expression Expression Mode: Verbal Expression: 7-Expresses complex ideas: With no assist  Social Interaction Social Interaction: 7-Interacts appropriately with others - No medications needed.  Problem Solving Problem Solving: 7-Solves complex problems: Recognizes & self-corrects  Memory Memory: 7-Complete Independence: No helper  Medical Problem List and Plan: 1. Functional deficits secondary to Left superior and inferior pubic rami fracture with extraperitoneal hematoma and severe left groin pain with movement.   2.  DVT Prophylaxis/Anticoagulation: Pharmaceutical: Pradexa 3. Pain Management: Vicodin effective in pain management.  chest contusion, no rib fx on Xray, lidoderm helpful  -ice to chest/hip as needed  -  voltaren gel to knees (has GERD) was very helpful---can go home with this  -muscle rub to left ribs prn 4. Mood: LCSW to follow for evaluation and support.   5. Neuropsych: This patient is capable of making decisions on her own behalf. 6. Skin/Wound Care: Routine pressure relief measures. Monitor hematoma left eyebrow.   7.  Fluids/Electrolytes/Nutrition: Monitor I/O. Offer supplements prn poor intake. Check lytes    8.  Extraperitoneal hematoma: Monitor H/H with serial labs    9.  PAF: Will monitor HR tid and watch for recurrent tachycardia. Continue Tikosyn and metoprolol current dose. Multiple interactions with tikosyn-- limit SSRI, anti emetic use   -outpt cards follow up 10. COPD?: Reports h/o bronchitis and allergies--denies any respiratory issues.    11. HTN: Monitor with tid checks. Continue Metoprolol.  12. Hyperkalemia:  resolved 13. Leucocytosis: ua neg, cx with multispecies, afebrile 14. Chronic anorexia: encouraging po   LOS (Days) 12 A FACE TO FACE EVALUATION WAS PERFORMED  [name] T 09/28/2014, 7:33 AM

## 2014-09-28 NOTE — Progress Notes (Signed)
Occupational Therapy Discharge Summary  Patient Details  Name: [name] MRN: [id number] Date of Birth: [birth year]  Patient has met 7 of 8 long term goals due to improved activity tolerance, improved balance, ability to compensate for deficits and improved awareness.  Patient to discharge at overall Modified Independent level, supervision with bathroom transfers and hot meal prep.  Patient's care partner is independent to provide the necessary assistance at discharge.    Reasons goals not met: Pt currently Mod I with UB dressing due to requiring RW to obtain clothing and increased time with dressing (goal set for Independent).  However adequate for d/c.  Recommendation:  Patient will not require follow up OT services at this time.  Equipment: 3 in 1 and tub transfer bench  Reasons for discharge: treatment goals met and discharge from hospital  Patient/family agrees with progress made and goals achieved: Yes  OT Discharge Precautions/Restrictions  Precautions Precautions: Fall Restrictions Weight Bearing Restrictions: No LLE Weight Bearing: Weight bearing as tolerated General   Vital Signs Therapy Vitals Pulse Rate: 100 BP: 133/88 mmHg Patient Position (if appropriate): Lying Oxygen Therapy SpO2: 97 % O2 Device: Not Delivered Pain Pain Assessment Pain Assessment: 0-10 Pain Score: 2  Pain Type: Acute pain Pain Location: Pelvis Pain Orientation: Left Pain Descriptors / Indicators: Aching Pain Frequency: Intermittent Pain Onset: On-going Patients Stated Pain Goal: 3 Pain Intervention(s): Rest;Emotional support Multiple Pain Sites: No ADL  See FIM Vision/Perception  Vision- History Baseline Vision/History: Wears glasses Wears Glasses: Reading only Patient Visual Report: No change from baseline  Cognition Overall Cognitive Status: Within Functional Limits for tasks assessed Arousal/Alertness: Awake/alert Orientation Level: Oriented X4 Attention:  Focused Focused Attention: Appears intact Memory: Appears intact Awareness: Appears intact Problem Solving: Appears intact Safety/Judgment: Appears intact Sensation Sensation Light Touch: Appears Intact Stereognosis: Not tested Hot/Cold: Not tested Proprioception: Appears Intact Coordination Gross Motor Movements are Fluid and Coordinated: Yes Fine Motor Movements are Fluid and Coordinated: Not tested Coordination and Movement Description: limited by pain, only Finger Nose Finger Test: wfl bilaterally Motor  Motor Motor: Within Functional Limits Mobility  Bed Mobility Bed Mobility: Supine to Sit Supine to Sit: 6: Modified independent (Device/Increase time) Sit to Supine: 6: Modified independent (Device/Increase time) Transfers Sit to Stand: 6: Modified independent (Device/Increase time) Stand to Sit: 6: Modified independent (Device/Increase time)  Trunk/Postural Assessment  Cervical Assessment Cervical Assessment: Within Functional Limits Thoracic Assessment Thoracic Assessment: Within Functional Limits Lumbar Assessment Lumbar Assessment: Within Functional Limits Postural Control Postural Control: Within Functional Limits  Balance Balance Balance Assessed: Yes Static Sitting Balance Static Sitting - Balance Support: No upper extremity supported;Feet supported Static Sitting - Level of Assistance: 6: Modified independent (Device/Increase time) Dynamic Sitting Balance Dynamic Sitting - Balance Support: Left upper extremity supported;Right upper extremity supported;During functional activity;Feet supported Dynamic Sitting - Level of Assistance: 6: Modified independent (Device/Increase time) Static Standing Balance Static Standing - Balance Support: Bilateral upper extremity supported;During functional activity Static Standing - Level of Assistance: 6: Modified independent (Device/Increase time) Dynamic Standing Balance Dynamic Standing - Balance Support: Bilateral  upper extremity supported;During functional activity Dynamic Standing - Level of Assistance: 6: Modified independent (Device/Increase time) Extremity/Trunk Assessment RUE Assessment RUE Assessment: Within Functional Limits LUE Assessment LUE Assessment: Within Functional Limits  See FIM for current functional status  Laquenta Whitsell, [facility] 09/28/2014, 12:08 PM

## 2014-09-28 NOTE — Progress Notes (Signed)
Physical Therapy Session Note  Patient Details  Name: Shavontae Gibeault MRN: [id number] Date of Birth: August 01, 1925  Today's Date: 09/28/2014 PT Individual Time: 9292-4462 PT Individual Time Calculation (min): 27 min   Short Term Goals: Week 1:  PT Short Term Goal 1 (Week 1): Pt will sit>stand from standard furniture w/ MinGuard A PT Short Term Goal 1 - Progress (Week 1): Met PT Short Term Goal 2 (Week 1): Pt will propel w/c 150' w/ BUE and mod (I) PT Short Term Goal 2 - Progress (Week 1): Met PT Short Term Goal 3 (Week 1): Pt will ambulate 19' w/ MinGuard A PT Short Term Goal 3 - Progress (Week 1): Met Week 2:  PT Short Term Goal 1 (Week 2): STGs = LTGs due to ELOS  Skilled Therapeutic Interventions/Progress Updates:   Pt received in recliner; pt denies pain at rest.  Reviewed w/c parts management with pt demonstrating to pt first and then having pt return demonstrate all parts management mod I.  Also performed w/c mobility training in home environment with focus on problem solving negotiation around household obstacles and set up in narrow spaces mod I.  Back in room pt verbalizing concern about getting to the bathroom at night in time with increased urgency and increased time required to transfer OOB.  Pt states if she can't make it in time she will just wear a larger pad in her undergarments and then change it the next morning.  Educated pt on importance of keeping skin clean and dry for skin protection and prevent infection.  Also educated pt on option of placing BSC at foot of bed and use of BSC in case of urgency to prevent skin issues and prevent falls.  Pt is open to suggestion but would like to try ambulating to the bathroom with her RW first.  Pt left in recliner with all items within reach.   Therapy Documentation Precautions:  Precautions Precautions: Fall Precaution Comments: Fall Restrictions Weight Bearing Restrictions: No LLE Weight Bearing: Weight bearing as tolerated Vital  Signs: Therapy Vitals Temp: 97.5 F (36.4 C) Temp Source: Oral Pulse Rate: 71 Resp: 18 BP: 112/73 mmHg Patient Position (if appropriate): Sitting Oxygen Therapy SpO2: 97 % O2 Device: Not Delivered Pain: Pain Assessment Pain Score: 3    See FIM for current functional status  Therapy/Group: Individual Therapy  Raylene Everts [facility] 09/28/2014, 5:07 PM

## 2014-09-28 NOTE — Progress Notes (Signed)
Physical Therapy Discharge Summary  Patient Details  Name: [name] MRN: [id number] Date of Birth: 1925/06/27  Today's Date: 09/28/2014 PT Individual Time: Treatment Session 1: [phone] Treatment Session 2:  PT Individual Time Calculation (min): Treatment Session 1: 60 min Treatment Session 2: 30 min    Patient has met 10 of 10 long term goals due to improved activity tolerance, improved balance, decreased pain, ability to compensate for deficits and improved coordination.  Patient to discharge at an ambulatory level Modified Independent.   Patient's care partner requires assistance to provide the necessary physical assistance at discharge.  Recommendation:  Patient will benefit from ongoing skilled PT services in home health setting to continue to advance safe functional mobility, address ongoing impairments in falls reduction, progression towards ambulation with LRAD, and minimize fall risk.  Equipment: manual wheelchair & 3-in-1 bedside commode, sock aide, & reacher  Reasons for discharge: treatment goals met and discharge from hospital  Patient/family agrees with progress made and goals achieved: Yes  Therapeutic Interventions: Treatment Session 1: Therapeutic Activity: Pt received in bed, reports "heart went out of rhythm last night at 3am", and in general does not feel great. PT measured vitals and notes pt's HR at rest is elevated, but pt agrees to participate in PT session. Pt demonstrates supine to sit transfer from flat bed (to R side) without rails mod I with increased time for pain modulation. Pt demonstrates toilet transfer from bed with RW mod I - pt self manages pants & hygiene. Pt dresses upper body & lower body mod I edge of bed/in stand with increased time and use of adaptive equipment: reacher & sock aide.  Pt completes car transfer with RW in simulated car at low sedan height - req set-up assist to manage walker in/out of car.   Gait Training: Pt demonstrates mod I  ambulation with RW x 185' - demonstrating wide BOS, heavy reliance on arms and antalgic gait from L pelvic fracture.  Pt req set-up assist to complete ascending/descending 1 6" step with RW backwards (PT stabilizes RW for safety). Pt req verbal cues for least painful method to ascend/descend 12 steps with B rails req SBA.   W/C Management: Pt demonstrates mod I w/c propulsion on level surface in controlled & home environment x 200' with B UEs. Pt req increased time to remove/place legrests, but is able to do so mod I.   Treatment Session 2: Community Integration: Pt demonstrates w/c propulsion in community setting including: on/off elevator, propelling on various surfaces including tile, carpet, brick, and cement, and up/down long ramp - req Set up assist to hold elevator door to get on/off elevator, but otherwise mod I.  Pt has made great progress during IPR stay and is safe to d/c home with HHPT for follow up.     PT Discharge Precautions/Restrictions Precautions Precautions: Fall Restrictions Weight Bearing Restrictions: No LLE Weight Bearing: Weight bearing as tolerated Vital Signs Therapy Vitals Temp: 98.3 F (36.8 C) Temp Source: Oral Pulse Rate: 100 Resp: 18 BP: 133/88 mmHg Patient Position (if appropriate): Lying Oxygen Therapy SpO2: 97 % O2 Device: Not Delivered Pain Pain Assessment Pain Assessment: 0-10 Pain Score: 2  Pain Type: Acute pain Pain Location: Pelvis Pain Orientation: Left Pain Descriptors / Indicators: Aching Pain Frequency: Intermittent Pain Onset: On-going Patients Stated Pain Goal: 3 Pain Intervention(s): Rest;Emotional support Multiple Pain Sites: No Treatment Session 2: Pt c/o 5/10 pain in L pelvis during transfers; PT utilizes rest to decrease pt's pain.  Cognition Overall  Cognitive Status: Within Functional Limits for tasks assessed Arousal/Alertness: Awake/alert Orientation Level: Oriented X4 Attention: Focused Focused Attention:  Appears intact Memory: Appears intact Awareness: Appears intact Problem Solving: Appears intact Safety/Judgment: Appears intact Sensation Sensation Light Touch: Appears Intact Stereognosis: Not tested Hot/Cold: Not tested Proprioception: Appears Intact Coordination Gross Motor Movements are Fluid and Coordinated: Yes Fine Motor Movements are Fluid and Coordinated: Not tested Coordination and Movement Description: limited by pain, only Finger Nose Finger Test: wfl bilaterally Motor  Motor Motor: Within Functional Limits  Mobility Bed Mobility Bed Mobility: Supine to Sit Supine to Sit: 6: Modified independent (Device/Increase time) Sit to Supine: 6: Modified independent (Device/Increase time) Transfers Transfers: Yes Sit to Stand: 6: Modified independent (Device/Increase time) Stand to Sit: 6: Modified independent (Device/Increase time) Stand Pivot Transfers: 6: Modified independent (Device/Increase time) Locomotion  Ambulation Ambulation: Yes Ambulation/Gait Assistance: 6: Modified independent (Device/Increase time) Ambulation Distance (Feet): 185 Feet Assistive device: Rolling walker Ambulation/Gait Assistance Details: wide BOS; step-through pattern Gait Gait: Yes Gait Pattern: Impaired Gait Pattern: Step-through pattern;Wide base of support Gait velocity: decreased Stairs / Additional Locomotion Stairs: Yes Stairs Assistance: 5: Supervision Stairs Assistance Details: Verbal cues for safe use of DME/AE Stair Management Technique: Two rails Number of Stairs: 12 Height of Stairs: 3 Wheelchair Mobility Wheelchair Mobility: Yes Wheelchair Assistance: 6: Modified independent (Device/Increase time) Environmental health practitioner: Both upper extremities Wheelchair Parts Management: Independent  Trunk/Postural Assessment  Cervical Assessment Cervical Assessment: Within Functional Limits Thoracic Assessment Thoracic Assessment: Within Functional Limits Lumbar  Assessment Lumbar Assessment: Within Functional Limits Postural Control Postural Control: Within Functional Limits  Balance Balance Balance Assessed: Yes Static Sitting Balance Static Sitting - Balance Support: No upper extremity supported;Feet supported Static Sitting - Level of Assistance: 6: Modified independent (Device/Increase time) Dynamic Sitting Balance Dynamic Sitting - Balance Support: Left upper extremity supported;Right upper extremity supported;During functional activity;Feet supported Dynamic Sitting - Level of Assistance: 6: Modified independent (Device/Increase time) Static Standing Balance Static Standing - Balance Support: Bilateral upper extremity supported;During functional activity Static Standing - Level of Assistance: 6: Modified independent (Device/Increase time) Dynamic Standing Balance Dynamic Standing - Balance Support: Bilateral upper extremity supported;During functional activity Dynamic Standing - Level of Assistance: 6: Modified independent (Device/Increase time) Extremity Assessment  RUE Assessment RUE Assessment: Within Functional Limits LUE Assessment LUE Assessment: Within Functional Limits RLE Assessment RLE Assessment: Within Functional Limits LLE Assessment LLE Assessment: Exceptions to WFL LLE AROM (degrees) Overall AROM Left Lower Extremity: Deficits;Due to pain LLE Overall AROM Comments: hip flexion & abduction limited due to pain; knee & ankle wfl LLE PROM (degrees) Overall PROM Left Lower Extremity: Deficits;Due to pain LLE Overall PROM Comments: hip flexion & abduction limited due to pain; knee & ankle wfl LLE Strength LLE Overall Strength: Deficits;Due to pain LLE Overall Strength Comments: hip flexion 2+/5, hip abduction 2/5, knee & ankle 5/5  See FIM for current functional status  Ayuub Penley M 09/28/2014, 9:09 AM

## 2014-09-28 NOTE — Progress Notes (Signed)
Occupational Therapy Session Note  Patient Details  Name: [name] MRN: [id number] Date of Birth: 1926/05/08  Today's Date: 09/28/2014 OT Individual Time: 1115-1200 OT Individual Time Calculation (min): 45 min    Short Term Goals: Week 2:  OT Short Term Goal 1 (Week 2): STG=LTG d/t anticipated brief remaining LOS  Skilled Therapeutic Interventions/Progress Updates:    Engaged in therapeutic activity with focus on functional transfers, dynamic standing balance, and overall activity tolerance.  Pt reports having her heart "go out" on her this AM and being in Afib, reporting she changed clothes this morning and not wanting to complete bathing and dressing.  Per pt report she completed dressing without assistance.  Ambulated to bathroom with RW and completed toilet transfer with [facility] over toilet pt completing transfer and toileting tasks without assist.  Supervision with mobility with RW.  Completed tub/shower transfer with use of tub bench with pt able to complete with supervision and increased time.  In therapy gym engaged in standing activity incorporating reaching and sit > stand for endurance.  Pt returned to room and left with lunch tray and all needs in reach.  Therapy Documentation Precautions:  Precautions Precautions: Fall Precaution Comments: Fall Restrictions Weight Bearing Restrictions: No LLE Weight Bearing: Weight bearing as tolerated General:   Vital Signs: Therapy Vitals Pulse Rate: 100 BP: 133/88 mmHg Patient Position (if appropriate): Lying Oxygen Therapy SpO2: 97 % O2 Device: Not Delivered Pain: Pain Assessment Pain Assessment: 0-10 Pain Score: 2  Pain Type: Acute pain Pain Location: Pelvis Pain Orientation: Left Pain Descriptors / Indicators: Aching Pain Frequency: Intermittent Pain Onset: On-going Patients Stated Pain Goal: 3 Pain Intervention(s): Rest;Emotional support Multiple Pain Sites: No  See FIM for current functional status  Therapy/Group:  Individual Therapy  Simonne Come 09/28/2014, 12:13 PM

## 2014-09-29 MED ORDER — HYDROCODONE-ACETAMINOPHEN 5-325 MG PO TABS: 1.0000 | ORAL_TABLET | Freq: Four times a day (QID) | ORAL | Status: DC | PRN

## 2014-09-29 MED ORDER — SENNOSIDES-DOCUSATE SODIUM 8.6-50 MG PO TABS: 2.0000 | ORAL_TABLET | Freq: Every day | ORAL | Status: DC

## 2014-09-29 MED ORDER — DICLOFENAC SODIUM 1 % TD GEL: 2.0000 g | Freq: Three times a day (TID) | TRANSDERMAL | Status: DC

## 2014-09-29 MED ORDER — MUSCLE RUB 10-15 % EX CREA: 1.0000 "application " | TOPICAL_CREAM | CUTANEOUS | Status: DC | PRN

## 2014-09-29 MED ORDER — ACETAMINOPHEN 325 MG PO TABS: 650.0000 mg | ORAL_TABLET | Freq: Four times a day (QID) | ORAL | Status: DC | PRN

## 2014-09-29 NOTE — Progress Notes (Signed)
81 y.o. female with history of A Fib--on Pradaxa, h/o melanoma R-eye, PPM who lost her balance and fell onto her left hip and struck her left eye on 09/14/14.  Patient reported that last 3 weeks have been busy and hectic due to multiple appts to get her husband ready for AVR in the future.  Patient with subsequent complains of left groin pain, chest pain and back pain.  She was noted to have left forehead contusion and X rays of left hip revealing mildly displaced left inferior and left superior pubic rami fracture with extraperitoneal pelvic hematoma and stable 3 cm infrarenal AAA. She was evaluated by Dr. Lyla Glassing who recommended WBAT with walker  Subjective/Complaints: Excited to go home. No complaints.  ROS-  Objective: Vital Signs: Blood pressure 141/86, pulse 72, temperature 98.4 F (36.9 C), temperature source Oral, resp. rate 16, height _0  (1.575 m), weight 70.852 kg (156 lb 3.2 oz), SpO2 95 %. No results found. No results found for this or any previous visit (from the past 72 hour(s)).   HEENT: normal Cardio: IRR in mid 60's Resp: CTA B/L and unlabored GI: BS positive and NT, ND Extremity:  Edema LLE Skin:   Bruise Left periorbital and left hip/flank. No bruising on chest. Neuro: Alert/Oriented, Anxious and Abnormal Motor 5/5 in BUE, 4/5 RLE, 2- Left HF, KE, 3- ankle Musc/Skel:  Normal and Swelling Left hip and leg, mild, non tender. Left chest wall still tender but improved  -mild tenderness along left lower rib border still present Gen NAD   Assessment/Plan: 1. Functional deficits secondary to Left superior pubic ramus fx s/p fall 09/14/14 which require 3+ hours per day of interdisciplinary therapy in a comprehensive inpatient rehab setting. Physiatrist is providing close team supervision and 24 hour management of active medical problems listed below. Physiatrist and rehab team continue to assess barriers to discharge/monitor patient progress toward functional and medical  goals.  Home today. Goals met. Follow up arranged.  FIM: FIM - Bathing Bathing Steps Patient Completed: Chest, Right Arm, Left Arm, Abdomen, Front perineal area, Buttocks, Right upper leg, Left upper leg, Right lower leg (including foot), Left lower leg (including foot) Bathing: 6: More than reasonable amount of time  FIM - Upper Body Dressing/Undressing Upper body dressing/undressing steps patient completed: Thread/unthread right bra strap, Thread/unthread left bra strap, Hook/unhook bra, Thread/unthread right sleeve of pullover shirt/dresss, Thread/unthread left sleeve of pullover shirt/dress, Put head through opening of pull over shirt/dress, Pull shirt over trunk Upper body dressing/undressing: 6: More than reasonable amount of time FIM - Lower Body Dressing/Undressing Lower body dressing/undressing steps patient completed: Thread/unthread right underwear leg, Thread/unthread left underwear leg, Pull underwear up/down, Thread/unthread right pants leg, Thread/unthread left pants leg, Pull pants up/down, Fasten/unfasten pants, Don/Doff right sock, Don/Doff left sock, Don/Doff right shoe, Don/Doff left shoe Lower body dressing/undressing: 6: More than reasonable amount of time  FIM - Toileting Toileting steps completed by patient: Adjust clothing prior to toileting, Performs perineal hygiene, Adjust clothing after toileting Toileting Assistive Devices: Grab bar or rail for support Toileting: 6: Assistive device: No helper  FIM - Radio producer Devices: Engineer, civil (consulting), Insurance account manager Transfers: 6-To toilet/ BSC, 6-From toilet/BSC  FIM - Control and instrumentation engineer Devices: Environmental consultant, Arm rests Bed/Chair Transfer: 6: Supine > Sit: No assist, 6: Sit > Supine: No assist, 6: Bed > Chair or W/C: No assist, 6: Chair or W/C > Bed: No assist  FIM - Locomotion: Wheelchair Distance: 200 Locomotion: Wheelchair: 6:  Travels 150 ft or more, turns around,  maneuvers to table, bed or toilet, negotiates 3% grade: maneuvers on rugs and over door sills independently FIM - Locomotion: Ambulation Locomotion: Ambulation Assistive Devices: Administrator Ambulation/Gait Assistance: 6: Modified independent (Device/Increase time) Locomotion: Ambulation: 6: Travels 150 ft or more with assistive device/no helper  Comprehension Comprehension Mode: Auditory Comprehension: 7-Follows complex conversation/direction: With no assist  Expression Expression Mode: Verbal Expression: 7-Expresses complex ideas: With no assist  Social Interaction Social Interaction: 7-Interacts appropriately with others - No medications needed.  Problem Solving Problem Solving: 7-Solves complex problems: Recognizes & self-corrects  Memory Memory: 7-Complete Independence: No helper  Medical Problem List and Plan: 1. Functional deficits secondary to Left superior and inferior pubic rami fracture with extraperitoneal hematoma and severe left groin pain with movement.   2.  DVT Prophylaxis/Anticoagulation: Pharmaceutical: Pradexa 3. Pain Management: Vicodin effective in pain management.  chest contusion, no rib fx on Xray, lidoderm helpful  -ice to chest/hip as needed  -  voltaren gel to knees (has GERD) was very helpful---can go home with this  -muscle rub to left ribs prn 4. Mood: LCSW to follow for evaluation and support.   5. Neuropsych: This patient is capable of making decisions on her own behalf. 6. Skin/Wound Care: Routine pressure relief measures. Monitor hematoma left eyebrow.   7. Fluids/Electrolytes/Nutrition: Monitor I/O. Offer supplements prn poor intake. Check lytes    8.  Extraperitoneal hematoma: Monitor H/H with serial labs    9.  PAF: Will monitor HR tid and watch for recurrent tachycardia. Continue Tikosyn and metoprolol current dose. Multiple interactions with tikosyn-- limit SSRI, anti emetic use   -outpt cards follow up 10. COPD?: Reports h/o  bronchitis and allergies--denies any respiratory issues.    11. HTN: Monitor with tid checks. Continue Metoprolol.  12. Hyperkalemia:  resolved 13. Leucocytosis: ua neg, cx with multispecies, afebrile 14. Chronic anorexia: encouraging po   LOS (Days) 13 A FACE TO FACE EVALUATION WAS PERFORMED  SWARTZ,ZACHARY T 09/29/2014, 7:22 AM

## 2014-09-29 NOTE — Discharge Summary (Signed)
Physician Discharge Summary  Patient ID: [name] MRN: [id number] DOB/AGE: June 10, 1925 81 y.o.  Admit date: 09/16/2014 Discharge date: 09/29/2014  Discharge Diagnoses:  Principal Problem:   Fracture of left superior pubic ramus Active Problems:   PAF (paroxysmal atrial fibrillation)   Chest pain, musculoskeletal   Valvular heart disease   Abdominal aortic aneurysm   Discharged Condition: Stable    Labs:  Basic Metabolic Panel: BMP Latest Ref Rng 09/22/2014 09/17/2014 09/16/2014  Glucose 65 - 99 mg/dL 91 101(H) 94  BUN 6 - 20 mg/dL 14 11 13   Creatinine 0.44 - 1.00 mg/dL 0.72 0.79 0.85  Sodium 135 - 145 mmol/L 138 135 138  Potassium 3.5 - 5.1 mmol/L 4.2 4.2 5.7(H)  Chloride 101 - 111 mmol/L 106 105 107  CO2 22 - 32 mmol/L 24 21(L) 23  Calcium 8.9 - 10.3 mg/dL 8.9 8.5(L) 8.6(L)     CBC: CBC Latest Ref Rng 09/17/2014 09/14/2014 07/17/2014  WBC 4.0 - 10.5 K/uL 8.6 10.7(H) 8.9  Hemoglobin 12.0 - 15.0 g/dL 14.8 15.1(H) 15.5(H)  Hematocrit 36.0 - 46.0 % 43.3 43.8 45.6  Platelets 150 - 400 K/uL 140(L) 140(L) 173     CBG: No results for input(s): GLUCAP in the last 168 hours.   Brief HPI:   [name] is a 81 y.o. female with history of A Fib--on Pradaxa, h/o melanoma R-eye, PPM who lost her balance and fell onto her left hip and struck her left eye on 09/14/14.Marland Kitchen Patient with subsequent complains of left groin pain, chest pain and back pain. She was noted to have left forehead contusion as well as mildly displaced left inferior and left superior pubic rami fracture with extraperitoneal pelvic hematoma. Dr. Lyla Glassing recommended WBAT with walker and she was admitted for pain management and therapy. She has had issues with recurrent AFib last night but converted to NSR and Dr. Stanford Breed recommended continuing Tikosyn and Toprol at current dose.  PT/OT evaluations done yesterday and CIR recommended by MD and Rehab team   Hospital Course: Shericka Johnstone was admitted to rehab 09/16/2014 for  inpatient therapies to consist of PT and OT at least three hours five days a week. Past admission physiatrist, therapy team and rehab RN have worked together to provide customized collaborative inpatient rehab.  She was noted to be hyperkalemic at admission therefore K dur was discontinued. Check of lytes show that renal status and potassium levesl are stable.  She had recurrent A fib with activity on 05/10 but converted to NSR. Cardiology recommended continuing BB and Tikosyn at current dose and patient to follow up in office for input on further medication changes. . Blood pressures and heart rate were monitored on tid basis and has been reasonably controlled. Reactive leucocytosis has resolved and urine culture was negative for infection.   She was started on bowel program to help with narcotic induced constipation and GI symptoms have improved with increase in Carafate. Po intake has been good and Carafate was decreased to home dose at discharge.  Pain due to chest contusion was managed with use of lidocaine patch as well as muscle rub.  Lidocaine patch was d/c prior to discharge and patient reports resolution of rib pain. Pelvic pain control has been reasonable with use of hydrocodone on prn basis. She had complaints of bilateral knee pain with increase in activity level andVoltaren gel has been effective in pain management. She has made steady progress during her rehab stay and is modified independent at discharge. She will continue to  receive follow up HHPT and [place] by [place] past discharge.     Rehab course: During patient's stay in rehab weekly team conferences were held to monitor patient's progress, set goals and discuss barriers to discharge. At admission, patient required moderate assistance with ADL tasks and mobility. She has had improvement in activity tolerance, balance, postural control, as well as ability to compensate for deficits. She is modified independent for transfers and  ambulation. She is able to ambulate 150' with use of RW. She is able to complete ADL tasks independently.  Family is able to provide assistance as needed past discharge.     Disposition: 01-Home or Self Care  Diet: Heart healthy.   Special Instructions: 1. Do not use lasix and K dur.  2. Monitor weight daily.      Medication List    STOP taking these medications        furosemide 40 MG tablet  Commonly known as:  LASIX     potassium chloride SA 20 MEQ tablet  Commonly known as:  K-DUR,KLOR-CON      TAKE these medications        acetaminophen 325 MG tablet  Commonly known as:  TYLENOL  Take 2 tablets (650 mg total) by mouth every 6 (six) hours as needed for mild pain or moderate pain.     CALCIUM + D PO  Take 1 tablet by mouth 2 (two) times daily.     dabigatran 75 MG Caps capsule  Commonly known as:  PRADAXA  Take 1 capsule (75 mg total) by mouth every 12 (twelve) hours.     diclofenac sodium 1 % Gel  Commonly known as:  VOLTAREN  Apply 2 g topically 3 (three) times daily.     dofetilide 250 MCG capsule  Commonly known as:  TIKOSYN  take 1 capsule by mouth twice a day (SCHEDULE IS 8AM AND 8PM,CANNOT VARY MORE THAN 1 HOUR)     Fish Oil 1000 MG Caps  Take 1,000 mg by mouth 2 (two) times daily.     fluorometholone 0.1 % ophthalmic suspension  Commonly known as:  FML  Place 1 drop into both eyes daily.     HYDROcodone-acetaminophen 5-325 MG per tablet--Rx # 45 pills   Commonly known as:  NORCO/VICODIN  Take 1 tablet by mouth every 6 (six) hours as needed.     magnesium oxide 400 MG tablet  Commonly known as:  MAG-OX  Take 1 tablet (400 mg total) by mouth daily.     metoprolol succinate 50 MG 24 hr tablet  Commonly known as:  TOPROL-XL  Take 1 tablet (50 mg total) by mouth 2 (two) times daily.     multivitamins ther. w/minerals Tabs tablet  Take 1 tablet by mouth every morning.     MUSCLE RUB 10-15 % Crea  Apply 1 application topically as needed for  muscle pain.     ondansetron 4 MG disintegrating tablet  Commonly known as:  ZOFRAN ODT  Take 1 tablet (4 mg total) by mouth every 8 (eight) hours as needed for nausea.     ranitidine 150 MG capsule  Commonly known as:  ZANTAC  Take 150 mg by mouth every morning.     rosuvastatin 5 MG tablet  Commonly known as:  CRESTOR  Take 1 tablet (5 mg total) by mouth every evening.     senna-docusate 8.6-50 MG per tablet  Commonly known as:  Senokot-S  Take 2 tablets by mouth at bedtime.  For constipation     sucralfate 1 G tablet  Commonly known as:  CARAFATE  Take 1 tablet (1 g total) by mouth 4 (four) times daily.           Follow-up Information    Follow up with Meredith Staggers, MD.   Specialty:  Physical Medicine and Rehabilitation   Why:  As needed   Contact information:   [address], [place] [address] [phone]       Follow up with Swinteck, Horald Pollen, MD.   Specialty:  Orthopedic Surgery   Why:  Call for appointment   Contact information:   [address] [place] [address] [phone]       Follow up with Sherren Mocha, MD.   Specialty:  Cardiology   Why:  Call for appointment   Contact information:   [address] [address] [phone]       Follow up with Merrilee Seashore, MD On 10/06/2014.   Specialty:  Internal Medicine   Why:  @ 11:30 am   Contact information:   [address] [place]  [address] [phone]       Signed: Bary Leriche 09/29/2014, 2:03 PM

## 2014-09-29 NOTE — Progress Notes (Signed)
09/29/14 1320 NSG Patient discharged to home per wheelchair accompanied by RN and family members. Discharge instructions given by Pam PA no questions noted.

## 2014-09-30 ENCOUNTER — Encounter: Payer: Self-pay | Admitting: Cardiovascular Disease

## 2014-09-30 ENCOUNTER — Ambulatory Visit (INDEPENDENT_AMBULATORY_CARE_PROVIDER_SITE_OTHER): Payer: Medicare Other | Admitting: *Deleted

## 2014-09-30 DIAGNOSIS — I4819 Other persistent atrial fibrillation: Secondary | ICD-10-CM

## 2014-09-30 DIAGNOSIS — R102 Pelvic and perineal pain: Secondary | ICD-10-CM | POA: Diagnosis not present

## 2014-09-30 DIAGNOSIS — I481 Persistent atrial fibrillation: Secondary | ICD-10-CM | POA: Diagnosis not present

## 2014-09-30 DIAGNOSIS — S32512D Fracture of superior rim of left pubis, subsequent encounter for fracture with routine healing: Secondary | ICD-10-CM | POA: Diagnosis not present

## 2014-09-30 DIAGNOSIS — S30202D Contusion of unspecified external genital organ, female, subsequent encounter: Secondary | ICD-10-CM | POA: Diagnosis not present

## 2014-09-30 DIAGNOSIS — I4891 Unspecified atrial fibrillation: Secondary | ICD-10-CM | POA: Diagnosis not present

## 2014-09-30 NOTE — Progress Notes (Signed)
Remote pacemaker transmission.   

## 2014-10-03 DIAGNOSIS — S32512D Fracture of superior rim of left pubis, subsequent encounter for fracture with routine healing: Secondary | ICD-10-CM | POA: Diagnosis not present

## 2014-10-03 DIAGNOSIS — S30202D Contusion of unspecified external genital organ, female, subsequent encounter: Secondary | ICD-10-CM | POA: Diagnosis not present

## 2014-10-03 DIAGNOSIS — R102 Pelvic and perineal pain: Secondary | ICD-10-CM | POA: Diagnosis not present

## 2014-10-03 DIAGNOSIS — I4891 Unspecified atrial fibrillation: Secondary | ICD-10-CM | POA: Diagnosis not present

## 2014-10-05 DIAGNOSIS — S32512D Fracture of superior rim of left pubis, subsequent encounter for fracture with routine healing: Secondary | ICD-10-CM | POA: Diagnosis not present

## 2014-10-05 DIAGNOSIS — I4891 Unspecified atrial fibrillation: Secondary | ICD-10-CM | POA: Diagnosis not present

## 2014-10-05 DIAGNOSIS — R102 Pelvic and perineal pain: Secondary | ICD-10-CM | POA: Diagnosis not present

## 2014-10-05 DIAGNOSIS — S30202D Contusion of unspecified external genital organ, female, subsequent encounter: Secondary | ICD-10-CM | POA: Diagnosis not present

## 2014-10-06 DIAGNOSIS — S32512A Fracture of superior rim of left pubis, initial encounter for closed fracture: Secondary | ICD-10-CM | POA: Diagnosis not present

## 2014-10-06 DIAGNOSIS — R102 Pelvic and perineal pain: Secondary | ICD-10-CM | POA: Diagnosis not present

## 2014-10-06 DIAGNOSIS — S329XXA Fracture of unspecified parts of lumbosacral spine and pelvis, initial encounter for closed fracture: Secondary | ICD-10-CM | POA: Diagnosis not present

## 2014-10-06 DIAGNOSIS — R600 Localized edema: Secondary | ICD-10-CM | POA: Diagnosis not present

## 2014-10-11 DIAGNOSIS — S30202D Contusion of unspecified external genital organ, female, subsequent encounter: Secondary | ICD-10-CM | POA: Diagnosis not present

## 2014-10-11 DIAGNOSIS — S32512D Fracture of superior rim of left pubis, subsequent encounter for fracture with routine healing: Secondary | ICD-10-CM | POA: Diagnosis not present

## 2014-10-11 DIAGNOSIS — I4891 Unspecified atrial fibrillation: Secondary | ICD-10-CM | POA: Diagnosis not present

## 2014-10-11 DIAGNOSIS — R102 Pelvic and perineal pain: Secondary | ICD-10-CM | POA: Diagnosis not present

## 2014-10-12 ENCOUNTER — Encounter: Payer: Self-pay | Admitting: Internal Medicine

## 2014-10-12 DIAGNOSIS — S32512D Fracture of superior rim of left pubis, subsequent encounter for fracture with routine healing: Secondary | ICD-10-CM | POA: Diagnosis not present

## 2014-10-12 DIAGNOSIS — R102 Pelvic and perineal pain: Secondary | ICD-10-CM | POA: Diagnosis not present

## 2014-10-12 DIAGNOSIS — S30202D Contusion of unspecified external genital organ, female, subsequent encounter: Secondary | ICD-10-CM | POA: Diagnosis not present

## 2014-10-12 DIAGNOSIS — I4891 Unspecified atrial fibrillation: Secondary | ICD-10-CM | POA: Diagnosis not present

## 2014-10-13 DIAGNOSIS — R102 Pelvic and perineal pain: Secondary | ICD-10-CM | POA: Diagnosis not present

## 2014-10-14 DIAGNOSIS — R102 Pelvic and perineal pain: Secondary | ICD-10-CM | POA: Diagnosis not present

## 2014-10-14 DIAGNOSIS — S30202D Contusion of unspecified external genital organ, female, subsequent encounter: Secondary | ICD-10-CM | POA: Diagnosis not present

## 2014-10-14 DIAGNOSIS — S32512D Fracture of superior rim of left pubis, subsequent encounter for fracture with routine healing: Secondary | ICD-10-CM | POA: Diagnosis not present

## 2014-10-14 DIAGNOSIS — I4891 Unspecified atrial fibrillation: Secondary | ICD-10-CM | POA: Diagnosis not present

## 2014-10-18 LAB — CUP PACEART REMOTE DEVICE CHECK
Brady Statistic RA Percent Paced: 36 %
Brady Statistic RV Percent Paced: 17 %
Date Time Interrogation Session: 20160607231333
Lead Channel Impedance Value: 612 Ohm
Lead Channel Impedance Value: 685 Ohm
Lead Channel Setting Pacing Amplitude: 2 V
Lead Channel Setting Pacing Pulse Width: 0.5 ms
MDC IDC MSMT LEADCHNL RV SENSING INTR AMPL: 6.9 mV
MDC IDC PG SERIAL: 144309
MDC IDC SET LEADCHNL RV PACING AMPLITUDE: 2.4 V
MDC IDC SET LEADCHNL RV SENSING SENSITIVITY: 2.5 mV
MDC IDC SET ZONE DETECTION INTERVAL: 375 ms

## 2014-10-19 DIAGNOSIS — S32512D Fracture of superior rim of left pubis, subsequent encounter for fracture with routine healing: Secondary | ICD-10-CM | POA: Diagnosis not present

## 2014-10-19 DIAGNOSIS — R102 Pelvic and perineal pain: Secondary | ICD-10-CM | POA: Diagnosis not present

## 2014-10-19 DIAGNOSIS — S30202D Contusion of unspecified external genital organ, female, subsequent encounter: Secondary | ICD-10-CM | POA: Diagnosis not present

## 2014-10-19 DIAGNOSIS — I4891 Unspecified atrial fibrillation: Secondary | ICD-10-CM | POA: Diagnosis not present

## 2014-10-20 ENCOUNTER — Other Ambulatory Visit: Payer: Self-pay | Admitting: Internal Medicine

## 2014-10-20 DIAGNOSIS — R1310 Dysphagia, unspecified: Secondary | ICD-10-CM

## 2014-10-21 DIAGNOSIS — S30202D Contusion of unspecified external genital organ, female, subsequent encounter: Secondary | ICD-10-CM | POA: Diagnosis not present

## 2014-10-21 DIAGNOSIS — S32512D Fracture of superior rim of left pubis, subsequent encounter for fracture with routine healing: Secondary | ICD-10-CM | POA: Diagnosis not present

## 2014-10-21 DIAGNOSIS — R102 Pelvic and perineal pain: Secondary | ICD-10-CM | POA: Diagnosis not present

## 2014-10-21 DIAGNOSIS — I4891 Unspecified atrial fibrillation: Secondary | ICD-10-CM | POA: Diagnosis not present

## 2014-10-23 ENCOUNTER — Other Ambulatory Visit: Payer: Self-pay | Admitting: Cardiovascular Disease

## 2014-10-24 ENCOUNTER — Other Ambulatory Visit: Payer: Self-pay | Admitting: Internal Medicine

## 2014-10-24 ENCOUNTER — Telehealth: Payer: Self-pay | Admitting: Cardiovascular Disease

## 2014-10-24 ENCOUNTER — Ambulatory Visit
Admission: RE | Admit: 2014-10-24 | Discharge: 2014-10-24 | Disposition: A | Discharge status: Home / Self Care | Payer: Medicare Other | Source: Ambulatory Visit | Attending: Internal Medicine | Admitting: Internal Medicine

## 2014-10-24 DIAGNOSIS — K219 Gastro-esophageal reflux disease without esophagitis: Secondary | ICD-10-CM | POA: Diagnosis not present

## 2014-10-24 DIAGNOSIS — K224 Dyskinesia of esophagus: Secondary | ICD-10-CM | POA: Diagnosis not present

## 2014-10-24 DIAGNOSIS — R1310 Dysphagia, unspecified: Secondary | ICD-10-CM

## 2014-10-24 NOTE — Telephone Encounter (Signed)
E-SENT PHARMACY

## 2014-10-24 NOTE — Telephone Encounter (Signed)
Spoke with pt, she woke with her heart out of rhythm this am. She has taken her tikosyn and metoprolol. Her bp is 111/62 and pulse 144. She denies chest pain but is SOB and "feels bad". She is going to send a transmission. Will discuss with dr croitoru.

## 2014-10-24 NOTE — Telephone Encounter (Signed)
Spoke with pt, discussed with dr c, patient aware not really anything to do at this point. She was told to lie down in a dark room and take it easy. She was instructed to call back by 3 pm if she has not converted, she will then need to go to the ER. Patient voiced understanding

## 2014-10-25 ENCOUNTER — Telehealth: Payer: Self-pay | Admitting: *Deleted

## 2014-10-25 DIAGNOSIS — S32512D Fracture of superior rim of left pubis, subsequent encounter for fracture with routine healing: Secondary | ICD-10-CM | POA: Diagnosis not present

## 2014-10-25 DIAGNOSIS — S30202D Contusion of unspecified external genital organ, female, subsequent encounter: Secondary | ICD-10-CM | POA: Diagnosis not present

## 2014-10-25 DIAGNOSIS — I4891 Unspecified atrial fibrillation: Secondary | ICD-10-CM | POA: Diagnosis not present

## 2014-10-25 DIAGNOSIS — R102 Pelvic and perineal pain: Secondary | ICD-10-CM | POA: Diagnosis not present

## 2014-10-25 NOTE — Telephone Encounter (Signed)
Called patient due to yesterday's AF episode. Patient states that she feels fine today. She stated that she returned to SR late last night and has not had any problems since. HR 71 bpm. I encouraged her to call if she has any further issues. Patient voiced understanding.

## 2014-10-26 ENCOUNTER — Encounter: Payer: Self-pay | Admitting: Cardiovascular Disease

## 2014-10-26 ENCOUNTER — Telehealth: Payer: Self-pay | Admitting: Cardiovascular Disease

## 2014-10-26 ENCOUNTER — Telehealth: Payer: Self-pay | Admitting: Gastroenterology

## 2014-10-26 DIAGNOSIS — S32512D Fracture of superior rim of left pubis, subsequent encounter for fracture with routine healing: Secondary | ICD-10-CM | POA: Diagnosis not present

## 2014-10-26 DIAGNOSIS — R102 Pelvic and perineal pain: Secondary | ICD-10-CM | POA: Diagnosis not present

## 2014-10-26 DIAGNOSIS — S30202D Contusion of unspecified external genital organ, female, subsequent encounter: Secondary | ICD-10-CM | POA: Diagnosis not present

## 2014-10-26 DIAGNOSIS — I4891 Unspecified atrial fibrillation: Secondary | ICD-10-CM | POA: Diagnosis not present

## 2014-10-26 NOTE — Telephone Encounter (Signed)
Left message for Pat. Called the patient's home and spoke with Theressa Millard. Appointment first available is 11/10/14 with Amy Esterwood at 2:30 pm.

## 2014-10-26 NOTE — Telephone Encounter (Signed)
Spoke to patient Blood pressure today 100/61, 98/63 She states she wanted office to know her heart rate this was taken @7  AM and 8am Not symptomatic- no shortness of breath or chest discomfort. She states she will send a download to device clinic She states she took morning medications- metoprolol and tikosyn  Informed patient, will defer to Dr Sallyanne Kuster and let device pool be aware

## 2014-10-26 NOTE — Telephone Encounter (Signed)
Transmission reviewed showing AF. Spoke w/pt and pt woke up very fatigued and SOB. Pt took meds this am and feels some better since heart rate has slowed down. Heart rate at last check was 115. Transmission put in red folder for review by Dr C. Routing phone note to Dr C. Pt is scheduled for 11-03-14 @ 1415. Pt aware.

## 2014-10-26 NOTE — Telephone Encounter (Signed)
Pt heart is still high,she took three readings this morning. It was 142,148 and the lat one 125.Please call to advise.

## 2014-10-27 ENCOUNTER — Telehealth: Payer: Self-pay | Admitting: Gastroenterology

## 2014-10-27 NOTE — Telephone Encounter (Signed)
Received records from [facility] forwarded to Dr. Deatra Ina 10/27/14 fbg.

## 2014-10-28 DIAGNOSIS — I4891 Unspecified atrial fibrillation: Secondary | ICD-10-CM | POA: Diagnosis not present

## 2014-10-28 DIAGNOSIS — R102 Pelvic and perineal pain: Secondary | ICD-10-CM | POA: Diagnosis not present

## 2014-10-28 DIAGNOSIS — S30202D Contusion of unspecified external genital organ, female, subsequent encounter: Secondary | ICD-10-CM | POA: Diagnosis not present

## 2014-10-28 DIAGNOSIS — S32512D Fracture of superior rim of left pubis, subsequent encounter for fracture with routine healing: Secondary | ICD-10-CM | POA: Diagnosis not present

## 2014-11-01 ENCOUNTER — Encounter: Payer: Self-pay | Admitting: Cardiology

## 2014-11-01 DIAGNOSIS — S32512D Fracture of superior rim of left pubis, subsequent encounter for fracture with routine healing: Secondary | ICD-10-CM | POA: Diagnosis not present

## 2014-11-01 DIAGNOSIS — R102 Pelvic and perineal pain: Secondary | ICD-10-CM | POA: Diagnosis not present

## 2014-11-01 DIAGNOSIS — S30202D Contusion of unspecified external genital organ, female, subsequent encounter: Secondary | ICD-10-CM | POA: Diagnosis not present

## 2014-11-01 DIAGNOSIS — I4891 Unspecified atrial fibrillation: Secondary | ICD-10-CM | POA: Diagnosis not present

## 2014-11-03 ENCOUNTER — Encounter: Payer: Self-pay | Admitting: Cardiovascular Disease

## 2014-11-03 ENCOUNTER — Ambulatory Visit (INDEPENDENT_AMBULATORY_CARE_PROVIDER_SITE_OTHER): Payer: Medicare Other | Admitting: Cardiovascular Disease

## 2014-11-03 VITALS — BP 124/70 | HR 72 | Ht 62.0 in | Wt 147.7 lb

## 2014-11-03 DIAGNOSIS — I701 Atherosclerosis of renal artery: Secondary | ICD-10-CM | POA: Diagnosis not present

## 2014-11-03 DIAGNOSIS — I481 Persistent atrial fibrillation: Secondary | ICD-10-CM | POA: Diagnosis not present

## 2014-11-03 DIAGNOSIS — I10 Essential (primary) hypertension: Secondary | ICD-10-CM

## 2014-11-03 DIAGNOSIS — I480 Paroxysmal atrial fibrillation: Secondary | ICD-10-CM | POA: Diagnosis not present

## 2014-11-03 DIAGNOSIS — I2510 Atherosclerotic heart disease of native coronary artery without angina pectoris: Secondary | ICD-10-CM

## 2014-11-03 DIAGNOSIS — I452 Bifascicular block: Secondary | ICD-10-CM | POA: Diagnosis not present

## 2014-11-03 DIAGNOSIS — I4819 Other persistent atrial fibrillation: Secondary | ICD-10-CM

## 2014-11-03 DIAGNOSIS — I714 Abdominal aortic aneurysm, without rupture, unspecified: Secondary | ICD-10-CM

## 2014-11-03 DIAGNOSIS — Z950 Presence of cardiac pacemaker: Secondary | ICD-10-CM
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
## 2014-11-03 NOTE — Patient Instructions (Signed)
A referral has been placed for a follow-up visit with Dr. Caryl Comes due to your increasing episodes of atrial fibrillation.  Remote monitoring is used to monitor your Pacemaker or ICD from home. This monitoring reduces the number of office visits required to check your device to one time per year. It allows [place] to monitor the functioning of your device to ensure it is working properly. You are scheduled for a device check from home on February 04, 2015. You may send your transmission at any time that day. If you have a wireless device, the transmission will be sent automatically. After your physician reviews your transmission, you will receive a postcard with your next transmission date.  Dr. Sallyanne Kuster recommends that you schedule a follow-up appointment in: 6 months

## 2014-11-04 DIAGNOSIS — E780 Pure hypercholesterolemia: Secondary | ICD-10-CM | POA: Diagnosis not present

## 2014-11-04 DIAGNOSIS — M150 Primary generalized (osteo)arthritis: Secondary | ICD-10-CM | POA: Diagnosis not present

## 2014-11-04 DIAGNOSIS — R1310 Dysphagia, unspecified: Secondary | ICD-10-CM | POA: Diagnosis not present

## 2014-11-04 DIAGNOSIS — K224 Dyskinesia of esophagus: Secondary | ICD-10-CM | POA: Diagnosis not present

## 2014-11-10 ENCOUNTER — Ambulatory Visit (INDEPENDENT_AMBULATORY_CARE_PROVIDER_SITE_OTHER): Payer: Medicare Other | Admitting: Physician Assistant

## 2014-11-10 ENCOUNTER — Encounter: Payer: Self-pay | Admitting: Physician Assistant

## 2014-11-10 VITALS — BP 110/66 | HR 72 | Ht 62.0 in | Wt 147.0 lb

## 2014-11-10 DIAGNOSIS — K228 Other specified diseases of esophagus: Secondary | ICD-10-CM

## 2014-11-10 DIAGNOSIS — R1314 Dysphagia, pharyngoesophageal phase: Secondary | ICD-10-CM | POA: Diagnosis not present

## 2014-11-10 DIAGNOSIS — K2289 Other specified disease of esophagus: Secondary | ICD-10-CM

## 2014-11-10 MED ORDER — HYOSCYAMINE SULFATE 0.125 MG SL SUBL: SUBLINGUAL_TABLET | SUBLINGUAL | Status: DC

## 2014-11-10 NOTE — Progress Notes (Signed)
Patient ID: [name], female   DOB: 08-10-1925, 81 y.o.   MRN: [id number]   Subjective:    Patient ID: [name], female    DOB: June 16, 1925, 81 y.o.   MRN: [id number]  HPI [name] is a very nice 81 year old white female [id number] previous procedures. She had undergone colonoscopy in 2008 with finding of diverticulosis and 1 small polyp. She is referred today by Dr. Loreen Freud in for evaluation of dysphagia. Patient has multiple medical problems including coronary artery disease, hypertension, COPD, history of hepatic artery aneurysms, diverticulosis, atrial fibrillation, CHF, history of CVA, status post pacemaker placement and is chronically anticoagulated. Patient states that she started having some difficulty swallowing over the past few months. She says this is not severe and not occurring every meal or every day. He was to feel that her symptoms are aggravated by stress and says she has been under a lot of increased stress recently due to her husband's illness and reluctance to have a surgery which she needed. Apparently he underwent this surgery earlier this week and is doing well. She describes a sensation of food sitting in her esophagus and had been having some difficulty with solids and liquids. Occasionally would have regurgitation of some foamy material but generally not food. She says that time she would have to stop eating to allow food to pass. She has no complaints of pain and no odynophagia no heartburn indigestion or abdominal discomfort. She had undergone upper GI earlier this month which shows decreased esophageal motility with a mildly dilated esophagus and stasis in the midesophagus there is no significant hiatal hernia no stricture or other lesion.  Review of Systems  Pertinent positive and negative review of systems were noted in the above HPI section.  All other review of systems was otherwise negative.  Outpatient Encounter Prescriptions as of 11/10/2014  Medication Sig    . acetaminophen (TYLENOL) 325 MG tablet Take 2 tablets (650 mg total) by mouth every 6 (six) hours as needed for mild pain or moderate pain.  . calcitonin, salmon, (MIACALCIN/FORTICAL) 200 UNIT/ACT nasal spray Place 1 mL into alternate nostrils as needed.  . Calcium Carbonate-Vitamin D (CALCIUM + D PO) Take 1 tablet by mouth 2 (two) times daily.  . dabigatran (PRADAXA) 75 MG CAPS capsule Take 1 capsule (75 mg total) by mouth every 12 (twelve) hours.  . dofetilide (TIKOSYN) 250 MCG capsule take 1 capsule by mouth twice a day (SCHEDULE IS 8AM AND 8PM,CANNOT VARY MORE THAN 1 HOUR)  . fluorometholone (FML) 0.1 % ophthalmic suspension Place 1 drop into both eyes as needed.   . furosemide (LASIX) 40 MG tablet take 1 tablet by mouth once daily  . HYDROcodone-acetaminophen (NORCO/VICODIN) 5-325 MG per tablet Take 1 tablet by mouth every 6 (six) hours as needed.  . magnesium oxide (MAG-OX) 400 MG tablet Take 1 tablet (400 mg total) by mouth daily. (Patient taking differently: Take 400 mg by mouth daily after lunch. )  . metoprolol succinate (TOPROL-XL) 50 MG 24 hr tablet Take 1 tablet (50 mg total) by mouth 2 (two) times daily.  . Multiple Vitamins-Minerals (MULTIVITAMINS THER. W/MINERALS) TABS Take 1 tablet by mouth every morning.    . Omega-3 Fatty Acids (FISH OIL) 1000 MG CAPS Take 1,000 mg by mouth 2 (two) times daily.   . ranitidine (ZANTAC) 150 MG capsule Take 150 mg by mouth every morning.   . rosuvastatin (CRESTOR) 5 MG tablet Take 1 tablet (5 mg total) by mouth every evening.  Marland Kitchen  senna-docusate (SENOKOT-S) 8.6-50 MG per tablet Take 2 tablets by mouth at bedtime. For constipation  . sucralfate (CARAFATE) 1 G tablet Take 1 tablet (1 g total) by mouth 4 (four) times daily.  . hyoscyamine (LEVSIN/SL) 0.125 MG SL tablet Place 1 tablet on the tongue before meals as needed for difficulty swallowing.  . [DISCONTINUED] cephALEXin (KEFLEX) 500 MG capsule Take 1 capsule by mouth 3 (three) times daily.  .  [DISCONTINUED] diclofenac sodium (VOLTAREN) 1 % GEL Apply 2 g topically 3 (three) times daily.  . [DISCONTINUED] Menthol-Methyl Salicylate (MUSCLE RUB) 10-15 % CREA Apply 1 application topically as needed for muscle pain.  . [DISCONTINUED] ondansetron (ZOFRAN ODT) 4 MG disintegrating tablet Take 1 tablet (4 mg total) by mouth every 8 (eight) hours as needed for nausea.   No facility-administered encounter medications on file as of 11/10/2014.   Allergies  Allergen Reactions  . Protonix [Pantoprazole Sodium] Other (See Comments)    Abdominal pain  . Tramadol Other (See Comments)    Medication made her be off and talk to self   Patient Active Problem List   Diagnosis Date Noted  . Valvular heart disease   . Abdominal aortic aneurysm   . Chest pain, musculoskeletal 09/16/2014  . Pelvic fracture 09/16/2014  . Fracture of left superior pubic ramus 09/14/2014  . Contusion of forehead 09/14/2014  . Calculus of bile duct without mention of cholecystitis or obstruction 06/23/2013  . Pacemaker 12/07/2012  . Hyperlipidemia 12/07/2012  . Cerebral embolism with cerebral infarction 01/22/2012  . Chest pain of pericarditis 01/20/2012  . Acute CHF, presumed secondary to Rt heart failure 01/20/2012  . Near syncope 01/17/2012  . Cardiac tamponade, recurrent episode, admitted 9/5, but now felt to be pericarditits 01/17/2012  . PAF (paroxysmal atrial fibrillation) 01/05/2012  . CAD, mild by cath 2001 and 09/04/11 09/05/2011  . COPD, by CXR, followed by Dr Annamaria Boots 09/05/2011  . Syncope, after NTG X 1 on admission 09/05/2011  . Chest pain 09/03/2011  . Supra theraputic INR 5.5 01/16/12 09/03/2011  . HTN (hypertension) 09/03/2011  . Nl coronaries 2001, low risk Myoview 2012 09/03/2011  . Dyspnea on exertion, secondary to AF 09/03/2011  . RBBB, intermittent 09/03/2011  . Personal history of colonic polyps 06/21/2011  . Right bundle branch block and left anterior fascicular block 03/19/2011  . Allergic  rhinitis due to pollen 07/17/2010  . GERD 10/25/2009  . ABDOMINAL PAIN-EPIGASTRIC 10/25/2009  . 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 EUSTACHIAN TUBE DYSFUNCTION 11/26/2007  . Persistent atrial fibrillation, failed DCCV 11/12, turned down for RFA 11/26/2007  . DIVERTICULOSIS, COLON 07/03/2006   History   Social History  . Marital Status: Married    Spouse Name: N/A  . Number of Children: 4  . Years of Education: N/A   Occupational History  . retired    Social History Main Topics  . Smoking status: Former Smoker    Quit date: 06/10/1982  . Smokeless tobacco: Never Used     Comment: former smoker x 22+ years, positive for second-hand smoke exposure  . Alcohol Use: No  . Drug Use: No  . Sexual Activity: Not on file   Other Topics Concern  . Not on file   Social History Narrative   Occasionally exercises   Rarely drinks caffeine   4 children, all boys   Lives in [place].    Ms. Haub's family history includes Arthritis in her sister; Atrial fibrillation in her sister; Heart disease in her mother; Heart  failure in her father; Prostate cancer in her father. There is no history of Colon cancer, Anesthesia problems, Hypotension, Malignant hyperthermia, or Pseudochol deficiency.      Objective:    Filed Vitals:   11/10/14 1442  BP: 110/66  Pulse: 72    Physical Exam  well-developed elderly white female in no acute distress, accompanied by her son blood pressure 110/66 pulse 72 height 5 foot 2 weight 147. HEENT nontraumatic normocephalic EOMI PERRLA sclera anicteric-not further examined today discussion only       Assessment & Plan:   #1 81 yo female with intermittent dysphagia and UGI showing esophageal dysmotility and some stasis, no stricture #2 multiple medical problems as outlined above  Plan; Soft diet chop meats, upright for meals and 2 hours after meals  Levsin sl prn   She will follow up with Dr. Deatra Ina as needed, no indication for EGD at  this time   Alfredia Ferguson PA-C 11/10/2014   Cc: Merrilee Seashore, MD

## 2014-11-10 NOTE — Patient Instructions (Signed)
We sent a prescription to [place].  1. Levsin SL tablets.  Eat a soft diet for now. Sit upright for 2 hours after meals.   Follow up with Dr. Erskine Emery or [facility] PA as needed.

## 2014-11-11 NOTE — Progress Notes (Signed)
Reviewed and agree with management. Robert D. Kaplan, M.D., FACG  

## 2014-11-18 DIAGNOSIS — S30202D Contusion of unspecified external genital organ, female, subsequent encounter: Secondary | ICD-10-CM | POA: Diagnosis not present

## 2014-11-18 DIAGNOSIS — R102 Pelvic and perineal pain: Secondary | ICD-10-CM | POA: Diagnosis not present

## 2014-11-18 DIAGNOSIS — S32512D Fracture of superior rim of left pubis, subsequent encounter for fracture with routine healing: Secondary | ICD-10-CM | POA: Diagnosis not present

## 2014-11-18 DIAGNOSIS — I4891 Unspecified atrial fibrillation: Secondary | ICD-10-CM | POA: Diagnosis not present

## 2014-11-21 LAB — CUP PACEART INCLINIC DEVICE CHECK
Brady Statistic RA Percent Paced: 30 %
Date Time Interrogation Session: 20160711112459
Lead Channel Impedance Value: 601 Ohm
Lead Channel Impedance Value: 684 Ohm
Lead Channel Pacing Threshold Amplitude: 0.5 V
Lead Channel Pacing Threshold Amplitude: 1.4 V
Lead Channel Pacing Threshold Pulse Width: 0.4 ms
Lead Channel Pacing Threshold Pulse Width: 0.5 ms
Lead Channel Sensing Intrinsic Amplitude: 10.9 mV
Lead Channel Sensing Intrinsic Amplitude: 6.7 mV
Lead Channel Setting Pacing Amplitude: 2.4 V
Lead Channel Setting Pacing Pulse Width: 0.5 ms
Lead Channel Setting Sensing Sensitivity: 2.5 mV
MDC IDC PG SERIAL: 144309
MDC IDC SET LEADCHNL RA PACING AMPLITUDE: 2 V
MDC IDC SET ZONE DETECTION INTERVAL: 375 ms
MDC IDC STAT BRADY RV PERCENT PACED: 19 %

## 2014-11-22 ENCOUNTER — Telehealth: Payer: Self-pay | Admitting: Internal Medicine

## 2014-11-22 ENCOUNTER — Ambulatory Visit (INDEPENDENT_AMBULATORY_CARE_PROVIDER_SITE_OTHER): Payer: Medicare Other

## 2014-11-22 DIAGNOSIS — J309 Allergic rhinitis, unspecified: Secondary | ICD-10-CM

## 2014-11-22 NOTE — Telephone Encounter (Signed)
Date Mixed: 11/22/2014 Vial: AB Strength: 1:10 Here/Mail/Pick Up: Here Mixed By: Nakul Avino, CMA 

## 2014-11-29 ENCOUNTER — Encounter: Payer: Self-pay | Admitting: Internal Medicine

## 2014-11-29 ENCOUNTER — Ambulatory Visit (INDEPENDENT_AMBULATORY_CARE_PROVIDER_SITE_OTHER): Payer: Medicare Other | Admitting: Internal Medicine

## 2014-11-29 VITALS — BP 140/82 | HR 77 | Ht 62.0 in | Wt 146.6 lb

## 2014-11-29 DIAGNOSIS — Z950 Presence of cardiac pacemaker: Secondary | ICD-10-CM | POA: Diagnosis not present

## 2014-11-29 DIAGNOSIS — I481 Persistent atrial fibrillation: Secondary | ICD-10-CM | POA: Diagnosis not present

## 2014-11-29 DIAGNOSIS — I701 Atherosclerosis of renal artery: Secondary | ICD-10-CM

## 2014-11-29 DIAGNOSIS — I4819 Other persistent atrial fibrillation: Secondary | ICD-10-CM

## 2014-11-29 LAB — CUP PACEART INCLINIC DEVICE CHECK
Brady Statistic RA Percent Paced: 24 %
Brady Statistic RV Percent Paced: 22 %
Lead Channel Setting Pacing Amplitude: 2 V
MDC IDC PG SERIAL: 144309
MDC IDC SESS DTM: 20160719040000
MDC IDC SET LEADCHNL RV PACING AMPLITUDE: 2.4 V
MDC IDC SET LEADCHNL RV PACING PULSEWIDTH: 0.5 ms
MDC IDC SET LEADCHNL RV SENSING SENSITIVITY: 2.5 mV
Zone Setting Detection Interval: 375 ms

## 2014-11-29 MED ORDER — RANOLAZINE ER 500 MG PO TB12: 500.0000 mg | ORAL_TABLET | Freq: Two times a day (BID) | ORAL | Status: DC
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
## 2014-11-29 NOTE — Patient Instructions (Addendum)
Medication Instructions:  Your physician has recommended you make the following change in your medication:  1) START Ranexa (ranolazine) 500 mg twice a day  Labwork: None ordered  Testing/Procedures: None ordered  Follow-Up: Your physician recommends that you schedule a follow-up appointment in: 2 months with Dr. Caryl Comes.  Any Other Special Instructions Will Be Listed Below (If Applicable). Dr. Caryl Comes recommends you use an abdominal binder.  Thank you for choosing [facility]!!

## 2014-11-30 ENCOUNTER — Other Ambulatory Visit: Payer: Self-pay | Admitting: Cardiovascular Disease

## 2014-12-19 ENCOUNTER — Encounter: Payer: Self-pay | Admitting: *Deleted

## 2014-12-19 ENCOUNTER — Telehealth: Payer: Self-pay | Admitting: Cardiovascular Disease

## 2014-12-19 NOTE — Telephone Encounter (Signed)
Spoke with patient who complains of low BP readings. She feels wiped out. She reports she feels that her heart has been in rhythm since starting ranexa (Dr. Caryl Comes prescribed). She states it is not unusual for her AM BP to be low before she drinks water. She has no symptoms with position changes as far as lightheadedness/dizziness. She denies chest pain, denies SOB.   8/5 129/79 8/6 94/60 >> 105/68 8/7 123/81 8/8 91/49 >> 85/46  She has tentatively been scheduled to see B. Samara Snide, [place] tomorrow 8/9 @ 130pm but would like Dr. Lurline Del opinion to see if he has any recommendations/feels like in office eval is appropriate.

## 2014-12-19 NOTE — Telephone Encounter (Signed)
Patient states that her blood pressure has dropped since she got up this morning.  She has drank water, juice and coffee and it is 91/49.  Please call.

## 2014-12-19 NOTE — Telephone Encounter (Signed)
Spoke with patient and informed her of Dr. Lurline Del advice. She will sent transmission now and await call to see if metoprolol dose can be decreased. She will stay hydrated.

## 2014-12-19 NOTE — Telephone Encounter (Signed)
Routed to B. Lassiter as Juluis Rainier

## 2014-12-19 NOTE — Telephone Encounter (Signed)
If arrhythmia is truly better controlled, we could reduce metoprolol dose and that might help. Would like to see pacemaker check and some objective evidence that rhythm is better controlled first. It is indeed important that she stays well hydrated.

## 2014-12-19 NOTE — Telephone Encounter (Signed)
Per Dr. Loletha Grayer, device transmission did not indicate that she could decrease metoprolol. She will stay on current dose and stay well hydrated. She will let us know if her BP stays low. She requested that her appointment with B. [place], [place] 8/9 @ 130pm be cancelled.

## 2014-12-20 ENCOUNTER — Ambulatory Visit: Payer: Medicare Other | Admitting: Physician Assistant

## 2014-12-21 NOTE — Telephone Encounter (Signed)
Opened in error

## 2014-12-25 ENCOUNTER — Other Ambulatory Visit: Payer: Self-pay | Admitting: Cardiovascular Disease

## 2014-12-26 NOTE — Telephone Encounter (Signed)
sucralfate refused - defer to PCP or GI

## 2015-01-06 ENCOUNTER — Encounter: Payer: Self-pay | Admitting: Internal Medicine

## 2015-01-07 ENCOUNTER — Other Ambulatory Visit: Payer: Self-pay | Admitting: Cardiovascular Disease

## 2015-01-09 NOTE — Telephone Encounter (Signed)
REFILL 

## 2015-01-12 ENCOUNTER — Telehealth: Payer: Self-pay | Admitting: Internal Medicine

## 2015-01-12 NOTE — Telephone Encounter (Signed)
Pt.'s last all. Shot was 09/06/14. At her 02/16/14 visit you gave her 3 options,the third one being to stop shots and watch & see how you do. I'm guessing that's what she did. Please advise.

## 2015-01-12 NOTE — Telephone Encounter (Signed)
She has had other health problems. At age 81 it is time for her to be off allergy shots. If she contacts [place] we can reassess, but I would not remix now.

## 2015-01-13 NOTE — Telephone Encounter (Signed)
Thanks for letting me know. I will consider her d/c'd unless she contact us. Nothing further needed.

## 2015-01-23 ENCOUNTER — Encounter: Payer: Self-pay | Admitting: Cardiovascular Disease

## 2015-01-30 ENCOUNTER — Ambulatory Visit (INDEPENDENT_AMBULATORY_CARE_PROVIDER_SITE_OTHER): Payer: Medicare Other | Admitting: Internal Medicine

## 2015-01-30 ENCOUNTER — Encounter: Payer: Self-pay | Admitting: Internal Medicine

## 2015-01-30 VITALS — BP 110/89 | HR 70 | Ht 62.0 in | Wt 142.8 lb

## 2015-01-30 DIAGNOSIS — I480 Paroxysmal atrial fibrillation: Secondary | ICD-10-CM | POA: Diagnosis not present

## 2015-01-30 DIAGNOSIS — I452 Bifascicular block: Secondary | ICD-10-CM | POA: Diagnosis not present

## 2015-01-30 DIAGNOSIS — I701 Atherosclerosis of renal artery: Secondary | ICD-10-CM | POA: Diagnosis not present

## 2015-01-30 LAB — CUP PACEART INCLINIC DEVICE CHECK
Brady Statistic RA Percent Paced: 25 %
Date Time Interrogation Session: 20160919040000
Lead Channel Impedance Value: 669 Ohm
Lead Channel Pacing Threshold Amplitude: 1.2 V
Lead Channel Pacing Threshold Pulse Width: 0.5 ms
Lead Channel Setting Pacing Amplitude: 2 V
Lead Channel Setting Pacing Pulse Width: 0.5 ms
Lead Channel Setting Sensing Sensitivity: 2.5 mV
MDC IDC MSMT LEADCHNL RA IMPEDANCE VALUE: 637 Ohm
MDC IDC MSMT LEADCHNL RA SENSING INTR AMPL: 7.5 mV
MDC IDC PG SERIAL: 144309
MDC IDC SET LEADCHNL RV PACING AMPLITUDE: 2.4 V
MDC IDC SET ZONE DETECTION INTERVAL: 375 ms
MDC IDC STAT BRADY RV PERCENT PACED: 50 %
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
## 2015-01-30 NOTE — Patient Instructions (Signed)
Medication Instructions: - no changes  Labwork: - none  Procedures/Testing: - none  Follow-Up: - Dr. Klein will see you back on an as needed basis.  Any Additional Special Instructions Will Be Listed Below (If Applicable). - none  

## 2015-02-06 DIAGNOSIS — I10 Essential (primary) hypertension: Secondary | ICD-10-CM | POA: Diagnosis not present

## 2015-02-06 DIAGNOSIS — R011 Cardiac murmur, unspecified: Secondary | ICD-10-CM | POA: Diagnosis not present

## 2015-02-06 DIAGNOSIS — E782 Mixed hyperlipidemia: Secondary | ICD-10-CM | POA: Diagnosis not present

## 2015-02-06 DIAGNOSIS — I480 Paroxysmal atrial fibrillation: Secondary | ICD-10-CM | POA: Diagnosis not present

## 2015-02-07 ENCOUNTER — Telehealth: Payer: Self-pay | Admitting: *Deleted

## 2015-02-07 ENCOUNTER — Encounter: Payer: Self-pay | Admitting: Cardiovascular Disease

## 2015-02-07 NOTE — Telephone Encounter (Signed)
Requesting surgical clearance:   1. Type of surgery: tooth extraction (tooth #22)  2. Surgeon: Feliberto Harts, DDS  3. Surgical date: not scheduled until cleared  4. Medications that need to be held: Pradaxa

## 2015-02-07 NOTE — Telephone Encounter (Signed)
Clearance letter faxed with authorization to hold Pradaxa 24 hours prior to dental extraction and restart ASAP, to Feliberto Harts, [place].

## 2015-02-07 NOTE — Telephone Encounter (Signed)
Letter in [employer]. Dr. Romie Minus not in [facility]. Please fax.

## 2015-02-09 DIAGNOSIS — I951 Orthostatic hypotension: Secondary | ICD-10-CM | POA: Diagnosis not present

## 2015-02-09 DIAGNOSIS — I480 Paroxysmal atrial fibrillation: Secondary | ICD-10-CM | POA: Diagnosis not present

## 2015-02-09 DIAGNOSIS — E782 Mixed hyperlipidemia: Secondary | ICD-10-CM | POA: Diagnosis not present

## 2015-02-09 DIAGNOSIS — I10 Essential (primary) hypertension: Secondary | ICD-10-CM | POA: Diagnosis not present

## 2015-02-09 DIAGNOSIS — Z23 Encounter for immunization: Secondary | ICD-10-CM | POA: Diagnosis not present

## 2015-02-10 NOTE — Telephone Encounter (Signed)
Surgical clearance faxed to Dr. Romie Minus.

## 2015-02-21 ENCOUNTER — Ambulatory Visit: Payer: Medicare Other | Admitting: Internal Medicine

## 2015-02-27 ENCOUNTER — Other Ambulatory Visit: Payer: Self-pay | Admitting: Internal Medicine

## 2015-02-27 DIAGNOSIS — Z950 Presence of cardiac pacemaker: Secondary | ICD-10-CM

## 2015-02-27 DIAGNOSIS — I4819 Other persistent atrial fibrillation: Secondary | ICD-10-CM

## 2015-02-27 MED ORDER — RANOLAZINE ER 500 MG PO TB12: 500.0000 mg | ORAL_TABLET | Freq: Two times a day (BID) | ORAL | Status: DC

## 2015-03-14 ENCOUNTER — Telehealth: Payer: Self-pay | Admitting: Cardiovascular Disease

## 2015-03-14 NOTE — Telephone Encounter (Signed)
[name] is calling to see if she can change her Pradaxa and wants to discuss it with Dr. Sallyanne Kuster . Please call   Thanks

## 2015-03-14 NOTE — Telephone Encounter (Signed)
Pt states she saw Dr Caryl Comes in September. Pt states that Dr Caryl Comes told her to ask Dr Sallyanne Kuster about changing her Pradaxa. Pt states she is not sure why Dr Caryl Comes was recommending changing Pradaxa. Pt states she has an appt with Dr Sallyanne Kuster in December and wanted him to have this information. Pt advised I will forward to Dr Sallyanne Kuster for review.   FOR DEVICE CLINIC:  Pt states that she had an episode of hard fast heart beat on Sunday 03/12/15 about 10-11 AM. Pt states that it lasted just a few minutes, she did get lightheaded, she states she did not send in a transmission. Pt advised I will forward to [facility] for possible interrogation of her device to follow up on episode of fast hard heart beat on Sunday.

## 2015-03-14 NOTE — Telephone Encounter (Signed)
Latitude remote shows pt in AF 55% of time. Pt did not have an isolated episode on 03/12/15 about 10-11 AM, however, she did have a 28-hour AF episode that began 03/11/15 at 18:47. Her average V rate during that 28-hr episode was 72 bpm. As of today, she is currently in an AF episode which has been onging since 03/12/15 at 23:11.   I did not call the pt regarding these results.

## 2015-03-15 NOTE — Telephone Encounter (Signed)
Thank you :)

## 2015-03-17 ENCOUNTER — Encounter: Payer: Self-pay | Admitting: Cardiovascular Disease

## 2015-03-20 ENCOUNTER — Encounter: Payer: Self-pay | Admitting: Internal Medicine

## 2015-04-12 DIAGNOSIS — L570 Actinic keratosis: Secondary | ICD-10-CM | POA: Diagnosis not present

## 2015-04-19 ENCOUNTER — Emergency Department (HOSPITAL_COMMUNITY): Payer: Medicare Other

## 2015-04-19 ENCOUNTER — Encounter (HOSPITAL_COMMUNITY): Payer: Self-pay | Admitting: *Deleted

## 2015-04-19 ENCOUNTER — Observation Stay (HOSPITAL_COMMUNITY)
Admission: EM | Admit: 2015-04-19 | Discharge: 2015-04-21 | Disposition: A | Discharge status: Home / Self Care | Payer: Medicare Other | Attending: Internal Medicine | Admitting: Internal Medicine

## 2015-04-19 DIAGNOSIS — I728 Aneurysm of other specified arteries: Secondary | ICD-10-CM | POA: Diagnosis not present

## 2015-04-19 DIAGNOSIS — J449 Chronic obstructive pulmonary disease, unspecified: Secondary | ICD-10-CM | POA: Diagnosis not present

## 2015-04-19 DIAGNOSIS — E7800 Pure hypercholesterolemia, unspecified: Secondary | ICD-10-CM | POA: Diagnosis present

## 2015-04-19 DIAGNOSIS — M1990 Unspecified osteoarthritis, unspecified site: Secondary | ICD-10-CM | POA: Diagnosis not present

## 2015-04-19 DIAGNOSIS — R0602 Shortness of breath: Secondary | ICD-10-CM | POA: Insufficient documentation

## 2015-04-19 DIAGNOSIS — R079 Chest pain, unspecified: Secondary | ICD-10-CM | POA: Diagnosis not present

## 2015-04-19 DIAGNOSIS — I340 Nonrheumatic mitral (valve) insufficiency: Secondary | ICD-10-CM | POA: Diagnosis not present

## 2015-04-19 DIAGNOSIS — Z885 Allergy status to narcotic agent status: Secondary | ICD-10-CM | POA: Diagnosis not present

## 2015-04-19 DIAGNOSIS — I10 Essential (primary) hypertension: Secondary | ICD-10-CM | POA: Insufficient documentation

## 2015-04-19 DIAGNOSIS — Z8719 Personal history of other diseases of the digestive system: Secondary | ICD-10-CM | POA: Diagnosis not present

## 2015-04-19 DIAGNOSIS — Z85820 Personal history of malignant melanoma of skin: Secondary | ICD-10-CM | POA: Insufficient documentation

## 2015-04-19 DIAGNOSIS — I714 Abdominal aortic aneurysm, without rupture: Secondary | ICD-10-CM | POA: Insufficient documentation

## 2015-04-19 DIAGNOSIS — K219 Gastro-esophageal reflux disease without esophagitis: Secondary | ICD-10-CM | POA: Diagnosis not present

## 2015-04-19 DIAGNOSIS — I361 Nonrheumatic tricuspid (valve) insufficiency: Secondary | ICD-10-CM | POA: Diagnosis not present

## 2015-04-19 DIAGNOSIS — I481 Persistent atrial fibrillation: Secondary | ICD-10-CM | POA: Insufficient documentation

## 2015-04-19 DIAGNOSIS — Z950 Presence of cardiac pacemaker: Secondary | ICD-10-CM | POA: Diagnosis not present

## 2015-04-19 DIAGNOSIS — R0789 Other chest pain: Secondary | ICD-10-CM | POA: Diagnosis not present

## 2015-04-19 DIAGNOSIS — K449 Diaphragmatic hernia without obstruction or gangrene: Secondary | ICD-10-CM | POA: Insufficient documentation

## 2015-04-19 DIAGNOSIS — Z888 Allergy status to other drugs, medicaments and biological substances status: Secondary | ICD-10-CM | POA: Insufficient documentation

## 2015-04-19 DIAGNOSIS — Z87891 Personal history of nicotine dependence: Secondary | ICD-10-CM | POA: Insufficient documentation

## 2015-04-19 DIAGNOSIS — Z79899 Other long term (current) drug therapy: Secondary | ICD-10-CM | POA: Insufficient documentation

## 2015-04-19 DIAGNOSIS — E785 Hyperlipidemia, unspecified: Secondary | ICD-10-CM | POA: Insufficient documentation

## 2015-04-19 DIAGNOSIS — Z86010 Personal history of colonic polyps: Secondary | ICD-10-CM | POA: Diagnosis not present

## 2015-04-19 DIAGNOSIS — I119 Hypertensive heart disease without heart failure: Secondary | ICD-10-CM | POA: Insufficient documentation

## 2015-04-19 DIAGNOSIS — I4819 Other persistent atrial fibrillation: Secondary | ICD-10-CM | POA: Diagnosis present

## 2015-04-19 DIAGNOSIS — I480 Paroxysmal atrial fibrillation: Secondary | ICD-10-CM | POA: Diagnosis not present

## 2015-04-19 DIAGNOSIS — I351 Nonrheumatic aortic (valve) insufficiency: Secondary | ICD-10-CM | POA: Diagnosis not present

## 2015-04-19 DIAGNOSIS — E8770 Fluid overload, unspecified: Secondary | ICD-10-CM | POA: Diagnosis not present

## 2015-04-19 DIAGNOSIS — I2510 Atherosclerotic heart disease of native coronary artery without angina pectoris: Secondary | ICD-10-CM | POA: Insufficient documentation

## 2015-04-19 LAB — I-STAT TROPONIN, ED: TROPONIN I, POC: 0.02 ng/mL (ref 0.00–0.08)

## 2015-04-19 LAB — CBC WITH DIFFERENTIAL/PLATELET
BASOS ABS: 0 10*3/uL (ref 0.0–0.1)
BASOS PCT: 1 %
Eosinophils Absolute: 0.1 10*3/uL (ref 0.0–0.7)
Eosinophils Relative: 1 %
HEMATOCRIT: 46.9 % — AB (ref 36.0–46.0)
HEMOGLOBIN: 15.9 g/dL — AB (ref 12.0–15.0)
Lymphocytes Relative: 21 %
Lymphs Abs: 1.6 10*3/uL (ref 0.7–4.0)
MCH: 33.4 pg (ref 26.0–34.0)
MCHC: 33.9 g/dL (ref 30.0–36.0)
MCV: 98.5 fL (ref 78.0–100.0)
Monocytes Absolute: 0.7 10*3/uL (ref 0.1–1.0)
Monocytes Relative: 9 %
NEUTROS ABS: 5.5 10*3/uL (ref 1.7–7.7)
NEUTROS PCT: 68 %
Platelets: 169 10*3/uL (ref 150–400)
RBC: 4.76 MIL/uL (ref 3.87–5.11)
RDW: 12.8 % (ref 11.5–15.5)
WBC: 7.9 10*3/uL (ref 4.0–10.5)

## 2015-04-19 LAB — BASIC METABOLIC PANEL
Anion gap: 7 (ref 5–15)
BUN: 16 mg/dL (ref 6–20)
CO2: 24 mmol/L (ref 22–32)
CREATININE: 0.98 mg/dL (ref 0.44–1.00)
Calcium: 9.1 mg/dL (ref 8.9–10.3)
Chloride: 108 mmol/L (ref 101–111)
GFR, EST AFRICAN AMERICAN: 58 mL/min — AB (ref >60)
GFR, EST NON AFRICAN AMERICAN: 50 mL/min — AB (ref >60)
GLUCOSE: 103 mg/dL — AB (ref 65–99)
Potassium: 4.5 mmol/L (ref 3.5–5.1)
SODIUM: 139 mmol/L (ref 135–145)

## 2015-04-19 LAB — TROPONIN I
Troponin I: <0.03 ng/mL (ref <0.031)
Troponin I: <0.03 ng/mL (ref <0.031)

## 2015-04-19 LAB — BRAIN NATRIURETIC PEPTIDE: B Natriuretic Peptide: 439.6 pg/mL — ABNORMAL HIGH (ref 0.0–100.0)

## 2015-04-19 MED ORDER — MAGNESIUM OXIDE 400 (241.3 MG) MG PO TABS
400.0000 mg | ORAL_TABLET | Freq: Every day | ORAL | Status: DC
  Administered 2015-04-20: 400 mg via ORAL
  Filled 2015-04-19 (×3): qty 1

## 2015-04-19 MED ORDER — ASPIRIN EC 325 MG PO TBEC: 325.0000 mg | DELAYED_RELEASE_TABLET | Freq: Once | ORAL | Status: DC

## 2015-04-19 MED ORDER — DOFETILIDE 250 MCG PO CAPS
250.0000 ug | ORAL_CAPSULE | Freq: Two times a day (BID) | ORAL | Status: DC
  Administered 2015-04-19: 250 ug via ORAL
  Filled 2015-04-19 (×4): qty 1

## 2015-04-19 MED ORDER — GI COCKTAIL ~~LOC~~: 30.0000 mL | Freq: Four times a day (QID) | ORAL | Status: DC | PRN

## 2015-04-19 MED ORDER — FLUOROMETHOLONE 0.1 % OP SUSP
1.0000 [drp] | Freq: Every day | OPHTHALMIC | Status: DC | PRN
Start: 2015-04-19 — End: 2015-04-19

## 2015-04-19 MED ORDER — ACETAMINOPHEN 325 MG PO TABS: 650.0000 mg | ORAL_TABLET | Freq: Four times a day (QID) | ORAL | Status: DC | PRN

## 2015-04-19 MED ORDER — ONDANSETRON HCL 4 MG/2ML IJ SOLN: 4.0000 mg | Freq: Four times a day (QID) | INTRAMUSCULAR | Status: DC | PRN

## 2015-04-19 MED ORDER — DOFETILIDE 250 MCG PO CAPS
250.0000 ug | ORAL_CAPSULE | Freq: Once | ORAL | Status: AC
  Administered 2015-04-19: 250 ug via ORAL
  Filled 2015-04-19: qty 1

## 2015-04-19 MED ORDER — FUROSEMIDE 20 MG PO TABS: 20.0000 mg | ORAL_TABLET | Freq: Every day | ORAL | Status: DC

## 2015-04-19 MED ORDER — DABIGATRAN ETEXILATE MESYLATE 75 MG PO CAPS
75.0000 mg | ORAL_CAPSULE | Freq: Two times a day (BID) | ORAL | Status: DC
  Administered 2015-04-19 – 2015-04-20 (×2): 75 mg via ORAL
  Filled 2015-04-19 (×4): qty 1

## 2015-04-19 MED ORDER — POTASSIUM CHLORIDE CRYS ER 20 MEQ PO TBCR
20.0000 meq | EXTENDED_RELEASE_TABLET | Freq: Every day | ORAL | Status: DC
  Administered 2015-04-19 – 2015-04-21 (×3): 20 meq via ORAL
  Filled 2015-04-19 (×4): qty 1

## 2015-04-19 MED ORDER — SODIUM CHLORIDE 0.45 % IV SOLN
INTRAVENOUS | Status: DC
  Administered 2015-04-19: 16:00:00 via INTRAVENOUS

## 2015-04-19 MED ORDER — ROSUVASTATIN CALCIUM 5 MG PO TABS
5.0000 mg | ORAL_TABLET | Freq: Every evening | ORAL | Status: DC
  Administered 2015-04-19 – 2015-04-20 (×2): 5 mg via ORAL
  Filled 2015-04-19 (×3): qty 1

## 2015-04-19 MED ORDER — REGADENOSON 0.4 MG/5ML IV SOLN
0.4000 mg | Freq: Once | INTRAVENOUS | Status: DC
  Filled 2015-04-19: qty 5

## 2015-04-19 MED ORDER — METOPROLOL SUCCINATE ER 50 MG PO TB24
50.0000 mg | ORAL_TABLET | Freq: Two times a day (BID) | ORAL | Status: DC
  Administered 2015-04-19 – 2015-04-21 (×5): 50 mg via ORAL
  Filled 2015-04-19 (×5): qty 1

## 2015-04-19 MED ORDER — FUROSEMIDE 10 MG/ML IJ SOLN
20.0000 mg | Freq: Once | INTRAMUSCULAR | Status: AC
  Administered 2015-04-19: 20 mg via INTRAVENOUS
  Filled 2015-04-19: qty 2

## 2015-04-19 MED ORDER — RANOLAZINE ER 500 MG PO TB12
500.0000 mg | ORAL_TABLET | Freq: Two times a day (BID) | ORAL | Status: DC
  Administered 2015-04-19 – 2015-04-20 (×3): 500 mg via ORAL
  Filled 2015-04-19 (×4): qty 1

## 2015-04-19 MED ORDER — SODIUM CHLORIDE 0.9 % IJ SOLN
3.0000 mL | Freq: Two times a day (BID) | INTRAMUSCULAR | Status: DC
  Administered 2015-04-19 – 2015-04-21 (×3): 3 mL via INTRAVENOUS

## 2015-04-19 MED ORDER — ACETAMINOPHEN 325 MG PO TABS: 650.0000 mg | ORAL_TABLET | ORAL | Status: DC | PRN

## 2015-04-19 MED ORDER — DOFETILIDE 250 MCG PO CAPS: 250.0000 ug | ORAL_CAPSULE | Freq: Two times a day (BID) | ORAL | Status: DC
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
## 2015-04-19 NOTE — ED Notes (Signed)
Contacted pharmacy and was told to hold floor meds until they are verified and rescheduled.
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
## 2015-04-19 NOTE — ED Notes (Signed)
Pt states she woke up with left sided chest pain that felt like a pressure 10/10 that radiated to center of chest.  Pt states SOB, dizziness, Nausea with slight diaphoresis.  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  GEMS:140/70, 80,  324 aspirin.  Pt states chest pain has resolved at this time.

## 2015-04-19 NOTE — ED Notes (Signed)
Attempted report 

## 2015-04-19 NOTE — ED Notes (Signed)
Requested meds from pharmacy  

## 2015-04-19 NOTE — ED Provider Notes (Signed)
I saw and evaluated the patient, reviewed the resident's note and I agree with the findings and plan.   EKG Interpretation   Date/Time:  Wednesday April 19 2015 09:06:27 EST Ventricular Rate:  70 PR Interval:  77 QRS Duration: 129 QT Interval:  486 QTC Calculation: 524 R Axis:   -91 Text Interpretation:  Ventricular-paced rhythm No further analysis  attempted due to paced rhythm No significant changes since prior EKGs  Confirmed by Khair Chasteen MD, Egidio Lofgren [phone]) on 04/19/2015 10:31:86 AM      81 year old female with history of hypertension, hyperlipidemia, atrial fibrillation status post pacemaker and ablation, fibromuscular dysplasia who presents to emergency department with chest pain. Reports onset of severe chest pressure this morning at 7:30 AM while at rest reading the newspaper. Lasted for about 20 minutes, and self resolved. Associated with shortness of breath and lightheadedness. He is asymptomatic on arrival to the emergency department. States she has had 2-3 episodes of this more recently while at rest. Says that she does not do much activity during the daytime, but has not had any symptoms with activity. Has a ventricularly paced rhythm with no evidence of acute ischemia. Has a negative troponin 1. Chest x-ray without acute cardiopulmonary processes. C/f anginal equivalent and high risk, so will admit to observation for cardiac rule out. Has risk factors for dissection/aneurysm, but currently asymptomatic, not hypertensive, with equal and symmetric pulses; thus, less likely to be 2/2 to this.   Forde Dandy, MD 04/19/15 1040

## 2015-04-19 NOTE — ED Notes (Signed)
Ordered diet tray 

## 2015-04-20 ENCOUNTER — Observation Stay (HOSPITAL_COMMUNITY): Payer: Medicare Other

## 2015-04-20 ENCOUNTER — Encounter (HOSPITAL_COMMUNITY): Payer: Self-pay | Admitting: General Practice

## 2015-04-20 DIAGNOSIS — I209 Angina pectoris, unspecified: Secondary | ICD-10-CM | POA: Diagnosis not present

## 2015-04-20 DIAGNOSIS — R079 Chest pain, unspecified: Secondary | ICD-10-CM | POA: Diagnosis not present

## 2015-04-20 DIAGNOSIS — E785 Hyperlipidemia, unspecified: Secondary | ICD-10-CM

## 2015-04-20 DIAGNOSIS — R931 Abnormal findings on diagnostic imaging of heart and coronary circulation: Secondary | ICD-10-CM

## 2015-04-20 DIAGNOSIS — I10 Essential (primary) hypertension: Secondary | ICD-10-CM

## 2015-04-20 DIAGNOSIS — I481 Persistent atrial fibrillation: Secondary | ICD-10-CM

## 2015-04-20 DIAGNOSIS — R0602 Shortness of breath: Secondary | ICD-10-CM | POA: Diagnosis not present

## 2015-04-20 LAB — NM MYOCAR MULTI W/SPECT W/WALL MOTION / EF
CSEPED: 11 min
CSEPPHR: 107 {beats}/min
Estimated workload: 1 METS
Exercise duration (sec): 17 s
MPHR: 133 {beats}/min
Percent HR: 80 %
Rest HR: 73 {beats}/min

## 2015-04-20 LAB — BASIC METABOLIC PANEL
ANION GAP: 6 (ref 5–15)
BUN: 21 mg/dL — ABNORMAL HIGH (ref 6–20)
CALCIUM: 8.4 mg/dL — AB (ref 8.9–10.3)
CO2: 25 mmol/L (ref 22–32)
Chloride: 106 mmol/L (ref 101–111)
Creatinine, Ser: 1.13 mg/dL — ABNORMAL HIGH (ref 0.44–1.00)
GFR, EST AFRICAN AMERICAN: 49 mL/min — AB (ref >60)
GFR, EST NON AFRICAN AMERICAN: 42 mL/min — AB (ref >60)
Glucose, Bld: 92 mg/dL (ref 65–99)
Potassium: 4.3 mmol/L (ref 3.5–5.1)
SODIUM: 137 mmol/L (ref 135–145)

## 2015-04-20 LAB — CBC
HEMATOCRIT: 40.4 % (ref 36.0–46.0)
HEMOGLOBIN: 13.4 g/dL (ref 12.0–15.0)
MCH: 32.8 pg (ref 26.0–34.0)
MCHC: 33.2 g/dL (ref 30.0–36.0)
MCV: 99 fL (ref 78.0–100.0)
Platelets: 153 10*3/uL (ref 150–400)
RBC: 4.08 MIL/uL (ref 3.87–5.11)
RDW: 12.8 % (ref 11.5–15.5)
WBC: 6.9 10*3/uL (ref 4.0–10.5)

## 2015-04-20 LAB — TROPONIN I: Troponin I: <0.03 ng/mL (ref <0.031)

## 2015-04-20 MED ORDER — REGADENOSON 0.4 MG/5ML IV SOLN
0.4000 mg | Freq: Once | INTRAVENOUS | Status: AC
  Administered 2015-04-20: 0.4 mg via INTRAVENOUS
  Filled 2015-04-20: qty 5

## 2015-04-20 MED ORDER — SODIUM CHLORIDE 0.9 % IV SOLN
INTRAVENOUS | Status: DC
  Administered 2015-04-21: 06:00:00 via INTRAVENOUS

## 2015-04-20 MED ORDER — SODIUM CHLORIDE 0.9 % IV SOLN: 250.0000 mL | INTRAVENOUS | Status: DC | PRN

## 2015-04-20 MED ORDER — TECHNETIUM TC 99M SESTAMIBI GENERIC - CARDIOLITE
30.0000 | Freq: Once | INTRAVENOUS | Status: AC | PRN
  Administered 2015-04-20: 30 via INTRAVENOUS

## 2015-04-20 MED ORDER — SODIUM CHLORIDE 0.9 % IJ SOLN
3.0000 mL | Freq: Two times a day (BID) | INTRAMUSCULAR | Status: DC
  Administered 2015-04-21 (×2): 3 mL via INTRAVENOUS

## 2015-04-20 MED ORDER — TECHNETIUM TC 99M SESTAMIBI GENERIC - CARDIOLITE
10.0000 | Freq: Once | INTRAVENOUS | Status: AC | PRN
  Administered 2015-04-20: 10 via INTRAVENOUS

## 2015-04-20 MED ORDER — REGADENOSON 0.4 MG/5ML IV SOLN
INTRAVENOUS | Status: AC
  Filled 2015-04-20: qty 5

## 2015-04-20 MED ORDER — SODIUM CHLORIDE 0.9 % IJ SOLN: 3.0000 mL | INTRAMUSCULAR | Status: DC | PRN

## 2015-04-20 MED ORDER — HYDRALAZINE HCL 20 MG/ML IJ SOLN: 10.0000 mg | INTRAMUSCULAR | Status: DC | PRN

## 2015-04-20 MED ORDER — DOFETILIDE 250 MCG PO CAPS
250.0000 ug | ORAL_CAPSULE | Freq: Two times a day (BID) | ORAL | Status: DC
Start: 2015-04-20 — End: 2015-04-21
  Administered 2015-04-20 – 2015-04-21 (×3): 250 ug via ORAL
  Filled 2015-04-20 (×5): qty 1

## 2015-04-20 MED ORDER — ASPIRIN 81 MG PO CHEW
81.0000 mg | CHEWABLE_TABLET | ORAL | Status: AC
  Administered 2015-04-21: 81 mg via ORAL
  Filled 2015-04-20: qty 1

## 2015-04-20 MED ORDER — HEPARIN (PORCINE) IN NACL 100-0.45 UNIT/ML-% IJ SOLN
900.0000 [IU]/h | INTRAMUSCULAR | Status: DC
  Administered 2015-04-21: 900 [IU]/h via INTRAVENOUS
  Filled 2015-04-20: qty 250

## 2015-04-20 MED ORDER — DEXTROSE 5 % IV SOLN
500.0000 mg | Freq: Once | INTRAVENOUS | Status: DC
  Filled 2015-04-20 (×2): qty 20

## 2015-04-20 NOTE — Discharge Instructions (Signed)
Information on my medicine - Pradaxa® (dabigatran) ° °This medication education was reviewed with me or my healthcare representative as part of my discharge preparation.  The pharmacist that spoke with me during my hospital stay was:  Lawrence Roldan Brown, RPH ° °Why was Pradaxa® prescribed for you? °Pradaxa® was prescribed for you to reduce the risk of forming blood clots that cause a stroke if you have a medical condition called atrial fibrillation (a type of irregular heartbeat).   ° °What do you Need to know about PradAXa®? °Take your Pradaxa® TWICE DAILY - one capsule in the morning and one tablet in the evening with or without food.  It would be best to take the doses about the same time each day. ° °The capsules should not be broken, chewed or opened - they must be swallowed whole. ° °Do not store Pradaxa in other medication containers - once the bottle is opened the Pradaxa should be used within FOUR months; throw away any capsules that haven’t been by that time. ° °Take Pradaxa® exactly as prescribed by your doctor.  DO NOT stop taking Pradaxa® without talking to the doctor who prescribed the medication.  Stopping without other stroke prevention medication to take the place of Pradaxa may increase your risk of developing a clot that causes a stroke.  Refill your prescription before you run out. ° °After discharge, you should have regular check-up appointments with your healthcare provider that is prescribing your Pradaxa®.  In the future your dose may need to be changed if your kidney function or weight changes by a significant amount. ° °What do you do if you miss a dose? °If you miss a dose, take it as soon as you remember on the same day.  If your next dose is less than 6 hours away, skip the missed dose.  Do not take two doses of PRADAXA at the same time. ° °Important Safety Information °A possible side effect of Pradaxa® is bleeding. You should call your healthcare provider right away if you  experience any of the following: °? Bleeding from an injury or your nose that does not stop. °? Unusual colored urine (red or dark brown) or unusual colored stools (red or black). °? Unusual bruising for unknown reasons. °? A serious fall or if you hit your head (even if there is no bleeding). ° °Some medicines may interact with Pradaxa® and might increase your risk of bleeding or clotting while on Pradaxa®. To help avoid this, consult your healthcare provider or pharmacist prior to using any new prescription or non-prescription medications, including herbals, vitamins, non-steroidal anti-inflammatory drugs (NSAIDs) and supplements. ° °This website has more information on Pradaxa® (dabigatran): [email or web address].pradaxa.com ° ° ° ° °

## 2015-04-20 NOTE — Progress Notes (Signed)
[facility] PROGRESS NOTE  [name] [id number] DOB: 1926/03/24 DOA: 04/19/2015 PCP: Merrilee Seashore, MD  Assessment/Plan:  Principal Problem:   Chest pain: Myoview as high risk. Discussed with Ms. Ahmed Prima. Cardiology to discuss treatment options. Active Problems:   Persistent atrial fibrillation, failed DCCV 11/12,    HTN (hypertension)   Pacemaker   Hyperlipidemia   Code Status:  full Family Communication:  Patient is lucid. No family available. Disposition Plan:  Home once ischemia workup complete  HPI/Subjective: No chest pain currently. Describes "twinge" of pain earlier today.  Objective: Filed Vitals:   04/20/15 1024 04/20/15 1131  BP: 137/91 160/80  Pulse: 75 73  Temp:  97.3 F (36.3 C)  Resp:      Intake/Output Summary (Last 24 hours) at 04/20/15 1426 Last data filed at 04/20/15 1323  Gross per 24 hour  Intake 1798.75 ml  Output   1375 ml  Net 423.75 ml   Filed Weights   04/19/15 0856 04/20/15 0503  Weight: 63.504 kg (140 lb) 63.504 kg (140 lb)    Exam:   General:  Alert, oriented. Talking on the telephone. Comfortable appearing.  Cardiovascular: Regular rate rhythm without murmurs gallops rubs  Respiratory: Clear to auscultation bilaterally without wheezes rhonchi or rales  Abdomen: s, nt, nondistended. Normal bowel sounds.  Ext: No clubbing cyanosis or edema.  Basic Metabolic Panel:  Recent Labs Lab 04/19/15 1000 04/20/15 0053  NA 139 137  K 4.5 4.3  CL 108 106  CO2 24 25  GLUCOSE 103* 92  BUN 16 21*  CREATININE 0.98 1.13*  CALCIUM 9.1 8.4*   Liver Function Tests: No results for input(s): AST, ALT, ALKPHOS, BILITOT, PROT, ALBUMIN in the last 168 hours. No results for input(s): LIPASE, AMYLASE in the last 168 hours. No results for input(s): AMMONIA in the last 168 hours. CBC:  Recent Labs Lab 04/19/15 1000 04/20/15 0053  WBC 7.9 6.9  NEUTROABS 5.5  --   HGB 15.9* 13.4  HCT 46.9* 40.4  MCV 98.5 99.0  PLT  169 153   Cardiac Enzymes:  Recent Labs Lab 04/19/15 1230 04/19/15 1917 04/20/15 0053  TROPONINI <0.03 <0.03 <0.03   BNP (last 3 results)  Recent Labs  04/19/15 1000  BNP 439.6*    ProBNP (last 3 results) No results for input(s): PROBNP in the last 8760 hours.  CBG: No results for input(s): GLUCAP in the last 168 hours.  No results found for this or any previous visit (from the past 240 hour(s)).   Studies: Nm Myocar Multi W/spect W/wall Motion / Ef  04/20/2015  CLINICAL DATA:  Chest pain.  Shortness of breath EXAM: MYOCARDIAL IMAGING WITH SPECT (REST AND PHARMACOLOGIC-STRESS) GATED LEFT VENTRICULAR WALL MOTION STUDY LEFT VENTRICULAR EJECTION FRACTION TECHNIQUE: Standard myocardial SPECT imaging was performed after resting intravenous injection of 10 mCi Tc-17m sestamibi. Subsequently, intravenous infusion of Lexiscan was performed under the supervision of the Cardiology staff. At peak effect of the drug, 30 mCi Tc-66m sestamibi was injected intravenously and standard myocardial SPECT imaging was performed. Quantitative gated imaging was also performed to evaluate left ventricular wall motion, and estimate left ventricular ejection fraction. COMPARISON:  None. FINDINGS: Perfusion: Small to moderate-size anteroapical perfusion defect on stress images compared to rest images. Large inferoseptal defect on stress images extends from the mid heart to the base. Wall Motion: Normal left ventricular wall motion. No left ventricular dilation. Left Ventricular Ejection Fraction: 79 % End diastolic volume 59 ml End systolic volume 12 ml IMPRESSION: 1. Suspected inducible  is a ischemia in the inferoseptal wall extending from the mid heart to the base. Small to moderate-sized enter apical perfusion defect on stress images also suspicious for inducible ischemia. 2. Normal left ventricular wall motion. 3. Left ventricular ejection fraction 79% 4. High-risk stress test findings*. *2012 Appropriate Use  Criteria for Coronary Revascularization Focused Update: J Am Coll Cardiol. [id number]. http://content.[email or web address].aspx?articleid=[email or web address] Electronically Signed   By: Van Clines M.D.   On: 04/20/2015 12:53   Dg Chest Portable 1 View  04/19/2015  CLINICAL DATA:  Chest pain beginning this morning. History of atrial fibrillation. EXAM: PORTABLE CHEST 1 VIEW COMPARISON:  09/14/2014 FINDINGS: Left pacer remains in place, unchanged. Heart and mediastinal contours are within normal limits. No focal opacities or effusions. No acute bony abnormality. IMPRESSION: No active cardiopulmonary disease. Electronically Signed   By: Rolm Baptise M.D.   On: 04/19/2015 09:30    Scheduled Meds: . aspirin EC  325 mg Oral Once  . dabigatran  75 mg Oral Q12H  . dofetilide  250 mcg Oral BID  . magnesium oxide  400 mg Oral QPC lunch  . metoprolol succinate  50 mg Oral BID  . potassium chloride SA  20 mEq Oral Daily  . ranolazine  500 mg Oral BID  . regadenoson      . regadenoson  0.4 mg Intravenous Once  . rosuvastatin  5 mg Oral QPM  . sodium chloride  3 mL Intravenous Q12H   Continuous Infusions:   Time spent: 35 minutes  [facility] [email or web address], password [facility] 04/20/2015, 2:26 PM

## 2015-04-20 NOTE — Progress Notes (Signed)
ANTICOAGULATION CONSULT NOTE - Initial Consult  Pharmacy Consult for heparin  Indication: atrial fibrillation  Allergies  Allergen Reactions  . Tramadol Other (See Comments)    Medication made her feel "off and talk to herself"  . Protonix [Pantoprazole Sodium] Other (See Comments)    Abdominal pain    Patient Measurements: Height: 5\' 2"  (157.5 cm) Weight: 140 lb (63.504 kg) IBW/kg (Calculated) : 50.1 Heparin Dosing Weight: 63 kg  Vital Signs: Temp: 97.3 F (36.3 C) (12/08 1131) Temp Source: Oral (12/08 0503) BP: 160/80 mmHg (12/08 1131) Pulse Rate: 73 (12/08 1131)  Labs:  Recent Labs  04/19/15 1000 04/19/15 1230 04/19/15 1917 04/20/15 0053  HGB 15.9*  --   --  13.4  HCT 46.9*  --   --  40.4  PLT 169  --   --  153  CREATININE 0.98  --   --  1.13*  TROPONINI  --  <0.03 <0.03 <0.03    Estimated Creatinine Clearance: 30.7 mL/min (by C-G formula based on Cr of 1.13).   Medical History: Past Medical History  Diagnosis Date  . Personal history of colonic polyps   . Diverticulosis of colon (without mention of hemorrhage)   . COPD (chronic obstructive pulmonary disease) ([place])   . Dysfunction of eustachian tube   . Persistent atrial fibrillation ([place])   . Allergic rhinitis   . Fibromuscular dysplasia ([place])   . 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 HTN (hypertension)   . Hyperlipidemia   . Cancer ([place])     melanoma in eye  . Blood transfusion   . GERD (gastroesophageal reflux disease)   . Arthritis   . Hiatal hernia   . Abdominal pain     due to Sequential arterial mediolysis.   Marland Kitchen PAF (paroxysmal atrial fibrillation), after a. fib ablation at [facility], now with RVR 01/05/2012  . Cardiac tamponade, recurrent episode, admitted 9/5, but now felt to be pericarditits 01/17/2012  . Pacemaker   . Shortness of breath     when coverts to AFib  . Heart murmur   . Head injury, acute, with loss of consciousness ([place])  1987     for 4 -5 days. Hit in head with a screw from a swing.  . Abdominal aortic aneurysm ([place])     3cm by CT 09/2014  . Valvular heart disease     mild AS, mild MR, mild-mod TR by echo 06/2014     Medications:  Prescriptions prior to admission  Medication Sig Dispense Refill Last Dose  . acetaminophen (TYLENOL) 325 MG tablet Take 2 tablets (650 mg total) by mouth every 6 (six) hours as needed for mild pain or moderate pain.   Past Month at Unknown time  . Calcium Carbonate-Vitamin D (CALCIUM + D PO) Take 1 tablet by mouth 2 (two) times daily.   04/18/2015 at Unknown time  . dofetilide (TIKOSYN) 250 MCG capsule Take 250 mcg by mouth 2 (two) times daily. Takes at 8 AM and 8 PM. Do not vary more than 1 hour.   04/18/2015 at Unknown time  . fluorometholone (FML) 0.1 % ophthalmic suspension Place 1 drop into both eyes daily as needed (dry eyes).    Past Month at Unknown time  . furosemide (LASIX) 40 MG tablet Take 20 mg by mouth daily.   04/18/2015 at Unknown time  . magnesium oxide (MAG-OX) 400 MG tablet Take 1 tablet (400 mg total) by mouth  daily. (Patient taking differently: Take 400 mg by mouth daily after lunch. ) 30 tablet 6 04/18/2015 at Unknown time  . metoprolol succinate (TOPROL-XL) 50 MG 24 hr tablet Take 1 tablet (50 mg total) by mouth 2 (two) times daily. 60 tablet 11 04/18/2015 at Unknown time  . Multiple Vitamins-Minerals (MULTIVITAMINS THER. W/MINERALS) TABS Take 1 tablet by mouth every morning.     04/18/2015 at Unknown time  . Omega-3 Fatty Acids (FISH OIL) 1000 MG CAPS Take 1,000 mg by mouth 2 (two) times daily.    04/18/2015 at Unknown time  . potassium chloride SA (K-DUR,KLOR-CON) 20 MEQ tablet Take 20 mEq by mouth daily.   1 04/18/2015 at Unknown time  . PRADAXA 75 MG CAPS capsule take 1 capsule by mouth every 12 hours 60 capsule 5 04/18/2015 at Unknown time  . ranitidine (ZANTAC) 150 MG capsule Take 150 mg by mouth every morning.    04/18/2015 at Unknown time  . ranolazine (RANEXA)  500 MG 12 hr tablet Take 1 tablet (500 mg total) by mouth 2 (two) times daily. 60 tablet 11 04/18/2015 at Unknown time  . rosuvastatin (CRESTOR) 5 MG tablet Take 1 tablet (5 mg total) by mouth every evening. 28 tablet 0 04/18/2015 at Unknown time  . NON FORMULARY Allergy vaccines 1:10 weekly GH   Taking    Assessment: 81 yo female on Pradaxa PTA for hx of AFib. Admitted with chest pain and SOB,  pt had stress test which was positive for possible LAD ischemia. Pt is planning to have L heart cath tomorrow. Last dose of Pradaxa was this morning at 1130. Pt has slight bump in SCr 0.7 baseline > 1.13, CBC is stable. Pt may require Pradaxa dose adjustment when this is resumed post-cath.  Goal of Therapy:  Heparin level 0.3-0.7 units/ml  aptt 66-102 Monitor platelets by anticoagulation protocol: Yes   Plan:  -Begin heparin at 900 units/hr at 2300 -Daily aPTT, HL, CBC -First aPTT in 6 hours -Monitor s/sx bleeding   Harvel Quale 04/20/2015,4:11 PM

## 2015-04-20 NOTE — Progress Notes (Signed)
Patient Name: [name] Date of Encounter: 04/20/2015  Principal Problem:   Chest pain Active Problems:   Persistent atrial fibrillation, failed DCCV 11/12, turned down for RFA   HTN (hypertension)   Pacemaker   Hyperlipidemia   Primary Cardiologist: Dr Sallyanne Kuster  Patient Profile: 81 yo female w/ hx PAF, BSci PPM, COPD, HTN, HL, mild AS, mild MR, mild-mod TR by echo 06/2014 and renal cell CA. Admitted 12/07 w/ chest pain. Vol overload by exam, given IV Lasix. MV planned.  SUBJECTIVE: No more chest pain, breathing much better today.  OBJECTIVE Filed Vitals:   04/19/15 1515 04/19/15 2055 04/20/15 0021 04/20/15 0503  BP: 103/69 112/60 116/65 152/81  Pulse: 70 70 71 74  Temp:   98.3 F (36.8 C) 97.8 F (36.6 C)  TempSrc:   Oral Oral  Resp: 22 20 20 21   Height:      Weight:    140 lb (63.504 kg)  SpO2: 96% 97% 98% 98%    Intake/Output Summary (Last 24 hours) at 04/20/15 1013 Last data filed at 04/20/15 0858  Gross per 24 hour  Intake 1558.75 ml  Output   1375 ml  Net 183.75 ml   Filed Weights   04/19/15 0856 04/20/15 0503  Weight: 140 lb (63.504 kg) 140 lb (63.504 kg)    PHYSICAL EXAM General: Well developed, well nourished, female in no acute distress. Head: Normocephalic, atraumatic.  Neck: Supple without bruits, JVD 9 cm, + hepatojugular reflux. Lungs:  Resp regular and unlabored, rales bases. Heart: RRR, S1, S2, no S3, S4, 2/6 murmur; no rub. Abdomen: Soft, non-tender, non-distended, BS + x 4.  Extremities: No clubbing, cyanosis, edema.  Neuro: Alert and oriented X 3. Moves all extremities spontaneously. Psych: Normal affect.  LABS: CBC: Recent Labs  04/19/15 1000 04/20/15 0053  WBC 7.9 6.9  NEUTROABS 5.5  --   HGB 15.9* 13.4  HCT 46.9* 40.4  MCV 98.5 99.0  PLT 169 0000000   Basic Metabolic Panel: Recent Labs  04/19/15 1000 04/20/15 0053  NA 139 137  K 4.5 4.3  CL 108 106  CO2 24 25  GLUCOSE 103* 92  BUN 16 21*  CREATININE 0.98 1.13*   CALCIUM 9.1 8.4*   Cardiac Enzymes: Recent Labs  04/19/15 1230 04/19/15 1917 04/20/15 0053  TROPONINI <0.03 <0.03 <0.03    Recent Labs  04/19/15 0923  TROPIPOC 0.02   BNP:  B NATRIURETIC PEPTIDE  Date/Time Value Ref Range Status  04/19/2015 10:00 AM 439.6* 0.0 - 100.0 pg/mL Final   TELE:  AV Pacing     Radiology/Studies: Dg Chest Portable 1 View 04/19/2015  CLINICAL DATA:  Chest pain beginning this morning. History of atrial fibrillation. EXAM: PORTABLE CHEST 1 VIEW COMPARISON:  09/14/2014 FINDINGS: Left pacer remains in place, unchanged. Heart and mediastinal contours are within normal limits. No focal opacities or effusions. No acute bony abnormality. IMPRESSION: No active cardiopulmonary disease. Electronically Signed   By: Rolm Baptise M.D.   On: 04/19/2015 09:30  Nm Myocar Multi W/spect W/wall Motion / Ef  04/20/2015  CLINICAL DATA:  Chest pain.  Shortness of breath EXAM: MYOCARDIAL IMAGING WITH SPECT (REST AND PHARMACOLOGIC-STRESS) GATED LEFT VENTRICULAR WALL MOTION STUDY LEFT VENTRICULAR EJECTION FRACTION TECHNIQUE: Standard myocardial SPECT imaging was performed after resting intravenous injection of 10 mCi Tc-63m sestamibi. Subsequently, intravenous infusion of Lexiscan was performed under the supervision of the Cardiology staff. At peak effect of the drug, 30 mCi Tc-68m sestamibi was injected intravenously and standard  myocardial SPECT imaging was performed. Quantitative gated imaging was also performed to evaluate left ventricular wall motion, and estimate left ventricular ejection fraction. COMPARISON:  None. FINDINGS: Perfusion: Small to moderate-size anteroapical perfusion defect on stress images compared to rest images. Large inferoseptal defect on stress images extends from the mid heart to the base. Wall Motion: Normal left ventricular wall motion. No left ventricular dilation. Left Ventricular Ejection Fraction: 79 % End diastolic volume 59 ml End systolic volume 12 ml  IMPRESSION: 1. Suspected inducible is a ischemia in the inferoseptal wall extending from the mid heart to the base. Small to moderate-sized enter apical perfusion defect on stress images also suspicious for inducible ischemia. 2. Normal left ventricular wall motion. 3. Left ventricular ejection fraction 79% 4. High-risk stress test findings*. *2012 Appropriate Use Criteria for Coronary Revascularization Focused Update: J Am Coll Cardiol. [id number]. http://content.[email or web address]?articleid=[email or web address] Electronically Signed   By: Van Clines M.D.   On: 04/20/2015 12:53   Current Medications:  . aspirin EC  325 mg Oral Once  . dabigatran  75 mg Oral Q12H  . dofetilide  250 mcg Oral BID  . magnesium oxide  400 mg Oral QPC lunch  . metoprolol succinate  50 mg Oral BID  . potassium chloride SA  20 mEq Oral Daily  . ranolazine  500 mg Oral BID  . regadenoson      . regadenoson  0.4 mg Intravenous Once  . rosuvastatin  5 mg Oral QPM  . sodium chloride  3 mL Intravenous Q12H   . sodium chloride 75 mL/hr at 04/19/15 1620    ASSESSMENT AND PLAN: Principal Problem:   Chest pain - ez neg MI, for MV today.   Active Problems:   Persistent atrial fibrillation, failed DCCV 11/12, turned down for RFA - currently AV pacing - on Tikosyn and Pradaxa    Volume overload - resp status improved after pt given IV Lasix - neck veins still elevated, rales bases, but CXR was OK and BUN/Cr up a little after Lasix - med changes per MD.    Abnl MV - No ongoing ischemic sx - MD review MV and advise plan  Otherwise, per IM   HTN (hypertension)   Pacemaker   Hyperlipidemia   Signed, Rosaria Ferries , PA-C 10:13 AM 04/20/2015

## 2015-04-20 NOTE — Progress Notes (Signed)
Lexiscan MV performed, 1 day study, [place] to read.  Pt had SOB, given aminophylline. Pt developed chest discomfort at the end of the test. Chest pain resolved in < 10 minutes, ECG paced.  Rosaria Ferries, Hershal Coria 04/20/2015 10:31 AM [place] [phone]

## 2015-04-21 ENCOUNTER — Encounter (HOSPITAL_COMMUNITY)
Admission: EM | Disposition: A | Discharge status: Home / Self Care | Payer: Self-pay | Source: Home / Self Care | Attending: Emergency Medicine

## 2015-04-21 ENCOUNTER — Other Ambulatory Visit: Payer: Self-pay

## 2015-04-21 DIAGNOSIS — R0789 Other chest pain: Secondary | ICD-10-CM | POA: Diagnosis not present

## 2015-04-21 DIAGNOSIS — Z950 Presence of cardiac pacemaker: Secondary | ICD-10-CM | POA: Diagnosis not present

## 2015-04-21 DIAGNOSIS — I481 Persistent atrial fibrillation: Secondary | ICD-10-CM | POA: Diagnosis not present

## 2015-04-21 DIAGNOSIS — R079 Chest pain, unspecified: Secondary | ICD-10-CM | POA: Diagnosis not present

## 2015-04-21 DIAGNOSIS — E785 Hyperlipidemia, unspecified: Secondary | ICD-10-CM | POA: Diagnosis not present

## 2015-04-21 DIAGNOSIS — I10 Essential (primary) hypertension: Secondary | ICD-10-CM | POA: Diagnosis not present

## 2015-04-21 DIAGNOSIS — I119 Hypertensive heart disease without heart failure: Secondary | ICD-10-CM | POA: Insufficient documentation

## 2015-04-21 DIAGNOSIS — I209 Angina pectoris, unspecified: Secondary | ICD-10-CM | POA: Diagnosis not present

## 2015-04-21 DIAGNOSIS — I2510 Atherosclerotic heart disease of native coronary artery without angina pectoris: Secondary | ICD-10-CM | POA: Insufficient documentation

## 2015-04-21 LAB — CBC
HCT: 41.5 % (ref 36.0–46.0)
Hemoglobin: 13.7 g/dL (ref 12.0–15.0)
MCH: 32.9 pg (ref 26.0–34.0)
MCHC: 33 g/dL (ref 30.0–36.0)
MCV: 99.5 fL (ref 78.0–100.0)
PLATELETS: 161 10*3/uL (ref 150–400)
RBC: 4.17 MIL/uL (ref 3.87–5.11)
RDW: 12.7 % (ref 11.5–15.5)
WBC: 6.9 10*3/uL (ref 4.0–10.5)

## 2015-04-21 LAB — PROTIME-INR
INR: 1.29 (ref 0.00–1.49)
PROTHROMBIN TIME: 16.2 s — AB (ref 11.6–15.2)

## 2015-04-21 LAB — APTT: APTT: 133 s — AB (ref 24–37)

## 2015-04-21 SURGERY — LEFT HEART CATH AND CORONARY ANGIOGRAPHY
Anesthesia: LOCAL

## 2015-04-21 MED ORDER — ASPIRIN 325 MG PO TBEC
325.0000 mg | DELAYED_RELEASE_TABLET | Freq: Once | ORAL | Status: DC
Start: 2015-04-21 — End: 2015-04-26

## 2015-04-21 MED ORDER — RANOLAZINE ER 500 MG PO TB12
1000.0000 mg | ORAL_TABLET | Freq: Two times a day (BID) | ORAL | Status: DC
  Administered 2015-04-21: 1000 mg via ORAL
  Filled 2015-04-21: qty 2

## 2015-04-21 MED ORDER — RANOLAZINE ER 500 MG PO TB12: 1000.0000 mg | ORAL_TABLET | Freq: Two times a day (BID) | ORAL | Status: DC

## 2015-04-21 NOTE — Care Management Obs Status (Signed)
[place] NOTIFICATION   Patient Details  Name: Chanah Ofallon MRN: [id number] Date of Birth: 11/28/1925   Medicare Observation Status Notification Given:  Yes. Explained Observation Status,  given copy at bedside. No voiced questions.     CrutchfieldAntony Haste, RN 04/21/2015, 11:58 AM

## 2015-04-21 NOTE — Progress Notes (Signed)
Orders received for pt discharge.  Discharge summary printed and reviewed with pt.  Explained medication regimen, and pt had no further questions at this time.  IV removed and site remains clean, dry, intact.  Telemetry removed.  Pt in stable condition and awaiting transport. 

## 2015-04-21 NOTE — Progress Notes (Signed)
Patient Name: [name] Date of Encounter: 04/21/2015   SUBJECTIVE  Feeling well. No chest pain, sob or palpitations. Will cancel cath today as she had last dose of Pradaxa yesterday. Recommended holding for 3 days. Will do cath as outpatient Monday.   CURRENT MEDS . aspirin EC  325 mg Oral Once  . dofetilide  250 mcg Oral BID  . magnesium oxide  400 mg Oral QPC lunch  . metoprolol succinate  50 mg Oral BID  . potassium chloride SA  20 mEq Oral Daily  . ranolazine  500 mg Oral BID  . regadenoson  0.4 mg Intravenous Once  . rosuvastatin  5 mg Oral QPM  . sodium chloride  3 mL Intravenous Q12H  . sodium chloride  3 mL Intravenous Q12H    OBJECTIVE  Filed Vitals:   04/20/15 1131 04/20/15 2040 04/21/15 0036 04/21/15 0442  BP: 160/80 127/71 128/58 138/80  Pulse: 73 70 70 70  Temp: 97.3 F (36.3 C) 97.9 F (36.6 C) 97.5 F (36.4 C) 98.3 F (36.8 C)  TempSrc:  Oral Oral Oral  Resp:  18 18 18   Height:      Weight:    139 lb 11.2 oz (63.368 kg)  SpO2: 99% 99% 97% 98%    Intake/Output Summary (Last 24 hours) at 04/21/15 0818 Last data filed at 04/21/15 0800  Gross per 24 hour  Intake 638.55 ml  Output   2025 ml  Net -1386.45 ml   Filed Weights   04/19/15 0856 04/20/15 0503 04/21/15 0442  Weight: 140 lb (63.504 kg) 140 lb (63.504 kg) 139 lb 11.2 oz (63.368 kg)    PHYSICAL EXAM  General: Pleasant, NAD. Neuro: Alert and oriented X 3. Moves all extremities spontaneously. Psych: Normal affect. HEENT:  Normal  Neck: Supple without bruits or JVD. Lungs:  Resp regular and unlabored, CTA. Heart: RRR no s3, s4, or murmurs. Abdomen: Soft, non-tender, non-distended, BS + x 4.  Extremities: No clubbing, cyanosis or edema. DP/PT/Radials 2+ and equal bilaterally.  Accessory Clinical Findings  CBC  Recent Labs  04/19/15 1000 04/20/15 0053 04/21/15 0705  WBC 7.9 6.9 6.9  NEUTROABS 5.5  --   --   HGB 15.9* 13.4 13.7  HCT 46.9* 40.4 41.5  MCV 98.5 99.0 99.5  PLT  169 153 [id number]   Basic Metabolic Panel  Recent Labs  04/19/15 1000 04/20/15 0053  NA 139 137  K 4.5 4.3  CL 108 106  CO2 24 25  GLUCOSE 103* 92  BUN 16 21*  CREATININE 0.98 1.13*  CALCIUM 9.1 8.4*   Cardiac Enzymes  Recent Labs  04/19/15 1230 04/19/15 1917 04/20/15 0053  TROPONINI <0.03 <0.03 <0.03    TELE  Paced rhythm  Radiology/Studies  Nm Myocar Multi W/spect W/wall Motion / Ef  04/20/2015  CLINICAL DATA:  Chest pain.  Shortness of breath EXAM: MYOCARDIAL IMAGING WITH SPECT (REST AND PHARMACOLOGIC-STRESS) GATED LEFT VENTRICULAR WALL MOTION STUDY LEFT VENTRICULAR EJECTION FRACTION TECHNIQUE: Standard myocardial SPECT imaging was performed after resting intravenous injection of 10 mCi Tc-54m sestamibi. Subsequently, intravenous infusion of Lexiscan was performed under the supervision of the Cardiology staff. At peak effect of the drug, 30 mCi Tc-13m sestamibi was injected intravenously and standard myocardial SPECT imaging was performed. Quantitative gated imaging was also performed to evaluate left ventricular wall motion, and estimate left ventricular ejection fraction. COMPARISON:  None. FINDINGS: Perfusion: Small to moderate-size anteroapical perfusion defect on stress images compared to rest images. Large inferoseptal defect on  stress images extends from the mid heart to the base. Wall Motion: Normal left ventricular wall motion. No left ventricular dilation. Left Ventricular Ejection Fraction: 79 % End diastolic volume 59 ml End systolic volume 12 ml IMPRESSION: 1. Suspected inducible is a ischemia in the inferoseptal wall extending from the mid heart to the base. Small to moderate-sized enter apical perfusion defect on stress images also suspicious for inducible ischemia. 2. Normal left ventricular wall motion. 3. Left ventricular ejection fraction 79% 4. High-risk stress test findings*. *2012 Appropriate Use Criteria for Coronary Revascularization Focused Update: J Am Coll  Cardiol. [id number]. http://content.[email or web address].aspx?articleid=1201161 Electronically Signed   By: Van Clines M.D.   On: 04/20/2015 12:53   Dg Chest Portable 1 View  04/19/2015  CLINICAL DATA:  Chest pain beginning this morning. History of atrial fibrillation. EXAM: PORTABLE CHEST 1 VIEW COMPARISON:  09/14/2014 FINDINGS: Left pacer remains in place, unchanged. Heart and mediastinal contours are within normal limits. No focal opacities or effusions. No acute bony abnormality. IMPRESSION: No active cardiopulmonary disease. Electronically Signed   By: Rolm Baptise M.D.   On: 04/19/2015 09:30    ASSESSMENT AND PLAN  81 yo female w/ hx PAF, BSci PPM, COPD, HTN, HL, mild AS, mild MR, mild-mod TR by echo 06/2014 and renal cell CA. Admitted 12/07 w/ chest pain. Vol overload by exam, given IV Lasix.   1. Paroxysmal A. Fib. - Previously symptomatic, however asymptomatic since Renexa has been added.  - Chadsvascs score of 3-4. Continue ASA, Ranexa and Tikosyn. Pradaxa on hold.  - Prior PVI-[facility] complicated by tamponade - Device revealed increased atrial fibrillation burden (57%) but no afib at the time of her chest pain   2. Chest pain - Unable to take nitroglycerin due to issues of hypertension. Currently chest pain-free.  - Myoview was high risk. Will cancel cath today as she had last dose of Pradaxa yesterday. Hold Pradaxa until cath Monday 04/24/15 at 9am. She needs to come at [facility] by 7:00 am.  - Continue Toprol XL 50mg , ASA and Ranexa 500mg  BID.  Risks and benefits of cardiac catheterization have been discussed with the patient. The patient understands that risks included but are not limited to stroke (1 in 1000), death (1 in 3), kidney failure [usually temporary] (1 in 500), bleeding (1 in 200), allergic reaction [possibly serious] (1 in 200). The patient understands and agrees to proceed and understands new plan.   3. Pacemaker-[employer]  (dual-chamber) - Last device check 01/30/15 showed normal function, 49% AF/AF +tikosyn and pradaxa during office visit with Dr. Caryl Comes. - now with 57% afib burden.   4. Very mild aortic valve stenosis  - Last echocardiogram February 2016 showed normal LV function, mild aortic stenosis, mild aortic insufficiency, mild mitral regurgitation and moderate to severe left atrial enlargement. Mild to moderate tricuspid regurgitation. - Patient appears euvolemic.  5 Intermittent dyspnea.  - Could be her shortness of breath due to worsening aortic valve disease, atrial fibrillation or progression of heart disease. - BNP 439. Given IV lasix once. Appears euvolemic.  6. Hypertension - She also has a history of orthostatic hypotension - Blood pressure is stable.   Jarrett Soho PA-C Pager [phone]

## 2015-04-21 NOTE — Progress Notes (Signed)
Prior to [employer], EKG performed with the following results: PR 384 ms QRS 142 ms QT/QTc  496/525  QTc < 550.  Will [employer] as ordered.  Post EKG will be performed 2 hours later.

## 2015-04-21 NOTE — Care Management Note (Signed)
Case Management Note  Patient Details  Name: Darcee G[place]burg MRN: [id number] Date of Birth: 05/31/1925  Subjective/Objective:   81 y.o. F to be discharged today Home with resumption of HHRN through [facility]. Pt and Spouse have been in phone contact with representative who will assume resumption of care.                  Action/Plan: Anticipate discharge home today. No further CM needs but will be available should additional discharge needs arise.   Expected Discharge Date:                  Expected Discharge Plan:  [place]  In-House Referral:     Discharge planning Services     Post Acute Care Choice:    Choice offered to:  Patient, Spouse, Adult Children  DME Arranged:    DME Agency:  [place]  HH Arranged:  RN [facility] Agency:  [place]  Status of Service:  Completed, signed off  Medicare Important Message Given:    Date Medicare IM Given:    Medicare IM give by:    Date Additional Medicare IM Given:    Additional Medicare Important Message give by:     If discussed at [place] of Stay Meetings, dates discussed:    Additional Comments:  Delrae Sawyers, RN 04/21/2015, 12:01 PM

## 2015-04-21 NOTE — Progress Notes (Signed)
Post Tikosyn EKG performed with the following results:  PR 322 ms QRS 146 ms QT/QTc  466/524  QTc < 550.

## 2015-04-23 NOTE — Discharge Summary (Signed)
Physician Discharge Summary  [name] [id number] DOB: [birth year] DOA: 04/19/2015  PCP: Merrilee Seashore, MD  Admit date: 04/19/2015 Discharge date: 04/21/15  Recommendations for Outpatient Follow-up:  1. Return for cardiac catheterization 12/12 2. Decrease aspirin to 81 mg once pradaxa resumed   Discharge Diagnoses:  Principal Problem:   Chest pain Active Problems:   Persistent atrial fibrillation   HTN (hypertension)   Pacemaker   Hyperlipidemia   Discharge Condition: stable  Diet recommendation: heart healthy  Filed Weights   04/19/15 0856 04/20/15 0503 04/21/15 0442  Weight: 63.504 kg (140 lb) 63.504 kg (140 lb) 63.368 kg (139 lb 11.2 oz)    History of present illness:  81 y.o. female  Chest pain. Started acutely this morning while patient was sitting down reading the newspaper. Substernal to left chest that radiated to the epigastric region. Described as more of a pressure-like feeling. Lasted approximately 15-20 minutes. Pain is not exacerbated by movement, deep respirations, or exertion. Associated with shortness of breath but denies nausea, syncope, palpitations, diaphoresis. Patient states that she typically does nothing during these episodes and that it eventually goes away. Patient states that she's had 3 other episodes like this over the last several months but this was by far the worst. Patient states that the pain stopped her in her tracks. Patient states that she does not take nitroglycerin due to side effect of severe hypotension.  Hospital Course:  Admitted to telemetry MI ruled out. Cardiology consulted. cardiolyte high risk. pradaxa held and patient will have cardiac catheterization after washout on Monday.  Has had no further chest pain. [place] for discharge on ASA 325 mg per cardiology. Decrease ASA to 81 mg when pradaxa resumed  Procedures:  none  Consultations:  cardiology  Discharge Exam: Filed Vitals:   04/21/15 0442 04/21/15 1202  BP: 138/80  143/72  Pulse: 70 71  Temp: 98.3 F (36.8 C) 97.6 F (36.4 C)  Resp: 18 18    General: a and o Cardiovascular: RRR Respiratory: CTA  Discharge Instructions   Discharge Instructions    Diet - low sodium heart healthy    Complete by:  As directed      Discharge instructions    Complete by:  As directed   Stop pradaxa     Increase activity slowly    Complete by:  As directed           Discharge Medication List as of 04/21/2015 12:59 PM    START taking these medications   Details  aspirin EC 325 MG EC tablet Take 1 tablet (325 mg total) by mouth once., Starting 04/21/2015, Print      CONTINUE these medications which have NOT CHANGED   Details  acetaminophen (TYLENOL) 325 MG tablet Take 2 tablets (650 mg total) by mouth every 6 (six) hours as needed for mild pain or moderate pain., Starting 09/29/2014, Until Discontinued, OTC    Calcium Carbonate-Vitamin D (CALCIUM + D PO) Take 1 tablet by mouth 2 (two) times daily., Until Discontinued, Historical Med    dofetilide (TIKOSYN) 250 MCG capsule Take 250 mcg by mouth 2 (two) times daily. Takes at 8 AM and 8 PM. Do not vary more than 1 hour., Until Discontinued, Historical Med    fluorometholone (FML) 0.1 % ophthalmic suspension Place 1 drop into both eyes daily as needed (dry eyes). , Until Discontinued, Historical Med    furosemide (LASIX) 40 MG tablet Take 20 mg by mouth daily., Until Discontinued, Historical Med    magnesium oxide (  MAG-OX) 400 MG tablet Take 1 tablet (400 mg total) by mouth daily., Starting 01/04/2013, Until Discontinued, Normal    metoprolol succinate (TOPROL-XL) 50 MG 24 hr tablet Take 1 tablet (50 mg total) by mouth 2 (two) times daily., Starting 07/11/2014, Until Discontinued, Normal    Multiple Vitamins-Minerals (MULTIVITAMINS THER. W/MINERALS) TABS Take 1 tablet by mouth every morning.  , Until Discontinued, Historical Med    Omega-3 Fatty Acids (FISH OIL) 1000 MG CAPS Take 1,000 mg by mouth 2 (two) times  daily. , Until Discontinued, Historical Med    potassium chloride SA (K-DUR,KLOR-CON) 20 MEQ tablet Take 20 mEq by mouth daily. , Starting 11/06/2014, Until Discontinued, Historical Med    ranitidine (ZANTAC) 150 MG capsule Take 150 mg by mouth every morning. , Starting 09/09/2012, Until Discontinued, Historical Med    ranolazine (RANEXA) 500 MG 12 hr tablet Take 1 tablet (500 mg total) by mouth 2 (two) times daily., Starting 02/27/2015, Until Discontinued, Normal    rosuvastatin (CRESTOR) 5 MG tablet Take 1 tablet (5 mg total) by mouth every evening., Starting 06/10/2013, Until Discontinued, Sample    NON FORMULARY Allergy vaccines 1:10 weekly GH, Until Discontinued, Historical Med      STOP taking these medications     PRADAXA 75 MG CAPS capsule        Allergies  Allergen Reactions  . Tramadol Other (See Comments)    Medication made her feel "off and talk to herself"  . Protonix [Pantoprazole Sodium] Other (See Comments)    Abdominal pain   Follow-up Information    Follow up with [facility], [place].   Specialty:  Internal Medicine   Contact information:   [address] [place] [address] [phone]        The results of significant diagnostics from this hospitalization (including imaging, microbiology, ancillary and laboratory) are listed below for reference.    Significant Diagnostic Studies: Nm Myocar Multi W/spect W/wall Motion / Ef  04/20/2015  CLINICAL DATA:  Chest pain.  Shortness of breath EXAM: MYOCARDIAL IMAGING WITH SPECT (REST AND PHARMACOLOGIC-STRESS) GATED LEFT VENTRICULAR WALL MOTION STUDY LEFT VENTRICULAR EJECTION FRACTION TECHNIQUE: Standard myocardial SPECT imaging was performed after resting intravenous injection of 10 mCi Tc-13m sestamibi. Subsequently, intravenous infusion of Lexiscan was performed under the supervision of the Cardiology staff. At peak effect of the drug, 30 mCi Tc-24m sestamibi was injected intravenously and  standard myocardial SPECT imaging was performed. Quantitative gated imaging was also performed to evaluate left ventricular wall motion, and estimate left ventricular ejection fraction. COMPARISON:  None. FINDINGS: Perfusion: Small to moderate-size anteroapical perfusion defect on stress images compared to rest images. Large inferoseptal defect on stress images extends from the mid heart to the base. Wall Motion: Normal left ventricular wall motion. No left ventricular dilation. Left Ventricular Ejection Fraction: 79 % End diastolic volume 59 ml End systolic volume 12 ml IMPRESSION: 1. Suspected inducible is a ischemia in the inferoseptal wall extending from the mid heart to the base. Small to moderate-sized enter apical perfusion defect on stress images also suspicious for inducible ischemia. 2. Normal left ventricular wall motion. 3. Left ventricular ejection fraction 79% 4. High-risk stress test findings*. *2012 Appropriate Use Criteria for Coronary Revascularization Focused Update: J Am Coll Cardiol. [id number]. http://content.airportbarriers.com.aspx?articleid=1201161 Electronically Signed   By: Van Clines M.D.   On: 04/20/2015 12:53   Dg Chest Portable 1 View  04/19/2015  CLINICAL DATA:  Chest pain beginning this morning. History of atrial fibrillation. EXAM: PORTABLE CHEST 1  VIEW COMPARISON:  09/14/2014 FINDINGS: Left pacer remains in place, unchanged. Heart and mediastinal contours are within normal limits. No focal opacities or effusions. No acute bony abnormality. IMPRESSION: No active cardiopulmonary disease. Electronically Signed   By: Rolm Baptise M.D.   On: 04/19/2015 09:30    Microbiology: No results found for this or any previous visit (from the past 240 hour(s)).   Labs: Basic Metabolic Panel:  Recent Labs Lab 04/19/15 1000 04/20/15 0053  NA 139 137  K 4.5 4.3  CL 108 106  CO2 24 25  GLUCOSE 103* 92  BUN 16 21*  CREATININE 0.98 1.13*  CALCIUM 9.1 8.4*    Liver Function Tests: No results for input(s): AST, ALT, ALKPHOS, BILITOT, PROT, ALBUMIN in the last 168 hours. No results for input(s): LIPASE, AMYLASE in the last 168 hours. No results for input(s): AMMONIA in the last 168 hours. CBC:  Recent Labs Lab 04/19/15 1000 04/20/15 0053 04/21/15 0705  WBC 7.9 6.9 6.9  NEUTROABS 5.5  --   --   HGB 15.9* 13.4 13.7  HCT 46.9* 40.4 41.5  MCV 98.5 99.0 99.5  PLT 169 153 161   Cardiac Enzymes:  Recent Labs Lab 04/19/15 1230 04/19/15 1917 04/20/15 0053  TROPONINI <0.03 <0.03 <0.03   BNP: BNP (last 3 results)  Recent Labs  04/19/15 1000  BNP 439.6*    ProBNP (last 3 results) No results for input(s): PROBNP in the last 8760 hours.  CBG: No results for input(s): GLUCAP in the last 168 hours.     SignedDelfina Redwood  [facility] 04/23/2015, 5:04 PM

## 2015-04-24 ENCOUNTER — Encounter (HOSPITAL_COMMUNITY)
Admission: RE | Disposition: A | Discharge status: Home / Self Care | Payer: Medicare Other | Source: Ambulatory Visit | Attending: Interventional Cardiology

## 2015-04-24 ENCOUNTER — Ambulatory Visit (HOSPITAL_COMMUNITY)
Admission: RE | Admit: 2015-04-24 | Discharge: 2015-04-24 | Disposition: A | Discharge status: Home / Self Care | Payer: Medicare Other | Source: Ambulatory Visit | Attending: Interventional Cardiology | Admitting: Interventional Cardiology

## 2015-04-24 DIAGNOSIS — E7800 Pure hypercholesterolemia, unspecified: Secondary | ICD-10-CM | POA: Diagnosis present

## 2015-04-24 DIAGNOSIS — I480 Paroxysmal atrial fibrillation: Secondary | ICD-10-CM | POA: Diagnosis not present

## 2015-04-24 DIAGNOSIS — I2510 Atherosclerotic heart disease of native coronary artery without angina pectoris: Secondary | ICD-10-CM | POA: Diagnosis not present

## 2015-04-24 DIAGNOSIS — I10 Essential (primary) hypertension: Secondary | ICD-10-CM | POA: Diagnosis not present

## 2015-04-24 DIAGNOSIS — Z7982 Long term (current) use of aspirin: Secondary | ICD-10-CM | POA: Diagnosis not present

## 2015-04-24 DIAGNOSIS — I714 Abdominal aortic aneurysm, without rupture, unspecified: Secondary | ICD-10-CM | POA: Diagnosis present

## 2015-04-24 DIAGNOSIS — R079 Chest pain, unspecified: Secondary | ICD-10-CM | POA: Insufficient documentation

## 2015-04-24 DIAGNOSIS — I951 Orthostatic hypotension: Secondary | ICD-10-CM | POA: Diagnosis not present

## 2015-04-24 DIAGNOSIS — I083 Combined rheumatic disorders of mitral, aortic and tricuspid valves: Secondary | ICD-10-CM | POA: Insufficient documentation

## 2015-04-24 DIAGNOSIS — J449 Chronic obstructive pulmonary disease, unspecified: Secondary | ICD-10-CM | POA: Insufficient documentation

## 2015-04-24 DIAGNOSIS — Z950 Presence of cardiac pacemaker: Secondary | ICD-10-CM | POA: Diagnosis not present

## 2015-04-24 DIAGNOSIS — R9439 Abnormal result of other cardiovascular function study: Secondary | ICD-10-CM | POA: Diagnosis not present

## 2015-04-24 HISTORY — PX: CARDIAC CATHETERIZATION: SHX172

## 2015-04-24 SURGERY — LEFT HEART CATH AND CORONARY ANGIOGRAPHY
Anesthesia: LOCAL

## 2015-04-24 MED ORDER — SODIUM CHLORIDE 0.9 % WEIGHT BASED INFUSION
3.0000 mL/kg/h | INTRAVENOUS | Status: AC
  Administered 2015-04-24: 3 mL/kg/h via INTRAVENOUS

## 2015-04-24 MED ORDER — SODIUM CHLORIDE 0.9 % IJ SOLN: 3.0000 mL | INTRAMUSCULAR | Status: DC | PRN

## 2015-04-24 MED ORDER — ACETAMINOPHEN 325 MG PO TABS: 650.0000 mg | ORAL_TABLET | ORAL | Status: DC | PRN

## 2015-04-24 MED ORDER — SODIUM CHLORIDE 0.9 % IV SOLN: 250.0000 mL | INTRAVENOUS | Status: DC | PRN

## 2015-04-24 MED ORDER — ASPIRIN 81 MG PO CHEW: 81.0000 mg | CHEWABLE_TABLET | ORAL | Status: DC

## 2015-04-24 MED ORDER — MIDAZOLAM HCL 2 MG/2ML IJ SOLN
INTRAMUSCULAR | Status: DC | PRN
  Administered 2015-04-24 (×2): 1 mg via INTRAVENOUS

## 2015-04-24 MED ORDER — LIDOCAINE HCL (PF) 1 % IJ SOLN
INTRAMUSCULAR | Status: AC
  Filled 2015-04-24: qty 30

## 2015-04-24 MED ORDER — NITROGLYCERIN 1 MG/10 ML FOR IR/CATH LAB
INTRA_ARTERIAL | Status: AC
  Filled 2015-04-24: qty 10

## 2015-04-24 MED ORDER — FENTANYL CITRATE (PF) 100 MCG/2ML IJ SOLN
INTRAMUSCULAR | Status: AC
  Filled 2015-04-24: qty 2

## 2015-04-24 MED ORDER — SODIUM CHLORIDE 0.9 % WEIGHT BASED INFUSION: 1.0000 mL/kg/h | INTRAVENOUS | Status: DC

## 2015-04-24 MED ORDER — HEPARIN (PORCINE) IN NACL 2-0.9 UNIT/ML-% IJ SOLN
INTRAMUSCULAR | Status: AC
  Filled 2015-04-24: qty 1000

## 2015-04-24 MED ORDER — ONDANSETRON HCL 4 MG/2ML IJ SOLN: 4.0000 mg | Freq: Four times a day (QID) | INTRAMUSCULAR | Status: DC | PRN

## 2015-04-24 MED ORDER — SODIUM CHLORIDE 0.9 % IJ SOLN: 3.0000 mL | Freq: Two times a day (BID) | INTRAMUSCULAR | Status: DC

## 2015-04-24 MED ORDER — LIDOCAINE HCL (PF) 1 % IJ SOLN
INTRAMUSCULAR | Status: DC | PRN
  Administered 2015-04-24: 10:00:00

## 2015-04-24 MED ORDER — MIDAZOLAM HCL 2 MG/2ML IJ SOLN
INTRAMUSCULAR | Status: AC
  Filled 2015-04-24: qty 2

## 2015-04-24 MED ORDER — FENTANYL CITRATE (PF) 100 MCG/2ML IJ SOLN
INTRAMUSCULAR | Status: DC | PRN
  Administered 2015-04-24: 50 ug via INTRAVENOUS

## 2015-04-24 MED ORDER — VERAPAMIL HCL 2.5 MG/ML IV SOLN
INTRAVENOUS | Status: AC
  Filled 2015-04-24: qty 2

## 2015-04-24 MED ORDER — SODIUM CHLORIDE 0.9 % WEIGHT BASED INFUSION: 1.0000 mL/kg/h | INTRAVENOUS | Status: AC

## 2015-04-24 SURGICAL SUPPLY — 12 items
CATH INFINITI 5 FR MPA2 (CATHETERS) ×2 IMPLANT
CATH INFINITI 5FR JL4 (CATHETERS) ×2 IMPLANT
CATH INFINITI JR4 5F (CATHETERS) ×2 IMPLANT
DEVICE RAD COMP TR BAND LRG (VASCULAR PRODUCTS) ×2 IMPLANT
GLIDESHEATH SLEND A-KIT 6F 22G (SHEATH) ×2 IMPLANT
KIT HEART LEFT (KITS) ×2 IMPLANT
PACK CARDIAC CATHETERIZATION (CUSTOM PROCEDURE TRAY) ×2 IMPLANT
SHEATH PINNACLE 5F 10CM (SHEATH) IMPLANT
TRANSDUCER W/STOPCOCK (MISCELLANEOUS) ×2 IMPLANT
TUBING CIL FLEX 10 FLL-RA (TUBING) ×2 IMPLANT
WIRE EMERALD 3MM-J .035X150CM (WIRE) IMPLANT
WIRE HI TORQ VERSACORE-J 145CM (WIRE) ×2 IMPLANT

## 2015-04-24 NOTE — Discharge Instructions (Signed)
Angiogram, Care After °Refer to this sheet in the next few weeks. These instructions provide you with information about caring for yourself after your procedure. Your health care provider may also give you more specific instructions. Your treatment has been planned according to current medical practices, but problems sometimes occur. Call your health care provider if you have any problems or questions after your procedure. °WHAT TO EXPECT AFTER THE PROCEDURE °After your procedure, it is typical to have the following: °· Bruising at the catheter insertion site that usually fades within 1-2 weeks. °· Blood collecting in the tissue (hematoma) that may be painful to the touch. It should usually decrease in size and tenderness within 1-2 weeks. °HOME CARE INSTRUCTIONS °· Take medicines only as directed by your health care provider. °· You may shower 24-48 hours after the procedure or as directed by your health care provider. Remove the bandage (dressing) and gently wash the site with plain soap and water. Pat the area dry with a clean towel. Do not rub the site, because this may cause bleeding. °· Do not take baths, swim, or use a hot tub until your health care provider approves. °· Check your insertion site every day for redness, swelling, or drainage. °· Do not apply powder or lotion to the site. °· Do not lift over 10 lb (4.5 kg) for 5 days after your procedure or as directed by your health care provider. °· Ask your health care provider when it is okay to: °¨ Return to work or school. °¨ Resume usual physical activities or sports. °¨ Resume sexual activity. °· Do not drive home if you are discharged the same day as the procedure. Have someone else drive you. °· You may drive 24 hours after the procedure unless otherwise instructed by your health care provider. °· Do not operate machinery or power tools for 24 hours after the procedure or as directed by your health care provider. °· If your procedure was done as an  outpatient procedure, which means that you went home the same day as your procedure, a responsible adult should be with you for the first 24 hours after you arrive home. °· Keep all follow-up visits as directed by your health care provider. This is important. °SEEK MEDICAL CARE IF: °· You have a fever. °· You have chills. °· You have increased bleeding from the catheter insertion site. Hold pressure on the site. °SEEK IMMEDIATE MEDICAL CARE IF: °· You have unusual pain at the catheter insertion site. °· You have redness, warmth, or swelling at the catheter insertion site. °· You have drainage (other than a small amount of blood on the dressing) from the catheter insertion site. °· The catheter insertion site is bleeding, and the bleeding does not stop after 30 minutes of holding steady pressure on the site. °· The area near or just beyond the catheter insertion site becomes pale, cool, tingly, or numb. °  °This information is not intended to replace advice given to you by your health care provider. Make sure you discuss any questions you have with your health care provider. °  °Document Released: 11/15/2004 Document Revised: 05/20/2014 Document Reviewed: 09/30/2012 °Elsevier Interactive Patient Education ©2016 [employer] ° °

## 2015-04-24 NOTE — Progress Notes (Addendum)
Site area: rfa Site Prior to Removal:  Level 0 Pressure Applied For:20 min Manual:  yes  Patient Status During Pull:stable   Post Pull Site:  Level 0 Post Pull Instructions Given: yes  Post Pull Pulses Present: palpable Dressing Applied:  clear Bedrest begins @ 1020 till 1420 Comments:

## 2015-04-24 NOTE — Interval H&P Note (Signed)
Cath Lab Visit (complete for each Cath Lab visit)  Clinical Evaluation Leading to the Procedure:   ACS: No.  Non-ACS:    Anginal Classification: CCS III  Anti-ischemic medical therapy: Maximal Therapy (2 or more classes of medications)  Non-Invasive Test Results: High-risk stress test findings: cardiac mortality >3%/year  Prior CABG: No previous CABG      History and Physical Interval Note:  04/24/2015 8:50 AM  [name]  has presented today for surgery, with the diagnosis of chest pain  The various methods of treatment have been discussed with the patient and family. After consideration of risks, benefits and other options for treatment, the patient has consented to  Procedure(s): Left Heart Cath and Coronary Angiography (N/A) as a surgical intervention .  The patient's history has been reviewed, patient examined, no change in status, stable for surgery.  I have reviewed the patient's chart and labs.  Questions were answered to the patient's satisfaction.     Sinclair Grooms

## 2015-04-24 NOTE — H&P (View-Only) (Signed)
Patient Name: [name] Date of Encounter: 04/21/2015   SUBJECTIVE  Feeling well. No chest pain, sob or palpitations. Will cancel cath today as she had last dose of Pradaxa yesterday. Recommended holding for 3 days. Will do cath as outpatient Monday.   CURRENT MEDS . aspirin EC  325 mg Oral Once  . dofetilide  250 mcg Oral BID  . magnesium oxide  400 mg Oral QPC lunch  . metoprolol succinate  50 mg Oral BID  . potassium chloride SA  20 mEq Oral Daily  . ranolazine  500 mg Oral BID  . regadenoson  0.4 mg Intravenous Once  . rosuvastatin  5 mg Oral QPM  . sodium chloride  3 mL Intravenous Q12H  . sodium chloride  3 mL Intravenous Q12H    OBJECTIVE  Filed Vitals:   04/20/15 1131 04/20/15 2040 04/21/15 0036 04/21/15 0442  BP: 160/80 127/71 128/58 138/80  Pulse: 73 70 70 70  Temp: 97.3 F (36.3 C) 97.9 F (36.6 C) 97.5 F (36.4 C) 98.3 F (36.8 C)  TempSrc:  Oral Oral Oral  Resp:  18 18 18   Height:      Weight:    139 lb 11.2 oz (63.368 kg)  SpO2: 99% 99% 97% 98%    Intake/Output Summary (Last 24 hours) at 04/21/15 0818 Last data filed at 04/21/15 0800  Gross per 24 hour  Intake 638.55 ml  Output   2025 ml  Net -1386.45 ml   Filed Weights   04/19/15 0856 04/20/15 0503 04/21/15 0442  Weight: 140 lb (63.504 kg) 140 lb (63.504 kg) 139 lb 11.2 oz (63.368 kg)    PHYSICAL EXAM  General: Pleasant, NAD. Neuro: Alert and oriented X 3. Moves all extremities spontaneously. Psych: Normal affect. HEENT:  Normal  Neck: Supple without bruits or JVD. Lungs:  Resp regular and unlabored, CTA. Heart: RRR no s3, s4, or murmurs. Abdomen: Soft, non-tender, non-distended, BS + x 4.  Extremities: No clubbing, cyanosis or edema. DP/PT/Radials 2+ and equal bilaterally.  Accessory Clinical Findings  CBC  Recent Labs  04/19/15 1000 04/20/15 0053 04/21/15 0705  WBC 7.9 6.9 6.9  NEUTROABS 5.5  --   --   HGB 15.9* 13.4 13.7  HCT 46.9* 40.4 41.5  MCV 98.5 99.0 99.5  PLT  169 153 [id number]   Basic Metabolic Panel  Recent Labs  04/19/15 1000 04/20/15 0053  NA 139 137  K 4.5 4.3  CL 108 106  CO2 24 25  GLUCOSE 103* 92  BUN 16 21*  CREATININE 0.98 1.13*  CALCIUM 9.1 8.4*   Cardiac Enzymes  Recent Labs  04/19/15 1230 04/19/15 1917 04/20/15 0053  TROPONINI <0.03 <0.03 <0.03    TELE  Paced rhythm  Radiology/Studies  Nm Myocar Multi W/spect W/wall Motion / Ef  04/20/2015  CLINICAL DATA:  Chest pain.  Shortness of breath EXAM: MYOCARDIAL IMAGING WITH SPECT (REST AND PHARMACOLOGIC-STRESS) GATED LEFT VENTRICULAR WALL MOTION STUDY LEFT VENTRICULAR EJECTION FRACTION TECHNIQUE: Standard myocardial SPECT imaging was performed after resting intravenous injection of 10 mCi Tc-56m sestamibi. Subsequently, intravenous infusion of Lexiscan was performed under the supervision of the Cardiology staff. At peak effect of the drug, 30 mCi Tc-42m sestamibi was injected intravenously and standard myocardial SPECT imaging was performed. Quantitative gated imaging was also performed to evaluate left ventricular wall motion, and estimate left ventricular ejection fraction. COMPARISON:  None. FINDINGS: Perfusion: Small to moderate-size anteroapical perfusion defect on stress images compared to rest images. Large inferoseptal defect on  stress images extends from the mid heart to the base. Wall Motion: Normal left ventricular wall motion. No left ventricular dilation. Left Ventricular Ejection Fraction: 79 % End diastolic volume 59 ml End systolic volume 12 ml IMPRESSION: 1. Suspected inducible is a ischemia in the inferoseptal wall extending from the mid heart to the base. Small to moderate-sized enter apical perfusion defect on stress images also suspicious for inducible ischemia. 2. Normal left ventricular wall motion. 3. Left ventricular ejection fraction 79% 4. High-risk stress test findings*. *2012 Appropriate Use Criteria for Coronary Revascularization Focused Update: J Am Coll  Cardiol. [id number]. http://content.airportbarriers.com.aspx?articleid=[email or web address] Electronically Signed   By: Van Clines M.D.   On: 04/20/2015 12:53   Dg Chest Portable 1 View  04/19/2015  CLINICAL DATA:  Chest pain beginning this morning. History of atrial fibrillation. EXAM: PORTABLE CHEST 1 VIEW COMPARISON:  09/14/2014 FINDINGS: Left pacer remains in place, unchanged. Heart and mediastinal contours are within normal limits. No focal opacities or effusions. No acute bony abnormality. IMPRESSION: No active cardiopulmonary disease. Electronically Signed   By: Rolm Baptise M.D.   On: 04/19/2015 09:30    ASSESSMENT AND PLAN  81 yo female w/ hx PAF, BSci PPM, COPD, HTN, HL, mild AS, mild MR, mild-mod TR by echo 06/2014 and renal cell CA. Admitted 12/07 w/ chest pain. Vol overload by exam, given IV Lasix.   1. Paroxysmal A. Fib. - Previously symptomatic, however asymptomatic since Renexa has been added.  - Chadsvascs score of 3-4. Continue ASA, Ranexa and Tikosyn. Pradaxa on hold.  - Prior PVI-[facility] complicated by tamponade - Device revealed increased atrial fibrillation burden (57%) but no afib at the time of her chest pain   2. Chest pain - Unable to take nitroglycerin due to issues of hypertension. Currently chest pain-free.  - Myoview was high risk. Will cancel cath today as she had last dose of Pradaxa yesterday. Hold Pradaxa until cath Monday 04/24/15 at 9am. She needs to come at [facility] by 7:00 am.  - Continue Toprol XL 50mg , ASA and Ranexa 500mg  BID.  Risks and benefits of cardiac catheterization have been discussed with the patient. The patient understands that risks included but are not limited to stroke (1 in 1000), death (1 in 26), kidney failure [usually temporary] (1 in 500), bleeding (1 in 200), allergic reaction [possibly serious] (1 in 200). The patient understands and agrees to proceed and understands new plan.   3. Pacemaker-[employer]  (dual-chamber) - Last device check 01/30/15 showed normal function, 49% AF/AF +tikosyn and pradaxa during office visit with Dr. Caryl Comes. - now with 57% afib burden.   4. Very mild aortic valve stenosis  - Last echocardiogram February 2016 showed normal LV function, mild aortic stenosis, mild aortic insufficiency, mild mitral regurgitation and moderate to severe left atrial enlargement. Mild to moderate tricuspid regurgitation. - Patient appears euvolemic.  5 Intermittent dyspnea.  - Could be her shortness of breath due to worsening aortic valve disease, atrial fibrillation or progression of heart disease. - BNP 439. Given IV lasix once. Appears euvolemic.  6. Hypertension - She also has a history of orthostatic hypotension - Blood pressure is stable.   Jarrett Soho PA-C Pager [phone]

## 2015-04-25 ENCOUNTER — Encounter: Payer: Medicare Other | Admitting: Cardiovascular Disease

## 2015-04-25 ENCOUNTER — Encounter (HOSPITAL_COMMUNITY): Payer: Self-pay | Admitting: Interventional Cardiology

## 2015-04-25 MED FILL — Verapamil HCl IV Soln 2.5 MG/ML: INTRAVENOUS | Qty: 2 | Status: AC

## 2015-04-26 ENCOUNTER — Encounter: Payer: Self-pay | Admitting: Internal Medicine

## 2015-04-26 ENCOUNTER — Encounter: Payer: Medicare Other | Admitting: Cardiovascular Disease

## 2015-04-26 ENCOUNTER — Ambulatory Visit (INDEPENDENT_AMBULATORY_CARE_PROVIDER_SITE_OTHER): Payer: Medicare Other | Admitting: Internal Medicine

## 2015-04-26 VITALS — BP 118/66 | HR 70 | Ht 62.0 in | Wt 142.4 lb

## 2015-04-26 DIAGNOSIS — I701 Atherosclerosis of renal artery: Secondary | ICD-10-CM | POA: Diagnosis not present

## 2015-04-26 DIAGNOSIS — I481 Persistent atrial fibrillation: Secondary | ICD-10-CM | POA: Diagnosis not present

## 2015-04-26 DIAGNOSIS — I4819 Other persistent atrial fibrillation: Secondary | ICD-10-CM

## 2015-04-26 NOTE — Patient Instructions (Signed)
Occasional antihistamine is fine for sneeze and runny nose. If benadryl makes you too drowsy, try otc claritin.  You can see your primary physician now for routine breathing problems.  We can see you again if needed

## 2015-04-26 NOTE — Progress Notes (Signed)
Patient ID: [name], female    DOB: Dec 18, 1925, 81 y.o.   MRN: [id number]  HPI 12/28/10- 81 year old female former smoker followed for allergic rhinitis eustachian dysfunction complicated by history of atrial fibrillation/amiodarone. Last here June 29, 2010  Disliked the heat this summer but otherwise denies respiratory problems since last here.  Allergy vaccine- ok, getting shots here. Still uses ipratropium nasal spray for rhinorhea when going out, but doesn't bother with it if she is staying home. CXR 06/29/10- clear, NAD, with emphysema/ COPD change, No indication of amiodarone related changes. Got cortisone shot in knee for gout 4 days ago.  07/02/11-  81 year old female former smoker followed for allergic rhinitis, eustachian dysfunction complicated by history of atrial fibrillation/amiodarone. Stable on allergy vaccine. No change in her sense of shortness of breath with exertion which has been  present for years. Stopped amiodarone in October of 2012. Cardioversion was unsuccessful. Some coughing spells without wheezing. Denies anemia or chest pain. Paces herself. Asks refill for ipratropium nasal spray. Nasal stuffiness contributes to her sense of shortness of breath. PFT 12/15/2007-FEV1 1.91/114% without response to bronchodilator, FEV1/FVC 0.71, TLC 104%, DLCO 60%. CXR-  IMPRESSION: 05/29/11 Mild cardiomegaly without acute disease.  Original Report Authenticated By: Arvid Right. D'ALESSIO, M.D.   02/16/13- 81 year old female former smoker followed for allergic rhinitis, eustachian dysfunction complicated by history of atrial fibrillation/amiodarone FOLLOWS FOR: still on Allergy vaccine 1:10 GH and doing well; recently had virus-on abx BID from PCP. Has had flu vaccine. Primary physician treated bronchitis with antibiotic and gave nasal spray and cough syrup. She has not been needing ipratropium nasal spray.  02/16/14- 81 year old female former smoker followed for allergic  rhinitis, eustachian dysfunction , COPD mixed type complicated by history of atrial fibrillation/amiodarone FOLLOW UP: Allergies; DOE; SOB; Pt has Afib, she is going in and out of Afib this morning, she says this is normal for her, it is nothing new; when her heart starts beating real fast, she gets short of breath; no further complaints. On Pradaxa CXR 11/24/13 IMPRESSION:  No significant acute findings.  Electronically Signed  By: Skipper Cliche M.D.  On: 11/24/2013 17:03  04/26/2015-81 year old female former smoker followed for  allergic rhinitis, eustachian dysfunction, complicated by history of atrial fibrillation/amiodarone, HBP, CAD Here with son and a nurses aid FOLLOWS FOR: Pt states she has been doing well since last visit with allergies(no longer on allergy injections). Pt states Monday this week she started taking Benadryl due to ? allergies-seems to be helping. Increased rhinitis symptoms-allergic or viral. She think she can manage comfortably with an antihistamine. We discussed sedating versus nonsedating meds. Has had cardiac workup recently including calf showing nonobstructive CAD Now one week of sneeze and watery nose without fever or sore throat. Using Benadryl occasionally to help sleep.   Review of Systems- see HPI Constitutional:   No-   weight loss, night sweats, fevers, chills, fatigue, lassitude. HEENT:   No-  headaches, difficulty swallowing, tooth/dental problems, sore throat,       No-  sneezing, itching, ear ache, +nasal congestion, post nasal drip,  CV:  No-   chest pain, orthopnea, PND, swelling in lower extremities, anasarca, dizziness, palpitations Resp:+  shortness of breath with exertion or at rest.              No-   productive cough,  + non-productive cough,  No-  coughing up of blood.              No-  change in color of mucus.  No- wheezing.   Skin: No-   rash or lesions. GI:  No-   heartburn, indigestion, abdominal pain, nausea, vomiting,  GU: MS:  +  recent  joint pain or swelling. Neuro- grossly normal to observation, Psych:  No- change in mood or affect. No depression or anxiety.  No memory loss.   Objective:   Physical Exam General- Alert, Oriented, Affect-appropriate, Distress- none acute Skin- rash-none, lesions- none, excoriation- none Lymphadenopathy- none Head- atraumatic            Eyes- Gross vision intact, PERRLA, conjunctivae clear secretions            Ears- Hearing, canals normal            Nose- Clear, No-Septal dev, mucus, polyps, erosion, perforation             Throat- Mallampati II , mucosa clear , drainage- none, tonsils- atrophic. + face mask Neck- flexible , trachea midline, no stridor , thyroid nl, carotid no bruit Chest - symmetrical excursion , unlabored           Heart/CV- IRR with occasional extra beat , [id number] systolic murmur AS , no gallop  , no rub, nl s1 s2                           - JVD- none , edema- none, stasis changes- none, varices- none           Lung- clear to P&A, wheeze- none, light cough , dullness-none, rub- none           Chest wall- + pacemaker left Abd-  Br/ Gen/ Rectal- Not done, not indicated Extrem- cyanosis- none, clubbing, none, atrophy- none, strength- nl Neuro- grossly intact to observation

## 2015-04-30 NOTE — Assessment & Plan Note (Signed)
Now paced and regular on exam

## 2015-05-01 DIAGNOSIS — I10 Essential (primary) hypertension: Secondary | ICD-10-CM | POA: Diagnosis not present

## 2015-05-01 DIAGNOSIS — R072 Precordial pain: Secondary | ICD-10-CM | POA: Diagnosis not present

## 2015-05-01 DIAGNOSIS — E782 Mixed hyperlipidemia: Secondary | ICD-10-CM | POA: Diagnosis not present

## 2015-05-03 ENCOUNTER — Encounter: Payer: Self-pay | Admitting: Cardiovascular Disease

## 2015-05-03 ENCOUNTER — Ambulatory Visit (INDEPENDENT_AMBULATORY_CARE_PROVIDER_SITE_OTHER): Payer: Medicare Other | Admitting: Cardiovascular Disease

## 2015-05-03 VITALS — BP 132/74 | HR 70 | Ht 62.0 in | Wt 141.6 lb

## 2015-05-03 DIAGNOSIS — I480 Paroxysmal atrial fibrillation: Secondary | ICD-10-CM

## 2015-05-03 DIAGNOSIS — Z950 Presence of cardiac pacemaker: Secondary | ICD-10-CM

## 2015-05-03 DIAGNOSIS — I2510 Atherosclerotic heart disease of native coronary artery without angina pectoris: Secondary | ICD-10-CM

## 2015-05-03 DIAGNOSIS — I119 Hypertensive heart disease without heart failure: Secondary | ICD-10-CM | POA: Diagnosis not present

## 2015-05-03 DIAGNOSIS — I701 Atherosclerosis of renal artery: Secondary | ICD-10-CM

## 2015-05-03 DIAGNOSIS — Z7901 Long term (current) use of anticoagulants: Secondary | ICD-10-CM

## 2015-05-03 MED ORDER — METOPROLOL SUCCINATE ER 25 MG PO TB24: 25.0000 mg | ORAL_TABLET | Freq: Every day | ORAL | Status: DC

## 2015-05-03 NOTE — Patient Instructions (Signed)
Your physician has recommended you make the following change in your medication:   Decrease Metoprolol to 50 mg in the morning only (stop the PM dose). After one week decrease the morning dose to 25 mg daily.  Dr. Sallyanne Kuster recommends that you schedule a follow-up appointment in: 3 months with pacemaker check [facility]).
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
## 2015-05-04 DIAGNOSIS — Z7901 Long term (current) use of anticoagulants: Secondary | ICD-10-CM | POA: Insufficient documentation

## 2015-05-08 ENCOUNTER — Other Ambulatory Visit: Payer: Self-pay | Admitting: Cardiovascular Disease

## 2015-05-09 NOTE — Telephone Encounter (Signed)
Rx request sent to pharmacy.  

## 2015-06-10 ENCOUNTER — Other Ambulatory Visit: Payer: Self-pay | Admitting: Cardiovascular Disease

## 2015-06-27 DIAGNOSIS — H31001 Unspecified chorioretinal scars, right eye: Secondary | ICD-10-CM | POA: Diagnosis not present

## 2015-06-27 DIAGNOSIS — Z961 Presence of intraocular lens: Secondary | ICD-10-CM | POA: Diagnosis not present

## 2015-06-27 DIAGNOSIS — H5212 Myopia, left eye: Secondary | ICD-10-CM | POA: Diagnosis not present

## 2015-07-31 ENCOUNTER — Other Ambulatory Visit: Payer: Self-pay | Admitting: Cardiovascular Disease

## 2015-07-31 NOTE — Telephone Encounter (Signed)
Rx request sent to pharmacy.  

## 2015-08-03 ENCOUNTER — Encounter: Payer: Self-pay | Admitting: Cardiovascular Disease

## 2015-08-03 ENCOUNTER — Ambulatory Visit (INDEPENDENT_AMBULATORY_CARE_PROVIDER_SITE_OTHER): Payer: Medicare Other | Admitting: Cardiovascular Disease

## 2015-08-03 VITALS — BP 127/81 | HR 85 | Ht 62.0 in | Wt 143.0 lb

## 2015-08-03 DIAGNOSIS — E782 Mixed hyperlipidemia: Secondary | ICD-10-CM | POA: Diagnosis not present

## 2015-08-03 DIAGNOSIS — I480 Paroxysmal atrial fibrillation: Secondary | ICD-10-CM

## 2015-08-03 DIAGNOSIS — Z0000 Encounter for general adult medical examination without abnormal findings: Secondary | ICD-10-CM | POA: Diagnosis not present

## 2015-08-03 DIAGNOSIS — I714 Abdominal aortic aneurysm, without rupture, unspecified: Secondary | ICD-10-CM

## 2015-08-03 DIAGNOSIS — Z950 Presence of cardiac pacemaker: Secondary | ICD-10-CM | POA: Diagnosis not present

## 2015-08-03 DIAGNOSIS — Z7901 Long term (current) use of anticoagulants: Secondary | ICD-10-CM | POA: Diagnosis not present

## 2015-08-03 DIAGNOSIS — I119 Hypertensive heart disease without heart failure: Secondary | ICD-10-CM | POA: Diagnosis not present

## 2015-08-03 DIAGNOSIS — I10 Essential (primary) hypertension: Secondary | ICD-10-CM | POA: Diagnosis not present

## 2015-08-03 DIAGNOSIS — I25118 Atherosclerotic heart disease of native coronary artery with other forms of angina pectoris: Secondary | ICD-10-CM

## 2015-08-03 LAB — CUP PACEART INCLINIC DEVICE CHECK
Date Time Interrogation Session: 20170329133815
Implantable Lead Implant Date: 20131211
Implantable Lead Location: 753859
Implantable Lead Model: 4136
Implantable Lead Serial Number: 29317536
Lead Channel Setting Pacing Amplitude: 2.4 V
Lead Channel Setting Pacing Pulse Width: 0.5 ms
Lead Channel Setting Sensing Sensitivity: 2.5 mV
MDC IDC LEAD IMPLANT DT: 20131211
MDC IDC LEAD LOCATION: 753860
MDC IDC LEAD MODEL: 4135
MDC IDC LEAD SERIAL: 29296655
MDC IDC SET LEADCHNL RA PACING AMPLITUDE: 2 V
Pulse Gen Serial Number: 144309
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
## 2015-08-03 NOTE — Patient Instructions (Signed)
Your physician recommends that you continue on your current medications as directed. Please refer to the Current Medication list given to you today.  Dr Sallyanne Kuster recommends that you schedule a follow-up appointment in 3 months.  If you need a refill on your cardiac medications before your next appointment, please call your pharmacy.

## 2015-08-05 ENCOUNTER — Encounter (HOSPITAL_COMMUNITY): Payer: Self-pay | Admitting: Emergency Medicine

## 2015-08-05 ENCOUNTER — Emergency Department (HOSPITAL_COMMUNITY)
Admission: EM | Admit: 2015-08-05 | Discharge: 2015-08-05 | Disposition: A | Discharge status: Home / Self Care | Payer: Medicare Other | Attending: Emergency Medicine | Admitting: Emergency Medicine

## 2015-08-05 DIAGNOSIS — K219 Gastro-esophageal reflux disease without esophagitis: Secondary | ICD-10-CM | POA: Insufficient documentation

## 2015-08-05 DIAGNOSIS — Z85840 Personal history of malignant neoplasm of eye: Secondary | ICD-10-CM | POA: Diagnosis not present

## 2015-08-05 DIAGNOSIS — Z79899 Other long term (current) drug therapy: Secondary | ICD-10-CM | POA: Insufficient documentation

## 2015-08-05 DIAGNOSIS — R531 Weakness: Secondary | ICD-10-CM | POA: Diagnosis not present

## 2015-08-05 DIAGNOSIS — Z9049 Acquired absence of other specified parts of digestive tract: Secondary | ICD-10-CM | POA: Insufficient documentation

## 2015-08-05 DIAGNOSIS — Z86010 Personal history of colonic polyps: Secondary | ICD-10-CM | POA: Diagnosis not present

## 2015-08-05 DIAGNOSIS — Z8739 Personal history of other diseases of the musculoskeletal system and connective tissue: Secondary | ICD-10-CM | POA: Diagnosis not present

## 2015-08-05 DIAGNOSIS — Z950 Presence of cardiac pacemaker: Secondary | ICD-10-CM | POA: Insufficient documentation

## 2015-08-05 DIAGNOSIS — Z9889 Other specified postprocedural states: Secondary | ICD-10-CM | POA: Diagnosis not present

## 2015-08-05 DIAGNOSIS — E785 Hyperlipidemia, unspecified: Secondary | ICD-10-CM | POA: Diagnosis not present

## 2015-08-05 DIAGNOSIS — R197 Diarrhea, unspecified: Secondary | ICD-10-CM

## 2015-08-05 DIAGNOSIS — I10 Essential (primary) hypertension: Secondary | ICD-10-CM | POA: Insufficient documentation

## 2015-08-05 DIAGNOSIS — M1990 Unspecified osteoarthritis, unspecified site: Secondary | ICD-10-CM | POA: Insufficient documentation

## 2015-08-05 DIAGNOSIS — I4891 Unspecified atrial fibrillation: Secondary | ICD-10-CM | POA: Insufficient documentation

## 2015-08-05 DIAGNOSIS — R112 Nausea with vomiting, unspecified: Secondary | ICD-10-CM

## 2015-08-05 DIAGNOSIS — J449 Chronic obstructive pulmonary disease, unspecified: Secondary | ICD-10-CM | POA: Insufficient documentation

## 2015-08-05 DIAGNOSIS — K529 Noninfective gastroenteritis and colitis, unspecified: Secondary | ICD-10-CM

## 2015-08-05 DIAGNOSIS — Z8669 Personal history of other diseases of the nervous system and sense organs: Secondary | ICD-10-CM | POA: Diagnosis not present

## 2015-08-05 DIAGNOSIS — R011 Cardiac murmur, unspecified: Secondary | ICD-10-CM | POA: Insufficient documentation

## 2015-08-05 DIAGNOSIS — R404 Transient alteration of awareness: Secondary | ICD-10-CM | POA: Diagnosis not present

## 2015-08-05 LAB — URINE MICROSCOPIC-ADD ON: RBC / HPF: NONE SEEN RBC/hpf (ref 0–5)

## 2015-08-05 LAB — URINALYSIS, ROUTINE W REFLEX MICROSCOPIC
GLUCOSE, UA: NEGATIVE mg/dL
Hgb urine dipstick: NEGATIVE
KETONES UR: NEGATIVE mg/dL
Nitrite: POSITIVE — AB
PH: 5 (ref 5.0–8.0)
PROTEIN: 30 mg/dL — AB
SPECIFIC GRAVITY, URINE: 1.026 (ref 1.005–1.030)

## 2015-08-05 LAB — COMPREHENSIVE METABOLIC PANEL
ALT: 16 U/L (ref 14–54)
AST: 23 U/L (ref 15–41)
Albumin: 3.9 g/dL (ref 3.5–5.0)
Alkaline Phosphatase: 59 U/L (ref 38–126)
Anion gap: 9 (ref 5–15)
BUN: 21 mg/dL — AB (ref 6–20)
CHLORIDE: 110 mmol/L (ref 101–111)
CO2: 23 mmol/L (ref 22–32)
Calcium: 9 mg/dL (ref 8.9–10.3)
Creatinine, Ser: 1.07 mg/dL — ABNORMAL HIGH (ref 0.44–1.00)
GFR calc Af Amer: 53 mL/min — ABNORMAL LOW (ref >60)
GFR, EST NON AFRICAN AMERICAN: 45 mL/min — AB (ref >60)
Glucose, Bld: 127 mg/dL — ABNORMAL HIGH (ref 65–99)
POTASSIUM: 4.5 mmol/L (ref 3.5–5.1)
Sodium: 142 mmol/L (ref 135–145)
Total Bilirubin: 1.6 mg/dL — ABNORMAL HIGH (ref 0.3–1.2)
Total Protein: 6.3 g/dL — ABNORMAL LOW (ref 6.5–8.1)

## 2015-08-05 LAB — CBC
HEMATOCRIT: 45.3 % (ref 36.0–46.0)
HEMOGLOBIN: 15.9 g/dL — AB (ref 12.0–15.0)
MCH: 33.5 pg (ref 26.0–34.0)
MCHC: 35.1 g/dL (ref 30.0–36.0)
MCV: 95.6 fL (ref 78.0–100.0)
Platelets: 150 10*3/uL (ref 150–400)
RBC: 4.74 MIL/uL (ref 3.87–5.11)
RDW: 12.9 % (ref 11.5–15.5)
WBC: 10.3 10*3/uL (ref 4.0–10.5)

## 2015-08-05 LAB — I-STAT CG4 LACTIC ACID, ED: LACTIC ACID, VENOUS: 1.51 mmol/L (ref 0.5–2.0)

## 2015-08-05 LAB — LIPASE, BLOOD: LIPASE: 31 U/L (ref 11–51)

## 2015-08-05 MED ORDER — SODIUM CHLORIDE 0.9 % IV BOLUS (SEPSIS)
1000.0000 mL | Freq: Once | INTRAVENOUS | Status: AC
  Administered 2015-08-05: 1000 mL via INTRAVENOUS

## 2015-08-05 NOTE — Discharge Instructions (Signed)
It was our pleasure to provide your ER care today - we hope that you feel better.  Rest. Drink plenty of fluids.  Follow up with primary care doctor Monday if symptoms fail to improve/resolve.  Return to ER if worse, persistent vomiting, worsening or severe abdominal pain, weak/fainting, other concern.    Viral Gastroenteritis Viral gastroenteritis is also known as stomach flu. This condition affects the stomach and intestinal tract. It can cause sudden diarrhea and vomiting. The illness typically lasts 3 to 8 days. Most people develop an immune response that eventually gets rid of the virus. While this natural response develops, the virus can make you quite ill. CAUSES  Many different viruses can cause gastroenteritis, such as rotavirus or noroviruses. You can catch one of these viruses by consuming contaminated food or water. You may also catch a virus by sharing utensils or other personal items with an infected person or by touching a contaminated surface. SYMPTOMS  The most common symptoms are diarrhea and vomiting. These problems can cause a severe loss of body fluids (dehydration) and a body salt (electrolyte) imbalance. Other symptoms may include:  Fever.  Headache.  Fatigue.  Abdominal pain. DIAGNOSIS  Your caregiver can usually diagnose viral gastroenteritis based on your symptoms and a physical exam. A stool sample may also be taken to test for the presence of viruses or other infections. TREATMENT  This illness typically goes away on its own. Treatments are aimed at rehydration. The most serious cases of viral gastroenteritis involve vomiting so severely that you are not able to keep fluids down. In these cases, fluids must be given through an intravenous line (IV). HOME CARE INSTRUCTIONS   Drink enough fluids to keep your urine clear or pale yellow. Drink small amounts of fluids frequently and increase the amounts as tolerated.  Ask your caregiver for specific rehydration  instructions.  Avoid:  Foods high in sugar.  Alcohol.  Carbonated drinks.  Tobacco.  Juice.  Caffeine drinks.  Extremely hot or cold fluids.  Fatty, greasy foods.  Too much intake of anything at one time.  Dairy products until 24 to 48 hours after diarrhea stops.  You may consume probiotics. Probiotics are active cultures of beneficial bacteria. They may lessen the amount and number of diarrheal stools in adults. Probiotics can be found in yogurt with active cultures and in supplements.  Wash your hands well to avoid spreading the virus.  Only take over-the-counter or prescription medicines for pain, discomfort, or fever as directed by your caregiver. Do not give aspirin to children. Antidiarrheal medicines are not recommended.  Ask your caregiver if you should continue to take your regular prescribed and over-the-counter medicines.  Keep all follow-up appointments as directed by your caregiver. SEEK IMMEDIATE MEDICAL CARE IF:   You are unable to keep fluids down.  You do not urinate at least once every 6 to 8 hours.  You develop shortness of breath.  You notice blood in your stool or vomit. This may look like coffee grounds.  You have abdominal pain that increases or is concentrated in one small area (localized).  You have persistent vomiting or diarrhea.  You have a fever.  The patient is a child younger than 3 months, and he or she has a fever.  The patient is a child older than 3 months, and he or she has a fever and persistent symptoms.  The patient is a child older than 3 months, and he or she has a fever and symptoms  suddenly get worse.  The patient is a baby, and he or she has no tears when crying. MAKE SURE YOU:   Understand these instructions.  Will watch your condition.  Will get help right away if you are not doing well or get worse.   This information is not intended to replace advice given to you by your health care provider. Make sure you  discuss any questions you have with your health care provider.   Document Released: 04/29/2005 Document Revised: 07/22/2011 Document Reviewed: 02/13/2011 Elsevier Interactive Patient Education 2016 [place].  Diarrhea Diarrhea is frequent loose and watery bowel movements. It can cause you to feel weak and dehydrated. Dehydration can cause you to become tired and thirsty, have a dry mouth, and have decreased urination that often is dark yellow. Diarrhea is a sign of another problem, most often an infection that will not last long. In most cases, diarrhea typically lasts 2-3 days. However, it can last longer if it is a sign of something more serious. It is important to treat your diarrhea as directed by your caregiver to lessen or prevent future episodes of diarrhea. CAUSES  Some common causes include:  Gastrointestinal infections caused by viruses, bacteria, or parasites.  Food poisoning or food allergies.  Certain medicines, such as antibiotics, chemotherapy, and laxatives.  Artificial sweeteners and fructose.  Digestive disorders. HOME CARE INSTRUCTIONS  Ensure adequate fluid intake (hydration): Have 1 cup (8 oz) of fluid for each diarrhea episode. Avoid fluids that contain simple sugars or sports drinks, fruit juices, whole milk products, and sodas. Your urine should be clear or pale yellow if you are drinking enough fluids. Hydrate with an oral rehydration solution that you can purchase at pharmacies, retail stores, and online. You can prepare an oral rehydration solution at home by mixing the following ingredients together:   - tsp table salt.   tsp baking soda.   tsp salt substitute containing potassium chloride.  1  tablespoons sugar.  1 L (34 oz) of water.  Certain foods and beverages may increase the speed at which food moves through the gastrointestinal (GI) tract. These foods and beverages should be avoided and include:  Caffeinated and alcoholic  beverages.  High-fiber foods, such as raw fruits and vegetables, nuts, seeds, and whole grain breads and cereals.  Foods and beverages sweetened with sugar alcohols, such as xylitol, sorbitol, and mannitol.  Some foods may be well tolerated and may help thicken stool including:  Starchy foods, such as rice, toast, pasta, low-sugar cereal, oatmeal, grits, baked potatoes, crackers, and bagels.  Bananas.  Applesauce.  Add probiotic-rich foods to help increase healthy bacteria in the GI tract, such as yogurt and fermented milk products.  Wash your hands well after each diarrhea episode.  Only take over-the-counter or prescription medicines as directed by your caregiver.  Take a warm bath to relieve any burning or pain from frequent diarrhea episodes. SEEK IMMEDIATE MEDICAL CARE IF:   You are unable to keep fluids down.  You have persistent vomiting.  You have blood in your stool, or your stools are black and tarry.  You do not urinate in 6-8 hours, or there is only a small amount of very dark urine.  You have abdominal pain that increases or localizes.  You have weakness, dizziness, confusion, or light-headedness.  You have a severe headache.  Your diarrhea gets worse or does not get better.  You have a fever or persistent symptoms for more than 2-3 days.  You have  a fever and your symptoms suddenly get worse. MAKE SURE YOU:   Understand these instructions.  Will watch your condition.  Will get help right away if you are not doing well or get worse.   This information is not intended to replace advice given to you by your health care provider. Make sure you discuss any questions you have with your health care provider.   Document Released: 04/19/2002 Document Revised: 05/20/2014 Document Reviewed: 01/05/2012 Elsevier Interactive Patient Education 2016 Elsevier Inc.    Nausea and Vomiting Nausea is a sick feeling that often comes before throwing up (vomiting).  Vomiting is a reflex where stomach contents come out of your mouth. Vomiting can cause severe loss of body fluids (dehydration). Children and elderly adults can become dehydrated quickly, especially if they also have diarrhea. Nausea and vomiting are symptoms of a condition or disease. It is important to find the cause of your symptoms. CAUSES   Direct irritation of the stomach lining. This irritation can result from increased acid production (gastroesophageal reflux disease), infection, food poisoning, taking certain medicines (such as nonsteroidal anti-inflammatory drugs), alcohol use, or tobacco use.  Signals from the brain.These signals could be caused by a headache, heat exposure, an inner ear disturbance, increased pressure in the brain from injury, infection, a tumor, or a concussion, pain, emotional stimulus, or metabolic problems.  An obstruction in the gastrointestinal tract (bowel obstruction).  Illnesses such as diabetes, hepatitis, gallbladder problems, appendicitis, kidney problems, cancer, sepsis, atypical symptoms of a heart attack, or eating disorders.  Medical treatments such as chemotherapy and radiation.  Receiving medicine that makes you sleep (general anesthetic) during surgery. DIAGNOSIS Your caregiver may ask for tests to be done if the problems do not improve after a few days. Tests may also be done if symptoms are severe or if the reason for the nausea and vomiting is not clear. Tests may include:  Urine tests.  Blood tests.  Stool tests.  Cultures (to look for evidence of infection).  X-rays or other imaging studies. Test results can help your caregiver make decisions about treatment or the need for additional tests. TREATMENT You need to stay well hydrated. Drink frequently but in small amounts.You may wish to drink water, sports drinks, clear broth, or eat frozen ice pops or gelatin dessert to help stay hydrated.When you eat, eating slowly may help prevent  nausea.There are also some antinausea medicines that may help prevent nausea. HOME CARE INSTRUCTIONS   Take all medicine as directed by your caregiver.  If you do not have an appetite, do not force yourself to eat. However, you must continue to drink fluids.  If you have an appetite, eat a normal diet unless your caregiver tells you differently.  Eat a variety of complex carbohydrates (rice, wheat, potatoes, bread), lean meats, yogurt, fruits, and vegetables.  Avoid high-fat foods because they are more difficult to digest.  Drink enough water and fluids to keep your urine clear or pale yellow.  If you are dehydrated, ask your caregiver for specific rehydration instructions. Signs of dehydration may include:  Severe thirst.  Dry lips and mouth.  Dizziness.  Dark urine.  Decreasing urine frequency and amount.  Confusion.  Rapid breathing or pulse. SEEK IMMEDIATE MEDICAL CARE IF:   You have blood or brown flecks (like coffee grounds) in your vomit.  You have black or bloody stools.  You have a severe headache or stiff neck.  You are confused.  You have severe abdominal pain.  You  have chest pain or trouble breathing.  You do not urinate at least once every 8 hours.  You develop cold or clammy skin.  You continue to vomit for longer than 24 to 48 hours.  You have a fever. MAKE SURE YOU:   Understand these instructions.  Will watch your condition.  Will get help right away if you are not doing well or get worse.   This information is not intended to replace advice given to you by your health care provider. Make sure you discuss any questions you have with your health care provider.   Document Released: 04/29/2005 Document Revised: 07/22/2011 Document Reviewed: 09/26/2010 Elsevier Interactive Patient Education [employer].

## 2015-08-05 NOTE — ED Notes (Addendum)
Per EMS, Pt from home, pt woke up at 2 am c/o fever, chills, N/V/D. Pt warm to touch. A&Ox4. Pt ambulatory. Received 250 MLs NS and 4 mg zofran IV.
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
## 2015-08-05 NOTE — ED Notes (Signed)
Patient given cup of water to drink.  

## 2015-08-05 NOTE — ED Notes (Signed)
Discharge instructions and follow up care reviewed with patient. Patient verbalized understanding. 

## 2015-08-05 NOTE — ED Notes (Signed)
Bed: WA08 Expected date:  Expected time:  Means of arrival:  Comments: EMS- 82 yo- Flu-like s/sx

## 2015-08-10 DIAGNOSIS — I10 Essential (primary) hypertension: Secondary | ICD-10-CM | POA: Diagnosis not present

## 2015-08-10 DIAGNOSIS — I480 Paroxysmal atrial fibrillation: Secondary | ICD-10-CM | POA: Diagnosis not present

## 2015-08-10 DIAGNOSIS — M150 Primary generalized (osteo)arthritis: Secondary | ICD-10-CM | POA: Diagnosis not present

## 2015-08-10 DIAGNOSIS — E782 Mixed hyperlipidemia: Secondary | ICD-10-CM | POA: Diagnosis not present

## 2015-08-10 LAB — CULTURE, BLOOD (ROUTINE X 2)
CULTURE: NO GROWTH
Culture: NO GROWTH

## 2015-09-02 ENCOUNTER — Other Ambulatory Visit: Payer: Self-pay | Admitting: Cardiovascular Disease

## 2015-09-04 NOTE — Telephone Encounter (Signed)
REFILL 

## 2015-09-11 ENCOUNTER — Other Ambulatory Visit: Payer: Self-pay | Admitting: Cardiovascular Disease

## 2015-09-11 NOTE — Telephone Encounter (Signed)
REFILL 

## 2015-09-16 ENCOUNTER — Other Ambulatory Visit: Payer: Self-pay | Admitting: Cardiovascular Disease

## 2015-09-18 NOTE — Telephone Encounter (Signed)
Rx request sent to pharmacy.  

## 2015-10-25 ENCOUNTER — Encounter: Payer: Self-pay | Admitting: Cardiovascular Disease

## 2015-10-25 ENCOUNTER — Other Ambulatory Visit: Payer: Self-pay | Admitting: Cardiovascular Disease

## 2015-10-25 ENCOUNTER — Ambulatory Visit (INDEPENDENT_AMBULATORY_CARE_PROVIDER_SITE_OTHER): Payer: Medicare Other | Admitting: Cardiovascular Disease

## 2015-10-25 VITALS — BP 163/94 | HR 72 | Ht 62.0 in | Wt 145.0 lb

## 2015-10-25 DIAGNOSIS — I480 Paroxysmal atrial fibrillation: Secondary | ICD-10-CM | POA: Diagnosis not present

## 2015-10-25 DIAGNOSIS — I714 Abdominal aortic aneurysm, without rupture, unspecified: Secondary | ICD-10-CM

## 2015-10-25 DIAGNOSIS — I701 Atherosclerosis of renal artery: Secondary | ICD-10-CM | POA: Diagnosis not present

## 2015-10-25 DIAGNOSIS — Z7901 Long term (current) use of anticoagulants: Secondary | ICD-10-CM | POA: Diagnosis not present

## 2015-10-25 DIAGNOSIS — Z950 Presence of cardiac pacemaker: Secondary | ICD-10-CM

## 2015-10-25 DIAGNOSIS — I25118 Atherosclerotic heart disease of native coronary artery with other forms of angina pectoris: Secondary | ICD-10-CM

## 2015-10-25 MED ORDER — METOPROLOL SUCCINATE ER 50 MG PO TB24: 50.0000 mg | ORAL_TABLET | Freq: Every day | ORAL | Status: DC

## 2015-10-25 MED ORDER — METOPROLOL SUCCINATE ER 50 MG PO TB24: 50.0000 mg | ORAL_TABLET | Freq: Two times a day (BID) | ORAL | Status: DC

## 2015-10-25 NOTE — Patient Instructions (Signed)
Medication Instructions: Dr Sallyanne Kuster has recommended making the following medication changes: 1. INCREASE Metoprolol to 50 mg daily  Labwork: NONE ORDERED  Testing/Procedures: 1. Renal Artery Doppler - Your physician has requested that you have a renal artery duplex. During this test, an ultrasound is used to evaluate blood flow to the kidneys. Allow one hour for this exam. Do not eat after midnight the day before and avoid carbonated beverages. Take your medications as you usually do.  Follow-up: Dr Sallyanne Kuster recommends that you schedule a follow-up appointment in 3 months.  You have been referred back to Dr Caryl Comes for atrial fibrillation.  If you need a refill on your cardiac medications before your next appointment, please call your pharmacy.
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
## 2015-10-30 ENCOUNTER — Other Ambulatory Visit: Payer: Self-pay | Admitting: *Deleted

## 2015-10-30 DIAGNOSIS — H8113 Benign paroxysmal vertigo, bilateral: Secondary | ICD-10-CM | POA: Diagnosis not present

## 2015-10-30 MED ORDER — METOPROLOL SUCCINATE ER 50 MG PO TB24: 50.0000 mg | ORAL_TABLET | Freq: Two times a day (BID) | ORAL | Status: DC

## 2015-10-30 NOTE — Telephone Encounter (Signed)
Patient stated that per the pharmacy, Dr Croitoru's office denied her request to have metoprolol refilled.

## 2015-10-31 ENCOUNTER — Encounter: Payer: Medicare Other | Admitting: Internal Medicine

## 2015-11-01 ENCOUNTER — Ambulatory Visit (INDEPENDENT_AMBULATORY_CARE_PROVIDER_SITE_OTHER): Payer: Medicare Other | Admitting: Internal Medicine

## 2015-11-01 ENCOUNTER — Encounter: Payer: Self-pay | Admitting: Internal Medicine

## 2015-11-01 VITALS — BP 142/108 | HR 70 | Ht 62.0 in | Wt 144.2 lb

## 2015-11-01 DIAGNOSIS — Z950 Presence of cardiac pacemaker: Secondary | ICD-10-CM

## 2015-11-01 DIAGNOSIS — I701 Atherosclerosis of renal artery: Secondary | ICD-10-CM | POA: Diagnosis not present

## 2015-11-01 DIAGNOSIS — I480 Paroxysmal atrial fibrillation: Secondary | ICD-10-CM | POA: Diagnosis not present
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
## 2015-11-01 NOTE — Patient Instructions (Signed)
Medication Instructions: - Your physician has recommended you make the following change in your medication:  1) Stop Tikosyn 2) In 2 weeks ( around 11/15/15) stop Ranexa  Labwork: - none   Procedures/Testing: - none  Follow-Up: - Dr. Caryl Comes will see you back on an as needed basis.  Any Additional Special Instructions Will Be Listed Below (If Applicable).     If you need a refill on your cardiac medications before your next appointment, please call your pharmacy.

## 2015-11-02 LAB — CUP PACEART INCLINIC DEVICE CHECK
Brady Statistic RV Percent Paced: 100 %
Implantable Lead Implant Date: 20131211
Implantable Lead Location: 753859
Implantable Lead Model: 4135
Implantable Lead Serial Number: 29317536
Lead Channel Impedance Value: 634 Ohm
Lead Channel Impedance Value: 641 Ohm
Lead Channel Setting Pacing Amplitude: 2.4 V
Lead Channel Setting Pacing Pulse Width: 0.5 ms
MDC IDC LEAD IMPLANT DT: 20131211
MDC IDC LEAD LOCATION: 753860
MDC IDC LEAD MODEL: 4136
MDC IDC LEAD SERIAL: 29296655
MDC IDC MSMT LEADCHNL RA SENSING INTR AMPL: 8 mV
MDC IDC MSMT LEADCHNL RV PACING THRESHOLD AMPLITUDE: 1 V
MDC IDC MSMT LEADCHNL RV PACING THRESHOLD PULSEWIDTH: 0.5 ms
MDC IDC SESS DTM: 20170622083627
MDC IDC SET LEADCHNL RA PACING AMPLITUDE: 2 V
MDC IDC SET LEADCHNL RV SENSING SENSITIVITY: 2.5 mV
MDC IDC STAT BRADY RA PERCENT PACED: 12 %
Pulse Gen Serial Number: 144309

## 2015-11-06 ENCOUNTER — Ambulatory Visit (HOSPITAL_COMMUNITY)
Admission: RE | Admit: 2015-11-06 | Discharge: 2015-11-06 | Disposition: A | Discharge status: Home / Self Care | Payer: Medicare Other | Source: Ambulatory Visit | Attending: Cardiovascular Disease | Admitting: Cardiovascular Disease

## 2015-11-06 DIAGNOSIS — E785 Hyperlipidemia, unspecified: Secondary | ICD-10-CM | POA: Insufficient documentation

## 2015-11-06 DIAGNOSIS — I10 Essential (primary) hypertension: Secondary | ICD-10-CM | POA: Insufficient documentation

## 2015-11-06 DIAGNOSIS — J449 Chronic obstructive pulmonary disease, unspecified: Secondary | ICD-10-CM | POA: Insufficient documentation

## 2015-11-06 DIAGNOSIS — H8113 Benign paroxysmal vertigo, bilateral: Secondary | ICD-10-CM | POA: Diagnosis not present

## 2015-11-06 DIAGNOSIS — K219 Gastro-esophageal reflux disease without esophagitis: Secondary | ICD-10-CM | POA: Insufficient documentation

## 2015-11-06 DIAGNOSIS — I701 Atherosclerosis of renal artery: Secondary | ICD-10-CM | POA: Diagnosis not present

## 2015-11-06 DIAGNOSIS — I719 Aortic aneurysm of unspecified site, without rupture: Secondary | ICD-10-CM | POA: Diagnosis not present

## 2015-11-10 ENCOUNTER — Telehealth: Payer: Self-pay | Admitting: Cardiovascular Disease

## 2015-11-10 DIAGNOSIS — R42 Dizziness and giddiness: Secondary | ICD-10-CM | POA: Diagnosis not present

## 2015-11-10 DIAGNOSIS — H8112 Benign paroxysmal vertigo, left ear: Secondary | ICD-10-CM | POA: Diagnosis not present

## 2015-11-10 DIAGNOSIS — H903 Sensorineural hearing loss, bilateral: Secondary | ICD-10-CM | POA: Diagnosis not present

## 2015-11-10 NOTE — Telephone Encounter (Signed)
Pt returned call re renal US results-pls call

## 2015-11-10 NOTE — Telephone Encounter (Signed)
Called patient with results. Patient verbalized understanding. 

## 2015-11-22 ENCOUNTER — Other Ambulatory Visit: Payer: Self-pay | Admitting: Physician Assistant

## 2015-11-22 DIAGNOSIS — R42 Dizziness and giddiness: Secondary | ICD-10-CM

## 2015-11-25 ENCOUNTER — Other Ambulatory Visit: Payer: Self-pay | Admitting: Cardiovascular Disease

## 2015-11-27 NOTE — Telephone Encounter (Signed)
Rx(s) sent to pharmacy electronically.  

## 2015-11-28 LAB — CUP PACEART INCLINIC DEVICE CHECK
Date Time Interrogation Session: 20170718160730
Implantable Lead Implant Date: 20131211
Implantable Lead Location: 753859
Implantable Lead Serial Number: 29317536
MDC IDC LEAD IMPLANT DT: 20131211
MDC IDC LEAD LOCATION: 753860
MDC IDC LEAD MODEL: 4135
MDC IDC LEAD MODEL: 4136
MDC IDC LEAD SERIAL: 29296655
Pulse Gen Serial Number: 144309

## 2015-11-30 ENCOUNTER — Ambulatory Visit
Admission: RE | Admit: 2015-11-30 | Discharge: 2015-11-30 | Disposition: A | Discharge status: Home / Self Care | Payer: Medicare Other | Source: Ambulatory Visit | Attending: Physician Assistant | Admitting: Physician Assistant

## 2015-11-30 DIAGNOSIS — R42 Dizziness and giddiness: Secondary | ICD-10-CM

## 2015-11-30 DIAGNOSIS — I6523 Occlusion and stenosis of bilateral carotid arteries: Secondary | ICD-10-CM | POA: Diagnosis not present

## 2015-11-30 MED ORDER — IOPAMIDOL (ISOVUE-370) INJECTION 76%
100.0000 mL | Freq: Once | INTRAVENOUS | Status: AC | PRN
  Administered 2015-11-30: 100 mL via INTRAVENOUS

## 2015-12-05 ENCOUNTER — Other Ambulatory Visit: Payer: Self-pay | Admitting: Cardiovascular Disease

## 2015-12-05 NOTE — Telephone Encounter (Signed)
REFILL 

## 2015-12-09 ENCOUNTER — Other Ambulatory Visit: Payer: Self-pay | Admitting: Cardiovascular Disease

## 2015-12-11 ENCOUNTER — Other Ambulatory Visit: Payer: Self-pay | Admitting: Cardiovascular Disease

## 2015-12-11 NOTE — Telephone Encounter (Signed)
REFILL 

## 2015-12-21 DIAGNOSIS — M150 Primary generalized (osteo)arthritis: Secondary | ICD-10-CM | POA: Diagnosis not present

## 2015-12-21 DIAGNOSIS — E782 Mixed hyperlipidemia: Secondary | ICD-10-CM | POA: Diagnosis not present

## 2015-12-21 DIAGNOSIS — I10 Essential (primary) hypertension: Secondary | ICD-10-CM | POA: Diagnosis not present

## 2015-12-21 DIAGNOSIS — I480 Paroxysmal atrial fibrillation: Secondary | ICD-10-CM | POA: Diagnosis not present

## 2015-12-28 DIAGNOSIS — I10 Essential (primary) hypertension: Secondary | ICD-10-CM | POA: Diagnosis not present

## 2015-12-28 DIAGNOSIS — E782 Mixed hyperlipidemia: Secondary | ICD-10-CM | POA: Diagnosis not present

## 2016-01-23 ENCOUNTER — Encounter: Payer: Self-pay | Admitting: Cardiovascular Disease

## 2016-01-23 ENCOUNTER — Ambulatory Visit (INDEPENDENT_AMBULATORY_CARE_PROVIDER_SITE_OTHER): Payer: Medicare Other | Admitting: Cardiovascular Disease

## 2016-01-23 VITALS — BP 160/88 | HR 83 | Ht 62.0 in | Wt 149.4 lb

## 2016-01-23 DIAGNOSIS — I2510 Atherosclerotic heart disease of native coronary artery without angina pectoris: Secondary | ICD-10-CM | POA: Diagnosis not present

## 2016-01-23 DIAGNOSIS — Z7901 Long term (current) use of anticoagulants: Secondary | ICD-10-CM

## 2016-01-23 DIAGNOSIS — I442 Atrioventricular block, complete: Secondary | ICD-10-CM

## 2016-01-23 DIAGNOSIS — I481 Persistent atrial fibrillation: Secondary | ICD-10-CM

## 2016-01-23 DIAGNOSIS — E785 Hyperlipidemia, unspecified: Secondary | ICD-10-CM

## 2016-01-23 DIAGNOSIS — I10 Essential (primary) hypertension: Secondary | ICD-10-CM

## 2016-01-23 DIAGNOSIS — Z950 Presence of cardiac pacemaker: Secondary | ICD-10-CM

## 2016-01-23 DIAGNOSIS — I714 Abdominal aortic aneurysm, without rupture, unspecified: Secondary | ICD-10-CM

## 2016-01-23 DIAGNOSIS — I701 Atherosclerosis of renal artery: Secondary | ICD-10-CM

## 2016-01-23 DIAGNOSIS — I4819 Other persistent atrial fibrillation: Secondary | ICD-10-CM

## 2016-01-23 HISTORY — DX: Atrioventricular block, complete: I44.2

## 2016-01-23 MED ORDER — MAGNESIUM OXIDE 400 (241.3 MG) MG PO TABS: 1.0000 | ORAL_TABLET | Freq: Every day | ORAL | 1 refills | Status: DC

## 2016-01-23 NOTE — Patient Instructions (Signed)
Dr Sallyanne Kuster recommends that you continue on your current medications as directed. Please refer to the Current Medication list given to you today.  Remote monitoring is used to monitor your Pacemaker of ICD from home. This monitoring reduces the number of office visits required to check your device to one time per year. It allows [place] to keep an eye on the functioning of your device to ensure it is working properly. You are scheduled for a device check from home on Tuesday, December 12th, 2017. You may send your transmission at any time that day. If you have a wireless device, the transmission will be sent automatically. After your physician reviews your transmission, you will receive a postcard with your next transmission date.  Dr Sallyanne Kuster recommends that you schedule a follow-up appointment in 6 months with a device check. You will receive a reminder letter in the mail two months in advance. If you don't receive a letter, please call our office to schedule the follow-up appointment.  If you need a refill on your cardiac medications before your next appointment, please call your pharmacy.
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
## 2016-02-07 ENCOUNTER — Encounter: Payer: Medicare Other | Admitting: Cardiovascular Disease

## 2016-02-10 ENCOUNTER — Other Ambulatory Visit: Payer: Self-pay | Admitting: Cardiovascular Disease

## 2016-02-12 NOTE — Telephone Encounter (Signed)
Rx request sent to pharmacy.  

## 2016-02-13 LAB — CUP PACEART INCLINIC DEVICE CHECK
Date Time Interrogation Session: 20171003150207
Implantable Lead Implant Date: 20131211
Implantable Lead Model: 4135
Implantable Lead Model: 4136
Implantable Lead Serial Number: 29296655
Lead Channel Setting Pacing Amplitude: 2.4 V
Lead Channel Setting Sensing Sensitivity: 2.5 mV
MDC IDC LEAD IMPLANT DT: 20131211
MDC IDC LEAD LOCATION: 753859
MDC IDC LEAD LOCATION: 753860
MDC IDC LEAD SERIAL: 29317536
MDC IDC SET LEADCHNL RV PACING PULSEWIDTH: 0.5 ms
Pulse Gen Serial Number: 144309

## 2016-03-11 DIAGNOSIS — Z23 Encounter for immunization: Secondary | ICD-10-CM | POA: Diagnosis not present

## 2016-03-16 ENCOUNTER — Other Ambulatory Visit: Payer: Self-pay | Admitting: Cardiovascular Disease

## 2016-03-18 NOTE — Telephone Encounter (Signed)
Rx request sent to pharmacy.  

## 2016-04-22 DIAGNOSIS — D225 Melanocytic nevi of trunk: Secondary | ICD-10-CM | POA: Diagnosis not present

## 2016-04-22 DIAGNOSIS — L570 Actinic keratosis: Secondary | ICD-10-CM | POA: Diagnosis not present

## 2016-04-23 ENCOUNTER — Telehealth: Payer: Self-pay | Admitting: Cardiology

## 2016-04-23 ENCOUNTER — Ambulatory Visit (INDEPENDENT_AMBULATORY_CARE_PROVIDER_SITE_OTHER): Payer: Medicare Other | Admitting: *Deleted

## 2016-04-23 DIAGNOSIS — I442 Atrioventricular block, complete: Secondary | ICD-10-CM

## 2016-04-23 NOTE — Telephone Encounter (Signed)
Spoke with pt and reminded pt of remote transmission that is due today. Pt verbalized understanding.   

## 2016-04-23 NOTE — Progress Notes (Signed)
Remote pacemaker transmission.   

## 2016-05-01 ENCOUNTER — Encounter: Payer: Self-pay | Admitting: Cardiology

## 2016-05-20 LAB — CUP PACEART REMOTE DEVICE CHECK
Battery Remaining Longevity: 60 mo
Brady Statistic RV Percent Paced: 99 %
Implantable Lead Implant Date: 20131211
Implantable Lead Location: 753859
Implantable Lead Model: 4136
Implantable Lead Serial Number: 29296655
MDC IDC LEAD IMPLANT DT: 20131211
MDC IDC LEAD LOCATION: 753860
MDC IDC LEAD MODEL: 4135
MDC IDC LEAD SERIAL: 29317536
MDC IDC MSMT LEADCHNL RV IMPEDANCE VALUE: 619 Ohm
MDC IDC MSMT LEADCHNL RV SENSING INTR AMPL: 16.7 mV
MDC IDC PG IMPLANT DT: 20131211
MDC IDC SESS DTM: 20180108105725
Pulse Gen Serial Number: 144309

## 2016-06-07 ENCOUNTER — Other Ambulatory Visit: Payer: Self-pay

## 2016-06-07 MED ORDER — DABIGATRAN ETEXILATE MESYLATE 75 MG PO CAPS: ORAL_CAPSULE | ORAL | 4 refills | Status: DC

## 2016-06-20 DIAGNOSIS — I10 Essential (primary) hypertension: Secondary | ICD-10-CM | POA: Diagnosis not present

## 2016-06-20 DIAGNOSIS — E782 Mixed hyperlipidemia: Secondary | ICD-10-CM | POA: Diagnosis not present

## 2016-06-24 ENCOUNTER — Other Ambulatory Visit: Payer: Self-pay | Admitting: *Deleted

## 2016-06-24 DIAGNOSIS — I480 Paroxysmal atrial fibrillation: Secondary | ICD-10-CM

## 2016-06-24 DIAGNOSIS — Z950 Presence of cardiac pacemaker: Secondary | ICD-10-CM

## 2016-06-24 MED ORDER — METOPROLOL SUCCINATE ER 50 MG PO TB24: 50.0000 mg | ORAL_TABLET | Freq: Every day | ORAL | 6 refills | Status: DC

## 2016-06-25 ENCOUNTER — Other Ambulatory Visit: Payer: Self-pay

## 2016-06-25 ENCOUNTER — Telehealth: Payer: Self-pay | Admitting: Cardiovascular Disease

## 2016-06-25 DIAGNOSIS — Z950 Presence of cardiac pacemaker: Secondary | ICD-10-CM

## 2016-06-25 DIAGNOSIS — I480 Paroxysmal atrial fibrillation: Secondary | ICD-10-CM

## 2016-06-25 MED ORDER — METOPROLOL SUCCINATE ER 50 MG PO TB24: 50.0000 mg | ORAL_TABLET | Freq: Every day | ORAL | 1 refills | Status: DC

## 2016-06-25 NOTE — Telephone Encounter (Signed)
Pharmacy changed as requested.

## 2016-06-25 NOTE — Telephone Encounter (Signed)
New message    Pt is calling to let us know she has switched pharmacies. [facility]- [facility] [address] of [address].

## 2016-06-27 DIAGNOSIS — E782 Mixed hyperlipidemia: Secondary | ICD-10-CM | POA: Diagnosis not present

## 2016-06-27 DIAGNOSIS — E875 Hyperkalemia: Secondary | ICD-10-CM | POA: Diagnosis not present

## 2016-06-27 DIAGNOSIS — I480 Paroxysmal atrial fibrillation: Secondary | ICD-10-CM | POA: Diagnosis not present

## 2016-06-27 DIAGNOSIS — R252 Cramp and spasm: Secondary | ICD-10-CM | POA: Diagnosis not present

## 2016-07-28 ENCOUNTER — Emergency Department (HOSPITAL_COMMUNITY)
Admission: EM | Admit: 2016-07-28 | Discharge: 2016-07-28 | Disposition: A | Discharge status: Home / Self Care | Payer: Medicare Other | Attending: Emergency Medicine | Admitting: Emergency Medicine

## 2016-07-28 ENCOUNTER — Emergency Department (HOSPITAL_COMMUNITY): Payer: Medicare Other

## 2016-07-28 DIAGNOSIS — I110 Hypertensive heart disease with heart failure: Secondary | ICD-10-CM | POA: Diagnosis not present

## 2016-07-28 DIAGNOSIS — Z85828 Personal history of other malignant neoplasm of skin: Secondary | ICD-10-CM | POA: Insufficient documentation

## 2016-07-28 DIAGNOSIS — I2510 Atherosclerotic heart disease of native coronary artery without angina pectoris: Secondary | ICD-10-CM | POA: Insufficient documentation

## 2016-07-28 DIAGNOSIS — J449 Chronic obstructive pulmonary disease, unspecified: Secondary | ICD-10-CM | POA: Insufficient documentation

## 2016-07-28 DIAGNOSIS — I509 Heart failure, unspecified: Secondary | ICD-10-CM | POA: Insufficient documentation

## 2016-07-28 DIAGNOSIS — R079 Chest pain, unspecified: Secondary | ICD-10-CM | POA: Diagnosis not present

## 2016-07-28 DIAGNOSIS — Z950 Presence of cardiac pacemaker: Secondary | ICD-10-CM | POA: Diagnosis not present

## 2016-07-28 DIAGNOSIS — Z79899 Other long term (current) drug therapy: Secondary | ICD-10-CM | POA: Diagnosis not present

## 2016-07-28 DIAGNOSIS — R0789 Other chest pain: Secondary | ICD-10-CM | POA: Insufficient documentation

## 2016-07-28 DIAGNOSIS — Z87891 Personal history of nicotine dependence: Secondary | ICD-10-CM | POA: Insufficient documentation

## 2016-07-28 LAB — CBC
HCT: 46.5 % — ABNORMAL HIGH (ref 36.0–46.0)
Hemoglobin: 15.6 g/dL — ABNORMAL HIGH (ref 12.0–15.0)
MCH: 32.1 pg (ref 26.0–34.0)
MCHC: 33.5 g/dL (ref 30.0–36.0)
MCV: 95.7 fL (ref 78.0–100.0)
PLATELETS: 140 10*3/uL — AB (ref 150–400)
RBC: 4.86 MIL/uL (ref 3.87–5.11)
RDW: 12.8 % (ref 11.5–15.5)
WBC: 8.5 10*3/uL (ref 4.0–10.5)

## 2016-07-28 LAB — BASIC METABOLIC PANEL
ANION GAP: 8 (ref 5–15)
BUN: 20 mg/dL (ref 6–20)
CALCIUM: 9.2 mg/dL (ref 8.9–10.3)
CO2: 23 mmol/L (ref 22–32)
CREATININE: 0.96 mg/dL (ref 0.44–1.00)
Chloride: 103 mmol/L (ref 101–111)
GFR calc Af Amer: 59 mL/min — ABNORMAL LOW (ref >60)
GFR calc non Af Amer: 51 mL/min — ABNORMAL LOW (ref >60)
GLUCOSE: 87 mg/dL (ref 65–99)
Potassium: 4.3 mmol/L (ref 3.5–5.1)
Sodium: 134 mmol/L — ABNORMAL LOW (ref 135–145)

## 2016-07-28 LAB — I-STAT TROPONIN, ED: TROPONIN I, POC: 0.01 ng/mL (ref 0.00–0.08)

## 2016-07-28 LAB — PROTIME-INR
INR: 1.28
PROTHROMBIN TIME: 16 s — AB (ref 11.4–15.2)

## 2016-07-28 LAB — TROPONIN I: Troponin I: <0.03 ng/mL (ref <0.03)

## 2016-07-28 NOTE — ED Triage Notes (Addendum)
PT reported she had at pressure pain to chest that radiated to back. Pt also  Had a HA. PT reported she has an appt.  With Cadiologist on WED. 07-31-16

## 2016-07-28 NOTE — Discharge Instructions (Signed)
Please read attached information. If you experience any new or worsening signs or symptoms please return to the emergency room for evaluation. Please follow-up with your primary care provider or specialist as discussed.  °
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
## 2016-07-28 NOTE — ED Triage Notes (Signed)
[employer], corporate down loaded to [employer] . Waiting for fax .

## 2016-07-31 ENCOUNTER — Ambulatory Visit (INDEPENDENT_AMBULATORY_CARE_PROVIDER_SITE_OTHER): Payer: Medicare Other | Admitting: Cardiovascular Disease

## 2016-07-31 VITALS — BP 150/96 | HR 76 | Ht 62.0 in | Wt 153.8 lb

## 2016-07-31 DIAGNOSIS — I714 Abdominal aortic aneurysm, without rupture, unspecified: Secondary | ICD-10-CM

## 2016-07-31 DIAGNOSIS — Z7901 Long term (current) use of anticoagulants: Secondary | ICD-10-CM

## 2016-07-31 DIAGNOSIS — I2510 Atherosclerotic heart disease of native coronary artery without angina pectoris: Secondary | ICD-10-CM

## 2016-07-31 DIAGNOSIS — I480 Paroxysmal atrial fibrillation: Secondary | ICD-10-CM

## 2016-07-31 DIAGNOSIS — E7800 Pure hypercholesterolemia, unspecified: Secondary | ICD-10-CM | POA: Diagnosis not present

## 2016-07-31 DIAGNOSIS — Z950 Presence of cardiac pacemaker: Secondary | ICD-10-CM | POA: Diagnosis not present

## 2016-07-31 DIAGNOSIS — I10 Essential (primary) hypertension: Secondary | ICD-10-CM

## 2016-07-31 DIAGNOSIS — I442 Atrioventricular block, complete: Secondary | ICD-10-CM

## 2016-07-31 DIAGNOSIS — E875 Hyperkalemia: Secondary | ICD-10-CM | POA: Diagnosis not present

## 2016-07-31 LAB — CUP PACEART INCLINIC DEVICE CHECK
Date Time Interrogation Session: 20180321040000
Implantable Lead Implant Date: 20131211
Implantable Lead Location: 753859
Implantable Lead Location: 753860
Implantable Lead Model: 4135
Lead Channel Impedance Value: 647 Ohm
Lead Channel Pacing Threshold Pulse Width: 0.5 ms
Lead Channel Setting Sensing Sensitivity: 2.5 mV
MDC IDC LEAD IMPLANT DT: 20131211
MDC IDC LEAD SERIAL: 29296655
MDC IDC LEAD SERIAL: 29317536
MDC IDC MSMT LEADCHNL RA IMPEDANCE VALUE: 669 Ohm
MDC IDC MSMT LEADCHNL RV PACING THRESHOLD AMPLITUDE: 1 V
MDC IDC PG IMPLANT DT: 20131211
MDC IDC SET LEADCHNL RV PACING AMPLITUDE: 2.4 V
MDC IDC SET LEADCHNL RV PACING PULSEWIDTH: 0.5 ms
Pulse Gen Serial Number: 144309

## 2016-07-31 MED ORDER — METOPROLOL SUCCINATE ER 100 MG PO TB24: 100.0000 mg | ORAL_TABLET | Freq: Every day | ORAL | 3 refills | Status: DC

## 2016-07-31 NOTE — Patient Instructions (Signed)
Medication Instructions: Dr Sallyanne Kuster has recommended making the following medication changes: 1. INCREASE Metoprolol to 100 mg daily  Labwork: Your physician recommends that you return for lab work next week.  Testing/Procedures: NONE ORDERED  Follow-up: Dr Sallyanne Kuster recommends that you schedule a follow-up appointment in 3 months with a pacemaker check.  If you need a refill on your cardiac medications before your next appointment, please call your pharmacy.   STOP drinking V8!!  Low-Sodium Eating Plan Sodium, which is an element that makes up salt, helps you maintain a healthy balance of fluids in your body. Too much sodium can increase your blood pressure and cause fluid and waste to be held in your body. Your health care provider or dietitian may recommend following this plan if you have high blood pressure (hypertension), kidney disease, liver disease, or heart failure. Eating less sodium can help lower your blood pressure, reduce swelling, and protect your heart, liver, and kidneys. What are tips for following this plan? General guidelines   Most people on this plan should limit their sodium intake to 1,500-2,000 mg (milligrams) of sodium each day. Reading food labels   The Nutrition Facts label lists the amount of sodium in one serving of the food. If you eat more than one serving, you must multiply the listed amount of sodium by the number of servings.  Choose foods with less than 140 mg of sodium per serving.  Avoid foods with 300 mg of sodium or more per serving. Shopping   Look for lower-sodium products, often labeled as "low-sodium" or "no salt added."  Always check the sodium content even if foods are labeled as "unsalted" or "no salt added".  Buy fresh foods.  Avoid canned foods and premade or frozen meals.  Avoid canned, cured, or processed meats  Buy breads that have less than 80 mg of sodium per slice. Cooking   Eat more home-cooked food and less  restaurant, buffet, and fast food.  Avoid adding salt when cooking. Use salt-free seasonings or herbs instead of table salt or sea salt. Check with your health care provider or pharmacist before using salt substitutes.  Cook with plant-based oils, such as canola, sunflower, or olive oil. Meal planning   When eating at a restaurant, ask that your food be prepared with less salt or no salt, if possible.  Avoid foods that contain MSG (monosodium glutamate). MSG is sometimes added to [place] food, bouillon, and some canned foods. What foods are recommended? The items listed may not be a complete list. Talk with your dietitian about what dietary choices are best for you. Grains  Low-sodium cereals, including oats, puffed wheat and rice, and shredded wheat. Low-sodium crackers. Unsalted rice. Unsalted pasta. Low-sodium bread. Whole-grain breads and whole-grain pasta. Vegetables  Fresh or frozen vegetables. "No salt added" canned vegetables. "No salt added" tomato sauce and paste. Low-sodium or reduced-sodium tomato and vegetable juice. Fruits  Fresh, frozen, or canned fruit. Fruit juice. Meats and other protein foods  Fresh or frozen (no salt added) meat, poultry, seafood, and fish. Low-sodium canned tuna and salmon. Unsalted nuts. Dried peas, beans, and lentils without added salt. Unsalted canned beans. Eggs. Unsalted nut butters. Dairy  Milk. Soy milk. Cheese that is naturally low in sodium, such as ricotta cheese, fresh mozzarella, or Swiss cheese Low-sodium or reduced-sodium cheese. Cream cheese. Yogurt. Fats and oils  Unsalted butter. Unsalted margarine with no trans fat. Vegetable oils such as canola or olive oils. Seasonings and other foods  Fresh and dried  herbs and spices. Salt-free seasonings. Low-sodium mustard and ketchup. Sodium-free salad dressing. Sodium-free light mayonnaise. Fresh or refrigerated horseradish. Lemon juice. Vinegar. Homemade, reduced-sodium, or low-sodium soups.  Unsalted popcorn and pretzels. Low-salt or salt-free chips. What foods are not recommended? The items listed may not be a complete list. Talk with your dietitian about what dietary choices are best for you. Grains  Instant hot cereals. Bread stuffing, pancake, and biscuit mixes. Croutons. Seasoned rice or pasta mixes. Noodle soup cups. Boxed or frozen macaroni and cheese. Regular salted crackers. Self-rising flour. Vegetables  Sauerkraut, pickled vegetables, and relishes. Olives. [place] fries. Onion rings. Regular canned vegetables (not low-sodium or reduced-sodium). Regular canned tomato sauce and paste (not low-sodium or reduced-sodium). Regular tomato and vegetable juice (not low-sodium or reduced-sodium). Frozen vegetables in sauces. Meats and other protein foods  Meat or fish that is salted, canned, smoked, spiced, or pickled. Bacon, ham, sausage, hotdogs, corned beef, chipped beef, packaged lunch meats, salt pork, jerky, pickled herring, anchovies, regular canned tuna, sardines, salted nuts. Dairy  Processed cheese and cheese spreads. Cheese curds. Blue cheese. Feta cheese. String cheese. Regular cottage cheese. Buttermilk. Canned milk. Fats and oils  Salted butter. Regular margarine. Ghee. Bacon fat. Seasonings and other foods  Onion salt, garlic salt, seasoned salt, table salt, and sea salt. Canned and packaged gravies. Worcestershire sauce. Tartar sauce. Barbecue sauce. Teriyaki sauce. Soy sauce, including reduced-sodium. Steak sauce. Fish sauce. Oyster sauce. Cocktail sauce. Horseradish that you find on the shelf. Regular ketchup and mustard. Meat flavorings and tenderizers. Bouillon cubes. Hot sauce and Tabasco sauce. Premade or packaged marinades. Premade or packaged taco seasonings. Relishes. Regular salad dressings. Salsa. Potato and tortilla chips. Corn chips and puffs. Salted popcorn and pretzels. Canned or dried soups. Pizza. Frozen entrees and pot pies. Summary  Eating less sodium  can help lower your blood pressure, reduce swelling, and protect your heart, liver, and kidneys.  Most people on this plan should limit their sodium intake to 1,500-2,000 mg (milligrams) of sodium each day.  Canned, boxed, and frozen foods are high in sodium. Restaurant foods, fast foods, and pizza are also very high in sodium. You also get sodium by adding salt to food.  Try to cook at home, eat more fresh fruits and vegetables, and eat less fast food, canned, processed, or prepared foods. This information is not intended to replace advice given to you by your health care provider. Make sure you discuss any questions you have with your health care provider. Document Released: 10/19/2001 Document Revised: 04/22/2016 Document Reviewed: 04/22/2016 Elsevier Interactive Patient Education  2017 [employer].

## 2016-08-02 ENCOUNTER — Encounter: Payer: Self-pay | Admitting: Cardiovascular Disease
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
## 2016-08-07 DIAGNOSIS — E875 Hyperkalemia: Secondary | ICD-10-CM | POA: Diagnosis not present

## 2016-08-08 LAB — BASIC METABOLIC PANEL
BUN: 21 mg/dL (ref 7–25)
CALCIUM: 9.6 mg/dL (ref 8.6–10.4)
CO2: 25 mmol/L (ref 20–31)
CREATININE: 1.09 mg/dL — AB (ref 0.60–0.88)
Chloride: 106 mmol/L (ref 98–110)
GLUCOSE: 98 mg/dL (ref 65–99)
POTASSIUM: 5.6 mmol/L — AB (ref 3.5–5.3)
Sodium: 145 mmol/L (ref 135–146)

## 2016-08-08 LAB — MAGNESIUM: MAGNESIUM: 2.2 mg/dL (ref 1.5–2.5)

## 2016-08-12 ENCOUNTER — Telehealth: Payer: Self-pay

## 2016-08-12 DIAGNOSIS — Z79899 Other long term (current) drug therapy: Secondary | ICD-10-CM

## 2016-08-12 DIAGNOSIS — E875 Hyperkalemia: Secondary | ICD-10-CM

## 2016-08-12 NOTE — Telephone Encounter (Signed)
-----   Message from Sanda Klein, MD sent at 08/08/2016  8:24 AM EDT ----- Potassium is high. Not sure if true or lab error. Please stop the KCl supplement and recheck in 2 weeks.

## 2016-08-12 NOTE — Telephone Encounter (Signed)
Called patient with results. Patient verbalized understanding. Repeat lab ordered and mailed to patient.

## 2016-08-27 DIAGNOSIS — H353121 Nonexudative age-related macular degeneration, left eye, early dry stage: Secondary | ICD-10-CM | POA: Diagnosis not present

## 2016-08-27 DIAGNOSIS — Z961 Presence of intraocular lens: Secondary | ICD-10-CM | POA: Diagnosis not present

## 2016-08-27 DIAGNOSIS — H5213 Myopia, bilateral: Secondary | ICD-10-CM | POA: Diagnosis not present

## 2016-08-28 DIAGNOSIS — E875 Hyperkalemia: Secondary | ICD-10-CM | POA: Diagnosis not present

## 2016-08-28 DIAGNOSIS — Z79899 Other long term (current) drug therapy: Secondary | ICD-10-CM | POA: Diagnosis not present

## 2016-08-28 LAB — BASIC METABOLIC PANEL
BUN: 18 mg/dL (ref 7–25)
CALCIUM: 9.8 mg/dL (ref 8.6–10.4)
CO2: 21 mmol/L (ref 20–31)
CREATININE: 0.98 mg/dL — AB (ref 0.60–0.88)
Chloride: 105 mmol/L (ref 98–110)
GLUCOSE: 105 mg/dL — AB (ref 65–99)
Potassium: 5 mmol/L (ref 3.5–5.3)
Sodium: 144 mmol/L (ref 135–146)

## 2016-10-15 ENCOUNTER — Ambulatory Visit (INDEPENDENT_AMBULATORY_CARE_PROVIDER_SITE_OTHER): Payer: Medicare Other | Admitting: Orthopaedic Surgery

## 2016-10-15 ENCOUNTER — Ambulatory Visit (INDEPENDENT_AMBULATORY_CARE_PROVIDER_SITE_OTHER): Payer: Medicare Other

## 2016-10-15 ENCOUNTER — Encounter (INDEPENDENT_AMBULATORY_CARE_PROVIDER_SITE_OTHER): Payer: Self-pay | Admitting: Orthopaedic Surgery

## 2016-10-15 VITALS — BP 135/86 | HR 71 | Ht 63.0 in | Wt 158.0 lb

## 2016-10-15 DIAGNOSIS — M1712 Unilateral primary osteoarthritis, left knee: Secondary | ICD-10-CM

## 2016-10-15 DIAGNOSIS — G8929 Other chronic pain: Secondary | ICD-10-CM

## 2016-10-15 DIAGNOSIS — M11261 Other chondrocalcinosis, right knee: Secondary | ICD-10-CM | POA: Diagnosis not present

## 2016-10-15 DIAGNOSIS — I2510 Atherosclerotic heart disease of native coronary artery without angina pectoris: Secondary | ICD-10-CM | POA: Diagnosis not present

## 2016-10-15 DIAGNOSIS — M25561 Pain in right knee: Secondary | ICD-10-CM

## 2016-10-15 DIAGNOSIS — M25562 Pain in left knee: Secondary | ICD-10-CM

## 2016-10-15 DIAGNOSIS — M11262 Other chondrocalcinosis, left knee: Secondary | ICD-10-CM

## 2016-10-15 DIAGNOSIS — M1711 Unilateral primary osteoarthritis, right knee: Secondary | ICD-10-CM | POA: Diagnosis not present

## 2016-10-15 MED ORDER — LIDOCAINE HCL 1 % IJ SOLN
5.0000 mL | INTRAMUSCULAR | Status: AC | PRN
  Administered 2016-10-15: 5 mL

## 2016-10-15 MED ORDER — BUPIVACAINE HCL 0.5 % IJ SOLN
3.0000 mL | INTRAMUSCULAR | Status: AC | PRN
  Administered 2016-10-15: 3 mL via INTRA_ARTICULAR

## 2016-10-15 MED ORDER — METHYLPREDNISOLONE ACETATE 40 MG/ML IJ SUSP
80.0000 mg | INTRAMUSCULAR | Status: AC | PRN
  Administered 2016-10-15: 80 mg
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
## 2016-11-01 ENCOUNTER — Telehealth: Payer: Self-pay | Admitting: *Deleted

## 2016-11-01 NOTE — Telephone Encounter (Signed)
Please call patient and schedule Euflexxa x 3, Right knee, buy and bill, with Aaron Edelman on Wednesday's. Thank you.

## 2016-11-06 ENCOUNTER — Other Ambulatory Visit: Payer: Self-pay

## 2016-11-06 MED ORDER — DABIGATRAN ETEXILATE MESYLATE 75 MG PO CAPS: ORAL_CAPSULE | ORAL | 4 refills | Status: DC

## 2016-11-20 ENCOUNTER — Encounter: Payer: Self-pay | Admitting: Cardiovascular Disease

## 2016-11-20 ENCOUNTER — Ambulatory Visit (INDEPENDENT_AMBULATORY_CARE_PROVIDER_SITE_OTHER): Payer: Medicare Other | Admitting: Cardiovascular Disease

## 2016-11-20 ENCOUNTER — Ambulatory Visit (INDEPENDENT_AMBULATORY_CARE_PROVIDER_SITE_OTHER): Payer: Medicare Other | Admitting: Orthopedic Surgery

## 2016-11-20 VITALS — BP 140/88 | HR 73 | Ht 62.0 in | Wt 151.5 lb

## 2016-11-20 VITALS — BP 139/84 | HR 70 | Ht 62.0 in | Wt 150.0 lb

## 2016-11-20 DIAGNOSIS — Z7901 Long term (current) use of anticoagulants: Secondary | ICD-10-CM | POA: Diagnosis not present

## 2016-11-20 DIAGNOSIS — I482 Chronic atrial fibrillation, unspecified: Secondary | ICD-10-CM

## 2016-11-20 DIAGNOSIS — E7800 Pure hypercholesterolemia, unspecified: Secondary | ICD-10-CM | POA: Diagnosis not present

## 2016-11-20 DIAGNOSIS — I714 Abdominal aortic aneurysm, without rupture, unspecified: Secondary | ICD-10-CM

## 2016-11-20 DIAGNOSIS — I442 Atrioventricular block, complete: Secondary | ICD-10-CM

## 2016-11-20 DIAGNOSIS — M1711 Unilateral primary osteoarthritis, right knee: Secondary | ICD-10-CM | POA: Diagnosis not present

## 2016-11-20 DIAGNOSIS — I150 Renovascular hypertension: Secondary | ICD-10-CM

## 2016-11-20 DIAGNOSIS — I481 Persistent atrial fibrillation: Secondary | ICD-10-CM | POA: Diagnosis not present

## 2016-11-20 DIAGNOSIS — Z950 Presence of cardiac pacemaker: Secondary | ICD-10-CM

## 2016-11-20 DIAGNOSIS — I2510 Atherosclerotic heart disease of native coronary artery without angina pectoris: Secondary | ICD-10-CM

## 2016-11-20 MED ORDER — LIDOCAINE HCL 2 % IJ SOLN
2.0000 mL | INTRAMUSCULAR | Status: AC | PRN
  Administered 2016-11-20: 2 mL

## 2016-11-20 MED ORDER — SODIUM HYALURONATE (VISCOSUP) 20 MG/2ML IX SOSY
20.0000 mg | PREFILLED_SYRINGE | INTRA_ARTICULAR | Status: AC | PRN
  Administered 2016-11-20: 20 mg via INTRA_ARTICULAR
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
## 2016-11-20 NOTE — Patient Instructions (Signed)
Medication Instructions: Your physician recommends that you continue on your current medications as directed. Please refer to the Current Medication list given to you today.  Follow-Up: We request that you follow-up in: 3 months with home pacemaker check and in  6 months with Dr Sallyanne Kuster.  You will receive a reminder letter in the mail two months in advance. If you don't receive a letter, please call our office to schedule the follow-up appointment.  Remote monitoring is used to monitor your Pacemaker of ICD from home. This monitoring reduces the number of office visits required to check your device to one time per year. It allows [place] to keep an eye on the functioning of your device to ensure it is working properly. You are scheduled for a device check from home on February 19, 2017. You may send your transmission at any time that day. If you have a wireless device, the transmission will be sent automatically. After your physician reviews your transmission, you will receive a postcard with your next transmission date.  If you need a refill on your cardiac medications before your next appointment, please call your pharmacy.

## 2016-11-27 ENCOUNTER — Ambulatory Visit (INDEPENDENT_AMBULATORY_CARE_PROVIDER_SITE_OTHER): Payer: Medicare Other | Admitting: Orthopedic Surgery

## 2016-11-27 ENCOUNTER — Other Ambulatory Visit: Payer: Self-pay | Admitting: Cardiovascular Disease

## 2016-11-27 DIAGNOSIS — M1711 Unilateral primary osteoarthritis, right knee: Secondary | ICD-10-CM | POA: Diagnosis not present

## 2016-11-27 MED ORDER — LIDOCAINE HCL 2 % IJ SOLN
2.0000 mL | INTRAMUSCULAR | Status: AC | PRN
  Administered 2016-11-27: 2 mL

## 2016-11-27 MED ORDER — SODIUM HYALURONATE (VISCOSUP) 20 MG/2ML IX SOSY
20.0000 mg | PREFILLED_SYRINGE | INTRA_ARTICULAR | Status: AC | PRN
  Administered 2016-11-27: 20 mg via INTRA_ARTICULAR
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
## 2016-12-04 ENCOUNTER — Ambulatory Visit (INDEPENDENT_AMBULATORY_CARE_PROVIDER_SITE_OTHER): Payer: Medicare Other | Admitting: Orthopedic Surgery

## 2016-12-04 DIAGNOSIS — M1711 Unilateral primary osteoarthritis, right knee: Secondary | ICD-10-CM | POA: Diagnosis not present

## 2016-12-04 MED ORDER — LIDOCAINE HCL 2 % IJ SOLN
2.0000 mL | INTRAMUSCULAR | Status: AC | PRN
  Administered 2016-12-04: 2 mL

## 2016-12-04 MED ORDER — SODIUM HYALURONATE (VISCOSUP) 20 MG/2ML IX SOSY
20.0000 mg | PREFILLED_SYRINGE | INTRA_ARTICULAR | Status: AC | PRN
  Administered 2016-12-04: 20 mg via INTRA_ARTICULAR
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
## 2016-12-05 ENCOUNTER — Other Ambulatory Visit: Payer: Self-pay

## 2016-12-05 MED ORDER — FUROSEMIDE 40 MG PO TABS: 20.0000 mg | ORAL_TABLET | Freq: Every day | ORAL | 11 refills | Status: DC

## 2016-12-28 ENCOUNTER — Encounter (HOSPITAL_COMMUNITY): Payer: Self-pay

## 2016-12-28 ENCOUNTER — Emergency Department (HOSPITAL_COMMUNITY)
Admission: EM | Admit: 2016-12-28 | Discharge: 2016-12-28 | Disposition: A | Discharge status: Home / Self Care | Payer: Medicare Other | Attending: Emergency Medicine | Admitting: Emergency Medicine

## 2016-12-28 ENCOUNTER — Emergency Department (HOSPITAL_COMMUNITY): Payer: Medicare Other

## 2016-12-28 DIAGNOSIS — Z85840 Personal history of malignant neoplasm of eye: Secondary | ICD-10-CM | POA: Diagnosis not present

## 2016-12-28 DIAGNOSIS — Z79899 Other long term (current) drug therapy: Secondary | ICD-10-CM | POA: Diagnosis not present

## 2016-12-28 DIAGNOSIS — R1013 Epigastric pain: Secondary | ICD-10-CM

## 2016-12-28 DIAGNOSIS — Z87891 Personal history of nicotine dependence: Secondary | ICD-10-CM | POA: Diagnosis not present

## 2016-12-28 DIAGNOSIS — J449 Chronic obstructive pulmonary disease, unspecified: Secondary | ICD-10-CM | POA: Insufficient documentation

## 2016-12-28 DIAGNOSIS — I714 Abdominal aortic aneurysm, without rupture: Secondary | ICD-10-CM | POA: Diagnosis not present

## 2016-12-28 DIAGNOSIS — I509 Heart failure, unspecified: Secondary | ICD-10-CM | POA: Insufficient documentation

## 2016-12-28 DIAGNOSIS — R110 Nausea: Secondary | ICD-10-CM | POA: Diagnosis not present

## 2016-12-28 DIAGNOSIS — Z950 Presence of cardiac pacemaker: Secondary | ICD-10-CM | POA: Diagnosis not present

## 2016-12-28 DIAGNOSIS — I110 Hypertensive heart disease with heart failure: Secondary | ICD-10-CM | POA: Diagnosis not present

## 2016-12-28 DIAGNOSIS — R109 Unspecified abdominal pain: Secondary | ICD-10-CM | POA: Diagnosis present

## 2016-12-28 DIAGNOSIS — K2970 Gastritis, unspecified, without bleeding: Secondary | ICD-10-CM | POA: Diagnosis not present

## 2016-12-28 LAB — URINALYSIS, ROUTINE W REFLEX MICROSCOPIC
BILIRUBIN URINE: NEGATIVE
Glucose, UA: NEGATIVE mg/dL
HGB URINE DIPSTICK: NEGATIVE
Ketones, ur: NEGATIVE mg/dL
Nitrite: NEGATIVE
PROTEIN: NEGATIVE mg/dL
Specific Gravity, Urine: 1.018 (ref 1.005–1.030)
pH: 5 (ref 5.0–8.0)

## 2016-12-28 LAB — COMPREHENSIVE METABOLIC PANEL
ALBUMIN: 3.7 g/dL (ref 3.5–5.0)
ALT: 52 U/L (ref 14–54)
AST: 142 U/L — AB (ref 15–41)
Alkaline Phosphatase: 155 U/L — ABNORMAL HIGH (ref 38–126)
Anion gap: 11 (ref 5–15)
BUN: 18 mg/dL (ref 6–20)
CHLORIDE: 105 mmol/L (ref 101–111)
CO2: 23 mmol/L (ref 22–32)
Calcium: 9.2 mg/dL (ref 8.9–10.3)
Creatinine, Ser: 1.01 mg/dL — ABNORMAL HIGH (ref 0.44–1.00)
GFR calc Af Amer: 55 mL/min — ABNORMAL LOW (ref >60)
GFR, EST NON AFRICAN AMERICAN: 48 mL/min — AB (ref >60)
Glucose, Bld: 114 mg/dL — ABNORMAL HIGH (ref 65–99)
POTASSIUM: 3.8 mmol/L (ref 3.5–5.1)
Sodium: 139 mmol/L (ref 135–145)
Total Bilirubin: 1.6 mg/dL — ABNORMAL HIGH (ref 0.3–1.2)
Total Protein: 6.6 g/dL (ref 6.5–8.1)

## 2016-12-28 LAB — CBC
HEMATOCRIT: 47.8 % — AB (ref 36.0–46.0)
Hemoglobin: 15.8 g/dL — ABNORMAL HIGH (ref 12.0–15.0)
MCH: 31.7 pg (ref 26.0–34.0)
MCHC: 33.1 g/dL (ref 30.0–36.0)
MCV: 96 fL (ref 78.0–100.0)
Platelets: 152 10*3/uL (ref 150–400)
RBC: 4.98 MIL/uL (ref 3.87–5.11)
RDW: 13.4 % (ref 11.5–15.5)
WBC: 7.8 10*3/uL (ref 4.0–10.5)

## 2016-12-28 LAB — LIPASE, BLOOD: Lipase: 42 U/L (ref 11–51)

## 2016-12-28 LAB — I-STAT TROPONIN, ED: TROPONIN I, POC: 0.01 ng/mL (ref 0.00–0.08)

## 2016-12-28 NOTE — Discharge Instructions (Signed)
Tonight your evaluated for epigastric abdominal pain.  Her labs are all within normal parameters.  Her ultrasound shows no pathology to explain your symptoms, your cardiac marker is negative.  Please call your physician on Monday to discuss today's visit and arrange follow-up at bedtime you develop worsening symptoms please return for further evaluation
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
## 2016-12-28 NOTE — ED Triage Notes (Signed)
Pt presents to the ED via [facility] from home for upper abd pain/epigastric pain x 45 min. Pt states it feels better when pressure is applied. Pt had similar episode 3-4 months ago but nothing was found to be the cause. Pt uses a walker at home. Pt A+Ox4, denies NVD. Vitals with EMS BP- 156/98, HR- 70, RR-16, O2- 96% RA. Pt states she has acid reflux and thinks this is just gas pains, and is in no apparent distress in triage. Pt has a pacemaker. Resp e/u, skin warm/dry.

## 2016-12-31 LAB — CUP PACEART INCLINIC DEVICE CHECK
Battery Remaining Longevity: 54 mo
Implantable Lead Implant Date: 20131211
Implantable Lead Model: 4135
Implantable Lead Serial Number: 29317536
Implantable Pulse Generator Implant Date: 20131211
Lead Channel Pacing Threshold Pulse Width: 0.5 ms
Lead Channel Setting Pacing Amplitude: 2.4 V
Lead Channel Setting Sensing Sensitivity: 2.5 mV
MDC IDC LEAD IMPLANT DT: 20131211
MDC IDC LEAD LOCATION: 753859
MDC IDC LEAD LOCATION: 753860
MDC IDC LEAD SERIAL: 29296655
MDC IDC MSMT LEADCHNL RV IMPEDANCE VALUE: 635 Ohm
MDC IDC MSMT LEADCHNL RV PACING THRESHOLD AMPLITUDE: 0.9 V
MDC IDC PG SERIAL: 144309
MDC IDC SESS DTM: 20180821121949
MDC IDC SET LEADCHNL RA PACING AMPLITUDE: 2 V
MDC IDC SET LEADCHNL RV PACING PULSEWIDTH: 0.5 ms
MDC IDC STAT BRADY RV PERCENT PACED: 99 %

## 2017-01-02 DIAGNOSIS — E782 Mixed hyperlipidemia: Secondary | ICD-10-CM | POA: Diagnosis not present

## 2017-01-02 DIAGNOSIS — Z0000 Encounter for general adult medical examination without abnormal findings: Secondary | ICD-10-CM | POA: Diagnosis not present

## 2017-01-02 DIAGNOSIS — N390 Urinary tract infection, site not specified: Secondary | ICD-10-CM | POA: Diagnosis not present

## 2017-01-02 DIAGNOSIS — I10 Essential (primary) hypertension: Secondary | ICD-10-CM | POA: Diagnosis not present

## 2017-01-02 DIAGNOSIS — I480 Paroxysmal atrial fibrillation: Secondary | ICD-10-CM | POA: Diagnosis not present

## 2017-01-09 DIAGNOSIS — I129 Hypertensive chronic kidney disease with stage 1 through stage 4 chronic kidney disease, or unspecified chronic kidney disease: Secondary | ICD-10-CM | POA: Diagnosis not present

## 2017-01-09 DIAGNOSIS — I480 Paroxysmal atrial fibrillation: Secondary | ICD-10-CM | POA: Diagnosis not present

## 2017-01-09 DIAGNOSIS — N183 Chronic kidney disease, stage 3 (moderate): Secondary | ICD-10-CM | POA: Diagnosis not present

## 2017-01-09 DIAGNOSIS — E782 Mixed hyperlipidemia: Secondary | ICD-10-CM | POA: Diagnosis not present

## 2017-01-30 ENCOUNTER — Telehealth: Payer: Self-pay

## 2017-01-30 DIAGNOSIS — I714 Abdominal aortic aneurysm, without rupture, unspecified: Secondary | ICD-10-CM

## 2017-01-30 NOTE — Telephone Encounter (Signed)
-----   Message from Sanda Klein, MD sent at 11/22/2016  2:51 PM EDT ----- She is due a AAA [place] scan please MCr

## 2017-01-30 NOTE — Telephone Encounter (Signed)
Patient notified. Message routed to vascular scheduler.

## 2017-02-04 ENCOUNTER — Other Ambulatory Visit: Payer: Self-pay

## 2017-02-04 MED ORDER — MAGNESIUM OXIDE 400 (241.3 MG) MG PO TABS: 400.0000 mg | ORAL_TABLET | Freq: Every day | ORAL | 4 refills | Status: DC

## 2017-02-17 DIAGNOSIS — Z23 Encounter for immunization: Secondary | ICD-10-CM | POA: Diagnosis not present

## 2017-02-19 ENCOUNTER — Ambulatory Visit (INDEPENDENT_AMBULATORY_CARE_PROVIDER_SITE_OTHER): Payer: Medicare Other | Admitting: *Deleted

## 2017-02-19 DIAGNOSIS — I442 Atrioventricular block, complete: Secondary | ICD-10-CM

## 2017-02-19 NOTE — Progress Notes (Signed)
Remote pacemaker transmission.   

## 2017-02-21 ENCOUNTER — Encounter: Payer: Self-pay | Admitting: Cardiology

## 2017-03-05 LAB — CUP PACEART REMOTE DEVICE CHECK
Battery Remaining Percentage: 82 %
Brady Statistic RV Percent Paced: 100 %
Date Time Interrogation Session: 20181010053100
Implantable Lead Implant Date: 20131211
Implantable Lead Location: 753859
Implantable Lead Model: 4136
Implantable Lead Serial Number: 29296655
Implantable Lead Serial Number: 29317536
Lead Channel Impedance Value: 626 Ohm
Lead Channel Impedance Value: 684 Ohm
Lead Channel Pacing Threshold Amplitude: 0.9 V
Lead Channel Pacing Threshold Pulse Width: 0.5 ms
Lead Channel Setting Pacing Amplitude: 2.4 V
Lead Channel Setting Pacing Pulse Width: 0.5 ms
MDC IDC LEAD IMPLANT DT: 20131211
MDC IDC LEAD LOCATION: 753860
MDC IDC MSMT BATTERY REMAINING LONGEVITY: 54 mo
MDC IDC PG IMPLANT DT: 20131211
MDC IDC SET LEADCHNL RV SENSING SENSITIVITY: 2.5 mV
MDC IDC STAT BRADY RA PERCENT PACED: 0 %
Pulse Gen Serial Number: 144309

## 2017-03-30 ENCOUNTER — Emergency Department (HOSPITAL_COMMUNITY)
Admission: EM | Admit: 2017-03-30 | Discharge: 2017-03-30 | Disposition: A | Discharge status: Home / Self Care | Payer: Medicare Other | Attending: Emergency Medicine | Admitting: Emergency Medicine

## 2017-03-30 ENCOUNTER — Encounter (HOSPITAL_COMMUNITY): Payer: Self-pay | Admitting: Emergency Medicine

## 2017-03-30 ENCOUNTER — Emergency Department (HOSPITAL_COMMUNITY): Payer: Medicare Other

## 2017-03-30 DIAGNOSIS — Z79899 Other long term (current) drug therapy: Secondary | ICD-10-CM | POA: Insufficient documentation

## 2017-03-30 DIAGNOSIS — J449 Chronic obstructive pulmonary disease, unspecified: Secondary | ICD-10-CM | POA: Diagnosis not present

## 2017-03-30 DIAGNOSIS — I10 Essential (primary) hypertension: Secondary | ICD-10-CM | POA: Insufficient documentation

## 2017-03-30 DIAGNOSIS — R109 Unspecified abdominal pain: Secondary | ICD-10-CM | POA: Diagnosis not present

## 2017-03-30 DIAGNOSIS — I2510 Atherosclerotic heart disease of native coronary artery without angina pectoris: Secondary | ICD-10-CM | POA: Insufficient documentation

## 2017-03-30 DIAGNOSIS — Z950 Presence of cardiac pacemaker: Secondary | ICD-10-CM | POA: Diagnosis not present

## 2017-03-30 DIAGNOSIS — Z87891 Personal history of nicotine dependence: Secondary | ICD-10-CM | POA: Insufficient documentation

## 2017-03-30 DIAGNOSIS — R1013 Epigastric pain: Secondary | ICD-10-CM | POA: Diagnosis not present

## 2017-03-30 LAB — I-STAT CHEM 8, ED
BUN: 19 mg/dL (ref 6–20)
CALCIUM ION: 1.28 mmol/L (ref 1.15–1.40)
CREATININE: 1 mg/dL (ref 0.44–1.00)
Chloride: 103 mmol/L (ref 101–111)
GLUCOSE: 126 mg/dL — AB (ref 65–99)
HCT: 56 % — ABNORMAL HIGH (ref 36.0–46.0)
HEMOGLOBIN: 19 g/dL — AB (ref 12.0–15.0)
Potassium: 4 mmol/L (ref 3.5–5.1)
Sodium: 142 mmol/L (ref 135–145)
TCO2: 23 mmol/L (ref 22–32)

## 2017-03-30 LAB — COMPREHENSIVE METABOLIC PANEL
ALK PHOS: 243 U/L — AB (ref 38–126)
ALT: 229 U/L — AB (ref 14–54)
ANION GAP: 12 (ref 5–15)
AST: 562 U/L — ABNORMAL HIGH (ref 15–41)
Albumin: 4.1 g/dL (ref 3.5–5.0)
BUN: 16 mg/dL (ref 6–20)
CALCIUM: 10 mg/dL (ref 8.9–10.3)
CO2: 24 mmol/L (ref 22–32)
CREATININE: 1.04 mg/dL — AB (ref 0.44–1.00)
Chloride: 103 mmol/L (ref 101–111)
GFR, EST AFRICAN AMERICAN: 54 mL/min — AB (ref >60)
GFR, EST NON AFRICAN AMERICAN: 46 mL/min — AB (ref >60)
Glucose, Bld: 125 mg/dL — ABNORMAL HIGH (ref 65–99)
Potassium: 3.9 mmol/L (ref 3.5–5.1)
Sodium: 139 mmol/L (ref 135–145)
TOTAL PROTEIN: 7.2 g/dL (ref 6.5–8.1)
Total Bilirubin: 2.9 mg/dL — ABNORMAL HIGH (ref 0.3–1.2)

## 2017-03-30 LAB — CBC WITH DIFFERENTIAL/PLATELET
Basophils Absolute: 0 10*3/uL (ref 0.0–0.1)
Basophils Relative: 0 %
Eosinophils Absolute: 0 10*3/uL (ref 0.0–0.7)
Eosinophils Relative: 0 %
HCT: 53.5 % — ABNORMAL HIGH (ref 36.0–46.0)
HEMOGLOBIN: 17.9 g/dL — AB (ref 12.0–15.0)
LYMPHS PCT: 20 %
Lymphs Abs: 2.1 10*3/uL (ref 0.7–4.0)
MCH: 32.5 pg (ref 26.0–34.0)
MCHC: 33.5 g/dL (ref 30.0–36.0)
MCV: 97.3 fL (ref 78.0–100.0)
MONO ABS: 0.2 10*3/uL (ref 0.1–1.0)
MONOS PCT: 2 %
NEUTROS ABS: 8.4 10*3/uL — AB (ref 1.7–7.7)
NEUTROS PCT: 78 %
Platelets: 141 10*3/uL — ABNORMAL LOW (ref 150–400)
RBC: 5.5 MIL/uL — AB (ref 3.87–5.11)
RDW: 13 % (ref 11.5–15.5)
WBC: 10.7 10*3/uL — ABNORMAL HIGH (ref 4.0–10.5)

## 2017-03-30 LAB — I-STAT TROPONIN, ED: TROPONIN I, POC: 0.01 ng/mL (ref 0.00–0.08)

## 2017-03-30 LAB — LIPASE, BLOOD: LIPASE: 41 U/L (ref 11–51)

## 2017-03-30 MED ORDER — MORPHINE SULFATE (PF) 4 MG/ML IV SOLN
2.0000 mg | Freq: Once | INTRAVENOUS | Status: DC
  Filled 2017-03-30: qty 1

## 2017-03-30 MED ORDER — ALUM & MAG HYDROXIDE-SIMETH 200-200-20 MG/5ML PO SUSP
15.0000 mL | Freq: Once | ORAL | Status: AC
  Administered 2017-03-30: 15 mL via ORAL
  Filled 2017-03-30: qty 30

## 2017-03-30 MED ORDER — IOPAMIDOL (ISOVUE-300) INJECTION 61%
INTRAVENOUS | Status: AC
  Administered 2017-03-30: 100 mL
  Filled 2017-03-30: qty 100

## 2017-03-30 MED ORDER — SODIUM CHLORIDE 0.9 % IV BOLUS (SEPSIS)
1000.0000 mL | Freq: Once | INTRAVENOUS | Status: AC
  Administered 2017-03-30: 1000 mL via INTRAVENOUS

## 2017-03-30 MED ORDER — ONDANSETRON HCL 4 MG/2ML IJ SOLN
4.0000 mg | Freq: Once | INTRAMUSCULAR | Status: AC
  Administered 2017-03-30: 4 mg via INTRAVENOUS
  Filled 2017-03-30: qty 2

## 2017-03-30 NOTE — ED Notes (Signed)
Patient c/o pain and "shaking" (while attempting to get an IV, patient frequently pulled arm back towards her

## 2017-03-30 NOTE — ED Notes (Signed)
Patient pulled out IV in right forearm. Patient also had maladjusted 24 ga in left forearm as well. Removed tegaderm; cleaned and replaced site

## 2017-03-30 NOTE — ED Notes (Signed)
Late entry: patient was offered morphine earlier but refused.

## 2017-03-30 NOTE — ED Triage Notes (Signed)
Per EMS pt from home where she lives with husband. Extensive GI history with gallstones.  Reports tarry stool at home today.   Approximately 1900 tonight experienced dull mid epigastric pain that increased to 10/10 sharp pain with persistent nausea and dry heaves. Pt was writhing in pain en route. Given a total of 125 mcg of Fentanyl en route.  Paced rhythm.  Hypertensive 180/112, 24 gauge in right forearm.

## 2017-03-30 NOTE — Discharge Instructions (Signed)
We need to have your aorta reimaged in a year.  Return for any sudden worsening symptoms.

## 2017-03-30 NOTE — ED Notes (Signed)
Patient refused to take maalox
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
## 2017-04-01 DIAGNOSIS — R17 Unspecified jaundice: Secondary | ICD-10-CM | POA: Diagnosis not present

## 2017-04-01 DIAGNOSIS — R945 Abnormal results of liver function studies: Secondary | ICD-10-CM | POA: Diagnosis not present

## 2017-04-01 DIAGNOSIS — R109 Unspecified abdominal pain: Secondary | ICD-10-CM | POA: Diagnosis not present

## 2017-04-07 ENCOUNTER — Other Ambulatory Visit: Payer: Self-pay

## 2017-04-07 ENCOUNTER — Ambulatory Visit (INDEPENDENT_AMBULATORY_CARE_PROVIDER_SITE_OTHER): Payer: Medicare Other | Admitting: Gastroenterology

## 2017-04-07 ENCOUNTER — Other Ambulatory Visit (INDEPENDENT_AMBULATORY_CARE_PROVIDER_SITE_OTHER): Payer: Medicare Other

## 2017-04-07 ENCOUNTER — Encounter: Payer: Self-pay | Admitting: Gastroenterology

## 2017-04-07 ENCOUNTER — Telehealth: Payer: Self-pay

## 2017-04-07 VITALS — BP 150/70 | HR 84 | Ht 62.0 in | Wt 149.0 lb

## 2017-04-07 DIAGNOSIS — I480 Paroxysmal atrial fibrillation: Secondary | ICD-10-CM | POA: Diagnosis not present

## 2017-04-07 DIAGNOSIS — Z7901 Long term (current) use of anticoagulants: Secondary | ICD-10-CM

## 2017-04-07 DIAGNOSIS — R945 Abnormal results of liver function studies: Secondary | ICD-10-CM | POA: Diagnosis not present

## 2017-04-07 DIAGNOSIS — I2510 Atherosclerotic heart disease of native coronary artery without angina pectoris: Secondary | ICD-10-CM

## 2017-04-07 DIAGNOSIS — G8929 Other chronic pain: Secondary | ICD-10-CM

## 2017-04-07 DIAGNOSIS — I442 Atrioventricular block, complete: Secondary | ICD-10-CM | POA: Diagnosis not present

## 2017-04-07 DIAGNOSIS — R1013 Epigastric pain: Secondary | ICD-10-CM

## 2017-04-07 DIAGNOSIS — K8050 Calculus of bile duct without cholangitis or cholecystitis without obstruction: Secondary | ICD-10-CM

## 2017-04-07 DIAGNOSIS — R7989 Other specified abnormal findings of blood chemistry: Secondary | ICD-10-CM

## 2017-04-07 LAB — HEPATIC FUNCTION PANEL
ALK PHOS: 169 U/L — AB (ref 39–117)
ALT: 44 U/L — AB (ref 0–35)
AST: 36 U/L (ref 0–37)
Albumin: 4 g/dL (ref 3.5–5.2)
BILIRUBIN TOTAL: 1.5 mg/dL — AB (ref 0.2–1.2)
Bilirubin, Direct: 0.6 mg/dL — ABNORMAL HIGH (ref 0.0–0.3)
Total Protein: 6.9 g/dL (ref 6.0–8.3)
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
## 2017-04-07 NOTE — Patient Instructions (Signed)
If you are age 81 or older, your body mass index should be between 23-30. Your Body mass index is 27.25 kg/m. If this is out of the aforementioned range listed, please consider follow up with your Primary Care Provider.  If you are age 55 or younger, your body mass index should be between 19-25. Your Body mass index is 27.25 kg/m. If this is out of the aformentioned range listed, please consider follow up with your Primary Care Provider.   You have been scheduled for an endoscopy. Please follow written instructions given to you at your visit today. If you use inhalers (even only as needed), please bring them with you on the day of your procedure.  Thank you for choosing [facility]  Dr Wilfrid Lund III
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
## 2017-04-07 NOTE — Telephone Encounter (Signed)
Message  Received: Today  Message Contents  Danis, Kirke Corin, MD  Elias Else, CMA      Previous Messages    ----- Message -----  From: Sanda Klein, MD  Sent: 04/07/2017 12:54 PM  To: Nelida Meuse III, MD   Should be OK to stop for 2 days. How long will you need her off anticoagulation afterwards?  Mihai   ----- Message -----  From: Doran Stabler, MD  Sent: 04/07/2017 12:33 PM  To: Sanda Klein, MD   Hey man,   This lady is coming to see me in clinic today and looks like she will need an ERCP for suspected bile duct stones. Need her off [place] for 2 days prior in case I need to extend the biliary sphincterotomy. You OK with that? I know it's short notice, so this may not get to you in time. We'll call your office as well.   [facility]

## 2017-04-08 ENCOUNTER — Encounter (HOSPITAL_COMMUNITY): Payer: Self-pay | Admitting: *Deleted

## 2017-04-08 ENCOUNTER — Other Ambulatory Visit: Payer: Self-pay

## 2017-04-08 NOTE — Progress Notes (Signed)
Anesthesia Chart Review: SAME DAY WORK-UP. ENDO CASE.  Patient is a 83 year old female scheduled for ERCP on 04/09/17 by Dr. Wilfrid Lund III. She was evaluated 04/07/17 for recurrent elevated LFTs (ED visit 03/30/17) with history of prior cholecystectomy. She has required previous ERCP (last in 2015) and at the time of the procedure was "noted to have had a previous sphincterotomy, a periampullary diverticulum, and then stones were removed after crushing them with the mechanical lithotripter." Dr. Loletha Carrow felt "patient almost certainly has choledocholithiasis with episodic symptoms. Fortunately, she has not had cholangitis with any of the episodes. Her son Merry Proud was present for the entire visit today, and we all had a long discussion about this situation.  It is only a matter of time before she has another episode, and I am concerned she is at risk for cholangitis when that occurs.  She needs an ERCP with stone extraction..."  History includes former smoker (quit '84), COPD, persistent afib (s/p ablation and cardioversion, with recurrence), complete heart block s/p PPM [employer] 04/22/12), valvular disease (very mild AS, mild MR, mild-mod TR '16), CAD, dyspnea, cardiac tamponade/pericarditis 01/2012, AAA (4.4 cm 03/2017), hepatic artery aneurysm (s/p coil embolization, [facility]), HTN, HLD, GERD, hiatal hernia, melanoma excision ("behind right eye"), fibromuscular dysplasia, cholecystectomy, tonsillectomy, left renal tumor (s/p radiofrequency ablation 09/23/08).   - PCP is Dr. Merrilee Seashore. - Cardiologist is Dr. Dani Gobble Crotioru. Last visit 11/20/16. She denied angina. She was "device dependent." Dual chamber PPM programmed to VVIR. Normal device function. EP cardiologist is Dr. Jolyn Nap.  Meds include Pradaxa (holding since 04/07/17 8 AM per Dr. Loletha Carrow), Lasix, magnesium oxide, Toprol-XL, fish oil, Zantac, Crestor.   EKG 03/30/17: Baseline artifact. V-paced. Artifact is new, but otherwise I think  tracing is similar to 07/31/16.  LHC 04/24/15 (done for high risk stress test 04/20/15 showing inferoseptal and apical ischemia): 1. 1st Mrg lesion, 70% stenosed. 2. Mid Cx to Dist Cx lesion, 50% stenosed. 3. Ost 2nd Diag lesion, 50% stenosed. 4. Prox RCA lesion, 55% stenosed. 5. Mid LAD to Dist LAD lesion, 35% stenosed. 6. The left ventricular systolic function is normal.   Moderate coronary disease with 50-70% first obtuse marginal 50-60% second diagonal ostial stenosis, 50% mid circumflex, an eccentric 50-60% proximal RCA. There is moderate to heavy calcification within the LAD and circumflex. There is also right coronary calcification.  Overall normal left ventricular systolic function with EF 55-60%  When compared to the prior angiogram, the first obtuse marginal lesion has progressed, but is not felt to represent an angiographically significant lesion. Recommendations:  Continue medical therapy  Atrial fibrillation on top of this substrate could potentially cause angina. Current anatomy did not reveal a basis to perform PCI.  Echo 07/08/14: Study Conclusions - Left ventricle: The cavity size was normal. Wall thickness was increased in a pattern of mild LVH. Systolic function was normal. The estimated ejection fraction was in the range of 55% to 60%. Doppler parameters are consistent with elevated ventricular end-diastolic filling pressure. - Aortic valve: There was very mild stenosis. There was mild regurgitation. Valve area (VTI): 2.27 cm^2. Valve area (Vmax): 2.13 cm^2. Valve area (Vmean): 1.92 cm^2. - Aorta: Dilated some shadowing artifact no obvious disection - Mitral valve: Calcified annulus. There was mild regurgitation. - Left atrium: The atrium was moderately to severely dilated. - Atrial septum: No defect or patent foramen ovale was identified. - Tricuspid valve: There was mild-moderate regurgitation.  CTA neck 11/30/15: IMPRESSION: - Calcified and  noncalcified plaque  aortic arch. 6 mm protrusion posterior aspect of the proximal descending thoracic aorta may represent result of ulceration. - Plaque right carotid bifurcation with 52% diameter stenosis. Ectatic internal carotid artery beyond this region. - Plaque left carotid bifurcation with 62% diameter stenosis. Ectatic left internal carotid artery beyond this region. Mild narrowing proximal left common carotid artery. - Moderate narrowing proximal right subclavian artery with poststenotic dilation. - Mild narrowing proximal left subclavian artery. - Mild narrowing proximal vertebral arteries without significant narrowing. Mild to moderate narrowing distal right vertebral artery and moderate narrowing distal left vertebral artery.  CT abd/pelvis 03/30/17: IMPRESSION: 1. No acute abnormality seen to explain the patient's symptoms. 2. Worsening aneurysmal dilatation of the infrarenal abdominal aorta, measuring up to 4.4 cm in AP dimension and 3.7 cm in transverse dimension, with associated partial thrombosis of the aneurysm sac but no significant luminal narrowing. Recommend followup by ultrasound in 1 year. This recommendation follows ACR consensus guidelines: White Paper of the ACR Incidental Findings Committee II on Vascular Findings. J Am Coll Radiol 2013; 10:789-794. 3. Scattered diverticulosis along the entirety of the colon, without evidence of diverticulitis. 4. Nonspecific 1.0 cm hypodensity at the left adrenal gland. 5. 2.7 cm left renal angiomyolipoma again noted. Scattered bilateral renal cysts seen. 6. Scattered coronary artery calcifications seen. 7. Enlarging cystic foci bilaterally along the lower thoracic spine may reflect perineural cysts or dilated nerve root sheaths. Aortic Atherosclerosis (ICD10-I70.0).  Renal artery U/S 07/08/14: Summary:  - Right and left renal arteries: Demonstrated patency with elevated velocities consistent with known FMD.  - Right  and left kidneys: Normal in size, symmetrical in shape with normal cortex and medulla with no abnormal echogenicity or hydronephrosis visualized.   CXR 07/28/16: IMPRESSION: No acute cardiopulmonary disease. Mild stable cardiomegaly. Stable minimal compression deformity over the upper thoracic spine.  Labs on 11.18/18 and 04/07/17 noted. Cr 1.00. Glucose 126. AST 36 (down from 562) and ALT 44 (down from 229). Total bili down to 1.5 from 2.9. H/H 17.9-19.0/53.5-56.0. PLT 141K.   Patient with cardiology follow-up within the past six months. Cath within the past 2 years. She does have a PPM, interrogation 02/19/17--notes indicate she is pacer dependent. Anesthesiologist to evaluate on the day of surgery.  George Hugh [facility] Short Stay Center/Anesthesiology Phone [phone] 04/08/2017 4:34 PM

## 2017-04-09 ENCOUNTER — Encounter (HOSPITAL_COMMUNITY)
Admission: RE | Disposition: A | Discharge status: Home / Self Care | Payer: Self-pay | Source: Ambulatory Visit | Attending: Gastroenterology

## 2017-04-09 ENCOUNTER — Encounter (HOSPITAL_COMMUNITY): Payer: Self-pay | Admitting: Certified Registered"

## 2017-04-09 ENCOUNTER — Other Ambulatory Visit: Payer: Self-pay | Admitting: Gastroenterology

## 2017-04-09 ENCOUNTER — Ambulatory Visit (HOSPITAL_COMMUNITY)
Admission: RE | Admit: 2017-04-09 | Discharge: 2017-04-09 | Disposition: A | Discharge status: Home / Self Care | Payer: Medicare Other | Source: Ambulatory Visit | Attending: Gastroenterology | Admitting: Gastroenterology

## 2017-04-09 ENCOUNTER — Ambulatory Visit (HOSPITAL_COMMUNITY): Payer: Medicare Other

## 2017-04-09 ENCOUNTER — Ambulatory Visit (HOSPITAL_COMMUNITY): Payer: Medicare Other | Admitting: Vascular Surgery

## 2017-04-09 ENCOUNTER — Telehealth: Payer: Self-pay | Admitting: Gastroenterology

## 2017-04-09 DIAGNOSIS — K5710 Diverticulosis of small intestine without perforation or abscess without bleeding: Secondary | ICD-10-CM | POA: Diagnosis not present

## 2017-04-09 DIAGNOSIS — R945 Abnormal results of liver function studies: Secondary | ICD-10-CM | POA: Diagnosis not present

## 2017-04-09 DIAGNOSIS — I10 Essential (primary) hypertension: Secondary | ICD-10-CM | POA: Diagnosis not present

## 2017-04-09 DIAGNOSIS — E785 Hyperlipidemia, unspecified: Secondary | ICD-10-CM | POA: Diagnosis not present

## 2017-04-09 DIAGNOSIS — I481 Persistent atrial fibrillation: Secondary | ICD-10-CM | POA: Diagnosis not present

## 2017-04-09 DIAGNOSIS — R748 Abnormal levels of other serum enzymes: Secondary | ICD-10-CM | POA: Diagnosis present

## 2017-04-09 DIAGNOSIS — K222 Esophageal obstruction: Secondary | ICD-10-CM | POA: Diagnosis not present

## 2017-04-09 DIAGNOSIS — K8050 Calculus of bile duct without cholangitis or cholecystitis without obstruction: Secondary | ICD-10-CM

## 2017-04-09 DIAGNOSIS — Z85840 Personal history of malignant neoplasm of eye: Secondary | ICD-10-CM | POA: Insufficient documentation

## 2017-04-09 DIAGNOSIS — K219 Gastro-esophageal reflux disease without esophagitis: Secondary | ICD-10-CM | POA: Diagnosis not present

## 2017-04-09 DIAGNOSIS — Z79899 Other long term (current) drug therapy: Secondary | ICD-10-CM | POA: Insufficient documentation

## 2017-04-09 DIAGNOSIS — K831 Obstruction of bile duct: Secondary | ICD-10-CM

## 2017-04-09 DIAGNOSIS — Z7901 Long term (current) use of anticoagulants: Secondary | ICD-10-CM | POA: Diagnosis not present

## 2017-04-09 DIAGNOSIS — R1013 Epigastric pain: Secondary | ICD-10-CM | POA: Diagnosis not present

## 2017-04-09 DIAGNOSIS — I2510 Atherosclerotic heart disease of native coronary artery without angina pectoris: Secondary | ICD-10-CM | POA: Insufficient documentation

## 2017-04-09 DIAGNOSIS — Z950 Presence of cardiac pacemaker: Secondary | ICD-10-CM | POA: Insufficient documentation

## 2017-04-09 DIAGNOSIS — M4856XA Collapsed vertebra, not elsewhere classified, lumbar region, initial encounter for fracture: Secondary | ICD-10-CM | POA: Diagnosis not present

## 2017-04-09 DIAGNOSIS — J449 Chronic obstructive pulmonary disease, unspecified: Secondary | ICD-10-CM | POA: Diagnosis not present

## 2017-04-09 HISTORY — PX: ERCP: SHX5425

## 2017-04-09 SURGERY — ERCP, WITH INTERVENTION IF INDICATED
Anesthesia: General

## 2017-04-09 MED ORDER — DEXAMETHASONE SODIUM PHOSPHATE 10 MG/ML IJ SOLN
INTRAMUSCULAR | Status: DC | PRN
  Administered 2017-04-09: 5 mg via INTRAVENOUS

## 2017-04-09 MED ORDER — INDOMETHACIN 50 MG RE SUPP
RECTAL | Status: AC
  Filled 2017-04-09: qty 1

## 2017-04-09 MED ORDER — INDOMETHACIN 50 MG RE SUPP
RECTAL | Status: AC
  Filled 2017-04-09: qty 2

## 2017-04-09 MED ORDER — ACETAMINOPHEN-CODEINE #3 300-30 MG PO TABS: 1.0000 | ORAL_TABLET | Freq: Four times a day (QID) | ORAL | 0 refills | Status: DC | PRN

## 2017-04-09 MED ORDER — PHENYLEPHRINE 40 MCG/ML (10ML) SYRINGE FOR IV PUSH (FOR BLOOD PRESSURE SUPPORT)
PREFILLED_SYRINGE | INTRAVENOUS | Status: DC | PRN
  Administered 2017-04-09 (×2): 80 ug via INTRAVENOUS
  Administered 2017-04-09: 120 ug via INTRAVENOUS
  Administered 2017-04-09: 40 ug via INTRAVENOUS

## 2017-04-09 MED ORDER — ROCURONIUM BROMIDE 10 MG/ML (PF) SYRINGE
PREFILLED_SYRINGE | INTRAVENOUS | Status: DC | PRN
  Administered 2017-04-09: 40 mg via INTRAVENOUS

## 2017-04-09 MED ORDER — MEPERIDINE HCL 100 MG/ML IJ SOLN: 6.2500 mg | INTRAMUSCULAR | Status: DC | PRN

## 2017-04-09 MED ORDER — PROPOFOL 10 MG/ML IV BOLUS
INTRAVENOUS | Status: DC | PRN
  Administered 2017-04-09: 100 mg via INTRAVENOUS

## 2017-04-09 MED ORDER — LIDOCAINE 2% (20 MG/ML) 5 ML SYRINGE
INTRAMUSCULAR | Status: DC | PRN
  Administered 2017-04-09: 60 mg via INTRAVENOUS

## 2017-04-09 MED ORDER — FENTANYL CITRATE (PF) 100 MCG/2ML IJ SOLN: 25.0000 ug | INTRAMUSCULAR | Status: DC | PRN

## 2017-04-09 MED ORDER — LACTATED RINGERS IV SOLN
INTRAVENOUS | Status: DC
  Administered 2017-04-09 (×2): via INTRAVENOUS

## 2017-04-09 MED ORDER — ONDANSETRON HCL 4 MG/2ML IJ SOLN
INTRAMUSCULAR | Status: DC | PRN
  Administered 2017-04-09: 4 mg via INTRAVENOUS

## 2017-04-09 MED ORDER — SUGAMMADEX SODIUM 200 MG/2ML IV SOLN
INTRAVENOUS | Status: DC | PRN
  Administered 2017-04-09: 100 mg via INTRAVENOUS

## 2017-04-09 MED ORDER — FENTANYL CITRATE (PF) 100 MCG/2ML IJ SOLN
INTRAMUSCULAR | Status: DC | PRN
  Administered 2017-04-09: 100 ug via INTRAVENOUS

## 2017-04-09 MED ORDER — SODIUM CHLORIDE 0.9 % IV SOLN: INTRAVENOUS | Status: DC

## 2017-04-09 MED ORDER — GLUCAGON HCL RDNA (DIAGNOSTIC) 1 MG IJ SOLR
INTRAMUSCULAR | Status: AC
Start: 2017-04-09 — End: ?
  Filled 2017-04-09: qty 1

## 2017-04-09 MED ORDER — GLUCAGON HCL RDNA (DIAGNOSTIC) 1 MG IJ SOLR
INTRAMUSCULAR | Status: DC | PRN
  Administered 2017-04-09 (×2): .5 mg via INTRAVENOUS

## 2017-04-09 MED ORDER — AMPICILLIN-SULBACTAM SODIUM 3 (2-1) G IJ SOLR
3.0000 g | Freq: Once | INTRAMUSCULAR | Status: AC
  Administered 2017-04-09: 3 g via INTRAVENOUS
  Filled 2017-04-09 (×2): qty 3

## 2017-04-09 MED ORDER — IOPAMIDOL (ISOVUE-300) INJECTION 61%
INTRAVENOUS | Status: AC
  Filled 2017-04-09: qty 50

## 2017-04-09 MED ORDER — GLUCAGON HCL RDNA (DIAGNOSTIC) 1 MG IJ SOLR
INTRAMUSCULAR | Status: AC
  Filled 2017-04-09: qty 1

## 2017-04-09 NOTE — Discharge Instructions (Signed)
YOU HAD AN ENDOSCOPIC PROCEDURE TODAY: Refer to the procedure report and other information in the discharge instructions given to you for any specific questions about what was found during the examination. If this information does not answer your questions, please call [place] office at [phone] to clarify.  ° °YOU SHOULD EXPECT: Some feelings of bloating in the abdomen. Passage of more gas than usual. Walking can help get rid of the air that was put into your GI tract during the procedure and reduce the bloating. If you had a lower endoscopy (such as a colonoscopy or flexible sigmoidoscopy) you may notice spotting of blood in your stool or on the toilet paper. Some abdominal soreness may be present for a day or two, also. ° °DIET: Your first meal following the procedure should be a light meal and then it is ok to progress to your normal diet. A half-sandwich or bowl of soup is an example of a good first meal. Heavy or fried foods are harder to digest and may make you feel nauseous or bloated. Drink plenty of fluids but you should avoid alcoholic beverages for 24 hours. If you had a esophageal dilation, please see attached instructions for diet.   ° °ACTIVITY: Your care partner should take you home directly after the procedure. You should plan to take it easy, moving slowly for the rest of the day. You can resume normal activity the day after the procedure however YOU SHOULD NOT DRIVE, use power tools, machinery or perform tasks that involve climbing or major physical exertion for 24 hours (because of the sedation medicines used during the test).  ° °SYMPTOMS TO REPORT IMMEDIATELY: °A gastroenterologist can be reached at any hour. Please call [phone]  for any of the following symptoms:  °Following lower endoscopy (colonoscopy, flexible sigmoidoscopy) °Excessive amounts of blood in the stool  °Significant tenderness, worsening of abdominal pains  °Swelling of the abdomen that is new, acute  °Fever of 100° or  higher  °Following upper endoscopy (EGD, EUS, ERCP, esophageal dilation) °Vomiting of blood or coffee ground material  °New, significant abdominal pain  °New, significant chest pain or pain under the shoulder blades  °Painful or persistently difficult swallowing  °New shortness of breath  °Black, tarry-looking or red, bloody stools ° °FOLLOW UP:  °If any biopsies were taken you will be contacted by phone or by letter within the next 1-3 weeks. Call [phone]  if you have not heard about the biopsies in 3 weeks.  °Please also call with any specific questions about appointments or follow up tests. ° °

## 2017-04-09 NOTE — Op Note (Signed)
[facility] Patient Name: [name] Procedure Date : 04/09/2017 MRN: [id number] Attending MD: Estill Cotta. Loletha Carrow , MD Date of Birth: 1926/04/01 CSN: [id number] Age: 83 Admit Type: Outpatient Procedure:                ERCP Indications:              Epigastric abdominal pain, Elevated liver enzymes,                            history of common bile duct stones Providers:                Mallie Mussel L. Loletha Carrow, MD, Zenon Mayo, RN, Elspeth Cho Tech., Technician, Claybon Jabs CRNA, CRNA Referring MD:              Medicines:                General Anesthesia, Unasyn 3 g IV, glucagon 1.0 mg                            IV Complications:            No immediate complications. Estimated Blood Loss:     Estimated blood loss: none. Procedure:                Pre-Anesthesia Assessment:                           - Prior to the procedure, a History and Physical                            was performed, and patient medications and                            allergies were reviewed. The patient's tolerance of                            previous anesthesia was also reviewed. The risks                            and benefits of the procedure and the sedation                            options and risks were discussed with the patient.                            All questions were answered, and informed consent                            was obtained. Prior Anticoagulants: The patient has                            taken Pradaxa (dabigatran), last dose was 2 days  prior to procedure. ASA Grade Assessment: III - A                            patient with severe systemic disease. After                            reviewing the risks and benefits, the patient was                            deemed in satisfactory condition to undergo the                            procedure.                           After obtaining informed consent, the scope was                             passed under direct vision. Throughout the                            procedure, the patient's blood pressure, pulse, and                            oxygen saturations were monitored continuously. The                            [id number] scope was introduced through the                            mouth, and used to inject contrast into without                            successful cannulation. The [id number] ([id number])                            scope was introduced through the and used to inject                            contrast into. The ERCP was extremely difficult due                            to challenging cannulation because of                            peridiverticular papilla and challenging                            cannulation because of inability to visualize the                            major papilla. The patient tolerated the procedure  well. Scope In: Scope Out: Findings:      A scout film of the abdomen was obtained. Surgical clips, consistent       with a previous cholecystectomy, were seen in the area of the right       upper quadrant of the abdomen. A standard esophagogastroduodenoscopy       scope was used for the examination of the upper gastrointestinal tract.       The scope was passed under direct vision through the upper GI tract. One       mild, benign-appearing, intrinsic stenosis was found and traversed. A       large-mouthed diverticulum was found in the area of the papilla.      The scope was removed, and the duodenoscope inserted through the mouth       and advanced to the second portion of the duodenum.As had been described       on a previous ERCP report from 2015, the patient has a diverticulum in       the area of the papilla. It had been reported that the papilla was on       the proximal wall of the duodenal diverticulum. The entire area of the       diverticulum and surrounding area was  probed extensively with the       sphincterotome over the course of 2 hours. Although copious bile was       seen coming out of the diverticulum, the exact location of the papilla       could not be located. The procedure was ultimately aborted. Impression:               - Benign-appearing esophageal stenosis. Moderate Sedation:      GETA Recommendation:           - Resume Pradaxa (dabigatran) at prior dose today.                           - The patient will be referred to an academic                            medical center for advanced biliary endoscopy                            attempt at ERCP and stone extraction. Procedure Code(s):        --- Professional ---                           [phone], Esophagogastroduodenoscopy, flexible,                            transoral; diagnostic, including collection of                            specimen(s) by brushing or washing, when performed                            (separate procedure) Diagnosis Code(s):        --- Professional ---                           K22.2,  Esophageal obstruction CPT copyright 2016 American Medical Association. All rights reserved. The codes documented in this report are preliminary and upon coder review may  be revised to meet current compliance requirements. Tremaine Earwood L. Loletha Carrow, MD 04/09/2017 3:52:01 PM This report has been signed electronically. Number of Addenda: 0

## 2017-04-09 NOTE — Anesthesia Procedure Notes (Signed)
Procedure Name: Intubation Date/Time: 04/09/2017 1:21 PM Performed by: Moshe Salisbury, CRNA Pre-anesthesia Checklist: Patient identified, Emergency Drugs available, Suction available and Patient being monitored Patient Re-evaluated:Patient Re-evaluated prior to induction Oxygen Delivery Method: Circle System Utilized Preoxygenation: Pre-oxygenation with 100% oxygen Induction Type: IV induction Ventilation: Mask ventilation without difficulty Laryngoscope Size: Mac and 3 Grade View: Grade I Tube type: Oral Tube size: 7.0 mm Number of attempts: 1 Airway Equipment and Method: Stylet and Oral airway Placement Confirmation: ETT inserted through vocal cords under direct vision,  positive ETCO2 and breath sounds checked- equal and bilateral Secured at: 20 cm Tube secured with: Tape Dental Injury: Teeth and Oropharynx as per pre-operative assessment

## 2017-04-09 NOTE — Telephone Encounter (Signed)
This patient had an unsuccessful ERCP for CBD stones today.   She needs a referral sent today (Thursday, when you get this) to [facility], Dr. Tillie Rung.  I spoke to her fellow this evening, and he will help make the arrangements. He believes they will be able to do a clinic visit and ERCP on the same day sometime next week.  They will contact her directly with the instructions.  She will have to stop her pradaxa 2 days prior to procedure just like this time, and they will giver those instructions as well.  Please call the patient so she is aware.  The fellow was able to access most of our records through [facility], but does need the patient's most recent cardiology office note faxed along with the referral.  Dr. Joellyn Haff scheduler is name Eustaquio Maize, and her number is [phone]  If needed, their nurses are [place] at [phone] and [place] at [phone]  Let me know if any questions.  - HD

## 2017-04-09 NOTE — Anesthesia Postprocedure Evaluation (Signed)
Anesthesia Post Note  Patient: [name]  Procedure(s) Performed: ENDOSCOPIC RETROGRADE CHOLANGIOPANCREATOGRAPHY (ERCP) (N/A )     Patient location during evaluation: PACU Anesthesia Type: General Level of consciousness: awake and alert Pain management: pain level controlled Vital Signs Assessment: post-procedure vital signs reviewed and stable Respiratory status: spontaneous breathing, nonlabored ventilation and respiratory function stable Cardiovascular status: blood pressure returned to baseline and stable Postop Assessment: no apparent nausea or vomiting Anesthetic complications: no    Last Vitals:  Vitals:   04/09/17 1610 04/09/17 1620  BP: (!) 174/100 (!) 144/115  Pulse: 70 70  Resp: 20 18  Temp:    SpO2: 98% 99%    Last Pain:  Vitals:   04/09/17 1141  TempSrc: Oral                 Audry Pili

## 2017-04-09 NOTE — H&P (Signed)
See Office consult from 03/28/17 as H+P

## 2017-04-09 NOTE — Interval H&P Note (Signed)
History and Physical Interval Note:  04/09/2017 12:55 PM  [name]  has presented today for surgery, with the diagnosis of CBD STONE  The various methods of treatment have been discussed with the patient and family. After consideration of risks, benefits and other options for treatment, the patient has consented to  Procedure(s): ENDOSCOPIC RETROGRADE CHOLANGIOPANCREATOGRAPHY (ERCP) (N/A) as a surgical intervention .  The patient's history has been reviewed, patient examined, no change in status, stable for surgery.  I have reviewed the patient's chart and labs.  Questions were answered to the patient's satisfaction.     Nelida Meuse III

## 2017-04-09 NOTE — Transfer of Care (Signed)
Immediate Anesthesia Transfer of Care Note  Patient: [name]  Procedure(s) Performed: ENDOSCOPIC RETROGRADE CHOLANGIOPANCREATOGRAPHY (ERCP) (N/A )  Patient Location: Endoscopy Unit  Anesthesia Type:General  Level of Consciousness: awake and patient cooperative  Airway & Oxygen Therapy: Patient Spontanous Breathing and Patient connected to nasal cannula oxygen  Post-op Assessment: Report given to RN, Post -op Vital signs reviewed and stable and Patient moving all extremities  Post vital signs: Reviewed and stable  Last Vitals:  Vitals:   04/09/17 1141  BP: (!) 182/90  Pulse: 70  Resp: (!) 22  Temp: 36.7 C  SpO2: 95%    Last Pain:  Vitals:   04/09/17 1141  TempSrc: Oral         Complications: No apparent anesthesia complications

## 2017-04-09 NOTE — Anesthesia Preprocedure Evaluation (Signed)
Anesthesia Evaluation  Patient identified by MRN, date of birth, ID band Patient awake    Reviewed: Allergy & Precautions, H&P , NPO status , Patient's Chart, lab work & pertinent test results, reviewed documented beta blocker date and time   History of Anesthesia Complications Negative for: history of anesthetic complications  Airway Mallampati: II  TM Distance: >3 FB Neck ROM: Full    Dental  (+) Edentulous Upper, Partial Lower, Dental Advisory Given   Pulmonary COPD,  COPD inhaler, former smoker,    Pulmonary exam normal breath sounds clear to auscultation       Cardiovascular hypertension, Pt. on medications and Pt. on home beta blockers + CAD (non-obstructive by cath)  Normal cardiovascular exam+ dysrhythmias (pradaxa) Atrial Fibrillation + pacemaker + Valvular Problems/Murmurs  Rhythm:Irregular Rate:Normal  '12 Stress: normal perfusion and LVF, no ischemia '12 ECHO: EF 60-65%, valves OK   Neuro/Psych TIA   GI/Hepatic GERD  Medicated,Elevated LFTs   Endo/Other  negative endocrine ROS  Renal/GU negative Renal ROS     Musculoskeletal   Abdominal Normal abdominal exam  (+)   Peds  Hematology negative hematology ROS (+)   Anesthesia Other Findings   Reproductive/Obstetrics                             Anesthesia Physical  Anesthesia Plan  ASA: III  Anesthesia Plan: General   Post-op Pain Management:    Induction: Intravenous  PONV Risk Score and Plan: 2 and Treatment may vary due to age or medical condition  Airway Management Planned: Oral ETT  Additional Equipment:   Intra-op Plan:   Post-operative Plan: Extubation in OR  Informed Consent: I have reviewed the patients History and Physical, chart, labs and discussed the procedure including the risks, benefits and alternatives for the proposed anesthesia with the patient or authorized representative who has indicated his/her  understanding and acceptance.   Dental advisory given  Plan Discussed with: CRNA and Surgeon  Anesthesia Plan Comments: (Present for induction, easy mask, BBS=, +ETCO2 after atraum intubation)        Anesthesia Quick Evaluation

## 2017-04-10 ENCOUNTER — Ambulatory Visit (HOSPITAL_BASED_OUTPATIENT_CLINIC_OR_DEPARTMENT_OTHER)
Admission: RE | Admit: 2017-04-10 | Discharge: 2017-04-10 | Disposition: A | Discharge status: Home / Self Care | Payer: Medicare Other | Source: Ambulatory Visit | Attending: Cardiovascular Disease | Admitting: Cardiovascular Disease

## 2017-04-10 ENCOUNTER — Encounter (HOSPITAL_COMMUNITY): Payer: Self-pay | Admitting: Gastroenterology

## 2017-04-10 DIAGNOSIS — I714 Abdominal aortic aneurysm, without rupture, unspecified: Secondary | ICD-10-CM

## 2017-04-10 DIAGNOSIS — E785 Hyperlipidemia, unspecified: Secondary | ICD-10-CM | POA: Diagnosis not present

## 2017-04-10 DIAGNOSIS — K5710 Diverticulosis of small intestine without perforation or abscess without bleeding: Secondary | ICD-10-CM | POA: Diagnosis not present

## 2017-04-10 DIAGNOSIS — K219 Gastro-esophageal reflux disease without esophagitis: Secondary | ICD-10-CM | POA: Diagnosis not present

## 2017-04-10 DIAGNOSIS — K222 Esophageal obstruction: Secondary | ICD-10-CM | POA: Diagnosis not present

## 2017-04-10 DIAGNOSIS — I2510 Atherosclerotic heart disease of native coronary artery without angina pectoris: Secondary | ICD-10-CM | POA: Diagnosis not present

## 2017-04-10 DIAGNOSIS — I10 Essential (primary) hypertension: Secondary | ICD-10-CM | POA: Diagnosis not present

## 2017-04-10 NOTE — Telephone Encounter (Signed)
Faxed referral information to [phone] & [phone]. Spoke to [place] in [employer], [phone]. [name] is aware of scheduling need and is working on this with Dr. Joellyn Haff fellow. They will contact patient with appointment information, asked that if possible to have clinic visit and ERCP on same day. I spoke to patient's husband this morning, let him know to expect a call from them and they would give directions on Pradaxa. Also discussed the need for [facility] scheduling nurse to give directions on holding the blood thinner. I have asked that they let us know when this procedure is scheduled.

## 2017-04-11 ENCOUNTER — Other Ambulatory Visit: Payer: Self-pay | Admitting: *Deleted

## 2017-04-11 DIAGNOSIS — I714 Abdominal aortic aneurysm, without rupture, unspecified: Secondary | ICD-10-CM

## 2017-04-14 ENCOUNTER — Encounter (INDEPENDENT_AMBULATORY_CARE_PROVIDER_SITE_OTHER): Payer: Self-pay | Admitting: Orthopaedic Surgery

## 2017-04-14 ENCOUNTER — Other Ambulatory Visit (INDEPENDENT_AMBULATORY_CARE_PROVIDER_SITE_OTHER): Payer: Self-pay

## 2017-04-14 ENCOUNTER — Ambulatory Visit (INDEPENDENT_AMBULATORY_CARE_PROVIDER_SITE_OTHER): Payer: Medicare Other | Admitting: Orthopaedic Surgery

## 2017-04-14 ENCOUNTER — Other Ambulatory Visit: Payer: Self-pay | Admitting: Cardiovascular Disease

## 2017-04-14 ENCOUNTER — Ambulatory Visit (INDEPENDENT_AMBULATORY_CARE_PROVIDER_SITE_OTHER): Payer: Medicare Other

## 2017-04-14 VITALS — BP 125/80 | HR 79 | Ht 62.0 in | Wt 150.0 lb

## 2017-04-14 DIAGNOSIS — M545 Low back pain, unspecified: Secondary | ICD-10-CM

## 2017-04-14 DIAGNOSIS — M5440 Lumbago with sciatica, unspecified side: Secondary | ICD-10-CM

## 2017-04-14 DIAGNOSIS — I2510 Atherosclerotic heart disease of native coronary artery without angina pectoris: Secondary | ICD-10-CM | POA: Diagnosis not present

## 2017-04-14 DIAGNOSIS — I714 Abdominal aortic aneurysm, without rupture, unspecified: Secondary | ICD-10-CM

## 2017-04-14 DIAGNOSIS — M619 Calcification and ossification of muscle, unspecified: Secondary | ICD-10-CM

## 2017-04-14 DIAGNOSIS — K8050 Calculus of bile duct without cholangitis or cholecystitis without obstruction: Secondary | ICD-10-CM | POA: Diagnosis not present
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
## 2017-04-18 ENCOUNTER — Other Ambulatory Visit: Payer: Medicare Other

## 2017-04-18 DIAGNOSIS — R932 Abnormal findings on diagnostic imaging of liver and biliary tract: Secondary | ICD-10-CM | POA: Diagnosis not present

## 2017-04-18 DIAGNOSIS — Z8679 Personal history of other diseases of the circulatory system: Secondary | ICD-10-CM | POA: Diagnosis not present

## 2017-04-18 DIAGNOSIS — R748 Abnormal levels of other serum enzymes: Secondary | ICD-10-CM | POA: Diagnosis not present

## 2017-04-18 DIAGNOSIS — Z9889 Other specified postprocedural states: Secondary | ICD-10-CM | POA: Diagnosis not present

## 2017-04-18 DIAGNOSIS — K8050 Calculus of bile duct without cholangitis or cholecystitis without obstruction: Secondary | ICD-10-CM | POA: Diagnosis not present

## 2017-04-18 DIAGNOSIS — J449 Chronic obstructive pulmonary disease, unspecified: Secondary | ICD-10-CM | POA: Diagnosis not present

## 2017-04-18 DIAGNOSIS — I350 Nonrheumatic aortic (valve) stenosis: Secondary | ICD-10-CM | POA: Diagnosis not present

## 2017-04-18 DIAGNOSIS — J438 Other emphysema: Secondary | ICD-10-CM | POA: Diagnosis not present

## 2017-04-18 DIAGNOSIS — I2510 Atherosclerotic heart disease of native coronary artery without angina pectoris: Secondary | ICD-10-CM | POA: Diagnosis not present

## 2017-04-18 DIAGNOSIS — S32030A Wedge compression fracture of third lumbar vertebra, initial encounter for closed fracture: Secondary | ICD-10-CM | POA: Diagnosis not present

## 2017-04-18 DIAGNOSIS — R109 Unspecified abdominal pain: Secondary | ICD-10-CM | POA: Diagnosis not present

## 2017-04-18 DIAGNOSIS — K838 Other specified diseases of biliary tract: Secondary | ICD-10-CM | POA: Diagnosis not present

## 2017-04-19 DIAGNOSIS — K219 Gastro-esophageal reflux disease without esophagitis: Secondary | ICD-10-CM | POA: Diagnosis present

## 2017-04-19 DIAGNOSIS — J449 Chronic obstructive pulmonary disease, unspecified: Secondary | ICD-10-CM | POA: Diagnosis present

## 2017-04-19 DIAGNOSIS — I5032 Chronic diastolic (congestive) heart failure: Secondary | ICD-10-CM | POA: Diagnosis present

## 2017-04-19 DIAGNOSIS — I110 Hypertensive heart disease with heart failure: Secondary | ICD-10-CM | POA: Diagnosis present

## 2017-04-19 DIAGNOSIS — K91840 Postprocedural hemorrhage and hematoma of a digestive system organ or structure following a digestive system procedure: Secondary | ICD-10-CM | POA: Diagnosis not present

## 2017-04-19 DIAGNOSIS — N179 Acute kidney failure, unspecified: Secondary | ICD-10-CM | POA: Diagnosis present

## 2017-04-19 DIAGNOSIS — Z85820 Personal history of malignant melanoma of skin: Secondary | ICD-10-CM | POA: Diagnosis not present

## 2017-04-19 DIAGNOSIS — I340 Nonrheumatic mitral (valve) insufficiency: Secondary | ICD-10-CM | POA: Diagnosis present

## 2017-04-19 DIAGNOSIS — M810 Age-related osteoporosis without current pathological fracture: Secondary | ICD-10-CM | POA: Diagnosis present

## 2017-04-19 DIAGNOSIS — E785 Hyperlipidemia, unspecified: Secondary | ICD-10-CM | POA: Diagnosis present

## 2017-04-19 DIAGNOSIS — K922 Gastrointestinal hemorrhage, unspecified: Secondary | ICD-10-CM | POA: Diagnosis not present

## 2017-04-19 DIAGNOSIS — K859 Acute pancreatitis without necrosis or infection, unspecified: Secondary | ICD-10-CM | POA: Diagnosis not present

## 2017-04-19 DIAGNOSIS — Z950 Presence of cardiac pacemaker: Secondary | ICD-10-CM | POA: Diagnosis not present

## 2017-04-19 DIAGNOSIS — E860 Dehydration: Secondary | ICD-10-CM | POA: Diagnosis present

## 2017-04-19 DIAGNOSIS — G8918 Other acute postprocedural pain: Secondary | ICD-10-CM | POA: Diagnosis present

## 2017-04-19 DIAGNOSIS — M4856XA Collapsed vertebra, not elsewhere classified, lumbar region, initial encounter for fracture: Secondary | ICD-10-CM | POA: Diagnosis present

## 2017-04-19 DIAGNOSIS — Z9889 Other specified postprocedural states: Secondary | ICD-10-CM | POA: Diagnosis not present

## 2017-04-19 DIAGNOSIS — I350 Nonrheumatic aortic (valve) stenosis: Secondary | ICD-10-CM | POA: Diagnosis present

## 2017-04-19 DIAGNOSIS — Z87891 Personal history of nicotine dependence: Secondary | ICD-10-CM | POA: Diagnosis not present

## 2017-04-19 DIAGNOSIS — I481 Persistent atrial fibrillation: Secondary | ICD-10-CM | POA: Diagnosis present

## 2017-04-19 DIAGNOSIS — I361 Nonrheumatic tricuspid (valve) insufficiency: Secondary | ICD-10-CM | POA: Diagnosis present

## 2017-04-19 DIAGNOSIS — K8050 Calculus of bile duct without cholangitis or cholecystitis without obstruction: Secondary | ICD-10-CM | POA: Diagnosis present

## 2017-04-19 DIAGNOSIS — R945 Abnormal results of liver function studies: Secondary | ICD-10-CM | POA: Diagnosis not present

## 2017-04-19 DIAGNOSIS — S32030A Wedge compression fracture of third lumbar vertebra, initial encounter for closed fracture: Secondary | ICD-10-CM | POA: Diagnosis not present

## 2017-04-19 DIAGNOSIS — I2510 Atherosclerotic heart disease of native coronary artery without angina pectoris: Secondary | ICD-10-CM | POA: Diagnosis present

## 2017-04-21 ENCOUNTER — Other Ambulatory Visit: Payer: Medicare Other

## 2017-04-22 MED ORDER — PANTOPRAZOLE SODIUM 40 MG PO TBEC
40.00 | DELAYED_RELEASE_TABLET | ORAL | Status: DC
Start: 2017-04-22 — End: 2017-04-22

## 2017-04-22 MED ORDER — ACETAMINOPHEN 325 MG PO TABS
650.00 | ORAL_TABLET | ORAL | Status: DC
Start: ? — End: 2017-04-22

## 2017-04-22 MED ORDER — OXYCODONE HCL 5 MG PO TABS
2.50 | ORAL_TABLET | ORAL | Status: DC
Start: ? — End: 2017-04-22

## 2017-04-22 MED ORDER — DOCOSAHEXAENOIC ACID PO
1.00 | ORAL | Status: DC
Start: 2017-04-23 — End: 2017-04-22

## 2017-04-22 MED ORDER — FUROSEMIDE 40 MG PO TABS
40.00 | ORAL_TABLET | ORAL | Status: DC
Start: 2017-04-23 — End: 2017-04-22

## 2017-04-22 MED ORDER — RANITIDINE HCL 150 MG PO TABS
150.00 | ORAL_TABLET | ORAL | Status: DC
Start: 2017-04-23 — End: 2017-04-22

## 2017-04-22 MED ORDER — POLYVINYL ALCOHOL 1.4 % OP SOLN
1.00 | OPHTHALMIC | Status: DC
Start: ? — End: 2017-04-22

## 2017-04-22 MED ORDER — ROSUVASTATIN CALCIUM 5 MG PO TABS
5.00 | ORAL_TABLET | ORAL | Status: DC
Start: 2017-04-22 — End: 2017-04-22

## 2017-04-22 MED ORDER — MULTI-VITAMINS PO TABS
1.00 | ORAL_TABLET | ORAL | Status: DC
Start: 2017-04-23 — End: 2017-04-22

## 2017-04-22 MED ORDER — METOPROLOL TARTRATE 50 MG PO TABS
50.00 | ORAL_TABLET | ORAL | Status: DC
Start: 2017-04-22 — End: 2017-04-22

## 2017-04-23 ENCOUNTER — Ambulatory Visit
Admission: RE | Admit: 2017-04-23 | Discharge: 2017-04-23 | Disposition: A | Discharge status: Home / Self Care | Payer: Medicare Other | Source: Ambulatory Visit | Attending: Orthopaedic Surgery | Admitting: Orthopaedic Surgery

## 2017-04-23 DIAGNOSIS — K8080 Other cholelithiasis without obstruction: Secondary | ICD-10-CM | POA: Diagnosis not present

## 2017-04-23 DIAGNOSIS — M5440 Lumbago with sciatica, unspecified side: Secondary | ICD-10-CM

## 2017-04-23 DIAGNOSIS — I714 Abdominal aortic aneurysm, without rupture, unspecified: Secondary | ICD-10-CM

## 2017-04-23 DIAGNOSIS — S32030A Wedge compression fracture of third lumbar vertebra, initial encounter for closed fracture: Secondary | ICD-10-CM | POA: Diagnosis not present

## 2017-04-23 DIAGNOSIS — I481 Persistent atrial fibrillation: Secondary | ICD-10-CM | POA: Diagnosis not present

## 2017-04-24 ENCOUNTER — Other Ambulatory Visit: Payer: Self-pay

## 2017-04-24 ENCOUNTER — Telehealth: Payer: Self-pay | Admitting: Gastroenterology

## 2017-04-24 DIAGNOSIS — L570 Actinic keratosis: Secondary | ICD-10-CM | POA: Diagnosis not present

## 2017-04-24 DIAGNOSIS — K8080 Other cholelithiasis without obstruction: Secondary | ICD-10-CM | POA: Diagnosis not present

## 2017-04-24 DIAGNOSIS — D485 Neoplasm of uncertain behavior of skin: Secondary | ICD-10-CM | POA: Diagnosis not present

## 2017-04-24 DIAGNOSIS — C44622 Squamous cell carcinoma of skin of right upper limb, including shoulder: Secondary | ICD-10-CM | POA: Diagnosis not present

## 2017-04-24 DIAGNOSIS — K8050 Calculus of bile duct without cholangitis or cholecystitis without obstruction: Secondary | ICD-10-CM

## 2017-04-24 DIAGNOSIS — I481 Persistent atrial fibrillation: Secondary | ICD-10-CM | POA: Diagnosis not present

## 2017-04-24 NOTE — Telephone Encounter (Signed)
Spoke to patient. She understands to come by and have the Xray on Monday. She has a scheduled appointment that day with Dr Venetia Maxon. She will come by after that.

## 2017-04-24 NOTE — Telephone Encounter (Signed)
I received word from [place] that [name] had her ERCP, and they were able to remove the bile duct stones. They left a stent in the pancreatic duct, which will usually fall out all by itself within 7-10 days. A plain xray of the abdomen is needed next Monday to see if it is still present.  If still there, she would most likely need an upper endoscopy to remove it.  Please arrange a KUB for next Monday.  Dx: pancreatic stent placed during recent ERCP.  Evaluate for presence of stent.

## 2017-04-25 ENCOUNTER — Telehealth: Payer: Self-pay

## 2017-04-25 ENCOUNTER — Telehealth: Payer: Self-pay | Admitting: Internal Medicine

## 2017-04-25 DIAGNOSIS — I481 Persistent atrial fibrillation: Secondary | ICD-10-CM | POA: Diagnosis not present

## 2017-04-25 DIAGNOSIS — K8080 Other cholelithiasis without obstruction: Secondary | ICD-10-CM | POA: Diagnosis not present

## 2017-04-25 NOTE — Telephone Encounter (Signed)
error 

## 2017-04-25 NOTE — Telephone Encounter (Signed)
Don with [facility] OT would like orders for 1 x week for 3 weeks for ADL, transfer, and exercise.  Will DC when patient goals are met or maximum potential. CB# is [phone].  Please advise.

## 2017-04-25 NOTE — Telephone Encounter (Signed)
Please advise 

## 2017-04-28 ENCOUNTER — Other Ambulatory Visit (INDEPENDENT_AMBULATORY_CARE_PROVIDER_SITE_OTHER): Payer: Self-pay | Admitting: Orthopaedic Surgery

## 2017-04-28 ENCOUNTER — Ambulatory Visit (INDEPENDENT_AMBULATORY_CARE_PROVIDER_SITE_OTHER)
Admission: RE | Admit: 2017-04-28 | Discharge: 2017-04-28 | Disposition: A | Discharge status: Home / Self Care | Payer: Medicare Other | Source: Ambulatory Visit | Attending: Gastroenterology | Admitting: Gastroenterology

## 2017-04-28 ENCOUNTER — Ambulatory Visit (INDEPENDENT_AMBULATORY_CARE_PROVIDER_SITE_OTHER): Payer: Medicare Other | Admitting: Orthopaedic Surgery

## 2017-04-28 ENCOUNTER — Encounter (INDEPENDENT_AMBULATORY_CARE_PROVIDER_SITE_OTHER): Payer: Self-pay | Admitting: Orthopaedic Surgery

## 2017-04-28 ENCOUNTER — Other Ambulatory Visit (HOSPITAL_COMMUNITY): Payer: Self-pay | Admitting: Radiology

## 2017-04-28 ENCOUNTER — Other Ambulatory Visit: Payer: Medicare Other

## 2017-04-28 ENCOUNTER — Other Ambulatory Visit (INDEPENDENT_AMBULATORY_CARE_PROVIDER_SITE_OTHER): Payer: Self-pay | Admitting: *Deleted

## 2017-04-28 VITALS — BP 137/73 | HR 74 | Resp 16 | Ht 64.0 in | Wt 165.0 lb

## 2017-04-28 DIAGNOSIS — Z9689 Presence of other specified functional implants: Secondary | ICD-10-CM | POA: Diagnosis not present

## 2017-04-28 DIAGNOSIS — M545 Low back pain, unspecified: Secondary | ICD-10-CM

## 2017-04-28 DIAGNOSIS — I714 Abdominal aortic aneurysm, without rupture, unspecified: Secondary | ICD-10-CM

## 2017-04-28 DIAGNOSIS — I2510 Atherosclerotic heart disease of native coronary artery without angina pectoris: Secondary | ICD-10-CM | POA: Diagnosis not present

## 2017-04-28 DIAGNOSIS — K8050 Calculus of bile duct without cholangitis or cholecystitis without obstruction: Secondary | ICD-10-CM | POA: Diagnosis not present

## 2017-04-28 MED ORDER — HYDROCODONE-ACETAMINOPHEN 5-325 MG PO TABS: 1.0000 | ORAL_TABLET | Freq: Four times a day (QID) | ORAL | 0 refills | Status: DC | PRN
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
## 2017-04-28 NOTE — Telephone Encounter (Signed)
ok 

## 2017-04-29 ENCOUNTER — Telehealth (HOSPITAL_COMMUNITY): Payer: Self-pay

## 2017-04-29 DIAGNOSIS — I481 Persistent atrial fibrillation: Secondary | ICD-10-CM | POA: Diagnosis not present

## 2017-04-29 DIAGNOSIS — K8080 Other cholelithiasis without obstruction: Secondary | ICD-10-CM | POA: Diagnosis not present

## 2017-04-29 NOTE — Telephone Encounter (Signed)
Called and spoke with Timmothy Sours, [place] to do.

## 2017-04-29 NOTE — Telephone Encounter (Signed)
Please call and advise. Thank you. 

## 2017-04-29 NOTE — Telephone Encounter (Signed)
Called to schedule consult, left message for pt to return call. AW 

## 2017-04-30 DIAGNOSIS — I481 Persistent atrial fibrillation: Secondary | ICD-10-CM | POA: Diagnosis not present

## 2017-04-30 DIAGNOSIS — K8080 Other cholelithiasis without obstruction: Secondary | ICD-10-CM | POA: Diagnosis not present

## 2017-05-02 DIAGNOSIS — I481 Persistent atrial fibrillation: Secondary | ICD-10-CM | POA: Diagnosis not present

## 2017-05-02 DIAGNOSIS — K8080 Other cholelithiasis without obstruction: Secondary | ICD-10-CM | POA: Diagnosis not present

## 2017-05-05 DIAGNOSIS — I481 Persistent atrial fibrillation: Secondary | ICD-10-CM | POA: Diagnosis not present

## 2017-05-05 DIAGNOSIS — K8080 Other cholelithiasis without obstruction: Secondary | ICD-10-CM | POA: Diagnosis not present

## 2017-05-08 ENCOUNTER — Ambulatory Visit (HOSPITAL_COMMUNITY): Payer: Medicare Other

## 2017-05-09 ENCOUNTER — Ambulatory Visit (HOSPITAL_COMMUNITY)
Admission: RE | Admit: 2017-05-09 | Discharge: 2017-05-09 | Disposition: A | Discharge status: Home / Self Care | Payer: Medicare Other | Source: Ambulatory Visit | Attending: Orthopaedic Surgery | Admitting: Orthopaedic Surgery

## 2017-05-09 DIAGNOSIS — M545 Low back pain: Secondary | ICD-10-CM | POA: Diagnosis not present

## 2017-05-09 DIAGNOSIS — I481 Persistent atrial fibrillation: Secondary | ICD-10-CM | POA: Diagnosis not present

## 2017-05-09 DIAGNOSIS — K8080 Other cholelithiasis without obstruction: Secondary | ICD-10-CM | POA: Diagnosis not present

## 2017-05-09 DIAGNOSIS — M4856XA Collapsed vertebra, not elsewhere classified, lumbar region, initial encounter for fracture: Secondary | ICD-10-CM | POA: Diagnosis not present

## 2017-05-09 HISTORY — PX: IR RADIOLOGIST EVAL & MGMT: IMG5224

## 2017-05-12 ENCOUNTER — Encounter (HOSPITAL_COMMUNITY): Payer: Self-pay | Admitting: Interventional Radiology

## 2017-05-12 ENCOUNTER — Other Ambulatory Visit (HOSPITAL_COMMUNITY): Payer: Self-pay | Admitting: Interventional Radiology

## 2017-05-12 DIAGNOSIS — S32030A Wedge compression fracture of third lumbar vertebra, initial encounter for closed fracture: Secondary | ICD-10-CM

## 2017-05-12 DIAGNOSIS — M545 Low back pain, unspecified: Secondary | ICD-10-CM

## 2017-05-15 ENCOUNTER — Other Ambulatory Visit: Payer: Self-pay | Admitting: Radiology

## 2017-05-16 ENCOUNTER — Encounter (HOSPITAL_COMMUNITY): Payer: Self-pay

## 2017-05-16 ENCOUNTER — Ambulatory Visit (HOSPITAL_COMMUNITY)
Admission: RE | Admit: 2017-05-16 | Discharge: 2017-05-16 | Disposition: A | Discharge status: Home / Self Care | Payer: Medicare Other | Source: Ambulatory Visit | Attending: Interventional Radiology | Admitting: Interventional Radiology

## 2017-05-16 ENCOUNTER — Telehealth (INDEPENDENT_AMBULATORY_CARE_PROVIDER_SITE_OTHER): Payer: Self-pay | Admitting: Orthopaedic Surgery

## 2017-05-16 DIAGNOSIS — I442 Atrioventricular block, complete: Secondary | ICD-10-CM | POA: Diagnosis not present

## 2017-05-16 DIAGNOSIS — Z87891 Personal history of nicotine dependence: Secondary | ICD-10-CM | POA: Diagnosis not present

## 2017-05-16 DIAGNOSIS — I714 Abdominal aortic aneurysm, without rupture: Secondary | ICD-10-CM | POA: Insufficient documentation

## 2017-05-16 DIAGNOSIS — Z96652 Presence of left artificial knee joint: Secondary | ICD-10-CM | POA: Diagnosis not present

## 2017-05-16 DIAGNOSIS — Z950 Presence of cardiac pacemaker: Secondary | ICD-10-CM | POA: Insufficient documentation

## 2017-05-16 DIAGNOSIS — Z7901 Long term (current) use of anticoagulants: Secondary | ICD-10-CM | POA: Insufficient documentation

## 2017-05-16 DIAGNOSIS — Z85840 Personal history of malignant neoplasm of eye: Secondary | ICD-10-CM | POA: Diagnosis not present

## 2017-05-16 DIAGNOSIS — J449 Chronic obstructive pulmonary disease, unspecified: Secondary | ICD-10-CM | POA: Insufficient documentation

## 2017-05-16 DIAGNOSIS — I481 Persistent atrial fibrillation: Secondary | ICD-10-CM | POA: Insufficient documentation

## 2017-05-16 DIAGNOSIS — I10 Essential (primary) hypertension: Secondary | ICD-10-CM | POA: Insufficient documentation

## 2017-05-16 DIAGNOSIS — I480 Paroxysmal atrial fibrillation: Secondary | ICD-10-CM | POA: Diagnosis not present

## 2017-05-16 DIAGNOSIS — M545 Low back pain, unspecified: Secondary | ICD-10-CM

## 2017-05-16 DIAGNOSIS — S32030A Wedge compression fracture of third lumbar vertebra, initial encounter for closed fracture: Secondary | ICD-10-CM

## 2017-05-16 DIAGNOSIS — M4856XA Collapsed vertebra, not elsewhere classified, lumbar region, initial encounter for fracture: Secondary | ICD-10-CM | POA: Insufficient documentation

## 2017-05-16 HISTORY — PX: IR KYPHO LUMBAR INC FX REDUCE BONE BX UNI/BIL CANNULATION INC/IMAGING: IMG5519

## 2017-05-16 LAB — CBC
HEMATOCRIT: 40.1 % (ref 36.0–46.0)
HEMOGLOBIN: 13 g/dL (ref 12.0–15.0)
MCH: 31.9 pg (ref 26.0–34.0)
MCHC: 32.4 g/dL (ref 30.0–36.0)
MCV: 98.5 fL (ref 78.0–100.0)
Platelets: 138 10*3/uL — ABNORMAL LOW (ref 150–400)
RBC: 4.07 MIL/uL (ref 3.87–5.11)
RDW: 14.3 % (ref 11.5–15.5)
WBC: 6.6 10*3/uL (ref 4.0–10.5)

## 2017-05-16 LAB — PROTIME-INR
INR: 1.13
Prothrombin Time: 14.4 seconds (ref 11.4–15.2)

## 2017-05-16 LAB — BASIC METABOLIC PANEL
ANION GAP: 9 (ref 5–15)
BUN: 8 mg/dL (ref 6–20)
CO2: 24 mmol/L (ref 22–32)
Calcium: 9 mg/dL (ref 8.9–10.3)
Chloride: 102 mmol/L (ref 101–111)
Creatinine, Ser: 0.87 mg/dL (ref 0.44–1.00)
GFR calc Af Amer: >60 mL/min (ref >60)
GFR, EST NON AFRICAN AMERICAN: 57 mL/min — AB (ref >60)
GLUCOSE: 100 mg/dL — AB (ref 65–99)
POTASSIUM: 3.6 mmol/L (ref 3.5–5.1)
SODIUM: 135 mmol/L (ref 135–145)

## 2017-05-16 LAB — APTT: APTT: 26 s (ref 24–36)

## 2017-05-16 MED ORDER — CEFAZOLIN SODIUM-DEXTROSE 2-4 GM/100ML-% IV SOLN
2.0000 g | Freq: Once | INTRAVENOUS | Status: AC
  Administered 2017-05-16: 2 g via INTRAVENOUS

## 2017-05-16 MED ORDER — BUPIVACAINE HCL (PF) 0.5 % IJ SOLN
INTRAMUSCULAR | Status: AC
  Filled 2017-05-16: qty 30

## 2017-05-16 MED ORDER — MIDAZOLAM HCL 2 MG/2ML IJ SOLN
INTRAMUSCULAR | Status: AC | PRN
  Administered 2017-05-16: 1 mg via INTRAVENOUS
  Administered 2017-05-16 (×2): 0.5 mg via INTRAVENOUS

## 2017-05-16 MED ORDER — BUPIVACAINE HCL (PF) 0.25 % IJ SOLN
INTRAMUSCULAR | Status: AC | PRN
  Administered 2017-05-16: 20 mL

## 2017-05-16 MED ORDER — SODIUM CHLORIDE 0.9 % IV SOLN: INTRAVENOUS | Status: DC

## 2017-05-16 MED ORDER — SODIUM CHLORIDE 0.9 % IV SOLN
INTRAVENOUS | Status: DC
  Administered 2017-05-16: 12:00:00 via INTRAVENOUS

## 2017-05-16 MED ORDER — FENTANYL CITRATE (PF) 100 MCG/2ML IJ SOLN
INTRAMUSCULAR | Status: AC
  Filled 2017-05-16: qty 4

## 2017-05-16 MED ORDER — FENTANYL CITRATE (PF) 100 MCG/2ML IJ SOLN
INTRAMUSCULAR | Status: AC | PRN
  Administered 2017-05-16 (×3): 25 ug via INTRAVENOUS
  Administered 2017-05-16 (×2): 12.5 ug via INTRAVENOUS

## 2017-05-16 MED ORDER — IOPAMIDOL (ISOVUE-300) INJECTION 61%
INTRAVENOUS | Status: AC
  Filled 2017-05-16: qty 50

## 2017-05-16 MED ORDER — TOBRAMYCIN SULFATE 1.2 G IJ SOLR
INTRAMUSCULAR | Status: AC
  Administered 2017-05-16: 1.2 g
  Filled 2017-05-16: qty 1.2

## 2017-05-16 MED ORDER — MIDAZOLAM HCL 2 MG/2ML IJ SOLN
INTRAMUSCULAR | Status: AC
  Filled 2017-05-16: qty 4

## 2017-05-16 MED ORDER — CEFAZOLIN SODIUM-DEXTROSE 2-4 GM/100ML-% IV SOLN
INTRAVENOUS | Status: AC
  Filled 2017-05-16: qty 100

## 2017-05-16 NOTE — Sedation Documentation (Signed)
O2 increased to 4L/Blue Ball

## 2017-05-16 NOTE — Procedures (Signed)
S/P L3 Balloon KP

## 2017-05-16 NOTE — Sedation Documentation (Signed)
O2 decreasedto 2l

## 2017-05-16 NOTE — Sedation Documentation (Signed)
Patient is resting comfortably.  Tolerating well 

## 2017-05-16 NOTE — Sedation Documentation (Signed)
Dr Woodroe Chen explaining procedure to son andpt. Tolerated well.  VSS O2 off, no c/opain

## 2017-05-16 NOTE — Telephone Encounter (Signed)
Called Mel

## 2017-05-16 NOTE — Telephone Encounter (Signed)
Mel, physical therapist from [facility] called to get a verbal order for patient to continue physical therapy one time/week for 3 weeks.  CB# [phone]

## 2017-05-16 NOTE — Sedation Documentation (Signed)
O2 d/c'd 
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
## 2017-05-16 NOTE — Discharge Instructions (Signed)
1. No stooping,bending or lifting more than 10 lbs for 2 weeks. 2.Use Jocilyn Trego to ambulate for 2 weeks.No driving for 2 weeks 3.RTC PRN 2 weeks.   KYPHOPLASTY/VERTEBROPLASTY DISCHARGE INSTRUCTIONS  Medications: (check all that apply)     Resume all home medications as before procedure.       Resume your Pradaxa tomorrow           Continue your pain medications as prescribed as needed.  Over the next 3-5 days, decrease your pain medication as tolerated.  Over the counter medications (i.e. Tylenol, ibuprofen, and aleve) may be substituted once severe/moderate pain symptoms have subsided.   Wound Care: - Bandages may be removed the day following your procedure.  You may get your incision wet once bandages are removed.  Bandaids may be used to cover the incisions until scab formation.  Topical ointments are optional.  - If you develop a fever greater than 101 degrees, have increased skin redness at the incision sites or pus-like oozing from incisions occurring within 1 week of the procedure, contact radiology at [phone] or [phone].  - Ice pack to back for 15-20 minutes 2-3 time per day for first 2-3 days post procedure.  The ice will expedite muscle healing and help with the pain from the incisions.   Activity: - Bedrest today with limited activity for 24 hours post procedure.  - No driving for 48 hours.  - Increase your activity as tolerated after bedrest (with assistance if necessary).  - Refrain from any strenuous activity or heavy lifting (greater than 10 lbs.).   Follow up: - Contact radiology at [phone] or [phone] if any questions/concerns.  - A physician assistant from radiology will contact you in approximately 1 week.  - If a biopsy was performed at the time of your procedure, your referring physician should receive the results in usually 2-3 days.

## 2017-05-16 NOTE — Telephone Encounter (Signed)
OK to do-

## 2017-05-16 NOTE — Sedation Documentation (Signed)
O2 2l/[place] applied 

## 2017-05-16 NOTE — Telephone Encounter (Signed)
OK 

## 2017-05-17 DIAGNOSIS — I481 Persistent atrial fibrillation: Secondary | ICD-10-CM | POA: Diagnosis not present

## 2017-05-17 DIAGNOSIS — K8080 Other cholelithiasis without obstruction: Secondary | ICD-10-CM | POA: Diagnosis not present

## 2017-05-19 ENCOUNTER — Encounter (HOSPITAL_COMMUNITY): Payer: Self-pay | Admitting: Interventional Radiology

## 2017-05-20 DIAGNOSIS — R17 Unspecified jaundice: Secondary | ICD-10-CM | POA: Diagnosis not present

## 2017-05-20 DIAGNOSIS — R109 Unspecified abdominal pain: Secondary | ICD-10-CM | POA: Diagnosis not present

## 2017-05-21 ENCOUNTER — Telehealth (HOSPITAL_COMMUNITY): Payer: Self-pay

## 2017-05-21 ENCOUNTER — Ambulatory Visit (INDEPENDENT_AMBULATORY_CARE_PROVIDER_SITE_OTHER): Payer: Medicare Other | Admitting: *Deleted

## 2017-05-21 DIAGNOSIS — I442 Atrioventricular block, complete: Secondary | ICD-10-CM

## 2017-05-21 DIAGNOSIS — K8080 Other cholelithiasis without obstruction: Secondary | ICD-10-CM | POA: Diagnosis not present

## 2017-05-21 DIAGNOSIS — I481 Persistent atrial fibrillation: Secondary | ICD-10-CM | POA: Diagnosis not present

## 2017-05-21 NOTE — Progress Notes (Signed)
Remote pacemaker transmission.   

## 2017-05-21 NOTE — Telephone Encounter (Signed)
Called to check on pt since L3 KP. She stated that she was getting better. Not as well as she had hoped but a definite improvement. She is only taking Tylenol as needed. She will call to schedule a f/u if needed. AW

## 2017-05-22 ENCOUNTER — Encounter: Payer: Self-pay | Admitting: Cardiology

## 2017-05-23 ENCOUNTER — Other Ambulatory Visit: Payer: Self-pay | Admitting: Cardiovascular Disease

## 2017-05-23 LAB — CUP PACEART REMOTE DEVICE CHECK
Brady Statistic RV Percent Paced: 99 %
Implantable Lead Implant Date: 20131211
Implantable Lead Implant Date: 20131211
Implantable Lead Location: 753859
Implantable Lead Model: 4136
Implantable Lead Serial Number: 29317536
Implantable Pulse Generator Implant Date: 20131211
Lead Channel Impedance Value: 591 Ohm
Lead Channel Impedance Value: 628 Ohm
Lead Channel Pacing Threshold Pulse Width: 0.5 ms
Lead Channel Setting Pacing Amplitude: 2.4 V
Lead Channel Setting Sensing Sensitivity: 2.5 mV
MDC IDC LEAD LOCATION: 753860
MDC IDC LEAD SERIAL: 29296655
MDC IDC MSMT BATTERY REMAINING LONGEVITY: 48 mo
MDC IDC MSMT BATTERY REMAINING PERCENTAGE: 78 %
MDC IDC MSMT LEADCHNL RA PACING THRESHOLD AMPLITUDE: 0.9 V
MDC IDC SESS DTM: 20190109063100
MDC IDC SET LEADCHNL RV PACING PULSEWIDTH: 0.5 ms
MDC IDC STAT BRADY RA PERCENT PACED: 0 %
Pulse Gen Serial Number: 144309

## 2017-05-27 DIAGNOSIS — E782 Mixed hyperlipidemia: Secondary | ICD-10-CM | POA: Diagnosis not present

## 2017-05-27 DIAGNOSIS — I480 Paroxysmal atrial fibrillation: Secondary | ICD-10-CM | POA: Diagnosis not present

## 2017-05-27 DIAGNOSIS — I10 Essential (primary) hypertension: Secondary | ICD-10-CM | POA: Diagnosis not present

## 2017-05-28 DIAGNOSIS — K8080 Other cholelithiasis without obstruction: Secondary | ICD-10-CM | POA: Diagnosis not present

## 2017-05-28 DIAGNOSIS — I481 Persistent atrial fibrillation: Secondary | ICD-10-CM | POA: Diagnosis not present

## 2017-06-04 ENCOUNTER — Encounter: Payer: Self-pay | Admitting: Cardiovascular Disease

## 2017-06-04 ENCOUNTER — Ambulatory Visit (INDEPENDENT_AMBULATORY_CARE_PROVIDER_SITE_OTHER): Payer: Medicare Other | Admitting: Cardiovascular Disease

## 2017-06-04 VITALS — BP 144/84 | HR 74 | Ht 62.0 in | Wt 143.0 lb

## 2017-06-04 DIAGNOSIS — I4811 Longstanding persistent atrial fibrillation: Secondary | ICD-10-CM

## 2017-06-04 DIAGNOSIS — Z79899 Other long term (current) drug therapy: Secondary | ICD-10-CM | POA: Diagnosis not present

## 2017-06-04 DIAGNOSIS — I701 Atherosclerosis of renal artery: Secondary | ICD-10-CM | POA: Diagnosis not present

## 2017-06-04 DIAGNOSIS — I714 Abdominal aortic aneurysm, without rupture, unspecified: Secondary | ICD-10-CM

## 2017-06-04 DIAGNOSIS — E7800 Pure hypercholesterolemia, unspecified: Secondary | ICD-10-CM

## 2017-06-04 DIAGNOSIS — I2510 Atherosclerotic heart disease of native coronary artery without angina pectoris: Secondary | ICD-10-CM

## 2017-06-04 DIAGNOSIS — I481 Persistent atrial fibrillation: Secondary | ICD-10-CM | POA: Diagnosis not present

## 2017-06-04 DIAGNOSIS — Z7901 Long term (current) use of anticoagulants: Secondary | ICD-10-CM | POA: Diagnosis not present

## 2017-06-04 DIAGNOSIS — I350 Nonrheumatic aortic (valve) stenosis: Secondary | ICD-10-CM | POA: Diagnosis not present

## 2017-06-04 DIAGNOSIS — I5031 Acute diastolic (congestive) heart failure: Secondary | ICD-10-CM | POA: Diagnosis not present

## 2017-06-04 DIAGNOSIS — I442 Atrioventricular block, complete: Secondary | ICD-10-CM | POA: Diagnosis not present

## 2017-06-04 DIAGNOSIS — Z950 Presence of cardiac pacemaker: Secondary | ICD-10-CM | POA: Diagnosis not present

## 2017-06-04 MED ORDER — POTASSIUM CHLORIDE ER 10 MEQ PO TBCR: 10.0000 meq | EXTENDED_RELEASE_TABLET | Freq: Every day | ORAL | 3 refills | Status: DC

## 2017-06-04 MED ORDER — FUROSEMIDE 40 MG PO TABS: 40.0000 mg | ORAL_TABLET | Freq: Every day | ORAL | 3 refills | Status: DC
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
## 2017-06-04 NOTE — Patient Instructions (Signed)
Medication Instructions: Dr Sallyanne Kuster has recommended making the following medication changes: 1. INCREASE Furosemide to 40 mg daily 2. START Potassium 10 mEq - take 1 tablet by mouth daily  Labwork: Your physician recommends that you return for lab work in 2-3 weeks. Please go to the lab at least 1 day before your follow-up appointment with Kerin Ransom, PA.  Testing/Procedures: 1. Echocardiogram - Your physician has requested that you have an echocardiogram. Echocardiography is a painless test that uses sound waves to create images of your heart. It provides your doctor with information about the size and shape of your heart and how well your heart's chambers and valves are working. This procedure takes approximately one hour. There are no restrictions for this procedure. This will be performed at our [facility] location - [address]  2. Remote Pacemaker Transmission - Remote monitoring is used to monitor your Pacemaker of ICD from home. This monitoring reduces the number of office visits required to check your device to one time per year. It allows [place] to keep an eye on the functioning of your device to ensure it is working properly. You are scheduled for a device check from home on Wednesday, April 10th, 2019. You may send your transmission at any time that day. If you have a wireless device, the transmission will be sent automatically. After your physician reviews your transmission, you will receive a postcard with your next transmission date.  Follow-up: Your physician recommends that you schedule a follow-up appointment in 2-3 weeks with Kerin Ransom, PA-C.  Dr Sallyanne Kuster recommends that you schedule a follow-up appointment in 6 months. You will receive a reminder letter in the mail two months in advance. If you don't receive a letter, please call our office to schedule the follow-up appointment.  If you need a refill on your cardiac medications before your next appointment,  please call your pharmacy.

## 2017-06-12 ENCOUNTER — Ambulatory Visit (HOSPITAL_COMMUNITY): Payer: Medicare Other | Attending: Cardiology

## 2017-06-12 ENCOUNTER — Other Ambulatory Visit: Payer: Self-pay

## 2017-06-12 DIAGNOSIS — I350 Nonrheumatic aortic (valve) stenosis: Secondary | ICD-10-CM | POA: Diagnosis not present

## 2017-06-12 DIAGNOSIS — I083 Combined rheumatic disorders of mitral, aortic and tricuspid valves: Secondary | ICD-10-CM | POA: Diagnosis not present

## 2017-06-12 LAB — ECHOCARDIOGRAM COMPLETE
AOPV: 0.39 m/s
AV Area mean vel: 1.4 cm2
AV VEL mean LVOT/AV: 0.49
AV area mean vel ind: 0.84 cm2/m2
AV peak Index: 0.67
AV pk vel: 228 cm/s
AVA: 1.61 cm2
AVAREAVTI: 1.12 cm2
AVAREAVTIIND: 0.97 cm2/m2
AVG: 8 mmHg
AVLVOTPG: 3 mmHg
AVPG: 21 mmHg
CHL CUP AV VEL: 1.61
CHL CUP DOP CALC LVOT VTI: 20.6 cm
CHL CUP TV REG PEAK VELOCITY: 243 cm/s
DOP CAL AO MEAN VELOCITY: 126 cm/s
FS: 28 % (ref 28–44)
IVS/LV PW RATIO, ED: 1.52
LA diam end sys: 34 mm
LA diam index: 2.05 cm/m2
LA vol A4C: 103 ml
LA vol index: 59.6 mL/m2
LASIZE: 34 mm
LAVOL: 99 mL
LVOT SV: 59 mL
LVOT area: 2.84 cm2
LVOT peak VTI: 0.57 cm
LVOT peak vel: 89.8 cm/s
LVOTD: 19 mm
P 1/2 time: 687 ms
PW: 9.95 mm — AB (ref 0.6–1.1)
TR max vel: 243 cm/s
VTI: 36.3 cm
Valve area index: 0.97

## 2017-06-23 DIAGNOSIS — Z79899 Other long term (current) drug therapy: Secondary | ICD-10-CM | POA: Diagnosis not present

## 2017-06-23 LAB — BASIC METABOLIC PANEL
BUN/Creatinine Ratio: 18 (ref 12–28)
BUN: 17 mg/dL (ref 8–27)
CO2: 25 mmol/L (ref 20–29)
CREATININE: 0.95 mg/dL (ref 0.57–1.00)
Calcium: 10.4 mg/dL — ABNORMAL HIGH (ref 8.7–10.3)
Chloride: 100 mmol/L (ref 96–106)
GFR, EST AFRICAN AMERICAN: 61 mL/min/{1.73_m2} (ref >59)
GFR, EST NON AFRICAN AMERICAN: 53 mL/min/{1.73_m2} — AB (ref >59)
Glucose: 93 mg/dL (ref 65–99)
POTASSIUM: 5 mmol/L (ref 3.5–5.2)
SODIUM: 142 mmol/L (ref 134–144)

## 2017-06-25 ENCOUNTER — Ambulatory Visit (INDEPENDENT_AMBULATORY_CARE_PROVIDER_SITE_OTHER): Payer: Medicare Other | Admitting: Cardiology

## 2017-06-25 ENCOUNTER — Ambulatory Visit: Payer: Medicare Other | Admitting: Cardiology

## 2017-06-25 ENCOUNTER — Encounter: Payer: Self-pay | Admitting: Cardiology

## 2017-06-25 ENCOUNTER — Other Ambulatory Visit: Payer: Self-pay | Admitting: Cardiovascular Disease

## 2017-06-25 VITALS — BP 130/80 | HR 71 | Ht 62.0 in | Wt 145.0 lb

## 2017-06-25 DIAGNOSIS — I2510 Atherosclerotic heart disease of native coronary artery without angina pectoris: Secondary | ICD-10-CM | POA: Diagnosis not present

## 2017-06-25 DIAGNOSIS — Z950 Presence of cardiac pacemaker: Secondary | ICD-10-CM

## 2017-06-25 DIAGNOSIS — R0602 Shortness of breath: Secondary | ICD-10-CM

## 2017-06-25 DIAGNOSIS — I50811 Acute right heart failure: Secondary | ICD-10-CM | POA: Diagnosis not present

## 2017-06-25 DIAGNOSIS — I714 Abdominal aortic aneurysm, without rupture, unspecified: Secondary | ICD-10-CM

## 2017-06-25 DIAGNOSIS — I481 Persistent atrial fibrillation: Secondary | ICD-10-CM | POA: Diagnosis not present

## 2017-06-25 DIAGNOSIS — Z7901 Long term (current) use of anticoagulants: Secondary | ICD-10-CM | POA: Diagnosis not present

## 2017-06-25 DIAGNOSIS — C44622 Squamous cell carcinoma of skin of right upper limb, including shoulder: Secondary | ICD-10-CM | POA: Diagnosis not present

## 2017-06-25 DIAGNOSIS — I701 Atherosclerosis of renal artery: Secondary | ICD-10-CM

## 2017-06-25 DIAGNOSIS — I4811 Longstanding persistent atrial fibrillation: Secondary | ICD-10-CM

## 2017-06-25 DIAGNOSIS — I350 Nonrheumatic aortic (valve) stenosis: Secondary | ICD-10-CM | POA: Diagnosis not present

## 2017-06-25 NOTE — Assessment & Plan Note (Signed)
Not clear if she still has volume overload. Her wgt is actually up 3 lbs from her LOV despite increased Lasix (which she didn't take today)

## 2017-06-25 NOTE — Assessment & Plan Note (Signed)
Dual-chamber [employer] ingenio model [id number], serial #[id number], implanted December 2013

## 2017-06-25 NOTE — Assessment & Plan Note (Signed)
Followed by Dr. Croitoru 

## 2017-06-25 NOTE — Progress Notes (Signed)
06/25/2017 [name]   10-20-1925  [id number]  Primary Physician Merrilee Seashore, MD Primary Cardiologist: Dr Sallyanne Kuster  HPI:  84 y.o. female with a history of AF s/p prior RFA, now most likely in CAF, on Pradaxa , moderate CAD by cath in 2016, status post pacemaker for tachycardia-bradycardia syndrome, and COPD. She saw Dr Sallyanne Kuster for a pacemaker check 06/04/17 and mentioned several months of dyspnea on exertion and episodes which sounded like orthopnea. Dr Sallyanne Kuster increased her daily Lasix and ordered an echo. She is in the office todsay for follow up. Her daughter accompanied her. The pt tells me she continues to have DOE. She doesn't think the Lasix helped, she didn't take it yet today because she had this office visit. She told me she was SOB when I was examining her but her respirations were normal and her O2 sat 97%. She denies palpitations but feel like her heart "isn't right'.    Current Outpatient Medications  Medication Sig Dispense Refill  . Calcium Carbonate-Vitamin D (CALCIUM + D PO) Take 1 tablet by mouth 2 (two) times daily.    . dabigatran (PRADAXA) 75 MG CAPS capsule TAKE ONE CAPSULE BY MOUTH EVERY 12 HOURS 60 capsule 0  . furosemide (LASIX) 40 MG tablet Take 1 tablet (40 mg total) by mouth daily. 90 tablet 3  . loratadine (CLARITIN) 10 MG tablet Take 10 mg by mouth daily as needed for allergies.     . magnesium oxide (MAG-OX) 400 (241.3 Mg) MG tablet Take 1 tablet (400 mg total) by mouth daily. Please call and schedule January 2019 follow up. [phone] 30 tablet 4  . metoprolol succinate (TOPROL-XL) 100 MG 24 hr tablet Take 1 tablet (100 mg total) by mouth daily. Take with or immediately following a meal. 90 tablet 3  . Multiple Vitamins-Minerals (MULTIVITAMINS THER. W/MINERALS) TABS Take 1 tablet by mouth every morning.      . Multiple Vitamins-Minerals (PRESERVISION AREDS 2) CAPS Take 1 capsule by mouth 2 (two) times daily.     . Omega-3 Fatty Acids (FISH OIL)  1000 MG CAPS Take 1,000 mg by mouth daily.     . pantoprazole (PROTONIX) 40 MG tablet Take 40 mg by mouth 2 (two) times daily.     Vladimir Faster Glycol-Propyl Glycol (SYSTANE OP) Place 2 drops into both eyes 2 (two) times daily as needed (for dry eyes).    . rosuvastatin (CRESTOR) 5 MG tablet Take 1 tablet (5 mg total) by mouth every evening. 28 tablet 0   No current facility-administered medications for this visit.     Allergies  Allergen Reactions  . Tramadol Other (See Comments)    Medication made her feel "off and talk to herself"  . Protonix [Pantoprazole Sodium] Other (See Comments)    Abdominal pain    Past Medical History:  Diagnosis Date  . Abdominal aortic aneurysm ([place])    3cm by CT 09/2014  . Abdominal pain    due to Sequential arterial mediolysis.   . Allergic rhinitis   . Aneurysm ([place])    reports having liver "aneurysms" for which she underwent coiling  . Arthritis   . Blood transfusion   . Cancer ([place])    myelonoma behind eye  . Cardiac tamponade, recurrent episode, admitted 9/5, but now felt to be pericarditits 01/17/2012  . Complete heart block ([place]) 01/23/2016   Afib   . COPD (chronic obstructive pulmonary disease) ([place])   . Diverticulosis of colon (without mention of hemorrhage)   .  Dysfunction of eustachian tube   . Fibromuscular dysplasia ([place])   . GERD (gastroesophageal reflux disease)   . Head injury, acute, with loss of consciousness ([place]) 1987    for 4 -5 days. Hit in head with a screw from a swing.  Marland Kitchen Heart murmur   . Hiatal hernia   . HTN (hypertension)   . Hyperlipidemia   . Pacemaker   . PAF (paroxysmal atrial fibrillation), after a. fib ablation at [facility], now with RVR 01/05/2012  . Persistent atrial fibrillation ([place])   . Personal history of colonic polyps   . Pneumonia    04/08/17- "many years ago"  . Shortness of breath    when coverts to AFib  . Valvular heart disease    mild AS, mild MR, mild-mod TR by echo 06/2014     Social  History   Socioeconomic History  . Marital status: Married    Spouse name: Not on file  . Number of children: 4  . Years of education: Not on file  . Highest education level: Not on file  Social Needs  . Financial resource strain: Not on file  . Food insecurity - worry: Not on file  . Food insecurity - inability: Not on file  . Transportation needs - medical: Not on file  . Transportation needs - non-medical: Not on file  Occupational History  . Occupation: retired    Fish farm manager: RETIRED  Tobacco Use  . Smoking status: Former Smoker    Packs/day: 1.00    Years: 25.00    Pack years: 25.00    Types: Cigarettes    Last attempt to quit: 06/10/1982    Years since quitting: 35.0  . Smokeless tobacco: Never Used  . Tobacco comment: former smoker x 22+ years, positive for second-hand smoke exposure  Substance and Sexual Activity  . Alcohol use: No    Alcohol/week: 0.0 oz  . Drug use: No  . Sexual activity: Not on file  Other Topics Concern  . Not on file  Social History Narrative   Occasionally exercises   Rarely drinks caffeine   4 children, all boys   Lives in [place].     Family History  Problem Relation Age of Onset  . Heart disease Mother   . Heart failure Father   . Prostate cancer Father   . Arthritis Sister   . Atrial fibrillation Sister   . Colon cancer Neg Hx   . Anesthesia problems Neg Hx   . Hypotension Neg Hx   . Malignant hyperthermia Neg Hx   . Pseudochol deficiency Neg Hx      Review of Systems: General: negative for chills, fever, night sweats or weight changes.  Cardiovascular: negative for chest pain, edema, palpitations, paroxysmal nocturnal dyspnea or shortness of breath Dermatological: negative for rash Respiratory: negative for cough or wheezing Urologic: negative for hematuria Abdominal: negative for nausea, vomiting, diarrhea, bright red blood per rectum, melena, or hematemesis Neurologic: negative for visual changes, syncope, or  dizziness All other systems reviewed and are otherwise negative except as noted above.    Blood pressure 130/80, pulse 71, height 5\' 2"  (1.575 m), weight 145 lb (65.8 kg), SpO2 97 %.  General appearance: alert, cooperative, no distress and looks younger than 17 Neck: no carotid bruit and no JVD Lungs: basilar crackles Heart: regular rate and rhythm Extremities: trace edema Skin: Skin color, texture, turgor normal. No rashes or lesions Neurologic: Grossly normal   ASSESSMENT AND PLAN:   Acute CHF, presumed  secondary to Rt heart failure Not clear if she still has volume overload. Her wgt is actually up 3 lbs from her LOV despite increased Lasix (which she didn't take today)  Aortic stenosis, mild Mild AS with EF 65-70% by echo 06/12/17 Mild MR, moderate TR  CAD (coronary artery disease), native coronary artery Non obstructive moderate CAD Dec 2016  Current use of long term anticoagulation Pradaxa  Pacemaker Dual-chamber [employer] ingenio model [place], serial #[id number], implanted December 2013  Longstanding persistent atrial fibrillation ([place]) Followed by Dr Sallyanne Kuster   PLAN  Check BNP. Further recommendations following this.  Kerin Ransom PA-C 06/25/2017 3:11 PM

## 2017-06-25 NOTE — Assessment & Plan Note (Signed)
Mild AS with EF 65-70% by echo 06/12/17 Mild MR, moderate TR

## 2017-06-25 NOTE — Patient Instructions (Signed)
Medication Instructions: Your physician recommends that you continue on your current medications as directed. Please refer to the Current Medication list given to you today.  Labwork: Your physician recommends that you return for lab work in: today--BNP   Follow-Up: We will call you with the results of your blood work to discuss follow-up.

## 2017-06-25 NOTE — Assessment & Plan Note (Signed)
Non obstructive moderate CAD Dec 2016

## 2017-06-25 NOTE — Assessment & Plan Note (Signed)
Pradaxa 

## 2017-06-26 LAB — BRAIN NATRIURETIC PEPTIDE: BNP: 391.2 pg/mL — ABNORMAL HIGH (ref 0.0–100.0)

## 2017-06-26 NOTE — Progress Notes (Signed)
BNP  >300, but lower than it was in 2015 or 2016. Mcr

## 2017-07-01 ENCOUNTER — Other Ambulatory Visit: Payer: Self-pay

## 2017-07-01 DIAGNOSIS — I50811 Acute right heart failure: Secondary | ICD-10-CM

## 2017-07-15 ENCOUNTER — Other Ambulatory Visit: Payer: Self-pay | Admitting: *Deleted

## 2017-07-15 DIAGNOSIS — I50811 Acute right heart failure: Secondary | ICD-10-CM

## 2017-07-16 LAB — BASIC METABOLIC PANEL
BUN/Creatinine Ratio: 22 (ref 12–28)
BUN: 20 mg/dL (ref 8–27)
CO2: 25 mmol/L (ref 20–29)
Calcium: 9.8 mg/dL (ref 8.7–10.3)
Chloride: 103 mmol/L (ref 96–106)
Creatinine, Ser: 0.91 mg/dL (ref 0.57–1.00)
GFR calc Af Amer: 65 mL/min/{1.73_m2} (ref >59)
GFR calc non Af Amer: 56 mL/min/{1.73_m2} — ABNORMAL LOW (ref >59)
Glucose: 86 mg/dL (ref 65–99)
Potassium: 5 mmol/L (ref 3.5–5.2)
Sodium: 144 mmol/L (ref 134–144)

## 2017-07-24 ENCOUNTER — Other Ambulatory Visit: Payer: Self-pay | Admitting: Cardiovascular Disease

## 2017-08-18 ENCOUNTER — Other Ambulatory Visit: Payer: Self-pay | Admitting: Cardiovascular Disease

## 2017-08-18 DIAGNOSIS — I480 Paroxysmal atrial fibrillation: Secondary | ICD-10-CM

## 2017-08-18 DIAGNOSIS — Z950 Presence of cardiac pacemaker: Secondary | ICD-10-CM

## 2017-08-18 MED ORDER — MAGNESIUM OXIDE 400 (241.3 MG) MG PO TABS: 400.0000 mg | ORAL_TABLET | Freq: Every day | ORAL | 6 refills | Status: DC

## 2017-08-20 ENCOUNTER — Ambulatory Visit (INDEPENDENT_AMBULATORY_CARE_PROVIDER_SITE_OTHER): Payer: Medicare Other | Admitting: *Deleted

## 2017-08-20 DIAGNOSIS — I442 Atrioventricular block, complete: Secondary | ICD-10-CM

## 2017-08-20 NOTE — Progress Notes (Signed)
Remote pacemaker transmission.   

## 2017-08-21 ENCOUNTER — Encounter: Payer: Self-pay | Admitting: Cardiology

## 2017-08-26 DIAGNOSIS — D485 Neoplasm of uncertain behavior of skin: Secondary | ICD-10-CM | POA: Diagnosis not present

## 2017-08-26 DIAGNOSIS — L570 Actinic keratosis: Secondary | ICD-10-CM | POA: Diagnosis not present

## 2017-08-26 DIAGNOSIS — L821 Other seborrheic keratosis: Secondary | ICD-10-CM | POA: Diagnosis not present

## 2017-08-26 DIAGNOSIS — L905 Scar conditions and fibrosis of skin: Secondary | ICD-10-CM | POA: Diagnosis not present

## 2017-08-26 DIAGNOSIS — Z85828 Personal history of other malignant neoplasm of skin: Secondary | ICD-10-CM | POA: Diagnosis not present

## 2017-08-29 ENCOUNTER — Ambulatory Visit (INDEPENDENT_AMBULATORY_CARE_PROVIDER_SITE_OTHER): Payer: Medicare Other | Admitting: Cardiovascular Disease

## 2017-08-29 ENCOUNTER — Encounter: Payer: Self-pay | Admitting: Cardiovascular Disease

## 2017-08-29 VITALS — BP 154/87 | HR 72 | Ht 62.0 in | Wt 142.0 lb

## 2017-08-29 DIAGNOSIS — I482 Chronic atrial fibrillation: Secondary | ICD-10-CM

## 2017-08-29 DIAGNOSIS — Z950 Presence of cardiac pacemaker: Secondary | ICD-10-CM

## 2017-08-29 DIAGNOSIS — I5032 Chronic diastolic (congestive) heart failure: Secondary | ICD-10-CM

## 2017-08-29 DIAGNOSIS — I701 Atherosclerosis of renal artery: Secondary | ICD-10-CM | POA: Diagnosis not present

## 2017-08-29 DIAGNOSIS — I442 Atrioventricular block, complete: Secondary | ICD-10-CM

## 2017-08-29 DIAGNOSIS — I350 Nonrheumatic aortic (valve) stenosis: Secondary | ICD-10-CM

## 2017-08-29 DIAGNOSIS — I4821 Permanent atrial fibrillation: Secondary | ICD-10-CM

## 2017-08-29 NOTE — Patient Instructions (Signed)
Dr Sallyanne Kuster recommends that you continue on your current medications as directed. Please refer to the Current Medication list given to you today.  Remote monitoring is used to monitor your Pacemaker or ICD from home. This monitoring reduces the number of office visits required to check your device to one time per year. It allows [place] to keep an eye on the functioning of your device to ensure it is working properly. You are scheduled for a device check from home on Wednesday, July 10th, 2019. You may send your transmission at any time that day. If you have a wireless device, the transmission will be sent automatically. After your physician reviews your transmission, you will receive a notification with your next transmission date.  To improve our patient care and to more adequately follow your device, [facility] has decided, as a practice, to start following each patient four times a year with your home monitor. This means that you may experience a remote appointment that is close to an in-office appointment with your physician. Your insurance will apply at the same rate as other remote monitoring transmissions.  Dr Sallyanne Kuster recommends that you schedule a follow-up appointment in 12 months with a pacemaker check. You will receive a reminder letter in the mail two months in advance. If you don't receive a letter, please call our office to schedule the follow-up appointment.  If you need a refill on your cardiac medications before your next appointment, please call your pharmacy.
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
## 2017-09-05 DIAGNOSIS — H353122 Nonexudative age-related macular degeneration, left eye, intermediate dry stage: Secondary | ICD-10-CM | POA: Diagnosis not present

## 2017-09-05 DIAGNOSIS — H5212 Myopia, left eye: Secondary | ICD-10-CM | POA: Diagnosis not present

## 2017-09-05 DIAGNOSIS — H31001 Unspecified chorioretinal scars, right eye: Secondary | ICD-10-CM | POA: Diagnosis not present

## 2017-09-05 DIAGNOSIS — Z961 Presence of intraocular lens: Secondary | ICD-10-CM | POA: Diagnosis not present

## 2017-09-26 LAB — CUP PACEART REMOTE DEVICE CHECK
Date Time Interrogation Session: 20190517111405
Implantable Lead Implant Date: 20131211
Implantable Lead Location: 753860
Implantable Lead Model: 4135
Lead Channel Setting Pacing Pulse Width: 0.5 ms
Lead Channel Setting Sensing Sensitivity: 2.5 mV
MDC IDC LEAD IMPLANT DT: 20131211
MDC IDC LEAD LOCATION: 753859
MDC IDC LEAD SERIAL: 29296655
MDC IDC LEAD SERIAL: 29317536
MDC IDC PG IMPLANT DT: 20131211
MDC IDC PG SERIAL: 144309
MDC IDC SET LEADCHNL RA PACING AMPLITUDE: 2 V
MDC IDC SET LEADCHNL RV PACING AMPLITUDE: 2.4 V

## 2017-10-11 DIAGNOSIS — I71012 Dissection of descending thoracic aorta: Secondary | ICD-10-CM

## 2017-10-11 DIAGNOSIS — I7101 Dissection of thoracic aorta: Secondary | ICD-10-CM

## 2017-10-11 DIAGNOSIS — I7123 Aneurysm of the descending thoracic aorta, without rupture: Secondary | ICD-10-CM

## 2017-10-11 HISTORY — DX: Dissection of descending thoracic aorta: I71.012

## 2017-10-11 HISTORY — DX: Dissection of thoracic aorta: I71.01

## 2017-10-11 HISTORY — DX: Aneurysm of the descending thoracic aorta, without rupture: I71.23

## 2017-10-21 DIAGNOSIS — E782 Mixed hyperlipidemia: Secondary | ICD-10-CM | POA: Diagnosis not present

## 2017-10-21 DIAGNOSIS — I480 Paroxysmal atrial fibrillation: Secondary | ICD-10-CM | POA: Diagnosis not present

## 2017-10-21 DIAGNOSIS — I10 Essential (primary) hypertension: Secondary | ICD-10-CM | POA: Diagnosis not present

## 2017-10-22 LAB — CUP PACEART INCLINIC DEVICE CHECK
Date Time Interrogation Session: 20190612160822
Implantable Lead Implant Date: 20131211
Implantable Lead Implant Date: 20131211
Implantable Lead Model: 4135
Implantable Lead Serial Number: 29317536
Lead Channel Setting Pacing Amplitude: 2 V
Lead Channel Setting Pacing Pulse Width: 0.5 ms
Lead Channel Setting Sensing Sensitivity: 2.5 mV
MDC IDC LEAD LOCATION: 753859
MDC IDC LEAD LOCATION: 753860
MDC IDC LEAD SERIAL: 29296655
MDC IDC PG IMPLANT DT: 20131211
MDC IDC PG SERIAL: 144309
MDC IDC SET LEADCHNL RV PACING AMPLITUDE: 2.4 V

## 2017-10-23 ENCOUNTER — Inpatient Hospital Stay (HOSPITAL_COMMUNITY)
Admission: EM | Admit: 2017-10-23 | Discharge: 2017-10-27 | DRG: 300 | Disposition: A | Discharge status: Home / Self Care | Payer: Medicare Other | Attending: Family Medicine | Admitting: Family Medicine

## 2017-10-23 ENCOUNTER — Emergency Department (HOSPITAL_COMMUNITY): Payer: Medicare Other

## 2017-10-23 ENCOUNTER — Encounter (HOSPITAL_COMMUNITY): Payer: Self-pay

## 2017-10-23 DIAGNOSIS — Z87820 Personal history of traumatic brain injury: Secondary | ICD-10-CM

## 2017-10-23 DIAGNOSIS — D696 Thrombocytopenia, unspecified: Secondary | ICD-10-CM | POA: Diagnosis present

## 2017-10-23 DIAGNOSIS — I110 Hypertensive heart disease with heart failure: Secondary | ICD-10-CM | POA: Diagnosis present

## 2017-10-23 DIAGNOSIS — H6980 Other specified disorders of Eustachian tube, unspecified ear: Secondary | ICD-10-CM | POA: Diagnosis present

## 2017-10-23 DIAGNOSIS — Z885 Allergy status to narcotic agent status: Secondary | ICD-10-CM

## 2017-10-23 DIAGNOSIS — I480 Paroxysmal atrial fibrillation: Secondary | ICD-10-CM | POA: Diagnosis present

## 2017-10-23 DIAGNOSIS — I714 Abdominal aortic aneurysm, without rupture: Secondary | ICD-10-CM | POA: Diagnosis not present

## 2017-10-23 DIAGNOSIS — I5032 Chronic diastolic (congestive) heart failure: Secondary | ICD-10-CM | POA: Diagnosis present

## 2017-10-23 DIAGNOSIS — Z86010 Personal history of colonic polyps: Secondary | ICD-10-CM

## 2017-10-23 DIAGNOSIS — Z961 Presence of intraocular lens: Secondary | ICD-10-CM | POA: Diagnosis present

## 2017-10-23 DIAGNOSIS — I71019 Dissection of thoracic aorta, unspecified: Secondary | ICD-10-CM

## 2017-10-23 DIAGNOSIS — Z950 Presence of cardiac pacemaker: Secondary | ICD-10-CM | POA: Diagnosis not present

## 2017-10-23 DIAGNOSIS — J449 Chronic obstructive pulmonary disease, unspecified: Secondary | ICD-10-CM | POA: Diagnosis present

## 2017-10-23 DIAGNOSIS — E7849 Other hyperlipidemia: Secondary | ICD-10-CM | POA: Diagnosis not present

## 2017-10-23 DIAGNOSIS — I482 Chronic atrial fibrillation: Secondary | ICD-10-CM | POA: Diagnosis present

## 2017-10-23 DIAGNOSIS — I712 Thoracic aortic aneurysm, without rupture: Secondary | ICD-10-CM | POA: Diagnosis present

## 2017-10-23 DIAGNOSIS — I2510 Atherosclerotic heart disease of native coronary artery without angina pectoris: Secondary | ICD-10-CM | POA: Diagnosis present

## 2017-10-23 DIAGNOSIS — M1990 Unspecified osteoarthritis, unspecified site: Secondary | ICD-10-CM | POA: Diagnosis present

## 2017-10-23 DIAGNOSIS — Z85840 Personal history of malignant neoplasm of eye: Secondary | ICD-10-CM

## 2017-10-23 DIAGNOSIS — Z7902 Long term (current) use of antithrombotics/antiplatelets: Secondary | ICD-10-CM

## 2017-10-23 DIAGNOSIS — K219 Gastro-esophageal reflux disease without esophagitis: Secondary | ICD-10-CM | POA: Diagnosis present

## 2017-10-23 DIAGNOSIS — Z8261 Family history of arthritis: Secondary | ICD-10-CM

## 2017-10-23 DIAGNOSIS — I10 Essential (primary) hypertension: Secondary | ICD-10-CM | POA: Diagnosis not present

## 2017-10-23 DIAGNOSIS — I7102 Dissection of abdominal aorta: Secondary | ICD-10-CM | POA: Diagnosis not present

## 2017-10-23 DIAGNOSIS — Z9841 Cataract extraction status, right eye: Secondary | ICD-10-CM | POA: Diagnosis not present

## 2017-10-23 DIAGNOSIS — I7101 Dissection of thoracic aorta: Secondary | ICD-10-CM

## 2017-10-23 DIAGNOSIS — R0789 Other chest pain: Secondary | ICD-10-CM | POA: Diagnosis not present

## 2017-10-23 DIAGNOSIS — Z9842 Cataract extraction status, left eye: Secondary | ICD-10-CM

## 2017-10-23 DIAGNOSIS — I719 Aortic aneurysm of unspecified site, without rupture: Secondary | ICD-10-CM | POA: Diagnosis present

## 2017-10-23 DIAGNOSIS — Z79899 Other long term (current) drug therapy: Secondary | ICD-10-CM

## 2017-10-23 DIAGNOSIS — Z96659 Presence of unspecified artificial knee joint: Secondary | ICD-10-CM | POA: Diagnosis present

## 2017-10-23 DIAGNOSIS — Z888 Allergy status to other drugs, medicaments and biological substances status: Secondary | ICD-10-CM

## 2017-10-23 DIAGNOSIS — E785 Hyperlipidemia, unspecified: Secondary | ICD-10-CM | POA: Diagnosis present

## 2017-10-23 DIAGNOSIS — R079 Chest pain, unspecified: Secondary | ICD-10-CM | POA: Diagnosis not present

## 2017-10-23 DIAGNOSIS — I350 Nonrheumatic aortic (valve) stenosis: Secondary | ICD-10-CM | POA: Diagnosis present

## 2017-10-23 DIAGNOSIS — Z8679 Personal history of other diseases of the circulatory system: Secondary | ICD-10-CM

## 2017-10-23 DIAGNOSIS — J431 Panlobular emphysema: Secondary | ICD-10-CM | POA: Diagnosis not present

## 2017-10-23 DIAGNOSIS — J309 Allergic rhinitis, unspecified: Secondary | ICD-10-CM | POA: Diagnosis present

## 2017-10-23 DIAGNOSIS — Z87891 Personal history of nicotine dependence: Secondary | ICD-10-CM | POA: Diagnosis not present

## 2017-10-23 DIAGNOSIS — Z9049 Acquired absence of other specified parts of digestive tract: Secondary | ICD-10-CM | POA: Diagnosis not present

## 2017-10-23 DIAGNOSIS — I442 Atrioventricular block, complete: Secondary | ICD-10-CM | POA: Diagnosis present

## 2017-10-23 DIAGNOSIS — I481 Persistent atrial fibrillation: Secondary | ICD-10-CM | POA: Diagnosis present

## 2017-10-23 DIAGNOSIS — Z8249 Family history of ischemic heart disease and other diseases of the circulatory system: Secondary | ICD-10-CM

## 2017-10-23 LAB — BASIC METABOLIC PANEL
ANION GAP: 8 (ref 5–15)
BUN: 19 mg/dL (ref 6–20)
CALCIUM: 10.1 mg/dL (ref 8.9–10.3)
CO2: 29 mmol/L (ref 22–32)
Chloride: 106 mmol/L (ref 101–111)
Creatinine, Ser: 1.29 mg/dL — ABNORMAL HIGH (ref 0.44–1.00)
GFR calc non Af Amer: 35 mL/min — ABNORMAL LOW (ref >60)
GFR, EST AFRICAN AMERICAN: 41 mL/min — AB (ref >60)
Glucose, Bld: 104 mg/dL — ABNORMAL HIGH (ref 65–99)
Potassium: 5.1 mmol/L (ref 3.5–5.1)
Sodium: 143 mmol/L (ref 135–145)

## 2017-10-23 LAB — CBC
HCT: 49.2 % — ABNORMAL HIGH (ref 36.0–46.0)
HEMOGLOBIN: 15.6 g/dL — AB (ref 12.0–15.0)
MCH: 31.4 pg (ref 26.0–34.0)
MCHC: 31.7 g/dL (ref 30.0–36.0)
MCV: 99 fL (ref 78.0–100.0)
PLATELETS: 152 10*3/uL (ref 150–400)
RBC: 4.97 MIL/uL (ref 3.87–5.11)
RDW: 13.4 % (ref 11.5–15.5)
WBC: 7.5 10*3/uL (ref 4.0–10.5)

## 2017-10-23 LAB — I-STAT TROPONIN, ED: Troponin i, poc: 0.01 ng/mL (ref 0.00–0.08)

## 2017-10-23 LAB — PROTIME-INR
INR: 1.24
Prothrombin Time: 15.5 seconds — ABNORMAL HIGH (ref 11.4–15.2)

## 2017-10-23 MED ORDER — FAMOTIDINE 20 MG PO TABS: 20.0000 mg | ORAL_TABLET | Freq: Every day | ORAL | Status: DC

## 2017-10-23 MED ORDER — IOPAMIDOL (ISOVUE-370) INJECTION 76%
INTRAVENOUS | Status: AC
  Filled 2017-10-23: qty 100

## 2017-10-23 MED ORDER — SODIUM CHLORIDE 0.9 % IV BOLUS
500.0000 mL | Freq: Once | INTRAVENOUS | Status: AC
  Administered 2017-10-23: 500 mL via INTRAVENOUS

## 2017-10-23 MED ORDER — ROSUVASTATIN CALCIUM 10 MG PO TABS
5.0000 mg | ORAL_TABLET | Freq: Every evening | ORAL | Status: DC
  Administered 2017-10-24 – 2017-10-26 (×3): 5 mg via ORAL
  Filled 2017-10-23 (×4): qty 1

## 2017-10-23 MED ORDER — METOPROLOL TARTRATE 25 MG PO TABS
50.0000 mg | ORAL_TABLET | Freq: Two times a day (BID) | ORAL | Status: DC
  Administered 2017-10-24 – 2017-10-25 (×3): 50 mg via ORAL
  Filled 2017-10-23 (×3): qty 2

## 2017-10-23 MED ORDER — SODIUM CHLORIDE 0.9 % IV SOLN: 250.0000 mL | INTRAVENOUS | Status: DC | PRN

## 2017-10-23 MED ORDER — FUROSEMIDE 40 MG PO TABS
40.0000 mg | ORAL_TABLET | Freq: Every day | ORAL | Status: DC
  Administered 2017-10-24 – 2017-10-27 (×4): 40 mg via ORAL
  Filled 2017-10-23 (×3): qty 2
  Filled 2017-10-23: qty 1

## 2017-10-23 MED ORDER — LEVALBUTEROL HCL 0.63 MG/3ML IN NEBU: 0.6300 mg | INHALATION_SOLUTION | RESPIRATORY_TRACT | Status: DC | PRN

## 2017-10-23 MED ORDER — DABIGATRAN ETEXILATE MESYLATE 75 MG PO CAPS
75.0000 mg | ORAL_CAPSULE | Freq: Two times a day (BID) | ORAL | Status: DC
  Administered 2017-10-24 – 2017-10-25 (×3): 75 mg via ORAL
  Filled 2017-10-23 (×6): qty 1

## 2017-10-23 MED ORDER — IOPAMIDOL (ISOVUE-370) INJECTION 76%
100.0000 mL | Freq: Once | INTRAVENOUS | Status: AC | PRN
  Administered 2017-10-23: 100 mL via INTRAVENOUS

## 2017-10-23 MED ORDER — DEXTROSE 5 % IV SOLN
0.5000 mg/min | INTRAVENOUS | Status: DC
  Administered 2017-10-23: 0.5 mg/min via INTRAVENOUS
  Filled 2017-10-23 (×2): qty 100

## 2017-10-23 MED ORDER — FAMOTIDINE 20 MG PO TABS
20.0000 mg | ORAL_TABLET | Freq: Every day | ORAL | Status: DC
  Administered 2017-10-24 – 2017-10-26 (×3): 20 mg via ORAL
  Filled 2017-10-23 (×3): qty 1

## 2017-10-23 MED ORDER — ESMOLOL HCL-SODIUM CHLORIDE 2000 MG/100ML IV SOLN
25.0000 ug/kg/min | Freq: Once | INTRAVENOUS | Status: DC
  Filled 2017-10-23: qty 100
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
## 2017-10-23 NOTE — ED Notes (Signed)
Hoffman at bedside.
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
## 2017-10-23 NOTE — ED Triage Notes (Signed)
Patient complains of 2 episodes of CP that started 0700 with radiation to back this am, took aspirin 324mg  this am and with rest it resolved at lunch. Today after going out on porch around 430pm the pain returned. Alert and oriented, NAd

## 2017-10-23 NOTE — ED Notes (Signed)
Bridgett Larsson, MD from vascular surgery at bedside.

## 2017-10-23 NOTE — ED Notes (Signed)
Nurse went in to begin patient care. Patient initially refusing IV. States that she is feeling better and is ready to go home. EDP informed. Ray at bedside discussing care. Patient stated that she would stay for observation and treatment.

## 2017-10-23 NOTE — ED Notes (Signed)
Patient transported to CT 

## 2017-10-23 NOTE — ED Notes (Signed)
Pt endorses mid left chest pain with radiation to the back and some nausea after waking up this morning. States she gets shob any time she walks, no new shob or worsening shob since chest pain began. Has a pacemaker for a-fib. VSS. Axox4.

## 2017-10-23 NOTE — H&P (Addendum)
Name: [name] MRN: [id number] DOB: 13-Dec-1925    ADMISSION DATE:  10/23/2017 CONSULTATION DATE:  10/23/2017  REFERRING MD :  Dr. Jeanell Sparrow EDP  CHIEF COMPLAINT:  Type B aortic dissection  HISTORY OF PRESENT ILLNESS:  84 year old female with PMH as below, which is significant for Atrial fibrillation on pradaxa, Complete heart block s/p Pacemaker, COPD, and hypertension. She was in her usual state of health until 6/11 when she developed chest pain and mild SOB. She reports these symptoms off and on for years, but now they were more severe and more constant. Chest pain was described as left sternal with radiation to the back. Her husband convinced her to present to the ED 6/13.   SIGNIFICANT EVENTS  6/13 admit  STUDIES:  CT angio chest 6/13 > Findings suspicious for atypical/early type B aortic dissection arising distal to the takeoff of the great vessels in the transverse aorta. A well-defined flap is not visualized, however there is heterogeneous intraluminal low-density with heterogeneous flow extending throughout the descending aorta and involving the abdominal aorta in the expected distribution of aortic dissection. Aneurysmal dilatation of the ascending aorta, maximal dimension 4.2 cm. Infrarenal aortic aneurysm measuring 4.7 x 4.0 cm without evidence of rupture. Diffuse thoracoabdominal aortic atherosclerosis. Mild cardiomegaly with primarily right heart dilatation and contrast refluxing into the hepatic veins and IVC consistent with elevated right heart pressures.   PAST MEDICAL HISTORY :   has a past medical history of Abdominal aortic aneurysm ([place]), Abdominal pain, Allergic rhinitis, Aneurysm ([place]), Arthritis, Blood transfusion, Cancer ([place]), Cardiac tamponade, recurrent episode, admitted 9/5, but now felt to be pericarditits (01/17/2012), Complete heart block ([place]) (01/23/2016), COPD (chronic obstructive pulmonary disease) ([place]), Diverticulosis of colon (without mention of hemorrhage),  Dysfunction of eustachian tube, Fibromuscular dysplasia ([place]), GERD (gastroesophageal reflux disease), Head injury, acute, with loss of consciousness ([place]) (1987), Heart murmur, Hiatal hernia, HTN (hypertension), Hyperlipidemia, Pacemaker, PAF (paroxysmal atrial fibrillation), after a. fib ablation at [facility], now with RVR (01/05/2012), Persistent atrial fibrillation ([place]), Personal history of colonic polyps, Pneumonia, Shortness of breath, and Valvular heart disease.  has a past surgical history that includes Cholecystectomy; Hemorroidectomy; External fixation wrist fracture (1998); Knee arthroscopy (Left, 02/2011); Tumor excision (Left, 09/2008); Tonsillectomy; TEE without cardioversion (04/12/2011); Cardioversion (04/12/2011); Cardiac electrophysiology mapping and ablation; Cardiac catheterization; Pacemaker insertion (04/22/2012); US ECHOCARDIOGRAPHY (02/07/2012); NM MYOVIEW LTD (11/20/2010); Eye surgery (Right); ERCP (N/A, 06/23/2013); left heart catheterization with coronary angiogram (N/A, 09/04/2011); right heart catheterization (N/A, 01/17/2012); Fracture surgery; Cataract extraction w/ intraocular lens  implant, bilateral (Bilateral); Cardiac catheterization (N/A, 04/24/2015); ERCP (N/A, 04/09/2017); Knee Arthroplasty; IR Radiologist Eval & Mgmt (05/09/2017); and IR KYPHO LUMBAR INC FX REDUCE BONE BX UNI/BIL CANNULATION INC/IMAGING (05/16/2017). Prior to Admission medications   Medication Sig Start Date End Date Taking? Authorizing Provider  Calcium Carbonate-Vitamin D (CALCIUM + D PO) Take 1 tablet by mouth 2 (two) times daily.   Yes [provider]  dabigatran (PRADAXA) 75 MG CAPS capsule TAKE ONE CAPSULE BY MOUTH EVERY 12 HOURS 07/25/17  Yes Croitoru, Mihai, MD  fluticasone (FLONASE) 50 MCG/ACT nasal spray Place 1 spray into both nostrils daily as needed for allergies or rhinitis.   Yes [provider]  furosemide (LASIX) 40 MG tablet Take 1 tablet (40 mg total) by mouth daily. 06/04/17   Yes Croitoru, Mihai, MD  loratadine (CLARITIN) 10 MG tablet Take 10 mg by mouth daily as needed for allergies.    Yes [provider]  magnesium oxide (MAG-OX) 400 (  241.3 Mg) MG tablet Take 1 tablet (400 mg total) by mouth daily. 08/18/17  Yes Croitoru, Mihai, MD  metoprolol succinate (TOPROL-XL) 100 MG 24 hr tablet TAKE 1 TABLET BY MOUTH DAILY WITH OR IMMEDIATELY FOLLOWING A MEAL 08/18/17  Yes Croitoru, Mihai, MD  Multiple Vitamins-Minerals (MULTIVITAMINS THER. W/MINERALS) TABS Take 1 tablet by mouth every morning.     Yes [provider]  Multiple Vitamins-Minerals (PRESERVISION AREDS 2) CAPS Take 1 capsule by mouth 2 (two) times daily.    Yes [provider]  Omega-3 Fatty Acids (FISH OIL) 1000 MG CAPS Take 1,000 mg by mouth daily.    Yes [provider]  Polyethyl Glycol-Propyl Glycol (SYSTANE OP) Place 2 drops into both eyes 2 (two) times daily as needed (for dry eyes).   Yes [provider]  potassium chloride (K-DUR,KLOR-CON) 10 MEQ tablet Take 10 mEq by mouth daily.   Yes [provider]  ranitidine (ZANTAC) 150 MG tablet Take 150 mg by mouth daily.   Yes [provider]  rosuvastatin (CRESTOR) 5 MG tablet Take 1 tablet (5 mg total) by mouth every evening. 06/10/13  Yes Croitoru, Dani Gobble, MD   Allergies  Allergen Reactions  . Tramadol Other (See Comments)    Medication made her feel "off and talk to herself"  . Protonix [Pantoprazole Sodium] Other (See Comments)    Abdominal pain    FAMILY HISTORY:  family history includes Arthritis in her sister; Atrial fibrillation in her sister; Heart disease in her mother; Heart failure in her father; Prostate cancer in her father. SOCIAL HISTORY:  reports that she quit smoking about 35 years ago. Her smoking use included cigarettes. She has a 25.00 pack-year smoking history. She has never used smokeless tobacco. She reports that she does not drink alcohol or use drugs.  REVIEW OF SYSTEMS:    Bolds are positive  Constitutional: weight loss, gain, night sweats, Fevers, chills, fatigue .  HEENT: headaches, Sore throat, sneezing, nasal congestion, post nasal drip, Difficulty swallowing, Tooth/dental problems, visual complaints visual changes, ear ache CV:  chest pain, radiates:back,Orthopnea, PND, swelling in lower extremities, dizziness, palpitations, syncope.  GI  heartburn, indigestion, abdominal pain, nausea, vomiting, diarrhea, change in bowel habits, loss of appetite, bloody stools.  Resp: cough, productive: , hemoptysis, dyspnea, chest pain, pleuritic.  Skin: rash or itching or icterus GU: dysuria, change in color of urine, urgency or frequency. flank pain, hematuria  MS: joint pain or swelling. decreased range of motion  Psych: change in mood or affect. depression or anxiety.  Neuro: difficulty with speech, weakness, numbness, ataxia    SUBJECTIVE:   VITAL SIGNS: Temp:  [97.9 F (36.6 C)] 97.9 F (36.6 C) (06/13 1733) Pulse Rate:  [70-75] 70 (06/13 2145) Resp:  [12-22] 22 (06/13 2145) BP: (134-155)/(66-98) 154/66 (06/13 2145) SpO2:  [96 %-99 %] 98 % (06/13 2145) Weight:  [64.4 kg (142 lb)] 64.4 kg (142 lb) (06/13 2230)  PHYSICAL EXAMINATION: General:  Thin 85 year old female in NAD Neuro:  Alert, oriented, non-focal. Hard of hearing HEENT:  [place]/AT, PERRL, no JVD Cardiovascular:  Paced, regular.  Lungs:  Clear Abdomen:  Soft, non-tender, non-distended Musculoskeletal:  No acute deformity or ROM limitation Skin:  Grossly intact.   Recent Labs  Lab 10/23/17 1737  NA 143  K 5.1  CL 106  CO2 29  BUN 19  CREATININE 1.29*  GLUCOSE 104*   Recent Labs  Lab 10/23/17 1737  HGB 15.6*  HCT 49.2*  WBC 7.5  PLT  152   Dg Chest 2 View  Result Date: 10/23/2017 CLINICAL DATA:  Chest pain EXAM: CHEST - 2 VIEW COMPARISON:  07/28/2016 FINDINGS: Left-sided pacing device as before. Coils in the epigastric region and right upper quadrant. Hyperinflation with minimal  atelectasis or scar at the bases. No acute consolidation or effusion. Stable cardiomediastinal silhouette with borderline cardiomegaly. Aortic atherosclerosis. No pneumothorax. Probable old right seventh rib fracture. IMPRESSION: 1. No radiographic evidence for acute cardiopulmonary abnormality. 2. Borderline cardiomegaly. Minimal scarring or atelectasis at the bases. Electronically Signed   By: Donavan Foil M.D.   On: 10/23/2017 18:18   Ct Angio Chest/abd/pel For Dissection W And/or Wo Contrast  Result Date: 10/23/2017 CLINICAL DATA:  Chest pain.  Acute aortic syndrome suspected. EXAM: CT ANGIOGRAPHY CHEST, ABDOMEN AND PELVIS TECHNIQUE: Multidetector CT imaging through the chest, abdomen and pelvis was performed using the standard protocol during bolus administration of intravenous contrast. Multiplanar reconstructed images and MIPs were obtained and reviewed to evaluate the vascular anatomy. CONTRAST:  130mL ISOVUE-370 IOPAMIDOL (ISOVUE-370) INJECTION 76% COMPARISON:  Chest radiograph earlier this day. Dissection protocol chest CT 10/18/2009 FINDINGS: CTA CHEST FINDINGS Cardiovascular: Aneurysmal ascending aorta measuring 4.2 cm. Mild undersurface plaque of the transverse aorta without frank ulceration. There is irregular intraluminal low-density involving the distal arch distal to the takeoff of the great vessels with heterogeneous low-density that extends throughout the descending aorta, intermittently visualized to varying degrees. Ill-defined flap is not visualized. There is diffuse atherosclerotic calcifications. No filling defects in the pulmonary arteries to suggest pulmonary embolus. There is mild cardiomegaly with right greater than left heart dilatation. Contrast refluxes into the hepatic veins which are dilated. Pacemaker wires in place. No pericardial effusion. No evidence of aortic hematoma or penetrating aortic ulcer. Mediastinum/Nodes: No enlarged mediastinal or hilar lymph nodes. Esophagus is  decompressed. No thyroid nodule. Lungs/Pleura: No consolidation, pleural effusion or pulmonary edema. Minimal central bronchial thickening. No pulmonary mass. Musculoskeletal: Prominent Schmorl's node superior endplate of T5. No acute osseous abnormalities. No focal osseous lesion. 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 Review of the MIP images confirms the above findings. CTA ABDOMEN AND PELVIS FINDINGS VASCULAR Aorta: Infrarenal aneurysm measures 4.7 x 4.0 cm with peripheral eccentric thrombus. No periaortic stranding. Irregular intraluminal low-density in the descending thoracic aorta conitues in the abdominal aorta and is faintly visualized in the proximal aneurysm and mid aneurysm body. No periaortic stranding or inflammation. Celiac: Small in caliber with stenosis at the origin due to calcified and noncalcified atheromatous plaque. No evidence of dissection. SMA: Mild plaque at the origin without significant stenosis. No dissection. Incidental replaced right hepatic artery. Renals: Plaque at the origin of both renal arteries with mild stenosis. Slight beading of bilateral renal arteries which may be tortuosity or can be seen with fibromuscular dysplasia. IMA: Patent arising from the aneurysmal aorta. Inflow: Moderate tortuosity and atherosclerotic calcifications. No dissection visualized. No aneurysm. Veins: Contrast refluxes into the hepatic veins and IVC which are dilated. Review of the MIP images confirms the above findings. NON-VASCULAR Hepatobiliary: Pneumobilia in the left greater than right lobe likely postprocedural, patient with history of ERCP. Surgical clips from prior cholecystectomy, clips or coils also extends along the left lobe. No discrete focal lesion. Pancreas: No ductal dilatation or inflammation. Spleen: Normal in size.  Normal arterial phase enhancement. Adrenals/Urinary Tract: Unchanged low-density  thickening of the left adrenal gland. Right adrenal gland is normal. There is homogeneous renal enhancement.  RFA defect from the posterolateral left kidney is unchanged in appearance. No hydronephrosis. Urinary bladder is completely nondistended. Stomach/Bowel: Diffuse colonic diverticulosis without diverticulitis. No bowel dilatation, inflammation or evident wall thickening. Appendix not confidently visualized. Lymphatic: No enlarged abdominal or pelvic lymph nodes. Reproductive: Uterus and bilateral adnexa are unremarkable. Other: No free air, free fluid, or intra-abdominal fluid collection. Musculoskeletal: L3 compression fracture with kyphoplasty. Remote bilateral inferior and superior pubic rami fractures, probable remote bilateral sacral fractures. The bones are under mineralized. Review of the MIP images confirms the above findings. IMPRESSION: 1. Findings suspicious for atypical/early type B aortic dissection arising distal to the takeoff of the great vessels in the transverse aorta. A well-defined flap is not visualized, however there is heterogeneous intraluminal low-density with heterogeneous flow extending throughout the descending aorta and involving the abdominal aorta in the expected distribution of aortic dissection. Follow-up CTA could be considered. 2. Aneurysmal dilatation of the ascending aorta, maximal dimension 4.2 cm. Infrarenal aortic aneurysm measuring 4.7 x 4.0 cm without evidence of rupture. 3. Diffuse thoracoabdominal aortic atherosclerosis. 4. Mild cardiomegaly with primarily right heart dilatation and contrast refluxing into the hepatic veins and IVC consistent with elevated right heart pressures. 5. Pneumobilia in the left greater than right liver likely postprocedural, patient with history of ERCP. 6. 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 Critical Value/emergent results were called  by telephone at the time of interpretation on 10/23/2017 at 10:14 pm to Dr. Pattricia Boss , who verbally acknowledged these results. Electronically Signed   By: Jeb Levering M.D.   On: 10/23/2017 22:14    ASSESSMENT / PLAN:  Type B aortic dissection - Vascular surgery has seen, no plans for procedure. Poor candidate. Doubt she would want a surgery.  - Strict BP management: will start off with labetalol infusion. SBP goal 100 to 120 if she will tolerate this.  - Admit to ICU for telemetry monitoring and drip titration.  - Will order her home lasix and metoprolol for the morning - Would consult cardiology 6/14 to help her establish antihypertensive regimen. She sees Dr. Sallyanne Kuster  Chronic Atrial fibrillation - Pacemaker in place - Continue home dabigatran  COPD without acute exacerbation - PRN xopenex  GERD - Continue home ranitidine  Hyperlipidemia - Continue home rosuvastatin  Called son Merry Proud and updated him via phone  Long conversation with patient regarding goals of care. She would never want machines keeping her alive. No CPR or Shocks. Would be ok with very short term intubation if we felt the reason for intubation was reversible.   Georgann Housekeeper, AGACNP-BC [facility] Pulmonology/Critical Care Pager [phone] or [phone]  10/23/2017 11:38 PM

## 2017-10-23 NOTE — ED Notes (Signed)
Patient returned to unit. 

## 2017-10-23 NOTE — ED Notes (Signed)
Patient requested that her family be called and updated. Spoke with son Mosetta Anis would like to receive call on updates at [phone]

## 2017-10-24 ENCOUNTER — Other Ambulatory Visit: Payer: Self-pay | Admitting: Cardiology

## 2017-10-24 DIAGNOSIS — I7102 Dissection of abdominal aorta: Principal | ICD-10-CM

## 2017-10-24 LAB — BASIC METABOLIC PANEL
Anion gap: 6 (ref 5–15)
BUN: 16 mg/dL (ref 6–20)
CHLORIDE: 110 mmol/L (ref 101–111)
CO2: 25 mmol/L (ref 22–32)
CREATININE: 0.94 mg/dL (ref 0.44–1.00)
Calcium: 8.7 mg/dL — ABNORMAL LOW (ref 8.9–10.3)
GFR calc non Af Amer: 52 mL/min — ABNORMAL LOW (ref >60)
Glucose, Bld: 100 mg/dL — ABNORMAL HIGH (ref 65–99)
Potassium: 3.5 mmol/L (ref 3.5–5.1)
Sodium: 141 mmol/L (ref 135–145)

## 2017-10-24 LAB — CBC
HEMATOCRIT: 40.4 % (ref 36.0–46.0)
HEMOGLOBIN: 13.4 g/dL (ref 12.0–15.0)
MCH: 31.6 pg (ref 26.0–34.0)
MCHC: 33.2 g/dL (ref 30.0–36.0)
MCV: 95.3 fL (ref 78.0–100.0)
Platelets: 130 10*3/uL — ABNORMAL LOW (ref 150–400)
RBC: 4.24 MIL/uL (ref 3.87–5.11)
RDW: 13.3 % (ref 11.5–15.5)
WBC: 7.2 10*3/uL (ref 4.0–10.5)

## 2017-10-24 LAB — MRSA PCR SCREENING: MRSA by PCR: NEGATIVE

## 2017-10-24 LAB — MAGNESIUM: Magnesium: 2.2 mg/dL (ref 1.7–2.4)

## 2017-10-24 LAB — PHOSPHORUS: PHOSPHORUS: 4 mg/dL (ref 2.5–4.6)

## 2017-10-24 MED ORDER — PRESERVISION AREDS 2 PO CAPS: 1.0000 | ORAL_CAPSULE | Freq: Two times a day (BID) | ORAL | Status: DC

## 2017-10-24 MED ORDER — CALCIUM CARBONATE-VITAMIN D 500-200 MG-UNIT PO TABS
1.0000 | ORAL_TABLET | Freq: Two times a day (BID) | ORAL | Status: DC
  Administered 2017-10-24 – 2017-10-27 (×7): 1 via ORAL
  Filled 2017-10-24 (×7): qty 1

## 2017-10-24 MED ORDER — METOPROLOL TARTRATE 5 MG/5ML IV SOLN: 2.5000 mg | Freq: Four times a day (QID) | INTRAVENOUS | Status: DC | PRN

## 2017-10-24 MED ORDER — THERA M PLUS PO TABS: 1.0000 | ORAL_TABLET | ORAL | Status: DC

## 2017-10-24 MED ORDER — CALCIUM CITRATE-VITAMIN D 315-200 MG-UNIT PO TABS: 1.0000 | ORAL_TABLET | Freq: Two times a day (BID) | ORAL | Status: DC

## 2017-10-24 MED ORDER — POTASSIUM CHLORIDE CRYS ER 20 MEQ PO TBCR
40.0000 meq | EXTENDED_RELEASE_TABLET | Freq: Once | ORAL | Status: AC
  Administered 2017-10-24: 40 meq via ORAL
  Filled 2017-10-24: qty 2
  Filled 2017-10-24: qty 1

## 2017-10-24 MED ORDER — ADULT MULTIVITAMIN W/MINERALS CH
1.0000 | ORAL_TABLET | Freq: Every day | ORAL | Status: DC
  Administered 2017-10-24 – 2017-10-27 (×4): 1 via ORAL
  Filled 2017-10-24 (×4): qty 1

## 2017-10-24 MED ORDER — FLUTICASONE PROPIONATE 50 MCG/ACT NA SUSP
1.0000 | Freq: Every day | NASAL | Status: DC | PRN
  Filled 2017-10-24: qty 16

## 2017-10-24 NOTE — Progress Notes (Signed)
Pt declined ABG statinng that she just wanted to rest.

## 2017-10-24 NOTE — Progress Notes (Signed)
Asked to comment on patient's medical therapy in regards to blood pressure management.  Patient has a history of chronic atrial fibrillation and is on Pradaxa, complete heart block status post permanent pacemaker placement, mild aortic stenosis/moderate AR by echocardiogram earlier this year admitted with a type B aortic dissection.  CCM's goal for blood pressure is 100 to 120 mmHg with heart rate less than 100 bpm.  She was on IV Labetolol gtt but this has been stopped.  She is also on lopressor 50 mg twice daily.  Her home blood pressure medicine includes Toprol-XL 100 mg daily.  Would keep her off Labetolol since most of her SBP readings are from 99-174mmHg.  Can use PRN Lopressor 2.5mg  IV q6hours for SBP>140.  Would eventually get patient back on her home does of Toprol as BP allows.  Please call with any questions.

## 2017-10-24 NOTE — ED Notes (Signed)
Patient offered toileting and moved to hospital bed.

## 2017-10-24 NOTE — Progress Notes (Signed)
   Daily Progress Note   Assessment/Planning:   PAU/IMH vs Type B Aortic dissection   No evidence of malperfusion currently  Doubt pt is open surgical candidate.  TEVAR placement might also be challenging given her tortuous and heavily calcified aortoiliac segments  Maximal medical mgmt: currently on labetolol   Subjective  - * No surgery found *   No complaints   Objective   Vitals:   10/24/17 0731 10/24/17 0800 10/24/17 0815 10/24/17 0830  BP: (!) 89/59 (!) 87/61 104/70 112/73  Pulse: 71 70 70 70  Resp: (!) 29 11 (!) 23 (!) 21  Temp:    (!) 97.5 F (36.4 C)  TempSrc:    Oral  SpO2: 91% 96% 94% 97%  Weight:      Height:         Intake/Output Summary (Last 24 hours) at 10/24/2017 0842 Last data filed at 10/24/2017 0732 Gross per 24 hour  Intake 565.5 ml  Output 175 ml  Net 390.5 ml    PULM  CTAB  CV  RRR  GI  soft, NTND  VASC Palpable pulses throughout  NEURO A&O x 3    Laboratory   CBC CBC Latest Ref Rng & Units 10/24/2017 10/23/2017 05/16/2017  WBC 4.0 - 10.5 K/uL 7.2 7.5 6.6  Hemoglobin 12.0 - 15.0 g/dL 13.4 15.6(H) 13.0  Hematocrit 36.0 - 46.0 % 40.4 49.2(H) 40.1  Platelets 150 - 400 K/uL 130(L) 152 138(L)    BMET    Component Value Date/Time   NA 141 10/24/2017 0625   NA 144 07/15/2017 1052   K 3.5 10/24/2017 0625   CL 110 10/24/2017 0625   CO2 25 10/24/2017 0625   GLUCOSE 100 (H) 10/24/2017 0625   BUN 16 10/24/2017 0625   BUN 20 07/15/2017 1052   CREATININE 0.94 10/24/2017 0625   CREATININE 0.98 (H) 08/28/2016 1343   CALCIUM 8.7 (L) 10/24/2017 0625   GFRNONAA 52 (L) 10/24/2017 0625   GFRAA >60 10/24/2017 4174     Adele Barthel, MD, FACS [facility] Office: [phone] Pager: [phone]  10/24/2017, 8:42 AM

## 2017-10-24 NOTE — Progress Notes (Signed)
PCCM Progress Note  Admission date: 10/23/2017 Referring provider: Dr. Jeanell Sparrow, ER  CC: chest pain  HPI: 84 yo female former smoker presented with sudden onset of chest pain and dyspnea.  Found to have type B aortic dissection.  PMHx of A fib on pradaxa, CHB s/p PM, COPD, HTN, AAA, pericarditis, GERD, HLD  Subjective: Chest pain better.  Not feeling short of breath anymore.  Tolerated diet.  Vital signs: BP 112/72   Pulse 70   Temp (!) 97.5 F (36.4 C) (Oral)   Resp 20   Ht 5\' 2"  (1.575 m)   Wt 138 lb 3.7 oz (62.7 kg)   SpO2 97%   BMI 25.28 kg/m   Intake/outpt: I/O last 3 completed shifts: In: 561.5 [I.V.:561.5] Out: 175 [Urine:175]  Physical exam:  General - pleasant Eyes - pupils reactive ENT - no sinus tenderness, no oral exudate, no LAN Cardiac - regular, 2/6 systolic murmur Chest - no wheeze, rales Abd - soft, non tender Ext - no edema Skin - no rashes Neuro - normal strength Psych - normal mood  Labs: CBC Recent Labs    10/23/17 1737 10/24/17 0625  WBC 7.5 7.2  HGB 15.6* 13.4  HCT 49.2* 40.4  PLT 152 130*    Coag's Recent Labs    10/23/17 1737  INR 1.24    BMET Recent Labs    10/23/17 1737 10/24/17 0625  NA 143 141  K 5.1 3.5  CL 106 110  CO2 29 25  BUN 19 16  CREATININE 1.29* 0.94  GLUCOSE 104* 100*    Electrolytes Recent Labs    10/23/17 1737 10/24/17 0625  CALCIUM 10.1 8.7*  MG  --  2.2  PHOS  --  4.0    Sepsis Markers No results for input(s): PROCALCITON, O2SATVEN in the last 72 hours.  Invalid input(s): LACTICACIDVEN  ABG No results for input(s): PHART, PCO2ART, PO2ART in the last 72 hours.  Liver Enzymes No results for input(s): AST, ALT, ALKPHOS, BILITOT, ALBUMIN in the last 72 hours.  Cardiac Enzymes No results for input(s): TROPONINI, PROBNP in the last 72 hours.  Glucose No results for input(s): GLUCAP in the last 72 hours.  Imaging Dg Chest 2 View  Result Date: 10/23/2017 CLINICAL DATA:  Chest pain  EXAM: CHEST - 2 VIEW COMPARISON:  07/28/2016 FINDINGS: Left-sided pacing device as before. Coils in the epigastric region and right upper quadrant. Hyperinflation with minimal atelectasis or scar at the bases. No acute consolidation or effusion. Stable cardiomediastinal silhouette with borderline cardiomegaly. Aortic atherosclerosis. No pneumothorax. Probable old right seventh rib fracture. IMPRESSION: 1. No radiographic evidence for acute cardiopulmonary abnormality. 2. Borderline cardiomegaly. Minimal scarring or atelectasis at the bases. Electronically Signed   By: Donavan Foil M.D.   On: 10/23/2017 18:18   Ct Angio Chest/abd/pel For Dissection W And/or Wo Contrast  Result Date: 10/23/2017 CLINICAL DATA:  Chest pain.  Acute aortic syndrome suspected. EXAM: CT ANGIOGRAPHY CHEST, ABDOMEN AND PELVIS TECHNIQUE: Multidetector CT imaging through the chest, abdomen and pelvis was performed using the standard protocol during bolus administration of intravenous contrast. Multiplanar reconstructed images and MIPs were obtained and reviewed to evaluate the vascular anatomy. CONTRAST:  14mL ISOVUE-370 IOPAMIDOL (ISOVUE-370) INJECTION 76% COMPARISON:  Chest radiograph earlier this day. Dissection protocol chest CT 10/18/2009 FINDINGS: CTA CHEST FINDINGS Cardiovascular: Aneurysmal ascending aorta measuring 4.2 cm. Mild undersurface plaque of the transverse aorta without frank ulceration. There is irregular intraluminal low-density involving the distal arch distal to the takeoff of the  great vessels with heterogeneous low-density that extends throughout the descending aorta, intermittently visualized to varying degrees. Ill-defined flap is not visualized. There is diffuse atherosclerotic calcifications. No filling defects in the pulmonary arteries to suggest pulmonary embolus. There is mild cardiomegaly with right greater than left heart dilatation. Contrast refluxes into the hepatic veins which are dilated. Pacemaker  wires in place. No pericardial effusion. No evidence of aortic hematoma or penetrating aortic ulcer. Mediastinum/Nodes: No enlarged mediastinal or hilar lymph nodes. Esophagus is decompressed. No thyroid nodule. Lungs/Pleura: No consolidation, pleural effusion or pulmonary edema. Minimal central bronchial thickening. No pulmonary mass. Musculoskeletal: Prominent Schmorl's node superior endplate of T5. No acute osseous abnormalities. No focal osseous lesion. 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 Review of the MIP images confirms the above findings. CTA ABDOMEN AND PELVIS FINDINGS VASCULAR Aorta: Infrarenal aneurysm measures 4.7 x 4.0 cm with peripheral eccentric thrombus. No periaortic stranding. Irregular intraluminal low-density in the descending thoracic aorta conitues in the abdominal aorta and is faintly visualized in the proximal aneurysm and mid aneurysm body. No periaortic stranding or inflammation. Celiac: Small in caliber with stenosis at the origin due to calcified and noncalcified atheromatous plaque. No evidence of dissection. SMA: Mild plaque at the origin without significant stenosis. No dissection. Incidental replaced right hepatic artery. Renals: Plaque at the origin of both renal arteries with mild stenosis. Slight beading of bilateral renal arteries which may be tortuosity or can be seen with fibromuscular dysplasia. IMA: Patent arising from the aneurysmal aorta. Inflow: Moderate tortuosity and atherosclerotic calcifications. No dissection visualized. No aneurysm. Veins: Contrast refluxes into the hepatic veins and IVC which are dilated. Review of the MIP images confirms the above findings. NON-VASCULAR Hepatobiliary: Pneumobilia in the left greater than right lobe likely postprocedural, patient with history of ERCP. Surgical clips from prior cholecystectomy, clips or coils also extends along the left lobe. No  discrete focal lesion. Pancreas: No ductal dilatation or inflammation. Spleen: Normal in size.  Normal arterial phase enhancement. Adrenals/Urinary Tract: Unchanged low-density thickening of the left adrenal gland. Right adrenal gland is normal. There is homogeneous renal enhancement. RFA defect from the posterolateral left kidney is unchanged in appearance. No hydronephrosis. Urinary bladder is completely nondistended. Stomach/Bowel: Diffuse colonic diverticulosis without diverticulitis. No bowel dilatation, inflammation or evident wall thickening. Appendix not confidently visualized. Lymphatic: No enlarged abdominal or pelvic lymph nodes. Reproductive: Uterus and bilateral adnexa are unremarkable. Other: No free air, free fluid, or intra-abdominal fluid collection. Musculoskeletal: L3 compression fracture with kyphoplasty. Remote bilateral inferior and superior pubic rami fractures, probable remote bilateral sacral fractures. The bones are under mineralized. Review of the MIP images confirms the above findings. IMPRESSION: 1. Findings suspicious for atypical/early type B aortic dissection arising distal to the takeoff of the great vessels in the transverse aorta. A well-defined flap is not visualized, however there is heterogeneous intraluminal low-density with heterogeneous flow extending throughout the descending aorta and involving the abdominal aorta in the expected distribution of aortic dissection. Follow-up CTA could be considered. 2. Aneurysmal dilatation of the ascending aorta, maximal dimension 4.2 cm. Infrarenal aortic aneurysm measuring 4.7 x 4.0 cm without evidence of rupture. 3. Diffuse thoracoabdominal aortic atherosclerosis. 4. Mild cardiomegaly with primarily right heart dilatation and contrast refluxing into the hepatic veins and IVC consistent with elevated right heart pressures. 5. Pneumobilia in the left greater than right liver likely postprocedural, patient with history of ERCP. 6. 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 Critical Value/emergent results were called by telephone at the time of interpretation on 10/23/2017 at 10:14 pm to Dr. Pattricia Boss , who verbally acknowledged these results. Electronically Signed   By: Jeb Levering M.D.   On: 10/23/2017 22:14   Studies: Echo 06/12/17 >> EF 65 to 70%, mild AS, mod AR, mild MR, PAS 32 mmHg CT angio chest 6/13 >> type B aortic dissection, aneurysmal dilation of ascending aorta 4.2 cm, atherosclerosis  Events: 6/13 Admit, VVS consulted 6/14 consult cardiology  Assessment/plan:  Type B aortic dissection. - goal SBP 100 to 120, HR < 100  Chronic A fib, HTN, chronic diastolic CHF, aortic stenosis, CHB s/p PM, HLD. - will ask cardiology to assist with management  Hx of COPD - prn xopenex  DVT prophylaxis - pradaxa SUP - pepcid Nutrition - heart healthy diet Goals of care - no CPR/no defibrillation.  Okay with short term intubation if needed  Updated family at bedside  CC time 41 minutes  Chesley Mires, MD [place] 10/24/2017, 10:43 AM

## 2017-10-25 ENCOUNTER — Other Ambulatory Visit: Payer: Self-pay

## 2017-10-25 DIAGNOSIS — E7849 Other hyperlipidemia: Secondary | ICD-10-CM

## 2017-10-25 DIAGNOSIS — I10 Essential (primary) hypertension: Secondary | ICD-10-CM

## 2017-10-25 DIAGNOSIS — I5032 Chronic diastolic (congestive) heart failure: Secondary | ICD-10-CM

## 2017-10-25 DIAGNOSIS — I482 Chronic atrial fibrillation: Secondary | ICD-10-CM

## 2017-10-25 DIAGNOSIS — J431 Panlobular emphysema: Secondary | ICD-10-CM

## 2017-10-25 DIAGNOSIS — I442 Atrioventricular block, complete: Secondary | ICD-10-CM

## 2017-10-25 LAB — CBC
HCT: 41.5 % (ref 36.0–46.0)
HEMOGLOBIN: 13.4 g/dL (ref 12.0–15.0)
MCH: 30.9 pg (ref 26.0–34.0)
MCHC: 32.3 g/dL (ref 30.0–36.0)
MCV: 95.8 fL (ref 78.0–100.0)
Platelets: 124 10*3/uL — ABNORMAL LOW (ref 150–400)
RBC: 4.33 MIL/uL (ref 3.87–5.11)
RDW: 13.4 % (ref 11.5–15.5)
WBC: 8.4 10*3/uL (ref 4.0–10.5)

## 2017-10-25 LAB — BASIC METABOLIC PANEL
Anion gap: 8 (ref 5–15)
BUN: 16 mg/dL (ref 6–20)
CALCIUM: 9 mg/dL (ref 8.9–10.3)
CO2: 24 mmol/L (ref 22–32)
CREATININE: 0.96 mg/dL (ref 0.44–1.00)
Chloride: 108 mmol/L (ref 101–111)
GFR calc Af Amer: 59 mL/min — ABNORMAL LOW (ref >60)
GFR calc non Af Amer: 51 mL/min — ABNORMAL LOW (ref >60)
GLUCOSE: 91 mg/dL (ref 65–99)
Potassium: 4.1 mmol/L (ref 3.5–5.1)
Sodium: 140 mmol/L (ref 135–145)

## 2017-10-25 MED ORDER — METOPROLOL TARTRATE 50 MG PO TABS
62.5000 mg | ORAL_TABLET | Freq: Two times a day (BID) | ORAL | Status: DC
  Administered 2017-10-25: 62.5 mg via ORAL
  Filled 2017-10-25: qty 1

## 2017-10-25 NOTE — Progress Notes (Signed)
PROGRESS NOTE    [name]  BHA:[id number] DOB: [birth year] DOA: 10/23/2017 PCP: Merrilee Seashore, MD   Brief Narrative:  84 year old WF PMHx Atrial fibrillation on pradaxa, Complete heart block s/p Pacemaker, COPD, HTN, Abdominal aortic aneurysm,  Cardiac tamponade, recurrent episode, admitted 9/5,Complete heart block (01/23/2016),pericarditits (01/17/2012),Heart murmur, COPD,Diverticulosis of colon,  Fibromuscular dysplasia  Developed chest pain and mild SOB. She reports these symptoms off and on for years, but now they were more severe and more constant. Chest pain was described as left sternal with radiation to the back. Her husband convinced her to present to the ED 6/13.      Subjective: 6/15 A/O x4, negative CP, negative S OB, negative abdominal pain.  Appears much younger than stated age.  States ambulated around the ward today negative dizziness, negative fatigue.  CP   Assessment & Plan:   Active Problems:   Aortic aneurysm (HCC)   Type B Aortic Dissection. -Goal SBP 100 to 120, HR < 100 -See CHF   Chronic Diastolic CHF -DC labetalol per cardiology - 6/15 increase Metoprolol 62.5 mg  BID(Toprol XL 100 mg daily home dose)  - Lasix 40 mg daily -Metoprolol IV PRN SBP > 140 -Strict in and out -Daily weight - Transfuse for hemoglobin<8  Chronic Atrial Fibrillation -Pradaxa 75 mg BID -Currently NSR (paced) -See CHF  Essential HTN -See CHF  Complete heart block -S/P Pacer placement - see CHF  COPD -Xopenex PRN  HLD -Crestor 5 mg daily - Lipid panel pending     DVT prophylaxis: Pradaxa Code Status: Partial  Family Communication: None Disposition Plan: TBD   Consultants:  Vascular surgery PCCM Cardiology     Procedures/Significant Events:  Echo 06/12/17 >> EF 65 to 70%, mild AS, mod AR, mild MR, PAS 32 mmHg CT angio chest 6/13 >> type B aortic dissection, aneurysmal dilation of ascending aorta 4.2 cm, atherosclerosis  6/13 Admit, VVS  consulted 6/14 consult cardiology  I have personally reviewed and interpreted all radiology studies and my findings are as above.  VENTILATOR SETTINGS:    Cultures   Antimicrobials:    Devices    LINES / TUBES:      Continuous Infusions: . sodium chloride    . labetalol (NORMODYNE) infusion Stopped (10/24/17 1530)     Objective: Vitals:   10/25/17 0500 10/25/17 0612 10/25/17 0700 10/25/17 0730  BP: (!) 117/59 108/63 102/70 102/70  Pulse: 70 70 70 70  Resp: (!) 24 18 (!) 25 14  Temp:      TempSrc:      SpO2: 97% 96% 98% 96%  Weight: 138 lb 3.7 oz (62.7 kg)     Height:        Intake/Output Summary (Last 24 hours) at 10/25/2017 0807 Last data filed at 10/25/2017 0600 Gross per 24 hour  Intake 600.63 ml  Output 1575 ml  Net -974.37 ml   Filed Weights   10/23/17 2230 10/24/17 0235 10/25/17 0500  Weight: 142 lb (64.4 kg) 138 lb 3.7 oz (62.7 kg) 138 lb 3.7 oz (62.7 kg)    Examination:  General: A/O x4, No acute respiratory distress, appears much younger than stated age Neck:  Negative scars, masses, torticollis, lymphadenopathy, JVD Lungs: Clear to auscultation bilaterally without wheezes or crackles Cardiovascular: (Paced) regular rate and rhythm without murmur gallop or rub normal S1 and S2 Abdomen: negative abdominal pain, nondistended, positive soft, bowel sounds, no rebound, no ascites, no appreciable mass Extremities: No significant cyanosis, clubbing, or edema bilateral lower  extremities Skin: Negative rashes, lesions, ulcers Psychiatric:  Negative depression, negative anxiety, negative fatigue, negative mania  Central nervous system:  Cranial nerves II through XII intact, tongue/uvula midline, all extremities muscle strength 5/5, sensation intact throughout,  negative dysarthria, negative expressive aphasia, negative receptive aphasia.  .     Data Reviewed: Care during the described time interval was provided by me .  I have reviewed this  patient's available data, including medical history, events of note, physical examination, and all test results as part of my evaluation.   CBC: Recent Labs  Lab 10/23/17 1737 10/24/17 0625 10/25/17 0511  WBC 7.5 7.2 8.4  HGB 15.6* 13.4 13.4  HCT 49.2* 40.4 41.5  MCV 99.0 95.3 95.8  PLT 152 130* 518*   Basic Metabolic Panel: Recent Labs  Lab 10/23/17 1737 10/24/17 0625 10/25/17 0511  NA 143 141 140  K 5.1 3.5 4.1  CL 106 110 108  CO2 29 25 24   GLUCOSE 104* 100* 91  BUN 19 16 16   CREATININE 1.29* 0.94 0.96  CALCIUM 10.1 8.7* 9.0  MG  --  2.2  --   PHOS  --  4.0  --    GFR: Estimated Creatinine Clearance: 33.9 mL/min (by C-G formula based on SCr of 0.96 mg/dL). Liver Function Tests: No results for input(s): AST, ALT, ALKPHOS, BILITOT, PROT, ALBUMIN in the last 168 hours. No results for input(s): LIPASE, AMYLASE in the last 168 hours. No results for input(s): AMMONIA in the last 168 hours. Coagulation Profile: Recent Labs  Lab 10/23/17 1737  INR 1.24   Cardiac Enzymes: No results for input(s): CKTOTAL, CKMB, CKMBINDEX, TROPONINI in the last 168 hours. BNP (last 3 results) No results for input(s): PROBNP in the last 8760 hours. HbA1C: No results for input(s): HGBA1C in the last 72 hours. CBG: No results for input(s): GLUCAP in the last 168 hours. Lipid Profile: No results for input(s): CHOL, HDL, LDLCALC, TRIG, CHOLHDL, LDLDIRECT in the last 72 hours. Thyroid Function Tests: No results for input(s): TSH, T4TOTAL, FREET4, T3FREE, THYROIDAB in the last 72 hours. Anemia Panel: No results for input(s): VITAMINB12, FOLATE, FERRITIN, TIBC, IRON, RETICCTPCT in the last 72 hours. Urine analysis:    Component Value Date/Time   COLORURINE YELLOW 12/28/2016 1915   APPEARANCEUR HAZY (A) 12/28/2016 1915   LABSPEC 1.018 12/28/2016 1915   PHURINE 5.0 12/28/2016 1915   GLUCOSEU NEGATIVE 12/28/2016 1915   HGBUR NEGATIVE 12/28/2016 1915   BILIRUBINUR NEGATIVE 12/28/2016  1915   KETONESUR NEGATIVE 12/28/2016 1915   PROTEINUR NEGATIVE 12/28/2016 1915   UROBILINOGEN 0.2 09/16/2014 1513   NITRITE NEGATIVE 12/28/2016 1915   LEUKOCYTESUR MODERATE (A) 12/28/2016 1915   Sepsis Labs: @LABRCNTIP (procalcitonin:4,lacticidven:4)  ) Recent Results (from the past 240 hour(s))  MRSA PCR Screening     Status: None   Collection Time: 10/24/17  2:45 AM  Result Value Ref Range Status   MRSA by PCR NEGATIVE NEGATIVE Final    Comment:        The GeneXpert MRSA Assay (FDA approved for NASAL specimens only), is one component of a comprehensive MRSA colonization surveillance program. It is not intended to diagnose MRSA infection nor to guide or monitor treatment for MRSA infections. Performed at [facility], [place] [address] [place], [place] [address]          Radiology Studies: Dg Chest 2 View  Result Date: 10/23/2017 CLINICAL DATA:  Chest pain EXAM: CHEST - 2 VIEW COMPARISON:  07/28/2016 FINDINGS: Left-sided pacing device as  before. Coils in the epigastric region and right upper quadrant. Hyperinflation with minimal atelectasis or scar at the bases. No acute consolidation or effusion. Stable cardiomediastinal silhouette with borderline cardiomegaly. Aortic atherosclerosis. No pneumothorax. Probable old right seventh rib fracture. IMPRESSION: 1. No radiographic evidence for acute cardiopulmonary abnormality. 2. Borderline cardiomegaly. Minimal scarring or atelectasis at the bases. Electronically Signed   By: Donavan Foil M.D.   On: 10/23/2017 18:18   Ct Angio Chest/abd/pel For Dissection W And/or Wo Contrast  Result Date: 10/23/2017 CLINICAL DATA:  Chest pain.  Acute aortic syndrome suspected. EXAM: CT ANGIOGRAPHY CHEST, ABDOMEN AND PELVIS TECHNIQUE: Multidetector CT imaging through the chest, abdomen and pelvis was performed using the standard protocol during bolus administration of intravenous contrast. Multiplanar reconstructed images and MIPs were  obtained and reviewed to evaluate the vascular anatomy. CONTRAST:  170mL ISOVUE-370 IOPAMIDOL (ISOVUE-370) INJECTION 76% COMPARISON:  Chest radiograph earlier this day. Dissection protocol chest CT 10/18/2009 FINDINGS: CTA CHEST FINDINGS Cardiovascular: Aneurysmal ascending aorta measuring 4.2 cm. Mild undersurface plaque of the transverse aorta without frank ulceration. There is irregular intraluminal low-density involving the distal arch distal to the takeoff of the great vessels with heterogeneous low-density that extends throughout the descending aorta, intermittently visualized to varying degrees. Ill-defined flap is not visualized. There is diffuse atherosclerotic calcifications. No filling defects in the pulmonary arteries to suggest pulmonary embolus. There is mild cardiomegaly with right greater than left heart dilatation. Contrast refluxes into the hepatic veins which are dilated. Pacemaker wires in place. No pericardial effusion. No evidence of aortic hematoma or penetrating aortic ulcer. Mediastinum/Nodes: No enlarged mediastinal or hilar lymph nodes. Esophagus is decompressed. No thyroid nodule. Lungs/Pleura: No consolidation, pleural effusion or pulmonary edema. Minimal central bronchial thickening. No pulmonary mass. Musculoskeletal: Prominent Schmorl's node superior endplate of T5. No acute osseous abnormalities. No focal osseous lesion. 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 Review of the MIP images confirms the above findings. CTA ABDOMEN AND PELVIS FINDINGS VASCULAR Aorta: Infrarenal aneurysm measures 4.7 x 4.0 cm with peripheral eccentric thrombus. No periaortic stranding. Irregular intraluminal low-density in the descending thoracic aorta conitues in the abdominal aorta and is faintly visualized in the proximal aneurysm and mid aneurysm body. No periaortic stranding or inflammation. Celiac: Small in caliber  with stenosis at the origin due to calcified and noncalcified atheromatous plaque. No evidence of dissection. SMA: Mild plaque at the origin without significant stenosis. No dissection. Incidental replaced right hepatic artery. Renals: Plaque at the origin of both renal arteries with mild stenosis. Slight beading of bilateral renal arteries which may be tortuosity or can be seen with fibromuscular dysplasia. IMA: Patent arising from the aneurysmal aorta. Inflow: Moderate tortuosity and atherosclerotic calcifications. No dissection visualized. No aneurysm. Veins: Contrast refluxes into the hepatic veins and IVC which are dilated. Review of the MIP images confirms the above findings. NON-VASCULAR Hepatobiliary: Pneumobilia in the left greater than right lobe likely postprocedural, patient with history of ERCP. Surgical clips from prior cholecystectomy, clips or coils also extends along the left lobe. No discrete focal lesion. Pancreas: No ductal dilatation or inflammation. Spleen: Normal in size.  Normal arterial phase enhancement. Adrenals/Urinary Tract: Unchanged low-density thickening of the left adrenal gland. Right adrenal gland is normal. There is homogeneous renal enhancement. RFA defect from the posterolateral left kidney is unchanged in appearance. No hydronephrosis. Urinary bladder is completely nondistended. Stomach/Bowel: Diffuse colonic diverticulosis without diverticulitis. No bowel dilatation, inflammation or evident  wall thickening. Appendix not confidently visualized. Lymphatic: No enlarged abdominal or pelvic lymph nodes. Reproductive: Uterus and bilateral adnexa are unremarkable. Other: No free air, free fluid, or intra-abdominal fluid collection. Musculoskeletal: L3 compression fracture with kyphoplasty. Remote bilateral inferior and superior pubic rami fractures, probable remote bilateral sacral fractures. The bones are under mineralized. Review of the MIP images confirms the above findings.  IMPRESSION: 1. Findings suspicious for atypical/early type B aortic dissection arising distal to the takeoff of the great vessels in the transverse aorta. A well-defined flap is not visualized, however there is heterogeneous intraluminal low-density with heterogeneous flow extending throughout the descending aorta and involving the abdominal aorta in the expected distribution of aortic dissection. Follow-up CTA could be considered. 2. Aneurysmal dilatation of the ascending aorta, maximal dimension 4.2 cm. Infrarenal aortic aneurysm measuring 4.7 x 4.0 cm without evidence of rupture. 3. Diffuse thoracoabdominal aortic atherosclerosis. 4. Mild cardiomegaly with primarily right heart dilatation and contrast refluxing into the hepatic veins and IVC consistent with elevated right heart pressures. 5. Pneumobilia in the left greater than right liver likely postprocedural, patient with history of ERCP. 6. 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 Critical Value/emergent results were called by telephone at the time of interpretation on 10/23/2017 at 10:14 pm to Dr. Pattricia Boss , who verbally acknowledged these results. Electronically Signed   By: Jeb Levering M.D.   On: 10/23/2017 22:14        Scheduled Meds: . calcium-vitamin D  1 tablet Oral BID  . dabigatran  75 mg Oral Q12H  . famotidine  20 mg Oral QHS  . furosemide  40 mg Oral Daily  . metoprolol tartrate  50 mg Oral BID  . multivitamin with minerals  1 tablet Oral Daily  . rosuvastatin  5 mg Oral QPM   Continuous Infusions: . sodium chloride    . labetalol (NORMODYNE) infusion Stopped (10/24/17 1530)     LOS: 2 days    Time spent: 40 minutes    WOODS, Geraldo Docker, MD [facility] Pager [phone]   If 7PM-7AM, please contact night-coverage [email or web address] Password [email or web address] 10/25/2017, 8:07 AM

## 2017-10-25 NOTE — Progress Notes (Signed)
  Progress Note    10/25/2017 8:37 AM * No surgery found *  Subjective:  No complaints   Vitals:   10/25/17 0700 10/25/17 0730  BP: 102/70 102/70  Pulse: 70 70  Resp: (!) 25 14  Temp:    SpO2: 98% 96%    Physical Exam: aaox3 Abdomen is soft Palpable radial and femoral pulses  CBC    Component Value Date/Time   WBC 8.4 10/25/2017 0511   RBC 4.33 10/25/2017 0511   HGB 13.4 10/25/2017 0511   HCT 41.5 10/25/2017 0511   PLT 124 (L) 10/25/2017 0511   MCV 95.8 10/25/2017 0511   MCH 30.9 10/25/2017 0511   MCHC 32.3 10/25/2017 0511   RDW 13.4 10/25/2017 0511   LYMPHSABS 2.1 03/30/2017 0142   MONOABS 0.2 03/30/2017 0142   EOSABS 0.0 03/30/2017 0142   BASOSABS 0.0 03/30/2017 0142    BMET    Component Value Date/Time   NA 140 10/25/2017 0511   NA 144 07/15/2017 1052   K 4.1 10/25/2017 0511   CL 108 10/25/2017 0511   CO2 24 10/25/2017 0511   GLUCOSE 91 10/25/2017 0511   BUN 16 10/25/2017 0511   BUN 20 07/15/2017 1052   CREATININE 0.96 10/25/2017 0511   CREATININE 0.98 (H) 08/28/2016 1343   CALCIUM 9.0 10/25/2017 0511   GFRNONAA 51 (L) 10/25/2017 0511   GFRAA 59 (L) 10/25/2017 0511    INR    Component Value Date/Time   INR 1.24 10/23/2017 1737     Intake/Output Summary (Last 24 hours) at 10/25/2017 0837 Last data filed at 10/25/2017 0600 Gross per 24 hour  Intake 600.63 ml  Output 1575 ml  Net -974.37 ml     Assessment:  84 y.o. female is here with concern for type b aortic dissection  Plan: Will need repeat CT angio chest abdomen pelvis at 72 hour interval (tomorrow) Hold pradaxa until scan repeated Ok for diet and oob from vascular standpoint   Halena Mohar C. Donzetta Matters, MD [facility] Office: [phone] Pager: [phone]  10/25/2017 8:37 AM

## 2017-10-26 LAB — LIPID PANEL
Cholesterol: 103 mg/dL (ref 0–200)
HDL: 43 mg/dL (ref >40)
LDL CALC: 48 mg/dL (ref 0–99)
Total CHOL/HDL Ratio: 2.4 RATIO
Triglycerides: 60 mg/dL (ref <150)
VLDL: 12 mg/dL (ref 0–40)

## 2017-10-26 MED ORDER — METOPROLOL TARTRATE 50 MG PO TABS
75.0000 mg | ORAL_TABLET | Freq: Two times a day (BID) | ORAL | Status: DC
  Administered 2017-10-26 – 2017-10-27 (×3): 75 mg via ORAL
  Filled 2017-10-26 (×3): qty 1

## 2017-10-26 MED ORDER — ACETAMINOPHEN 325 MG PO TABS
650.0000 mg | ORAL_TABLET | Freq: Four times a day (QID) | ORAL | Status: DC | PRN
  Administered 2017-10-26 (×2): 650 mg via ORAL
  Filled 2017-10-26 (×2): qty 2

## 2017-10-26 MED ORDER — METOPROLOL TARTRATE 50 MG PO TABS: 75.0000 mg | ORAL_TABLET | Freq: Two times a day (BID) | ORAL | Status: DC

## 2017-10-26 NOTE — Progress Notes (Signed)
  Progress Note    10/26/2017 9:34 AM * No surgery found *  Subjective: No overnight issues, she has had minimal chest pain  Vitals:   10/26/17 0600 10/26/17 0916  BP: (!) 153/80 123/82  Pulse:  72  Resp:    Temp:    SpO2:      Physical Exam: She is awake alert and oriented Nonlabored respirations Abdomen is soft She has palpable radial and femoral pulses consistent with yesterday's exam Her feet are warm bilaterally and sensorimotor intact  CBC    Component Value Date/Time   WBC 8.4 10/25/2017 0511   RBC 4.33 10/25/2017 0511   HGB 13.4 10/25/2017 0511   HCT 41.5 10/25/2017 0511   PLT 124 (L) 10/25/2017 0511   MCV 95.8 10/25/2017 0511   MCH 30.9 10/25/2017 0511   MCHC 32.3 10/25/2017 0511   RDW 13.4 10/25/2017 0511   LYMPHSABS 2.1 03/30/2017 0142   MONOABS 0.2 03/30/2017 0142   EOSABS 0.0 03/30/2017 0142   BASOSABS 0.0 03/30/2017 0142    BMET    Component Value Date/Time   NA 140 10/25/2017 0511   NA 144 07/15/2017 1052   K 4.1 10/25/2017 0511   CL 108 10/25/2017 0511   CO2 24 10/25/2017 0511   GLUCOSE 91 10/25/2017 0511   BUN 16 10/25/2017 0511   BUN 20 07/15/2017 1052   CREATININE 0.96 10/25/2017 0511   CREATININE 0.98 (H) 08/28/2016 1343   CALCIUM 9.0 10/25/2017 0511   GFRNONAA 51 (L) 10/25/2017 0511   GFRAA 59 (L) 10/25/2017 0511    INR    Component Value Date/Time   INR 1.24 10/23/2017 1737     Intake/Output Summary (Last 24 hours) at 10/26/2017 0934 Last data filed at 10/25/2017 1900 Gross per 24 hour  Intake -  Output 1250 ml  Net -1250 ml     Assessment:  84 y.o. female is here with type B aortic dissection  Plan: Repeat CT Angie of her chest abdomen and pelvis can be done either today or tomorrow.   Pradaxa has been restarted if any intervention were to be needed this would have to be held for 48 hours Blood pressure remains mildly elevated will need better control moving forward Okay for diet and activity as tolerated from  vascular standpoint.    Lajean Boese C. Donzetta Matters, MD [facility] Office: [phone] Pager: [phone]  10/26/2017 9:34 AM

## 2017-10-26 NOTE — Progress Notes (Signed)
PROGRESS NOTE    [name]  OJJ:[id number] DOB: 07/18/1925 DOA: 10/23/2017 PCP: Merrilee Seashore, MD   Brief Narrative:  84 year old WF PMHx Atrial fibrillation on pradaxa, Complete heart block s/p Pacemaker, COPD, HTN, Abdominal aortic aneurysm,  Cardiac tamponade, recurrent episode, admitted 9/5,Complete heart block (01/23/2016),pericarditits (01/17/2012),Heart murmur, COPD,Diverticulosis of colon,  Fibromuscular dysplasia  Developed chest pain and mild SOB. She reports these symptoms off and on for years, but now they were more severe and more constant. Chest pain was described as left sternal with radiation to the back. Her husband convinced her to present to the ED 6/13.      Subjective: 6/16 A/O x4, negative CP, naming negative S OB, negative abdominal pain.  States ambulated around the ward negative dizziness or presyncope.  Positive fatigue.   Assessment & Plan:   Active Problems:   Aortic aneurysm (HCC)   Type B Aortic Dissection. -Goal SBP 100 to 120, HR < 100 -At goal - CTA chest/pelvis/abdomen pending for Monday -See CHF   Chronic Diastolic CHF -DC labetalol per cardiology - 6/16 increase Metoprolol 75 mg increase BID(Toprol XL 100 mg daily home dose)  - Lasix 40 mg daily -Metoprolol IV PRN SBP > 140 -Strict in and out -2.3 L -Daily weight Filed Weights   10/24/17 0235 10/25/17 0500 10/26/17 0500  Weight: 138 lb 3.7 oz (62.7 kg) 138 lb 3.7 oz (62.7 kg) 139 lb 15.9 oz (63.5 kg)  - Transfuse for hemoglobin<8  Chronic Atrial Fibrillation -Pradaxa 75 mg BID -Currently NSR (paced) -See CHF  Essential HTN -See CHF  Complete heart block -S/P Pacer placement - see CHF  COPD -Xopenex PRN  HLD -Crestor 5 mg daily - Lipid panel pending     DVT prophylaxis: Pradaxa Code Status: Partial  Family Communication: None Disposition Plan: Clearance from vascular surgery.   Consultants:  Vascular surgery PCCM Cardiology     Procedures/Significant  Events:  Echo 06/12/17 >> EF 65 to 70%, mild AS, mod AR, mild MR, PAS 32 mmHg CT angio chest 6/13 >> type B aortic dissection, aneurysmal dilation of ascending aorta 4.2 cm, atherosclerosis  6/13 Admit, VVS consulted 6/14 consult cardiology  I have personally reviewed and interpreted all radiology studies and my findings are as above.  VENTILATOR SETTINGS:    Cultures   Antimicrobials:    Devices    LINES / TUBES:      Continuous Infusions: . sodium chloride       Objective: Vitals:   10/26/17 0300 10/26/17 0400 10/26/17 0500 10/26/17 0600  BP: 135/83 (!) 154/87 (!) 155/80 (!) 153/80  Pulse: 70 70 70   Resp: (!) 24 (!) 25 (!) 21   Temp:      TempSrc:      SpO2: 92% 90% 96%   Weight:   139 lb 15.9 oz (63.5 kg)   Height:        Intake/Output Summary (Last 24 hours) at 10/26/2017 0816 Last data filed at 10/25/2017 1900 Gross per 24 hour  Intake -  Output 1250 ml  Net -1250 ml   Filed Weights   10/24/17 0235 10/25/17 0500 10/26/17 0500  Weight: 138 lb 3.7 oz (62.7 kg) 138 lb 3.7 oz (62.7 kg) 139 lb 15.9 oz (63.5 kg)    Physical Exam:  General: A/O x4, No acute respiratory distress Neck:  Negative scars, masses, torticollis, lymphadenopathy, JVD Lungs: Clear to auscultation bilaterally without wheezes or crackles Cardiovascular: Regular rate and rhythm ( paced) without murmur gallop or  rub normal S1 and S2 Abdomen: negative abdominal pain, nondistended, positive soft, bowel sounds, no rebound, no ascites, no appreciable mass Extremities: No significant cyanosis, clubbing, or edema bilateral lower extremities Skin: Negative rashes, lesions, ulcers Psychiatric:  Negative depression, negative anxiety, negative fatigue, negative mania  Central nervous system:  Cranial nerves II through XII intact, tongue/uvula midline, all extremities muscle strength 5/5, sensation intact throughout, negative dysarthria, negative expressive aphasia, negative receptive  aphasia.      Data Reviewed: Care during the described time interval was provided by me .  I have reviewed this patient's available data, including medical history, events of note, physical examination, and all test results as part of my evaluation.   CBC: Recent Labs  Lab 10/23/17 1737 10/24/17 0625 10/25/17 0511  WBC 7.5 7.2 8.4  HGB 15.6* 13.4 13.4  HCT 49.2* 40.4 41.5  MCV 99.0 95.3 95.8  PLT 152 130* 683*   Basic Metabolic Panel: Recent Labs  Lab 10/23/17 1737 10/24/17 0625 10/25/17 0511  NA 143 141 140  K 5.1 3.5 4.1  CL 106 110 108  CO2 29 25 24   GLUCOSE 104* 100* 91  BUN 19 16 16   CREATININE 1.29* 0.94 0.96  CALCIUM 10.1 8.7* 9.0  MG  --  2.2  --   PHOS  --  4.0  --    GFR: Estimated Creatinine Clearance: 34.1 mL/min (by C-G formula based on SCr of 0.96 mg/dL). Liver Function Tests: No results for input(s): AST, ALT, ALKPHOS, BILITOT, PROT, ALBUMIN in the last 168 hours. No results for input(s): LIPASE, AMYLASE in the last 168 hours. No results for input(s): AMMONIA in the last 168 hours. Coagulation Profile: Recent Labs  Lab 10/23/17 1737  INR 1.24   Cardiac Enzymes: No results for input(s): CKTOTAL, CKMB, CKMBINDEX, TROPONINI in the last 168 hours. BNP (last 3 results) No results for input(s): PROBNP in the last 8760 hours. HbA1C: No results for input(s): HGBA1C in the last 72 hours. CBG: No results for input(s): GLUCAP in the last 168 hours. Lipid Profile: Recent Labs    10/26/17 0539  CHOL 103  HDL 43  LDLCALC 48  TRIG 60  CHOLHDL 2.4   Thyroid Function Tests: No results for input(s): TSH, T4TOTAL, FREET4, T3FREE, THYROIDAB in the last 72 hours. Anemia Panel: No results for input(s): VITAMINB12, FOLATE, FERRITIN, TIBC, IRON, RETICCTPCT in the last 72 hours. Urine analysis:    Component Value Date/Time   COLORURINE YELLOW 12/28/2016 1915   APPEARANCEUR HAZY (A) 12/28/2016 1915   LABSPEC 1.018 12/28/2016 1915   PHURINE 5.0  12/28/2016 1915   GLUCOSEU NEGATIVE 12/28/2016 1915   HGBUR NEGATIVE 12/28/2016 1915   BILIRUBINUR NEGATIVE 12/28/2016 1915   KETONESUR NEGATIVE 12/28/2016 1915   PROTEINUR NEGATIVE 12/28/2016 1915   UROBILINOGEN 0.2 09/16/2014 1513   NITRITE NEGATIVE 12/28/2016 1915   LEUKOCYTESUR MODERATE (A) 12/28/2016 1915   Sepsis Labs: @LABRCNTIP (procalcitonin:4,lacticidven:4)  ) Recent Results (from the past 240 hour(s))  MRSA PCR Screening     Status: None   Collection Time: 10/24/17  2:45 AM  Result Value Ref Range Status   MRSA by PCR NEGATIVE NEGATIVE Final    Comment:        The GeneXpert MRSA Assay (FDA approved for NASAL specimens only), is one component of a comprehensive MRSA colonization surveillance program. It is not intended to diagnose MRSA infection nor to guide or monitor treatment for MRSA infections. Performed at [facility], [place] [address] [place], [place] [address]  Radiology Studies: No results found.      Scheduled Meds: . calcium-vitamin D  1 tablet Oral BID  . dabigatran  75 mg Oral Q12H  . famotidine  20 mg Oral QHS  . furosemide  40 mg Oral Daily  . metoprolol tartrate  62.5 mg Oral BID  . multivitamin with minerals  1 tablet Oral Daily  . rosuvastatin  5 mg Oral QPM   Continuous Infusions: . sodium chloride       LOS: 3 days    Time spent: 40 minutes    WOODS, Geraldo Docker, MD [facility] Pager [phone]   If 7PM-7AM, please contact night-coverage [email or web address] Password [email or web address] 10/26/2017, 8:16 AM

## 2017-10-27 ENCOUNTER — Encounter (HOSPITAL_COMMUNITY): Payer: Self-pay | Admitting: Radiology

## 2017-10-27 ENCOUNTER — Inpatient Hospital Stay (HOSPITAL_COMMUNITY): Payer: Medicare Other

## 2017-10-27 DIAGNOSIS — I350 Nonrheumatic aortic (valve) stenosis: Secondary | ICD-10-CM

## 2017-10-27 DIAGNOSIS — D696 Thrombocytopenia, unspecified: Secondary | ICD-10-CM

## 2017-10-27 LAB — BASIC METABOLIC PANEL
ANION GAP: 10 (ref 5–15)
BUN: 14 mg/dL (ref 6–20)
CO2: 26 mmol/L (ref 22–32)
Calcium: 9.1 mg/dL (ref 8.9–10.3)
Chloride: 103 mmol/L (ref 101–111)
Creatinine, Ser: 0.92 mg/dL (ref 0.44–1.00)
GFR calc Af Amer: >60 mL/min (ref >60)
GFR calc non Af Amer: 53 mL/min — ABNORMAL LOW (ref >60)
GLUCOSE: 88 mg/dL (ref 65–99)
POTASSIUM: 3.9 mmol/L (ref 3.5–5.1)
Sodium: 139 mmol/L (ref 135–145)

## 2017-10-27 LAB — MAGNESIUM: Magnesium: 2.3 mg/dL (ref 1.7–2.4)

## 2017-10-27 MED ORDER — IOPAMIDOL (ISOVUE-370) INJECTION 76%
100.0000 mL | Freq: Once | INTRAVENOUS | Status: AC
  Administered 2017-10-27: 100 mL via INTRAVENOUS

## 2017-10-27 MED ORDER — ASPIRIN 81 MG PO TABS: 81.0000 mg | ORAL_TABLET | Freq: Every day | ORAL | Status: DC

## 2017-10-27 MED ORDER — IOPAMIDOL (ISOVUE-370) INJECTION 76%
INTRAVENOUS | Status: AC
  Filled 2017-10-27: qty 100

## 2017-10-27 NOTE — Progress Notes (Signed)
   I reviewed his CAT scan discussed with patient.  Should be okay to discharge and follow-up in 4 to 6 weeks with repeat CAT scan as outpatient.  I have asked her to start taking aspirin.  If she has bleeding issues she can stop this given that she also takes Pradaxa.  Aryaa Bunting C. Donzetta Matters, MD [facility] Office: [phone] Pager: [phone]

## 2017-10-27 NOTE — Consult Note (Signed)
            [facility] CM Primary Care Navigator  10/27/2017  Nilsa Macht 1926-01-03 [id number]   Went to seepatient at the bedsideto identify possible discharge needs but she was alreadydischargedper staff report.  Patient was discharged home today.  Per chart review, patient was admitted for Type B aortic dissection, Aneurysm of the ascending aorta and thrombocytopenia.  Primary care provider's officeis listed as providing transition of care (TOC) follow-up.  Patient has discharge instruction to follow-up with vascular surgery in 6 weeks.   For additional questions please contact:  Edwena Felty A. Sarajane Fambrough, BSN, RN-BC [facility] PRIMARY CARE Navigator Cell: [phone]

## 2017-10-27 NOTE — Progress Notes (Signed)
Progress Note  Patient Name: [name] Date of Encounter: 10/27/2017  Primary Cardiologist: Sanda Klein, MD   Subjective   Walked to the nurses station without chest pain.  No chest pain at all overnight.  Very eager to go home. Scheduled for repeat CT angio of chest today.  Inpatient Medications    Scheduled Meds: . calcium-vitamin D  1 tablet Oral BID  . dabigatran  75 mg Oral Q12H  . famotidine  20 mg Oral QHS  . furosemide  40 mg Oral Daily  . metoprolol tartrate  75 mg Oral BID  . multivitamin with minerals  1 tablet Oral Daily  . rosuvastatin  5 mg Oral QPM   Continuous Infusions: . sodium chloride     PRN Meds: sodium chloride, acetaminophen, fluticasone, levalbuterol, metoprolol tartrate   Vital Signs    Vitals:   10/26/17 1629 10/26/17 1935 10/27/17 0020 10/27/17 0413  BP: 127/70 (!) 150/70 120/70 (!) 143/72  Pulse: 73 79 70 75  Resp: 16   18  Temp: 98.3 F (36.8 C) 98.1 F (36.7 C) 97.6 F (36.4 C) 97.7 F (36.5 C)  TempSrc: Oral Oral Oral Oral  SpO2: 92% 97% 94% 97%  Weight: 136 lb 4.8 oz (61.8 kg)   134 lb 1.6 oz (60.8 kg)  Height: 5\' 2"  (1.575 m)       Intake/Output Summary (Last 24 hours) at 10/27/2017 0929 Last data filed at 10/26/2017 1400 Gross per 24 hour  Intake -  Output 1050 ml  Net -1050 ml   Filed Weights   10/26/17 0500 10/26/17 1629 10/27/17 0413  Weight: 139 lb 15.9 oz (63.5 kg) 136 lb 4.8 oz (61.8 kg) 134 lb 1.6 oz (60.8 kg)    Telemetry    Ventricular paced rhythm- Personally Reviewed  ECG    Atrial fibrillation, ventricular paced rhythm- Personally Reviewed  Physical Exam  Smiling, in a very good mood GEN: No acute distress.   Neck: No JVD Cardiac: RRR, paradoxically split S2, no diastolic murmurs, rubs, or gallops.  Respiratory: Clear to auscultation bilaterally. GI: Soft, nontender, non-distended  MS: No edema; No deformity. Neuro:  Nonfocal  Psych: Normal affect   Labs    Chemistry Recent Labs    Lab 10/24/17 0625 10/25/17 0511 10/27/17 0419  NA 141 140 139  K 3.5 4.1 3.9  CL 110 108 103  CO2 25 24 26   GLUCOSE 100* 91 88  BUN 16 16 14   CREATININE 0.94 0.96 0.92  CALCIUM 8.7* 9.0 9.1  GFRNONAA 52* 51* 53*  GFRAA >60 59* >60  ANIONGAP 6 8 10      Hematology Recent Labs  Lab 10/23/17 1737 10/24/17 0625 10/25/17 0511  WBC 7.5 7.2 8.4  RBC 4.97 4.24 4.33  HGB 15.6* 13.4 13.4  HCT 49.2* 40.4 41.5  MCV 99.0 95.3 95.8  MCH 31.4 31.6 30.9  MCHC 31.7 33.2 32.3  RDW 13.4 13.3 13.4  PLT 152 130* 124*    Cardiac EnzymesNo results for input(s): TROPONINI in the last 168 hours.  Recent Labs  Lab 10/23/17 1752  TROPIPOC 0.01     BNPNo results for input(s): BNP, PROBNP in the last 168 hours.   DDimer No results for input(s): DDIMER in the last 168 hours.   Radiology    No results found.  Cardiac Studies  January 31st 2019 - Left ventricle: The cavity size was normal. There was moderate   concentric hypertrophy. Systolic function was vigorous. The   estimated ejection fraction  was in the range of 65% to 70%. Wall   motion was normal; there were no regional wall motion   abnormalities. The study is not technically sufficient to allow   evaluation of LV diastolic function. - Aortic valve: Trileaflet; severely thickened, severely calcified   leaflets. Valve mobility was restricted. There was very mild   stenosis. There was moderate regurgitation. Mean gradient (S): 8   mm Hg. Valve area (VTI): 1.61 cm^2. Valve area (Vmax): 1.12 cm^2.   Valve area (Vmean): 1.4 cm^2. - Mitral valve: Calcified annulus. Mildly thickened leaflets .   There was mild regurgitation. - Right ventricle: Pacer wire or catheter noted in right ventricle.   Systolic function was normal. - Right atrium: Pacer wire or catheter noted in right atrium. - Tricuspid valve: There was moderate regurgitation. - Pulmonary arteries: Systolic pressure was mildly increased. PA   peak pressure: 32 mm Hg  (S). - Inferior vena cava: The vessel was normal in size. - Pericardium, extracardiac: There was no pericardial effusion.  Patient Profile     84 y.o. female admitted with uncomplicated type B aortic dissection on a background of atrial fibrillation, complete heart block  S/p pacemaker [facility] 2013), chronic diastolic heart failure, moderate nonobstructive CAD, COPD, AAA, hypertension and hyperlipidemia  Assessment & Plan    1. Ao dissection type B: Conservative management is strongly favored in this 84 year old.  She is scheduled for repeat CT angiogram of the chest today.  If no progression of the abnormality, suspect we can discharge her today.  Has a small size aneurysm of the ascending aorta 4.2 cm and a moderate infrarenal aortic aneurysm 4.7 cm. 2. CHF: Appears to be clinically euvolemic. 3. HTN: Fair control.  With a single exception her blood pressure has been 120/70 over the last 24 hours 4. CHB: pacemaker dependent 5. AFib: Permanent.  Restarted anticoagulation, and to continue if there is no plan for intervention. 6. CAD:, Nonobstructive by catheterization in December 2016 7. AS: Mild, not hemodynamically important.   For questions or updates, please contact [place] Please consult [email or web address] for contact info under Cardiology/STEMI.      Signed, Sanda Klein, MD  10/27/2017, 9:29 AM

## 2017-10-27 NOTE — Discharge Instructions (Signed)

## 2017-10-27 NOTE — Discharge Summary (Signed)
Physician Discharge Summary  [name] IZT:[id number] DOB: [birth year] DOA: 10/23/2017  PCP: Merrilee Seashore, MD  Admit date: 10/23/2017 Discharge date: 10/27/2017  Recommendations for Outpatient Follow-up:   Type B aortic dissection.   --medical management. F/u CT 6 weeks. F/u with [facility] as outpatient --SBP goal 100-120. HR <100.  Aneurysm of the ascending aorta 4.2 cm and infrarenal aortic aneurysm 4.7 cm --Annual imaging recommended by CTA or MRA.  Modest thrombocytopenia --Follow-up as an outpatient  Follow-up Information    [name], MD. Schedule an appointment as soon as possible for a visit in 6 week(s).   Specialties:  Vascular Surgery, Cardiology Contact information: [address] [place] [address] [phone]            Discharge Diagnoses:  1. Type B aortic dissection 2. Aneurysm of the ascending aorta 4.2 cm  3. Infrarenal aortic aneurysm 4.7 cm 4. Modest thrombocytopenia 5. Chronic atrial fibrillation 6. Chronic diastolic CHF 7. COPD 8. Essential hypertension  Hyperlipidemia  Discharge Condition: improved Disposition: hom  Diet recommendation: heart healthy  Filed Weights   10/26/17 0500 10/26/17 1629 10/27/17 0413  Weight: 63.5 kg (139 lb 15.9 oz) 61.8 kg (136 lb 4.8 oz) 60.8 kg (134 lb 1.6 oz)    History of present illness:  84 year old woman PMH atrial fibrillation, complete heart block status post pacemaker, COPD, essential hypertension, presented with chest pain shortness of breath.  Found to have early type B aortic dissection.  Vascular surgery consulted and recommended medical management.  Started on labetalol infusion and admitted to the ICU.  Hospital Course:  Patient was treated with aggressive blood pressure control, followed by vascular surgery and cardiology.  She remained asymptomatic and vascular surgery recommended medical management and repeat scanning which was performed 6/17.  Patient remains  completely asymptomatic and desires discharge home.  Case discussed with Dr. Donzetta Matters of vascular surgery who has reviewed the scan, will discuss with patient, likely discharge later today.  Plan follow-up in 6 weeks with repeat scan.  Type B aortic dissection.  Also known aneurysm of the ascending aorta 4.2 cm and infrarenal aortic aneurysm 4.7 cm --Repeat imaging shows stability of ascending thoracic aortic aneurysm.  Annual imaging recommended by CTA or MRA.  Small focal dissection anteriorly and proximal descending thoracic aorta consistent with type B aortic dissection.  Infrarenal abdominal aortic aneurysm stable. Recommended follow-up imaging in 6 months. --SBP goal 100-120. HR <100. --Heart rate 70s.  SBP 140s today but overall is been well controlled.  Modest thrombocytopenia --Follow-up as an outpatient  Chronic atrial fibrillation, status post ablation, status post permanent pacemaker --Continue metoprolol, Pradaxa  Chronic diastolic CHF --Appears euvolemic.  COPD -- Stable.  Essential hypertension -- Stable.  Hyperlipidemia --Continue Crestor  Consultants:  Vascular surgery  Procedures:  None  Today's assessment: S: Feels well, no complaints.  No chest pain.  Wants to go home. O: Vitals:  Vitals:   10/27/17 0413 10/27/17 1226  BP: (!) 143/72 (!) 141/95  Pulse: 75 73  Resp: 18 20  Temp: 97.7 F (36.5 C) 97.8 F (36.6 C)  SpO2: 97% 98%    Constitutional:  . Appears calm and comfortable Respiratory:  . CTA bilaterally, no w/r/r.  . Respiratory effort normal Cardiovascular:  . RRR, 2/6 systolic murmur, no r/g . No LE extremity edema   Psychiatric:  . judgement and insight appear intact . Mental status o Mood, affect appropriate  Basic metabolic panel unremarkable.  Magnesium within normal limits. CT angiogram chest, abdomen,  pelvis noted.  Discharge Instructions   Allergies as of 10/27/2017      Reactions   Tramadol Other (See Comments)     Medication made her feel "off and talk to herself"   Protonix [pantoprazole Sodium] Other (See Comments)   Abdominal pain      Medication List    TAKE these medications   aspirin 81 MG tablet Take 1 tablet (81 mg total) by mouth daily.   CALCIUM + D PO Take 1 tablet by mouth 2 (two) times daily.   dabigatran 75 MG Caps capsule Commonly known as:  PRADAXA TAKE ONE CAPSULE BY MOUTH EVERY 12 HOURS   Fish Oil 1000 MG Caps Take 1,000 mg by mouth daily.   fluticasone 50 MCG/ACT nasal spray Commonly known as:  FLONASE Place 1 spray into both nostrils daily as needed for allergies or rhinitis.   furosemide 40 MG tablet Commonly known as:  LASIX Take 1 tablet (40 mg total) by mouth daily.   loratadine 10 MG tablet Commonly known as:  CLARITIN Take 10 mg by mouth daily as needed for allergies.   magnesium oxide 400 (241.3 Mg) MG tablet Commonly known as:  MAG-OX Take 1 tablet (400 mg total) by mouth daily.   metoprolol succinate 100 MG 24 hr tablet Commonly known as:  TOPROL-XL TAKE 1 TABLET BY MOUTH DAILY WITH OR IMMEDIATELY FOLLOWING A MEAL   PRESERVISION AREDS 2 Caps Take 1 capsule by mouth 2 (two) times daily.   multivitamins ther. w/minerals Tabs tablet Take 1 tablet by mouth every morning.   potassium chloride 10 MEQ tablet Commonly known as:  K-DUR,KLOR-CON Take 10 mEq by mouth daily.   ranitidine 150 MG tablet Commonly known as:  ZANTAC Take 150 mg by mouth daily.   rosuvastatin 5 MG tablet Commonly known as:  CRESTOR Take 1 tablet (5 mg total) by mouth every evening.   SYSTANE OP Place 2 drops into both eyes 2 (two) times daily as needed (for dry eyes).      Allergies  Allergen Reactions  . Tramadol Other (See Comments)    Medication made her feel "off and talk to herself"  . Protonix [Pantoprazole Sodium] Other (See Comments)    Abdominal pain    The results of significant diagnostics from this hospitalization (including imaging,  microbiology, ancillary and laboratory) are listed below for reference.    Significant Diagnostic Studies: Dg Chest 2 View  Result Date: 10/23/2017 CLINICAL DATA:  Chest pain EXAM: CHEST - 2 VIEW COMPARISON:  07/28/2016 FINDINGS: Left-sided pacing device as before. Coils in the epigastric region and right upper quadrant. Hyperinflation with minimal atelectasis or scar at the bases. No acute consolidation or effusion. Stable cardiomediastinal silhouette with borderline cardiomegaly. Aortic atherosclerosis. No pneumothorax. Probable old right seventh rib fracture. IMPRESSION: 1. No radiographic evidence for acute cardiopulmonary abnormality. 2. Borderline cardiomegaly. Minimal scarring or atelectasis at the bases. Electronically Signed   By: Donavan Foil M.D.   On: 10/23/2017 18:18   Ct Angio Chest/abd/pel For Dissection W And/or W/wo  Result Date: 10/27/2017 CLINICAL DATA:  Type B aortic dissection. Abdominal aortic aneurysm. EXAM: CT ANGIOGRAPHY CHEST, ABDOMEN AND PELVIS TECHNIQUE: Multidetector CT imaging through the chest, abdomen and pelvis was performed using the standard protocol during bolus administration of intravenous contrast. Multiplanar reconstructed images and MIPs were obtained and reviewed to evaluate the vascular anatomy. CONTRAST:  123mL ISOVUE-370 IOPAMIDOL (ISOVUE-370) INJECTION 76% COMPARISON:  CT scan of October 23, 2017. FINDINGS: CTA CHEST FINDINGS Cardiovascular: Ascending  thoracic aortic aneurysm is noted measuring 4.1 cm. Mild cardiomegaly is noted. No pericardial effusion is noted. Left-sided pacemaker is unchanged in position. Atherosclerosis of thoracic aorta is noted. There is noted a severe stenosis at the origin of the right subclavian artery from the right innominate artery with poststenotic dilatation. Remaining great vessels are unremarkable. Small focal dissection is seen along the anterior portion of the proximal descending thoracic aorta. Mediastinum/Nodes: Thyroid gland  and esophagus are unremarkable. No significant mediastinal adenopathy is noted. Lungs/Pleura: Lungs are clear. No pleural effusion or pneumothorax. Musculoskeletal: No significant osseous abnormality is noted. Stable perispinal soft tissue masses are noted compared to prior exam consistent with nerve sheath tumors. Review of the MIP images confirms the above findings. CTA ABDOMEN AND PELVIS FINDINGS VASCULAR Aorta: Atherosclerosis of abdominal aorta is noted. Stable 4.7 cm infrarenal abdominal aortic aneurysm is noted. No definite dissection is noted in thoracic aorta. Celiac: Patent without evidence of aneurysm, dissection, vasculitis or significant stenosis. SMA: Patent without evidence of aneurysm, dissection, vasculitis or significant stenosis. Renals: Alternating dilatation and strictures are noted in the distal right renal artery suggesting fibromuscular dysplasia. Left renal artery is widely patent. IMA: Patent without evidence of aneurysm, dissection, vasculitis or significant stenosis. Inflow: Patent without evidence of aneurysm, dissection, vasculitis or significant stenosis. Veins: No obvious venous abnormality within the limitations of this arterial phase study. Review of the MIP images confirms the above findings. NON-VASCULAR Hepatobiliary: Stable left hepatic pneumobilia is noted. Status post cholecystectomy. Probable embolization coils are noted in the left hepatic lobe. Pancreas: Unremarkable. No pancreatic ductal dilatation or surrounding inflammatory changes. Spleen: Normal in size without focal abnormality. Adrenals/Urinary Tract: Adrenal glands appear normal. No hydronephrosis or renal obstruction is noted. No renal or ureteral calculi are noted. Urinary bladder is decompressed. Stable ablation site is seen involving lower pole of left kidney. Stomach/Bowel: There is no evidence of bowel obstruction or ileus. Appendix appears normal. Stomach appears normal. Diverticulosis of sigmoid colon is  noted without inflammation. Lymphatic: No significant adenopathy is noted. Reproductive: Uterus and bilateral adnexa are unremarkable. Other: No abdominal wall hernia or abnormality. No abdominopelvic ascites. Musculoskeletal: Status post kyphoplasty of L3. No acute osseous abnormality is noted. Review of the MIP images confirms the above findings. IMPRESSION: 4.1 cm ascending thoracic aortic aneurysm which is not significantly changed. Recommend annual imaging followup by CTA or MRA. This recommendation follows 2010 ACCF/AHA/AATS/ACR/ASA/SCA/SCAI/SIR/STS/SVM Guidelines for the Diagnosis and Management of Patients with Thoracic Aortic Disease. Circulation. 2010; 121: R485-I627. Small focal dissection is noted anteriorly in proximal descending thoracic aorta. This is consistent with type B aortic dissection. Severe focal stenosis is noted at origin of right subclavian artery from right innominate artery. Poststenotic dilatation is noted. Stable paraspinal soft tissue masses are noted most consistent with nerve sheath tumors. 4.7 cm infrarenal abdominal aortic aneurysm which is stable compared to prior exam. Recommend followup by abdomen and pelvis CTA in 6 months, and vascular surgery referral/consultation if not already obtained. This recommendation follows ACR consensus guidelines: White Paper of the ACR Incidental Findings Committee II on Vascular Findings. J Am Coll Radiol 2013; 10:789-794. Abnormal appearance of right renal artery is noted suggesting fibromuscular dysplasia or possibly multifocal mild stenoses. Remaining renal mesenteric arteries are widely patent. Sigmoid diverticulosis without inflammation. Stable previous ablation site involving lower pole of left kidney. Aortic Atherosclerosis (ICD10-I70.0). Electronically Signed   By: Marijo [place]ception, M.D.   On: 10/27/2017 14:05   Ct Angio Chest/abd/pel For Dissection W And/or Wo Contrast  Result Date: 10/23/2017 CLINICAL DATA:  Chest pain.  Acute  aortic syndrome suspected. EXAM: CT ANGIOGRAPHY CHEST, ABDOMEN AND PELVIS TECHNIQUE: Multidetector CT imaging through the chest, abdomen and pelvis was performed using the standard protocol during bolus administration of intravenous contrast. Multiplanar reconstructed images and MIPs were obtained and reviewed to evaluate the vascular anatomy. CONTRAST:  135mL ISOVUE-370 IOPAMIDOL (ISOVUE-370) INJECTION 76% COMPARISON:  Chest radiograph earlier this day. Dissection protocol chest CT 10/18/2009 FINDINGS: CTA CHEST FINDINGS Cardiovascular: Aneurysmal ascending aorta measuring 4.2 cm. Mild undersurface plaque of the transverse aorta without frank ulceration. There is irregular intraluminal low-density involving the distal arch distal to the takeoff of the great vessels with heterogeneous low-density that extends throughout the descending aorta, intermittently visualized to varying degrees. Ill-defined flap is not visualized. There is diffuse atherosclerotic calcifications. No filling defects in the pulmonary arteries to suggest pulmonary embolus. There is mild cardiomegaly with right greater than left heart dilatation. Contrast refluxes into the hepatic veins which are dilated. Pacemaker wires in place. No pericardial effusion. No evidence of aortic hematoma or penetrating aortic ulcer. Mediastinum/Nodes: No enlarged mediastinal or hilar lymph nodes. Esophagus is decompressed. No thyroid nodule. Lungs/Pleura: No consolidation, pleural effusion or pulmonary edema. Minimal central bronchial thickening. No pulmonary mass. Musculoskeletal: Prominent Schmorl's node superior endplate of T5. No acute osseous abnormalities. No focal osseous lesion. 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 Review of the MIP images confirms the above findings. CTA ABDOMEN AND PELVIS FINDINGS VASCULAR Aorta: Infrarenal aneurysm measures 4.7 x 4.0 cm with  peripheral eccentric thrombus. No periaortic stranding. Irregular intraluminal low-density in the descending thoracic aorta conitues in the abdominal aorta and is faintly visualized in the proximal aneurysm and mid aneurysm body. No periaortic stranding or inflammation. Celiac: Small in caliber with stenosis at the origin due to calcified and noncalcified atheromatous plaque. No evidence of dissection. SMA: Mild plaque at the origin without significant stenosis. No dissection. Incidental replaced right hepatic artery. Renals: Plaque at the origin of both renal arteries with mild stenosis. Slight beading of bilateral renal arteries which may be tortuosity or can be seen with fibromuscular dysplasia. IMA: Patent arising from the aneurysmal aorta. Inflow: Moderate tortuosity and atherosclerotic calcifications. No dissection visualized. No aneurysm. Veins: Contrast refluxes into the hepatic veins and IVC which are dilated. Review of the MIP images confirms the above findings. NON-VASCULAR Hepatobiliary: Pneumobilia in the left greater than right lobe likely postprocedural, patient with history of ERCP. Surgical clips from prior cholecystectomy, clips or coils also extends along the left lobe. No discrete focal lesion. Pancreas: No ductal dilatation or inflammation. Spleen: Normal in size.  Normal arterial phase enhancement. Adrenals/Urinary Tract: Unchanged low-density thickening of the left adrenal gland. Right adrenal gland is normal. There is homogeneous renal enhancement. RFA defect from the posterolateral left kidney is unchanged in appearance. No hydronephrosis. Urinary bladder is completely nondistended. Stomach/Bowel: Diffuse colonic diverticulosis without diverticulitis. No bowel dilatation, inflammation or evident wall thickening. Appendix not confidently visualized. Lymphatic: No enlarged abdominal or pelvic lymph nodes. Reproductive: Uterus and bilateral adnexa are unremarkable. Other: No free air, free  fluid, or intra-abdominal fluid collection. Musculoskeletal: L3 compression fracture with kyphoplasty. Remote bilateral inferior and superior pubic rami fractures, probable remote bilateral sacral fractures. The bones are under mineralized. Review of the MIP images confirms the above findings. IMPRESSION: 1. Findings suspicious for atypical/early type B aortic dissection arising distal to the takeoff of the great vessels in  the transverse aorta. A well-defined flap is not visualized, however there is heterogeneous intraluminal low-density with heterogeneous flow extending throughout the descending aorta and involving the abdominal aorta in the expected distribution of aortic dissection. Follow-up CTA could be considered. 2. Aneurysmal dilatation of the ascending aorta, maximal dimension 4.2 cm. Infrarenal aortic aneurysm measuring 4.7 x 4.0 cm without evidence of rupture. 3. Diffuse thoracoabdominal aortic atherosclerosis. 4. Mild cardiomegaly with primarily right heart dilatation and contrast refluxing into the hepatic veins and IVC consistent with elevated right heart pressures. 5. Pneumobilia in the left greater than right liver likely postprocedural, patient with history of ERCP. 6. 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 Critical Value/emergent results were called by telephone at the time of interpretation on 10/23/2017 at 10:14 pm to Dr. Pattricia Boss , who verbally acknowledged these results. Electronically Signed   By: Jeb Levering M.D.   On: 10/23/2017 22:14    Microbiology: Recent Results (from the past 240 hour(s))  MRSA PCR Screening     Status: None   Collection Time: 10/24/17  2:45 AM  Result Value Ref Range Status   MRSA by PCR NEGATIVE NEGATIVE Final    Comment:        The GeneXpert MRSA Assay (FDA approved for NASAL specimens only), is one component of a comprehensive MRSA  colonization surveillance program. It is not intended to diagnose MRSA infection nor to guide or monitor treatment for MRSA infections. Performed at [facility], [place] [address] [place], [place] [address]      Labs: Basic Metabolic Panel: Recent Labs  Lab 10/23/17 1737 10/24/17 0625 10/25/17 0511 10/27/17 0419  NA 143 141 140 139  K 5.1 3.5 4.1 3.9  CL 106 110 108 103  CO2 29 25 24 26   GLUCOSE 104* 100* 91 88  BUN 19 16 16 14   CREATININE 1.29* 0.94 0.96 0.92  CALCIUM 10.1 8.7* 9.0 9.1  MG  --  2.2  --  2.3  PHOS  --  4.0  --   --    CBC: Recent Labs  Lab 10/23/17 1737 10/24/17 0625 10/25/17 0511  WBC 7.5 7.2 8.4  HGB 15.6* 13.4 13.4  HCT 49.2* 40.4 41.5  MCV 99.0 95.3 95.8  PLT 152 130* 124*    Recent Labs    06/25/17 1457  BNP 391.2*    Active Problems:   Aortic aneurysm [facility])   Time coordinating discharge: 35 minutes  Signed:  Murray Hodgkins, MD [facility] 10/27/2017, 5:01 PM

## 2017-10-27 NOTE — Progress Notes (Signed)
Pt orders to dc home, iv removed with tip intact and pressure held, when nurse returned pt did have a small soft bruise at site,held pressure afor about 5 min then  watched for several minutes as reviewed AVS with family and pt, they verbalized understanding that they need to make f/u appt with Dr Donzetta Matters due to time of day, all questions entertained and answered, pt dc via wheelchair in stable conditon, no change in bruise at iv site

## 2017-10-27 NOTE — Progress Notes (Signed)
Pt has been npo since am shift started, pt to CT in stable condition via wheelchair, pt denies sob or chest pain

## 2017-10-28 DIAGNOSIS — N183 Chronic kidney disease, stage 3 (moderate): Secondary | ICD-10-CM | POA: Diagnosis not present

## 2017-10-28 DIAGNOSIS — I129 Hypertensive chronic kidney disease with stage 1 through stage 4 chronic kidney disease, or unspecified chronic kidney disease: Secondary | ICD-10-CM | POA: Diagnosis not present

## 2017-10-28 DIAGNOSIS — I712 Thoracic aortic aneurysm, without rupture: Secondary | ICD-10-CM | POA: Diagnosis not present

## 2017-10-28 DIAGNOSIS — Z09 Encounter for follow-up examination after completed treatment for conditions other than malignant neoplasm: Secondary | ICD-10-CM | POA: Diagnosis not present

## 2017-10-28 DIAGNOSIS — I10 Essential (primary) hypertension: Secondary | ICD-10-CM | POA: Diagnosis not present

## 2017-10-28 DIAGNOSIS — E782 Mixed hyperlipidemia: Secondary | ICD-10-CM | POA: Diagnosis not present

## 2017-10-28 DIAGNOSIS — I480 Paroxysmal atrial fibrillation: Secondary | ICD-10-CM | POA: Diagnosis not present

## 2017-10-28 DIAGNOSIS — I714 Abdominal aortic aneurysm, without rupture: Secondary | ICD-10-CM | POA: Diagnosis not present

## 2017-10-28 DIAGNOSIS — I7102 Dissection of abdominal aorta: Secondary | ICD-10-CM | POA: Diagnosis not present

## 2017-10-31 ENCOUNTER — Other Ambulatory Visit: Payer: Self-pay

## 2017-10-31 DIAGNOSIS — I713 Abdominal aortic aneurysm, ruptured, unspecified: Secondary | ICD-10-CM

## 2017-11-04 ENCOUNTER — Telehealth: Payer: Self-pay | Admitting: Vascular Surgery

## 2017-11-04 NOTE — Telephone Encounter (Signed)
sch appt spk to pt 12/26/17 1115am p/o MD

## 2017-11-18 ENCOUNTER — Ambulatory Visit
Admission: RE | Admit: 2017-11-18 | Discharge: 2017-11-18 | Disposition: A | Discharge status: Home / Self Care | Payer: Medicare Other | Source: Ambulatory Visit | Attending: Vascular Surgery | Admitting: Vascular Surgery

## 2017-11-18 ENCOUNTER — Other Ambulatory Visit: Payer: Medicare Other

## 2017-11-18 DIAGNOSIS — I713 Abdominal aortic aneurysm, ruptured, unspecified: Secondary | ICD-10-CM

## 2017-11-18 DIAGNOSIS — K5730 Diverticulosis of large intestine without perforation or abscess without bleeding: Secondary | ICD-10-CM | POA: Diagnosis not present

## 2017-11-18 DIAGNOSIS — I712 Thoracic aortic aneurysm, without rupture: Secondary | ICD-10-CM | POA: Diagnosis not present

## 2017-11-18 MED ORDER — IOPAMIDOL (ISOVUE-370) INJECTION 76%
75.0000 mL | Freq: Once | INTRAVENOUS | Status: AC | PRN
  Administered 2017-11-18: 75 mL via INTRAVENOUS

## 2017-11-19 ENCOUNTER — Ambulatory Visit (INDEPENDENT_AMBULATORY_CARE_PROVIDER_SITE_OTHER): Payer: Medicare Other | Admitting: *Deleted

## 2017-11-19 DIAGNOSIS — I482 Chronic atrial fibrillation: Secondary | ICD-10-CM

## 2017-11-19 DIAGNOSIS — I4821 Permanent atrial fibrillation: Secondary | ICD-10-CM

## 2017-11-19 NOTE — Progress Notes (Signed)
Remote pacemaker transmission.   

## 2017-12-05 ENCOUNTER — Observation Stay (HOSPITAL_COMMUNITY)
Admission: EM | Admit: 2017-12-05 | Discharge: 2017-12-06 | Disposition: A | Discharge status: Home / Self Care | Payer: Medicare Other | Attending: Family Medicine | Admitting: Family Medicine

## 2017-12-05 ENCOUNTER — Emergency Department (HOSPITAL_COMMUNITY): Payer: Medicare Other

## 2017-12-05 ENCOUNTER — Other Ambulatory Visit: Payer: Self-pay

## 2017-12-05 ENCOUNTER — Encounter (HOSPITAL_COMMUNITY): Payer: Self-pay | Admitting: Emergency Medicine

## 2017-12-05 DIAGNOSIS — I200 Unstable angina: Secondary | ICD-10-CM | POA: Diagnosis not present

## 2017-12-05 DIAGNOSIS — R0602 Shortness of breath: Secondary | ICD-10-CM | POA: Diagnosis not present

## 2017-12-05 DIAGNOSIS — R0789 Other chest pain: Secondary | ICD-10-CM | POA: Diagnosis not present

## 2017-12-05 DIAGNOSIS — I712 Thoracic aortic aneurysm, without rupture: Secondary | ICD-10-CM | POA: Diagnosis not present

## 2017-12-05 DIAGNOSIS — I119 Hypertensive heart disease without heart failure: Secondary | ICD-10-CM | POA: Diagnosis present

## 2017-12-05 DIAGNOSIS — I2510 Atherosclerotic heart disease of native coronary artery without angina pectoris: Secondary | ICD-10-CM | POA: Diagnosis not present

## 2017-12-05 DIAGNOSIS — I130 Hypertensive heart and chronic kidney disease with heart failure and stage 1 through stage 4 chronic kidney disease, or unspecified chronic kidney disease: Secondary | ICD-10-CM | POA: Insufficient documentation

## 2017-12-05 DIAGNOSIS — Z7982 Long term (current) use of aspirin: Secondary | ICD-10-CM | POA: Insufficient documentation

## 2017-12-05 DIAGNOSIS — Z950 Presence of cardiac pacemaker: Secondary | ICD-10-CM | POA: Diagnosis not present

## 2017-12-05 DIAGNOSIS — Z87891 Personal history of nicotine dependence: Secondary | ICD-10-CM | POA: Insufficient documentation

## 2017-12-05 DIAGNOSIS — J441 Chronic obstructive pulmonary disease with (acute) exacerbation: Secondary | ICD-10-CM | POA: Diagnosis present

## 2017-12-05 DIAGNOSIS — Z79899 Other long term (current) drug therapy: Secondary | ICD-10-CM | POA: Diagnosis not present

## 2017-12-05 DIAGNOSIS — I509 Heart failure, unspecified: Secondary | ICD-10-CM | POA: Insufficient documentation

## 2017-12-05 DIAGNOSIS — R079 Chest pain, unspecified: Secondary | ICD-10-CM | POA: Diagnosis not present

## 2017-12-05 DIAGNOSIS — N182 Chronic kidney disease, stage 2 (mild): Secondary | ICD-10-CM | POA: Insufficient documentation

## 2017-12-05 DIAGNOSIS — I10 Essential (primary) hypertension: Secondary | ICD-10-CM | POA: Diagnosis present

## 2017-12-05 DIAGNOSIS — I452 Bifascicular block: Secondary | ICD-10-CM | POA: Diagnosis present

## 2017-12-05 DIAGNOSIS — J449 Chronic obstructive pulmonary disease, unspecified: Secondary | ICD-10-CM | POA: Diagnosis present

## 2017-12-05 DIAGNOSIS — J438 Other emphysema: Secondary | ICD-10-CM | POA: Diagnosis not present

## 2017-12-05 DIAGNOSIS — K219 Gastro-esophageal reflux disease without esophagitis: Secondary | ICD-10-CM | POA: Diagnosis present

## 2017-12-05 DIAGNOSIS — I719 Aortic aneurysm of unspecified site, without rupture: Secondary | ICD-10-CM | POA: Diagnosis present

## 2017-12-05 HISTORY — DX: Dissection of thoracic aorta: I71.01

## 2017-12-05 HISTORY — DX: Sick sinus syndrome: I49.5

## 2017-12-05 HISTORY — DX: Aneurysm of the ascending aorta, without rupture: I71.21

## 2017-12-05 HISTORY — DX: Chronic atrial fibrillation, unspecified: I48.20

## 2017-12-05 HISTORY — DX: Thoracic aortic aneurysm, without rupture: I71.2

## 2017-12-05 HISTORY — DX: Nonrheumatic aortic (valve) stenosis: I35.0

## 2017-12-05 HISTORY — DX: Nonrheumatic aortic (valve) insufficiency: I35.1

## 2017-12-05 HISTORY — DX: Chronic diastolic (congestive) heart failure: I50.32

## 2017-12-05 HISTORY — DX: Rheumatic tricuspid insufficiency: I07.1

## 2017-12-05 LAB — BASIC METABOLIC PANEL
ANION GAP: 12 (ref 5–15)
BUN: 19 mg/dL (ref 8–23)
CALCIUM: 9.6 mg/dL (ref 8.9–10.3)
CO2: 27 mmol/L (ref 22–32)
Chloride: 104 mmol/L (ref 98–111)
Creatinine, Ser: 1.09 mg/dL — ABNORMAL HIGH (ref 0.44–1.00)
GFR calc Af Amer: 50 mL/min — ABNORMAL LOW (ref >60)
GFR, EST NON AFRICAN AMERICAN: 43 mL/min — AB (ref >60)
Glucose, Bld: 103 mg/dL — ABNORMAL HIGH (ref 70–99)
Potassium: 4.9 mmol/L (ref 3.5–5.1)
Sodium: 143 mmol/L (ref 135–145)

## 2017-12-05 LAB — CBC
HEMATOCRIT: 50.4 % — AB (ref 36.0–46.0)
Hemoglobin: 16.1 g/dL — ABNORMAL HIGH (ref 12.0–15.0)
MCH: 31.4 pg (ref 26.0–34.0)
MCHC: 31.9 g/dL (ref 30.0–36.0)
MCV: 98.2 fL (ref 78.0–100.0)
Platelets: 144 10*3/uL — ABNORMAL LOW (ref 150–400)
RBC: 5.13 MIL/uL — ABNORMAL HIGH (ref 3.87–5.11)
RDW: 12.9 % (ref 11.5–15.5)
WBC: 8.7 10*3/uL (ref 4.0–10.5)

## 2017-12-05 LAB — TROPONIN I

## 2017-12-05 LAB — I-STAT TROPONIN, ED: Troponin i, poc: 0.02 ng/mL (ref 0.00–0.08)

## 2017-12-05 MED ORDER — ALPRAZOLAM 0.25 MG PO TABS: 0.2500 mg | ORAL_TABLET | Freq: Two times a day (BID) | ORAL | Status: DC | PRN

## 2017-12-05 MED ORDER — ONDANSETRON HCL 4 MG/2ML IJ SOLN: 4.0000 mg | Freq: Four times a day (QID) | INTRAMUSCULAR | Status: DC | PRN

## 2017-12-05 MED ORDER — FUROSEMIDE 20 MG PO TABS
40.0000 mg | ORAL_TABLET | Freq: Every day | ORAL | Status: DC
  Administered 2017-12-06: 40 mg via ORAL
  Filled 2017-12-05: qty 2

## 2017-12-05 MED ORDER — FAMOTIDINE 20 MG PO TABS
20.0000 mg | ORAL_TABLET | Freq: Every day | ORAL | Status: DC
  Administered 2017-12-06: 20 mg via ORAL
  Filled 2017-12-05: qty 1

## 2017-12-05 MED ORDER — POTASSIUM CHLORIDE CRYS ER 20 MEQ PO TBCR
10.0000 meq | EXTENDED_RELEASE_TABLET | Freq: Every day | ORAL | Status: DC
  Administered 2017-12-06: 10 meq via ORAL
  Filled 2017-12-05: qty 1

## 2017-12-05 MED ORDER — GI COCKTAIL ~~LOC~~: 30.0000 mL | Freq: Four times a day (QID) | ORAL | Status: DC | PRN

## 2017-12-05 MED ORDER — IOPAMIDOL (ISOVUE-370) INJECTION 76%
100.0000 mL | Freq: Once | INTRAVENOUS | Status: AC | PRN
  Administered 2017-12-05: 100 mL via INTRAVENOUS

## 2017-12-05 MED ORDER — ASPIRIN EC 81 MG PO TBEC
81.0000 mg | DELAYED_RELEASE_TABLET | Freq: Every day | ORAL | Status: DC
  Administered 2017-12-06: 81 mg via ORAL
  Filled 2017-12-05: qty 1

## 2017-12-05 MED ORDER — DABIGATRAN ETEXILATE MESYLATE 75 MG PO CAPS
75.0000 mg | ORAL_CAPSULE | Freq: Two times a day (BID) | ORAL | Status: DC
  Administered 2017-12-05 – 2017-12-06 (×2): 75 mg via ORAL
  Filled 2017-12-05 (×2): qty 1

## 2017-12-05 MED ORDER — MORPHINE SULFATE (PF) 4 MG/ML IV SOLN: 1.0000 mg | INTRAVENOUS | Status: DC | PRN

## 2017-12-05 MED ORDER — ACETAMINOPHEN 325 MG PO TABS: 650.0000 mg | ORAL_TABLET | ORAL | Status: DC | PRN

## 2017-12-05 MED ORDER — MAGNESIUM OXIDE 400 (241.3 MG) MG PO TABS
400.0000 mg | ORAL_TABLET | Freq: Every day | ORAL | Status: DC
  Administered 2017-12-06: 400 mg via ORAL
  Filled 2017-12-05: qty 1

## 2017-12-05 MED ORDER — IOPAMIDOL (ISOVUE-370) INJECTION 76%
INTRAVENOUS | Status: AC
  Filled 2017-12-05: qty 100

## 2017-12-05 MED ORDER — ROSUVASTATIN CALCIUM 5 MG PO TABS
5.0000 mg | ORAL_TABLET | Freq: Every evening | ORAL | Status: DC
  Administered 2017-12-05: 5 mg via ORAL
  Filled 2017-12-05 (×2): qty 1

## 2017-12-05 MED ORDER — METOPROLOL SUCCINATE ER 100 MG PO TB24
100.0000 mg | ORAL_TABLET | Freq: Every day | ORAL | Status: DC
  Administered 2017-12-06: 100 mg via ORAL
  Filled 2017-12-05: qty 1
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
## 2017-12-05 NOTE — Progress Notes (Signed)
[facility] night doc: Spoke with Melina Copa, PA-C Cards ordered a delta trop, if neg, patient may be considered stable for dc to home .Results are still pending  Please follow up delta troponin results. Thank you
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
## 2017-12-05 NOTE — ED Notes (Signed)
Attempted to call report x 1 to [facility] [place] unsuccessfully.

## 2017-12-05 NOTE — ED Triage Notes (Signed)
Pt arrives via POV from home with left sided chest pain that began at approx 9am. States pain radiated to left neck lasted about 33minutes. Then went away on its own. States SOB and weakness with the pain. States recently has aortic dissection repair. States pain began again at 1145 and her family wants her checked out. VSS.

## 2017-12-06 ENCOUNTER — Other Ambulatory Visit: Payer: Self-pay

## 2017-12-06 DIAGNOSIS — Z7982 Long term (current) use of aspirin: Secondary | ICD-10-CM | POA: Diagnosis not present

## 2017-12-06 DIAGNOSIS — R0789 Other chest pain: Secondary | ICD-10-CM

## 2017-12-06 DIAGNOSIS — I150 Renovascular hypertension: Secondary | ICD-10-CM | POA: Diagnosis not present

## 2017-12-06 DIAGNOSIS — N182 Chronic kidney disease, stage 2 (mild): Secondary | ICD-10-CM | POA: Diagnosis not present

## 2017-12-06 DIAGNOSIS — I712 Thoracic aortic aneurysm, without rupture: Secondary | ICD-10-CM | POA: Diagnosis not present

## 2017-12-06 DIAGNOSIS — I200 Unstable angina: Secondary | ICD-10-CM | POA: Diagnosis not present

## 2017-12-06 DIAGNOSIS — I130 Hypertensive heart and chronic kidney disease with heart failure and stage 1 through stage 4 chronic kidney disease, or unspecified chronic kidney disease: Secondary | ICD-10-CM | POA: Diagnosis not present

## 2017-12-06 DIAGNOSIS — Z950 Presence of cardiac pacemaker: Secondary | ICD-10-CM

## 2017-12-06 DIAGNOSIS — J438 Other emphysema: Secondary | ICD-10-CM | POA: Diagnosis not present

## 2017-12-06 DIAGNOSIS — Z87891 Personal history of nicotine dependence: Secondary | ICD-10-CM | POA: Diagnosis not present

## 2017-12-06 DIAGNOSIS — K219 Gastro-esophageal reflux disease without esophagitis: Secondary | ICD-10-CM | POA: Diagnosis not present

## 2017-12-06 DIAGNOSIS — I509 Heart failure, unspecified: Secondary | ICD-10-CM | POA: Diagnosis not present

## 2017-12-06 DIAGNOSIS — Z79899 Other long term (current) drug therapy: Secondary | ICD-10-CM | POA: Diagnosis not present

## 2017-12-06 LAB — TROPONIN I

## 2017-12-06 NOTE — Discharge Summary (Signed)
Physician Discharge Summary [facility]    Patient: [name]                   Admit date: 12/05/2017   DOB: 03/26/1926             Discharge date:12/06/2017/10:58 AM KPT:[id number]                           PCP: Merrilee Seashore, MD Recommendations for Outpatient Follow-up:   1.  Please follow-up with your primary care physician within 1-2 weeks. 2.  F/up with Cardiologist in 1-2 wks   Discharge Condition: Stable  CODE STATUS:  Full code   /  DNR/DNI      Diet recommendation:  Cardiac diet ----------------------------------------------------------------------------------------------------------------------  Discharge Diagnoses:   Principal Problem:   Chest pain Active Problems:   GERD   Right bundle branch block with left anterior fascicular block   HTN (hypertension)   CAD, moderate   COPD, by CXR, followed by Dr Annamaria Boots   Pacemaker   Hypertensive heart disease without heart failure   Aortic aneurysm (HCC)   History of present illness :  [name] is a 84 y.o. female with medical history significant for chronic atrial fibrillation, status post prior A. fib ablation history of tachybradycardia syndrome status post pacemaker placement, type B aortic dissection in June 2019, managed medically, and a history of 4.2 cm ascending aortic aneurysm, with a 4.7 cm infrarenal aortic aneurysm, chronic diastolic heart failure, hypertension, history of COPD, thrombocytopenia, history of right melanoma behind the eye, history of COPD, fibromuscular dysplasia, hyperlipidemia, moderate CAD in 2016, last echo in January 2018 with EF 65 to 70%, mild left ear, moderate AI, mild a MR, moderate TR, presenting with left-sided chest pain, radiating up to into the arm and the neck on the left, occurring this morning around 8:00, and then at 12:00, lasting for about 30 minutes, resolved at the time of presentation.  She describes these as different as the one that she experience with  her rate aortic dissection.  She denies any shortness of breath, cough, nausea, vomiting, syncope, presyncope, or dizziness.  The patient denies any palpitations.  She denies any abdominal pain, or bleeding issues such as hematuria, hematochezia, hematemesis or epistaxis.  She denies any numbness, or unilateral weakness.  She denies any headaches.  She is essentially asymptomatic. ED Course:  BP 135/83   Pulse 69   Temp 98.4 F (36.9 C) (Oral)   Resp (!) 24   Ht 5\' 2"  (1.575 m)   Wt 60.8 kg (134 lb)   SpO2 98%   BMI 24.51 kg/m   CT of the chest showed stable to slightly less conspicuous type B dissection, without acute findings. Chest x-ray shows COPD otherwise NAD Creatinine 1.09 Hemoglobin 16.1 Troponin negative Nonspecific renal lesions were seen in the CT of the chest, which were suspicious for cysts versus small renal neoplasms, these findings to be incidental. EKG left chronic ventricular pacemaker, without significant change. Cardiology consulted the patient, concluding that to continue medical therapy, although atrial fibrillation can potentially cause anginal symptoms.  No catheterization is indicated at this time.   Hospital course / Brief Summary:  Was admitted for Chest pain. Cardiology was consulted.   Chest pain syndrome/known CAD in a patient with multiple risk factors and recent admission for Ao dissection   Chest pain resolved this AM.   Troponins were neg , EKG without evidence of acute  changes.  last echo in January 2018 with EF 65 to 70%, mild left ear, moderate AI, mild a MR, moderate TR, CT of the chest showed stable to slightly less conspicuous type B dissection, without acute findings.Chest x-ray shows COPD otherwise NAD.   Admit to Telemetry/ ObservationCardiology consulted the patient, concluding that to continue medical therapy, although atrial fibrillation can potentially cause anginal symptoms.  No catheterization is indicated at this time. continue ASA, O2  and NTG as needed, Statins   Cardiology has cleared the Patient for Discharge.   Hypertension BP 135/83   Pulse 69  Controlled Continue home anti-hypertensive medications, no changes    Hyperlipidemia Continue home statins  Chronic kidney disease stage 2  Current Cr is 1.09 Nonspecific renal lesions were seen in the CT of the chest, which were suspicious for cysts versus small renal neoplasms, these findings to be incidental.  Disposition - D/C home   Consultations: Card: Dr. Marlou Porch  ------------------------------------------------------------------------------------------------------------------------------------------------  Discharge Instructions:   Discharge Instructions    Activity as tolerated - No restrictions   Complete by:  As directed    Call MD for:  difficulty breathing, headache or visual disturbances   Complete by:  As directed    Call MD for:  severe uncontrolled pain   Complete by:  As directed    Diet - low sodium heart healthy   Complete by:  As directed    Discharge instructions   Complete by:  As directed    F/up with your Cardiologist in 1-2 wks.   Increase activity slowly   Complete by:  As directed        Medication List    TAKE these medications   aspirin 81 MG tablet Take 1 tablet (81 mg total) by mouth daily.   CALCIUM + D PO Take 1 tablet by mouth 2 (two) times daily.   dabigatran 75 MG Caps capsule Commonly known as:  PRADAXA TAKE ONE CAPSULE BY MOUTH EVERY 12 HOURS What changed:    how much to take  how to take this  when to take this  additional instructions   Fish Oil 1000 MG Caps Take 1,000 mg by mouth daily.   fluticasone 50 MCG/ACT nasal spray Commonly known as:  FLONASE Place 1 spray into both nostrils daily as needed for allergies or rhinitis.   furosemide 40 MG tablet Commonly known as:  LASIX Take 1 tablet (40 mg total) by mouth daily.   loratadine 10 MG tablet Commonly known as:  CLARITIN Take 10 mg by  mouth daily as needed for allergies.   magnesium oxide 400 (241.3 Mg) MG tablet Commonly known as:  MAG-OX Take 1 tablet (400 mg total) by mouth daily.   metoprolol succinate 100 MG 24 hr tablet Commonly known as:  TOPROL-XL TAKE 1 TABLET BY MOUTH DAILY WITH OR IMMEDIATELY FOLLOWING A MEAL   multivitamin with minerals Tabs tablet Take 1 tablet by mouth daily.   potassium chloride 10 MEQ tablet Commonly known as:  K-DUR,KLOR-CON Take 10 mEq by mouth daily.   PRESERVISION AREDS 2 Caps Take 1 capsule by mouth 2 (two) times daily.   ranitidine 150 MG tablet Commonly known as:  ZANTAC Take 150 mg by mouth daily.   rosuvastatin 5 MG tablet Commonly known as:  CRESTOR Take 1 tablet (5 mg total) by mouth every evening.   SYSTANE OP Place 2 drops into both eyes 2 (two) times daily as needed (for dry eyes).  Allergies  Allergen Reactions  . Tramadol Other (See Comments)    Medication made her feel "off and talk to herself"  . Protonix [Pantoprazole Sodium] Other (See Comments)    Abdominal pain      Procedures/Studies: Dg Chest 2 View  Result Date: 12/05/2017 CLINICAL DATA:  Left-sided chest pain for several hours EXAM: CHEST - 2 VIEW COMPARISON:  11/18/2017 FINDINGS: Cardiac shadows within normal limits. Dual lead pacing device is noted and stable. Lungs are hyper aerated bilaterally. No focal infiltrate or sizable effusion is seen. Multiple embolization coils are noted in the upper abdomen. No acute bony abnormality is noted. IMPRESSION: COPD without acute abnormality. Electronically Signed   By: Inez Catalina M.D.   On: 12/05/2017 13:59   Ct Angio Chest Aorta W &/or Wo Contrast  Result Date: 11/18/2017 CLINICAL DATA:  84 year old female with a history aortic dissection and aneurysm. Follow-up evaluation. EXAM: CT ANGIOGRAPHY CHEST, ABDOMEN AND PELVIS TECHNIQUE: Multidetector CT imaging through the chest, abdomen anght apical pleuroparenchymal scarring. No significant  emphysematous changes, focal airspace consolidation, pulmonary edema, pleural effusion or pneumothorax. Musculoskeletal: No acute fracture or aggressive appearing lytic or blastic osseous lesion. Review of the MIP images confirms the above findings. CTA ABDOMEN AND PELVIS FINDINGS VASCULAR Aorta: Stable fusiform aneurysmal dilatation of the infrarenal abdominal aorta with maximal dimensions of 4.7 x 4.0 cm, no interval change compared to recent prior imaging. Atherosclerotic plaque present throughout the abdominal aorta. Celiac: Variant anatomy. The right hepatic artery is replaced to the SMA. Mild atherosclerotic plaque at the origin with perhaps mild narrowing. The left hepatic artery is replaced to the left gastric artery and is coil embolized. Coil embolization of probable aneurysm in the region of the porta hepatis, likely the middle hepatic artery. SMA: Widely patent.  Replaced right hepatic artery. Renals: Beaded appearance of the renal arteries bilaterally suggests underlying fibromuscular dysplasia. No evidence of aneurysm or dissection. No significant stenosis. IMA: Patent without evidence of aneurysm, dissection, vasculitis or significant stenosis. Inflow: Ectatic right common iliac artery measuring up to 1.4 cm. The iliac arteries are tortuous. Scattered atherosclerotic plaque. No dissection, aneurysm or significant stenosis. Veins: No focal venous abnormality. Review of the MIP images confirms the above findings. NON-VASCULAR Hepatobiliary: Pneumobilia consistent with prior sphincterotomy. Normal morphology. No discrete hepatic lesion. No significant intrahepatic biliary ductal dilatation. The extrahepatic bile duct is mildly prominent at 1.1 cm but unchanged. The gallbladder is surgically absent. Pancreas: Unremarkable. No pancreatic ductal dilatation or surrounding inflammatory changes. Spleen: Normal in size without focal abnormality. Adrenals/Urinary Tract: Dystrophic calcification in the right  adrenal gland. Mild adreniform thickening bilaterally likely reflecting adrenal hyperplasia. No discrete masses. No evidence of hydronephrosis, nephrolithiasis or definitive enhancing renal mass. Peripherally calcified central fat attenuation lesion exophytic from the left kidney is unchanged in size at 2.8 cm and may represent an angiomyolipoma versus a prior cryo ablation defect. Small low-attenuation lesions bilaterally are too small to characterize but unchanged compared to recent prior imaging. Indeterminate 0.8 cm lesion arising from the lower pole of the right kidney. 1.0 cm indeterminate lesion in the anterior interpolar right kidney. 0.8 cm indeterminate lesion arising from the lower pole the left kidney. The ureters and bladder are unremarkable. Stomach/Bowel: Colonic diverticular disease without CT evidence of active inflammation. Redundant sigmoid colon. No focal bowel wall thickening or evidence of obstruction. Normal appendix in the right lower quadrant. Lymphatic: No suspicious adenopathy. Reproductive: Uterus and bilateral adnexa are unremarkable. Other: No abdominal wall hernia or abnormality. No abdominopelvic  ascites. Musculoskeletal: No acute fracture or aggressive appearing lytic or blastic osseous lesion. Remote healed bilateral inferior pubic rami fractures. Remote healed bilateral superior pubic rami fractures. The bones appear diffusely demineralized. Multilevel degenerative disc disease. Remote L3 compression fracture status post cement augmentation. Review of the MIP images confirms the above findings. IMPRESSION: CTA CHEST 1. Stable to slightly less conspicuous Stanford type B thoracic aortic dissection which appears to involve only the descending thoracic aorta. Recommend annual imaging followup by CTA or MRA. This recommendation follows 2010 ACCF/AHA/AATS/ACR/ASA/SCA/SCAI/SIR/STS/SVM Guidelines for the Diagnosis and Management of Patients with Thoracic Aortic Disease. Circulation. 2010;  121: U981-X914 2. Stable fusiform aneurysmal dilatation of the tubular portion of the ascending thoracic aorta with a maximal diameter of 4.3 cm. Aortic aneurysm NOS (ICD10-I71.9). 3. Thickened aortic valve suggests underlying aortic stenosis. 4.  Aortic Atherosclerosis (ICD10-170.0). CTA ABD/PELVIS 1. Stable fusiform aneurysmal dilatation of the infrarenal abdominal aorta with a maximal diameter of 4.7 cm compared to the most recent prior imaging, but enlarged compared to November of 2018 when the aneurysm measured a maximum of 4.4 cm. Recommend followup by abdomen and pelvis CTA in 6 months, and vascular surgery referral/consultation if not already obtained. This recommendation follows ACR consensus guidelines: White Paper of the ACR Incidental Findings Committee II on Vascular Findings. J Am Coll Radiol 2013; 10:789-794. 2. Probable fibromuscular dysplasia affecting both renal arteries. 3. Evidence of prior coil embolization of multiple aneurysmal hepatic arteries. No evidence of residual hepatic artery aneurysm. 4. Nonspecific bilateral renal lesions demonstrate very little change compared to November of 2018 and may represent complex cysts, or small renal neoplasms. Recommend attention on continued routine follow-up imaging. 5. Additional ancillary findings as above without significant interval change. Signed, Criselda Peaches, MD [facility] Electronically Signed   By: Jacqulynn Cadet M.D.   On: 11/18/2017 17:07   Ct Angio Chest/abd/pel For Dissection W And/or Wo Contrast  Result Date: 12/05/2017 CLINICAL DATA:  84 year old female with acute chest and abdominal pain with shortness of breath. History of recent Stanford type B thoracic aortic dissection repair. EXAM: CT ANGIOGRAPHY CHEST, ABDOMEN AND PELVIS TECHNIQUE: Multidetector CT imaging through the chest, abdomen and pelvis was performed using the standard protocol during bolus  administration of intravenous contrast. Multiplanar reconstructed images and MIPs were obtained and reviewed to evaluate the vascular anatomy. CONTRAST:  138mL ISOVUE-370 IOPAMIDOL (ISOVUE-370) INJECTION 76% COMPARISON:  11/18/2017 and prior CTs. FINDINGS: CTA CHEST FINDINGS Cardiovascular: Unchanged ill-defined wispy irregular filling defect within the descending thoracic aorta extending into the abdomen is unchanged and compatible with Stanford B dissection. Unchanged ascending thoracic aortic aneurysm measuring 4.3 cm again noted. Mild atherosclerotic calcification at the origin of the great vessels noted without evidence of dissection. Cardiomegaly and coronary artery atherosclerotic calcifications again identified. There is no evidence of pericardial effusion. LEFT subclavian pacemaker again noted. Mediastinum/Nodes: No enlarged mtures and vertebral augmentation changes of L3 again noted. Review of the MIP images confirms the above findings. IMPRESSION: 1. No evidence of acute abnormality. No significant change from 11/18/2017 CT. 2. Unchanged appearance of Stanford B type aortic dissection. Unchanged aneurysms of the ascending thoracic aorta and abdominal aorta as described. 3. Cardiomegaly and coronary artery disease. Electronically Signed   By: Margarette Canada M.D.   On: 12/05/2017 16:13   Ct Angio Abdomen Pelvis  W &/or Wo Contrast  Result Date: 11/18/2017 CLINICAL DATA:  84 year old female with a history aortic dissection and aneurysm. Follow-up evaluation. EXAM: CT ANGIOappearing lytic  or blastic osseous lesion. Review of the MIP images confirms the above findings. CTA ABDOMEN AND PELVIS FINDINGS VASCULAR Aorta: Stable fusiform aneurysmal dilatation of the infrarenal e. Stomach/Bowel: Colonic diverticular disease without CT evidence of active inflammation. Redundant sigmoid colon. No focal bowel wall thickening or evidelarged compared to November of 2018 when the aneurysm measured a maximum of 4.4 cm.  Recommend followup by abdomen and pelvis CTA in 6 months, and vascular surgery referral/consultation if not already obtained. This recommendation follows ACR consensus guidelines: White Paper of the ACR Incidental Findings Committee II on Vascular Findings. J Am Coll Radiol 2013; 10:789-794. 2. Probable fibromuscular dysplasia affecting both renal arteries. 3. Evidence of prior coil embolization of multiple aneurysmal hepatic arteries. No evidence of residual hepatic artery aneurysm. 4. Nonspecific bilateral renal lesions demonstrate very little change compared to November of 2018 and may represent complex cysts, or small renal neoplasms. Recommend attention on continued routine follow-up imaging. 5. Additional ancillary findings as above without significant interval change. Signed, Criselda Peaches, MD [facility] Electronically Signed   By: Jacqulynn Cadet M.D.   On: 11/18/2017 17:07     Subjective: Patient was seen and examined 12/06/2017, 10:58 AM Patient stable  Today. No acute distress.  No issues overnight Stable for discharge.  Discharge Exam:  Vitals:   12/05/17 1834 12/05/17 2154 12/06/17 0433 12/06/17 0802  BP: (!) 151/86 (!) 143/81 137/73 140/77  Pulse: 76 70 96 72  Resp:  19 19   Temp: (!) 97.4 F (36.3 C)  97.9 F (36.6 C) 97.8 F (36.6 C)  TempSrc: Oral  Oral Oral  SpO2: 98% 98% 97% 97%  Weight: 58.4 kg (128 lb 12.8 oz)  61.4 kg (135 lb 4.8 oz)   Height: 5\' 2"  (1.575 m)       General: Pt lying comfortably in bed & appears in no obvious distress. Cardiovascular: S1 & S2 heard, RRR, S1/S2 +. No murmurs, rubs, gallops or clicks. No JVD or pedal edema. Respiratory: Clear to auscultation without wheezing, rhonchi or crackles. No increased work of breathing. Abdominal:  Non distended, non tender & soft. No organomegaly or masses appreciated. Normal bowel sounds heard. CNS: Alert and oriented. No focal  deficits. Extremities: no edema, no cyanosis    The results of significant diagnostics from this hospitalization (including imaging, microbiology, ancillary and laboratory) are listed below for reference.     Microbiology: No results found for this or any previous visit (from the past 240 hour(s)).   Labs: CBC: Recent Labs  Lab 12/05/17 1322  WBC 8.7  HGB 16.1*  HCT 50.4*  MCV 98.2  PLT 852*   Basic Metabolic Panel: Recent Labs  Lab 12/05/17 1322  NA 143  K 4.9  CL 104  CO2 27  GLUCOSE 103*  BUN 19  CREATININE 1.09*  CALCIUM 9.6   BNP (last 3 results) Recent Labs    06/25/17 1457  BNP 391.2*   Cardiac Enzymes: Recent Labs  Lab 12/05/17 1838 12/06/17 0856  TROPONINI <0.03 <0.03      Component Value Date/Time   COLORURINE YELLOW 12/28/2016 1915   APPEARANCEUR HAZY (A) 12/28/2016 1915   LABSPEC 1.018 12/28/2016 1915   PHURINE 5.0 12/28/2016 1915   GLUCOSEU NEGATIVE 12/28/2016 1915   HGBUR NEGATIVE 12/28/2016 1915   BILIRUBINUR NEGATIVE 12/28/2016 [place] 12/28/2016 1915   PROTEINUR NEGATIVE 12/28/2016 1915   UROBILINOGEN 0.2 09/16/2014 1513   NITRITE NEGATIVE 12/28/2016 1915  LEUKOCYTESUR MODERATE (A) 12/28/2016 1915    Time coordinating discharge: Over 30 minutes  SIGNED: Deatra James, MD, FACP, [facility]. [facility],  Pager [phone]  If 7PM-7AM, please contact night-coverage [email or web address] Ota [facility] 12/06/2017, 10:58 AM

## 2017-12-06 NOTE — Progress Notes (Signed)
Discharge instruction was given to pt and family. Belongings were sent home with pt.  Idolina Primer, RN

## 2017-12-06 NOTE — Discharge Instructions (Signed)
Information on my medicine - Pradaxa® (dabigatran) ° °This medication education was reviewed with me or my healthcare representative as part of my discharge preparation.  The pharmacist that spoke with me during my hospital stay was:  Nejla Reasor T Luismanuel Corman, RPH ° °Why was Pradaxa® prescribed for you? °Pradaxa® was prescribed for you to reduce the risk of forming blood clots that cause a stroke if you have a medical condition called atrial fibrillation (a type of irregular heartbeat).   ° °What do you Need to know about PradAXa®? °Take your Pradaxa® TWICE DAILY - one capsule in the morning and one tablet in the evening with or without food.  It would be best to take the doses about the same time each day. ° °The capsules should not be broken, chewed or opened - they must be swallowed whole. ° °Do not store Pradaxa in other medication containers - once the bottle is opened the Pradaxa should be used within FOUR months; throw away any capsules that haven’t been by that time. ° °Take Pradaxa® exactly as prescribed by your doctor.  DO NOT stop taking Pradaxa® without talking to the doctor who prescribed the medication.  Stopping without other stroke prevention medication to take the place of Pradaxa may increase your risk of developing a clot that causes a stroke.  Refill your prescription before you run out. ° °After discharge, you should have regular check-up appointments with your healthcare provider that is prescribing your Pradaxa®.  In the future your dose may need to be changed if your kidney function or weight changes by a significant amount. ° °What do you do if you miss a dose? °If you miss a dose, take it as soon as you remember on the same day.  If your next dose is less than 6 hours away, skip the missed dose.  Do not take two doses of PRADAXA at the same time. ° °Important Safety Information °A possible side effect of Pradaxa® is bleeding. You should call your healthcare provider right away if you experience  any of the following: °? Bleeding from an injury or your nose that does not stop. °? Unusual colored urine (red or dark brown) or unusual colored stools (red or black). °? Unusual bruising for unknown reasons. °? A serious fall or if you hit your head (even if there is no bleeding). ° °Some medicines may interact with Pradaxa® and might increase your risk of bleeding or clotting while on Pradaxa®. To help avoid this, consult your healthcare provider or pharmacist prior to using any new prescription or non-prescription medications, including herbals, vitamins, non-steroidal anti-inflammatory drugs (NSAIDs) and supplements. ° °This website has more information on Pradaxa® (dabigatran): [email or web address].pradaxa.com ° ° ° ° °

## 2017-12-06 NOTE — Progress Notes (Signed)
Progress Note  Patient Name: [name] Date of Encounter: 12/06/2017  Primary Cardiologist: Sanda Klein, MD   Subjective   Overall she is feeling well.  She is having no further chest pain.  Inpatient Medications    Scheduled Meds: . aspirin EC  81 mg Oral Daily  . dabigatran  75 mg Oral Q12H  . famotidine  20 mg Oral Daily  . furosemide  40 mg Oral Daily  . magnesium oxide  400 mg Oral Daily  . metoprolol succinate  100 mg Oral Daily  . potassium chloride  10 mEq Oral Daily  . rosuvastatin  5 mg Oral QPM   Continuous Infusions:  PRN Meds: acetaminophen, ALPRAZolam, gi cocktail, morphine injection, ondansetron (ZOFRAN) IV   Vital Signs    Vitals:   12/05/17 1834 12/05/17 2154 12/06/17 0433 12/06/17 0802  BP: (!) 151/86 (!) 143/81 137/73 140/77  Pulse: 76 70 96 72  Resp:  19 19   Temp: (!) 97.4 F (36.3 C)  97.9 F (36.6 C) 97.8 F (36.6 C)  TempSrc: Oral  Oral Oral  SpO2: 98% 98% 97% 97%  Weight: 128 lb 12.8 oz (58.4 kg)  135 lb 4.8 oz (61.4 kg)   Height: 5\' 2"  (1.575 m)      No intake or output data in the 24 hours ending 12/06/17 0833 Filed Weights   12/05/17 1313 12/05/17 1834 12/06/17 0433  Weight: 134 lb (60.8 kg) 128 lb 12.8 oz (58.4 kg) 135 lb 4.8 oz (61.4 kg)    Telemetry    Atrial fibrillation, ventricular paced- Personally Reviewed  ECG    Atrial fibrillation, ventricular paced- Personally Reviewed  Physical Exam   GEN: No acute distress.   Neck: No JVD Cardiac: RRR, no murmurs, rubs, or gallops.  Respiratory: Clear to auscultation bilaterally. GI: Soft, nontender, non-distended  MS: No edema; No deformity. Neuro:  Nonfocal  Psych: Normal affect   Labs    Chemistry Recent Labs  Lab 12/05/17 1322  NA 143  K 4.9  CL 104  CO2 27  GLUCOSE 103*  BUN 19  CREATININE 1.09*  CALCIUM 9.6  GFRNONAA 43*  GFRAA 50*  ANIONGAP 12     Hematology Recent Labs  Lab 12/05/17 1322  WBC 8.7  RBC 5.13*  HGB 16.1*  HCT 50.4*    MCV 98.2  MCH 31.4  MCHC 31.9  RDW 12.9  PLT 144*    Cardiac Enzymes Recent Labs  Lab 12/05/17 1838  TROPONINI <0.03    Recent Labs  Lab 12/05/17 1351  TROPIPOC 0.02     BNPNo results for input(s): BNP, PROBNP in the last 168 hours.   DDimer No results for input(s): DDIMER in the last 168 hours.   Radiology    Dg Chest 2 View  Result Date: 12/05/2017 CLINICAL DATA:  Left-sided chest pain for several hours EXAM: CHEST - 2 VIEW COMPARISON:  11/18/2017 FINDINGS: Cardiac shadows within normal limits. Dual lead pacing device is noted and stable. Lungs are hyper aerated bilaterally. No focal infiltrate or sizable effusion is seen. Multiple embolization coils are noted in the upper abdomen. No acute bony abnormality is noted. IMPRESSION: COPD without acute abnormality. Electronically Signed   By: Inez Catalina M.D.   On: 12/05/2017 13:59   Ct Angio Chest/abd/pel For Dissection W And/or Wo Contrast  Result Date: 12/05/2017 CLINICAL DATA:  84 year old female with acute chest and abdominal pain with shortness of breath. History of recent Stanford type B thoracic aortic dissection repair.  EXAM: CT ANGIOGRAPHY CHEST, ABDOMEN AND PELVIS TECHNIQUE: Multidetector CT imaging through the chest, abdomen and pelvis was performed using the standard protocol during bolus administration of intravenous contrast. Multiplanar reconstructed images and MIPs were obtained and reviewed to evaluate the vascular anatomy. CONTRAST:  127mL ISOVUE-370 IOPAMIDOL (ISOVUE-370) INJECTION 76% COMPARISON:  11/18/2017 and prior CTs. FINDINGS: CTA CHEST FINDINGS Cardiovascular: Unchanged ill-defined wispy irregular filling defect within the descending thoracic aorta extending into the abdomen is unchanged and compatible with Stanford B dissection. Unchanged ascending thoracic aortic aneurysm measuring 4.3 cm again noted. Mild atherosclerotic calcification at the origin of the great vessels noted without evidence of  dissection. Cardiomegaly and coronary artery atherosclerotic calcifications again identified. There is no evidence of pericardial effusion. LEFT subclavian pacemaker again noted. Mediastinum/Nodes: No enlarged mediastinal, hilar, or axillary lymph nodes. Small thyroid nodules are unchanged. Bilateral mid-lower thoracic paravertebral masses/cysts are unchanged. Unchanged 9 mm precarinal node. Lungs/Pleura: No acute abnormality. No airspace disease, consolidation, suspicious nodule, mass, pleural effusion or pneumothorax. Musculoskeletal: No acute abnormality or suspicious bony lesion. Review of the MIP images confirms the above findings. CTA ABDOMEN AND PELVIS FINDINGS VASCULAR Aorta: Stable fusiform aneurysm of the infrarenal suprailiac abdominal aorta measuring 4 x 4.7 cm (series 8: Image 128). Wispy ill-defined filling defect/dissection within the mid abdominal aorta is unchanged. Aortic atherosclerotic calcifications noted. Celiac: No acute changes. Mild atherosclerotic plaque at the origin again noted. LEFT hepatic artery is replaced to the LEFT gastric artery. COIL embolization of probable aneurysm in the region the porta hepatis again noted. SMA: Unchanged, patent without evidence of dissection. Renals: Unchanged, patent without evidence of dissection. Unchanged mildly beaded appearance. IMA: Unchanged, patent without evidence of dissection or aneurysm. Inflow: Ectatic RIGHT common iliac artery again noted. Hip tortuous iliac arteries again noted. Review of the MIP images confirms the above findings. NON-VASCULAR Hepatobiliary: Unchanged with pneumobilia. No definite focal hepatic abnormalities. Pancreas: Unchanged.  No acute abnormality. Spleen: Unremarkable Adrenals/Urinary Tract: A 2.8 cm fatty lesion/angiomyolipoma extending off of the LOWER LEFT kidney is unchanged. An 8 mm exophytic mass extending off the LOWER RIGHT kidney is unchanged. No hydronephrosis noted. Fullness of the adrenal glands again  noted. Stomach/Bowel: Stomach is within normal limits. No evidence of bowel wall thickening, distention, or inflammatory changes. Colonic diverticulosis noted without evidence of diverticulitis. Lymphatic: No enlarged lymph nodes. Reproductive: Status post hysterectomy. No adnexal masses. Other: No ascites, focal collection or pneumoperitoneum. Musculoskeletal: No acute abnormality. Compression fractures and vertebral augmentation changes of L3 again noted. Review of the MIP images confirms the above findings. IMPRESSION: 1. No evidence of acute abnormality. No significant change from 11/18/2017 CT. 2. Unchanged appearance of Stanford B type aortic dissection. Unchanged aneurysms of the ascending thoracic aorta and abdominal aorta as described. 3. Cardiomegaly and coronary artery disease. Electronically Signed   By: Margarette Canada M.D.   On: 12/05/2017 16:13    Cardiac Studies   TTE 06/12/17 - Left ventricle: The cavity size was normal. There was moderate   concentric hypertrophy. Systolic function was vigorous. The   estimated ejection fraction was in the range of 65% to 70%. Wall   motion was normal; there were no regional wall motion   abnormalities. The study is not technically sufficient to allow   evaluation of LV diastolic function. - Aortic valve: Trileaflet; severely thickened, severely calcified   leaflets. Valve mobility was restricted. There was very mild   stenosis. There was moderate regurgitation. Mean gradient (S): 8   mm Hg. Valve area (VTI):  1.61 cm^2. Valve area (Vmax): 1.12 cm^2.   Valve area (Vmean): 1.4 cm^2. - Mitral valve: Calcified annulus. Mildly thickened leaflets .   There was mild regurgitation. - Right ventricle: Pacer wire or catheter noted in right ventricle.   Systolic function was normal. - Right atrium: Pacer wire or catheter noted in right atrium. - Tricuspid valve: There was moderate regurgitation. - Pulmonary arteries: Systolic pressure was mildly increased.  PA   peak pressure: 32 mm Hg (S). - Inferior vena cava: The vessel was normal in size. - Pericardium, extracardiac: There was no pericardial effusion.  Patient Profile     84 y.o. female with history of chronic atrial fibrillation, tachybradycardia syndrome status post pacemaker, type B aortic  dissection, among others who presented to the hospital with chest pain.  Assessment & Plan    1.  Chest pain: Troponin has been negative x2.  She is currently chest pain-free and has not had chest pain since her episode yesterday.  At this point no further changes.  Is  2.  Type B aortic dissection: Stable by repeat imaging.  No changes.  3.  Hypertension: Elevated today, goal blood pressure less than 130.  Blood pressure has been in the 120s this admission.   4.  Stage II CKD: Per internal medicine  5.  Chronic atrial fibrillation: Continue Pradaxa.  This patients CHA2DS2-VASc Score and unadjusted Ischemic Stroke Rate (% per year) is equal to 7.2 % stroke rate/year from a score of 5  Above score calculated as 1 point each if present [CHF, HTN, DM, Vascular=MI/PAD/Aortic Plaque, Age if 65-74, or Female] Above score calculated as 2 points each if present [Age > 75, or Stroke/TIA/TE]    [facility] Valeree Leidy sign off.   Medication Recommendations: None new Follow up as an outpatient: 2 weeks with see [facility]  For questions or updates, please contact [place] Please consult [email or web address] for contact info under Cardiology/STEMI.      Signed, Shatarra Wehling Meredith Leeds, MD  12/06/2017, 8:33 AM

## 2017-12-10 ENCOUNTER — Telehealth: Payer: Self-pay | Admitting: Cardiovascular Disease

## 2017-12-10 NOTE — Telephone Encounter (Signed)
New message    Patient declined to schedule follow up. States she feels fine and does not want appt.   Per staff message: Please schedule this patient for a follow-up appointment and call them with that information.   Primary Cardiologist: Croitoru  Date of Discharge: 12/05/2017  Appointment Needed Within: 2-3 weeks  Appointment Type: ER f/u - OK for APP

## 2017-12-24 LAB — CUP PACEART REMOTE DEVICE CHECK
Battery Remaining Longevity: 42 mo
Battery Remaining Percentage: 70 %
Brady Statistic RV Percent Paced: 99 %
Date Time Interrogation Session: 20190710053100
Implantable Lead Location: 753860
Implantable Lead Model: 4135
Implantable Lead Model: 4136
Implantable Lead Serial Number: 29296655
Implantable Pulse Generator Implant Date: 20131211
Lead Channel Pacing Threshold Pulse Width: 0.5 ms
Lead Channel Setting Pacing Pulse Width: 0.5 ms
Lead Channel Setting Sensing Sensitivity: 2.5 mV
MDC IDC LEAD IMPLANT DT: 20131211
MDC IDC LEAD IMPLANT DT: 20131211
MDC IDC LEAD LOCATION: 753859
MDC IDC LEAD SERIAL: 29317536
MDC IDC MSMT LEADCHNL RA IMPEDANCE VALUE: 695 Ohm
MDC IDC MSMT LEADCHNL RA PACING THRESHOLD AMPLITUDE: 0.9 V
MDC IDC MSMT LEADCHNL RV IMPEDANCE VALUE: 589 Ohm
MDC IDC PG SERIAL: 144309
MDC IDC SET LEADCHNL RV PACING AMPLITUDE: 2.4 V
MDC IDC STAT BRADY RA PERCENT PACED: 0 %

## 2017-12-25 ENCOUNTER — Encounter: Payer: Self-pay | Admitting: Physician Assistant

## 2017-12-25 ENCOUNTER — Ambulatory Visit (INDEPENDENT_AMBULATORY_CARE_PROVIDER_SITE_OTHER): Payer: Medicare Other | Admitting: Physician Assistant

## 2017-12-25 VITALS — BP 110/82 | HR 68 | Ht 62.0 in | Wt 137.0 lb

## 2017-12-25 DIAGNOSIS — I714 Abdominal aortic aneurysm, without rupture, unspecified: Secondary | ICD-10-CM

## 2017-12-25 DIAGNOSIS — I200 Unstable angina: Secondary | ICD-10-CM

## 2017-12-25 DIAGNOSIS — E785 Hyperlipidemia, unspecified: Secondary | ICD-10-CM

## 2017-12-25 DIAGNOSIS — Z950 Presence of cardiac pacemaker: Secondary | ICD-10-CM

## 2017-12-25 DIAGNOSIS — I7102 Dissection of abdominal aorta: Secondary | ICD-10-CM | POA: Diagnosis not present

## 2017-12-25 DIAGNOSIS — I481 Persistent atrial fibrillation: Secondary | ICD-10-CM

## 2017-12-25 DIAGNOSIS — I4819 Other persistent atrial fibrillation: Secondary | ICD-10-CM

## 2017-12-25 DIAGNOSIS — I2510 Atherosclerotic heart disease of native coronary artery without angina pectoris: Secondary | ICD-10-CM

## 2017-12-25 DIAGNOSIS — E782 Mixed hyperlipidemia: Secondary | ICD-10-CM | POA: Diagnosis not present

## 2017-12-25 DIAGNOSIS — I480 Paroxysmal atrial fibrillation: Secondary | ICD-10-CM | POA: Diagnosis not present

## 2017-12-25 DIAGNOSIS — R072 Precordial pain: Secondary | ICD-10-CM | POA: Diagnosis not present

## 2017-12-25 DIAGNOSIS — I10 Essential (primary) hypertension: Secondary | ICD-10-CM | POA: Diagnosis not present

## 2017-12-25 NOTE — Patient Instructions (Addendum)
[name], [name] recommends that you continue on your current medications as directed. Please refer to the Current Medication list given to you today.  Your physician has requested that you regularly monitor your blood pressure at home. Check your blood pressure twice daily. Please use the same machine to check your blood pressure daily. Keep a record of your blood pressures using the log sheet provided.   Please bring your blood pressure log and your blood pressure cuff to this appointment.  [name] recommends that you schedule a follow-up appointment in 6 weeks.  If you need a refill on your cardiac medications before your next appointment, please call your pharmacy.
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
## 2017-12-26 ENCOUNTER — Ambulatory Visit (INDEPENDENT_AMBULATORY_CARE_PROVIDER_SITE_OTHER): Payer: Medicare Other | Admitting: Vascular Surgery

## 2017-12-26 ENCOUNTER — Encounter: Payer: Self-pay | Admitting: Vascular Surgery

## 2017-12-26 ENCOUNTER — Other Ambulatory Visit: Payer: Self-pay

## 2017-12-26 VITALS — BP 144/88 | HR 70 | Resp 18 | Ht 62.0 in | Wt 138.6 lb

## 2017-12-26 DIAGNOSIS — I713 Abdominal aortic aneurysm, ruptured, unspecified: Secondary | ICD-10-CM
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
## 2017-12-27 ENCOUNTER — Encounter: Payer: Self-pay | Admitting: Physician Assistant

## 2017-12-27 NOTE — Progress Notes (Signed)
Thanks MCr 

## 2017-12-29 ENCOUNTER — Other Ambulatory Visit: Payer: Self-pay | Admitting: Cardiovascular Disease

## 2018-01-14 DIAGNOSIS — Z0000 Encounter for general adult medical examination without abnormal findings: Secondary | ICD-10-CM | POA: Diagnosis not present

## 2018-01-14 DIAGNOSIS — Z780 Asymptomatic menopausal state: Secondary | ICD-10-CM | POA: Diagnosis not present

## 2018-01-14 DIAGNOSIS — I10 Essential (primary) hypertension: Secondary | ICD-10-CM | POA: Diagnosis not present

## 2018-01-14 DIAGNOSIS — Z23 Encounter for immunization: Secondary | ICD-10-CM | POA: Diagnosis not present

## 2018-01-14 DIAGNOSIS — I480 Paroxysmal atrial fibrillation: Secondary | ICD-10-CM | POA: Diagnosis not present

## 2018-01-14 DIAGNOSIS — E782 Mixed hyperlipidemia: Secondary | ICD-10-CM | POA: Diagnosis not present

## 2018-01-14 DIAGNOSIS — M810 Age-related osteoporosis without current pathological fracture: Secondary | ICD-10-CM | POA: Diagnosis not present

## 2018-01-14 DIAGNOSIS — Z79899 Other long term (current) drug therapy: Secondary | ICD-10-CM | POA: Diagnosis not present

## 2018-01-21 DIAGNOSIS — I700 Atherosclerosis of aorta: Secondary | ICD-10-CM | POA: Diagnosis not present

## 2018-01-21 DIAGNOSIS — J449 Chronic obstructive pulmonary disease, unspecified: Secondary | ICD-10-CM | POA: Diagnosis not present

## 2018-01-21 DIAGNOSIS — N183 Chronic kidney disease, stage 3 (moderate): Secondary | ICD-10-CM | POA: Diagnosis not present

## 2018-01-21 DIAGNOSIS — I480 Paroxysmal atrial fibrillation: Secondary | ICD-10-CM | POA: Diagnosis not present

## 2018-01-21 DIAGNOSIS — E782 Mixed hyperlipidemia: Secondary | ICD-10-CM | POA: Diagnosis not present

## 2018-01-21 DIAGNOSIS — I129 Hypertensive chronic kidney disease with stage 1 through stage 4 chronic kidney disease, or unspecified chronic kidney disease: Secondary | ICD-10-CM | POA: Diagnosis not present

## 2018-01-21 DIAGNOSIS — I7102 Dissection of abdominal aorta: Secondary | ICD-10-CM | POA: Diagnosis not present

## 2018-01-21 DIAGNOSIS — I10 Essential (primary) hypertension: Secondary | ICD-10-CM | POA: Diagnosis not present

## 2018-01-21 DIAGNOSIS — I714 Abdominal aortic aneurysm, without rupture: Secondary | ICD-10-CM | POA: Diagnosis not present

## 2018-01-21 DIAGNOSIS — I712 Thoracic aortic aneurysm, without rupture: Secondary | ICD-10-CM | POA: Diagnosis not present

## 2018-01-28 ENCOUNTER — Other Ambulatory Visit: Payer: Self-pay | Admitting: Cardiovascular Disease

## 2018-02-05 DIAGNOSIS — M810 Age-related osteoporosis without current pathological fracture: Secondary | ICD-10-CM | POA: Diagnosis not present

## 2018-02-10 ENCOUNTER — Ambulatory Visit (INDEPENDENT_AMBULATORY_CARE_PROVIDER_SITE_OTHER): Payer: Medicare Other | Admitting: Physician Assistant

## 2018-02-10 ENCOUNTER — Encounter: Payer: Self-pay | Admitting: Physician Assistant

## 2018-02-10 VITALS — BP 150/82 | HR 83 | Ht 61.5 in | Wt 138.0 lb

## 2018-02-10 DIAGNOSIS — I2510 Atherosclerotic heart disease of native coronary artery without angina pectoris: Secondary | ICD-10-CM | POA: Diagnosis not present

## 2018-02-10 DIAGNOSIS — Z950 Presence of cardiac pacemaker: Secondary | ICD-10-CM | POA: Diagnosis not present

## 2018-02-10 DIAGNOSIS — E785 Hyperlipidemia, unspecified: Secondary | ICD-10-CM

## 2018-02-10 DIAGNOSIS — Z8679 Personal history of other diseases of the circulatory system: Secondary | ICD-10-CM | POA: Diagnosis not present

## 2018-02-10 DIAGNOSIS — I480 Paroxysmal atrial fibrillation: Secondary | ICD-10-CM | POA: Diagnosis not present

## 2018-02-10 DIAGNOSIS — J449 Chronic obstructive pulmonary disease, unspecified: Secondary | ICD-10-CM

## 2018-02-10 DIAGNOSIS — Z9889 Other specified postprocedural states: Secondary | ICD-10-CM

## 2018-02-10 DIAGNOSIS — I200 Unstable angina: Secondary | ICD-10-CM | POA: Diagnosis not present

## 2018-02-10 MED ORDER — METOPROLOL SUCCINATE ER 50 MG PO TB24: 50.0000 mg | ORAL_TABLET | Freq: Two times a day (BID) | ORAL | 6 refills | Status: DC
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
## 2018-02-10 NOTE — Patient Instructions (Signed)
Medication Instructions:  CHANGE Metoprolol to 50mg  Take 1 tablet twice a day  If you need a refill on your cardiac medications before your next appointment, please call your pharmacy.  Labwork: None   Testing/Procedures: None   Follow-Up: Your physician recommends that you schedule a follow-up appointment in: 2 months with Dr Sallyanne Kuster.  Any Other Special Instructions Will Be Listed Below (If Applicable).

## 2018-02-12 ENCOUNTER — Encounter: Payer: Self-pay | Admitting: Physician Assistant

## 2018-02-18 ENCOUNTER — Ambulatory Visit (INDEPENDENT_AMBULATORY_CARE_PROVIDER_SITE_OTHER): Payer: Medicare Other | Admitting: *Deleted

## 2018-02-18 DIAGNOSIS — I442 Atrioventricular block, complete: Secondary | ICD-10-CM

## 2018-02-19 NOTE — Progress Notes (Signed)
Remote pacemaker transmission.   

## 2018-03-10 DIAGNOSIS — H353122 Nonexudative age-related macular degeneration, left eye, intermediate dry stage: Secondary | ICD-10-CM | POA: Diagnosis not present

## 2018-03-10 DIAGNOSIS — H31001 Unspecified chorioretinal scars, right eye: Secondary | ICD-10-CM | POA: Diagnosis not present

## 2018-03-10 LAB — CUP PACEART REMOTE DEVICE CHECK
Date Time Interrogation Session: 20191029080719
Implantable Lead Implant Date: 20131211
Implantable Lead Location: 753860
Implantable Lead Model: 4135
Implantable Lead Model: 4136
Implantable Lead Serial Number: 29296655
Implantable Lead Serial Number: 29317536
MDC IDC LEAD IMPLANT DT: 20131211
MDC IDC LEAD LOCATION: 753859
MDC IDC PG IMPLANT DT: 20131211
MDC IDC PG SERIAL: 144309

## 2018-04-23 ENCOUNTER — Ambulatory Visit (INDEPENDENT_AMBULATORY_CARE_PROVIDER_SITE_OTHER): Payer: Medicare Other | Admitting: Cardiovascular Disease

## 2018-04-23 VITALS — BP 108/68 | HR 85 | Ht 61.5 in | Wt 139.0 lb

## 2018-04-23 DIAGNOSIS — I442 Atrioventricular block, complete: Secondary | ICD-10-CM

## 2018-04-23 DIAGNOSIS — I350 Nonrheumatic aortic (valve) stenosis: Secondary | ICD-10-CM | POA: Diagnosis not present

## 2018-04-23 DIAGNOSIS — Z950 Presence of cardiac pacemaker: Secondary | ICD-10-CM

## 2018-04-23 DIAGNOSIS — I714 Abdominal aortic aneurysm, without rupture, unspecified: Secondary | ICD-10-CM

## 2018-04-23 DIAGNOSIS — I712 Thoracic aortic aneurysm, without rupture: Secondary | ICD-10-CM | POA: Diagnosis not present

## 2018-04-23 DIAGNOSIS — I351 Nonrheumatic aortic (valve) insufficiency: Secondary | ICD-10-CM

## 2018-04-23 DIAGNOSIS — E7800 Pure hypercholesterolemia, unspecified: Secondary | ICD-10-CM

## 2018-04-23 DIAGNOSIS — I5032 Chronic diastolic (congestive) heart failure: Secondary | ICD-10-CM | POA: Diagnosis not present

## 2018-04-23 DIAGNOSIS — I2510 Atherosclerotic heart disease of native coronary artery without angina pectoris: Secondary | ICD-10-CM

## 2018-04-23 DIAGNOSIS — Z7901 Long term (current) use of anticoagulants: Secondary | ICD-10-CM | POA: Diagnosis not present

## 2018-04-23 DIAGNOSIS — I4811 Longstanding persistent atrial fibrillation: Secondary | ICD-10-CM

## 2018-04-23 DIAGNOSIS — I150 Renovascular hypertension: Secondary | ICD-10-CM | POA: Diagnosis not present

## 2018-04-23 DIAGNOSIS — I7101 Dissection of thoracic aorta: Secondary | ICD-10-CM

## 2018-04-23 DIAGNOSIS — I7121 Aneurysm of the ascending aorta, without rupture: Secondary | ICD-10-CM

## 2018-04-23 DIAGNOSIS — E875 Hyperkalemia: Secondary | ICD-10-CM

## 2018-04-23 DIAGNOSIS — I200 Unstable angina: Secondary | ICD-10-CM

## 2018-04-23 DIAGNOSIS — I71012 Dissection of descending thoracic aorta: Secondary | ICD-10-CM
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
## 2018-04-23 NOTE — Patient Instructions (Signed)
Medication Instructions:  Dr Sallyanne Kuster recommends that you continue on your current medications as directed. Please refer to the Current Medication list given to you today.  If you need a refill on your cardiac medications before your next appointment, please call your pharmacy.   Testing/Procedures: Your physician has requested that you have an echocardiogram. Echocardiography is a painless test that uses sound waves to create images of your heart. It provides your doctor with information about the size and shape of your heart and how well your heart's chambers and valves are working. This procedure takes approximately one hour. There are no restrictions for this procedure.  Follow-Up: At [facility], you and your health needs are our priority.  As part of our continuing mission to provide you with exceptional heart care, we have created designated Provider Care Teams.  These Care Teams include your primary Cardiologist (physician) and Advanced Practice Providers (APPs -  Physician Assistants and Nurse Practitioners) who all work together to provide you with the care you need, when you need it. You will need a follow up appointment in 12 months.  You may see Sanda Klein, MD or one of the following Advanced Practice Providers on your designated Care Team: [place], [place] . Fabian Sharp, PA-C . You will receive a reminder letter in the mail two months in advance. If you don't receive a letter, please call our office to schedule the follow-up appointment.

## 2018-04-24 ENCOUNTER — Encounter: Payer: Self-pay | Admitting: Cardiovascular Disease

## 2018-04-24 DIAGNOSIS — I7101 Dissection of thoracic aorta: Secondary | ICD-10-CM | POA: Insufficient documentation

## 2018-04-24 DIAGNOSIS — E875 Hyperkalemia: Secondary | ICD-10-CM | POA: Insufficient documentation

## 2018-04-24 DIAGNOSIS — I150 Renovascular hypertension: Secondary | ICD-10-CM | POA: Insufficient documentation

## 2018-04-24 DIAGNOSIS — I71012 Dissection of descending thoracic aorta: Secondary | ICD-10-CM | POA: Insufficient documentation

## 2018-04-24 DIAGNOSIS — I712 Thoracic aortic aneurysm, without rupture: Secondary | ICD-10-CM | POA: Insufficient documentation

## 2018-04-24 DIAGNOSIS — I351 Nonrheumatic aortic (valve) insufficiency: Secondary | ICD-10-CM | POA: Insufficient documentation

## 2018-04-24 DIAGNOSIS — I5032 Chronic diastolic (congestive) heart failure: Secondary | ICD-10-CM | POA: Insufficient documentation

## 2018-04-24 DIAGNOSIS — I7121 Aneurysm of the ascending aorta, without rupture: Secondary | ICD-10-CM | POA: Insufficient documentation

## 2018-05-15 ENCOUNTER — Other Ambulatory Visit: Payer: Self-pay | Admitting: *Deleted

## 2018-05-15 DIAGNOSIS — I7121 Aneurysm of the ascending aorta, without rupture: Secondary | ICD-10-CM

## 2018-05-15 DIAGNOSIS — I712 Thoracic aortic aneurysm, without rupture: Secondary | ICD-10-CM

## 2018-05-19 ENCOUNTER — Ambulatory Visit (HOSPITAL_COMMUNITY): Payer: Medicare Other | Attending: Cardiology

## 2018-05-19 ENCOUNTER — Other Ambulatory Visit: Payer: Self-pay

## 2018-05-19 DIAGNOSIS — I351 Nonrheumatic aortic (valve) insufficiency: Secondary | ICD-10-CM | POA: Diagnosis not present

## 2018-05-20 ENCOUNTER — Ambulatory Visit (INDEPENDENT_AMBULATORY_CARE_PROVIDER_SITE_OTHER): Payer: Medicare Other

## 2018-05-20 DIAGNOSIS — I442 Atrioventricular block, complete: Secondary | ICD-10-CM | POA: Diagnosis not present

## 2018-05-21 LAB — CUP PACEART REMOTE DEVICE CHECK
Date Time Interrogation Session: 20200109194950
Implantable Lead Implant Date: 20131211
Implantable Lead Location: 753860
Implantable Lead Model: 4135
Implantable Lead Model: 4136
Implantable Lead Serial Number: 29296655
MDC IDC LEAD IMPLANT DT: 20131211
MDC IDC LEAD LOCATION: 753859
MDC IDC LEAD SERIAL: 29317536
MDC IDC PG IMPLANT DT: 20131211
Pulse Gen Serial Number: 144309

## 2018-05-21 NOTE — Progress Notes (Signed)
Remote pacemaker transmission.   

## 2018-06-02 LAB — CUP PACEART INCLINIC DEVICE CHECK
Date Time Interrogation Session: 20200121140742
Implantable Lead Implant Date: 20131211
Implantable Lead Location: 753860
Implantable Lead Model: 4135
Implantable Lead Model: 4136
Implantable Lead Serial Number: 29296655
MDC IDC LEAD IMPLANT DT: 20131211
MDC IDC LEAD LOCATION: 753859
MDC IDC LEAD SERIAL: 29317536
MDC IDC PG IMPLANT DT: 20131211
Pulse Gen Serial Number: 144309

## 2018-06-30 ENCOUNTER — Other Ambulatory Visit: Payer: Self-pay | Admitting: Cardiovascular Disease

## 2018-07-01 NOTE — Telephone Encounter (Signed)
Rx(s) sent to pharmacy electronically.  

## 2018-07-03 ENCOUNTER — Other Ambulatory Visit (HOSPITAL_COMMUNITY): Payer: Medicare Other

## 2018-07-03 ENCOUNTER — Ambulatory Visit: Payer: Medicare Other | Admitting: Vascular Surgery

## 2018-07-29 DIAGNOSIS — N183 Chronic kidney disease, stage 3 (moderate): Secondary | ICD-10-CM | POA: Diagnosis not present

## 2018-07-29 DIAGNOSIS — I712 Thoracic aortic aneurysm, without rupture: Secondary | ICD-10-CM | POA: Diagnosis not present

## 2018-07-29 DIAGNOSIS — I129 Hypertensive chronic kidney disease with stage 1 through stage 4 chronic kidney disease, or unspecified chronic kidney disease: Secondary | ICD-10-CM | POA: Diagnosis not present

## 2018-07-29 DIAGNOSIS — E782 Mixed hyperlipidemia: Secondary | ICD-10-CM | POA: Diagnosis not present

## 2018-07-30 ENCOUNTER — Ambulatory Visit (HOSPITAL_COMMUNITY)
Admission: RE | Admit: 2018-07-30 | Discharge: 2018-07-30 | Disposition: A | Discharge status: Home / Self Care | Payer: Medicare Other | Source: Ambulatory Visit | Attending: Family | Admitting: Family

## 2018-07-30 ENCOUNTER — Other Ambulatory Visit: Payer: Self-pay

## 2018-07-30 DIAGNOSIS — I712 Thoracic aortic aneurysm, without rupture: Secondary | ICD-10-CM

## 2018-07-30 DIAGNOSIS — I7121 Aneurysm of the ascending aorta, without rupture: Secondary | ICD-10-CM

## 2018-08-03 ENCOUNTER — Other Ambulatory Visit: Payer: Self-pay | Admitting: Cardiovascular Disease

## 2018-08-04 ENCOUNTER — Other Ambulatory Visit (HOSPITAL_COMMUNITY): Payer: Medicare Other

## 2018-08-04 ENCOUNTER — Ambulatory Visit: Payer: Medicare Other | Admitting: Family

## 2018-08-05 DIAGNOSIS — I129 Hypertensive chronic kidney disease with stage 1 through stage 4 chronic kidney disease, or unspecified chronic kidney disease: Secondary | ICD-10-CM | POA: Diagnosis not present

## 2018-08-05 DIAGNOSIS — I712 Thoracic aortic aneurysm, without rupture: Secondary | ICD-10-CM | POA: Diagnosis not present

## 2018-08-05 DIAGNOSIS — N183 Chronic kidney disease, stage 3 (moderate): Secondary | ICD-10-CM | POA: Diagnosis not present

## 2018-08-05 DIAGNOSIS — I10 Essential (primary) hypertension: Secondary | ICD-10-CM | POA: Diagnosis not present

## 2018-08-05 DIAGNOSIS — R072 Precordial pain: Secondary | ICD-10-CM | POA: Diagnosis not present

## 2018-08-05 DIAGNOSIS — E782 Mixed hyperlipidemia: Secondary | ICD-10-CM | POA: Diagnosis not present

## 2018-08-07 ENCOUNTER — Ambulatory Visit: Payer: Medicare Other | Admitting: Family

## 2018-08-07 ENCOUNTER — Other Ambulatory Visit (HOSPITAL_COMMUNITY): Payer: Medicare Other

## 2018-08-17 ENCOUNTER — Other Ambulatory Visit: Payer: Self-pay

## 2018-08-17 DIAGNOSIS — I713 Abdominal aortic aneurysm, ruptured, unspecified: Secondary | ICD-10-CM

## 2018-08-17 DIAGNOSIS — I712 Thoracic aortic aneurysm, without rupture: Secondary | ICD-10-CM

## 2018-08-17 DIAGNOSIS — I7121 Aneurysm of the ascending aorta, without rupture: Secondary | ICD-10-CM

## 2018-08-19 ENCOUNTER — Ambulatory Visit (INDEPENDENT_AMBULATORY_CARE_PROVIDER_SITE_OTHER): Payer: Medicare Other | Admitting: *Deleted

## 2018-08-19 ENCOUNTER — Other Ambulatory Visit: Payer: Self-pay

## 2018-08-19 DIAGNOSIS — I442 Atrioventricular block, complete: Secondary | ICD-10-CM

## 2018-08-19 DIAGNOSIS — M810 Age-related osteoporosis without current pathological fracture: Secondary | ICD-10-CM | POA: Diagnosis not present

## 2018-08-19 LAB — CUP PACEART REMOTE DEVICE CHECK
Battery Remaining Longevity: 36 mo
Battery Remaining Percentage: 57 %
Brady Statistic RA Percent Paced: 0 %
Brady Statistic RV Percent Paced: 99 %
Date Time Interrogation Session: 20200408053100
Implantable Lead Implant Date: 20131211
Implantable Lead Implant Date: 20131211
Implantable Lead Location: 753859
Implantable Lead Location: 753860
Implantable Lead Model: 4135
Implantable Lead Model: 4136
Implantable Lead Serial Number: 29296655
Implantable Lead Serial Number: 29317536
Implantable Pulse Generator Implant Date: 20131211
Lead Channel Impedance Value: 589 Ohm
Lead Channel Impedance Value: 638 Ohm
Lead Channel Pacing Threshold Amplitude: 0.9 V
Lead Channel Pacing Threshold Pulse Width: 0.5 ms
Lead Channel Setting Pacing Amplitude: 2.4 V
Lead Channel Setting Pacing Pulse Width: 0.5 ms
Lead Channel Setting Sensing Sensitivity: 2.5 mV
Pulse Gen Serial Number: 144309

## 2018-08-25 ENCOUNTER — Ambulatory Visit
Admission: RE | Admit: 2018-08-25 | Discharge: 2018-08-25 | Disposition: A | Discharge status: Home / Self Care | Payer: Medicare Other | Source: Ambulatory Visit | Attending: Vascular Surgery | Admitting: Vascular Surgery

## 2018-08-25 ENCOUNTER — Other Ambulatory Visit: Payer: Medicare Other

## 2018-08-25 ENCOUNTER — Other Ambulatory Visit: Payer: Self-pay

## 2018-08-25 DIAGNOSIS — I713 Abdominal aortic aneurysm, ruptured, unspecified: Secondary | ICD-10-CM

## 2018-08-25 MED ORDER — IOPAMIDOL (ISOVUE-370) INJECTION 76%
75.0000 mL | Freq: Once | INTRAVENOUS | Status: AC | PRN
  Administered 2018-08-25: 75 mL via INTRAVENOUS

## 2018-08-27 ENCOUNTER — Telehealth (HOSPITAL_COMMUNITY): Payer: Self-pay | Admitting: Rehabilitation

## 2018-08-27 NOTE — Telephone Encounter (Signed)
The above patient or their representative was contacted and gave the following answers to these questions:         Do you have any of the following symptoms? "Nothing aside from the ordinary"  Fever                    Cough                   Shortness of breath  Do  you have any of the following other symptoms? No   muscle pain         vomiting,        diarrhea        rash         weakness        red eye        abdominal pain         bruising          bruising or bleeding              joint pain           severe headache    Have you been in contact with someone who was or has been sick in the past 2 weeks? No  Yes                 Unsure                         Unable to assess   Does the person that you were in contact with have any of the following symptoms?   Cough         shortness of breath           muscle pain         vomiting,            diarrhea            rash            weakness           fever            red eye           abdominal pain           bruising  or  bleeding                joint pain                severe headache               Have you  or someone you have been in contact with traveled internationally in th last month? No        If yes, which countries?   Have you  or someone you have been in contact with traveled outside [place] in th last month? No         If yes, which state and city?   COMMENTS OR ACTION PLAN FOR THIS PATIENT:

## 2018-08-28 ENCOUNTER — Other Ambulatory Visit: Payer: Self-pay

## 2018-08-28 ENCOUNTER — Encounter: Payer: Self-pay | Admitting: Vascular Surgery

## 2018-08-28 ENCOUNTER — Encounter: Payer: Self-pay | Admitting: Cardiology

## 2018-08-28 ENCOUNTER — Ambulatory Visit (INDEPENDENT_AMBULATORY_CARE_PROVIDER_SITE_OTHER): Payer: Medicare Other | Admitting: Vascular Surgery

## 2018-08-28 VITALS — BP 144/88 | HR 80 | Temp 97.6°F | Resp 20 | Ht 61.0 in | Wt 136.3 lb

## 2018-08-28 DIAGNOSIS — I713 Abdominal aortic aneurysm, ruptured, unspecified: Secondary | ICD-10-CM

## 2018-08-28 NOTE — Progress Notes (Signed)
Remote pacemaker transmission.   
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
## 2018-09-22 ENCOUNTER — Other Ambulatory Visit: Payer: Self-pay

## 2018-09-22 DIAGNOSIS — I480 Paroxysmal atrial fibrillation: Secondary | ICD-10-CM

## 2018-09-22 DIAGNOSIS — Z950 Presence of cardiac pacemaker: Secondary | ICD-10-CM

## 2018-09-22 MED ORDER — METOPROLOL SUCCINATE ER 50 MG PO TB24: 50.0000 mg | ORAL_TABLET | Freq: Two times a day (BID) | ORAL | 6 refills | Status: DC

## 2018-11-16 DIAGNOSIS — R1013 Epigastric pain: Secondary | ICD-10-CM | POA: Diagnosis not present

## 2018-11-16 DIAGNOSIS — R5383 Other fatigue: Secondary | ICD-10-CM | POA: Diagnosis not present

## 2018-11-16 DIAGNOSIS — R1012 Left upper quadrant pain: Secondary | ICD-10-CM | POA: Diagnosis not present

## 2018-11-16 DIAGNOSIS — E782 Mixed hyperlipidemia: Secondary | ICD-10-CM | POA: Diagnosis not present

## 2018-11-16 DIAGNOSIS — N183 Chronic kidney disease, stage 3 (moderate): Secondary | ICD-10-CM | POA: Diagnosis not present

## 2018-11-16 DIAGNOSIS — Z7189 Other specified counseling: Secondary | ICD-10-CM | POA: Diagnosis not present

## 2018-11-16 DIAGNOSIS — I10 Essential (primary) hypertension: Secondary | ICD-10-CM | POA: Diagnosis not present

## 2018-11-18 ENCOUNTER — Ambulatory Visit (INDEPENDENT_AMBULATORY_CARE_PROVIDER_SITE_OTHER): Payer: Medicare Other | Admitting: *Deleted

## 2018-11-18 DIAGNOSIS — I442 Atrioventricular block, complete: Secondary | ICD-10-CM | POA: Diagnosis not present

## 2018-11-18 LAB — CUP PACEART REMOTE DEVICE CHECK
Battery Remaining Longevity: 30 mo
Battery Remaining Percentage: 52 %
Brady Statistic RA Percent Paced: 0 %
Brady Statistic RV Percent Paced: 99 %
Date Time Interrogation Session: 20200708053100
Implantable Lead Implant Date: 20131211
Implantable Lead Implant Date: 20131211
Implantable Lead Location: 753859
Implantable Lead Location: 753860
Implantable Lead Model: 4135
Implantable Lead Model: 4136
Implantable Lead Serial Number: 29296655
Implantable Lead Serial Number: 29317536
Implantable Pulse Generator Implant Date: 20131211
Lead Channel Impedance Value: 575 Ohm
Lead Channel Impedance Value: 637 Ohm
Lead Channel Pacing Threshold Amplitude: 0.9 V
Lead Channel Pacing Threshold Pulse Width: 0.5 ms
Lead Channel Setting Pacing Amplitude: 2.4 V
Lead Channel Setting Pacing Pulse Width: 0.5 ms
Lead Channel Setting Sensing Sensitivity: 2.5 mV
Pulse Gen Serial Number: 144309

## 2018-11-30 ENCOUNTER — Encounter: Payer: Self-pay | Admitting: Cardiology

## 2018-11-30 DIAGNOSIS — N183 Chronic kidney disease, stage 3 (moderate): Secondary | ICD-10-CM | POA: Diagnosis not present

## 2018-11-30 DIAGNOSIS — I10 Essential (primary) hypertension: Secondary | ICD-10-CM | POA: Diagnosis not present

## 2018-11-30 DIAGNOSIS — R1013 Epigastric pain: Secondary | ICD-10-CM | POA: Diagnosis not present

## 2018-11-30 NOTE — Progress Notes (Signed)
Remote pacemaker transmission.   

## 2018-12-02 DIAGNOSIS — N183 Chronic kidney disease, stage 3 (moderate): Secondary | ICD-10-CM | POA: Diagnosis not present

## 2018-12-02 DIAGNOSIS — R5383 Other fatigue: Secondary | ICD-10-CM | POA: Diagnosis not present

## 2018-12-02 DIAGNOSIS — I10 Essential (primary) hypertension: Secondary | ICD-10-CM | POA: Diagnosis not present

## 2018-12-02 DIAGNOSIS — E782 Mixed hyperlipidemia: Secondary | ICD-10-CM | POA: Diagnosis not present

## 2018-12-02 DIAGNOSIS — Z7189 Other specified counseling: Secondary | ICD-10-CM | POA: Diagnosis not present

## 2019-01-11 ENCOUNTER — Emergency Department (HOSPITAL_COMMUNITY)
Admission: EM | Admit: 2019-01-11 | Discharge: 2019-01-12 | Disposition: A | Discharge status: Home / Self Care | Payer: Medicare Other | Attending: Emergency Medicine | Admitting: Emergency Medicine

## 2019-01-11 ENCOUNTER — Emergency Department (HOSPITAL_COMMUNITY): Payer: Medicare Other

## 2019-01-11 ENCOUNTER — Other Ambulatory Visit: Payer: Self-pay

## 2019-01-11 ENCOUNTER — Encounter (HOSPITAL_COMMUNITY): Payer: Self-pay | Admitting: Pharmacy Technician

## 2019-01-11 DIAGNOSIS — K5792 Diverticulitis of intestine, part unspecified, without perforation or abscess without bleeding: Secondary | ICD-10-CM | POA: Diagnosis not present

## 2019-01-11 DIAGNOSIS — J449 Chronic obstructive pulmonary disease, unspecified: Secondary | ICD-10-CM | POA: Diagnosis not present

## 2019-01-11 DIAGNOSIS — I110 Hypertensive heart disease with heart failure: Secondary | ICD-10-CM | POA: Insufficient documentation

## 2019-01-11 DIAGNOSIS — I712 Thoracic aortic aneurysm, without rupture: Secondary | ICD-10-CM | POA: Diagnosis not present

## 2019-01-11 DIAGNOSIS — Z85840 Personal history of malignant neoplasm of eye: Secondary | ICD-10-CM | POA: Insufficient documentation

## 2019-01-11 DIAGNOSIS — I5032 Chronic diastolic (congestive) heart failure: Secondary | ICD-10-CM | POA: Insufficient documentation

## 2019-01-11 DIAGNOSIS — Z96659 Presence of unspecified artificial knee joint: Secondary | ICD-10-CM | POA: Insufficient documentation

## 2019-01-11 DIAGNOSIS — Z79899 Other long term (current) drug therapy: Secondary | ICD-10-CM | POA: Insufficient documentation

## 2019-01-11 DIAGNOSIS — R1013 Epigastric pain: Secondary | ICD-10-CM

## 2019-01-11 DIAGNOSIS — Z87891 Personal history of nicotine dependence: Secondary | ICD-10-CM | POA: Insufficient documentation

## 2019-01-11 DIAGNOSIS — I4510 Unspecified right bundle-branch block: Secondary | ICD-10-CM | POA: Diagnosis not present

## 2019-01-11 DIAGNOSIS — Z7982 Long term (current) use of aspirin: Secondary | ICD-10-CM | POA: Insufficient documentation

## 2019-01-11 DIAGNOSIS — K529 Noninfective gastroenteritis and colitis, unspecified: Secondary | ICD-10-CM | POA: Diagnosis not present

## 2019-01-11 DIAGNOSIS — K5732 Diverticulitis of large intestine without perforation or abscess without bleeding: Secondary | ICD-10-CM | POA: Diagnosis not present

## 2019-01-11 DIAGNOSIS — R110 Nausea: Secondary | ICD-10-CM | POA: Diagnosis not present

## 2019-01-11 DIAGNOSIS — Z950 Presence of cardiac pacemaker: Secondary | ICD-10-CM | POA: Insufficient documentation

## 2019-01-11 DIAGNOSIS — R1084 Generalized abdominal pain: Secondary | ICD-10-CM | POA: Diagnosis not present

## 2019-01-11 DIAGNOSIS — I713 Abdominal aortic aneurysm, ruptured: Secondary | ICD-10-CM | POA: Diagnosis not present

## 2019-01-11 DIAGNOSIS — R52 Pain, unspecified: Secondary | ICD-10-CM | POA: Diagnosis not present

## 2019-01-11 LAB — CBC WITH DIFFERENTIAL/PLATELET
Abs Immature Granulocytes: 0.02 10*3/uL (ref 0.00–0.07)
Basophils Absolute: 0.1 10*3/uL (ref 0.0–0.1)
Basophils Relative: 1 %
Eosinophils Absolute: 0.1 10*3/uL (ref 0.0–0.5)
Eosinophils Relative: 1 %
HCT: 47 % — ABNORMAL HIGH (ref 36.0–46.0)
Hemoglobin: 14.8 g/dL (ref 12.0–15.0)
Immature Granulocytes: 0 %
Lymphocytes Relative: 16 %
Lymphs Abs: 1.5 10*3/uL (ref 0.7–4.0)
MCH: 32 pg (ref 26.0–34.0)
MCHC: 31.5 g/dL (ref 30.0–36.0)
MCV: 101.7 fL — ABNORMAL HIGH (ref 80.0–100.0)
Monocytes Absolute: 0.7 10*3/uL (ref 0.1–1.0)
Monocytes Relative: 7 %
Neutro Abs: 6.9 10*3/uL (ref 1.7–7.7)
Neutrophils Relative %: 75 %
Platelets: 120 10*3/uL — ABNORMAL LOW (ref 150–400)
RBC: 4.62 MIL/uL (ref 3.87–5.11)
RDW: 13.1 % (ref 11.5–15.5)
WBC: 9.2 10*3/uL (ref 4.0–10.5)
nRBC: 0 % (ref 0.0–0.2)

## 2019-01-11 LAB — URINALYSIS, ROUTINE W REFLEX MICROSCOPIC
Bilirubin Urine: NEGATIVE
Glucose, UA: NEGATIVE mg/dL
Hgb urine dipstick: NEGATIVE
Ketones, ur: NEGATIVE mg/dL
Leukocytes,Ua: NEGATIVE
Nitrite: NEGATIVE
Protein, ur: NEGATIVE mg/dL
Specific Gravity, Urine: 1.018 (ref 1.005–1.030)
pH: 5 (ref 5.0–8.0)

## 2019-01-11 LAB — COMPREHENSIVE METABOLIC PANEL
ALT: 59 U/L — ABNORMAL HIGH (ref 0–44)
AST: 198 U/L — ABNORMAL HIGH (ref 15–41)
Albumin: 3.6 g/dL (ref 3.5–5.0)
Alkaline Phosphatase: 159 U/L — ABNORMAL HIGH (ref 38–126)
Anion gap: 12 (ref 5–15)
BUN: 20 mg/dL (ref 8–23)
CO2: 19 mmol/L — ABNORMAL LOW (ref 22–32)
Calcium: 8.9 mg/dL (ref 8.9–10.3)
Chloride: 106 mmol/L (ref 98–111)
Creatinine, Ser: 0.97 mg/dL (ref 0.44–1.00)
GFR calc Af Amer: 59 mL/min — ABNORMAL LOW (ref >60)
GFR calc non Af Amer: 51 mL/min — ABNORMAL LOW (ref >60)
Glucose, Bld: 128 mg/dL — ABNORMAL HIGH (ref 70–99)
Potassium: 5.2 mmol/L — ABNORMAL HIGH (ref 3.5–5.1)
Sodium: 137 mmol/L (ref 135–145)
Total Bilirubin: 1.8 mg/dL — ABNORMAL HIGH (ref 0.3–1.2)
Total Protein: 5.8 g/dL — ABNORMAL LOW (ref 6.5–8.1)

## 2019-01-11 LAB — LIPASE, BLOOD: Lipase: 41 U/L (ref 11–51)

## 2019-01-11 MED ORDER — IOHEXOL 350 MG/ML SOLN
100.0000 mL | Freq: Once | INTRAVENOUS | Status: AC | PRN
  Administered 2019-01-11: 100 mL via INTRAVENOUS

## 2019-01-11 NOTE — ED Notes (Signed)
Per Joey with [employer]   Report: pt has pacemaker programed [id number]. Pt is paced 100% of the time. No episodes recorded in the past 24 hours. Pacemaker with 2 1/2 years left on the battery.
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
## 2019-01-11 NOTE — ED Notes (Signed)
Patient transported to CT 

## 2019-01-11 NOTE — ED Triage Notes (Signed)
Pt bib ems from home with epigastric pain onset earlier today. Pt took tums earlier with some relief. Pain came back this evening. Radiates into her chest and back on occasion. Has hx AAA. 154/96, HR 70 paced rhythm, 98% RA.

## 2019-01-11 NOTE — ED Notes (Signed)
Patient walked to bathroom for urine specimen. Pt gait steady with assist from tech. Pt became short of breath upon reaching bathroom and walking back. Oxygen 98% upon return from bathroom.

## 2019-01-11 NOTE — ED Notes (Signed)
Unable to obtain IV access. STAT IV team order placed. Attempted to call IV team [phone], no response.

## 2019-01-11 NOTE — ED Notes (Signed)
IV team at bedside 

## 2019-01-12 MED ORDER — CIPROFLOXACIN HCL 500 MG PO TABS: 500.0000 mg | ORAL_TABLET | Freq: Two times a day (BID) | ORAL | 0 refills | Status: AC

## 2019-01-12 MED ORDER — METRONIDAZOLE 500 MG PO TABS: 500.0000 mg | ORAL_TABLET | Freq: Three times a day (TID) | ORAL | 0 refills | Status: AC

## 2019-01-12 NOTE — ED Notes (Signed)
Discharge instructions discussed with pt. Pt verbalized understanding and no questions at this time. Pt to go home with family

## 2019-01-26 ENCOUNTER — Other Ambulatory Visit: Payer: Self-pay | Admitting: Vascular Surgery

## 2019-01-26 DIAGNOSIS — I7121 Aneurysm of the ascending aorta, without rupture: Secondary | ICD-10-CM

## 2019-01-26 DIAGNOSIS — I713 Abdominal aortic aneurysm, ruptured, unspecified: Secondary | ICD-10-CM

## 2019-01-26 DIAGNOSIS — I712 Thoracic aortic aneurysm, without rupture: Secondary | ICD-10-CM

## 2019-01-27 ENCOUNTER — Other Ambulatory Visit: Payer: Self-pay | Admitting: Vascular Surgery

## 2019-01-27 DIAGNOSIS — I7121 Aneurysm of the ascending aorta, without rupture: Secondary | ICD-10-CM

## 2019-01-27 DIAGNOSIS — I713 Abdominal aortic aneurysm, ruptured, unspecified: Secondary | ICD-10-CM

## 2019-01-27 DIAGNOSIS — I712 Thoracic aortic aneurysm, without rupture: Secondary | ICD-10-CM

## 2019-01-28 DIAGNOSIS — L218 Other seborrheic dermatitis: Secondary | ICD-10-CM | POA: Diagnosis not present

## 2019-01-28 DIAGNOSIS — Z85828 Personal history of other malignant neoplasm of skin: Secondary | ICD-10-CM | POA: Diagnosis not present

## 2019-01-28 DIAGNOSIS — L570 Actinic keratosis: Secondary | ICD-10-CM | POA: Diagnosis not present

## 2019-01-29 DIAGNOSIS — Z23 Encounter for immunization: Secondary | ICD-10-CM | POA: Diagnosis not present

## 2019-01-29 DIAGNOSIS — Z7189 Other specified counseling: Secondary | ICD-10-CM | POA: Diagnosis not present

## 2019-01-29 DIAGNOSIS — Z0000 Encounter for general adult medical examination without abnormal findings: Secondary | ICD-10-CM | POA: Diagnosis not present

## 2019-01-29 DIAGNOSIS — I10 Essential (primary) hypertension: Secondary | ICD-10-CM | POA: Diagnosis not present

## 2019-01-29 DIAGNOSIS — I129 Hypertensive chronic kidney disease with stage 1 through stage 4 chronic kidney disease, or unspecified chronic kidney disease: Secondary | ICD-10-CM | POA: Diagnosis not present

## 2019-01-29 DIAGNOSIS — E782 Mixed hyperlipidemia: Secondary | ICD-10-CM | POA: Diagnosis not present

## 2019-01-29 DIAGNOSIS — N183 Chronic kidney disease, stage 3 (moderate): Secondary | ICD-10-CM | POA: Diagnosis not present

## 2019-02-15 ENCOUNTER — Other Ambulatory Visit: Payer: Self-pay

## 2019-02-15 ENCOUNTER — Ambulatory Visit (HOSPITAL_COMMUNITY)
Admission: RE | Admit: 2019-02-15 | Discharge: 2019-02-15 | Disposition: A | Discharge status: Home / Self Care | Payer: Medicare Other | Source: Ambulatory Visit | Attending: Vascular Surgery | Admitting: Vascular Surgery

## 2019-02-15 DIAGNOSIS — I7121 Aneurysm of the ascending aorta, without rupture: Secondary | ICD-10-CM

## 2019-02-15 DIAGNOSIS — I713 Abdominal aortic aneurysm, ruptured, unspecified: Secondary | ICD-10-CM

## 2019-02-15 DIAGNOSIS — I714 Abdominal aortic aneurysm, without rupture: Secondary | ICD-10-CM | POA: Diagnosis not present

## 2019-02-15 DIAGNOSIS — I712 Thoracic aortic aneurysm, without rupture: Secondary | ICD-10-CM | POA: Diagnosis not present

## 2019-02-15 MED ORDER — IOHEXOL 350 MG/ML SOLN
100.0000 mL | Freq: Once | INTRAVENOUS | Status: AC | PRN
  Administered 2019-02-15: 100 mL via INTRAVENOUS

## 2019-02-17 ENCOUNTER — Ambulatory Visit (INDEPENDENT_AMBULATORY_CARE_PROVIDER_SITE_OTHER): Payer: Medicare Other | Admitting: *Deleted

## 2019-02-17 DIAGNOSIS — I5032 Chronic diastolic (congestive) heart failure: Secondary | ICD-10-CM | POA: Diagnosis not present

## 2019-02-17 DIAGNOSIS — I442 Atrioventricular block, complete: Secondary | ICD-10-CM

## 2019-02-18 LAB — CUP PACEART REMOTE DEVICE CHECK
Battery Remaining Longevity: 30 mo
Battery Remaining Percentage: 47 %
Brady Statistic RA Percent Paced: 0 %
Brady Statistic RV Percent Paced: 99 %
Date Time Interrogation Session: 20201007053000
Implantable Lead Implant Date: 20131211
Implantable Lead Implant Date: 20131211
Implantable Lead Location: 753859
Implantable Lead Location: 753860
Implantable Lead Model: 4135
Implantable Lead Model: 4136
Implantable Lead Serial Number: 29296655
Implantable Lead Serial Number: 29317536
Implantable Pulse Generator Implant Date: 20131211
Lead Channel Impedance Value: 599 Ohm
Lead Channel Impedance Value: 658 Ohm
Lead Channel Pacing Threshold Amplitude: 0.9 V
Lead Channel Pacing Threshold Pulse Width: 0.5 ms
Lead Channel Setting Pacing Amplitude: 2.4 V
Lead Channel Setting Pacing Pulse Width: 0.5 ms
Lead Channel Setting Sensing Sensitivity: 2.5 mV
Pulse Gen Serial Number: 144309

## 2019-02-22 ENCOUNTER — Telehealth: Payer: Self-pay | Admitting: *Deleted

## 2019-02-22 NOTE — Telephone Encounter (Signed)
Virtual Visit Pre-Appointment Phone Call  Today, I spoke with [name] and performed the following actions:  1. I explained that we are currently trying to limit exposure to the COVID-19 virus by seeing patients at home rather than in the office.  I explained that the visits are best done by video, but can be done by telephone.  I asked the patient if a virtual visit that the patient would like to try instead of coming into the office. [name] agreed to proceed with the virtual visit scheduled with Servando Snare MD on 02/26/19.      2. I confirmed the BEST phone number to call the day of the visit and- I included this in appointment notes.  3. I asked if the patient had access to (through a family member/friend) a smartphone with video capability to be used for her visit?"  The patient said yes -    4. I confirmed consent by  a. sending through [place] or by email the [place] as written at the end of this message or  b. verbally as listed below. i. This visit is being performed in the setting of COVID-19. ii. All virtual visits are billed to your insurance company just like a normal visit would be.   iii. We'd like you to understand that the technology does not allow for your provider to perform an examination, and thus may limit your provider's ability to fully assess your condition.  iv. If your provider identifies any concerns that need to be evaluated in person, we will make arrangements to do so.   v. Finally, though the technology is pretty good, we cannot assure that it will always work on either your or our end, and in the setting of a video visit, we may have to convert it to a phone-only visit.  In either situation, we cannot ensure that we have a secure connection.   vi. Are you willing to proceed?"  STAFF: Did the patient verbally acknowledge consent to telehealth visit? Document YES/NO here: YES  2. I advised the patient to be  prepared - I asked that the patient, on the day of her visit, record any information possible with the equipment at her home, such as blood pressure, pulse, oxygen saturation, and your weight and write them all down. I asked the patient to have a pen and paper handy nearby the day of the visit as well.  3. If the patient was scheduled for a video visit, I informed the patient that the visit with the doctor would start with a text to the smartphone # given to [place] by the patient.         If the patient was scheduled for a telephone call, I informed the patient that the visit with the doctor would start with a call to the telephone # given to [place] by the patient.  4. I Informed patient they will receive a phone call 15 minutes prior to their appointment time from a [place] or nurse to review medications, allergies, etc. to prepare for the visit.    TELEPHONE CALL NOTE  [name] has been deemed a candidate for a follow-up tele-health visit to limit community exposure during the Covid-19 pandemic. I spoke with the patient via phone to ensure availability of phone/video source, confirm preferred email & phone number, and discuss instructions and expectations.  I reminded [name] to be prepared with any vital sign and/or heart rhythm  information that could potentially be obtained via home monitoring, at the time of her visit. I reminded [name] to expect a phone call prior to her visit.  [name], NT 02/22/2019 11:15 AM     FULL LENGTH CONSENT FOR TELE-HEALTH VISIT   I hereby voluntarily request, consent and authorize [facility] and its employed or contracted physicians, physician assistants, nurse practitioners or other licensed health care professionals (the Practitioner), to provide me with telemedicine health care services (the "Services") as deemed necessary by the treating Practitioner. I acknowledge and consent to receive the Services by the Practitioner via telemedicine.  I understand that the telemedicine visit will involve communicating with the Practitioner through live audiovisual communication technology and the disclosure of certain medical information by electronic transmission. I acknowledge that I have been given the opportunity to request an in-person assessment or other available alternative prior to the telemedicine visit and am voluntarily participating in the telemedicine visit.  I understand that I have the right to withhold or withdraw my consent to the use of telemedicine in the course of my care at any time, without affecting my right to future care or treatment, and that the Practitioner or I may terminate the telemedicine visit at any time. I understand that I have the right to inspect all information obtained and/or recorded in the course of the telemedicine visit and may receive copies of available information for a reasonable fee.  I understand that some of the potential risks of receiving the Services via telemedicine include:  [place] Delay or interruption in medical evaluation due to technological equipment failure or disruption; . Information transmitted may not be sufficient (e.g. poor resolution of images) to allow for appropriate medical decision making by the Practitioner; and/or  . In rare instances, security protocols could fail, causing a breach of personal health information.  Furthermore, I acknowledge that it is my responsibility to provide information about my medical history, conditions and care that is complete and accurate to the best of my ability. I acknowledge that Practitioner's advice, recommendations, and/or decision may be based on factors not within their control, such as incomplete or inaccurate data provided by me or distortions of diagnostic images or specimens that may result from electronic transmissions. I understand that the practice of medicine is not an exact science and that Practitioner makes no warranties or guarantees  regarding treatment outcomes. I acknowledge that I will receive a copy of this consent concurrently upon execution via email to the email address I last provided but may also request a printed copy by calling the office of [place].    I understand that my insurance will be billed for this visit.   I have read or had this consent read to me. . I understand the contents of this consent, which adequately explains the benefits and risks of the Services being provided via telemedicine.  . I have been provided ample opportunity to ask questions regarding this consent and the Services and have had my questions answered to my satisfaction. . I give my informed consent for the services to be provided through the use of telemedicine in my medical care  By participating in this telemedicine visit I agree to the above.

## 2019-02-24 DIAGNOSIS — M810 Age-related osteoporosis without current pathological fracture: Secondary | ICD-10-CM | POA: Diagnosis not present

## 2019-02-26 ENCOUNTER — Ambulatory Visit (INDEPENDENT_AMBULATORY_CARE_PROVIDER_SITE_OTHER): Payer: Medicare Other | Admitting: Vascular Surgery

## 2019-02-26 ENCOUNTER — Encounter: Payer: Self-pay | Admitting: Vascular Surgery

## 2019-02-26 ENCOUNTER — Other Ambulatory Visit: Payer: Self-pay

## 2019-02-26 VITALS — BP 117/69 | HR 81

## 2019-02-26 DIAGNOSIS — I713 Abdominal aortic aneurysm, ruptured, unspecified: Secondary | ICD-10-CM
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
## 2019-02-26 NOTE — Progress Notes (Signed)
Remote pacemaker transmission.   

## 2019-03-01 DIAGNOSIS — E782 Mixed hyperlipidemia: Secondary | ICD-10-CM | POA: Diagnosis not present

## 2019-03-01 DIAGNOSIS — R1314 Dysphagia, pharyngoesophageal phase: Secondary | ICD-10-CM | POA: Diagnosis not present

## 2019-03-01 DIAGNOSIS — R748 Abnormal levels of other serum enzymes: Secondary | ICD-10-CM | POA: Diagnosis not present

## 2019-03-01 DIAGNOSIS — J449 Chronic obstructive pulmonary disease, unspecified: Secondary | ICD-10-CM | POA: Diagnosis not present

## 2019-03-01 DIAGNOSIS — I129 Hypertensive chronic kidney disease with stage 1 through stage 4 chronic kidney disease, or unspecified chronic kidney disease: Secondary | ICD-10-CM | POA: Diagnosis not present

## 2019-03-01 DIAGNOSIS — N1831 Chronic kidney disease, stage 3a: Secondary | ICD-10-CM | POA: Diagnosis not present

## 2019-03-01 DIAGNOSIS — I714 Abdominal aortic aneurysm, without rupture: Secondary | ICD-10-CM | POA: Diagnosis not present

## 2019-03-01 DIAGNOSIS — I10 Essential (primary) hypertension: Secondary | ICD-10-CM | POA: Diagnosis not present

## 2019-03-01 DIAGNOSIS — M1180 Other specified crystal arthropathies, unspecified site: Secondary | ICD-10-CM | POA: Diagnosis not present

## 2019-03-01 DIAGNOSIS — I712 Thoracic aortic aneurysm, without rupture: Secondary | ICD-10-CM | POA: Diagnosis not present

## 2019-03-01 DIAGNOSIS — I7102 Dissection of abdominal aorta: Secondary | ICD-10-CM | POA: Diagnosis not present

## 2019-03-01 DIAGNOSIS — I480 Paroxysmal atrial fibrillation: Secondary | ICD-10-CM | POA: Diagnosis not present

## 2019-03-09 ENCOUNTER — Other Ambulatory Visit: Payer: Self-pay | Admitting: Internal Medicine

## 2019-03-09 DIAGNOSIS — R1314 Dysphagia, pharyngoesophageal phase: Secondary | ICD-10-CM

## 2019-03-17 ENCOUNTER — Ambulatory Visit
Admission: RE | Admit: 2019-03-17 | Discharge: 2019-03-17 | Disposition: A | Discharge status: Home / Self Care | Payer: Medicare Other | Source: Ambulatory Visit | Attending: Internal Medicine | Admitting: Internal Medicine

## 2019-03-17 ENCOUNTER — Other Ambulatory Visit: Payer: Self-pay | Admitting: Internal Medicine

## 2019-03-17 DIAGNOSIS — R1314 Dysphagia, pharyngoesophageal phase: Secondary | ICD-10-CM | POA: Diagnosis not present

## 2019-04-17 ENCOUNTER — Other Ambulatory Visit: Payer: Self-pay | Admitting: Physician Assistant

## 2019-04-17 DIAGNOSIS — Z950 Presence of cardiac pacemaker: Secondary | ICD-10-CM

## 2019-04-17 DIAGNOSIS — I480 Paroxysmal atrial fibrillation: Secondary | ICD-10-CM

## 2019-04-19 ENCOUNTER — Other Ambulatory Visit: Payer: Self-pay | Admitting: Physician Assistant

## 2019-04-19 DIAGNOSIS — I480 Paroxysmal atrial fibrillation: Secondary | ICD-10-CM

## 2019-04-19 DIAGNOSIS — Z950 Presence of cardiac pacemaker: Secondary | ICD-10-CM

## 2019-05-17 ENCOUNTER — Other Ambulatory Visit: Payer: Self-pay

## 2019-05-17 ENCOUNTER — Encounter: Payer: Self-pay | Admitting: Cardiovascular Disease

## 2019-05-17 ENCOUNTER — Ambulatory Visit (INDEPENDENT_AMBULATORY_CARE_PROVIDER_SITE_OTHER): Payer: Medicare Other | Admitting: Cardiovascular Disease

## 2019-05-17 VITALS — BP 106/80 | HR 82 | Temp 97.5°F | Ht 61.0 in | Wt 135.0 lb

## 2019-05-17 DIAGNOSIS — I4821 Permanent atrial fibrillation: Secondary | ICD-10-CM

## 2019-05-17 DIAGNOSIS — Z9889 Other specified postprocedural states: Secondary | ICD-10-CM

## 2019-05-17 DIAGNOSIS — Z8679 Personal history of other diseases of the circulatory system: Secondary | ICD-10-CM

## 2019-05-17 DIAGNOSIS — I351 Nonrheumatic aortic (valve) insufficiency: Secondary | ICD-10-CM

## 2019-05-17 DIAGNOSIS — E7800 Pure hypercholesterolemia, unspecified: Secondary | ICD-10-CM

## 2019-05-17 DIAGNOSIS — Z7901 Long term (current) use of anticoagulants: Secondary | ICD-10-CM

## 2019-05-17 DIAGNOSIS — I442 Atrioventricular block, complete: Secondary | ICD-10-CM | POA: Diagnosis not present

## 2019-05-17 DIAGNOSIS — I2510 Atherosclerotic heart disease of native coronary artery without angina pectoris: Secondary | ICD-10-CM | POA: Diagnosis not present

## 2019-05-17 DIAGNOSIS — I350 Nonrheumatic aortic (valve) stenosis: Secondary | ICD-10-CM

## 2019-05-17 DIAGNOSIS — I7121 Aneurysm of the ascending aorta, without rupture: Secondary | ICD-10-CM

## 2019-05-17 DIAGNOSIS — Z950 Presence of cardiac pacemaker: Secondary | ICD-10-CM

## 2019-05-17 DIAGNOSIS — I5032 Chronic diastolic (congestive) heart failure: Secondary | ICD-10-CM | POA: Diagnosis not present

## 2019-05-17 DIAGNOSIS — E875 Hyperkalemia: Secondary | ICD-10-CM

## 2019-05-17 DIAGNOSIS — I701 Atherosclerosis of renal artery: Secondary | ICD-10-CM | POA: Diagnosis not present

## 2019-05-17 DIAGNOSIS — I712 Thoracic aortic aneurysm, without rupture: Secondary | ICD-10-CM
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
## 2019-05-17 NOTE — Patient Instructions (Signed)
Medication Instructions:  No changes *If you need a refill on your cardiac medications before your next appointment, please call your pharmacy*  Lab Work: None ordered If you have labs (blood work) drawn today and your tests are completely normal, you will receive your results only by: . MyChart Message (if you have MyChart) OR . A paper copy in the mail If you have any lab test that is abnormal or we need to change your treatment, we will call you to review the results.  Testing/Procedures: None ordered  Follow-Up: At [facility], you and your health needs are our priority.  As part of our continuing mission to provide you with exceptional heart care, we have created designated Provider Care Teams.  These Care Teams include your primary Cardiologist (physician) and Advanced Practice Providers (APPs -  Physician Assistants and Nurse Practitioners) who all work together to provide you with the care you need, when you need it.  Your next appointment:   12 month(s)  The format for your next appointment:   Either In Person or Virtual  Provider:   Mihai Croitoru, MD    

## 2019-05-18 LAB — CUP PACEART REMOTE DEVICE CHECK
Battery Remaining Longevity: 24 mo
Battery Remaining Percentage: 41 %
Brady Statistic RA Percent Paced: 100 %
Brady Statistic RV Percent Paced: 99 %
Date Time Interrogation Session: 20210105013100
Implantable Lead Implant Date: 20131211
Implantable Lead Implant Date: 20131211
Implantable Lead Location: 753859
Implantable Lead Location: 753860
Implantable Lead Model: 4135
Implantable Lead Model: 4136
Implantable Lead Serial Number: 29296655
Implantable Lead Serial Number: 29317536
Implantable Pulse Generator Implant Date: 20131211
Lead Channel Impedance Value: 571 Ohm
Lead Channel Impedance Value: 648 Ohm
Lead Channel Pacing Threshold Amplitude: 0.9 V
Lead Channel Pacing Threshold Pulse Width: 0.5 ms
Lead Channel Setting Pacing Amplitude: 2.4 V
Lead Channel Setting Pacing Pulse Width: 0.5 ms
Lead Channel Setting Sensing Sensitivity: 2.5 mV
Pulse Gen Serial Number: 144309

## 2019-05-19 ENCOUNTER — Encounter: Payer: Self-pay | Admitting: Cardiovascular Disease

## 2019-05-19 ENCOUNTER — Ambulatory Visit (INDEPENDENT_AMBULATORY_CARE_PROVIDER_SITE_OTHER): Payer: Medicare Other | Admitting: *Deleted

## 2019-05-19 DIAGNOSIS — I5032 Chronic diastolic (congestive) heart failure: Secondary | ICD-10-CM | POA: Diagnosis not present

## 2019-07-05 ENCOUNTER — Other Ambulatory Visit: Payer: Self-pay

## 2019-07-05 MED ORDER — DABIGATRAN ETEXILATE MESYLATE 75 MG PO CAPS: ORAL_CAPSULE | ORAL | 6 refills | Status: DC

## 2019-07-06 ENCOUNTER — Other Ambulatory Visit: Payer: Self-pay | Admitting: Cardiovascular Disease

## 2019-07-06 NOTE — Telephone Encounter (Signed)
*  STAT* If patient is at the pharmacy, call can be transferred to refill team.   1. Which medications need to be refilled? (please list name of each medication and dose if known)  furosemide (LASIX) 40 MG tablet dabigatran (PRADAXA) 75 MG CAPS capsule  2. Which pharmacy/location (including street and city if local pharmacy) is medication to be sent to? [facility] [place], [place] [facility] [place]  3. Do they need a 30 day or 90 day supply? 90 days of lasix, 30 days of pradaxa

## 2019-07-07 ENCOUNTER — Other Ambulatory Visit: Payer: Self-pay

## 2019-07-07 MED ORDER — DABIGATRAN ETEXILATE MESYLATE 75 MG PO CAPS: ORAL_CAPSULE | ORAL | 6 refills | Status: DC

## 2019-07-07 MED ORDER — FUROSEMIDE 40 MG PO TABS: 40.0000 mg | ORAL_TABLET | Freq: Every day | ORAL | 3 refills | Status: DC

## 2019-07-07 NOTE — Telephone Encounter (Signed)
Lasix has already been refilled. Just need refill on Pradaxa. FYI. Thanks!

## 2019-07-19 ENCOUNTER — Other Ambulatory Visit: Payer: Self-pay | Admitting: Cardiovascular Disease

## 2019-07-19 NOTE — Telephone Encounter (Signed)
*  STAT* If patient is at the pharmacy, call can be transferred to refill team.   1. Which medications need to be refilled? (please list name of each medication and dose if known) metoprolol succinate (TOPROL-XL) 50 MG 24 hr tablet  2. Which pharmacy/location (including street and city if local pharmacy) is medication to be sent to? [facility] [place], [place]  3. Do they need a 30 day or 90 day supply? 90  Patient saw Dr. Sallyanne Kuster on 05/17/19 - she is not due until next year.

## 2019-07-20 ENCOUNTER — Other Ambulatory Visit: Payer: Self-pay

## 2019-07-20 DIAGNOSIS — Z950 Presence of cardiac pacemaker: Secondary | ICD-10-CM

## 2019-07-20 DIAGNOSIS — I480 Paroxysmal atrial fibrillation: Secondary | ICD-10-CM

## 2019-07-20 MED ORDER — METOPROLOL SUCCINATE ER 50 MG PO TB24: ORAL_TABLET | ORAL | 0 refills | Status: DC

## 2019-07-22 ENCOUNTER — Other Ambulatory Visit: Payer: Self-pay

## 2019-07-22 DIAGNOSIS — I480 Paroxysmal atrial fibrillation: Secondary | ICD-10-CM

## 2019-07-22 DIAGNOSIS — Z950 Presence of cardiac pacemaker: Secondary | ICD-10-CM

## 2019-07-22 MED ORDER — METOPROLOL SUCCINATE ER 50 MG PO TB24: ORAL_TABLET | ORAL | 3 refills | Status: DC

## 2019-07-28 DIAGNOSIS — C44321 Squamous cell carcinoma of skin of nose: Secondary | ICD-10-CM | POA: Diagnosis not present

## 2019-07-28 DIAGNOSIS — L905 Scar conditions and fibrosis of skin: Secondary | ICD-10-CM | POA: Diagnosis not present

## 2019-07-28 DIAGNOSIS — Z85828 Personal history of other malignant neoplasm of skin: Secondary | ICD-10-CM | POA: Diagnosis not present

## 2019-07-28 DIAGNOSIS — L821 Other seborrheic keratosis: Secondary | ICD-10-CM | POA: Diagnosis not present

## 2019-07-28 DIAGNOSIS — D485 Neoplasm of uncertain behavior of skin: Secondary | ICD-10-CM | POA: Diagnosis not present

## 2019-08-18 ENCOUNTER — Ambulatory Visit (INDEPENDENT_AMBULATORY_CARE_PROVIDER_SITE_OTHER): Payer: Medicare Other | Admitting: *Deleted

## 2019-08-18 DIAGNOSIS — I5032 Chronic diastolic (congestive) heart failure: Secondary | ICD-10-CM | POA: Diagnosis not present

## 2019-08-18 LAB — CUP PACEART REMOTE DEVICE CHECK
Battery Remaining Longevity: 24 mo
Battery Remaining Percentage: 36 %
Brady Statistic RA Percent Paced: 100 %
Brady Statistic RV Percent Paced: 99 %
Date Time Interrogation Session: 20210407021000
Implantable Lead Implant Date: 20131211
Implantable Lead Implant Date: 20131211
Implantable Lead Location: 753859
Implantable Lead Location: 753860
Implantable Lead Model: 4135
Implantable Lead Model: 4136
Implantable Lead Serial Number: 29296655
Implantable Lead Serial Number: 29317536
Implantable Pulse Generator Implant Date: 20131211
Lead Channel Impedance Value: 578 Ohm
Lead Channel Impedance Value: 662 Ohm
Lead Channel Pacing Threshold Amplitude: 0.9 V
Lead Channel Pacing Threshold Pulse Width: 0.5 ms
Lead Channel Setting Pacing Amplitude: 2.4 V
Lead Channel Setting Pacing Pulse Width: 0.5 ms
Lead Channel Setting Sensing Sensitivity: 2.5 mV
Pulse Gen Serial Number: 144309

## 2019-08-18 NOTE — Progress Notes (Signed)
PPM Remote  

## 2019-08-19 DIAGNOSIS — E782 Mixed hyperlipidemia: Secondary | ICD-10-CM | POA: Diagnosis not present

## 2019-08-19 DIAGNOSIS — I714 Abdominal aortic aneurysm, without rupture: Secondary | ICD-10-CM | POA: Diagnosis not present

## 2019-08-19 DIAGNOSIS — I129 Hypertensive chronic kidney disease with stage 1 through stage 4 chronic kidney disease, or unspecified chronic kidney disease: Secondary | ICD-10-CM | POA: Diagnosis not present

## 2019-08-19 DIAGNOSIS — J449 Chronic obstructive pulmonary disease, unspecified: Secondary | ICD-10-CM | POA: Diagnosis not present

## 2019-08-19 DIAGNOSIS — I7102 Dissection of abdominal aorta: Secondary | ICD-10-CM | POA: Diagnosis not present

## 2019-08-26 DIAGNOSIS — M810 Age-related osteoporosis without current pathological fracture: Secondary | ICD-10-CM | POA: Diagnosis not present

## 2019-08-26 DIAGNOSIS — I714 Abdominal aortic aneurysm, without rupture: Secondary | ICD-10-CM | POA: Diagnosis not present

## 2019-08-26 DIAGNOSIS — I129 Hypertensive chronic kidney disease with stage 1 through stage 4 chronic kidney disease, or unspecified chronic kidney disease: Secondary | ICD-10-CM | POA: Diagnosis not present

## 2019-08-26 DIAGNOSIS — I480 Paroxysmal atrial fibrillation: Secondary | ICD-10-CM | POA: Diagnosis not present

## 2019-08-26 DIAGNOSIS — E782 Mixed hyperlipidemia: Secondary | ICD-10-CM | POA: Diagnosis not present

## 2019-08-26 DIAGNOSIS — I10 Essential (primary) hypertension: Secondary | ICD-10-CM | POA: Diagnosis not present

## 2019-08-26 DIAGNOSIS — N1831 Chronic kidney disease, stage 3a: Secondary | ICD-10-CM | POA: Diagnosis not present

## 2019-08-26 DIAGNOSIS — M1180 Other specified crystal arthropathies, unspecified site: Secondary | ICD-10-CM | POA: Diagnosis not present

## 2019-08-26 DIAGNOSIS — I7102 Dissection of abdominal aorta: Secondary | ICD-10-CM | POA: Diagnosis not present

## 2019-08-26 DIAGNOSIS — H547 Unspecified visual loss: Secondary | ICD-10-CM | POA: Diagnosis not present

## 2019-08-26 DIAGNOSIS — J449 Chronic obstructive pulmonary disease, unspecified: Secondary | ICD-10-CM | POA: Diagnosis not present

## 2019-08-26 DIAGNOSIS — I712 Thoracic aortic aneurysm, without rupture: Secondary | ICD-10-CM | POA: Diagnosis not present

## 2019-08-30 DIAGNOSIS — D0439 Carcinoma in situ of skin of other parts of face: Secondary | ICD-10-CM | POA: Diagnosis not present

## 2019-09-15 ENCOUNTER — Other Ambulatory Visit: Payer: Self-pay | Admitting: *Deleted

## 2019-09-15 DIAGNOSIS — I713 Abdominal aortic aneurysm, ruptured, unspecified: Secondary | ICD-10-CM

## 2019-09-17 ENCOUNTER — Ambulatory Visit (HOSPITAL_COMMUNITY)
Admission: RE | Admit: 2019-09-17 | Discharge: 2019-09-17 | Disposition: A | Discharge status: Home / Self Care | Payer: Medicare Other | Source: Ambulatory Visit | Attending: Vascular Surgery | Admitting: Vascular Surgery

## 2019-09-17 ENCOUNTER — Other Ambulatory Visit: Payer: Self-pay

## 2019-09-17 ENCOUNTER — Ambulatory Visit (INDEPENDENT_AMBULATORY_CARE_PROVIDER_SITE_OTHER): Payer: Medicare Other | Admitting: Physician Assistant

## 2019-09-17 VITALS — BP 141/88 | HR 70 | Temp 97.8°F | Resp 16 | Ht 62.0 in | Wt 131.4 lb

## 2019-09-17 DIAGNOSIS — I7121 Aneurysm of the ascending aorta, without rupture: Secondary | ICD-10-CM

## 2019-09-17 DIAGNOSIS — I712 Thoracic aortic aneurysm, without rupture: Secondary | ICD-10-CM | POA: Diagnosis not present

## 2019-09-17 DIAGNOSIS — I713 Abdominal aortic aneurysm, ruptured, unspecified: Secondary | ICD-10-CM
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
## 2019-09-20 ENCOUNTER — Other Ambulatory Visit: Payer: Self-pay | Admitting: *Deleted

## 2019-09-20 DIAGNOSIS — I7121 Aneurysm of the ascending aorta, without rupture: Secondary | ICD-10-CM

## 2019-09-20 DIAGNOSIS — I712 Thoracic aortic aneurysm, without rupture: Secondary | ICD-10-CM

## 2019-10-05 ENCOUNTER — Other Ambulatory Visit: Payer: Self-pay

## 2019-10-05 MED ORDER — POTASSIUM CHLORIDE ER 10 MEQ PO TBCR: EXTENDED_RELEASE_TABLET | ORAL | 3 refills | Status: DC

## 2019-10-06 ENCOUNTER — Other Ambulatory Visit: Payer: Self-pay

## 2019-10-07 ENCOUNTER — Telehealth: Payer: Self-pay

## 2019-10-07 NOTE — Telephone Encounter (Signed)
   Primary Cardiologist: Sanda Klein, MD  Chart reviewed as part of pre-operative protocol coverage. Simple dental extractions are considered low risk procedures per guidelines and generally do not require any specific cardiac clearance. It is also generally accepted that for simple extractions and dental cleanings, there is no need to interrupt blood thinner therapy.   SBE prophylaxis is not required for the patient.  I will route this recommendation to the requesting party via Epic fax function and remove from pre-op pool.  Please call with questions.  Deberah Pelton, NP 10/07/2019, 10:49 AM

## 2019-10-07 NOTE — Telephone Encounter (Signed)
   [facility] Pre-operative Risk Assessment    HEARTCARE STAFF: - Please ensure there is not already an duplicate clearance open for this procedure. - Under Visit Info/Reason for Call, type in Other and utilize the format Clearance MM/DD/YY or Clearance TBD. Do not use dashes or single digits. - If request is for dental extraction, please clarify the # of teeth to be extracted.  Request for surgical clearance:  1. What type of surgery is being performed? 1 TOOTH EXTRACTION   2. When is this surgery scheduled? TBD   3. What type of clearance is required (medical clearance vs. Pharmacy clearance to hold med vs. Both)? PHARMACY  4. Are there any medications that need to be held prior to surgery and how long? PRADAXA    5. Practice name and name of physician performing surgery? [facility] [place]  [facility] ATTN:CAIT KIDWELL   6. What is the office phone number? [phone]   7.   What is the office fax number? [phone]  8.   Anesthesia type (None, local, MAC, general)? NONE LISTED- OFFICE WILL CB TO LET us KNOW

## 2019-11-17 ENCOUNTER — Ambulatory Visit (INDEPENDENT_AMBULATORY_CARE_PROVIDER_SITE_OTHER): Payer: Medicare Other | Admitting: *Deleted

## 2019-11-17 DIAGNOSIS — I442 Atrioventricular block, complete: Secondary | ICD-10-CM | POA: Diagnosis not present

## 2019-11-17 LAB — CUP PACEART REMOTE DEVICE CHECK
Battery Remaining Longevity: 18 mo
Battery Remaining Percentage: 27 %
Brady Statistic RA Percent Paced: 100 %
Brady Statistic RV Percent Paced: 99 %
Date Time Interrogation Session: 20210707025900
Implantable Lead Implant Date: 20131211
Implantable Lead Implant Date: 20131211
Implantable Lead Location: 753859
Implantable Lead Location: 753860
Implantable Lead Model: 4135
Implantable Lead Model: 4136
Implantable Lead Serial Number: 29296655
Implantable Lead Serial Number: 29317536
Implantable Pulse Generator Implant Date: 20131211
Lead Channel Impedance Value: 581 Ohm
Lead Channel Impedance Value: 649 Ohm
Lead Channel Pacing Threshold Amplitude: 0.9 V
Lead Channel Pacing Threshold Pulse Width: 0.5 ms
Lead Channel Setting Pacing Amplitude: 2.4 V
Lead Channel Setting Pacing Pulse Width: 0.5 ms
Lead Channel Setting Sensing Sensitivity: 2.5 mV
Pulse Gen Serial Number: 144309

## 2019-11-19 NOTE — Progress Notes (Signed)
Remote pacemaker transmission.   

## 2019-11-29 ENCOUNTER — Other Ambulatory Visit: Payer: Self-pay

## 2019-11-29 DIAGNOSIS — Z85828 Personal history of other malignant neoplasm of skin: Secondary | ICD-10-CM | POA: Diagnosis not present

## 2019-11-29 DIAGNOSIS — L819 Disorder of pigmentation, unspecified: Secondary | ICD-10-CM | POA: Diagnosis not present

## 2019-11-29 DIAGNOSIS — I713 Abdominal aortic aneurysm, ruptured, unspecified: Secondary | ICD-10-CM

## 2019-11-29 DIAGNOSIS — L905 Scar conditions and fibrosis of skin: Secondary | ICD-10-CM | POA: Diagnosis not present

## 2019-11-29 DIAGNOSIS — L570 Actinic keratosis: Secondary | ICD-10-CM | POA: Diagnosis not present

## 2019-11-29 DIAGNOSIS — L218 Other seborrheic dermatitis: Secondary | ICD-10-CM | POA: Diagnosis not present

## 2019-12-22 ENCOUNTER — Ambulatory Visit
Admission: RE | Admit: 2019-12-22 | Discharge: 2019-12-22 | Disposition: A | Discharge status: Home / Self Care | Payer: Medicare Other | Source: Ambulatory Visit | Attending: Vascular Surgery | Admitting: Vascular Surgery

## 2019-12-22 DIAGNOSIS — I713 Abdominal aortic aneurysm, ruptured, unspecified: Secondary | ICD-10-CM

## 2019-12-22 DIAGNOSIS — I712 Thoracic aortic aneurysm, without rupture: Secondary | ICD-10-CM | POA: Diagnosis not present

## 2019-12-22 DIAGNOSIS — I714 Abdominal aortic aneurysm, without rupture: Secondary | ICD-10-CM | POA: Diagnosis not present

## 2019-12-22 MED ORDER — IOPAMIDOL (ISOVUE-370) INJECTION 76%
75.0000 mL | Freq: Once | INTRAVENOUS | Status: AC | PRN
  Administered 2019-12-22: 75 mL via INTRAVENOUS

## 2019-12-27 ENCOUNTER — Other Ambulatory Visit: Payer: Self-pay

## 2019-12-27 ENCOUNTER — Ambulatory Visit (INDEPENDENT_AMBULATORY_CARE_PROVIDER_SITE_OTHER): Payer: Medicare Other | Admitting: Physician Assistant

## 2019-12-27 VITALS — BP 129/85 | Ht 62.0 in | Wt 125.0 lb

## 2019-12-27 DIAGNOSIS — I713 Abdominal aortic aneurysm, ruptured, unspecified: Secondary | ICD-10-CM

## 2019-12-27 DIAGNOSIS — I7121 Aneurysm of the ascending aorta, without rupture: Secondary | ICD-10-CM

## 2019-12-27 DIAGNOSIS — I712 Thoracic aortic aneurysm, without rupture: Secondary | ICD-10-CM
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
## 2020-01-31 DIAGNOSIS — L905 Scar conditions and fibrosis of skin: Secondary | ICD-10-CM | POA: Diagnosis not present

## 2020-01-31 DIAGNOSIS — Z85828 Personal history of other malignant neoplasm of skin: Secondary | ICD-10-CM | POA: Diagnosis not present

## 2020-02-07 ENCOUNTER — Other Ambulatory Visit: Payer: Self-pay

## 2020-02-07 MED ORDER — DABIGATRAN ETEXILATE MESYLATE 75 MG PO CAPS: ORAL_CAPSULE | ORAL | 6 refills | Status: DC

## 2020-02-16 ENCOUNTER — Ambulatory Visit (INDEPENDENT_AMBULATORY_CARE_PROVIDER_SITE_OTHER): Payer: Medicare Other

## 2020-02-16 DIAGNOSIS — I442 Atrioventricular block, complete: Secondary | ICD-10-CM | POA: Diagnosis not present

## 2020-02-17 LAB — CUP PACEART REMOTE DEVICE CHECK
Battery Remaining Longevity: 12 mo
Battery Remaining Percentage: 20 %
Brady Statistic RA Percent Paced: 100 %
Brady Statistic RV Percent Paced: 99 %
Date Time Interrogation Session: 20211006022100
Implantable Lead Implant Date: 20131211
Implantable Lead Implant Date: 20131211
Implantable Lead Location: 753859
Implantable Lead Location: 753860
Implantable Lead Model: 4135
Implantable Lead Model: 4136
Implantable Lead Serial Number: 29296655
Implantable Lead Serial Number: 29317536
Implantable Pulse Generator Implant Date: 20131211
Lead Channel Impedance Value: 564 Ohm
Lead Channel Impedance Value: 634 Ohm
Lead Channel Pacing Threshold Amplitude: 0.9 V
Lead Channel Pacing Threshold Pulse Width: 0.5 ms
Lead Channel Setting Pacing Amplitude: 2.4 V
Lead Channel Setting Pacing Pulse Width: 0.5 ms
Lead Channel Setting Sensing Sensitivity: 2.5 mV
Pulse Gen Serial Number: 144309

## 2020-02-21 NOTE — Progress Notes (Signed)
Remote pacemaker transmission.   

## 2020-03-02 DIAGNOSIS — J449 Chronic obstructive pulmonary disease, unspecified: Secondary | ICD-10-CM | POA: Diagnosis not present

## 2020-03-02 DIAGNOSIS — I129 Hypertensive chronic kidney disease with stage 1 through stage 4 chronic kidney disease, or unspecified chronic kidney disease: Secondary | ICD-10-CM | POA: Diagnosis not present

## 2020-03-02 DIAGNOSIS — I10 Essential (primary) hypertension: Secondary | ICD-10-CM | POA: Diagnosis not present

## 2020-03-02 DIAGNOSIS — E782 Mixed hyperlipidemia: Secondary | ICD-10-CM | POA: Diagnosis not present

## 2020-03-02 DIAGNOSIS — I480 Paroxysmal atrial fibrillation: Secondary | ICD-10-CM | POA: Diagnosis not present

## 2020-03-02 DIAGNOSIS — N390 Urinary tract infection, site not specified: Secondary | ICD-10-CM | POA: Diagnosis not present

## 2020-03-09 DIAGNOSIS — E782 Mixed hyperlipidemia: Secondary | ICD-10-CM | POA: Diagnosis not present

## 2020-03-09 DIAGNOSIS — I712 Thoracic aortic aneurysm, without rupture: Secondary | ICD-10-CM | POA: Diagnosis not present

## 2020-03-09 DIAGNOSIS — I480 Paroxysmal atrial fibrillation: Secondary | ICD-10-CM | POA: Diagnosis not present

## 2020-03-09 DIAGNOSIS — N1831 Chronic kidney disease, stage 3a: Secondary | ICD-10-CM | POA: Diagnosis not present

## 2020-03-09 DIAGNOSIS — I7102 Dissection of abdominal aorta: Secondary | ICD-10-CM | POA: Diagnosis not present

## 2020-03-09 DIAGNOSIS — I700 Atherosclerosis of aorta: Secondary | ICD-10-CM | POA: Diagnosis not present

## 2020-03-09 DIAGNOSIS — J449 Chronic obstructive pulmonary disease, unspecified: Secondary | ICD-10-CM | POA: Diagnosis not present

## 2020-03-09 DIAGNOSIS — I10 Essential (primary) hypertension: Secondary | ICD-10-CM | POA: Diagnosis not present

## 2020-03-09 DIAGNOSIS — I714 Abdominal aortic aneurysm, without rupture: Secondary | ICD-10-CM | POA: Diagnosis not present

## 2020-03-09 DIAGNOSIS — I129 Hypertensive chronic kidney disease with stage 1 through stage 4 chronic kidney disease, or unspecified chronic kidney disease: Secondary | ICD-10-CM | POA: Diagnosis not present

## 2020-03-09 DIAGNOSIS — Z23 Encounter for immunization: Secondary | ICD-10-CM | POA: Diagnosis not present

## 2020-03-09 DIAGNOSIS — Z0000 Encounter for general adult medical examination without abnormal findings: Secondary | ICD-10-CM | POA: Diagnosis not present

## 2020-03-23 DIAGNOSIS — H903 Sensorineural hearing loss, bilateral: Secondary | ICD-10-CM | POA: Diagnosis not present

## 2020-04-04 DIAGNOSIS — Z23 Encounter for immunization: Secondary | ICD-10-CM | POA: Diagnosis not present

## 2020-05-17 ENCOUNTER — Telehealth: Payer: Self-pay | Admitting: Emergency Medicine

## 2020-05-17 ENCOUNTER — Ambulatory Visit (INDEPENDENT_AMBULATORY_CARE_PROVIDER_SITE_OTHER): Payer: Medicare Other

## 2020-05-17 DIAGNOSIS — I442 Atrioventricular block, complete: Secondary | ICD-10-CM

## 2020-05-17 LAB — CUP PACEART REMOTE DEVICE CHECK
Battery Remaining Longevity: 8 mo
Battery Remaining Percentage: 12 %
Brady Statistic RA Percent Paced: 100 %
Brady Statistic RV Percent Paced: 99 %
Date Time Interrogation Session: 20220105013100
Implantable Lead Implant Date: 20131211
Implantable Lead Implant Date: 20131211
Implantable Lead Location: 753859
Implantable Lead Location: 753860
Implantable Lead Model: 4135
Implantable Lead Model: 4136
Implantable Lead Serial Number: 29296655
Implantable Lead Serial Number: 29317536
Implantable Pulse Generator Implant Date: 20131211
Lead Channel Impedance Value: 580 Ohm
Lead Channel Impedance Value: 598 Ohm
Lead Channel Pacing Threshold Amplitude: 0.9 V
Lead Channel Pacing Threshold Pulse Width: 0.5 ms
Lead Channel Setting Pacing Amplitude: 2.4 V
Lead Channel Setting Pacing Pulse Width: 0.5 ms
Lead Channel Setting Sensing Sensitivity: 2.5 mV
Pulse Gen Serial Number: 144309

## 2020-05-17 NOTE — Telephone Encounter (Signed)
Patient 8 months from ERI- monthly battery checks scheduled and patient notified.

## 2020-05-31 NOTE — Progress Notes (Signed)
Remote pacemaker transmission.   

## 2020-06-12 ENCOUNTER — Other Ambulatory Visit: Payer: Self-pay | Admitting: Cardiovascular Disease

## 2020-06-19 ENCOUNTER — Encounter: Payer: Self-pay | Admitting: Cardiovascular Disease

## 2020-06-19 ENCOUNTER — Ambulatory Visit (INDEPENDENT_AMBULATORY_CARE_PROVIDER_SITE_OTHER): Payer: Medicare Other | Admitting: Cardiovascular Disease

## 2020-06-19 ENCOUNTER — Other Ambulatory Visit: Payer: Self-pay

## 2020-06-19 ENCOUNTER — Ambulatory Visit (INDEPENDENT_AMBULATORY_CARE_PROVIDER_SITE_OTHER): Payer: Medicare Other

## 2020-06-19 VITALS — BP 150/88 | HR 85 | Ht 62.0 in | Wt 129.0 lb

## 2020-06-19 DIAGNOSIS — I714 Abdominal aortic aneurysm, without rupture, unspecified: Secondary | ICD-10-CM

## 2020-06-19 DIAGNOSIS — Z8679 Personal history of other diseases of the circulatory system: Secondary | ICD-10-CM

## 2020-06-19 DIAGNOSIS — I442 Atrioventricular block, complete: Secondary | ICD-10-CM | POA: Diagnosis not present

## 2020-06-19 DIAGNOSIS — Z9889 Other specified postprocedural states: Secondary | ICD-10-CM | POA: Diagnosis not present

## 2020-06-19 DIAGNOSIS — I2510 Atherosclerotic heart disease of native coronary artery without angina pectoris: Secondary | ICD-10-CM | POA: Diagnosis not present

## 2020-06-19 DIAGNOSIS — Z950 Presence of cardiac pacemaker: Secondary | ICD-10-CM | POA: Diagnosis not present

## 2020-06-19 DIAGNOSIS — I712 Thoracic aortic aneurysm, without rupture: Secondary | ICD-10-CM

## 2020-06-19 DIAGNOSIS — Z7901 Long term (current) use of anticoagulants: Secondary | ICD-10-CM | POA: Diagnosis not present

## 2020-06-19 DIAGNOSIS — I701 Atherosclerosis of renal artery: Secondary | ICD-10-CM | POA: Diagnosis not present

## 2020-06-19 DIAGNOSIS — I4821 Permanent atrial fibrillation: Secondary | ICD-10-CM

## 2020-06-19 DIAGNOSIS — E875 Hyperkalemia: Secondary | ICD-10-CM

## 2020-06-19 DIAGNOSIS — E7800 Pure hypercholesterolemia, unspecified: Secondary | ICD-10-CM

## 2020-06-19 DIAGNOSIS — I352 Nonrheumatic aortic (valve) stenosis with insufficiency: Secondary | ICD-10-CM | POA: Diagnosis not present

## 2020-06-19 DIAGNOSIS — I5032 Chronic diastolic (congestive) heart failure: Secondary | ICD-10-CM

## 2020-06-19 DIAGNOSIS — I7121 Aneurysm of the ascending aorta, without rupture: Secondary | ICD-10-CM

## 2020-06-19 NOTE — Patient Instructions (Signed)
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
## 2020-06-21 LAB — CUP PACEART REMOTE DEVICE CHECK
Battery Remaining Longevity: 8 mo
Battery Remaining Percentage: 12 %
Brady Statistic RA Percent Paced: 100 %
Brady Statistic RV Percent Paced: 99 %
Date Time Interrogation Session: 20220207013100
Implantable Lead Implant Date: 20131211
Implantable Lead Implant Date: 20131211
Implantable Lead Location: 753859
Implantable Lead Location: 753860
Implantable Lead Model: 4135
Implantable Lead Model: 4136
Implantable Lead Serial Number: 29296655
Implantable Lead Serial Number: 29317536
Implantable Pulse Generator Implant Date: 20131211
Lead Channel Impedance Value: 558 Ohm
Lead Channel Impedance Value: 601 Ohm
Lead Channel Pacing Threshold Amplitude: 0.9 V
Lead Channel Pacing Threshold Pulse Width: 0.5 ms
Lead Channel Setting Pacing Amplitude: 2.4 V
Lead Channel Setting Pacing Pulse Width: 0.5 ms
Lead Channel Setting Sensing Sensitivity: 2.5 mV
Pulse Gen Serial Number: 144309

## 2020-06-22 ENCOUNTER — Telehealth: Payer: Self-pay | Admitting: Emergency Medicine

## 2020-06-22 NOTE — Telephone Encounter (Signed)
Patient scheduled for monthly battery checks. Last remote on 06/19/2020 estimated 8 months until ERI.

## 2020-06-26 NOTE — Progress Notes (Signed)
Remote pacemaker transmission.   

## 2020-06-26 NOTE — Addendum Note (Signed)
Addended by: Cheri Kearns A on: 06/26/2020 09:08 AM   Modules accepted: Level of Service

## 2020-07-07 ENCOUNTER — Ambulatory Visit (INDEPENDENT_AMBULATORY_CARE_PROVIDER_SITE_OTHER): Payer: Medicare Other | Admitting: Vascular Surgery

## 2020-07-07 ENCOUNTER — Ambulatory Visit (HOSPITAL_COMMUNITY)
Admission: RE | Admit: 2020-07-07 | Discharge: 2020-07-07 | Disposition: A | Discharge status: Home / Self Care | Payer: Medicare Other | Source: Ambulatory Visit | Attending: Vascular Surgery | Admitting: Vascular Surgery

## 2020-07-07 ENCOUNTER — Encounter: Payer: Self-pay | Admitting: Vascular Surgery

## 2020-07-07 ENCOUNTER — Other Ambulatory Visit: Payer: Self-pay

## 2020-07-07 VITALS — BP 146/89 | HR 90 | Temp 97.5°F | Resp 20 | Ht 62.0 in | Wt 129.0 lb

## 2020-07-07 DIAGNOSIS — I713 Abdominal aortic aneurysm, ruptured, unspecified: Secondary | ICD-10-CM

## 2020-07-07 DIAGNOSIS — I712 Thoracic aortic aneurysm, without rupture: Secondary | ICD-10-CM

## 2020-07-07 DIAGNOSIS — I7121 Aneurysm of the ascending aorta, without rupture: Secondary | ICD-10-CM
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
## 2020-07-12 ENCOUNTER — Other Ambulatory Visit: Payer: Self-pay

## 2020-07-12 DIAGNOSIS — I713 Abdominal aortic aneurysm, ruptured, unspecified: Secondary | ICD-10-CM

## 2020-07-24 ENCOUNTER — Ambulatory Visit (INDEPENDENT_AMBULATORY_CARE_PROVIDER_SITE_OTHER): Payer: Medicare Other

## 2020-07-24 DIAGNOSIS — I442 Atrioventricular block, complete: Secondary | ICD-10-CM

## 2020-07-26 LAB — CUP PACEART REMOTE DEVICE CHECK
Battery Remaining Longevity: 5 mo
Battery Remaining Percentage: 7 %
Brady Statistic RA Percent Paced: 0 %
Brady Statistic RV Percent Paced: 99 %
Date Time Interrogation Session: 20220314013100
Implantable Lead Implant Date: 20131211
Implantable Lead Implant Date: 20131211
Implantable Lead Location: 753859
Implantable Lead Location: 753860
Implantable Lead Model: 4135
Implantable Lead Model: 4136
Implantable Lead Serial Number: 29296655
Implantable Lead Serial Number: 29317536
Implantable Pulse Generator Implant Date: 20131211
Lead Channel Impedance Value: 564 Ohm
Lead Channel Impedance Value: 601 Ohm
Lead Channel Pacing Threshold Amplitude: 0.9 V
Lead Channel Pacing Threshold Pulse Width: 0.5 ms
Lead Channel Setting Pacing Amplitude: 2.4 V
Lead Channel Setting Pacing Pulse Width: 0.5 ms
Lead Channel Setting Sensing Sensitivity: 2.5 mV
Pulse Gen Serial Number: 144309

## 2020-08-02 NOTE — Addendum Note (Signed)
Addended by: Douglass Rivers D on: 08/02/2020 09:43 AM   Modules accepted: Level of Service

## 2020-08-02 NOTE — Progress Notes (Signed)
Remote pacemaker transmission.   

## 2020-08-10 ENCOUNTER — Other Ambulatory Visit: Payer: Self-pay

## 2020-08-10 ENCOUNTER — Ambulatory Visit
Admission: RE | Admit: 2020-08-10 | Discharge: 2020-08-10 | Disposition: A | Discharge status: Home / Self Care | Payer: Medicare Other | Source: Ambulatory Visit | Attending: Vascular Surgery | Admitting: Vascular Surgery

## 2020-08-10 DIAGNOSIS — I713 Abdominal aortic aneurysm, ruptured, unspecified: Secondary | ICD-10-CM

## 2020-08-10 DIAGNOSIS — I2510 Atherosclerotic heart disease of native coronary artery without angina pectoris: Secondary | ICD-10-CM | POA: Diagnosis not present

## 2020-08-10 DIAGNOSIS — I70203 Unspecified atherosclerosis of native arteries of extremities, bilateral legs: Secondary | ICD-10-CM | POA: Diagnosis not present

## 2020-08-10 DIAGNOSIS — I712 Thoracic aortic aneurysm, without rupture: Secondary | ICD-10-CM | POA: Diagnosis not present

## 2020-08-10 DIAGNOSIS — N2889 Other specified disorders of kidney and ureter: Secondary | ICD-10-CM | POA: Diagnosis not present

## 2020-08-10 DIAGNOSIS — I771 Stricture of artery: Secondary | ICD-10-CM | POA: Diagnosis not present

## 2020-08-10 DIAGNOSIS — I714 Abdominal aortic aneurysm, without rupture: Secondary | ICD-10-CM | POA: Diagnosis not present

## 2020-08-10 MED ORDER — IOPAMIDOL (ISOVUE-370) INJECTION 76%
75.0000 mL | Freq: Once | INTRAVENOUS | Status: AC | PRN
  Administered 2020-08-10: 75 mL via INTRAVENOUS

## 2020-08-18 ENCOUNTER — Other Ambulatory Visit: Payer: Self-pay

## 2020-08-18 ENCOUNTER — Ambulatory Visit (INDEPENDENT_AMBULATORY_CARE_PROVIDER_SITE_OTHER): Payer: Medicare Other | Admitting: Vascular Surgery

## 2020-08-18 ENCOUNTER — Encounter: Payer: Self-pay | Admitting: Vascular Surgery

## 2020-08-18 VITALS — BP 138/82 | HR 83 | Temp 98.0°F | Resp 16 | Ht 62.0 in | Wt 129.0 lb

## 2020-08-18 DIAGNOSIS — I713 Abdominal aortic aneurysm, ruptured, unspecified: Secondary | ICD-10-CM
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
## 2020-08-28 ENCOUNTER — Ambulatory Visit (INDEPENDENT_AMBULATORY_CARE_PROVIDER_SITE_OTHER): Payer: Medicare Other

## 2020-08-28 DIAGNOSIS — I442 Atrioventricular block, complete: Secondary | ICD-10-CM | POA: Diagnosis not present

## 2020-08-30 LAB — CUP PACEART REMOTE DEVICE CHECK
Battery Remaining Longevity: 5 mo
Battery Remaining Percentage: 7 %
Brady Statistic RA Percent Paced: 0 %
Brady Statistic RV Percent Paced: 99 %
Date Time Interrogation Session: 20220418013100
Implantable Lead Implant Date: 20131211
Implantable Lead Implant Date: 20131211
Implantable Lead Location: 753859
Implantable Lead Location: 753860
Implantable Lead Model: 4135
Implantable Lead Model: 4136
Implantable Lead Serial Number: 29296655
Implantable Lead Serial Number: 29317536
Implantable Pulse Generator Implant Date: 20131211
Lead Channel Impedance Value: 572 Ohm
Lead Channel Impedance Value: 604 Ohm
Lead Channel Pacing Threshold Amplitude: 0.9 V
Lead Channel Pacing Threshold Pulse Width: 0.5 ms
Lead Channel Setting Pacing Amplitude: 2.4 V
Lead Channel Setting Pacing Pulse Width: 0.5 ms
Lead Channel Setting Sensing Sensitivity: 2.5 mV
Pulse Gen Serial Number: 144309

## 2020-09-05 DIAGNOSIS — I129 Hypertensive chronic kidney disease with stage 1 through stage 4 chronic kidney disease, or unspecified chronic kidney disease: Secondary | ICD-10-CM | POA: Diagnosis not present

## 2020-09-05 DIAGNOSIS — I10 Essential (primary) hypertension: Secondary | ICD-10-CM | POA: Diagnosis not present

## 2020-09-05 DIAGNOSIS — I700 Atherosclerosis of aorta: Secondary | ICD-10-CM | POA: Diagnosis not present

## 2020-09-05 DIAGNOSIS — E782 Mixed hyperlipidemia: Secondary | ICD-10-CM | POA: Diagnosis not present

## 2020-09-05 DIAGNOSIS — I480 Paroxysmal atrial fibrillation: Secondary | ICD-10-CM | POA: Diagnosis not present

## 2020-09-05 DIAGNOSIS — N1831 Chronic kidney disease, stage 3a: Secondary | ICD-10-CM | POA: Diagnosis not present

## 2020-09-12 DIAGNOSIS — I700 Atherosclerosis of aorta: Secondary | ICD-10-CM | POA: Diagnosis not present

## 2020-09-12 DIAGNOSIS — I10 Essential (primary) hypertension: Secondary | ICD-10-CM | POA: Diagnosis not present

## 2020-09-12 DIAGNOSIS — I714 Abdominal aortic aneurysm, without rupture: Secondary | ICD-10-CM | POA: Diagnosis not present

## 2020-09-12 DIAGNOSIS — M810 Age-related osteoporosis without current pathological fracture: Secondary | ICD-10-CM | POA: Diagnosis not present

## 2020-09-12 DIAGNOSIS — N289 Disorder of kidney and ureter, unspecified: Secondary | ICD-10-CM | POA: Diagnosis not present

## 2020-09-12 DIAGNOSIS — E782 Mixed hyperlipidemia: Secondary | ICD-10-CM | POA: Diagnosis not present

## 2020-09-12 DIAGNOSIS — N1831 Chronic kidney disease, stage 3a: Secondary | ICD-10-CM | POA: Diagnosis not present

## 2020-09-14 ENCOUNTER — Other Ambulatory Visit: Payer: Self-pay | Admitting: *Deleted

## 2020-09-14 DIAGNOSIS — Z950 Presence of cardiac pacemaker: Secondary | ICD-10-CM

## 2020-09-14 DIAGNOSIS — I480 Paroxysmal atrial fibrillation: Secondary | ICD-10-CM

## 2020-09-14 MED ORDER — METOPROLOL SUCCINATE ER 50 MG PO TB24: ORAL_TABLET | ORAL | 3 refills | Status: DC

## 2020-09-14 NOTE — Telephone Encounter (Signed)
Rx(s) sent to pharmacy electronically.  

## 2020-09-14 NOTE — Progress Notes (Signed)
Remote pacemaker transmission.   

## 2020-09-21 DIAGNOSIS — D4102 Neoplasm of uncertain behavior of left kidney: Secondary | ICD-10-CM | POA: Diagnosis not present

## 2020-09-21 DIAGNOSIS — D4101 Neoplasm of uncertain behavior of right kidney: Secondary | ICD-10-CM | POA: Diagnosis not present

## 2020-10-02 ENCOUNTER — Ambulatory Visit (INDEPENDENT_AMBULATORY_CARE_PROVIDER_SITE_OTHER): Payer: Medicare Other

## 2020-10-02 DIAGNOSIS — I4821 Permanent atrial fibrillation: Secondary | ICD-10-CM

## 2020-10-03 LAB — CUP PACEART REMOTE DEVICE CHECK
Battery Remaining Longevity: 5 mo
Battery Remaining Percentage: 7 %
Brady Statistic RA Percent Paced: 0 %
Brady Statistic RV Percent Paced: 99 %
Date Time Interrogation Session: 20220523013100
Implantable Lead Implant Date: 20131211
Implantable Lead Implant Date: 20131211
Implantable Lead Location: 753859
Implantable Lead Location: 753860
Implantable Lead Model: 4135
Implantable Lead Model: 4136
Implantable Lead Serial Number: 29296655
Implantable Lead Serial Number: 29317536
Implantable Pulse Generator Implant Date: 20131211
Lead Channel Impedance Value: 557 Ohm
Lead Channel Impedance Value: 595 Ohm
Lead Channel Pacing Threshold Amplitude: 0.9 V
Lead Channel Pacing Threshold Pulse Width: 0.5 ms
Lead Channel Setting Pacing Amplitude: 2.4 V
Lead Channel Setting Pacing Pulse Width: 0.5 ms
Lead Channel Setting Sensing Sensitivity: 2.5 mV
Pulse Gen Serial Number: 144309

## 2020-10-24 NOTE — Progress Notes (Signed)
Remote pacemaker transmission.   

## 2020-10-24 NOTE — Addendum Note (Signed)
Addended by: Douglass Rivers D on: 10/24/2020 10:47 AM   Modules accepted: Level of Service

## 2020-11-06 ENCOUNTER — Ambulatory Visit (INDEPENDENT_AMBULATORY_CARE_PROVIDER_SITE_OTHER): Payer: Medicare Other

## 2020-11-06 DIAGNOSIS — I442 Atrioventricular block, complete: Secondary | ICD-10-CM

## 2020-11-07 LAB — CUP PACEART REMOTE DEVICE CHECK
Battery Remaining Longevity: 3 mo — CL
Battery Remaining Percentage: 3 %
Brady Statistic RA Percent Paced: 0 %
Brady Statistic RV Percent Paced: 99 %
Date Time Interrogation Session: 20220627013200
Implantable Lead Implant Date: 20131211
Implantable Lead Implant Date: 20131211
Implantable Lead Location: 753859
Implantable Lead Location: 753860
Implantable Lead Model: 4135
Implantable Lead Model: 4136
Implantable Lead Serial Number: 29296655
Implantable Lead Serial Number: 29317536
Implantable Pulse Generator Implant Date: 20131211
Lead Channel Impedance Value: 555 Ohm
Lead Channel Impedance Value: 599 Ohm
Lead Channel Pacing Threshold Amplitude: 0.9 V
Lead Channel Pacing Threshold Pulse Width: 0.5 ms
Lead Channel Setting Pacing Amplitude: 2.4 V
Lead Channel Setting Pacing Pulse Width: 0.5 ms
Lead Channel Setting Sensing Sensitivity: 2.5 mV
Pulse Gen Serial Number: 144309

## 2020-11-24 NOTE — Progress Notes (Signed)
Remote pacemaker transmission.   

## 2020-11-24 NOTE — Addendum Note (Signed)
Addended by: Cheri Kearns A on: 11/24/2020 02:17 PM   Modules accepted: Level of Service

## 2020-12-07 ENCOUNTER — Ambulatory Visit (INDEPENDENT_AMBULATORY_CARE_PROVIDER_SITE_OTHER): Payer: Medicare Other

## 2020-12-07 DIAGNOSIS — I4821 Permanent atrial fibrillation: Secondary | ICD-10-CM

## 2020-12-08 ENCOUNTER — Other Ambulatory Visit: Payer: Self-pay | Admitting: Cardiovascular Disease

## 2020-12-08 LAB — CUP PACEART REMOTE DEVICE CHECK
Battery Remaining Longevity: 3 mo — CL
Battery Remaining Percentage: 3 %
Brady Statistic RA Percent Paced: 0 %
Brady Statistic RV Percent Paced: 99 %
Date Time Interrogation Session: 20220728013100
Implantable Lead Implant Date: 20131211
Implantable Lead Implant Date: 20131211
Implantable Lead Location: 753859
Implantable Lead Location: 753860
Implantable Lead Model: 4135
Implantable Lead Model: 4136
Implantable Lead Serial Number: 29296655
Implantable Lead Serial Number: 29317536
Implantable Pulse Generator Implant Date: 20131211
Lead Channel Impedance Value: 591 Ohm
Lead Channel Impedance Value: 626 Ohm
Lead Channel Pacing Threshold Amplitude: 0.9 V
Lead Channel Pacing Threshold Pulse Width: 0.5 ms
Lead Channel Setting Pacing Amplitude: 2.4 V
Lead Channel Setting Pacing Pulse Width: 0.5 ms
Lead Channel Setting Sensing Sensitivity: 2.5 mV
Pulse Gen Serial Number: 144309

## 2021-01-03 NOTE — Progress Notes (Signed)
Remote pacemaker transmission.   

## 2021-01-03 NOTE — Addendum Note (Signed)
Addended by: Douglass Rivers D on: 01/03/2021 10:19 AM   Modules accepted: Level of Service

## 2021-01-08 ENCOUNTER — Encounter: Payer: Self-pay | Admitting: Cardiovascular Disease

## 2021-01-08 ENCOUNTER — Other Ambulatory Visit: Payer: Self-pay

## 2021-01-08 ENCOUNTER — Ambulatory Visit (INDEPENDENT_AMBULATORY_CARE_PROVIDER_SITE_OTHER): Payer: Medicare Other

## 2021-01-08 ENCOUNTER — Ambulatory Visit (INDEPENDENT_AMBULATORY_CARE_PROVIDER_SITE_OTHER): Payer: Medicare Other | Admitting: Cardiovascular Disease

## 2021-01-08 VITALS — BP 144/79 | HR 78 | Ht 62.0 in | Wt 125.6 lb

## 2021-01-08 DIAGNOSIS — Z950 Presence of cardiac pacemaker: Secondary | ICD-10-CM | POA: Diagnosis not present

## 2021-01-08 DIAGNOSIS — Z9889 Other specified postprocedural states: Secondary | ICD-10-CM

## 2021-01-08 DIAGNOSIS — I712 Thoracic aortic aneurysm, without rupture: Secondary | ICD-10-CM | POA: Diagnosis not present

## 2021-01-08 DIAGNOSIS — I352 Nonrheumatic aortic (valve) stenosis with insufficiency: Secondary | ICD-10-CM

## 2021-01-08 DIAGNOSIS — E7800 Pure hypercholesterolemia, unspecified: Secondary | ICD-10-CM | POA: Diagnosis not present

## 2021-01-08 DIAGNOSIS — E875 Hyperkalemia: Secondary | ICD-10-CM | POA: Diagnosis not present

## 2021-01-08 DIAGNOSIS — I4821 Permanent atrial fibrillation: Secondary | ICD-10-CM | POA: Diagnosis not present

## 2021-01-08 DIAGNOSIS — Z7901 Long term (current) use of anticoagulants: Secondary | ICD-10-CM

## 2021-01-08 DIAGNOSIS — I208 Other forms of angina pectoris: Secondary | ICD-10-CM

## 2021-01-08 DIAGNOSIS — I5032 Chronic diastolic (congestive) heart failure: Secondary | ICD-10-CM | POA: Diagnosis not present

## 2021-01-08 DIAGNOSIS — I442 Atrioventricular block, complete: Secondary | ICD-10-CM | POA: Diagnosis not present

## 2021-01-08 DIAGNOSIS — I701 Atherosclerosis of renal artery: Secondary | ICD-10-CM

## 2021-01-08 DIAGNOSIS — I350 Nonrheumatic aortic (valve) stenosis: Secondary | ICD-10-CM

## 2021-01-08 DIAGNOSIS — Z8679 Personal history of other diseases of the circulatory system: Secondary | ICD-10-CM

## 2021-01-08 DIAGNOSIS — I7121 Aneurysm of the ascending aorta, without rupture: Secondary | ICD-10-CM

## 2021-01-08 LAB — CUP PACEART REMOTE DEVICE CHECK
Battery Remaining Longevity: 3 mo — CL
Battery Remaining Percentage: 3 %
Brady Statistic RA Percent Paced: 0 %
Brady Statistic RV Percent Paced: 99 %
Date Time Interrogation Session: 20220829013100
Implantable Lead Implant Date: 20131211
Implantable Lead Implant Date: 20131211
Implantable Lead Location: 753859
Implantable Lead Location: 753860
Implantable Lead Model: 4135
Implantable Lead Model: 4136
Implantable Lead Serial Number: 29296655
Implantable Lead Serial Number: 29317536
Implantable Pulse Generator Implant Date: 20131211
Lead Channel Impedance Value: 549 Ohm
Lead Channel Impedance Value: 576 Ohm
Lead Channel Pacing Threshold Amplitude: 0.9 V
Lead Channel Pacing Threshold Pulse Width: 0.5 ms
Lead Channel Setting Pacing Amplitude: 2.4 V
Lead Channel Setting Pacing Pulse Width: 0.5 ms
Lead Channel Setting Sensing Sensitivity: 2.5 mV
Pulse Gen Serial Number: 144309

## 2021-01-08 MED ORDER — ISOSORBIDE MONONITRATE ER 30 MG PO TB24: 15.0000 mg | ORAL_TABLET | Freq: Every day | ORAL | 1 refills | Status: DC

## 2021-01-08 MED ORDER — NITROGLYCERIN 0.4 MG SL SUBL: 0.4000 mg | SUBLINGUAL_TABLET | SUBLINGUAL | 3 refills | Status: DC | PRN

## 2021-01-08 NOTE — Patient Instructions (Signed)
Medication Instructions:  START: NITROGLYCERIN- YOU MAY TAKE 1 TABLET (UNDER THE TONGUE) EVERY 5 MINUTES AS NEEDED FOR CHEST PAIN. DO NOT TAKE MORE THAN 3 DOSES IN 15 MINUTES.  NITROGLYCERIN  is a type of vasodilator. It relaxes blood vessels, increasing the blood and oxygen supply to your heart. This medicine is used to relieve chest pain caused by angina.  In an angina attack (CHEST PAIN), you should feel better within 5 minutes after your first dose. Do not swallow whole. Place tablet under your tongue. Sit down when taking this medicine. You can take a dose every 5 minutes up to a total of 3 doses. If you do not feel better or feel worse after 1 dose, call 9-1-1 at once. Do not take more than 3 doses in 15 minutes. Do not take your medicine more often than directed.  START: ISOSORBIDE: 15 mg DAILY (1/2 TABLET) *If you need a refill on your cardiac medications before your next appointment, please call your pharmacy*  Testing/Procedures: Your physician has requested that you have an echocardiogram. Echocardiography is a painless test that uses sound waves to create images of your heart. It provides your doctor with information about the size and shape of your heart and how well your heart's chambers and valves are working. You may receive an ultrasound enhancing agent through an IV if needed to better visualize your heart during the echo.This procedure takes approximately one hour. There are no restrictions for this procedure. This will take place at the [address], [address].   Follow-Up: At [facility], you and your health needs are our priority.  As part of our continuing mission to provide you with exceptional heart care, we have created designated Provider Care Teams.  These Care Teams include your primary Cardiologist (physician) and Advanced Practice Providers (APPs -  Physician Assistants and Nurse Practitioners) who all work together to provide you with the care you need, when you  need it.  Your next appointment:   3 month(s)  The format for your next appointment:   In Person  Provider:   Sanda Klein, MD
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
## 2021-01-22 NOTE — Progress Notes (Signed)
Remote pacemaker transmission.   

## 2021-01-29 ENCOUNTER — Other Ambulatory Visit: Payer: Self-pay

## 2021-01-29 ENCOUNTER — Ambulatory Visit (HOSPITAL_COMMUNITY): Payer: Medicare Other | Attending: Cardiology

## 2021-01-29 DIAGNOSIS — I352 Nonrheumatic aortic (valve) stenosis with insufficiency: Secondary | ICD-10-CM | POA: Diagnosis not present

## 2021-01-29 LAB — ECHOCARDIOGRAM COMPLETE
AR max vel: 1.84 cm2
AV Area VTI: 2.04 cm2
AV Area mean vel: 1.86 cm2
AV Mean grad: 12 mmHg
AV Peak grad: 22.8 mmHg
Ao pk vel: 2.39 m/s
MV M vel: 5.76 m/s
MV Peak grad: 132.7 mmHg
P 1/2 time: 440 msec
Radius: 0.5 cm
S' Lateral: 2.9 cm

## 2021-01-31 ENCOUNTER — Other Ambulatory Visit: Payer: Self-pay | Admitting: Cardiovascular Disease

## 2021-02-01 DIAGNOSIS — L819 Disorder of pigmentation, unspecified: Secondary | ICD-10-CM | POA: Diagnosis not present

## 2021-02-01 DIAGNOSIS — Z08 Encounter for follow-up examination after completed treatment for malignant neoplasm: Secondary | ICD-10-CM | POA: Diagnosis not present

## 2021-02-01 DIAGNOSIS — L905 Scar conditions and fibrosis of skin: Secondary | ICD-10-CM | POA: Diagnosis not present

## 2021-02-01 DIAGNOSIS — Z85828 Personal history of other malignant neoplasm of skin: Secondary | ICD-10-CM | POA: Diagnosis not present

## 2021-02-01 DIAGNOSIS — L570 Actinic keratosis: Secondary | ICD-10-CM | POA: Diagnosis not present

## 2021-02-04 ENCOUNTER — Other Ambulatory Visit: Payer: Self-pay

## 2021-02-04 ENCOUNTER — Observation Stay (HOSPITAL_COMMUNITY): Payer: Medicare Other

## 2021-02-04 ENCOUNTER — Encounter (HOSPITAL_COMMUNITY): Payer: Self-pay | Admitting: *Deleted

## 2021-02-04 ENCOUNTER — Emergency Department (HOSPITAL_COMMUNITY): Payer: Medicare Other

## 2021-02-04 ENCOUNTER — Inpatient Hospital Stay (HOSPITAL_COMMUNITY)
Admission: EM | Admit: 2021-02-04 | Discharge: 2021-02-10 | DRG: 444 | Disposition: A | Discharge status: Home Health | Payer: Medicare Other | Attending: Internal Medicine | Admitting: Internal Medicine

## 2021-02-04 DIAGNOSIS — R1084 Generalized abdominal pain: Secondary | ICD-10-CM | POA: Diagnosis not present

## 2021-02-04 DIAGNOSIS — R079 Chest pain, unspecified: Secondary | ICD-10-CM | POA: Diagnosis present

## 2021-02-04 DIAGNOSIS — I130 Hypertensive heart and chronic kidney disease with heart failure and stage 1 through stage 4 chronic kidney disease, or unspecified chronic kidney disease: Secondary | ICD-10-CM | POA: Diagnosis present

## 2021-02-04 DIAGNOSIS — I714 Abdominal aortic aneurysm, without rupture, unspecified: Secondary | ICD-10-CM | POA: Diagnosis present

## 2021-02-04 DIAGNOSIS — Z79899 Other long term (current) drug therapy: Secondary | ICD-10-CM

## 2021-02-04 DIAGNOSIS — G9341 Metabolic encephalopathy: Secondary | ICD-10-CM | POA: Diagnosis not present

## 2021-02-04 DIAGNOSIS — Z20822 Contact with and (suspected) exposure to covid-19: Secondary | ICD-10-CM | POA: Diagnosis present

## 2021-02-04 DIAGNOSIS — K649 Unspecified hemorrhoids: Secondary | ICD-10-CM | POA: Diagnosis present

## 2021-02-04 DIAGNOSIS — I2510 Atherosclerotic heart disease of native coronary artery without angina pectoris: Secondary | ICD-10-CM | POA: Diagnosis not present

## 2021-02-04 DIAGNOSIS — K219 Gastro-esophageal reflux disease without esophagitis: Secondary | ICD-10-CM | POA: Diagnosis present

## 2021-02-04 DIAGNOSIS — D1771 Benign lipomatous neoplasm of kidney: Secondary | ICD-10-CM | POA: Diagnosis present

## 2021-02-04 DIAGNOSIS — Z961 Presence of intraocular lens: Secondary | ICD-10-CM | POA: Diagnosis present

## 2021-02-04 DIAGNOSIS — Z419 Encounter for procedure for purposes other than remedying health state, unspecified: Secondary | ICD-10-CM

## 2021-02-04 DIAGNOSIS — J309 Allergic rhinitis, unspecified: Secondary | ICD-10-CM | POA: Diagnosis present

## 2021-02-04 DIAGNOSIS — Z9049 Acquired absence of other specified parts of digestive tract: Secondary | ICD-10-CM

## 2021-02-04 DIAGNOSIS — R748 Abnormal levels of other serum enzymes: Secondary | ICD-10-CM

## 2021-02-04 DIAGNOSIS — I712 Thoracic aortic aneurysm, without rupture: Secondary | ICD-10-CM | POA: Diagnosis not present

## 2021-02-04 DIAGNOSIS — I083 Combined rheumatic disorders of mitral, aortic and tricuspid valves: Secondary | ICD-10-CM | POA: Diagnosis present

## 2021-02-04 DIAGNOSIS — I7143 Infrarenal abdominal aortic aneurysm, without rupture: Secondary | ICD-10-CM | POA: Diagnosis present

## 2021-02-04 DIAGNOSIS — K8051 Calculus of bile duct without cholangitis or cholecystitis with obstruction: Secondary | ICD-10-CM

## 2021-02-04 DIAGNOSIS — K8031 Calculus of bile duct with cholangitis, unspecified, with obstruction: Principal | ICD-10-CM | POA: Diagnosis present

## 2021-02-04 DIAGNOSIS — R7401 Elevation of levels of liver transaminase levels: Secondary | ICD-10-CM | POA: Diagnosis not present

## 2021-02-04 DIAGNOSIS — Z950 Presence of cardiac pacemaker: Secondary | ICD-10-CM

## 2021-02-04 DIAGNOSIS — Z6822 Body mass index (BMI) 22.0-22.9, adult: Secondary | ICD-10-CM

## 2021-02-04 DIAGNOSIS — R0902 Hypoxemia: Secondary | ICD-10-CM | POA: Diagnosis not present

## 2021-02-04 DIAGNOSIS — K5900 Constipation, unspecified: Secondary | ICD-10-CM | POA: Diagnosis not present

## 2021-02-04 DIAGNOSIS — R54 Age-related physical debility: Secondary | ICD-10-CM | POA: Diagnosis present

## 2021-02-04 DIAGNOSIS — N179 Acute kidney failure, unspecified: Secondary | ICD-10-CM | POA: Diagnosis present

## 2021-02-04 DIAGNOSIS — R945 Abnormal results of liver function studies: Secondary | ICD-10-CM | POA: Diagnosis not present

## 2021-02-04 DIAGNOSIS — I517 Cardiomegaly: Secondary | ICD-10-CM | POA: Diagnosis not present

## 2021-02-04 DIAGNOSIS — K7689 Other specified diseases of liver: Secondary | ICD-10-CM | POA: Diagnosis present

## 2021-02-04 DIAGNOSIS — J449 Chronic obstructive pulmonary disease, unspecified: Secondary | ICD-10-CM | POA: Diagnosis present

## 2021-02-04 DIAGNOSIS — R1111 Vomiting without nausea: Secondary | ICD-10-CM | POA: Diagnosis not present

## 2021-02-04 DIAGNOSIS — I7789 Other specified disorders of arteries and arterioles: Secondary | ICD-10-CM | POA: Diagnosis not present

## 2021-02-04 DIAGNOSIS — R630 Anorexia: Secondary | ICD-10-CM | POA: Diagnosis present

## 2021-02-04 DIAGNOSIS — K5730 Diverticulosis of large intestine without perforation or abscess without bleeding: Secondary | ICD-10-CM | POA: Diagnosis not present

## 2021-02-04 DIAGNOSIS — Z96652 Presence of left artificial knee joint: Secondary | ICD-10-CM | POA: Diagnosis present

## 2021-02-04 DIAGNOSIS — J9811 Atelectasis: Secondary | ICD-10-CM | POA: Diagnosis not present

## 2021-02-04 DIAGNOSIS — Z85828 Personal history of other malignant neoplasm of skin: Secondary | ICD-10-CM

## 2021-02-04 DIAGNOSIS — Z9842 Cataract extraction status, left eye: Secondary | ICD-10-CM

## 2021-02-04 DIAGNOSIS — R109 Unspecified abdominal pain: Secondary | ICD-10-CM | POA: Diagnosis present

## 2021-02-04 DIAGNOSIS — I4821 Permanent atrial fibrillation: Secondary | ICD-10-CM | POA: Diagnosis present

## 2021-02-04 DIAGNOSIS — I713 Abdominal aortic aneurysm, ruptured, unspecified: Secondary | ICD-10-CM | POA: Diagnosis present

## 2021-02-04 DIAGNOSIS — Z9841 Cataract extraction status, right eye: Secondary | ICD-10-CM

## 2021-02-04 DIAGNOSIS — K5750 Diverticulosis of both small and large intestine without perforation or abscess without bleeding: Secondary | ICD-10-CM | POA: Diagnosis present

## 2021-02-04 DIAGNOSIS — Z888 Allergy status to other drugs, medicaments and biological substances status: Secondary | ICD-10-CM

## 2021-02-04 DIAGNOSIS — R112 Nausea with vomiting, unspecified: Secondary | ICD-10-CM | POA: Diagnosis not present

## 2021-02-04 DIAGNOSIS — I272 Pulmonary hypertension, unspecified: Secondary | ICD-10-CM | POA: Diagnosis present

## 2021-02-04 DIAGNOSIS — N1831 Chronic kidney disease, stage 3a: Secondary | ICD-10-CM | POA: Diagnosis present

## 2021-02-04 DIAGNOSIS — Z7982 Long term (current) use of aspirin: Secondary | ICD-10-CM

## 2021-02-04 DIAGNOSIS — R9431 Abnormal electrocardiogram [ECG] [EKG]: Secondary | ICD-10-CM | POA: Diagnosis not present

## 2021-02-04 DIAGNOSIS — Z66 Do not resuscitate: Secondary | ICD-10-CM | POA: Diagnosis not present

## 2021-02-04 DIAGNOSIS — Z8719 Personal history of other diseases of the digestive system: Secondary | ICD-10-CM

## 2021-02-04 DIAGNOSIS — Z87891 Personal history of nicotine dependence: Secondary | ICD-10-CM

## 2021-02-04 DIAGNOSIS — R110 Nausea: Secondary | ICD-10-CM | POA: Diagnosis not present

## 2021-02-04 DIAGNOSIS — I10 Essential (primary) hypertension: Secondary | ICD-10-CM | POA: Diagnosis present

## 2021-02-04 DIAGNOSIS — I442 Atrioventricular block, complete: Secondary | ICD-10-CM | POA: Diagnosis not present

## 2021-02-04 DIAGNOSIS — K8309 Other cholangitis: Secondary | ICD-10-CM

## 2021-02-04 DIAGNOSIS — E785 Hyperlipidemia, unspecified: Secondary | ICD-10-CM | POA: Diagnosis present

## 2021-02-04 DIAGNOSIS — Z8249 Family history of ischemic heart disease and other diseases of the circulatory system: Secondary | ICD-10-CM

## 2021-02-04 DIAGNOSIS — K625 Hemorrhage of anus and rectum: Secondary | ICD-10-CM | POA: Diagnosis not present

## 2021-02-04 DIAGNOSIS — I5032 Chronic diastolic (congestive) heart failure: Secondary | ICD-10-CM | POA: Diagnosis present

## 2021-02-04 DIAGNOSIS — D179 Benign lipomatous neoplasm, unspecified: Secondary | ICD-10-CM | POA: Diagnosis not present

## 2021-02-04 DIAGNOSIS — R011 Cardiac murmur, unspecified: Secondary | ICD-10-CM | POA: Diagnosis present

## 2021-02-04 DIAGNOSIS — Z7722 Contact with and (suspected) exposure to environmental tobacco smoke (acute) (chronic): Secondary | ICD-10-CM | POA: Diagnosis present

## 2021-02-04 DIAGNOSIS — R7989 Other specified abnormal findings of blood chemistry: Secondary | ICD-10-CM

## 2021-02-04 DIAGNOSIS — I959 Hypotension, unspecified: Secondary | ICD-10-CM | POA: Diagnosis not present

## 2021-02-04 LAB — COMPREHENSIVE METABOLIC PANEL
ALT: 291 U/L — ABNORMAL HIGH (ref 0–44)
AST: 763 U/L — ABNORMAL HIGH (ref 15–41)
Albumin: 3.6 g/dL (ref 3.5–5.0)
Alkaline Phosphatase: 229 U/L — ABNORMAL HIGH (ref 38–126)
Anion gap: 11 (ref 5–15)
BUN: 21 mg/dL (ref 8–23)
CO2: 25 mmol/L (ref 22–32)
Calcium: 9 mg/dL (ref 8.9–10.3)
Chloride: 100 mmol/L (ref 98–111)
Creatinine, Ser: 1.05 mg/dL — ABNORMAL HIGH (ref 0.44–1.00)
GFR, Estimated: 50 mL/min — ABNORMAL LOW (ref >60)
Glucose, Bld: 109 mg/dL — ABNORMAL HIGH (ref 70–99)
Potassium: 4 mmol/L (ref 3.5–5.1)
Sodium: 136 mmol/L (ref 135–145)
Total Bilirubin: 3.3 mg/dL — ABNORMAL HIGH (ref 0.3–1.2)
Total Protein: 6.2 g/dL — ABNORMAL LOW (ref 6.5–8.1)

## 2021-02-04 LAB — RESP PANEL BY RT-PCR (FLU A&B, COVID) ARPGX2
Influenza A by PCR: NEGATIVE
Influenza B by PCR: NEGATIVE
SARS Coronavirus 2 by RT PCR: NEGATIVE

## 2021-02-04 LAB — CBC WITH DIFFERENTIAL/PLATELET
Abs Immature Granulocytes: 0.02 10*3/uL (ref 0.00–0.07)
Basophils Absolute: 0 10*3/uL (ref 0.0–0.1)
Basophils Relative: 0 %
Eosinophils Absolute: 0 10*3/uL (ref 0.0–0.5)
Eosinophils Relative: 0 %
HCT: 45.8 % (ref 36.0–46.0)
Hemoglobin: 15 g/dL (ref 12.0–15.0)
Immature Granulocytes: 0 %
Lymphocytes Relative: 6 %
Lymphs Abs: 0.5 10*3/uL — ABNORMAL LOW (ref 0.7–4.0)
MCH: 32.7 pg (ref 26.0–34.0)
MCHC: 32.8 g/dL (ref 30.0–36.0)
MCV: 99.8 fL (ref 80.0–100.0)
Monocytes Absolute: 0.2 10*3/uL (ref 0.1–1.0)
Monocytes Relative: 2 %
Neutro Abs: 7.1 10*3/uL (ref 1.7–7.7)
Neutrophils Relative %: 92 %
Platelets: 146 10*3/uL — ABNORMAL LOW (ref 150–400)
RBC: 4.59 MIL/uL (ref 3.87–5.11)
RDW: 13.5 % (ref 11.5–15.5)
WBC: 7.9 10*3/uL (ref 4.0–10.5)
nRBC: 0 % (ref 0.0–0.2)

## 2021-02-04 LAB — LIPASE, BLOOD: Lipase: 42 U/L (ref 11–51)

## 2021-02-04 MED ORDER — ONDANSETRON 4 MG PO TBDP
4.0000 mg | ORAL_TABLET | Freq: Once | ORAL | Status: DC
  Filled 2021-02-04: qty 1

## 2021-02-04 MED ORDER — MORPHINE SULFATE (PF) 2 MG/ML IV SOLN
2.0000 mg | Freq: Once | INTRAVENOUS | Status: AC
  Administered 2021-02-04: 2 mg via INTRAVENOUS
  Filled 2021-02-04: qty 1

## 2021-02-04 MED ORDER — MORPHINE SULFATE (PF) 2 MG/ML IV SOLN
2.0000 mg | Freq: Once | INTRAVENOUS | Status: AC
Start: 2021-02-04 — End: 2021-02-04
  Administered 2021-02-04: 2 mg via INTRAVENOUS
  Filled 2021-02-04: qty 1

## 2021-02-04 MED ORDER — MORPHINE SULFATE (PF) 2 MG/ML IV SOLN
1.0000 mg | INTRAVENOUS | Status: DC | PRN
  Administered 2021-02-05 (×2): 1 mg via INTRAVENOUS
  Filled 2021-02-04 (×2): qty 1

## 2021-02-04 MED ORDER — ONDANSETRON HCL 4 MG/2ML IJ SOLN: 4.0000 mg | Freq: Once | INTRAMUSCULAR | Status: AC

## 2021-02-04 MED ORDER — IOHEXOL 350 MG/ML SOLN
80.0000 mL | Freq: Once | INTRAVENOUS | Status: AC | PRN
  Administered 2021-02-04: 80 mL via INTRAVENOUS

## 2021-02-04 MED ORDER — PROCHLORPERAZINE EDISYLATE 10 MG/2ML IJ SOLN
5.0000 mg | Freq: Four times a day (QID) | INTRAMUSCULAR | Status: DC | PRN
  Filled 2021-02-04: qty 1

## 2021-02-04 MED ORDER — ONDANSETRON HCL 4 MG/2ML IJ SOLN
INTRAMUSCULAR | Status: AC
  Administered 2021-02-04: 4 mg via INTRAVENOUS
  Filled 2021-02-04: qty 2

## 2021-02-04 NOTE — ED Notes (Signed)
Patient transported to Ultrasound 

## 2021-02-04 NOTE — ED Notes (Addendum)
error 

## 2021-02-04 NOTE — ED Provider Notes (Signed)
This patient is a 87 year old female, she has a prior history of an infrarenal abdominal aortic aneurysm that measured 5.8 cm as of March of this year.  The patient reports that she has been having some chest and abdominal pain today with some nausea and vomiting.  On my exam the patient appears very uncomfortable, bent over, vital signs are normal however she does have a pacemaker, her blood pressure is 126/77, she has some mid abdominal tenderness but a nonperitoneal abdomen.  At this time the patient will need to have a CT scan of the abdomen and pelvis, angiogram preferred to make sure that there is no rupturing AAA or other cause of her abdominal pain of significant or surgical significance.  I called CT scan to try to expedite the process, they are waiting on lab work but at this time I think it is reasonable to send her without having her known renal function to make sure things okay.  Her last renal function as of 1 month ago was normal.  This patient has an enlarging AAA, she is critically ill with a potentially life-threatening illness, I discussed the case with Dr. Donzetta Matters of the vascular surgery service who is happy to see the patient tomorrow, this patient does not have a rupturing aneurysm at this time.  The hospitalist will admit.  The patient declines surgery at this time, the family is interested in having a consultation but the the patient is of sound mind and has medical decision-making capacity and still is adamant that she does not want have surgery at this time  .Critical Care Performed by: Noemi Chapel, MD Authorized by: Noemi Chapel, MD   Critical care provider statement:    Critical care time (minutes):  35   Critical care time was exclusive of:  Separately billable procedures and treating other patients and teaching time   Critical care was necessary to treat or prevent imminent or life-threatening deterioration of the following conditions: AAA.   Critical care was time spent  personally by me on the following activities:  Blood draw for specimens, development of treatment plan with patient or surrogate, discussions with consultants, evaluation of patient's response to treatment, examination of patient, obtaining history from patient or surrogate, ordering and performing treatments and interventions, ordering and review of laboratory studies, ordering and review of radiographic studies, pulse oximetry, re-evaluation of patient's condition and review of old charts   Medical screening examination/treatment/procedure(s) were conducted as a shared visit with non-physician practitioner(s) and myself.  I personally evaluated the patient during the encounter.  Clinical Impression:   Final diagnoses:  Transaminitis  Generalized abdominal pain  AAA (abdominal aortic aneurysm) without rupture (HCC)         Noemi Chapel, MD 02/06/21 1252

## 2021-02-04 NOTE — ED Notes (Signed)
To CT

## 2021-02-04 NOTE — ED Triage Notes (Signed)
BIB [facility] from home for abd pain, and NV. NV developed enroute, bile-like. Unsure when abd pain started. Lives alone. Here from home/house. Pinpoints pain to epigastric and RUQ. EMS found pt doubled over in pain. NSL PTA. Fentanyl and zofran given with relief. H/o cholecystectomy. Alert, NAD, calm, interactive.
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
## 2021-02-04 NOTE — ED Notes (Signed)
Back from xray. Pain and nausea increased.
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
## 2021-02-04 NOTE — ED Notes (Signed)
Christian PA at bedside.
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
## 2021-02-04 NOTE — ED Notes (Signed)
ED PA at [facility] 

## 2021-02-05 ENCOUNTER — Other Ambulatory Visit: Payer: Self-pay

## 2021-02-05 DIAGNOSIS — Z8719 Personal history of other diseases of the digestive system: Secondary | ICD-10-CM | POA: Diagnosis not present

## 2021-02-05 DIAGNOSIS — R638 Other symptoms and signs concerning food and fluid intake: Secondary | ICD-10-CM | POA: Diagnosis not present

## 2021-02-05 DIAGNOSIS — J309 Allergic rhinitis, unspecified: Secondary | ICD-10-CM | POA: Diagnosis present

## 2021-02-05 DIAGNOSIS — D1771 Benign lipomatous neoplasm of kidney: Secondary | ICD-10-CM | POA: Diagnosis present

## 2021-02-05 DIAGNOSIS — R7401 Elevation of levels of liver transaminase levels: Secondary | ICD-10-CM | POA: Insufficient documentation

## 2021-02-05 DIAGNOSIS — K7689 Other specified diseases of liver: Secondary | ICD-10-CM | POA: Diagnosis not present

## 2021-02-05 DIAGNOSIS — K625 Hemorrhage of anus and rectum: Secondary | ICD-10-CM | POA: Diagnosis not present

## 2021-02-05 DIAGNOSIS — I7143 Infrarenal abdominal aortic aneurysm, without rupture: Secondary | ICD-10-CM | POA: Diagnosis present

## 2021-02-05 DIAGNOSIS — R7989 Other specified abnormal findings of blood chemistry: Secondary | ICD-10-CM | POA: Insufficient documentation

## 2021-02-05 DIAGNOSIS — J449 Chronic obstructive pulmonary disease, unspecified: Secondary | ICD-10-CM | POA: Diagnosis not present

## 2021-02-05 DIAGNOSIS — R1084 Generalized abdominal pain: Secondary | ICD-10-CM | POA: Diagnosis not present

## 2021-02-05 DIAGNOSIS — N1831 Chronic kidney disease, stage 3a: Secondary | ICD-10-CM | POA: Diagnosis present

## 2021-02-05 DIAGNOSIS — J301 Allergic rhinitis due to pollen: Secondary | ICD-10-CM | POA: Diagnosis not present

## 2021-02-05 DIAGNOSIS — N179 Acute kidney failure, unspecified: Secondary | ICD-10-CM | POA: Diagnosis present

## 2021-02-05 DIAGNOSIS — Z711 Person with feared health complaint in whom no diagnosis is made: Secondary | ICD-10-CM

## 2021-02-05 DIAGNOSIS — K8031 Calculus of bile duct with cholangitis, unspecified, with obstruction: Secondary | ICD-10-CM | POA: Diagnosis not present

## 2021-02-05 DIAGNOSIS — I4821 Permanent atrial fibrillation: Secondary | ICD-10-CM | POA: Diagnosis present

## 2021-02-05 DIAGNOSIS — I2510 Atherosclerotic heart disease of native coronary artery without angina pectoris: Secondary | ICD-10-CM | POA: Diagnosis present

## 2021-02-05 DIAGNOSIS — R011 Cardiac murmur, unspecified: Secondary | ICD-10-CM | POA: Diagnosis present

## 2021-02-05 DIAGNOSIS — Z7189 Other specified counseling: Secondary | ICD-10-CM | POA: Diagnosis not present

## 2021-02-05 DIAGNOSIS — R748 Abnormal levels of other serum enzymes: Secondary | ICD-10-CM | POA: Diagnosis not present

## 2021-02-05 DIAGNOSIS — I10 Essential (primary) hypertension: Secondary | ICD-10-CM | POA: Diagnosis not present

## 2021-02-05 DIAGNOSIS — I272 Pulmonary hypertension, unspecified: Secondary | ICD-10-CM | POA: Diagnosis not present

## 2021-02-05 DIAGNOSIS — K219 Gastro-esophageal reflux disease without esophagitis: Secondary | ICD-10-CM | POA: Diagnosis present

## 2021-02-05 DIAGNOSIS — E785 Hyperlipidemia, unspecified: Secondary | ICD-10-CM | POA: Diagnosis present

## 2021-02-05 DIAGNOSIS — K8080 Other cholelithiasis without obstruction: Secondary | ICD-10-CM | POA: Diagnosis not present

## 2021-02-05 DIAGNOSIS — E7800 Pure hypercholesterolemia, unspecified: Secondary | ICD-10-CM | POA: Diagnosis not present

## 2021-02-05 DIAGNOSIS — I5032 Chronic diastolic (congestive) heart failure: Secondary | ICD-10-CM | POA: Diagnosis not present

## 2021-02-05 DIAGNOSIS — I442 Atrioventricular block, complete: Secondary | ICD-10-CM | POA: Diagnosis not present

## 2021-02-05 DIAGNOSIS — Z66 Do not resuscitate: Secondary | ICD-10-CM

## 2021-02-05 DIAGNOSIS — K8050 Calculus of bile duct without cholangitis or cholecystitis without obstruction: Secondary | ICD-10-CM | POA: Diagnosis not present

## 2021-02-05 DIAGNOSIS — G9341 Metabolic encephalopathy: Secondary | ICD-10-CM | POA: Diagnosis not present

## 2021-02-05 DIAGNOSIS — R109 Unspecified abdominal pain: Secondary | ICD-10-CM | POA: Diagnosis present

## 2021-02-05 DIAGNOSIS — K8309 Other cholangitis: Secondary | ICD-10-CM | POA: Diagnosis not present

## 2021-02-05 DIAGNOSIS — Z20822 Contact with and (suspected) exposure to covid-19: Secondary | ICD-10-CM | POA: Diagnosis not present

## 2021-02-05 DIAGNOSIS — E875 Hyperkalemia: Secondary | ICD-10-CM | POA: Diagnosis not present

## 2021-02-05 DIAGNOSIS — Z789 Other specified health status: Secondary | ICD-10-CM

## 2021-02-05 DIAGNOSIS — I713 Abdominal aortic aneurysm, ruptured, unspecified: Secondary | ICD-10-CM | POA: Diagnosis present

## 2021-02-05 DIAGNOSIS — I083 Combined rheumatic disorders of mitral, aortic and tricuspid valves: Secondary | ICD-10-CM | POA: Diagnosis not present

## 2021-02-05 DIAGNOSIS — Z515 Encounter for palliative care: Secondary | ICD-10-CM | POA: Diagnosis not present

## 2021-02-05 DIAGNOSIS — I130 Hypertensive heart and chronic kidney disease with heart failure and stage 1 through stage 4 chronic kidney disease, or unspecified chronic kidney disease: Secondary | ICD-10-CM | POA: Diagnosis not present

## 2021-02-05 DIAGNOSIS — I714 Abdominal aortic aneurysm, without rupture, unspecified: Secondary | ICD-10-CM | POA: Diagnosis present

## 2021-02-05 DIAGNOSIS — K8033 Calculus of bile duct with acute cholangitis with obstruction: Secondary | ICD-10-CM | POA: Diagnosis not present

## 2021-02-05 LAB — URINALYSIS, ROUTINE W REFLEX MICROSCOPIC
Bilirubin Urine: NEGATIVE
Glucose, UA: NEGATIVE mg/dL
Hgb urine dipstick: NEGATIVE
Ketones, ur: NEGATIVE mg/dL
Leukocytes,Ua: NEGATIVE
Nitrite: NEGATIVE
Protein, ur: NEGATIVE mg/dL
Specific Gravity, Urine: >1.046 — ABNORMAL HIGH (ref 1.005–1.030)
pH: 5 (ref 5.0–8.0)

## 2021-02-05 LAB — COMPREHENSIVE METABOLIC PANEL
ALT: 457 U/L — ABNORMAL HIGH (ref 0–44)
AST: 703 U/L — ABNORMAL HIGH (ref 15–41)
Albumin: 3.8 g/dL (ref 3.5–5.0)
Alkaline Phosphatase: 343 U/L — ABNORMAL HIGH (ref 38–126)
Anion gap: 17 — ABNORMAL HIGH (ref 5–15)
BUN: 25 mg/dL — ABNORMAL HIGH (ref 8–23)
CO2: 23 mmol/L (ref 22–32)
Calcium: 9.6 mg/dL (ref 8.9–10.3)
Chloride: 98 mmol/L (ref 98–111)
Creatinine, Ser: 1.58 mg/dL — ABNORMAL HIGH (ref 0.44–1.00)
GFR, Estimated: 30 mL/min — ABNORMAL LOW (ref >60)
Glucose, Bld: 89 mg/dL (ref 70–99)
Potassium: 3.8 mmol/L (ref 3.5–5.1)
Sodium: 138 mmol/L (ref 135–145)
Total Bilirubin: 7.5 mg/dL — ABNORMAL HIGH (ref 0.3–1.2)
Total Protein: 7 g/dL (ref 6.5–8.1)

## 2021-02-05 LAB — HEPATITIS PANEL, ACUTE
HCV Ab: NONREACTIVE
Hep A IgM: NONREACTIVE
Hep B C IgM: NONREACTIVE
Hepatitis B Surface Ag: NONREACTIVE

## 2021-02-05 LAB — PROTIME-INR
INR: 1.4 — ABNORMAL HIGH (ref 0.8–1.2)
Prothrombin Time: 16.7 seconds — ABNORMAL HIGH (ref 11.4–15.2)

## 2021-02-05 LAB — ACETAMINOPHEN LEVEL: Acetaminophen (Tylenol), Serum: 14 ug/mL (ref 10–30)

## 2021-02-05 LAB — MRSA NEXT GEN BY PCR, NASAL: MRSA by PCR Next Gen: NOT DETECTED

## 2021-02-05 LAB — TROPONIN I (HIGH SENSITIVITY): Troponin I (High Sensitivity): 23 ng/L — ABNORMAL HIGH (ref <18)

## 2021-02-05 LAB — APTT: aPTT: 33 seconds (ref 24–36)

## 2021-02-05 MED ORDER — SODIUM CHLORIDE 0.9 % IV SOLN: INTRAVENOUS | Status: DC

## 2021-02-05 MED ORDER — FAMOTIDINE 20 MG IN NS 100 ML IVPB
20.0000 mg | INTRAVENOUS | Status: DC
  Administered 2021-02-05 – 2021-02-08 (×4): 20 mg via INTRAVENOUS
  Filled 2021-02-05 (×5): qty 100

## 2021-02-05 MED ORDER — INDOMETHACIN 50 MG RE SUPP
100.0000 mg | Freq: Once | RECTAL | Status: DC
  Filled 2021-02-05: qty 2

## 2021-02-05 MED ORDER — OXYCODONE HCL 5 MG PO TABS
5.0000 mg | ORAL_TABLET | Freq: Four times a day (QID) | ORAL | Status: DC | PRN
  Administered 2021-02-05 – 2021-02-08 (×2): 5 mg via ORAL
  Filled 2021-02-05 (×3): qty 1

## 2021-02-05 MED ORDER — SODIUM CHLORIDE 0.9 % IV SOLN
1.5000 g | Freq: Two times a day (BID) | INTRAVENOUS | Status: DC
  Administered 2021-02-05 – 2021-02-10 (×11): 1.5 g via INTRAVENOUS
  Filled 2021-02-05 (×16): qty 4

## 2021-02-05 MED ORDER — LACTATED RINGERS IV SOLN: INTRAVENOUS | Status: AC

## 2021-02-05 NOTE — Consult Note (Addendum)
Consultation Note Date: 02/05/2021   Patient Name: [name]  DOB: 03/03/1926  MRN: [id number]  Age / Sex: 87 y.o., female  PCP: Merrilee Seashore, MD Referring Physician: Antonieta Pert, MD  Reason for Consultation: Establishing goals of care  HPI/Patient Profile: Taken from H&P: 87 y.o. 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 fib on Pradaxa, complete heart block status post PPM, CAD, chronic diastolic CHF, COPD, moderate aortic insufficiency, hyperlipidemia, hypertension, bilateral renal artery stenosis.  Per chart review, patient's infrarenal abdominal aortic aneurysm has been steadily increasing in size.  Followed by Dr. Donzetta Matters from vascular surgery.  Per last vascular surgery office visit note from 08/18/2020 the aneurysm was 5.8 cm in size and was amenable to endovascular repair but patient has continued to decline surgical intervention.  Per note, patient also has small bilateral renal lesions which are concerning for renal cell carcinoma but decision was made not to pursue further evaluation.   Patient presented to the [facility] ED on 02/04/21 from home with complaints of chest and abdominal pain. She was admitted on 02/04/2021 with enlarging infrarenal abdominal aortic aneurism and elevated liver enzymes.   ED Course:  Vital signs stable.  Labs notable for normal hemoglobin.  Platelet count 146k (chronically low and stable).  Creatinine 1.0, stable.  Elevated liver enzymes (AST 763, ALT 291, alk phos 229, T bili 3.3).  Transaminitis significantly worse compared to labs done 2 years ago.  Lipase normal.  UA pending.  Chest x-ray showing mild bibasilar atelectasis, slightly more prominent on the left.  CTA chest/abdomen/pelvis showing increase in size of infrarenal abdominal aortic aneurysm from 5.8 cm to 6.2 cm; no  findings to suggest imminent rupture.  Stable 4.2 cm ascending thoracic aortic aneurysm.  Stable indeterminate bilateral 1.1 cm renal lesions.  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   Clinical Assessment and Goals of Care: I have reviewed medical records including EPIC notes, labs, and imaging. Received report from primary RN - no acute concerns. Patient is HOH.  Went to visit patient at bedside - no family/visitors present. Patient was lying in bed asleep - she does wake to voice/gentle touch. She is oriented to name and place only, disoriented to time and situation. She is likely not able to make complex medical decisions for herself at this time. No signs or non-verbal gestures of pain or discomfort noted. No respiratory distress, increased work of breathing, or secretions noted. Pateint does endorse 6/10 pain in her upper abdomen. She is clear again she would not want surgical intervention for AAA. Attempted to discuss EUS and ERCP - she was not able to grasp concept of procedure. At the end of meeting she stated, "I'm going to sleep." She is on acute 4L O2 [place].  2:52 PM Called son/Jeff to discuss diagnosis, prognosis, GOC, EOL wishes, disposition, and options. Asked if any other family would want to be a part of conversation as it was noted patient also has  two daughters. Asked about HCPOA. Merry Proud requests to call his sisters to see if they would like to meet via phone or in person. He will call PMT back with decision.  3:00 PM Merry Proud would like to meet in person and stated he will arrive to hospital in 30 minutes.  3:30 PM Met Jeff at patient's bedside.   I introduced Palliative Medicine as specialized medical care for people living with serious illness. It focuses on providing relief from the symptoms and stress of a serious illness. The goal is to improve quality of life for both the patient and the family.  We discussed a brief life review of the patient as well as functional and nutritional  status. Patient is widowed - she has three sons and two step daughters. Prior to hospitalization, patient was living in a private residence alone. She was able to ambulate independently with a walker; however, was not able to perform ADLs. She had a home health aid Monday-Saturday from 8a-12p to assist with dressing/bathing along with family visiting her daily. Merry Proud stated patient has had a poor appetite recently. He has not noticed a specific decline in patient's mental or physical health besides what he called "normal aging."  We discussed patient's current illness and what it means in the larger context of patient's on-going co-morbidities. Education provided that CHF and COPD are progressive, non-curable diseases underlying the patient's current acute medical conditions. Merry Proud has a clear understanding of patient's acute medical situation. Natural disease trajectory and expectations at EOL were discussed. I attempted to elicit values and goals of care important to the patient. The difference between aggressive medical intervention and comfort care was considered in light of the patient's goals of care. Therapeutic listening provided as Merry Proud reviews the patient's medical events of the last year. He understands EUS and ERCP are considered high risk due to patient's age and multiple medical problems. Merry Proud states he has spoken with his siblings, and they do wish for patient to proceed with procedures tomorrow as planned - they are hopeful patient's pain can improve. Merry Proud wonders why, since patient is already undergoing anesthesia, can AAA not be repaired. We again discuss patient's clearly stated wishes not to have that procedure, as well as increased time that would take under anesthesia, increasing her already high risk for complications. After discussion, he does want to support patient's decision not to proceed with surgical intervention for AAA.   Advance directives and concepts specific to code status were  considered and discussed. Merry Proud states he is patient's HCPOA - requested copies of documents. Merry Proud does confirm patient's DNR/DNI status.   Merry Proud is open to continued [place] discussions pending clinical course. He understands patient may or may not recover from this acute event. If patient starts to decline, they do seem open to comfort measures at that time.   Chaplain services offered - Merry Proud declined.  Discussed with patient/family the importance of continued conversation with each other and the medical providers regarding overall plan of care and treatment options, ensuring decisions are within the context of the patient's values and GOCs.    Questions and concerns were addressed. The patient/family was encouraged to call with questions and/or concerns. PMT card was provided.  5:51 PM Received notification Merry Proud called PMT phone and left VM with request for call back as he had questions.   Returned Jeff's call. He asked questions around visitation tomorrow morning before patient's procedure at 0730, which is before visiting hours begin. Notified him I would speak  with primary RN to obtain pre-procedure visitation information.  Spoke with primary RN - she will call Merry Proud with visitation information and speak with him about signing consents.  Primary Decision Maker: HCPOA/son/Jeff Morton - requested copy of HCPOA document    SUMMARY OF RECOMMENDATIONS   Continue current medical interventions without surgical intervention for AAA Continue DNR/DNI as previously documented - copy was made of durable DNR form found in shadow chart - will be scanned into [employer]/ACP tab Family has opted to proceed with ERCP and EUS in hopes it can improve patient's pain. After procedure, family will make stepwise decisions based on patient's recovery Ongoing [place] discussions pending clinical course If able, obtain copy of HCPOA document from son/Jeff PMT will shadow for needs Tuesday 9/27 and follow up Wednesday  9/28  Code Status/Advance Care Planning: DNR  Palliative Prophylaxis:  Aspiration, Bowel Regimen, Delirium Protocol, Frequent Pain Assessment, Oral Care, and Turn Reposition  Additional Recommendations (Limitations, Scope, Preferences): Full Scope Treatment without surgical interventions  Psycho-social/Spiritual:  Desire for further Chaplaincy support:no Created space and opportunity for patient and family to express thoughts and feelings regarding patient's current medical situation.  Emotional support and therapeutic listening provided.  Prognosis:  Hard to determine at this time - pending recovery of procedures tomorrow as she is high risk of complications  Discharge Planning: To Be Determined      Primary Diagnoses: Present on Admission:  AAA (abdominal aortic aneurysm) (HCC)  CAD, moderate  Complete heart block (HCC)  HTN (hypertension)  Chest pain  Pain in the abdomen   I have reviewed the medical record, interviewed the patient and family, and examined the patient. The following aspects are pertinent.  Past Medical History:  Diagnosis Date   Abdominal aortic aneurysm ([place])    Abdominal pain    due to Sequential arterial mediolysis.    Allergic rhinitis    Aneurysm ([place])    reports having liver "aneurysms" for which she underwent coiling   Arthritis    Ascending aortic aneurysm ([place])    Blood transfusion    Cancer ([place])    myelonoma behind eye   Cardiac tamponade, recurrent episode, admitted 9/5, but now felt to be pericarditits 01/17/2012   Chronic atrial fibrillation (HCC)    Chronic diastolic CHF (congestive heart failure) ([place])    Complete heart block ([place]) 01/23/2016   Afib    COPD (chronic obstructive pulmonary disease) ([place])    Dissecting aneurysm of thoracic aorta, Stanford type B ([place]) 10/2017   Diverticulosis of colon (without mention of hemorrhage)    Dysfunction of eustachian tube    Fibromuscular dysplasia (HCC)    GERD (gastroesophageal  reflux disease)    Head injury, acute, with loss of consciousness ([place]) 1987    for 4 -5 days. Hit in head with a screw from a swing.   Heart murmur    Hiatal hernia    HTN (hypertension)    Hyperlipidemia    Mild aortic stenosis    Moderate aortic insufficiency    Moderate tricuspid regurgitation    Pacemaker    Persistent atrial fibrillation (HCC)    Personal history of colonic polyps    Tachycardia-bradycardia ([place])    a. s/p PPM.   Social History   Socioeconomic History   Marital status: Married    Spouse name: Not on file   Number of children: 4   Years of education: Not on file   Highest education level: Not on file  Occupational History   Occupation:  retired    Fish farm manager: RETIRED  Tobacco Use   Smoking status: Former    Packs/day: 1.00    Years: 25.00    Pack years: 25.00    Types: Cigarettes    Quit date: 06/10/1982    Years since quitting: 38.6   Smokeless tobacco: Never   Tobacco comments:    former smoker x 22+ years, positive for second-hand smoke exposure  Vaping Use   Vaping Use: Never used  Substance and Sexual Activity   Alcohol use: No    Alcohol/week: 0.0 standard drinks   Drug use: No   Sexual activity: Not on file  Other Topics Concern   Not on file  Social History Narrative   Occasionally exercises   Rarely drinks caffeine   4 children, all boys   Lives in [place].   Social Determinants of Health   Financial Resource Strain: Not on file  Food Insecurity: Not on file  Transportation Needs: Not on file  Physical Activity: Not on file  Stress: Not on file  Social Connections: Not on file   Family History  Problem Relation Age of Onset   Heart disease Mother    Heart failure Father    Prostate cancer Father    Aortic dissection Brother 3   Arthritis Sister    Atrial fibrillation Sister    Colon cancer Neg Hx    Anesthesia problems Neg Hx    Hypotension Neg Hx    Malignant hyperthermia Neg Hx    Pseudochol deficiency Neg Hx     Scheduled Meds:  famotidine (PEPCID) IV  20 mg Intravenous Q24H   Continuous Infusions:  ampicillin-sulbactam (UNASYN) IV     lactated ringers 50 mL/hr at 02/05/21 1048   PRN Meds:.oxyCODONE, prochlorperazine Medications Prior to Admission:  Prior to Admission medications   Medication Sig Start Date End Date Taking? Authorizing Provider  aspirin 81 MG tablet Take 1 tablet (81 mg total) by mouth daily. 10/27/17   Samuella Cota, MD  Calcium Carbonate-Vitamin D (CALCIUM + D PO) Take 1 tablet by mouth 2 (two) times daily.    [provider]  dabigatran (PRADAXA) 75 MG CAPS capsule TAKE 1 CAPSULE BY MOUTH EVERY 12 HOURS Patient taking differently: Take 75 mg by mouth every 12 (twelve) hours. 01/31/21   Croitoru, Mihai, MD  fluticasone (FLONASE) 50 MCG/ACT nasal spray Place 1 spray into both nostrils daily as needed for allergies or rhinitis. Patient not taking: Reported on 01/08/2021    [provider]  furosemide (LASIX) 40 MG tablet TAKE 1 TABLET(40 MG) BY MOUTH DAILY Patient taking differently: Take 40 mg by mouth daily. 06/13/20   Croitoru, Mihai, MD  isosorbide mononitrate (IMDUR) 30 MG 24 hr tablet Take 0.5 tablets (15 mg total) by mouth daily. 01/08/21   Croitoru, Mihai, MD  ketoconazole (NIZORAL) 2 % cream SMARTSIG:1 [place] Topical Daily 11/29/19   [provider]  loratadine (CLARITIN) 10 MG tablet Take 10 mg by mouth daily as needed for allergies.  Patient not taking: Reported on 01/08/2021    [provider]  magnesium oxide (MAG-OX) 400 (241.3 Mg) MG tablet Take 1 tablet (400 mg total) by mouth daily. 08/18/17   Croitoru, Mihai, MD  metoprolol succinate (TOPROL-XL) 50 MG 24 hr tablet TAKE 1 TABLET BY MOUTH TWICE DAILY. TAKE WITH OR IMMEDIATELY FOLLOWING A MEAL Patient taking differently: Take 50 mg by mouth 2 (two) times daily. 09/14/20   Croitoru, Mihai, MD  Multiple Vitamin (MULTIVITAMIN WITH MINERALS) TABS tablet  Take 1 tablet by mouth daily.     [provider]  Multiple Vitamins-Minerals (PRESERVISION AREDS 2) CAPS Take 1 capsule by mouth 2 (two) times daily.     [provider]  nitroGLYCERIN (NITROSTAT) 0.4 MG SL tablet Place 1 tablet (0.4 mg total) under the tongue every 5 (five) minutes as needed for chest pain. DO NOT TAKE MORE THAN 3 DOSES WITHIN 15 MINS 01/08/21   Croitoru, Mihai, MD  Omega-3 Fatty Acids (FISH OIL) 1000 MG CAPS Take 1,000 mg by mouth daily.    [provider]  Polyethyl Glycol-Propyl Glycol (SYSTANE OP) Place 2 drops into both eyes 2 (two) times daily as needed (for dry eyes).    [provider]  potassium chloride (K-DUR,KLOR-CON) 10 MEQ tablet Take 10 mEq by mouth daily.    [provider]  potassium chloride (KLOR-CON) 10 MEQ tablet TAKE 1 TABLET(10 MEQ) BY MOUTH DAILY Patient taking differently: Take 10 mEq by mouth daily. 12/08/20   Croitoru, Mihai, MD  rosuvastatin (CRESTOR) 5 MG tablet Take 1 tablet (5 mg total) by mouth every evening. 06/10/13   Croitoru, Mihai, MD   Allergies  Allergen Reactions   Tramadol Other (See Comments)    Medication made her feel "off and talk to herself"   Protonix [Pantoprazole Sodium] Other (See Comments)    Abdominal pain   Pantoprazole     Other reaction(s): Other (See Comments) Abdominal pain Abdominal pain   Review of Systems  Constitutional:  Positive for activity change, appetite change and fatigue.  Respiratory:  Negative for shortness of breath.   Gastrointestinal:  Negative for nausea and vomiting.  All other systems reviewed and are negative.  Physical Exam Vitals and nursing note reviewed.  Constitutional:      General: She is not in acute distress. Pulmonary:     Effort: No respiratory distress.  Skin:    General: Skin is warm and dry.  Neurological:     Mental Status: She is lethargic, disoriented and confused.     Motor: Weakness present.  Psychiatric:        Attention and Perception: Attention normal.         Behavior: Behavior is cooperative.        Cognition and Memory: Cognition is impaired. Memory is impaired.    Vital Signs: BP 104/79 (BP Location: Right Arm)   Pulse 70   Temp 97.7 F (36.5 C) (Oral)   Resp (!) 23   Wt 56.7 kg   SpO2 94%   BMI 22.86 kg/m  Pain Scale: PAINAD   Pain Score: 10-Worst pain ever   SpO2: SpO2: 94 % O2 Device:SpO2: 94 % O2 Flow Rate: .O2 Flow Rate (L/min): 4 L/min  IO: Intake/output summary:  Intake/Output Summary (Last 24 hours) at 02/05/2021 1412 Last data filed at 02/05/2021 1119 Gross per 24 hour  Intake 96.76 ml  Output --  Net 96.76 ml    LBM:   Baseline Weight: Weight: 56.7 kg Most recent weight: Weight: 56.7 kg     Palliative Assessment/Data: PPS 30-40%     Time In/Out: 1430-1500, 1530-1630, 1751-1801 Time Total: 100 minutes  Greater than 50%  of this time was spent counseling and coordinating care related to the above assessment and plan.  Signed by: Lin Landsman, NP   Please contact Palliative Medicine Team phone at [phone] for questions and concerns.  For individual provider: See Shea Evans

## 2021-02-05 NOTE — Progress Notes (Signed)
Pharmacy Antibiotic Note  [name] is a 87 y.o. female admitted on 02/04/2021 with  IAI .  Pharmacy has been consulted for Unasyn dosing.  Plan: Unasyn 1.5g IV q12h per renal function -Monitor renal function, clinical status, and antibiotic plan  Weight: 56.7 kg (125 lb)  Temp (24hrs), Avg:97.5 F (36.4 C), Min:97.4 F (36.3 C), Max:97.5 F (36.4 C)  Recent Labs  Lab 02/04/21 1714 02/05/21 0422  WBC 7.9  --   CREATININE 1.05* 1.58*    Estimated Creatinine Clearance: 17.6 mL/min (A) (by C-G formula based on SCr of 1.58 mg/dL (H)).    Allergies  Allergen Reactions   Tramadol Other (See Comments)    Medication made her feel "off and talk to herself"   Protonix [Pantoprazole Sodium] Other (See Comments)    Abdominal pain   Pantoprazole     Other reaction(s): Other (See Comments) Abdominal pain Abdominal pain   Antimicrobials this admission: Unasyn 9/26 >>  Thank you for allowing pharmacy to be a part of this patient's care.  Joetta Manners, PharmD, [facility] Emergency Medicine Clinical Pharmacist ED RPh Phone: [place]: [phone]

## 2021-02-05 NOTE — Consult Note (Addendum)
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  Patient will be n.p.o. at midnight 7.  Dr. Hilarie Fredrickson is going to readdress procedures with her and her son later this morning to confirm they are willing to proceed, hopefully she will be able to be more active in the conversation.  Thank you for your kind consultation, we will continue to follow.  Lavone Nian [facility]  02/05/2021, 10:51 AM

## 2021-02-05 NOTE — Anesthesia Preprocedure Evaluation (Addendum)
Anesthesia Evaluation  Patient identified by MRN, date of birth, ID band Patient awake    Reviewed: Allergy & Precautions, H&P , NPO status , Patient's Chart, lab work & pertinent test results  History of Anesthesia Complications Negative for: history of anesthetic complications  Airway Mallampati: I  TM Distance: >3 FB Neck ROM: Full    Dental  (+) Edentulous Upper, Partial Lower   Pulmonary COPD,  COPD inhaler, former smoker,    Pulmonary exam normal        Cardiovascular hypertension, Pt. on medications and Pt. on home beta blockers + CAD (non-obstructive by cath)  + dysrhythmias (pradaxa) Atrial Fibrillation + pacemaker + Valvular Problems/Murmurs AS, AI and MR  Rhythm:Irregular Rate:Normal + Systolic murmurs IMPRESSIONS    1. Left ventricular ejection fraction, by estimation, is 60 to 65%. The  left ventricle has normal function. The left ventricle has no regional  wall motion abnormalities. There is moderate left ventricular hypertrophy.  Left ventricular diastolic  parameters are indeterminate.  2. Right ventricular systolic function is mildly reduced. The right  ventricular size is moderately enlarged. There is mildly elevated  pulmonary artery systolic pressure. The estimated right ventricular  systolic pressure is 67.6 mmHg.  3. Left atrial size was severely dilated.  4. Right atrial size was moderately dilated.  5. The mitral valve is degenerative. Mild to moderate mitral valve  regurgitation. Mild mitral stenosis. The mean mitral valve gradient is 4.0  mmHg. Moderate to severe mitral annular calcification.  6. The tricuspid valve is abnormal. Tricuspid valve regurgitation is  severe.  7. The aortic valve is tricuspid. Aortic valve regurgitation is moderate.  Mild aortic valve stenosis. Aortic valve area, by VTI measures 2.04 cm.  Aortic valve mean gradient measures 12.0 mmHg.  8. Aortic dilatation  noted. There is mild dilatation of the ascending  aorta, measuring 41 mm.  9. The inferior vena cava is dilated in size with >50% respiratory  variability, suggesting right atrial pressure of 8 mmHg.  10. The patient was in atrial fibrillation.   Comparison(s): 1/720 EF 65-70%. Mild AI.  '12 Stress: normal perfusion and LVF, no ischemia '12 ECHO: EF 60-65%, valves OK   Neuro/Psych TIA   GI/Hepatic GERD  Medicated,Elevated LFTs   Endo/Other  negative endocrine ROS  Renal/GU negative Renal ROS     Musculoskeletal   Abdominal   Peds  Hematology negative hematology ROS (+)   Anesthesia Other Findings   Reproductive/Obstetrics                            Anesthesia Physical  Anesthesia Plan  ASA: 3  Anesthesia Plan: General   Post-op Pain Management:    Induction: Intravenous  PONV Risk Score and Plan: 2 and Treatment may vary due to age or medical condition, Ondansetron and Dexamethasone  Airway Management Planned: Oral ETT  Additional Equipment:   Intra-op Plan:   Post-operative Plan: Extubation in OR  Informed Consent: I have reviewed the patients History and Physical, chart, labs and discussed the procedure including the risks, benefits and alternatives for the proposed anesthesia with the patient or authorized representative who has indicated his/her understanding and acceptance.   Patient has DNR.  Discussed DNR with patient and Suspend DNR.   Dental advisory given and Consent reviewed with POA  Plan Discussed with: Anesthesiologist, CRNA and Surgeon  Anesthesia Plan Comments: (Discussed DNR with pt and son.  Will suspend for intubation, but not perform CPR  if needed.)      Anesthesia Quick Evaluation

## 2021-02-05 NOTE — Progress Notes (Signed)
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 fib on Pradaxa, complete heart block with PPM in place, CAD, chronic diastolic CHF, COPD, moderate aortic insufficiency, HLD, HTN, bilateral renal artery stenosis presents to the ED via EMS for evaluation of severe chest pain and abdominal pain nausea vomiting. She was seen in the ED underwent blood work up that showed  Elevated liver enzymes (AST 763, ALT 291, alk phos 229, T bili 3.3).  Transaminitis significantly worse compared to labs done 2 years ago.  Lipase normal, chest x-ray mild basilar atelectasis, CTA chest/abdomen/pelvis increase in infrarenal abdominal aortic aneurysm to 6.2 cm from 5.8 cm no finding to suggest imminent rupture , stable 4.2 cm ascending thoracic aortic aneurysm.  Vascular surgery were consulted and patient again declined any surgical intervention of her aneurysm. Patient was admitted for further management  Subjective: Seen this morning in the ED.  Was sleeping resting comfortably after getting IV morphine earlier in the morning appears mildly confused, was not in distress Complaining of abdominal pain  Assessment & Plan:  Infrarenal AAA increasing 6.2 cm from 5.8 cm: Again seen by vascular surgery and patient has declined surgical intervention, advised outpatient follow-up.  Consult palliative care  Abdominal pain/chest pain: CT chest abdomen pelvis showing enlarging AAA and thoracic aortic aneurysm suspect it may be giving her pain, otherwise no acute intra-abdominal finding besides multiple incidental finding as below, stomach/bowel within normal limits.  Wonder if it is due to her aneurysm.  Add PPI  clear liquid diet symptomatic management.  Symptomatic management pain control, palliative consult-with iv morphine she seems to be more confused, will try Oxy IR- dsicussed w/ son.  Abnormal LFTs/liver dysfunction: Unclear etiology, acute viral Panel negative, ultrasound of the liver shows postcholecystectomy status.  Repeat LFTs AST improving but ALT. ALP TB worsening worsening.  Will request GI evaluation.  INR 1.4 . Hold statins. Recent Labs  Lab 02/04/21 1714 02/05/21 0030 02/05/21 0422  AST 763*  --  703*  ALT 291*  --  457*  ALKPHOS 229*  --  343*  BILITOT 3.3*  --  7.5*  PROT 6.2*  --  7.0  ALBUMIN 3.6  --  3.8  INR  --  1.4*  --     Acute metabolic encephalopathy multifactorial in the setting of liver dysfunction, ongoing pain, due to pain medications.  Keep on supportive care, fall precaution.   AKI on CKD stage IIIa: Baseline creatinine 0.9-1.  Noted worsening in the setting of abdominal pain/decreased oral intake, iv iodine contrast use, add gentle IV fluids but watch for fluid overload due to diastolic CHF Recent Labs    02/04/21 1714 02/05/21 0422  BUN 21 25*  CREATININE 1.05* 1.58*   Chronic diastolic CHF euvolemic slightly on the dry side hold diuretics  CAD HTN HLD Troponin 29, monitor.EKG interpretation limited due to paced rhythm.  Med rec pending  Persistent A. Fib: History of failed ablation and antiarrhythmics with pacemaker in place, holding Pradaxa for now switch to lovenox pharmacy to dose (son agreeable).  Complete heart block s/p PPM  COPD no signs of exacerbation  Other incidental finding as below Stable thoracic ascending aortic aneurysm 4.2 cm-outpatient follow-up Cardiomegaly noted in the CT Pulmonary  hypertension noted in CT scan showing enlarged bilateral pulmonary arteries Indeterminate bilateral 1.1 cm renal lesions Stable 2.8 cm left angiomyolipoma Diffuse colonic diverticulosis without diverticulitis  Goals of care: DNR, overall  prognosis does not appear bright in the setting of her increasing AAA and has declined surgery and has  multiple comorbidities, advanced age, and ongoing pain issues.  I had extensive discussed with patient's son agree with palliative medicine consult, he feels that at this time "only option is to keep her comfortable".  [place] Pending  Diet Order             Diet clear liquid Room service appropriate? Yes; Fluid consistency: Thin  Diet effective now                  DVT prophylaxis:  Code Status:   Code Status: DNR  Family Communication: plan of care discussed with patient at bedside. Status is: Observation Patient remains hospitalized for due to ongoing management of AKI, liver dysfunction Dispo: The patient is from: Home              Anticipated d/c is to: Home              Patient currently is not medically stable to d/c.   Difficult to place patient No  Objective: Vitals: Today's Vitals   02/05/21 0645 02/05/21 0700 02/05/21 0730 02/05/21 0900  BP:  139/78 140/86 (!) 145/93  Pulse:  69 70 71  Resp:  19 (!) 27 16  Temp:      TempSrc:      SpO2:   92% 99%  Weight:      PainSc: Asleep      Examination:  General exam: AA but confused, weak,older than stated age HEENT:Oral mucosa moist, Ear/Nose WNL grossly,dentition normal. Respiratory system: bilaterally clear breath sounds, no use of accessory muscle, non tender. Cardiovascular system: S1 & S2 +,No JVD. Gastrointestinal system: Abdomen soft, mildly distended on mid abdomen, NT,BS+. Nervous System:Alert, awake, moving extremities Extremities: no edema, distal peripheral pulses palpable.  Skin: No rashes,no icterus. MSK: Normal muscle bulk,tone, power  No intake or output data in the 24 hours ending 02/05/21 1105 Filed Weights   02/04/21 1706  Weight: 56.7 kg   Weight change:    Consultants: GI VVS Palliative medicine  Procedures:see note Antimicrobials: Anti-infectives (From admission, onward)     None      Culture/Microbiology    Component Value Date/Time   SDES BLOOD RIGHT ANTECUBITAL 08/05/2015 1308   SPECREQUEST BOTTLES DRAWN AEROBIC AND ANAEROBIC 5 CC EACH 08/05/2015 1308   CULT  08/05/2015 1308    NO GROWTH 5 DAYS Performed at [place] 08/10/2015 FINAL 08/05/2015 1308    Other culture-see note  Unresulted Labs (From admission, onward)    None       Medications reviewed:  Scheduled Meds:  famotidine (PEPCID) IV  20 mg Intravenous Q24H   Continuous Infusions:  lactated ringers 50 mL/hr at 02/05/21 1048     Intake/Output from previous day: No intake/output data recorded. Intake/Output this shift: No intake/output data recorded. Filed Weights   02/04/21 1706  Weight: 56.7 kg   Data Reviewed: I have personally reviewed following labs and imaging studies CBC: Recent Labs  Lab 02/04/21 1714  WBC 7.9  NEUTROABS 7.1  HGB 15.0  HCT 45.8  MCV 99.8  PLT 793*   Basic Metabolic Panel: Recent Labs  Lab 02/04/21 1714 02/05/21 0422  NA 136 138  K 4.0 3.8  CL 100 98  CO2 25 23  GLUCOSE 109* 89  BUN 21 25*  CREATININE 1.05* 1.58*  CALCIUM 9.0 9.6   GFR: Estimated Creatinine Clearance: 17.6 mL/min (A) (by C-G formula based on SCr of 1.58 mg/dL (H)). Liver Function Tests: Recent Labs  Lab 02/04/21 1714 02/05/21 0422  AST 763* 703*  ALT 291* 457*  ALKPHOS 229* 343*  BILITOT 3.3* 7.5*  PROT 6.2* 7.0  ALBUMIN 3.6 3.8   Recent Labs  Lab 02/04/21 1714  LIPASE 42   No results for input(s): AMMONIA in the last 168 hours. Coagulation Profile: Recent Labs  Lab 02/05/21 0030  INR 1.4*   Cardiac Enzymes: No results for input(s): CKTOTAL, CKMB, CKMBINDEX, TROPONINI in the last 168 hours. BNP (last 3 results) No results for input(s): PROBNP in the last 8760 hours. HbA1C: No results for input(s): HGBA1C in the last 72 hours. CBG: No results for input(s): GLUCAP in the last 168 hours. Lipid Profile: No results for  input(s): CHOL, HDL, LDLCALC, TRIG, CHOLHDL, LDLDIRECT in the last 72 hours. Thyroid Function Tests: No results for input(s): TSH, T4TOTAL, FREET4, T3FREE, THYROIDAB in the last 72 hours. Anemia Panel: No results for input(s): VITAMINB12, FOLATE, FERRITIN, TIBC, IRON, RETICCTPCT in the last 72 hours. Sepsis Labs: No results for input(s): PROCALCITON, LATICACIDVEN in the last 168 hours.  Recent Results (from the past 240 hour(s))  Resp Panel by RT-PCR (Flu A&B, Covid) Nasopharyngeal Swab     Status: None   Collection Time: 02/04/21  9:40 PM   Specimen: Nasopharyngeal Swab; Nasopharyngeal(NP) swabs in vial transport medium  Result Value Ref Range Status   SARS Coronavirus 2 by RT PCR NEGATIVE NEGATIVE Final    Comment: (NOTE) SARS-CoV-2 target nucleic acids are NOT DETECTED.  The SARS-CoV-2 RNA is generally detectable in upper respiratory specimens during the acute phase of infection. The lowest concentration of SARS-CoV-2 viral copies this assay can detect is 138 copies/mL. A negative result does not preclude SARS-Cov-2 infection and should not be used as the sole basis for treatment or other patient management decisions. A negative result may occur with  improper specimen collection/handling, submission of specimen other than nasopharyngeal swab, presence of viral mutation(s) within the areas targeted by this assay, and inadequate number of viral copies(<138 copies/mL). A negative result must be combined with clinical observations, patient history, and epidemiological information. The expected result is Negative.  Fact Sheet for Patients:  EntrepreneurPulse.com.au  Fact Sheet for Healthcare Providers:  IncredibleEmployment.be  This test is no t yet approved or cleared by the [place] FDA and  has been authorized for detection and/or diagnosis of SARS-CoV-2 by FDA under an Emergency Use Authorization (EUA). This EUA will remain  in effect  (meaning this test can be used) for the duration of the COVID-19 declaration under Section 564(b)(1) of the Act, 21 U.S.C.section 360bbb-3(b)(1), unless the authorization is terminated  or revoked sooner.       Influenza A by PCR NEGATIVE NEGATIVE Final   Influenza B by PCR NEGATIVE NEGATIVE Final    Comment: (NOTE) The Xpert Xpress SARS-CoV-2/FLU/RSV plus assay is intended as an aid in the diagnosis of influenza from Nasopharyngeal swab specimens and should not be used as a sole basis for treatment. Nasal washings and aspirates are unacceptable for Xpert Xpress SARS-CoV-2/FLU/RSV testing.  Fact Sheet for Patients: EntrepreneurPulse.com.au  Fact Sheet for Healthcare Providers: IncredibleEmployment.be  This test is not yet approved or cleared by the [place] FDA and has  been authorized for detection and/or diagnosis of SARS-CoV-2 by FDA under an Emergency Use Authorization (EUA). This EUA will remain in effect (meaning this test can be used) for the duration of the COVID-19 declaration under Section 564(b)(1) of the Act, 21 U.S.C. section 360bbb-3(b)(1), unless the authorization is terminated or revoked.  Performed at [facility], [place] [address], [place], [place] [address]      Radiology Studies: DG Chest 2 View  Result Date: 02/04/2021 CLINICAL DATA:  Chest pain, nausea, vomiting EXAM: CHEST - 2 VIEW COMPARISON:  12/05/2017, 08/10/2020 FINDINGS: Left-sided implanted cardiac device remains stable in positioning. Mild cardiomegaly. Aortic atherosclerosis. Mild bibasilar atelectasis, slightly more prominent on the left. No pleural effusion. No pneumothorax. Advanced degenerative changes of the right shoulder. IMPRESSION: Mild bibasilar atelectasis, slightly more prominent on the left. Electronically Signed   By: Davina Poke D.O.   On: 02/04/2021 17:57   CT Angio Chest/Abd/Pel for Dissection W and/or Wo Contrast  Result Date:  02/04/2021 CLINICAL DATA:  Abdominal pain, aortic dissection suspected. concerned for dissection. EXAM: CT ANGIOGRAPHY CHEST, ABDOMEN AND PELVIS TECHNIQUE: Non-contrast CT of the chest was initially obtained. Multidetector CT imaging through the chest, abdomen and pelvis was performed using the standard protocol during bolus administration of intravenous contrast. Multiplanar reconstructed images and MIPs were obtained and reviewed to evaluate the vascular anatomy. CONTRAST:  49m OMNIPAQUE IOHEXOL 350 MG/ML SOLN COMPARISON:  CT angiography chest, abdomen, pelvis 08/10/2020 FINDINGS: CTA CHEST FINDINGS Lines and tubes: Cardiovascular: Preferential opacification of the thoracic aorta. Stable aneurysmal ascending thoracic aorta measuring up to 4.2 cm. The descending thoracic aorta is normal in caliber. No dissection identified. Severe atherosclerotic plaque. Four-vessel coronary artery calcifications. Enlarged heart. No pericardial effusion. The bilateral main pulmonary arteries is enlarged in caliber. No central or segmental pulmonary embolus. Mediastinum/Nodes: No enlarged mediastinal, hilar, or axillary lymph nodes. Thyroid gland, trachea, and esophagus demonstrate no significant findings. Lungs/Pleura: No focal consolidation. No pulmonary nodule. No pulmonary mass. No pleural effusion. No pneumothorax. Musculoskeletal: Review of the MIP images confirms the above findings. CTA ABDOMEN AND PELVIS FINDINGS VASCULAR Aorta: Severe atherosclerotic plaque. No suprarenal abdominal aorta aneurysm. Interval increase in size of an infrarenal abdominal aorta measuring up to 6.2 cm (from 5.8 cm). The aneurysm is again noted to extend approximately 7 cm in the craniocaudal dimension to the aortic bifurcation. Large thrombosed portion is again noted. No surrounding fat stranding or draping over the lumbar thoracic aorta. No dissection identified. Celiac: Atherosclerotic plaque. Patent without evidence of aneurysm, dissection,  vasculitis or significant stenosis. SMA: Atherosclerotic plaque. Patent without evidence of aneurysm, dissection, vasculitis or significant stenosis. Renals: Atherosclerotic plaque. Both renal arteries are patent without evidence of aneurysm, dissection, vasculitis, fibromuscular dysplasia or significant stenosis. IJOA:[id number]. Patent without evidence of aneurysm, dissection, vasculitis or significant stenosis. Inflow: Patent without evidence of aneurysm, dissection, vasculitis or significant stenosis. Veins: No obvious venous abnormality within the limitations of this arterial phase study. Review of the MIP images confirms the above findings. NON-VASCULAR Hepatobiliary: No focal liver abnormality. No gallstones, gallbladder wall thickening, or pericholecystic fluid. No biliary dilatation. Pancreas: No focal lesion. Normal pancreatic contour. No surrounding inflammatory changes. No main pancreatic ductal dilatation. Spleen: Normal in size without focal abnormality. Adrenals/Urinary Tract: No adrenal nodule bilaterally. Bilateral kidneys enhance symmetrically. Similar-appearing 2.8 cm heterogeneous fat containing left renal lesion likely representing an angiomyolipoma. Stable 1.1 cm left hypodense lesion with a density of 83 Hounsfield units. Subcentimeter hypodensities are too small to characterize. Stable 1.1  cm right inferior renal pole lesion with a density of 46 Hounsfield units. No hydronephrosis. No hydroureter. The urinary bladder is unremarkable. Stomach/Bowel: Stomach is within normal limits. No evidence of bowel wall thickening or dilatation. Diffuse colonic diverticulosis. Appendix appears normal. Lymphatic: No lymphadenopathy. Reproductive: The uterus and bilateral adnexal regions are unremarkable. Other: No intraperitoneal free fluid. No intraperitoneal free gas. No organized fluid collection. Musculoskeletal: No abdominal wall hernia or abnormality. No suspicious lytic or blastic osseous  lesions. No acute displaced fracture. Multilevel degenerative changes of the spine with a compression fracture status post kyphoplasty at the L3 level. Old healed pelvic fractures. Review of the MIP images confirms the above findings. IMPRESSION: 1. Interval increase in size of a known infrarenal abdominal aorta aneurysm (6.2 cm from 5.8 cm). No findings to suggest imminent rupture. Recommend referral to a vascular specialist. This recommendation follows ACR consensus guidelines: White Paper of the ACR Incidental Findings Committee II on Vascular Findings. J Am Coll Radiol 2013; 10:789-794. 2. Stable aneurysmal thoracic ascending aorta (4.2 cm). Recommend annual imaging followup by CTA or MRA. This recommendation follows 2010 ACCF/AHA/AATS/ACR/ASA/SCA/SCAI/SIR/STS/SVM Guidelines for the Diagnosis and Management of Patients with Thoracic Aortic Disease. Circulation. 2010; 121: W431-V400. Aortic aneurysm NOS (ICD10-I71.9) 3. Cardiomegaly. 4. Enlarged bilateral main pulmonary arteries suggestive of pulmonary hypertension. No central or segmental pulmonary embolus. 5. Aortic Atherosclerosis (ICD10-I70.0) including four-vessel coronary calcifications. 6. Other imaging findings of potential clinical significance: Stable indeterminate bilateral 1.1 cm renal lesions. 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 Diffuse colonic diverticulosis with no acute diverticulitis. These results were called by telephone at the time of interpretation on 02/04/2021 at 7:07 pm to provider Dr. Sabra Heck, who verbally acknowledged these results. Electronically Signed   By: Iven Finn M.D.   On: 02/04/2021 19:27   US Abdomen Limited RUQ (LIVER/GB)  Result Date: 02/04/2021 CLINICAL DATA:  Elevated liver enzymes. EXAM: ULTRASOUND ABDOMEN LIMITED RIGHT UPPER QUADRANT COMPARISON:  CT angiography abdomen pelvis 12/22/2019, right upper quadrant ultrasound 12/28/2016 FINDINGS: Gallbladder: Status post cholecystectomy. Biliary ducts: Intra and  extrahepatic biliary ductal dilatation. Common bile duct diameter: 13 mm (stable compared to prior). Liver: No focal lesion identified. Within normal limits in parenchymal echogenicity. Portal vein is patent on color Doppler imaging with normal direction of blood flow towards the liver. Other: None. IMPRESSION: Persistent intra and extrahepatic biliary ductal dilatation which can be seen in the post cholecystectomy setting. Electronically Signed   By: Iven Finn M.D.   On: 02/04/2021 23:00     LOS: 0 days   Antonieta Pert, MD [facility]  02/05/2021, 11:05 AM

## 2021-02-05 NOTE — H&P (View-Only) (Signed)
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 6.  Patient will be n.p.o. at midnight 7.  Dr. Hilarie Fredrickson is going to readdress procedures with her and her son later this morning to confirm they are willing to proceed, hopefully she will be able to be more active in the conversation.  Thank you for your kind consultation, we will continue to follow.  Lavone Nian [facility]  02/05/2021, 10:51 AM

## 2021-02-05 NOTE — ED Notes (Signed)
Pt resting in stretcher with eyes closed. Resp easy, shallow and regular, no distress noted, VSS. Pt has call light on lap. Will continue to monitor.

## 2021-02-05 NOTE — Progress Notes (Signed)
Patient coughing with sip of water, RN put in for swallow eval and made pt NPO.

## 2021-02-05 NOTE — Progress Notes (Signed)
   02/05/21 1900  Assess: MEWS Score  Temp 98 F (36.7 C)  BP (!) 79/65  Pulse Rate 72  ECG Heart Rate 72  Resp 15  SpO2 97 %  O2 Device Nasal Cannula  O2 Flow Rate (L/min) 4 L/min  Assess: MEWS Score  MEWS Temp 0  MEWS Systolic 2  MEWS Pulse 0  MEWS RR 0  MEWS LOC 0  MEWS Score 2  MEWS Score Color Yellow  Assess: if the MEWS score is Yellow or Red  Were vital signs taken at a resting state? Yes  Focused Assessment No change from prior assessment  Early Detection of Sepsis Score *See Row Information* Low  MEWS guidelines implemented *See Row Information* Yes  Treat  MEWS Interventions Escalated (See documentation below)  Take Vital Signs  Increase Vital Sign Frequency  Yellow: Q 2hr X 2 then Q 4hr X 2, if remains yellow, continue Q 4hrs  Escalate  MEWS: Escalate Yellow: discuss with charge nurse/RN and consider discussing with provider and RRT  Notify: Charge Nurse/RN  Name of Charge Nurse/RN Notified Vinnie Level, RN  Date Charge Nurse/RN Notified 02/05/21  Time Charge Nurse/RN Notified 1900  Document  Patient Outcome Other (Comment) (stable)  Progress note created (see row info) Yes

## 2021-02-06 ENCOUNTER — Inpatient Hospital Stay (HOSPITAL_COMMUNITY): Payer: Medicare Other | Admitting: Anesthesiology

## 2021-02-06 ENCOUNTER — Encounter (HOSPITAL_COMMUNITY): Payer: Self-pay | Admitting: Internal Medicine

## 2021-02-06 ENCOUNTER — Inpatient Hospital Stay (HOSPITAL_COMMUNITY): Payer: Medicare Other

## 2021-02-06 ENCOUNTER — Encounter (HOSPITAL_COMMUNITY)
Admission: EM | Disposition: A | Discharge status: Home Health | Payer: Self-pay | Source: Home / Self Care | Attending: Internal Medicine

## 2021-02-06 DIAGNOSIS — R748 Abnormal levels of other serum enzymes: Secondary | ICD-10-CM | POA: Diagnosis not present

## 2021-02-06 DIAGNOSIS — K8051 Calculus of bile duct without cholangitis or cholecystitis with obstruction: Secondary | ICD-10-CM

## 2021-02-06 DIAGNOSIS — K8050 Calculus of bile duct without cholangitis or cholecystitis without obstruction: Secondary | ICD-10-CM

## 2021-02-06 HISTORY — PX: ESOPHAGOGASTRODUODENOSCOPY (EGD) WITH PROPOFOL: SHX5813

## 2021-02-06 HISTORY — PX: EUS: SHX5427

## 2021-02-06 HISTORY — PX: ERCP: SHX5425

## 2021-02-06 HISTORY — PX: BILIARY DILATION: SHX6850

## 2021-02-06 HISTORY — PX: REMOVAL OF STONES: SHX5545

## 2021-02-06 LAB — COMPREHENSIVE METABOLIC PANEL
ALT: 254 U/L — ABNORMAL HIGH (ref 0–44)
AST: 264 U/L — ABNORMAL HIGH (ref 15–41)
Albumin: 3.1 g/dL — ABNORMAL LOW (ref 3.5–5.0)
Alkaline Phosphatase: 286 U/L — ABNORMAL HIGH (ref 38–126)
Anion gap: 12 (ref 5–15)
BUN: 43 mg/dL — ABNORMAL HIGH (ref 8–23)
CO2: 23 mmol/L (ref 22–32)
Calcium: 8.6 mg/dL — ABNORMAL LOW (ref 8.9–10.3)
Chloride: 103 mmol/L (ref 98–111)
Creatinine, Ser: 2.04 mg/dL — ABNORMAL HIGH (ref 0.44–1.00)
GFR, Estimated: 22 mL/min — ABNORMAL LOW (ref >60)
Glucose, Bld: 110 mg/dL — ABNORMAL HIGH (ref 70–99)
Potassium: 4.6 mmol/L (ref 3.5–5.1)
Sodium: 138 mmol/L (ref 135–145)
Total Bilirubin: 7.6 mg/dL — ABNORMAL HIGH (ref 0.3–1.2)
Total Protein: 6 g/dL — ABNORMAL LOW (ref 6.5–8.1)

## 2021-02-06 LAB — CBC
HCT: 44.5 % (ref 36.0–46.0)
Hemoglobin: 14.8 g/dL (ref 12.0–15.0)
MCH: 32.7 pg (ref 26.0–34.0)
MCHC: 33.3 g/dL (ref 30.0–36.0)
MCV: 98.5 fL (ref 80.0–100.0)
Platelets: UNDETERMINED 10*3/uL (ref 150–400)
RBC: 4.52 MIL/uL (ref 3.87–5.11)
RDW: 14.5 % (ref 11.5–15.5)
WBC: 17 10*3/uL — ABNORMAL HIGH (ref 4.0–10.5)
nRBC: 0 % (ref 0.0–0.2)

## 2021-02-06 LAB — PROTIME-INR
INR: 1.9 — ABNORMAL HIGH (ref 0.8–1.2)
Prothrombin Time: 21.3 seconds — ABNORMAL HIGH (ref 11.4–15.2)

## 2021-02-06 SURGERY — ESOPHAGOGASTRODUODENOSCOPY (EGD) WITH PROPOFOL
Anesthesia: General

## 2021-02-06 MED ORDER — GLUCAGON HCL RDNA (DIAGNOSTIC) 1 MG IJ SOLR
INTRAMUSCULAR | Status: AC
  Filled 2021-02-06: qty 1

## 2021-02-06 MED ORDER — SODIUM CHLORIDE 0.9 % IV SOLN
INTRAVENOUS | Status: DC | PRN
  Administered 2021-02-06: 100 mL

## 2021-02-06 MED ORDER — SODIUM CHLORIDE 0.9% IV SOLUTION: Freq: Once | INTRAVENOUS | Status: AC

## 2021-02-06 MED ORDER — SUGAMMADEX SODIUM 200 MG/2ML IV SOLN
INTRAVENOUS | Status: DC | PRN
  Administered 2021-02-06: 200 mg via INTRAVENOUS

## 2021-02-06 MED ORDER — PHENYLEPHRINE HCL (PRESSORS) 10 MG/ML IV SOLN
INTRAVENOUS | Status: DC | PRN
  Administered 2021-02-06: 120 ug via INTRAVENOUS
  Administered 2021-02-06 (×2): 80 ug via INTRAVENOUS

## 2021-02-06 MED ORDER — ROCURONIUM BROMIDE 100 MG/10ML IV SOLN
INTRAVENOUS | Status: DC | PRN
  Administered 2021-02-06: 60 mg via INTRAVENOUS

## 2021-02-06 MED ORDER — ALBUMIN HUMAN 5 % IV SOLN: INTRAVENOUS | Status: DC | PRN

## 2021-02-06 MED ORDER — ETOMIDATE 2 MG/ML IV SOLN
INTRAVENOUS | Status: DC | PRN
  Administered 2021-02-06: 20 mg via INTRAVENOUS

## 2021-02-06 MED ORDER — VASOPRESSIN 20 UNIT/ML IV SOLN
INTRAVENOUS | Status: DC | PRN
  Administered 2021-02-06: 1 [IU] via INTRAVENOUS

## 2021-02-06 MED ORDER — LIDOCAINE 2% (20 MG/ML) 5 ML SYRINGE
INTRAMUSCULAR | Status: DC | PRN
  Administered 2021-02-06: 60 mg via INTRAVENOUS

## 2021-02-06 MED ORDER — FENTANYL CITRATE (PF) 100 MCG/2ML IJ SOLN
INTRAMUSCULAR | Status: DC | PRN
  Administered 2021-02-06: 25 ug via INTRAVENOUS

## 2021-02-06 MED ORDER — FENTANYL CITRATE PF 50 MCG/ML IJ SOSY
12.5000 ug | PREFILLED_SYRINGE | INTRAMUSCULAR | Status: DC | PRN
  Administered 2021-02-06 – 2021-02-07 (×3): 12.5 ug via INTRAVENOUS
  Filled 2021-02-06 (×3): qty 1

## 2021-02-06 MED ORDER — PHENYLEPHRINE HCL-NACL 20-0.9 MG/250ML-% IV SOLN
INTRAVENOUS | Status: DC | PRN
  Administered 2021-02-06: 50 ug/min via INTRAVENOUS

## 2021-02-06 MED ORDER — INDOMETHACIN 50 MG RE SUPP
RECTAL | Status: DC | PRN
  Administered 2021-02-06: 100 mg via RECTAL

## 2021-02-06 MED ORDER — ONDANSETRON HCL 4 MG/2ML IJ SOLN
INTRAMUSCULAR | Status: DC | PRN
  Administered 2021-02-06: 4 mg via INTRAVENOUS

## 2021-02-06 MED ORDER — LACTATED RINGERS IV SOLN: INTRAVENOUS | Status: DC | PRN

## 2021-02-06 MED ORDER — LACTATED RINGERS IV SOLN: INTRAVENOUS | Status: DC

## 2021-02-06 MED ORDER — INDOMETHACIN 50 MG RE SUPP
RECTAL | Status: AC
  Filled 2021-02-06: qty 2

## 2021-02-06 MED ORDER — CIPROFLOXACIN IN D5W 400 MG/200ML IV SOLN
INTRAVENOUS | Status: AC
  Filled 2021-02-06: qty 200

## 2021-02-06 SURGICAL SUPPLY — 15 items

## 2021-02-06 NOTE — Progress Notes (Addendum)
Report given to Endo RN, Butch Penny. 20G IV placed in left AC. Blood consent and ERCP consent obtained from son at bedside. 1 unit of FFP started per endo RN.   Monitored the patient for the first 15 minutes of trabsfusion. Butch Penny, RN at bedside to get the patient off the unit. Handoff given to the oncoming shift. Patient left the floor on bed along with the son.

## 2021-02-06 NOTE — Progress Notes (Signed)
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 fib on Pradaxa, complete heart block with PPM in place, CAD, chronic diastolic CHF, COPD, moderate aortic insufficiency, HLD, HTN, bilateral renal artery stenosis presents to the ED via EMS for evaluation of severe chest pain and abdominal pain nausea vomiting. She was seen in the ED underwent blood work up that showed  Elevated liver enzymes (AST 763, ALT 291, alk phos 229, T bili 3.3).  Transaminitis significantly worse compared to labs done 2 years ago.  Lipase normal, chest x-ray mild basilar atelectasis, CTA chest/abdomen/pelvis increase in infrarenal abdominal aortic aneurysm to 6.2 cm from 5.8 cm no finding to suggest imminent rupture , stable 4.2 cm ascending thoracic aortic aneurysm.  Vascular surgery were consulted and patient again declined any surgical intervention of her aneurysm. Patient was admitted for further management, lft worsening- and having ongoign abdomen pain- gi and palliative consulted.  Subjective: Seen and examined.  Son is at the bedside.  Patient is recovering from anesthesia that secured during ERCP able to tell me the location her name and her age.Mildly tender on abdominal exam.  Assessment & Plan:  Infrarenal AAA increasing 6.2 cm from 5.8 cm: Again seen by vascular surgery and patient has declined surgical intervention, advised outpatient follow-up.    Abdominal pain/epigastric and lower chest pain Abnormal LFTs/liver dysfunction Choledocholithiasis symptomatic Purulent cholangitis: CT chest abdomen pelvis showing enlarging AAA and thoracic aortic aneurysm and abnormal lfts-suspected her  presentation could be secondary to recurrent biliary obstruction-GI consulted placed on antibiotics and after palliative meeting family agreed to pursue ERCP. pain management with oral and IV opiates with caution as she gets more confused with opioids.  Continue IV fluids, Pepcid.  ERCP completed this morning recurrent choledocholithiasis causing purulent cholangitis sphincterotomy done.  Continue IV Unasyn, ivf. Recent Labs  Lab 02/04/21 1714 02/05/21 0030 02/05/21 0422 02/06/21 0126 02/06/21 0410  AST 763*  --  703*  --  264*  ALT 291*  --  457*  --  254*  ALKPHOS 229*  --  343*  --  286*  BILITOT 3.3*  --  7.5*  --  7.6*  PROT 6.2*  --  7.0  --  6.0*  ALBUMIN 3.6  --  3.8  --  3.1*  INR  --  1.4*  --  1.9*  --     Acute metabolic encephalopathy multifactorial in the setting of liver dysfunction, ongoing pain, due to pain medications.keep on supportive care, fall precaution.   AKI on CKD stage IIIa: Baseline creatinine 0.9-1.  Creatinine further worsening, increase Ringer's lactate, holding Lasix and nephrotoxic medication.Suspect 2/2 decreased oral intake, iv iodine contrast use, add gentle IV fluids but watch for fluid overload due to diastolic CHF. Recent Labs    02/04/21 1714 02/05/21 0422 02/06/21 0410  BUN 21 25* 43*  CREATININE 1.05* 1.58* 2.04*   Chronic diastolic CHF euvolemic, on the dry side hold diuretics, on ivf.  CAD HTN HLD Troponin 29, monitor.EKG interpretation limited due to paced rhythm.   Persistent A. Fib: History of failed ablation and antiarrhythmics with pacemaker in place, holding Pradaxa and holding anticoagulation and will resume tomorrow as per GI   Complete heart block s/p PPM  COPD  no signs of exacerbation  Other incidental finding as below Stable thoracic ascending aortic aneurysm 4.2 cm-outpatient follow-up Cardiomegaly noted in the CT Pulmonary hypertension noted in CT scan showing enlarged bilateral pulmonary arteries Indeterminate  bilateral 1.1 cm renal lesions Stable 2.8 cm left angiomyolipoma Diffuse colonic diverticulosis without diverticulitis  Goals of care: DNR, overall prognosis does not appear bright in the setting of her increasing AAA and has declined surgery and has  multiple comorbidities, advanced age.  Palliative medicine has been consulted appreciate input and patient will be followed very closely by PMT.  Patient's family will make decision based upon patient's overall recovery  Diet Order             Diet NPO time specified  Diet effective now                  DVT prophylaxis:  Code Status:   Code Status: DNR  Family Communication: plan of care discussed with patient/s son yesterday Status is: Observation Patient remains hospitalized for due to ongoing management of AKI, liver dysfunction Dispo: The patient is from: Home              Anticipated d/c is to: Home              Patient currently is not medically stable to d/c.   Difficult to place patient No  Objective: Vitals: Today's Vitals   02/06/21 0926 02/06/21 0935 02/06/21 0945 02/06/21 0959  BP: 124/77 134/76 126/63 124/79  Pulse: 69 70 70 68  Resp: (!) 27 (!) 29 (!) 27 (!) 21  Temp: 97.9 F (36.6 C)     TempSrc: Temporal     SpO2: 97% 99% 95% 95%  Weight:      Height:      PainSc: 0-No pain 0-No pain 0-No pain    Examination:  General exam: AAOx2, elderly, older than stated age, weak appearing. HEENT:Oral mucosa DRY, Ear/Nose WNL grossly, dentition normal. Respiratory system: bilaterally.  Breath sounds, no use of accessory muscle Cardiovascular system: S1 & S2 +, No JVD,. Gastrointestinal system: Abdomen soft, tender to palpation NT,ND, BS+ Nervous System:Alert, awake, moving extremities and grossly nonfocal Extremities: no edema, distal peripheral pulses palpable.  Skin: No rashes,no icterus. MSK: Normal muscle bulk,tone, power    Intake/Output Summary (Last 24 hours) at 02/06/2021 1046 Last data filed at 02/06/2021  0924 Gross per 24 hour  Intake 1355.31 ml  Output 0 ml  Net 1355.31 ml   Filed Weights   02/04/21 1706 02/06/21 0724  Weight: 56.7 kg 56.7 kg   Weight change:    Consultants: GI VVS Palliative medicine  Procedures: ERCP  Recurrent choledocholithiasis causing purulent cholangititis. - This was treated by balloon sphincteroplasty of the previous sphincterotomy site and balloon sweeping of the bile duct - Deep periampullary duodenal diverticulum increased the technical comlexity of this case and may (?) contribute to the recurrent nature of her cholecholithiasis. Impression: - Continue IV antibiotics. - Clear liquids probably safe tomorrow depending on her clinical course. - Trend LFTs. - OK to resume her blood thinners tomorrow.  Antimicrobials: Anti-infectives (From admission, onward)    Start     Dose/Rate Route Frequency Ordered Stop   02/05/21 1300  ampicillin-sulbactam (UNASYN) 1.5 g in sodium chloride 0.9 % 100 mL IVPB        1.5 g 200 mL/hr over 30 Minutes Intravenous 2 times daily 02/05/21 1247        Culture/Microbiology    Component Value Date/Time  SDES BLOOD RIGHT ANTECUBITAL 08/05/2015 1308   SPECREQUEST BOTTLES DRAWN AEROBIC AND ANAEROBIC 5 CC EACH 08/05/2015 1308   CULT  08/05/2015 1308    NO GROWTH 5 DAYS Performed at [place] [facility] 08/10/2015 FINAL 08/05/2015 1308    Other culture-see note  Unresulted Labs (From admission, onward)     Start     Ordered   02/07/21 0500  Comprehensive metabolic panel  Tomorrow morning,   R       Question:  Specimen collection method  Answer:  Lab=Lab collect   02/06/21 1016   02/07/21 0500  CBC  Tomorrow morning,   R       Question:  Specimen collection method  Answer:  Lab=Lab collect   02/06/21 1016            Medications reviewed:  Scheduled Meds:  famotidine (PEPCID) IV  20 mg Intravenous Q24H   indomethacin  100 mg Rectal Once   Continuous Infusions:  ampicillin-sulbactam  (UNASYN) IV 1.5 g (02/05/21 2249)   lactated ringers       Intake/Output from previous day: 09/26 0701 - 09/27 0700 In: 705.3 [I.V.:505.9; IV Piggyback:199.4] Out: -  Intake/Output this shift: Total I/O In: 650 [I.V.:400; IV Piggyback:250] Out: 0  Filed Weights   02/04/21 1706 02/06/21 0724  Weight: 56.7 kg 56.7 kg   Data Reviewed: I have personally reviewed following labs and imaging studies CBC: Recent Labs  Lab 02/04/21 1714 02/06/21 0410  WBC 7.9 17.0*  NEUTROABS 7.1  --   HGB 15.0 14.8  HCT 45.8 44.5  MCV 99.8 98.5  PLT 146* PLATELET CLUMPS NOTED ON SMEAR, UNABLE TO ESTIMATE   Basic Metabolic Panel: Recent Labs  Lab 02/04/21 1714 02/05/21 0422 02/06/21 0410  NA 136 138 138  K 4.0 3.8 4.6  CL 100 98 103  CO2 25 23 23   GLUCOSE 109* 89 110*  BUN 21 25* 43*  CREATININE 1.05* 1.58* 2.04*  CALCIUM 9.0 9.6 8.6*   GFR: Estimated Creatinine Clearance: 13.6 mL/min (A) (by C-G formula based on SCr of 2.04 mg/dL (H)). Liver Function Tests: Recent Labs  Lab 02/04/21 1714 02/05/21 0422 02/06/21 0410  AST 763* 703* 264*  ALT 291* 457* 254*  ALKPHOS 229* 343* 286*  BILITOT 3.3* 7.5* 7.6*  PROT 6.2* 7.0 6.0*  ALBUMIN 3.6 3.8 3.1*   Recent Labs  Lab 02/04/21 1714  LIPASE 42   No results for input(s): AMMONIA in the last 168 hours. Coagulation Profile: Recent Labs  Lab 02/05/21 0030 02/06/21 0126  INR 1.4* 1.9*   Cardiac Enzymes: No results for input(s): CKTOTAL, CKMB, CKMBINDEX, TROPONINI in the last 168 hours. BNP (last 3 results) No results for input(s): PROBNP in the last 8760 hours. HbA1C: No results for input(s): HGBA1C in the last 72 hours. CBG: No results for input(s): GLUCAP in the last 168 hours. Lipid Profile: No results for input(s): CHOL, HDL, LDLCALC, TRIG, CHOLHDL, LDLDIRECT in the last 72 hours. Thyroid Function Tests: No results for input(s): TSH, T4TOTAL, FREET4, T3FREE, THYROIDAB in the last 72 hours. Anemia Panel: No results  for input(s): VITAMINB12, FOLATE, FERRITIN, TIBC, IRON, RETICCTPCT in the last 72 hours. Sepsis Labs: No results for input(s): PROCALCITON, LATICACIDVEN in the last 168 hours.  Recent Results (from the past 240 hour(s))  Resp Panel by RT-PCR (Flu A&B, Covid) Nasopharyngeal Swab     Status: None   Collection Time: 02/04/21  9:40 PM   Specimen: Nasopharyngeal Swab; Nasopharyngeal(NP) swabs in  vial transport medium  Result Value Ref Range Status   SARS Coronavirus 2 by RT PCR NEGATIVE NEGATIVE Final    Comment: (NOTE) SARS-CoV-2 target nucleic acids are NOT DETECTED.  The SARS-CoV-2 RNA is generally detectable in upper respiratory specimens during the acute phase of infection. The lowest concentration of SARS-CoV-2 viral copies this assay can detect is 138 copies/mL. A negative result does not preclude SARS-Cov-2 infection and should not be used as the sole basis for treatment or other patient management decisions. A negative result may occur with  improper specimen collection/handling, submission of specimen other than nasopharyngeal swab, presence of viral mutation(s) within the areas targeted by this assay, and inadequate number of viral copies(<138 copies/mL). A negative result must be combined with clinical observations, patient history, and epidemiological information. The expected result is Negative.  Fact Sheet for Patients:  EntrepreneurPulse.com.au  Fact Sheet for Healthcare Providers:  IncredibleEmployment.be  This test is no t yet approved or cleared by the [place] FDA and  has been authorized for detection and/or diagnosis of SARS-CoV-2 by FDA under an Emergency Use Authorization (EUA). This EUA will remain  in effect (meaning this test can be used) for the duration of the COVID-19 declaration under Section 564(b)(1) of the Act, 21 U.S.C.section 360bbb-3(b)(1), unless the authorization is terminated  or revoked sooner.        Influenza A by PCR NEGATIVE NEGATIVE Final   Influenza B by PCR NEGATIVE NEGATIVE Final    Comment: (NOTE) The Xpert Xpress SARS-CoV-2/FLU/RSV plus assay is intended as an aid in the diagnosis of influenza from Nasopharyngeal swab specimens and should not be used as a sole basis for treatment. Nasal washings and aspirates are unacceptable for Xpert Xpress SARS-CoV-2/FLU/RSV testing.  Fact Sheet for Patients: EntrepreneurPulse.com.au  Fact Sheet for Healthcare Providers: IncredibleEmployment.be  This test is not yet approved or cleared by the [place] FDA and has been authorized for detection and/or diagnosis of SARS-CoV-2 by FDA under an Emergency Use Authorization (EUA). This EUA will remain in effect (meaning this test can be used) for the duration of the COVID-19 declaration under Section 564(b)(1) of the Act, 21 U.S.C. section 360bbb-3(b)(1), unless the authorization is terminated or revoked.  Performed at [facility], [place] [address] [place], [place] [address]   MRSA Next Gen by PCR, Nasal     Status: None   Collection Time: 02/05/21  5:08 PM   Specimen: Nasal Mucosa; Nasal Swab  Result Value Ref Range Status   MRSA by PCR Next Gen NOT DETECTED NOT DETECTED Final    Comment: (NOTE) The GeneXpert MRSA Assay (FDA approved for NASAL specimens only), is one component of a comprehensive MRSA colonization surveillance program. It is not intended to diagnose MRSA infection nor to guide or monitor treatment for MRSA infections. Test performance is not FDA approved in patients less than 73 years old. Performed at [facility], [place] [address], [place],  [place] [address]      Radiology Studies: DG Chest 2 View  Result Date: 02/04/2021 CLINICAL DATA:  Chest pain, nausea, vomiting EXAM: CHEST - 2 VIEW COMPARISON:  12/05/2017, 08/10/2020 FINDINGS: Left-sided implanted cardiac device remains stable in positioning. Mild  cardiomegaly. Aortic atherosclerosis. Mild bibasilar atelectasis, slightly more prominent on the left. No pleural effusion. No pneumothorax. Advanced degenerative changes of the right shoulder. IMPRESSION: Mild bibasilar atelectasis, slightly more prominent on the left. Electronically Signed   By: Davina Poke D.O.   On: 02/04/2021 17:57  DG ERCP  Result Date: 02/06/2021 CLINICAL DATA:  Choledocholithiasis EXAM: ERCP TECHNIQUE: Multiple spot images obtained with the fluoroscopic device and submitted for interpretation post-procedure. COMPARISON:  Right upper quadrant ultrasound on 02/04/2021 FINDINGS: Imaging with a C-arm during the endoscopic procedure shows cannulation of the common bile duct with cholangiogram demonstrating dilatation of the common duct common visualized intrahepatic ducts and suggestion of multiple filling defects consistent with choledocholithiasis. Presumed removal common duct stones with a final cholangiogram showing no significant residual filling defects in the common bile duct. IMPRESSION: Choledocholithiasis with removal of common bile duct calculi. These images were submitted for radiologic interpretation only. Please see the procedural report for the amount of contrast and the fluoroscopy time utilized. Electronically Signed   By: Aletta Edouard M.D.   On: 02/06/2021 10:12   CT Angio Chest/Abd/Pel for Dissection W and/or Wo Contrast  Result Date: 02/04/2021 CLINICAL DATA:  Abdominal pain, aortic dissection suspected. concerned for dissection. EXAM: CT ANGIOGRAPHY CHEST, ABDOMEN AND PELVIS TECHNIQUE: Non-contrast CT of the chest was initially obtained. Multidetector CT imaging through the chest, abdomen and pelvis was performed using the standard protocol during bolus administration of intravenous contrast. Multiplanar reconstructed images and MIPs were obtained and reviewed to evaluate the vascular anatomy. CONTRAST:  51m OMNIPAQUE IOHEXOL 350 MG/ML SOLN COMPARISON:  CT  angiography chest, abdomen, pelvis 08/10/2020 FINDINGS: CTA CHEST FINDINGS Lines and tubes: Cardiovascular: Preferential opacification of the thoracic aorta. Stable aneurysmal ascending thoracic aorta measuring up to 4.2 cm. The descending thoracic aorta is normal in caliber. No dissection identified. Severe atherosclerotic plaque. Four-vessel coronary artery calcifications. Enlarged heart. No pericardial effusion. The bilateral main pulmonary arteries is enlarged in caliber. No central or segmental pulmonary embolus. Mediastinum/Nodes: No enlarged mediastinal, hilar, or axillary lymph nodes. Thyroid gland, trachea, and esophagus demonstrate no significant findings. Lungs/Pleura: No focal consolidation. No pulmonary nodule. No pulmonary mass. No pleural effusion. No pneumothorax. Musculoskeletal: Review of the MIP images confirms the above findings. CTA ABDOMEN AND PELVIS FINDINGS VASCULAR Aorta: Severe atherosclerotic plaque. No suprarenal abdominal aorta aneurysm. Interval increase in size of an infrarenal abdominal aorta measuring up to 6.2 cm (from 5.8 cm). The aneurysm is again noted to extend approximately 7 cm in the craniocaudal dimension to the aortic bifurcation. Large thrombosed portion is again noted. No surrounding fat stranding or draping over the lumbar thoracic aorta. No dissection identified. Celiac: Atherosclerotic plaque. Patent without evidence of aneurysm, dissection, vasculitis or significant stenosis. SMA: Atherosclerotic plaque. Patent without evidence of aneurysm, dissection, vasculitis or significant stenosis. Renals: Atherosclerotic plaque. Both renal arteries are patent without evidence of aneurysm, dissection, vasculitis, fibromuscular dysplasia or significant stenosis. IJJO:[id number]. Patent without evidence of aneurysm, dissection, vasculitis or significant stenosis. Inflow: Patent without evidence of aneurysm, dissection, vasculitis or significant stenosis. Veins: No  obvious venous abnormality within the limitations of this arterial phase study. Review of the MIP images confirms the above findings. NON-VASCULAR Hepatobiliary: No focal liver abnormality. No gallstones, gallbladder wall thickening, or pericholecystic fluid. No biliary dilatation. Pancreas: No focal lesion. Normal pancreatic contour. No surrounding inflammatory changes. No main pancreatic ductal dilatation. Spleen: Normal in size without focal abnormality. Adrenals/Urinary Tract: No adrenal nodule bilaterally. Bilateral kidneys enhance symmetrically. Similar-appearing 2.8 cm heterogeneous fat containing left renal lesion likely representing an angiomyolipoma. Stable 1.1 cm left hypodense lesion with a density of 83 Hounsfield units. Subcentimeter hypodensities are too small to characterize. Stable 1.1 cm right inferior renal pole lesion with a density of 46 Hounsfield units. No hydronephrosis. No hydroureter. The  urinary bladder is unremarkable. Stomach/Bowel: Stomach is within normal limits. No evidence of bowel wall thickening or dilatation. Diffuse colonic diverticulosis. Appendix appears normal. Lymphatic: No lymphadenopathy. Reproductive: The uterus and bilateral adnexal regions are unremarkable. Other: No intraperitoneal free fluid. No intraperitoneal free gas. No organized fluid collection. Musculoskeletal: No abdominal wall hernia or abnormality. No suspicious lytic or blastic osseous lesions. No acute displaced fracture. Multilevel degenerative changes of the spine with a compression fracture status post kyphoplasty at the L3 level. Old healed pelvic fractures. Review of the MIP images confirms the above findings. IMPRESSION: 1. Interval increase in size of a known infrarenal abdominal aorta aneurysm (6.2 cm from 5.8 cm). No findings to suggest imminent rupture. Recommend referral to a vascular specialist. This recommendation follows ACR consensus guidelines: White Paper of the ACR Incidental Findings  Committee II on Vascular Findings. J Am Coll Radiol 2013; 10:789-794. 2. Stable aneurysmal thoracic ascending aorta (4.2 cm). Recommend annual imaging followup by CTA or MRA. This recommendation follows 2010 ACCF/AHA/AATS/ACR/ASA/SCA/SCAI/SIR/STS/SVM Guidelines for the Diagnosis and Management of Patients with Thoracic Aortic Disease. Circulation. 2010; 121: P014-D030. Aortic aneurysm NOS (ICD10-I71.9) 3. Cardiomegaly. 4. Enlarged bilateral main pulmonary arteries suggestive of pulmonary hypertension. No central or segmental pulmonary embolus. 5. Aortic Atherosclerosis (ICD10-I70.0) including four-vessel coronary calcifications. 6. Other imaging findings of potential clinical significance: Stable indeterminate bilateral 1.1 cm renal lesions. 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 Diffuse colonic diverticulosis with no acute diverticulitis. These results were called by telephone at the time of interpretation on 02/04/2021 at 7:07 pm to provider Dr. Sabra Heck, who verbally acknowledged these results. Electronically Signed   By: Iven Finn M.D.   On: 02/04/2021 19:27   US Abdomen Limited RUQ (LIVER/GB)  Result Date: 02/04/2021 CLINICAL DATA:  Elevated liver enzymes. EXAM: ULTRASOUND ABDOMEN LIMITED RIGHT UPPER QUADRANT COMPARISON:  CT angiography abdomen pelvis 12/22/2019, right upper quadrant ultrasound 12/28/2016 FINDINGS: Gallbladder: Status post cholecystectomy. Biliary ducts: Intra and extrahepatic biliary ductal dilatation. Common bile duct diameter: 13 mm (stable compared to prior). Liver: No focal lesion identified. Within normal limits in parenchymal echogenicity. Portal vein is patent on color Doppler imaging with normal direction of blood flow towards the liver. Other: None. IMPRESSION: Persistent intra and extrahepatic biliary ductal dilatation which can be seen in the post cholecystectomy setting. Electronically Signed   By: Iven Finn M.D.   On: 02/04/2021 23:00     LOS: 1 day   Antonieta Pert,  MD [facility]  02/06/2021, 10:46 AM

## 2021-02-06 NOTE — Op Note (Addendum)
[facility] Patient Name: [name] Procedure Date : 02/06/2021 MRN: [id number] Attending MD: Milus Banister , MD Date of Birth: 1926-02-19 CSN: [id number] Age: 87 Admit Type: Inpatient Procedure:                ERCP Indications:              Known recurrent choledocholithiasis, with                            complicated ERCPs given periampullary diverticulum;                            Last ERCP at [facility]; now with cholangitis Providers:                Milus Banister, MD, Doristine Johns, RN, Tyna Jaksch Technician Referring MD:              Medicines:                General Anesthesia, Unasyn in hospital,                            Indomethacin 182 mg PR Complications:            No immediate complications. Estimated blood loss:                            None Estimated Blood Loss:     Estimated blood loss: none. Procedure:                Pre-Anesthesia Assessment:                           - Prior to the procedure, a History and Physical                            was performed, and patient medications and                            allergies were reviewed. The patient's tolerance of                            previous anesthesia was also reviewed. The risks                            and benefits of the procedure and the sedation                            options and risks were discussed with the patient.                            All questions were answered, and informed consent                            was obtained. Prior Anticoagulants:  The patient has                            taken Pradaxa (dabigatran), last dose was 3 days                            prior to procedure. ASA Grade Assessment: IV - A                            patient with severe systemic disease that is a                            constant threat to life. After reviewing the risks                            and benefits, the patient was deemed in                             satisfactory condition to undergo the procedure.                           After obtaining informed consent, the scope was                            passed under direct vision. Throughout the                            procedure, the patient's blood pressure, pulse, and                            oxygen saturations were monitored continuously. The                            [id number]-Q180V ([id number] Olympus duodenoscope was                            introduced through the mouth, and used to inject                            contrast into and used to inject contrast into the                            bile duct and ventral pancreatic duct. The ERCP was                            accomplished without difficulty. The patient                            tolerated the procedure well. Scope In: Scope Out: Findings:      Scout film showed surgical clips in the right upper quadrant, evidence       of other metallic devices, coils throughout her abdomen, likely previous       kyphoplasty as well. The duodenoscope was advanced to the region  of the       major papilla without detailed examination of the upper GI tract. The       major papilla was deeply within a periampullary diverticulum. I was       never able to actually view the major papilla itself. I rather blindly       probed with a 44 autotome over a 0.035 Hydra wire and was able to       cannulate the main pancreatic duct. I left that wire in place within the       main pancreatic duct and used a 0.025 Hydra wire to cannulate the bile       duct by flouroscopically angling off of the wire that was within the       pancreatic duct. Cholangiogram revealed diffusely dilated main bile duct       and left intrahepatic duct. The right intrahepatic ducts never filled       with contrast. There was clear floating amorphous debris within the       common bile duct. I used a 9 to 12 mm biliary retrieval balloon to sweep       the  duct several times. This delivered multiple amorphous stone       fragments and large volume of purulence into the duodenum. I became       concerned that the sphincterotomy orifice might not be large enough to       allow complete sweeping of the bile duct and so I then proceeded to       dilate the previous sphincterotomy site using a 4 cm long 8 mm       Hurricaine dilating balloon. Again this was never completely within view       as it was deep within the periampullary diverticulum. Following the       balloon sphincteroplasty I used the biliary retrieval balloon again and       swept the duct several more times up to 12 mm. This led to further       delivery of amorphous brown stone fragments as well as large volume       purulence. I performed a completion occlusion cholangiogram and there       did not appear to be any further filling defects within the bile duct. Impression:               - Recurrent choledocholithiasis causing purulent                            cholangititis.                           - This was treated by balloon sphincteroplasty of                            the previous sphincterotomy site and balloon                            sweeping of the bile duct                           - Deep periampullary duodenal diverticulum  increased the technical comlexity of this case and                            may (?) contribute to the recurrent nature of her                            cholecholithiasis. Recommendation:           - Continue IV antibiotics.                           - Clear liquids probably safe tomorrow depending on                            her clinical course.                           - Trend LFTs.                           - OK to resume her blood thinners tomorrow. Procedure Code(s):        --- Professional ---                           [phone], Endoscopic retrograde                            cholangiopancreatography  (ERCP); with removal of                            calculi/debris from biliary/pancreatic duct(s) Diagnosis Code(s):        --- Professional ---                           K80.50, Calculus of bile duct without cholangitis                            or cholecystitis without obstruction CPT copyright 2019 [employer]. All rights reserved. The codes documented in this report are preliminary and upon coder review may  be revised to meet current compliance requirements. Milus Banister, MD 02/06/2021 9:23:53 AM This report has been signed electronically. Number of Addenda: 0

## 2021-02-06 NOTE — Transfer of Care (Signed)
Immediate Anesthesia Transfer of Care Note  Patient: [name]  Procedure(s) Performed: ESOPHAGOGASTRODUODENOSCOPY (EGD) WITH PROPOFOL ESOPHAGEAL ENDOSCOPIC ULTRASOUND (EUS) RADIAL ENDOSCOPIC RETROGRADE CHOLANGIOPANCREATOGRAPHY (ERCP) BILIARY DILATION REMOVAL OF STONES  Patient Location: PACU  Anesthesia Type:General  Level of Consciousness: awake and patient cooperative  Airway & Oxygen Therapy: Patient Spontanous Breathing and Patient connected to face mask oxygen  Post-op Assessment: Report given to RN and Post -op Vital signs reviewed and stable  Post vital signs: Reviewed and stable  Last Vitals:  Vitals Value Taken Time  BP 124/77 02/06/21 0926  Temp 36.6 C 02/06/21 0926  Pulse 70 02/06/21 0928  Resp 28 02/06/21 0928  SpO2 95 % 02/06/21 0928  Vitals shown include unvalidated device data.  Last Pain:  Vitals:   02/06/21 0926  TempSrc: Temporal  PainSc: 0-No pain         Complications: No notable events documented.

## 2021-02-06 NOTE — Plan of Care (Signed)

## 2021-02-06 NOTE — Interval H&P Note (Signed)
History and Physical Interval Note:  02/06/2021 7:01 AM  [name]  has presented today for surgery, with the diagnosis of Elevated LFTs, epigastric pain.  The various methods of treatment have been discussed with the patient and family. After consideration of risks, benefits and other options for treatment, the patient has consented to  Procedure(s): ESOPHAGOGASTRODUODENOSCOPY (EGD) WITH PROPOFOL (N/A) ESOPHAGEAL ENDOSCOPIC ULTRASOUND (EUS) RADIAL (N/A) ENDOSCOPIC RETROGRADE CHOLANGIOPANCREATOGRAPHY (ERCP) (N/A) as a surgical intervention.  The patient's history has been reviewed, patient examined, no change in status, stable for surgery.  I have reviewed the patient's chart and labs.  Questions were answered to the patient's satisfaction.     WBC rising, INR rising, BP a bit low.  I think she is probably getting septic.  I discussed this with her son on the phone (he was in her room) and explained she is becoming sicker from probable CBD obstruction, cholangitis.  He understands ERCP is risky given her advanced age, multiple significant comorbid conditions and that she may have serious complication during or after the procedure, including death.  We agreed to proceed    Milus Banister

## 2021-02-06 NOTE — Progress Notes (Signed)
HOSPITAL MEDICINE OVERNIGHT EVENT NOTE    Nursing reports that patient is complaining of abdominal pain but is currently n.p.o. and is to undergo swallow evaluation later this morning.  Note the patient gets confused with morphine.  Will attempt very small doses of as needed IV fentanyl and reevaluate.  Natasha Emerald  MD [facility]

## 2021-02-06 NOTE — Anesthesia Postprocedure Evaluation (Addendum)
Anesthesia Post Note  Patient: Chestina Kuhnert  Procedure(s) Performed: ESOPHAGOGASTRODUODENOSCOPY (EGD) WITH PROPOFOL ESOPHAGEAL ENDOSCOPIC ULTRASOUND (EUS) RADIAL ENDOSCOPIC RETROGRADE CHOLANGIOPANCREATOGRAPHY (ERCP) BILIARY DILATION REMOVAL OF STONES     Patient location during evaluation: Endoscopy Anesthesia Type: General Level of consciousness: sedated Pain management: pain level controlled Vital Signs Assessment: post-procedure vital signs reviewed and stable Respiratory status: spontaneous breathing and respiratory function stable Cardiovascular status: stable Postop Assessment: no apparent nausea or vomiting Anesthetic complications: no   No notable events documented.  Last Vitals:  Vitals:   02/06/21 0945 02/06/21 0959  BP: 126/63 124/79  Pulse: 70 68  Resp: (!) 27 (!) 21  Temp:    SpO2: 95% 95%    Last Pain:  Vitals:   02/06/21 0945  TempSrc:   PainSc: 0-No pain                 Jonnell Hentges DANIEL

## 2021-02-06 NOTE — Progress Notes (Signed)
SLP Cancellation Note  Patient Details Name: [name] MRN: [id number] DOB: 04/26/1926   Cancelled treatment:       Reason Eval/Treat Not Completed: Patient not medically ready for POs post procedure per RN and MD note. Will f/u on subsequent date for bedside swallow evaluation.     Ellwood Dense, [name], [facility] Office Number: [phone]  Acie Fredrickson 02/06/2021, 11:48 AM

## 2021-02-06 NOTE — Anesthesia Procedure Notes (Signed)
Procedure Name: Intubation Date/Time: 02/06/2021 7:50 AM Performed by: Terrence Dupont, CRNA Pre-anesthesia Checklist: Patient identified, Emergency Drugs available, Suction available and Patient being monitored Patient Re-evaluated:Patient Re-evaluated prior to induction Oxygen Delivery Method: Circle system utilized Preoxygenation: Pre-oxygenation with 100% oxygen Induction Type: IV induction Ventilation: Mask ventilation without difficulty Grade View: Grade I Tube type: Oral Tube size: 7.0 mm Number of attempts: 1 Airway Equipment and Method: Stylet and Oral airway Placement Confirmation: ETT inserted through vocal cords under direct vision, positive ETCO2 and breath sounds checked- equal and bilateral Secured at: 21 cm Tube secured with: Tape Dental Injury: Teeth and Oropharynx as per pre-operative assessment

## 2021-02-07 ENCOUNTER — Encounter (HOSPITAL_COMMUNITY): Payer: Self-pay | Admitting: Gastroenterology

## 2021-02-07 DIAGNOSIS — Z7189 Other specified counseling: Secondary | ICD-10-CM

## 2021-02-07 DIAGNOSIS — R1084 Generalized abdominal pain: Secondary | ICD-10-CM | POA: Diagnosis not present

## 2021-02-07 DIAGNOSIS — I442 Atrioventricular block, complete: Secondary | ICD-10-CM

## 2021-02-07 DIAGNOSIS — K8033 Calculus of bile duct with acute cholangitis with obstruction: Secondary | ICD-10-CM | POA: Diagnosis not present

## 2021-02-07 DIAGNOSIS — K8309 Other cholangitis: Secondary | ICD-10-CM

## 2021-02-07 DIAGNOSIS — R7989 Other specified abnormal findings of blood chemistry: Secondary | ICD-10-CM | POA: Diagnosis not present

## 2021-02-07 DIAGNOSIS — I10 Essential (primary) hypertension: Secondary | ICD-10-CM

## 2021-02-07 DIAGNOSIS — Z515 Encounter for palliative care: Secondary | ICD-10-CM

## 2021-02-07 DIAGNOSIS — I714 Abdominal aortic aneurysm, without rupture: Secondary | ICD-10-CM | POA: Diagnosis not present

## 2021-02-07 LAB — PREPARE FRESH FROZEN PLASMA: Unit division: 0

## 2021-02-07 LAB — CBC
HCT: 41.5 % (ref 36.0–46.0)
Hemoglobin: 14.1 g/dL (ref 12.0–15.0)
MCH: 32.6 pg (ref 26.0–34.0)
MCHC: 34 g/dL (ref 30.0–36.0)
MCV: 95.8 fL (ref 80.0–100.0)
Platelets: 90 10*3/uL — ABNORMAL LOW (ref 150–400)
RBC: 4.33 MIL/uL (ref 3.87–5.11)
RDW: 14.5 % (ref 11.5–15.5)
WBC: 9.7 10*3/uL (ref 4.0–10.5)
nRBC: 0 % (ref 0.0–0.2)

## 2021-02-07 LAB — COMPREHENSIVE METABOLIC PANEL
ALT: 145 U/L — ABNORMAL HIGH (ref 0–44)
AST: 110 U/L — ABNORMAL HIGH (ref 15–41)
Albumin: 2.9 g/dL — ABNORMAL LOW (ref 3.5–5.0)
Alkaline Phosphatase: 257 U/L — ABNORMAL HIGH (ref 38–126)
Anion gap: 11 (ref 5–15)
BUN: 52 mg/dL — ABNORMAL HIGH (ref 8–23)
CO2: 19 mmol/L — ABNORMAL LOW (ref 22–32)
Calcium: 7.9 mg/dL — ABNORMAL LOW (ref 8.9–10.3)
Chloride: 106 mmol/L (ref 98–111)
Creatinine, Ser: 2.45 mg/dL — ABNORMAL HIGH (ref 0.44–1.00)
GFR, Estimated: 18 mL/min — ABNORMAL LOW (ref >60)
Glucose, Bld: 93 mg/dL (ref 70–99)
Potassium: 4.1 mmol/L (ref 3.5–5.1)
Sodium: 136 mmol/L (ref 135–145)
Total Bilirubin: 6.4 mg/dL — ABNORMAL HIGH (ref 0.3–1.2)
Total Protein: 5.4 g/dL — ABNORMAL LOW (ref 6.5–8.1)

## 2021-02-07 LAB — BPAM FFP
Blood Product Expiration Date: 202210012359
Blood Product Expiration Date: 202210012359
ISSUE DATE / TIME: 202209270642
Unit Type and Rh: 2800
Unit Type and Rh: 8400

## 2021-02-07 MED ORDER — IPRATROPIUM-ALBUTEROL 0.5-2.5 (3) MG/3ML IN SOLN
3.0000 mL | RESPIRATORY_TRACT | Status: DC | PRN
  Administered 2021-02-07 – 2021-02-09 (×4): 3 mL via RESPIRATORY_TRACT
  Filled 2021-02-07 (×4): qty 3

## 2021-02-07 MED ORDER — ISOSORBIDE MONONITRATE ER 30 MG PO TB24
15.0000 mg | ORAL_TABLET | Freq: Every day | ORAL | Status: DC
  Administered 2021-02-07 – 2021-02-08 (×2): 15 mg via ORAL
  Filled 2021-02-07 (×2): qty 1

## 2021-02-07 MED ORDER — SALINE SPRAY 0.65 % NA SOLN
1.0000 | NASAL | Status: DC | PRN
  Administered 2021-02-07 (×2): 1 via NASAL
  Filled 2021-02-07: qty 44

## 2021-02-07 MED ORDER — POLYETHYLENE GLYCOL 3350 17 G PO PACK
17.0000 g | PACK | Freq: Every day | ORAL | Status: DC
  Administered 2021-02-07: 17 g via ORAL
  Filled 2021-02-07: qty 1

## 2021-02-07 MED ORDER — LABETALOL HCL 5 MG/ML IV SOLN
10.0000 mg | INTRAVENOUS | Status: DC | PRN
  Administered 2021-02-07 – 2021-02-10 (×6): 10 mg via INTRAVENOUS
  Filled 2021-02-07 (×7): qty 4

## 2021-02-07 MED ORDER — METOPROLOL SUCCINATE ER 50 MG PO TB24
50.0000 mg | ORAL_TABLET | Freq: Two times a day (BID) | ORAL | Status: DC
  Administered 2021-02-07 – 2021-02-10 (×7): 50 mg via ORAL
  Filled 2021-02-07 (×7): qty 1

## 2021-02-07 NOTE — Evaluation (Signed)
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 fib on Pradaxa, complete heart block with PPM in place, CAD, chronic diastolic CHF, COPD, moderate aortic insufficiency, HLD, HTN, bilateral renal artery stenosis presents to the ED via EMS for evaluation of severe chest pain and abdominal pain nausea vomiting. ERCP 9/27 by Dr. Ardis Hughs with recurrent choledocholithiasis causing purulent cholangitis that was treated by balloon sphincteroplasty of the previous sphincterotomy site and balloon sweeping of the bile duct.   Clinical Impression   [name] is a 87 year old woman who presents to hospital with above medical history. On evaluation she demonstrated ability to perform bed mobility, ambulate in room with walker and perform baseline ADLs - using AE for donning socks. She also has a caregiver 4 hrs a day 6 days a week for bathing and assistance with home. She had one loss of balance with initial standing but otherwise no overt loss of balance with ambulation. She exhibited ability to lean outside of base of support and let go of walker to maneuver around table to get to chair She moves quickly and attempts to manage lines/leads requiring verbal cues for safety. If patient not in hospital environment with IV and telemetry - quick movements would not be an issue. She reports feeling weaker because she hasn't ate much in several days but demonstrates functional strength. Patient appears to be near her baseline with no acute care OT needs.      Recommendations for follow up therapy are one component of a multi-disciplinary discharge planning process, led by the attending physician.  Recommendations may be updated based on patient status,  additional functional criteria and insurance authorization.   Follow Up Recommendations  No OT follow up    Equipment Recommendations  None recommended by OT    Recommendations for Other Services       Precautions / Restrictions Precautions Precautions: Fall Restrictions Weight Bearing Restrictions: No      Mobility Bed Mobility Overal bed mobility: Needs Assistance Bed Mobility: Supine to Sit     Supine to sit: Supervision          Transfers Overall transfer level: Needs assistance Equipment used: Rolling walker (2 wheeled) Transfers: Sit to/from Stand Sit to Stand: Min guard         General transfer comment: Min guard with RW to ambulate in room. Patient moves quick and though mostly steady - is unsafe in hospital environment.    Balance Overall balance assessment: Mild deficits observed, not formally tested                                         ADL either performed or assessed with clinical judgement   ADL Overall ADL's : At baseline                                             Vision Patient Visual Report: No change from baseline Vision Assessment?: No apparent visual deficits     Perception     Praxis      Pertinent Vitals/Pain Pain Assessment: Faces Faces Pain  Scale: Hurts a little bit Pain Location: abdomen Pain Descriptors / Indicators: Grimacing     Hand Dominance Right   Extremity/Trunk Assessment Upper Extremity Assessment Upper Extremity Assessment: Overall WFL for tasks assessed   Lower Extremity Assessment Lower Extremity Assessment: Defer to PT evaluation   Cervical / Trunk Assessment Cervical / Trunk Assessment: Normal   Communication Communication Communication: HOH   Cognition Arousal/Alertness: Awake/alert Behavior During Therapy: WFL for tasks assessed/performed Overall Cognitive Status: Within Functional Limits for tasks assessed                                      General Comments       Exercises     Shoulder Instructions      Home Living Family/patient expects to be discharged to:: Private residence Living Arrangements: Alone Available Help at Discharge: Personal care attendant Type of Home: House   Entrance Stairs-Number of Steps: 1 Entrance Stairs-Rails: None Home Layout: Two level     Bathroom Shower/Tub: Teacher, early years/pre: Handicapped height     Home Equipment: Environmental consultant - 4 wheels;Walker - 2 wheels;Shower seat          Prior Functioning/Environment Level of Independence: Needs assistance  Gait / Transfers Assistance Needed: usually uses rolator but does walk away from it as well. ADL's / Homemaking Assistance Needed: Has assistance for bathing, reports she can perform ADLs - uses sock aide. Caregiver from 8-12 six days a week            OT Problem List: Impaired balance (sitting and/or standing);Pain      OT Treatment/Interventions:      OT Goals(Current goals can be found in the care plan section) Acute Rehab OT Goals OT Goal Formulation: All assessment and education complete, DC therapy  OT Frequency:     Barriers to D/C:            Co-evaluation              AM-PAC OT "6 Clicks" Daily Activity     Outcome Measure Help from another person eating meals?: None Help from another person taking care of personal grooming?: None Help from another person toileting, which includes using toliet, bedpan, or urinal?: None Help from another person bathing (including washing, rinsing, drying)?: None Help from another person to put on and taking off regular upper body clothing?: None Help from another person to put on and taking off regular lower body clothing?: None 6 Click Score: 24   End of Session Equipment Utilized During Treatment: Gait belt;Rolling walker Nurse Communication: Mobility status (RN to place chair alarm)  Activity Tolerance: Patient tolerated treatment well Patient left:  in chair;with call bell/phone within reach  OT Visit Diagnosis: Muscle weakness (generalized) (M62.81)                Time: 7616-0737 OT Time Calculation (min): 19 min Charges:  OT General Charges $OT Visit: 1 Visit OT Evaluation $OT Eval Low Complexity: 1 Low  [name], OTR/L [place]  Office [phone] Pager: [phone]   Lenward Chancellor 02/07/2021, 5:07 PM

## 2021-02-07 NOTE — Progress Notes (Signed)
Daily Progress Note   Patient Name: [name]       Date: 02/07/2021 DOB: [birth year]  Age: 87 y.o. MRN#: [id number] Attending Physician: Natasha Merino, MD Primary Care Physician: Natasha Seashore, MD Admit Date: 02/04/2021  Reason for Consultation/Follow-up: Establishing goals of care  Subjective: Chart reviewed. Patient is status post ERCP 9/27 and was found to have purulent cholangitis. Bilirubin, AST/ALT is improving. Patient was hypertensive overnight. She passed her SLP evaluation this morning with recommendation for regular diet with thin liquids.   I went to see patient at bedside. She is somewhat lethargic/sleepy but arouses easily to voice. reports abdominal pain. Beside RN states that she has tolerated her diet well.   16:07 - spoke with son [name] by phone and provided an update on patient's condition. [name] expresses relief that procedure went well and that liver function is improving. He states he is taking things "day by day".    Length of Stay: 2  Current Medications: Scheduled Meds:   famotidine (PEPCID) IV  20 mg Intravenous Q24H   indomethacin  100 mg Rectal Once   isosorbide mononitrate  15 mg Oral Daily   metoprolol succinate  50 mg Oral BID    Continuous Infusions:  ampicillin-sulbactam (UNASYN) IV 1.5 g (02/06/21 2139)   lactated ringers 100 mL/hr at 02/07/21 0400    PRN Meds: fentaNYL (SUBLIMAZE) injection, ipratropium-albuterol, labetalol, oxyCODONE, prochlorperazine, sodium chloride  Physical Exam Vitals reviewed.  Constitutional:      General: She is not in acute distress.    Appearance: She is ill-appearing.  Cardiovascular:     Comments: Paced rhythm Pulmonary:     Effort: Pulmonary effort is normal.  Neurological:     Mental Status: She is  lethargic.     Motor: Weakness present.            Vital Signs: BP (!) 154/97   Pulse 72   Temp 97.8 F (36.6 C) (Oral)   Resp 18   Ht 5\' 2"  (1.575 m)   Wt 56.7 kg   SpO2 94%   BMI 22.86 kg/m  SpO2: SpO2: 94 % O2 Device: O2 Device: Room Air O2 Flow Rate: O2 Flow Rate (L/min): 1 L/min  Intake/output summary:  Intake/Output Summary (Last 24 hours) at 02/07/2021 0953 Last data filed at 02/07/2021 0400 Gross per 24  hour  Intake 1833.22 ml  Output 125 ml  Net 1708.22 ml   LBM: Last BM Date: 02/07/21 Baseline Weight: Weight: 56.7 kg Most recent weight: Weight: 56.7 kg       Palliative Assessment/Data: PPS 30-40%        Palliative Care Assessment & Plan   HPI/Patient Profile: Taken from H&P: 87 y.o. 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 fib on Pradaxa, complete heart block status post PPM, CAD, chronic diastolic CHF, COPD, moderate aortic insufficiency, hyperlipidemia, hypertension, bilateral renal artery stenosis.  Per chart review, patient's infrarenal abdominal aortic aneurysm has been steadily increasing in size.  Followed by Dr. Donzetta Matters from vascular surgery.  Per last vascular surgery office visit note from 08/18/2020 the aneurysm was 5.8 cm in size and was amenable to endovascular repair but patient has continued to decline surgical intervention.  Per note, patient also has small bilateral renal lesions which are concerning for renal cell carcinoma but decision was made not to pursue further evaluation.    Patient presented to the [facility] ED on 02/04/21 from home with complaints of chest and abdominal pain. She was admitted on 02/04/2021 with enlarging infrarenal abdominal aortic aneurism and elevated liver enzymes.   Assessment: - elevated LFTs with epigastric abdominal pain - recurrent choledochoithiasis causing purulent cholangitis, s/p ERCP 9/27 - enlarging  infrarenal abdominal aortic aneurysm - persistent a-fib - complete heart block - chronic diastolic CHF  Recommendations/Plan: Continue current care Ongoing GOC discussions pending clinical course PMT will continue to follow  Goals of Care and Additional Recommendations: Limitations on Scope of Treatment: Full Scope Treatment without surgical intervention   Code Status: DNR/DNI   Prognosis:  Unable to determine  Discharge Planning: To Be Determined    Thank you for allowing the Palliative Medicine Team to assist in the care of this patient.   Total Time 25 minutes Prolonged Time Billed  no       Greater than 50%  of this time was spent counseling and coordinating care related to the above assessment and plan.  Natasha Bullion, NP  Please contact Palliative Medicine Team phone at [phone] for questions and concerns.

## 2021-02-07 NOTE — Progress Notes (Signed)
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 fib on Pradaxa, complete heart block with PPM in place, CAD, chronic diastolic CHF, COPD, moderate aortic insufficiency, HLD, HTN, bilateral renal artery stenosis presents to the ED via EMS for evaluation of severe chest pain and abdominal pain nausea vomiting. She was seen in the ED underwent blood work up that showed  Elevated liver enzymes (AST 763, ALT 291, alk phos 229, T bili 3.3).  Transaminitis significantly worse compared to labs done 2 years ago.  Lipase normal, chest x-ray mild basilar atelectasis, CTA chest/abdomen/pelvis increase in infrarenal abdominal aortic aneurysm to 6.2 cm from 5.8 cm no finding to suggest imminent rupture , stable 4.2 cm ascending thoracic aortic aneurysm.  Vascular surgery were consulted and patient again declined any surgical intervention of her aneurysm.  Patient was admitted for further management, lft worsening- and having ongoign abdomen pain- gi and palliative consulted.  Underwent ERCP and found to have purulent cholangitis on 9/27.   Assessment & Plan:   Principal Problem:   Choledocholithiasis with obstruction Active Problems:   HTN (hypertension)   CAD, moderate   Chest pain   Complete heart block (HCC)   AAA (abdominal aortic aneurysm) (HCC)   Elevated liver enzymes   Pain in the abdomen  Abdominal pain/epigastric and lower chest pain Abnormal LFTs/liver dysfunction Choledocholithiasis symptomatic Purulent cholangitis Status post ERCP and sphincterotomy 9/27 Cultures pending.  Currently on IV Unasyn.  We will continue. Adequate IV fluids.  Continue IV pain medications.  Pepcid. Daily monitoring of LFTs. Advance to regular diet.  Laxatives. Bleeding  with hard stool today, suspect outlet bleeding.  Laxatives.  Acute metabolic encephalopathy multifactorial in the setting of liver dysfunction, ongoing pain, due to pain medications.keep on supportive care, fall precaution and delirium precautions.  PT OT today.  Infrarenal AAA increasing 6.2 cm from 5.8 cm: Again seen by vascular surgery and patient has declined surgical intervention, advised outpatient follow-up.    AKI on CKD stage IIIa: Baseline creatinine 0.9-1.  Urine output is adequate.  Continue on maintenance fluid.  Intake and output monitoring.   Chronic diastolic CHF euvolemic, on the dry side hold diuretics, on ivf.   CAD HTN HLD Troponin 29, monitor.EKG interpretation limited due to paced rhythm.  Resume home antihypertensives including Imdur and metoprolol.  Blood pressures elevated overnight.   Persistent A. Fib: History of failed ablation and antiarrhythmics with pacemaker in place, holding Pradaxa and holding anticoagulation until medically stabilized.   Complete heart block s/p PPM, stable.   COPD no signs of exacerbation, complaint of wheezing last night.  Use albuterol.   Other incidental finding as below Stable thoracic ascending aortic aneurysm 4.2 cm-outpatient follow-up Cardiomegaly noted in the CT Pulmonary hypertension noted in CT scan showing enlarged bilateral pulmonary arteries Indeterminate bilateral 1.1 cm renal lesions Stable 2.8 cm left angiomyolipoma Diffuse colonic diverticulosis without diverticulitis   Goals of care:  DNR.  Agreed for ERCP.   Anticipated some medical stabilization to go home.   Palliative following.        DVT prophylaxis:   SCDs   Code Status: DNR Family Communication: None Disposition Plan: Status is: Inpatient  Remains inpatient appropriate because:Inpatient level of care appropriate due to severity of illness  Dispo: The patient is from: Home  Anticipated d/c is to: SNF              Patient  currently is not medically stable to d/c.   Difficult to place patient No         Consultants:  GI Vascular surgery Palliative  Procedures:  ERCP 9/27  Antimicrobials:  Unasyn 9/26---   Subjective: Patient seen and examined.  Mildly confused.  Complains of mild soreness in the abdomen.  Nursing reported small amount of bright red blood with hard stool.  Patient herself feels hungry and ready to eat. Overnight events noted.  Blood pressure is elevated and given some as needed medications as she was n.p.o.  Will resume home medications. Complaint of wheezing, improved with DuoNeb's.  Objective: Vitals:   02/07/21 0400 02/07/21 0500 02/07/21 0553 02/07/21 0700  BP: (!) 183/102 139/75 (!) 154/97   Pulse: 70  72   Resp: 20  18   Temp: 97.8 F (36.6 C)   97.8 F (36.6 C)  TempSrc: Oral   Oral  SpO2: 94%     Weight:      Height:        Intake/Output Summary (Last 24 hours) at 02/07/2021 1005 Last data filed at 02/07/2021 0400 Gross per 24 hour  Intake 1833.22 ml  Output 125 ml  Net 1708.22 ml   Filed Weights   02/04/21 1706 02/06/21 0724  Weight: 56.7 kg 56.7 kg    Examination:  General: Frail and debilitated.  Appropriate for age.  Looks generally weak and chronically sick.  Not in any distress but anxious. Cardiovascular: S1-S2 normal.  Regular rate rhythm.  No edema. Respiratory: Bilateral clear.  Some conducted airway sounds. Gastrointestinal: Soft.  Mild tenderness along the epigastrium without residual guarding.  Bowel sounds present. Ext: No cyanosis.  No edema.   Data Reviewed: I have personally reviewed following labs and imaging studies  CBC: Recent Labs  Lab 02/04/21 1714 02/06/21 0410 02/07/21 0303  WBC 7.9 17.0* 9.7  NEUTROABS 7.1  --   --   HGB 15.0 14.8 14.1  HCT 45.8 44.5 41.5  MCV 99.8 98.5 95.8  PLT 146* PLATELET CLUMPS NOTED ON SMEAR, UNABLE TO ESTIMATE 90*   Basic Metabolic Panel: Recent Labs  Lab 02/04/21 1714 02/05/21 0422  02/06/21 0410 02/07/21 0033  NA 136 138 138 136  K 4.0 3.8 4.6 4.1  CL 100 98 103 106  CO2 25 23 23  19*  GLUCOSE 109* 89 110* 93  BUN 21 25* 43* 52*  CREATININE 1.05* 1.58* 2.04* 2.45*  CALCIUM 9.0 9.6 8.6* 7.9*   GFR: Estimated Creatinine Clearance: 11.3 mL/min (A) (by C-G formula based on SCr of 2.45 mg/dL (H)). Liver Function Tests: Recent Labs  Lab 02/04/21 1714 02/05/21 0422 02/06/21 0410 02/07/21 0033  AST 763* 703* 264* 110*  ALT 291* 457* 254* 145*  ALKPHOS 229* 343* 286* 257*  BILITOT 3.3* 7.5* 7.6* 6.4*  PROT 6.2* 7.0 6.0* 5.4*  ALBUMIN 3.6 3.8 3.1* 2.9*   Recent Labs  Lab 02/04/21 1714  LIPASE 42   No results for input(s): AMMONIA in the last 168 hours. Coagulation Profile: Recent Labs  Lab 02/05/21 0030 02/06/21 0126  INR 1.4* 1.9*   Cardiac Enzymes: No results for input(s): CKTOTAL, CKMB, CKMBINDEX, TROPONINI in the last 168 hours. BNP (last 3 results) No results for input(s): PROBNP in the last 8760 hours. HbA1C: No results for input(s): HGBA1C in the last 72 hours. CBG: No results for input(s): GLUCAP in the last  168 hours. Lipid Profile: No results for input(s): CHOL, HDL, LDLCALC, TRIG, CHOLHDL, LDLDIRECT in the last 72 hours. Thyroid Function Tests: No results for input(s): TSH, T4TOTAL, FREET4, T3FREE, THYROIDAB in the last 72 hours. Anemia Panel: No results for input(s): VITAMINB12, FOLATE, FERRITIN, TIBC, IRON, RETICCTPCT in the last 72 hours. Sepsis Labs: No results for input(s): PROCALCITON, LATICACIDVEN in the last 168 hours.  Recent Results (from the past 240 hour(s))  Resp Panel by RT-PCR (Flu A&B, Covid) Nasopharyngeal Swab     Status: None   Collection Time: 02/04/21  9:40 PM   Specimen: Nasopharyngeal Swab; Nasopharyngeal(NP) swabs in vial transport medium  Result Value Ref Range Status   SARS Coronavirus 2 by RT PCR NEGATIVE NEGATIVE Final    Comment: (NOTE) SARS-CoV-2 target nucleic acids are NOT DETECTED.  The  SARS-CoV-2 RNA is generally detectable in upper respiratory specimens during the acute phase of infection. The lowest concentration of SARS-CoV-2 viral copies this assay can detect is 138 copies/mL. A negative result does not preclude SARS-Cov-2 infection and should not be used as the sole basis for treatment or other patient management decisions. A negative result may occur with  improper specimen collection/handling, submission of specimen other than nasopharyngeal swab, presence of viral mutation(s) within the areas targeted by this assay, and inadequate number of viral copies(<138 copies/mL). A negative result must be combined with clinical observations, patient history, and epidemiological information. The expected result is Negative.  Fact Sheet for Patients:  EntrepreneurPulse.com.au  Fact Sheet for Healthcare Providers:  IncredibleEmployment.be  This test is no t yet approved or cleared by the [place] FDA and  has been authorized for detection and/or diagnosis of SARS-CoV-2 by FDA under an Emergency Use Authorization (EUA). This EUA will remain  in effect (meaning this test can be used) for the duration of the COVID-19 declaration under Section 564(b)(1) of the Act, 21 U.S.C.section 360bbb-3(b)(1), unless the authorization is terminated  or revoked sooner.       Influenza A by PCR NEGATIVE NEGATIVE Final   Influenza B by PCR NEGATIVE NEGATIVE Final    Comment: (NOTE) The Xpert Xpress SARS-CoV-2/FLU/RSV plus assay is intended as an aid in the diagnosis of influenza from Nasopharyngeal swab specimens and should not be used as a sole basis for treatment. Nasal washings and aspirates are unacceptable for Xpert Xpress SARS-CoV-2/FLU/RSV testing.  Fact Sheet for Patients: EntrepreneurPulse.com.au  Fact Sheet for Healthcare Providers: IncredibleEmployment.be  This test is not yet approved or  cleared by the [place] FDA and has been authorized for detection and/or diagnosis of SARS-CoV-2 by FDA under an Emergency Use Authorization (EUA). This EUA will remain in effect (meaning this test can be used) for the duration of the COVID-19 declaration under Section 564(b)(1) of the Act, 21 U.S.C. section 360bbb-3(b)(1), unless the authorization is terminated or revoked.  Performed at [facility], [place] [address], [place], [place] [address]   MRSA Next Gen by PCR, Nasal     Status: None   Collection Time: 02/05/21  5:08 PM   Specimen: Nasal Mucosa; Nasal Swab  Result Value Ref Range Status   MRSA by PCR Next Gen NOT DETECTED NOT DETECTED Final    Comment: (NOTE) The GeneXpert MRSA Assay (FDA approved for NASAL specimens only), is one component of a comprehensive MRSA colonization surveillance program. It is not intended to diagnose MRSA infection nor to guide or monitor treatment for MRSA infections. Test performance is not FDA approved in patients less than 2  years old. Performed at [facility], [place] [address], [place], [place] [address]          Radiology Studies: DG ERCP  Result Date: 02/06/2021 CLINICAL DATA:  Choledocholithiasis EXAM: ERCP TECHNIQUE: Multiple spot images obtained with the fluoroscopic device and submitted for interpretation post-procedure. COMPARISON:  Right upper quadrant ultrasound on 02/04/2021 FINDINGS: Imaging with a C-arm during the endoscopic procedure shows cannulation of the common bile duct with cholangiogram demonstrating dilatation of the common duct common visualized intrahepatic ducts and suggestion of multiple filling defects consistent with choledocholithiasis. Presumed removal common duct stones with a final cholangiogram showing no significant residual filling defects in the common bile duct. IMPRESSION: Choledocholithiasis with removal of common bile duct calculi. These images were submitted for radiologic interpretation  only. Please see the procedural report for the amount of contrast and the fluoroscopy time utilized. Electronically Signed   By: Aletta Edouard M.D.   On: 02/06/2021 10:12        Scheduled Meds:  famotidine (PEPCID) IV  20 mg Intravenous Q24H   indomethacin  100 mg Rectal Once   isosorbide mononitrate  15 mg Oral Daily   metoprolol succinate  50 mg Oral BID   polyethylene glycol  17 g Oral Daily   Continuous Infusions:  ampicillin-sulbactam (UNASYN) IV 1.5 g (02/06/21 2139)   lactated ringers 100 mL/hr at 02/07/21 0400     LOS: 2 days    Time spent: 32 minutes    Barb Merino, MD [facility] Pager [phone]

## 2021-02-07 NOTE — Progress Notes (Signed)
HOSPITAL MEDICINE OVERNIGHT EVENT NOTE    Nursing reports that patient has been exhibiting gradually increasing blood pressures throughout the evening with now systolic blood pressures exceeding 190.  Patient denies any focal pain at this point.  Of note, patient does have a history of hypertension however blood pressure medications were held earlier in the hospitalization due to low blood pressures.  We will go ahead and order as needed intravenous labetalol for markedly elevated blood pressures.  If these remain elevated then daytime team can decide to resume patient's home oral antihypertensives.  Nursing is also reporting that patient has been exhibiting wheezing.  There is no evidence of associated hypoxia or shortness of breath with this wheezing.  Will order respiratory consultation and as needed as needed nebulized bronchodilators for symptoms.  Natasha Emerald  MD [facility]

## 2021-02-07 NOTE — Progress Notes (Signed)
Patient's BP consistently elevated throughout the night, now systolic in 901Q. Patient having some wheezing as well. Patient complaining of feeling congestion in her nose. Patient saturating above 90% on RA. Patient moaning but upon asking if she is hurting she declined. Paged MD. Hulen Skains rapid response RN to get a second opinion as well. Received orders from MD.

## 2021-02-07 NOTE — Progress Notes (Signed)
[facility] Progress Note  CC:  CBD stones, cholangitis  Subjective:  Had four hard golf ball size stools this AM with bright red rectal bleeding.  Had some HTN and wheezing overnight.  BP better this AM and no wheezing.  Says that she feels "rough".  Mild abdominal pain.  Drinking some clear liquids when I saw her.  Objective:  Vital signs in last 24 hours: Temp:  [97.8 F (36.6 C)-98.5 F (36.9 C)] 97.8 F (36.6 C) (09/28 0700) Pulse Rate:  [68-93] 72 (09/28 0553) Resp:  [18-29] 18 (09/28 0553) BP: (124-196)/(63-108) 154/97 (09/28 0553) SpO2:  [91 %-99 %] 94 % (09/28 0400) Last BM Date: 02/07/21 General:  Alert, elderly and frail, in NAD Heart:  Regular rate and rhythm; loud SEM noted. Pulm:  CTAB.  No W/R/R. Abdomen:  Soft, non-distended.  BS present.  Non-tender. Extremities:  Without edema. Neurologic:  Alert and oriented x 4;  grossly normal neurologically.  Intake/Output from previous day: 09/27 0701 - 09/28 0700 In: 2483.2 [I.V.:2033.2; IV Piggyback:450] Out: 125 [Urine:125]  Lab Results: Recent Labs    02/04/21 1714 02/06/21 0410 02/07/21 0303  WBC 7.9 17.0* 9.7  HGB 15.0 14.8 14.1  HCT 45.8 44.5 41.5  PLT 146* PLATELET CLUMPS NOTED ON SMEAR, UNABLE TO ESTIMATE 90*   BMET Recent Labs    02/05/21 0422 02/06/21 0410 02/07/21 0033  NA 138 138 136  K 3.8 4.6 4.1  CL 98 103 106  CO2 23 23 19*  GLUCOSE 89 110* 93  BUN 25* 43* 52*  CREATININE 1.58* 2.04* 2.45*  CALCIUM 9.6 8.6* 7.9*   LFT Recent Labs    02/07/21 0033  PROT 5.4*  ALBUMIN 2.9*  AST 110*  ALT 145*  ALKPHOS 257*  BILITOT 6.4*   PT/INR Recent Labs    02/05/21 0030 02/06/21 0126  LABPROT 16.7* 21.3*  INR 1.4* 1.9*   Hepatitis Panel Recent Labs    02/05/21 0030  HEPBSAG NON REACTIVE  HCVAB NON REACTIVE  HEPAIGM NON REACTIVE  HEPBIGM NON REACTIVE    DG ERCP  Result Date: 02/06/2021 CLINICAL DATA:  Choledocholithiasis EXAM: ERCP TECHNIQUE: Multiple  spot images obtained with the fluoroscopic device and submitted for interpretation post-procedure. COMPARISON:  Right upper quadrant ultrasound on 02/04/2021 FINDINGS: Imaging with a C-arm during the endoscopic procedure shows cannulation of the common bile duct with cholangiogram demonstrating dilatation of the common duct common visualized intrahepatic ducts and suggestion of multiple filling defects consistent with choledocholithiasis. Presumed removal common duct stones with a final cholangiogram showing no significant residual filling defects in the common bile duct. IMPRESSION: Choledocholithiasis with removal of common bile duct calculi. These images were submitted for radiologic interpretation only. Please see the procedural report for the amount of contrast and the fluoroscopy time utilized. Electronically Signed   By: Aletta Edouard M.D.   On: 02/06/2021 10:12    Assessment / Plan: 1.  Elevated LFTs with epigastric abdominal pain: AST 763-->703, ALT 291-->457, alk phos 229-->343, T bili 3.3-->7.5, CT with no obvious etiology, right upper quadrant ultrasound with persistent intra and extrahepatic biliary ductal dilation which could be seen status postcholecystectomy, previous ERCP in 2018 with sphincterotomy and stone extraction; most likely choledocholithiasis given her history.  ERCP 9/27 by Dr. Ardis Hughs with recurrent choledocholithiasis causing purulent cholangitis that was treated by balloon sphincteroplasty of the previous sphincterotomy site and balloon sweeping of the bile duct.  LFTs trending down. 2.  Enlarging infrarenal abdominal aortic aneurysm: Recently from  5.8 to 6.2 cm, follows with vascular surgery in regards to this but is continuing to decline surgery 3.  Persistent A. fib: Typically on Pradaxa, last dose at 02/04/21 AM, now on hold 4.  Complete heart block: Status post pacemaker placement 5.  Chronic diastolic CHF 6.  COPD 7.  Rectal bleeding:  Bright red rectal bleeding this AM  with hard golf ball size stools.  Suspect this was some hemorrhoidal/outlet bleeding rather than bleeding related to procedure yesterday.  Hgb normal this AM.  Will plan to hold Pradaxa today and monitor.  -Ok for clear liquids today (they have already been started). -Continue abx (on unasyn).  ? How long. -Continue to hold Pradaxa for now due to bleeding. -Continue to trend LFTs and Hgb. -Will add daily Miralax for constipation for now.   LOS: 2 days   Laban Emperor. Keenan Trefry  02/07/2021, 9:13 AM

## 2021-02-07 NOTE — Plan of Care (Signed)
  Problem: Education: Goal: Knowledge of General Education information will improve Description: Including pain rating scale, medication(s)/side effects and non-pharmacologic comfort measures Outcome: Progressing   Problem: Health Behavior/Discharge Planning: Goal: Ability to manage health-related needs will improve Outcome: Progressing   Problem: Clinical Measurements: Goal: Diagnostic test results will improve Outcome: Progressing   Problem: Activity: Goal: Risk for activity intolerance will decrease Outcome: Progressing   Problem: Nutrition: Goal: Adequate nutrition will be maintained Outcome: Progressing   Problem: Elimination: Goal: Will not experience complications related to bowel motility Outcome: Progressing   Problem: Pain Managment: Goal: General experience of comfort will improve Outcome: Progressing   Problem: Skin Integrity: Goal: Risk for impaired skin integrity will decrease Outcome: Progressing

## 2021-02-07 NOTE — Care Management Important Message (Signed)
Important Message  Patient Details  Name: [name] MRN: [id number] Date of Birth: 09-Mar-1926   Medicare Important Message Given:  Yes     [name] 02/07/2021, 9:05 AM
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
## 2021-02-08 ENCOUNTER — Ambulatory Visit (INDEPENDENT_AMBULATORY_CARE_PROVIDER_SITE_OTHER): Payer: Medicare Other

## 2021-02-08 DIAGNOSIS — I5032 Chronic diastolic (congestive) heart failure: Secondary | ICD-10-CM

## 2021-02-08 LAB — CBC WITH DIFFERENTIAL/PLATELET
Abs Immature Granulocytes: 0.06 10*3/uL (ref 0.00–0.07)
Basophils Absolute: 0 10*3/uL (ref 0.0–0.1)
Basophils Relative: 0 %
Eosinophils Absolute: 0 10*3/uL (ref 0.0–0.5)
Eosinophils Relative: 0 %
HCT: 38.9 % (ref 36.0–46.0)
Hemoglobin: 13.2 g/dL (ref 12.0–15.0)
Immature Granulocytes: 1 %
Lymphocytes Relative: 8 %
Lymphs Abs: 0.8 10*3/uL (ref 0.7–4.0)
MCH: 32.4 pg (ref 26.0–34.0)
MCHC: 33.9 g/dL (ref 30.0–36.0)
MCV: 95.6 fL (ref 80.0–100.0)
Monocytes Absolute: 0.7 10*3/uL (ref 0.1–1.0)
Monocytes Relative: 7 %
Neutro Abs: 8 10*3/uL — ABNORMAL HIGH (ref 1.7–7.7)
Neutrophils Relative %: 84 %
Platelets: 97 10*3/uL — ABNORMAL LOW (ref 150–400)
RBC: 4.07 MIL/uL (ref 3.87–5.11)
RDW: 14.2 % (ref 11.5–15.5)
WBC: 9.6 10*3/uL (ref 4.0–10.5)
nRBC: 0 % (ref 0.0–0.2)

## 2021-02-08 LAB — COMPREHENSIVE METABOLIC PANEL
ALT: 106 U/L — ABNORMAL HIGH (ref 0–44)
AST: 85 U/L — ABNORMAL HIGH (ref 15–41)
Albumin: 2.6 g/dL — ABNORMAL LOW (ref 3.5–5.0)
Alkaline Phosphatase: 314 U/L — ABNORMAL HIGH (ref 38–126)
Anion gap: 11 (ref 5–15)
BUN: 58 mg/dL — ABNORMAL HIGH (ref 8–23)
CO2: 19 mmol/L — ABNORMAL LOW (ref 22–32)
Calcium: 7.9 mg/dL — ABNORMAL LOW (ref 8.9–10.3)
Chloride: 106 mmol/L (ref 98–111)
Creatinine, Ser: 2.33 mg/dL — ABNORMAL HIGH (ref 0.44–1.00)
GFR, Estimated: 19 mL/min — ABNORMAL LOW (ref >60)
Glucose, Bld: 119 mg/dL — ABNORMAL HIGH (ref 70–99)
Potassium: 4.9 mmol/L (ref 3.5–5.1)
Sodium: 136 mmol/L (ref 135–145)
Total Bilirubin: 4.8 mg/dL — ABNORMAL HIGH (ref 0.3–1.2)
Total Protein: 5.3 g/dL — ABNORMAL LOW (ref 6.5–8.1)

## 2021-02-08 LAB — PHOSPHORUS: Phosphorus: 3.5 mg/dL (ref 2.5–4.6)

## 2021-02-08 LAB — MAGNESIUM: Magnesium: 2.4 mg/dL (ref 1.7–2.4)

## 2021-02-08 MED ORDER — HYDROXYZINE HCL 25 MG PO TABS
25.0000 mg | ORAL_TABLET | Freq: Three times a day (TID) | ORAL | Status: DC | PRN
  Administered 2021-02-08: 25 mg via ORAL
  Filled 2021-02-08: qty 1

## 2021-02-08 MED ORDER — DOCUSATE SODIUM 100 MG PO CAPS
100.0000 mg | ORAL_CAPSULE | Freq: Two times a day (BID) | ORAL | Status: DC
  Administered 2021-02-08 – 2021-02-09 (×4): 100 mg via ORAL
  Filled 2021-02-08 (×5): qty 1

## 2021-02-08 MED ORDER — POLYETHYLENE GLYCOL 3350 17 G PO PACK
17.0000 g | PACK | Freq: Two times a day (BID) | ORAL | Status: DC
  Administered 2021-02-08 – 2021-02-10 (×5): 17 g via ORAL
  Filled 2021-02-08 (×5): qty 1

## 2021-02-08 NOTE — Progress Notes (Signed)
Patient with bleeding with bowel movement on 9/28. Laxative was prescribed. Since, has had three attempts at bowel movements without any success. Patient was noted to be straining, moaning, and this was painful. Spoke with Dr Laurena Bering. Colace added and Miralax from once a day to two times a day. Continue to monitor.

## 2021-02-08 NOTE — Progress Notes (Signed)
Patient with audible wheezes while standing next to her bedside this a.m. Fine crackles across both lower bases. LR shut off and Dr. Laurena Bering paged, who discontinued fluids. Continue to monitor.

## 2021-02-08 NOTE — Evaluation (Signed)
Physical Therapy Evaluation Patient Details Name: [name] MRN: [id number] DOB: 1925/06/13 Today's Date: 02/08/2021  History of Present Illness  The pt is a 87 yo female presenting 9/25 with chest pain, nausea, and vomiting. CTA revealed increased size of AAA, pt declined surgical intervention. Pt now s/p ERCP 9/27 and found to have purulent cholangitis. PMH includes: persistent A. fib on Pradaxa, complete heart block with PPM in place, CAD, chronic diastolic CHF, COPD, moderate aortic insufficiency, HLD, HTN, bilateral renal artery stenosis, and CKD IIIb.   Clinical Impression  Pt in bed upon arrival of PT, agreeable to evaluation at this time. Prior to admission the pt was living alone, using a RW to mobilize at home, and receiving assist from caregiver for ADLs. The pt now presents with limitations in functional mobility, strength, power, stability, and safety awareness due to above dx, and will continue to benefit from skilled PT to address these deficits. The pt was able to follow simple cues inconsistently through the session, at times limited by [facility], but other times just needing increased processing time and cues. The pt had multiple minor LOB with gait requiring minA to steady in addition to use of RW, therefore recommend d/c home with family or caregiver for constant supervision and assist with mobility, if family unable to provide, she may benefit from increased assist available at SNF as well as continued rehab in that setting.         Recommendations for follow up therapy are one component of a multi-disciplinary discharge planning process, led by the attending physician.  Recommendations may be updated based on patient status, additional functional criteria and insurance authorization.  Follow Up Recommendations Home health PT;Supervision/Assistance - 24 hour    Equipment Recommendations  None recommended by PT    Recommendations for Other Services       Precautions /  Restrictions Precautions Precautions: Fall Restrictions Weight Bearing Restrictions: No      Mobility  Bed Mobility Overal bed mobility: Needs Assistance Bed Mobility: Supine to Sit     Supine to sit: Supervision          Transfers Overall transfer level: Needs assistance Equipment used: Rolling walker (2 wheeled) Transfers: Sit to/from Stand Sit to Stand: Min guard         General transfer comment: minG to rise from EOB and low toilet, ptreliant on BUE support on grab bars to power up  Ambulation/Gait Ambulation/Gait assistance: Min assist Gait Distance (Feet): 125 Feet Assistive device: Rolling walker (2 wheeled) Gait Pattern/deviations: Step-through pattern;Decreased stride length;Staggering left;Staggering right Gait velocity: decreased Gait velocity interpretation: <1.31 ft/sec, indicative of household ambulator General Gait Details: pt with intermittent staggering steps, minA to maintain balance. reprots no fatigue but stopping x2 to rest elbows on RW. cues for positioning in RW      Balance Overall balance assessment: Mild deficits observed, not formally tested                                           Pertinent Vitals/Pain Pain Assessment: Faces Faces Pain Scale: Hurts a little bit Pain Location: abdomen Pain Descriptors / Indicators: Grimacing Pain Intervention(s): Limited activity within patient's tolerance;Monitored during session;Repositioned    Home Living Family/patient expects to be discharged to:: Private residence Living Arrangements: Alone Available Help at Discharge: Personal care attendant Type of Home: House Home Access: Stairs to enter Entrance Stairs-Rails: None  Entrance Stairs-Number of Steps: 1 Home Layout: Two level Home Equipment: [place] - 4 wheels;Walker - 2 wheels;Shower seat      Prior Function Level of Independence: Needs assistance   Gait / Transfers Assistance Needed: usually uses rolator but does  walk away from it as well.  ADL's / Homemaking Assistance Needed: Has assistance for bathing, reports she can perform ADLs - uses sock aide. Caregiver from 8-12 six days a week        Hand Dominance   Dominant Hand: Right    Extremity/Trunk Assessment   Upper Extremity Assessment Upper Extremity Assessment: Defer to OT evaluation    Lower Extremity Assessment Lower Extremity Assessment: Generalized weakness (no difference between legs, pt denies difference in sensation. grossly functional)    Cervical / Trunk Assessment Cervical / Trunk Assessment: Kyphotic  Communication   Communication: HOH  Cognition Arousal/Alertness: Awake/alert Behavior During Therapy: WFL for tasks assessed/performed Overall Cognitive Status: Impaired/Different from baseline Area of Impairment: Following commands;Attention                   Current Attention Level: Focused   Following Commands: Follows one step commands inconsistently;Follows one step commands with increased time       General Comments: pt intermittently needing increased time to process instructions or questions, but other times answering quickly. pt awaiting cues to complete mobility, cues for safety and line management      General Comments General comments (skin integrity, edema, etc.): VSS onRA        Assessment/Plan    PT Assessment Patient needs continued PT services  PT Problem List Decreased strength;Decreased range of motion;Decreased activity tolerance;Decreased balance;Decreased mobility;Decreased coordination;Decreased safety awareness       PT Treatment Interventions DME instruction;Gait training;Stair training;Functional mobility training;Therapeutic activities;Therapeutic exercise;Balance training;Patient/family education    PT Goals (Current goals can be found in the Care Plan section)  Acute Rehab PT Goals Patient Stated Goal: return home PT Goal Formulation: With patient Time For Goal  Achievement: 02/22/21 Potential to Achieve Goals: Good    Frequency Min 3X/week   Barriers to discharge Decreased caregiver support pt states she is from home alone       AM-PAC PT "6 Clicks" Mobility  Outcome Measure Help needed turning from your back to your side while in a flat bed without using bedrails?: A Little Help needed moving from lying on your back to sitting on the side of a flat bed without using bedrails?: A Little Help needed moving to and from a bed to a chair (including a wheelchair)?: A Little Help needed standing up from a chair using your arms (e.g., wheelchair or bedside chair)?: A Little Help needed to walk in hospital room?: A Little Help needed climbing 3-5 steps with a railing? : A Little 6 Click Score: 18    End of Session Equipment Utilized During Treatment: Gait belt Activity Tolerance: Patient tolerated treatment well Patient left: in chair;with call bell/phone within reach;with chair alarm set Nurse Communication: Mobility status PT Visit Diagnosis: Other abnormalities of gait and mobility (R26.89);Muscle weakness (generalized) (M62.81)    Time: 0092-3300 PT Time Calculation (min) (ACUTE ONLY): 29 min   Charges:   PT Evaluation $PT Eval Moderate Complexity: 1 Mod PT Treatments $Gait Training: 8-22 mins        [place], PT, DPT   Acute Rehabilitation Department Pager #: [phone]  Sandra Cockayne 02/08/2021, 1:41 PM

## 2021-02-08 NOTE — Progress Notes (Signed)
Patient blood pressure has been running on the higher side for the majority of the day, anywhere between 140s to 810-254C systolic. Labetalol given about 1700 when parameters met. Blood pressures then came down to 628O systolic.   Patient has had confusion throughout the entire shift. She has seen animals in the room that were not there. She has seen smoke in the bathroom that was not there, and has had a generally confused look on her face several times throughout the day. She has been alert and oriented to self and place today. Yesterday I was the nurse for this patient, and she was alert and oriented x 4 until evening when she began showing slight confusion.  Son does report that when given Tramadol in the past, she got so confused he barely recognized her. She was given one dose of oxycodone at 0331. No pain meds given today and patient did not appear to be in any pain during this shift.  Temperature at about 1700 began running on the lower side - 96.4, 96.9, and 96.4 consecutively about 15 minutes apart. Crackles heard in bases this a.m. and audible wheezes which cleared with a breathing treatment.  Dr. Sloan Leiter paged with update on patient and passed on to next shift report of confusion with Tramadol in the past.

## 2021-02-08 NOTE — Progress Notes (Signed)
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 fib on Pradaxa, complete heart block with PPM in place, CAD, chronic diastolic CHF, COPD, moderate aortic insufficiency, HLD, HTN, bilateral renal artery stenosis presents to the ED via EMS for evaluation of severe chest pain and abdominal pain nausea vomiting. She was seen in the ED underwent blood work up that showed  Elevated liver enzymes (AST 763, ALT 291, alk phos 229, T bili 3.3).  Transaminitis significantly worse compared to labs done 2 years ago.  Lipase normal, chest x-ray mild basilar atelectasis, CTA chest/abdomen/pelvis increase in infrarenal abdominal aortic aneurysm to 6.2 cm from 5.8 cm no finding to suggest imminent rupture , stable 4.2 cm ascending thoracic aortic aneurysm.  Vascular surgery were consulted and patient again declined any surgical intervention of her aneurysm.  Patient was admitted for further management, lft worsening- and having ongoign abdomen pain- gi and palliative consulted.  Underwent ERCP and found to have purulent cholangitis on 9/27.   Assessment & Plan:   Principal Problem:   Choledocholithiasis with obstruction Active Problems:   HTN (hypertension)   CAD, moderate   Chest pain   Complete heart block (HCC)   AAA (abdominal aortic aneurysm) (HCC)   Elevated liver enzymes   Pain in the abdomen   Ascending cholangitis  Abdominal pain/epigastric and lower chest pain Abnormal LFTs/liver dysfunction Choledocholithiasis symptomatic Purulent cholangitis Status post ERCP and sphincterotomy 9/27 Blood cultures negative so far.  Intraoperative cultures not sent. Currently on IV Unasyn.  Since culture data not available, will treat for 7 days of antibiotic therapy.   Change to oral antibiotic on discharge. Discontinue IV fluids today.  Continue oral and IV pain medications.  Pepcid. Daily monitoring of LFTs. Advance to regular diet.  Laxatives. Bleeding with hard stool with history of hemorrhoids.  Additional laxatives and stool softener today.  Acute metabolic encephalopathy multifactorial in the setting of liver dysfunction, ongoing pain, due to pain medications.keep on supportive care, fall precaution and delirium precautions.  PT OT today.  Infrarenal AAA increasing 6.2 cm from 5.8 cm: Again seen by vascular surgery and patient has declined surgical intervention, advised outpatient follow-up.    AKI on CKD stage IIIa: Baseline creatinine 0.9-1.  Urine output is adequate.  Slight improvement of renal functions today.  Anticipate complete improvement.  Intake and output monitoring.   Chronic diastolic CHF euvolemic, on the dry side hold diuretics,    CAD HTN HLD Troponin 29, monitor.EKG interpretation limited due to paced rhythm.  Resume home antihypertensives including Imdur and metoprolol.  Blood pressures better after resuming home medications.  Persistent A. Fib: History of failed ablation and antiarrhythmics with pacemaker in place, holding Pradaxa and holding anticoagulation until medically stabilized.   Complete heart block s/p PPM, stable.   COPD no signs of exacerbation, complaint of wheezing at night.  On albuterol.   Other incidental finding as below Stable thoracic ascending aortic aneurysm 4.2 cm-outpatient follow-up Cardiomegaly noted in the CT Pulmonary hypertension noted in CT scan showing enlarged bilateral pulmonary arteries Indeterminate bilateral 1.1 cm renal lesions Stable 2.8 cm left angiomyolipoma Diffuse colonic diverticulosis without diverticulitis   Goals of care:  DNR.  Agreed for ERCP.   Anticipated some medical stabilization to go home.   Palliative following.  DVT prophylaxis:   SCDs   Code  Status: DNR Family Communication: Son on the phone 9/28. Disposition Plan: Status is: Inpatient  Remains inpatient appropriate because:Inpatient level of care appropriate due to severity of illness  Dispo: The patient is from: Home              Anticipated d/c is to: SNF versus home with home health and 24/7 care.              Patient currently is not medically stable to d/c.   Difficult to place patient No         Consultants:  GI Vascular surgery Palliative  Procedures:  ERCP 9/27  Antimicrobials:  Unasyn 9/26---   Subjective: Patient seen and examined.  She tells me that she has no problem.  Mild soreness in the abdomen but denies any other complaints.  She denies any nausea or vomiting.  Eating regular diet. Still has hard stool.  No more bleeding from the bowel movements.  No family at the bedside.  Objective: Vitals:   02/07/21 2344 02/08/21 0802 02/08/21 1200 02/08/21 1213  BP: 139/83   (!) 156/103  Pulse: 71 69  78  Resp: 20     Temp: 97.8 F (36.6 C) (!) 97.5 F (36.4 C) (!) 97.5 F (36.4 C)   TempSrc: Oral Oral Oral   SpO2: 94% 93%  93%  Weight:      Height:        Intake/Output Summary (Last 24 hours) at 02/08/2021 1313 Last data filed at 02/08/2021 0900 Gross per 24 hour  Intake 2283.41 ml  Output 200 ml  Net 2083.41 ml   Filed Weights   02/04/21 1706 02/06/21 0724  Weight: 56.7 kg 56.7 kg    Examination:  General: Frail and debilitated.  Appropriate for age.  Looks generally weak and chronically sick.   Cardiovascular: S1-S2 normal.  Regular rate rhythm.  No edema.  Patient has a pacemaker in place. Respiratory: Bilateral clear.  Some conducted airway sounds. Gastrointestinal: Soft.  Mild tenderness along the epigastrium without rigidity or guarding.  Bowel sounds present. Ext: No cyanosis.  No edema.   Data Reviewed: I have personally reviewed following labs and imaging studies  CBC: Recent Labs  Lab 02/04/21 1714 02/06/21 0410  02/07/21 0303 02/08/21 0030  WBC 7.9 17.0* 9.7 9.6  NEUTROABS 7.1  --   --  8.0*  HGB 15.0 14.8 14.1 13.2  HCT 45.8 44.5 41.5 38.9  MCV 99.8 98.5 95.8 95.6  PLT 146* PLATELET CLUMPS NOTED ON SMEAR, UNABLE TO ESTIMATE 90* 97*   Basic Metabolic Panel: Recent Labs  Lab 02/04/21 1714 02/05/21 0422 02/06/21 0410 02/07/21 0033 02/08/21 0030  NA 136 138 138 136 136  K 4.0 3.8 4.6 4.1 4.9  CL 100 98 103 106 106  CO2 _0 19* 19*  GLUCOSE 109* 89 110* 93 119*  BUN 21 25* 43* 52* 58*  CREATININE 1.05* 1.58* 2.04* 2.45* 2.33*  CALCIUM 9.0 9.6 8.6* 7.9* 7.9*  MG  --   --   --   --  2.4  PHOS  --   --   --   --  3.5   GFR: Estimated Creatinine Clearance: 11.9 mL/min (A) (by C-G formula based on SCr of 2.33 mg/dL (H)). Liver Function Tests: Recent Labs  Lab 02/04/21 1714 02/05/21 0422 02/06/21 0410 02/07/21 0033 02/08/21 0030  AST 763* 703* 264* 110* 85*  ALT 291* 457* 254* 145* 106*  ALKPHOS 229* 343* 286* 257* 314*  BILITOT 3.3* 7.5* 7.6* 6.4* 4.8*  PROT 6.2* 7.0 6.0* 5.4* 5.3*  ALBUMIN 3.6 3.8 3.1* 2.9* 2.6*   Recent Labs  Lab 02/04/21 1714  LIPASE 42   No results for input(s): AMMONIA in the last 168 hours. Coagulation Profile: Recent Labs  Lab 02/05/21 0030 02/06/21 0126  INR 1.4* 1.9*   Cardiac Enzymes: No results for input(s): CKTOTAL, CKMB, CKMBINDEX, TROPONINI in the last 168 hours. BNP (last 3 results) No results for input(s): PROBNP in the last 8760 hours. HbA1C: No results for input(s): HGBA1C in the last 72 hours. CBG: No results for input(s): GLUCAP in the last 168 hours. Lipid Profile: No results for input(s): CHOL, HDL, LDLCALC, TRIG, CHOLHDL, LDLDIRECT in the last 72 hours. Thyroid Function Tests: No results for input(s): TSH, T4TOTAL, FREET4, T3FREE, THYROIDAB in the last 72 hours. Anemia Panel: No results for input(s): VITAMINB12, FOLATE, FERRITIN, TIBC, IRON, RETICCTPCT in the last 72 hours. Sepsis Labs: No results for input(s):  PROCALCITON, LATICACIDVEN in the last 168 hours.  Recent Results (from the past 240 hour(s))  Resp Panel by RT-PCR (Flu A&B, Covid) Nasopharyngeal Swab     Status: None   Collection Time: 02/04/21  9:40 PM   Specimen: Nasopharyngeal Swab; Nasopharyngeal(NP) swabs in vial transport medium  Result Value Ref Range Status   SARS Coronavirus 2 by RT PCR NEGATIVE NEGATIVE Final    Comment: (NOTE) SARS-CoV-2 target nucleic acids are NOT DETECTED.  The SARS-CoV-2 RNA is generally detectable in upper respiratory specimens during the acute phase of infection. The lowest concentration of SARS-CoV-2 viral copies this assay can detect is 138 copies/mL. A negative result does not preclude SARS-Cov-2 infection and should not be used as the sole basis for treatment or other patient management decisions. A negative result may occur with  improper specimen collection/handling, submission of specimen other than nasopharyngeal swab, presence of viral mutation(s) within the areas targeted by this assay, and inadequate number of viral copies(<138 copies/mL). A negative result must be combined with clinical observations, patient history, and epidemiological information. The expected result is Negative.  Fact Sheet for Patients:  EntrepreneurPulse.com.au  Fact Sheet for Healthcare Providers:  IncredibleEmployment.be  This test is no t yet approved or cleared by the [place] FDA and  has been authorized for detection and/or diagnosis of SARS-CoV-2 by FDA under an Emergency Use Authorization (EUA). This EUA will remain  in effect (meaning this test can be used) for the duration of the COVID-19 declaration under Section 564(b)(1) of the Act, 21 U.S.C.section 360bbb-3(b)(1), unless the authorization is terminated  or revoked sooner.       Influenza A by PCR NEGATIVE NEGATIVE Final   Influenza B by PCR NEGATIVE NEGATIVE Final    Comment: (NOTE) The Xpert Xpress  SARS-CoV-2/FLU/RSV plus assay is intended as an aid in the diagnosis of influenza from Nasopharyngeal swab specimens and should not be used as a sole basis for treatment. Nasal washings and aspirates are unacceptable for Xpert Xpress SARS-CoV-2/FLU/RSV testing.  Fact Sheet for Patients: EntrepreneurPulse.com.au  Fact Sheet for Healthcare Providers: IncredibleEmployment.be  This test is not yet approved or cleared by the [place] FDA and has been authorized for detection and/or diagnosis of SARS-CoV-2 by FDA under an Emergency Use Authorization (EUA). This EUA will remain in effect (meaning this test can be used) for the duration of the COVID-19 declaration under Section 564(b)(1) of the Act, 21 U.S.C. section 360bbb-3(b)(1), unless the authorization is terminated or  revoked.  Performed at [facility], [place] [address], [place], [place] [address]   MRSA Next Gen by PCR, Nasal     Status: None   Collection Time: 02/05/21  5:08 PM   Specimen: Nasal Mucosa; Nasal Swab  Result Value Ref Range Status   MRSA by PCR Next Gen NOT DETECTED NOT DETECTED Final    Comment: (NOTE) The GeneXpert MRSA Assay (FDA approved for NASAL specimens only), is one component of a comprehensive MRSA colonization surveillance program. It is not intended to diagnose MRSA infection nor to guide or monitor treatment for MRSA infections. Test performance is not FDA approved in patients less than 35 years old. Performed at [facility], [place] [address] [place], [place] [address]          Radiology Studies: No results found.      Scheduled Meds:  docusate sodium  100 mg Oral BID   famotidine (PEPCID) IV  20 mg Intravenous Q24H   isosorbide mononitrate  15 mg Oral Daily   metoprolol succinate  50 mg Oral BID   polyethylene glycol  17 g Oral BID   Continuous Infusions:  ampicillin-sulbactam (UNASYN) IV 1.5 g (02/08/21 1020)     LOS: 3 days     Time spent: 32 minutes    Barb Merino, MD [facility] Pager [phone]

## 2021-02-09 DIAGNOSIS — K8309 Other cholangitis: Secondary | ICD-10-CM

## 2021-02-09 DIAGNOSIS — I714 Abdominal aortic aneurysm, without rupture, unspecified: Secondary | ICD-10-CM

## 2021-02-09 LAB — CBC WITH DIFFERENTIAL/PLATELET
Abs Immature Granulocytes: 0.06 10*3/uL (ref 0.00–0.07)
Basophils Absolute: 0 10*3/uL (ref 0.0–0.1)
Basophils Relative: 0 %
Eosinophils Absolute: 0 10*3/uL (ref 0.0–0.5)
Eosinophils Relative: 1 %
HCT: 41 % (ref 36.0–46.0)
Hemoglobin: 14 g/dL (ref 12.0–15.0)
Immature Granulocytes: 1 %
Lymphocytes Relative: 12 %
Lymphs Abs: 0.9 10*3/uL (ref 0.7–4.0)
MCH: 32.3 pg (ref 26.0–34.0)
MCHC: 34.1 g/dL (ref 30.0–36.0)
MCV: 94.5 fL (ref 80.0–100.0)
Monocytes Absolute: 0.8 10*3/uL (ref 0.1–1.0)
Monocytes Relative: 10 %
Neutro Abs: 5.6 10*3/uL (ref 1.7–7.7)
Neutrophils Relative %: 76 %
Platelets: 91 10*3/uL — ABNORMAL LOW (ref 150–400)
RBC: 4.34 MIL/uL (ref 3.87–5.11)
RDW: 14.1 % (ref 11.5–15.5)
WBC: 7.3 10*3/uL (ref 4.0–10.5)
nRBC: 0 % (ref 0.0–0.2)

## 2021-02-09 LAB — COMPREHENSIVE METABOLIC PANEL
ALT: 86 U/L — ABNORMAL HIGH (ref 0–44)
AST: 47 U/L — ABNORMAL HIGH (ref 15–41)
Albumin: 2.6 g/dL — ABNORMAL LOW (ref 3.5–5.0)
Alkaline Phosphatase: 322 U/L — ABNORMAL HIGH (ref 38–126)
Anion gap: 11 (ref 5–15)
BUN: 45 mg/dL — ABNORMAL HIGH (ref 8–23)
CO2: 20 mmol/L — ABNORMAL LOW (ref 22–32)
Calcium: 8 mg/dL — ABNORMAL LOW (ref 8.9–10.3)
Chloride: 108 mmol/L (ref 98–111)
Creatinine, Ser: 1.75 mg/dL — ABNORMAL HIGH (ref 0.44–1.00)
GFR, Estimated: 27 mL/min — ABNORMAL LOW (ref >60)
Glucose, Bld: 110 mg/dL — ABNORMAL HIGH (ref 70–99)
Potassium: 3.9 mmol/L (ref 3.5–5.1)
Sodium: 139 mmol/L (ref 135–145)
Total Bilirubin: 4.3 mg/dL — ABNORMAL HIGH (ref 0.3–1.2)
Total Protein: 5.3 g/dL — ABNORMAL LOW (ref 6.5–8.1)

## 2021-02-09 MED ORDER — FAMOTIDINE 20 MG PO TABS
20.0000 mg | ORAL_TABLET | Freq: Two times a day (BID) | ORAL | Status: DC
  Administered 2021-02-09: 20 mg via ORAL
  Filled 2021-02-09: qty 1

## 2021-02-09 MED ORDER — FAMOTIDINE 20 MG PO TABS
20.0000 mg | ORAL_TABLET | Freq: Every day | ORAL | Status: DC
  Administered 2021-02-10: 20 mg via ORAL
  Filled 2021-02-09: qty 1

## 2021-02-09 MED ORDER — DABIGATRAN ETEXILATE MESYLATE 75 MG PO CAPS
75.0000 mg | ORAL_CAPSULE | Freq: Two times a day (BID) | ORAL | Status: DC
  Administered 2021-02-09 – 2021-02-10 (×3): 75 mg via ORAL
  Filled 2021-02-09 (×4): qty 1

## 2021-02-09 MED ORDER — ISOSORBIDE MONONITRATE ER 30 MG PO TB24
30.0000 mg | ORAL_TABLET | Freq: Every day | ORAL | Status: DC
  Administered 2021-02-09 – 2021-02-10 (×2): 30 mg via ORAL
  Filled 2021-02-09 (×2): qty 1

## 2021-02-09 NOTE — Progress Notes (Signed)
PROGRESS NOTE    [name]  XNA:[id number] DOB: 18-Jul-1925 DOA: 02/04/2021 PCP: Merrilee Seashore, MD    Brief Narrative:  87 year old female with significant history of infrarenal abdominal aortic aneurysm Stanford type B dissection of the descending thoracic aorta, persistent A. fib on Pradaxa, complete heart block with PPM in place, CAD, chronic diastolic CHF, COPD, moderate aortic insufficiency, HLD, HTN, bilateral renal artery stenosis presents to the ED via EMS for evaluation of severe chest pain and abdominal pain nausea vomiting. She was seen in the ED underwent blood work up that showed  Elevated liver enzymes (AST 763, ALT 291, alk phos 229, T bili 3.3).  Transaminitis significantly worse compared to labs done 2 years ago.  Lipase normal, chest x-ray mild basilar atelectasis, CTA chest/abdomen/pelvis increase in infrarenal abdominal aortic aneurysm to 6.2 cm from 5.8 cm no finding to suggest imminent rupture , stable 4.2 cm ascending thoracic aortic aneurysm.  Vascular surgery were consulted and patient again declined any surgical intervention of her aneurysm.  Patient was admitted for further management, lft worsening- and having ongoign abdomen pain- gi and palliative consulted.  Underwent ERCP and found to have purulent cholangitis on 9/27.   Assessment & Plan:   Principal Problem:   Choledocholithiasis with obstruction Active Problems:   HTN (hypertension)   CAD, moderate   Chest pain   Complete heart block (HCC)   AAA (abdominal aortic aneurysm) (HCC)   Elevated liver enzymes   Pain in the abdomen   Ascending cholangitis  Abdominal pain/epigastric and lower chest pain Abnormal LFTs/liver dysfunction Choledocholithiasis symptomatic Purulent cholangitis Status post ERCP and sphincterotomy 9/27 Blood cultures negative so far.  Intraoperative cultures not sent. Currently on IV Unasyn.  Since culture data not available, will treat for 7 days of antibiotic therapy.   Change to oral antibiotic on discharge. Pepcid. Daily monitoring of LFTs.  Gradually improving. Advance to regular diet.  Laxatives. Bleeding with hard stool with history of hemorrhoids.  Additional laxatives and stool softener with improvement of bowel habits.  Acute metabolic encephalopathy multifactorial in the setting of liver dysfunction, ongoing pain, due to pain medications.keep on supportive care, fall precaution and delirium precautions.  PT OT today.  Avoiding opiates as most possible.  Infrarenal AAA increasing 6.2 cm from 5.8 cm: Again seen by vascular surgery and patient has declined surgical intervention, advised outpatient follow-up.    AKI on CKD stage IIIa: Baseline creatinine 0.9-1.  Urine output is adequate.  Gradual improvement in renal functions. Intake and output monitoring.   Chronic diastolic CHF euvolemic, on the dry side and holding diuretics.      CAD HTN HLD Troponin 29, monitor.EKG interpretation limited due to paced rhythm.  Resume home antihypertensives including Imdur and metoprolol.  Blood pressure elevated.  Increase Imdur to 30 mg daily.  Persistent A. Fib: History of failed ablation and antiarrhythmics with pacemaker in place, will resume Pradaxa today and monitor before discharging.   Complete heart block s/p PPM, stable.   COPD no signs of exacerbation, on nebulizers.   Other incidental finding as below Stable thoracic ascending aortic aneurysm 4.2 cm-outpatient follow-up Cardiomegaly noted in the CT Pulmonary hypertension noted in CT scan showing enlarged bilateral pulmonary arteries Indeterminate bilateral 1.1 cm renal lesions Stable 2.8 cm left angiomyolipoma Diffuse colonic diverticulosis without diverticulitis    Continue to work with PT OT today.  Recheck levels tomorrow morning.  If her mentation improves, she will be ready to discharge from the hospital tomorrow.  We will need  to figure out whether they can have 24/7 arrangements at  home.   DVT prophylaxis:   SCDs dabigatran (PRADAXA) capsule 75 mg   Code Status: DNR Family Communication: Called.  Unable to talk to the son. Disposition Plan: Status is: Inpatient  Remains inpatient appropriate because:Inpatient level of care appropriate due to severity of illness  Dispo: The patient is from: Home              Anticipated d/c is to: SNF versus home with home health and 24/7 care.              Patient currently is not medically stable to d/c.   Difficult to place patient No         Consultants:  GI Vascular surgery Palliative  Procedures:  ERCP 9/27  Antimicrobials:  Unasyn 9/26---   Subjective: Patient seen and examined.  Today, she remains more calm and comfortable.  No more confusion like yesterday.  Denies any abdominal pain nausea vomiting.  Last night, noted bowel movements with no blood. She tells me she feels like she is in a foreign country.  Objective: Vitals:   02/08/21 2317 02/09/21 0300 02/09/21 0732 02/09/21 0757  BP: (!) 164/101 (!) 131/95 (!) 156/101   Pulse: 70 70 72   Resp: 16 20 17  (!) 24  Temp: 97.6 F (36.4 C) 97.7 F (36.5 C) (!) 97.5 F (36.4 C)   TempSrc: Oral Oral Oral   SpO2: 97% 97% 90%   Weight:      Height:        Intake/Output Summary (Last 24 hours) at 02/09/2021 1052 Last data filed at 02/09/2021 0928 Gross per 24 hour  Intake 540 ml  Output 350 ml  Net 190 ml   Filed Weights   02/04/21 1706 02/06/21 0724  Weight: 56.7 kg 56.7 kg    Examination:  General: Frail and debilitated.  Not in any distress.  On room air. Cardiovascular: S1-S2 normal.  Regular rate rhythm.  No edema.  Patient has a pacemaker in place. Respiratory: Bilateral clear.  Some conducted airway sounds. Gastrointestinal: Soft.  No tenderness.  Bowel sounds present. Ext: No cyanosis.  No edema.   Data Reviewed: I have personally reviewed following labs and imaging studies  CBC: Recent Labs  Lab 02/04/21 1714 02/06/21 0410  02/07/21 0303 02/08/21 0030 02/09/21 0030  WBC 7.9 17.0* 9.7 9.6 7.3  NEUTROABS 7.1  --   --  8.0* 5.6  HGB 15.0 14.8 14.1 13.2 14.0  HCT 45.8 44.5 41.5 38.9 41.0  MCV 99.8 98.5 95.8 95.6 94.5  PLT 146* PLATELET CLUMPS NOTED ON SMEAR, UNABLE TO ESTIMATE 90* 97* 91*   Basic Metabolic Panel: Recent Labs  Lab 02/05/21 0422 02/06/21 0410 02/07/21 0033 02/08/21 0030 02/09/21 0030  NA 138 138 136 136 139  K 3.8 4.6 4.1 4.9 3.9  CL 98 103 106 106 108  CO2 23 23 19* 19* 20*  GLUCOSE 89 110* 93 119* 110*  BUN 25* 43* 52* 58* 45*  CREATININE 1.58* 2.04* 2.45* 2.33* 1.75*  CALCIUM 9.6 8.6* 7.9* 7.9* 8.0*  MG  --   --   --  2.4  --   PHOS  --   --   --  3.5  --    GFR: Estimated Creatinine Clearance: 15.9 mL/min (A) (by C-G formula based on SCr of 1.75 mg/dL (H)). Liver Function Tests: Recent Labs  Lab 02/05/21 0422 02/06/21 0410 02/07/21 0033 02/08/21 0030 02/09/21 0030  AST  703* 264* 110* 85* 47*  ALT 457* 254* 145* 106* 86*  ALKPHOS 343* 286* 257* 314* 322*  BILITOT 7.5* 7.6* 6.4* 4.8* 4.3*  PROT 7.0 6.0* 5.4* 5.3* 5.3*  ALBUMIN 3.8 3.1* 2.9* 2.6* 2.6*   Recent Labs  Lab 02/04/21 1714  LIPASE 42   No results for input(s): AMMONIA in the last 168 hours. Coagulation Profile: Recent Labs  Lab 02/05/21 0030 02/06/21 0126  INR 1.4* 1.9*   Cardiac Enzymes: No results for input(s): CKTOTAL, CKMB, CKMBINDEX, TROPONINI in the last 168 hours. BNP (last 3 results) No results for input(s): PROBNP in the last 8760 hours. HbA1C: No results for input(s): HGBA1C in the last 72 hours. CBG: No results for input(s): GLUCAP in the last 168 hours. Lipid Profile: No results for input(s): CHOL, HDL, LDLCALC, TRIG, CHOLHDL, LDLDIRECT in the last 72 hours. Thyroid Function Tests: No results for input(s): TSH, T4TOTAL, FREET4, T3FREE, THYROIDAB in the last 72 hours. Anemia Panel: No results for input(s): VITAMINB12, FOLATE, FERRITIN, TIBC, IRON, RETICCTPCT in the last 72  hours. Sepsis Labs: No results for input(s): PROCALCITON, LATICACIDVEN in the last 168 hours.  Recent Results (from the past 240 hour(s))  Resp Panel by RT-PCR (Flu A&B, Covid) Nasopharyngeal Swab     Status: None   Collection Time: 02/04/21  9:40 PM   Specimen: Nasopharyngeal Swab; Nasopharyngeal(NP) swabs in vial transport medium  Result Value Ref Range Status   SARS Coronavirus 2 by RT PCR NEGATIVE NEGATIVE Final    Comment: (NOTE) SARS-CoV-2 target nucleic acids are NOT DETECTED.  The SARS-CoV-2 RNA is generally detectable in upper respiratory specimens during the acute phase of infection. The lowest concentration of SARS-CoV-2 viral copies this assay can detect is 138 copies/mL. A negative result does not preclude SARS-Cov-2 infection and should not be used as the sole basis for treatment or other patient management decisions. A negative result may occur with  improper specimen collection/handling, submission of specimen other than nasopharyngeal swab, presence of viral mutation(s) within the areas targeted by this assay, and inadequate number of viral copies(<138 copies/mL). A negative result must be combined with clinical observations, patient history, and epidemiological information. The expected result is Negative.  Fact Sheet for Patients:  EntrepreneurPulse.com.au  Fact Sheet for Healthcare Providers:  IncredibleEmployment.be  This test is no t yet approved or cleared by the [place] FDA and  has been authorized for detection and/or diagnosis of SARS-CoV-2 by FDA under an Emergency Use Authorization (EUA). This EUA will remain  in effect (meaning this test can be used) for the duration of the COVID-19 declaration under Section 564(b)(1) of the Act, 21 U.S.C.section 360bbb-3(b)(1), unless the authorization is terminated  or revoked sooner.       Influenza A by PCR NEGATIVE NEGATIVE Final   Influenza B by PCR NEGATIVE  NEGATIVE Final    Comment: (NOTE) The Xpert Xpress SARS-CoV-2/FLU/RSV plus assay is intended as an aid in the diagnosis of influenza from Nasopharyngeal swab specimens and should not be used as a sole basis for treatment. Nasal washings and aspirates are unacceptable for Xpert Xpress SARS-CoV-2/FLU/RSV testing.  Fact Sheet for Patients: EntrepreneurPulse.com.au  Fact Sheet for Healthcare Providers: IncredibleEmployment.be  This test is not yet approved or cleared by the [place] FDA and has been authorized for detection and/or diagnosis of SARS-CoV-2 by FDA under an Emergency Use Authorization (EUA). This EUA will remain in effect (meaning this test can be used) for the duration of the COVID-19 declaration under Section 564(b)(1)  of the Act, 21 U.S.C. section 360bbb-3(b)(1), unless the authorization is terminated or revoked.  Performed at [facility], [place] [address] [place], [place] [address]   MRSA Next Gen by PCR, Nasal     Status: None   Collection Time: 02/05/21  5:08 PM   Specimen: Nasal Mucosa; Nasal Swab  Result Value Ref Range Status   MRSA by PCR Next Gen NOT DETECTED NOT DETECTED Final    Comment: (NOTE) The GeneXpert MRSA Assay (FDA approved for NASAL specimens only), is one component of a comprehensive MRSA colonization surveillance program. It is not intended to diagnose MRSA infection nor to guide or monitor treatment for MRSA infections. Test performance is not FDA approved in patients less than 57 years old. Performed at [facility], [place] [address], [place], [place] [address]          Radiology Studies: No results found.      Scheduled Meds:  dabigatran  75 mg Oral Q12H   docusate sodium  100 mg Oral BID   famotidine  20 mg Oral BID   isosorbide mononitrate  30 mg Oral Daily   metoprolol succinate  50 mg Oral BID   polyethylene glycol  17 g Oral BID   Continuous Infusions:   ampicillin-sulbactam (UNASYN) IV 1.5 g (02/09/21 0906)     LOS: 4 days    Time spent: 30 minutes    Barb Merino, MD [facility] Pager [phone]

## 2021-02-09 NOTE — Progress Notes (Signed)
Physical Therapy Treatment Patient Details Name: [name] MRN: [id number] DOB: 04-Dec-1925 Today's Date: 02/09/2021   History of Present Illness The pt is a 87 yo female presenting 9/25 with chest pain, nausea, and vomiting. CTA revealed increased size of AAA, pt declined surgical intervention. Pt now s/p ERCP 9/27 and found to have purulent cholangitis. PMH includes: persistent A. fib on Pradaxa, complete heart block with PPM in place, CAD, chronic diastolic CHF, COPD, moderate aortic insufficiency, HLD, HTN, bilateral renal artery stenosis, and CKD IIIb.    PT Comments    The pt was able to demo inconsistent improvement in cognition this session as she remains oriented when asked orientation questions, but needs increased cues, repeated instructions, and increased processing time with mobility. The pt continues to require minA with ambulation due to weakness and dynamic instability. Furthermore, the pt demos poor insight to self-monitoring of exertion, and benefits from increased cueing and prompting to safely determine her limits. Continue to recommend 24/7 assist and supervision at home for safest possible discharge due to pt's current increased risk of falls and decreased insight to safety.     Recommendations for follow up therapy are one component of a multi-disciplinary discharge planning process, led by the attending physician.  Recommendations may be updated based on patient status, additional functional criteria and insurance authorization.  Follow Up Recommendations  Home health PT;Supervision/Assistance - 24 hour     Equipment Recommendations  None recommended by PT    Recommendations for Other Services       Precautions / Restrictions Precautions Precautions: Fall Precaution Comments: HOH Restrictions Weight Bearing Restrictions: No     Mobility  Bed Mobility Overal bed mobility: Needs Assistance Bed Mobility: Supine to Sit     Supine to sit: Supervision      General bed mobility comments: no assist, HOB elevated    Transfers Overall transfer level: Needs assistance Equipment used: Rolling walker (2 wheeled) Transfers: Sit to/from Stand Sit to Stand: Min guard         General transfer comment: minG to rise from EOB and low toilet, pt using BUE on grab bars, but able to rise and steady  Ambulation/Gait Ambulation/Gait assistance: Min assist Gait Distance (Feet): 125 Feet Assistive device: Rolling walker (2 wheeled) Gait Pattern/deviations: Step-through pattern;Decreased stride length;Staggering left;Staggering right Gait velocity: 0.22 m/s Gait velocity interpretation: <1.31 ft/sec, indicative of household ambulator General Gait Details: pt with decreased frequency of staggering steps, but still needing intermittent increased assist due to knee buckling, cues for RW proximity and energy conservation      Balance Overall balance assessment: Mild deficits observed, not formally tested                                          Cognition Arousal/Alertness: Awake/alert Behavior During Therapy: WFL for tasks assessed/performed Overall Cognitive Status: Impaired/Different from baseline Area of Impairment: Following commands;Problem solving;Safety/judgement;Orientation                 Orientation Level:  (pt answers questions correctly, but at times gives slightly off answers such as answering "I wish a golf course" when asked where we were today, then stating she does not like or play golf) Current Attention Level: Focused   Following Commands: Follows one step commands inconsistently;Follows one step commands with increased time Safety/Judgement: Decreased awareness of safety;Decreased awareness of deficits   Problem Solving: Slow processing;Requires  verbal cues General Comments: pt intermittently needing increased cues for instructions or to process questions, but at other times making appropriate jokes with  no increased time. cues for safety throughout         General Comments General comments (skin integrity, edema, etc.): VSS on RA. pt moaning and grimacing through session, but states "I'm okay" every time PT asked      Pertinent Vitals/Pain Pain Assessment: Faces Faces Pain Scale: Hurts a little bit Pain Location: with bowel movement Pain Descriptors / Indicators: Grimacing Pain Intervention(s): Limited activity within patient's tolerance;Monitored during session;Repositioned     PT Goals (current goals can now be found in the care plan section) Acute Rehab PT Goals Patient Stated Goal: return home PT Goal Formulation: With patient Time For Goal Achievement: 02/22/21 Potential to Achieve Goals: Good Progress towards PT goals: Progressing toward goals    Frequency    Min 3X/week      PT Plan Current plan remains appropriate       AM-PAC PT "6 Clicks" Mobility   Outcome Measure  Help needed turning from your back to your side while in a flat bed without using bedrails?: A Little Help needed moving from lying on your back to sitting on the side of a flat bed without using bedrails?: A Little Help needed moving to and from a bed to a chair (including a wheelchair)?: A Little Help needed standing up from a chair using your arms (e.g., wheelchair or bedside chair)?: A Little Help needed to walk in hospital room?: A Little Help needed climbing 3-5 steps with a railing? : A Little 6 Click Score: 18    End of Session Equipment Utilized During Treatment: Gait belt Activity Tolerance: Patient tolerated treatment well Patient left: in chair;with call bell/phone within reach;with chair alarm set Nurse Communication: Mobility status PT Visit Diagnosis: Other abnormalities of gait and mobility (R26.89);Muscle weakness (generalized) (M62.81)     Time: 4982-6415 PT Time Calculation (min) (ACUTE ONLY): 26 min  Charges:  $Gait Training: 8-22 mins $Therapeutic Activity: 8-22  mins                     West Carbo, PT, DPT   Acute Rehabilitation Department Pager #: [phone]   Sandra Cockayne 02/09/2021, 10:33 AM

## 2021-02-09 NOTE — Progress Notes (Signed)
Daily Progress Note   Patient Name: [name]       Date: 02/09/2021 DOB: March 17, 1926  Age: 87 y.o. MRN#: [id number] Attending Physician: Barb Merino, MD Primary Care Physician: Merrilee Seashore, MD Admit Date: 02/04/2021  Reason for Consultation/Follow-up: Disposition and Establishing goals of care  Subjective: Chart review performed. Received report from primary RN - no acute concerns. RN states patient's mentation has improved today; however, she still remains intermittently confused. PT will be by to reassess patient today.   Went to visit patient at bedside - no family/visitors present. Patient was lying in bed asleep - she does wake to voice/gentle touch. She is oriented x4 and able to participate in conversation. Denies pain or shortness of breath. She states she feels "much better" than when she came to hospital. No signs or non-verbal gestures of pain or discomfort noted. No respiratory distress, increased work of breathing, or secretions noted.   I attempted to discuss her goals for discharge, which includes SNF vs home with [facility] and increased [facility] support - she requests I speak with her son/Jeff, which I will.   Called son/Jeff - emotional support provided. Reviewed PT/OT recommendations, which include HHPT with 24/7 supervision/assistance. Merry Proud is looking into patient's LTC insurance benefit. He also has talked with Alvis Lemmings about increasing patient's private duty caregiving hours. As of now, he plans for patient to return home with HHPT, increased caregiver hours with Alvis Lemmings, and he will supplement time [facility] staff not present.   Explained and offered outpatient Palliative Care - he declines.  All questions and concerns addressed. Encouraged to call with questions and/or  concerns. PMT card previously provided.    Length of Stay: 4  Current Medications: Scheduled Meds:   dabigatran  75 mg Oral Q12H   docusate sodium  100 mg Oral BID   famotidine  20 mg Oral BID   isosorbide mononitrate  30 mg Oral Daily   metoprolol succinate  50 mg Oral BID   polyethylene glycol  17 g Oral BID    Continuous Infusions:  ampicillin-sulbactam (UNASYN) IV 1.5 g (02/09/21 0906)    PRN Meds: ipratropium-albuterol, labetalol, prochlorperazine, sodium chloride  Physical Exam Vitals and nursing note reviewed.  Constitutional:      General: She is not in acute distress.    Comments: Frail appearing  Pulmonary:  Effort: No respiratory distress.  Skin:    General: Skin is warm and dry.  Neurological:     Mental Status: She is alert and oriented to person, place, and time.     Motor: Weakness present.  Psychiatric:        Attention and Perception: Attention normal.        Behavior: Behavior is cooperative.        Cognition and Memory: Cognition and memory normal.            Vital Signs: BP (!) 156/101 (BP Location: Right Arm)   Pulse 72   Temp (!) 97.5 F (36.4 C) (Oral)   Resp (!) 24   Ht 5\' 2"  (1.575 m)   Wt 56.7 kg   SpO2 90%   BMI 22.86 kg/m  SpO2: SpO2: 90 % O2 Device: O2 Device: Room Air O2 Flow Rate: O2 Flow Rate (L/min): 2 L/min  Intake/output summary:  Intake/Output Summary (Last 24 hours) at 02/09/2021 1100 Last data filed at 02/09/2021 8921 Gross per 24 hour  Intake 540 ml  Output 350 ml  Net 190 ml   LBM: Last BM Date: 02/09/21 Baseline Weight: Weight: 56.7 kg Most recent weight: Weight: 56.7 kg       Palliative Assessment/Data: PPS 40-50%    Flowsheet Rows    Flowsheet Row Most Recent Value  Intake Tab   Referral Department Hospitalist  Unit at Time of Referral ER  Palliative Care Primary Diagnosis Cardiac  Date Notified 02/05/21  Palliative Care Type New Palliative care  Reason for referral Clarify Goals of Care  Date  of Admission 02/04/21  Date first seen by Palliative Care 02/05/21  # of days Palliative referral response time 0 Day(s)  # of days IP prior to Palliative referral 1  Clinical Assessment   Psychosocial & Spiritual Assessment   Palliative Care Outcomes   Patient/Family meeting held? Yes  Who was at the meeting? son  Palliative Care Outcomes Clarified goals of care, Counseled regarding hospice, Provided psychosocial or spiritual support  Patient/Family wishes: Interventions discontinued/not started  Mechanical Ventilation, Vasopressors, NIPPV, Trach, Transfer out of ICU, BiPAP, Antibiotics, PEG, Transfusion, Tube feedings/TPN       Patient Active Problem List   Diagnosis Date Noted   Ascending cholangitis    Choledocholithiasis with obstruction 02/06/2021   Pain in the abdomen 02/05/2021   Elevated LFTs    Transaminitis    History of gallstones    AAA (abdominal aortic aneurysm) ([place]) 02/04/2021   Elevated liver enzymes 02/04/2021   Nonrheumatic aortic valve insufficiency 04/24/2018   Chronic diastolic heart failure ([place]) 04/24/2018   Renovascular hypertension 04/24/2018   Ascending aortic aneurysm ([place]) 04/24/2018   Dissection of descending thoracic aorta ([place]) 04/24/2018   Hyperkalemia 04/24/2018   Aortic aneurysm ([place]) 10/23/2017   Nonrheumatic aortic (valve) stenosis 06/25/2017   Complete heart block ([place]) 01/23/2016   Current use of long term anticoagulation 05/04/2015   Abnormal myocardial perfusion study    Hypertensive heart disease without heart failure    Coronary artery disease involving native coronary artery of native heart without angina pectoris    Chest pain 04/19/2015   AAA (abdominal aortic aneurysm) without rupture (HCC)    Pelvic fracture ([place]) 09/16/2014   Fracture of left superior pubic ramus ([place]) 09/14/2014   Contusion of forehead 09/14/2014   Calculus of bile duct without mention of cholecystitis or obstruction 06/23/2013   Pacemaker 12/07/2012    Hypercholesterolemia 12/07/2012   Cerebral embolism with cerebral  infarction ([place]) 01/22/2012   Chest pain of pericarditis 01/20/2012   Acute CHF, presumed secondary to Rt heart failure 01/20/2012   Near syncope 01/17/2012   Cardiac tamponade, recurrent episode, admitted 9/5, but now felt to be pericarditits 01/17/2012   Longstanding persistent atrial fibrillation ([place]) 01/05/2012   CAD, moderate 09/05/2011   COPD, by CXR, followed by Dr Annamaria Boots 09/05/2011   Syncope, after NTG X 1 on admission 09/05/2011   HTN (hypertension) 09/03/2011   Dyspnea on exertion, secondary to AF 09/03/2011   RBBB, intermittent 09/03/2011   Personal history of colonic polyps 06/21/2011   Right bundle branch block with left anterior fascicular block 03/19/2011   Allergic rhinitis due to pollen 07/17/2010   GERD 10/25/2009   ABDOMINAL PAIN-EPIGASTRIC 10/25/2009   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   EUSTACHIAN TUBE DYSFUNCTION 11/26/2007   DIVERTICULOSIS, COLON 07/03/2006    Palliative Care Assessment & Plan   Patient Profile: Taken from H&P: 87 y.o. 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 fib on Pradaxa, complete heart block status post PPM, CAD, chronic diastolic CHF, COPD, moderate aortic insufficiency, hyperlipidemia, hypertension, bilateral renal artery stenosis.  Per chart review, patient's infrarenal abdominal aortic aneurysm has been steadily increasing in size.  Followed by Dr. Donzetta Matters from vascular surgery.  Per last vascular surgery office visit note from 08/18/2020 the aneurysm was 5.8 cm in size and was amenable to endovascular repair but patient has continued to decline surgical intervention.  Per note, patient also has small bilateral renal lesions which are concerning for renal cell carcinoma but decision was made not to pursue further evaluation.    Patient presented to the [facility] ED on  02/04/21 from home with complaints of chest and abdominal pain. She was admitted on 02/04/2021 with enlarging infrarenal abdominal aortic aneurism and elevated liver enzymes.   Assessment: Elevated LFTs with epigastric abdominal pain Recurrent choledochoithiasis causing purulent cholangitis, s/p ERCP 9/27 Enlarging infrarenal abdominal aortic aneurysm Persistent a-fib Complete heart block Chronic diastolic CHF  Recommendations/Plan: Continue current medial treatment Continue DNR/DNI as previously documented Son's plan is for patient to return home with increased private duty caregiving hours, he will supplement time to ensure patient has 24/7 supervision Son understand's plan is for likely discharge tomorrow PMT will continue to follow and support holistically   Goals of Care and Additional Recommendations: Limitations on Scope of Treatment: Full Scope Treatment  Code Status:    Code Status Orders  (From admission, onward)           Start     Ordered   02/04/21 2213  Do not attempt resuscitation (DNR)  Continuous       Question Answer Comment  In the event of cardiac or respiratory ARREST Do not call a "code blue"   In the event of cardiac or respiratory ARREST Do not perform Intubation, CPR, defibrillation or ACLS   In the event of cardiac or respiratory ARREST Use medication by any route, position, wound care, and other measures to relive pain and suffering. May use oxygen, suction and manual treatment of airway obstruction as needed for comfort.   Comments Would accept short term intubation if the reson for intubation was reversible.      02/04/21 2214           Code Status History     Date Active Date Inactive Code Status Order ID Comments User Context  12/05/2017 1704 12/06/2017 1533 DNR [id number]  Rondel Jumbo, PA-C ED   10/23/2017 2346 10/27/2017 2100 Partial Code [id number]  Corey Harold, NP ED   04/24/2015 0945 04/24/2015 1757 Full Code [id number]  Belva Crome, MD Inpatient   04/19/2015 1153 04/21/2015 1618 Full Code [id number]  Waldemar Dickens, MD ED   04/19/2015 1153 04/19/2015 1153 Full Code [id number]  Waldemar Dickens, MD ED   09/16/2014 1549 09/29/2014 1632 Full Code [id number]  Bary Leriche, PA-C Inpatient   09/16/2014 1237 09/16/2014 1549 Full Code [id number]  Flora Lipps Inpatient   09/14/2014 1744 09/16/2014 1237 Full Code [id number]  Delfina Redwood, MD ED   01/16/2012 2242 01/24/2012 1748 Full Code [id number]  Sanda Klein, MD ED   01/05/2012 1228 01/07/2012 1613 Full Code [id number]  Annabell Howells, RN Inpatient       Prognosis:  Unable to determine  Discharge Planning: Home with [place] was discussed with primary RN, patient, patient's son, [facility] attending provider  Thank you for allowing the Palliative Medicine Team to assist in the care of this patient.   Total Time 30 minutes Prolonged Time Billed  no       Greater than 50%  of this time was spent counseling and coordinating care related to the above assessment and plan.  Lin Landsman, NP  Please contact Palliative Medicine Team phone at [phone] for questions and concerns.

## 2021-02-10 LAB — CBC WITH DIFFERENTIAL/PLATELET
Abs Immature Granulocytes: 0.1 10*3/uL — ABNORMAL HIGH (ref 0.00–0.07)
Basophils Absolute: 0 10*3/uL (ref 0.0–0.1)
Basophils Relative: 0 %
Eosinophils Absolute: 0.1 10*3/uL (ref 0.0–0.5)
Eosinophils Relative: 1 %
HCT: 42.1 % (ref 36.0–46.0)
Hemoglobin: 13.9 g/dL (ref 12.0–15.0)
Immature Granulocytes: 1 %
Lymphocytes Relative: 12 %
Lymphs Abs: 1.1 10*3/uL (ref 0.7–4.0)
MCH: 32 pg (ref 26.0–34.0)
MCHC: 33 g/dL (ref 30.0–36.0)
MCV: 96.8 fL (ref 80.0–100.0)
Monocytes Absolute: 1 10*3/uL (ref 0.1–1.0)
Monocytes Relative: 10 %
Neutro Abs: 7.5 10*3/uL (ref 1.7–7.7)
Neutrophils Relative %: 76 %
Platelets: 106 10*3/uL — ABNORMAL LOW (ref 150–400)
RBC: 4.35 MIL/uL (ref 3.87–5.11)
RDW: 14.4 % (ref 11.5–15.5)
WBC: 9.8 10*3/uL (ref 4.0–10.5)
nRBC: 0 % (ref 0.0–0.2)

## 2021-02-10 LAB — COMPREHENSIVE METABOLIC PANEL
ALT: 67 U/L — ABNORMAL HIGH (ref 0–44)
AST: 38 U/L (ref 15–41)
Albumin: 2.7 g/dL — ABNORMAL LOW (ref 3.5–5.0)
Alkaline Phosphatase: 336 U/L — ABNORMAL HIGH (ref 38–126)
Anion gap: 8 (ref 5–15)
BUN: 31 mg/dL — ABNORMAL HIGH (ref 8–23)
CO2: 25 mmol/L (ref 22–32)
Calcium: 8 mg/dL — ABNORMAL LOW (ref 8.9–10.3)
Chloride: 108 mmol/L (ref 98–111)
Creatinine, Ser: 1.38 mg/dL — ABNORMAL HIGH (ref 0.44–1.00)
GFR, Estimated: 36 mL/min — ABNORMAL LOW (ref >60)
Glucose, Bld: 110 mg/dL — ABNORMAL HIGH (ref 70–99)
Potassium: 4.6 mmol/L (ref 3.5–5.1)
Sodium: 141 mmol/L (ref 135–145)
Total Bilirubin: 3.6 mg/dL — ABNORMAL HIGH (ref 0.3–1.2)
Total Protein: 5.4 g/dL — ABNORMAL LOW (ref 6.5–8.1)

## 2021-02-10 MED ORDER — FAMOTIDINE 20 MG PO TABS: 20.0000 mg | ORAL_TABLET | Freq: Every day | ORAL | 0 refills | Status: DC

## 2021-02-10 MED ORDER — ISOSORBIDE MONONITRATE ER 30 MG PO TB24: 30.0000 mg | ORAL_TABLET | Freq: Every day | ORAL | 0 refills | Status: DC

## 2021-02-10 MED ORDER — ACETAMINOPHEN 500 MG PO TABS: 500.0000 mg | ORAL_TABLET | Freq: Four times a day (QID) | ORAL | 2 refills | Status: AC | PRN

## 2021-02-10 NOTE — TOC Transition Note (Signed)
Transition of Care [facility]) - CM/SW Discharge Note   Patient Details  Name: Paizlee Kinder MRN: [id number] Date of Birth: Mar 21, 1926  Transition of Care [facility]) CM/SW Contact:  Zenon Mayo, RN Phone Number: 02/10/2021, 10:54 AM   Clinical Narrative:    Patient is for dc today, NCM spoke with son , he will be transporting her home.  NCM offered choice to son, he states they have [place] and would like to continue with them  for HHPT, [place], [place].  They also has an aide with [employer] from 8 am to 8 pm  starting tomorrow, and the son will be with patient after 8 pm. She has transfer bench, 3  n 1, and grab bars at home.      Final next level of care: [place] Barriers to Discharge: No Barriers Identified   Patient Goals and CMS Choice Patient states their goals for this hospitalization and ongoing recovery are:: return home with [facility] CMS Medicare.gov Compare Post Acute Care list provided to:: Patient Represenative (must comment) Choice offered to / list presented to : Adult Children  Discharge Placement                       Discharge Plan and Services In-house Referral: NA Discharge Planning Services: CM Consult              DME Agency: NA       HH Arranged: PT, OT, Nurse's Aide [place] Agency: [place] Date [facility] Agency Contacted: 02/10/21 Time Jarrah Seher: [phone] Representative spoke with at [place]: [place] ([place]) Interventions     Readmission Risk Interventions Readmission Risk Prevention Plan 02/10/2021  Transportation Screening Complete  PCP or Specialist Appt within 3-5 Days Complete  HRI or [place] Complete  Social Work Consult for [place] Planning/Counseling Complete  Palliative Care Screening Not Applicable  Medication Review Press photographer) Complete  Some recent data might be hidden

## 2021-02-10 NOTE — Progress Notes (Signed)
Pt got discharged to home, discharge instructions provided and patient showed understanding to it, IV taken out,Telemonitor DC,pt left unit in wheelchair with all of the belongings accompanied with a family member (Son)  Kvion Shapley, RN 

## 2021-02-10 NOTE — TOC Initial Note (Signed)
Transition of Care [facility]) - Initial/Assessment Note    Patient Details  Name: [name] MRN: [id number] Date of Birth: Nov 06, 1925  [facility]) CM/SW Contact:    Zenon Mayo, RN Phone Number: 02/10/2021, 10:52 AM  Clinical Narrative:                 Patient is for dc today, NCM spoke with son , he will be transporting her home.  NCM offered choice to son, he states they have [place] and would like to continue with them  for HHPT, [place].  They also has an aide with [facility] from 8 am to 8 pm  starting tomorrow, and the son will be with patient after 8 pm. She has transfer bench, 3  n 1, and grab bars at home.    Expected Discharge Plan: [place] Barriers to Discharge: No Barriers Identified   Patient Goals and CMS Choice Patient states their goals for this hospitalization and ongoing recovery are:: return home with [facility] CMS Medicare.gov Compare Post Acute Care list provided to:: Patient Represenative (must comment) Choice offered to / list presented to : Adult Children  Expected Discharge Plan and Services Expected Discharge Plan: [place] In-house Referral: NA Discharge Planning Services: CM Consult   Living arrangements for the past 2 months: Single Family Home Expected Discharge Date: 02/10/21                 DME Agency: NA       HH Arranged: PT, OT, Nurse's Aide HH Agency: [place] Date [facility] Agency Contacted: 02/10/21 Time [place]: 19 Representative spoke with at [place]: Tommi Rumps  Prior Living Arrangements/Services Living arrangements for the past 2 months: [place] with:: Self Patient language and need for interpreter reviewed:: Yes Do you feel safe going back to the place where you live?: Yes      Need for Family Participation in Patient Care: Yes (Comment) Care giver support system in place?: Yes (comment) Current home services: DME, Homehealth aide  (transfer bench, 3 n 1, grab bars home health aide) Criminal Activity/Legal Involvement Pertinent to Current Situation/Hospitalization: No - Comment as needed  Activities of Daily Living      Permission Sought/Granted                  Emotional Assessment         Alcohol / Substance Use: Not Applicable Psych Involvement: No (comment)  Admission diagnosis:  AAA (abdominal aortic aneurysm) [I71.40] Generalized abdominal pain [R10.84] Pain in the abdomen [R10.9] Transaminitis [R74.01] Elevated LFTs [R79.89] AAA (abdominal aortic aneurysm) without rupture [I71.40] Abdominal pain [R10.9] Chest pain [R07.9] Patient Active Problem List   Diagnosis Date Noted   Ascending cholangitis    Choledocholithiasis with obstruction 02/06/2021   Pain in the abdomen 02/05/2021   Elevated LFTs    Transaminitis    History of gallstones    AAA (abdominal aortic aneurysm) 02/04/2021   Elevated liver enzymes 02/04/2021   Nonrheumatic aortic valve insufficiency 04/24/2018   Chronic diastolic heart failure ([place]) 04/24/2018   Renovascular hypertension 04/24/2018   Ascending aortic aneurysm 04/24/2018   Dissection of descending thoracic aorta 04/24/2018   Hyperkalemia 04/24/2018   Aortic aneurysm ([place]) 10/23/2017   Nonrheumatic aortic (valve) stenosis 06/25/2017   Complete heart block ([place]) 01/23/2016   Current use of long term anticoagulation 05/04/2015   Abnormal myocardial perfusion study    Hypertensive heart  disease without heart failure    Coronary artery disease involving native coronary artery of native heart without angina pectoris    Chest pain 04/19/2015   AAA (abdominal aortic aneurysm) without rupture    Pelvic fracture (HCC) 09/16/2014   Fracture of left superior pubic ramus (HCC) 09/14/2014   Contusion of forehead 09/14/2014   Calculus of bile duct without mention of cholecystitis or obstruction 06/23/2013   Pacemaker 12/07/2012   Hypercholesterolemia 12/07/2012    Cerebral embolism with cerebral infarction ([place]) 01/22/2012   Chest pain of pericarditis 01/20/2012   Acute CHF, presumed secondary to Rt heart failure 01/20/2012   Near syncope 01/17/2012   Cardiac tamponade, recurrent episode, admitted 9/5, but now felt to be pericarditits 01/17/2012   Longstanding persistent atrial fibrillation ([place]) 01/05/2012   CAD, moderate 09/05/2011   COPD, by CXR, followed by Dr Annamaria Boots 09/05/2011   Syncope, after NTG X 1 on admission 09/05/2011   HTN (hypertension) 09/03/2011   Dyspnea on exertion, secondary to AF 09/03/2011   RBBB, intermittent 09/03/2011   Personal history of colonic polyps 06/21/2011   Right bundle branch block with left anterior fascicular block 03/19/2011   Allergic rhinitis due to pollen 07/17/2010   GERD 10/25/2009   ABDOMINAL PAIN-EPIGASTRIC 10/25/2009   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   EUSTACHIAN TUBE DYSFUNCTION 11/26/2007   DIVERTICULOSIS, COLON 07/03/2006   PCP:  Merrilee Seashore, MD Pharmacy:   [facility] [place], [address] AT [address] [place] [address] Phone: [phone] Fax: [phone]     Social Determinants of Health (SDOH) Interventions    Readmission Risk Interventions Readmission Risk Prevention Plan 02/10/2021  Transportation Screening Complete  PCP or Specialist Appt within 3-5 Days Complete  HRI or Home Care Consult Complete  Social Work Consult for [place] Planning/Counseling Complete  Palliative Care Screening Not Applicable  Medication Review Press photographer) Complete  Some recent data might be hidden

## 2021-02-10 NOTE — Discharge Summary (Signed)
Physician Discharge Summary  [name] ZOX:[id number] DOB: 27-Nov-1925 DOA: 02/04/2021  PCP: Merrilee Seashore, MD  Admit date: 02/04/2021 Discharge date: 02/10/2021  Admitted From: Home Disposition: Home with home health  Recommendations for Outpatient Follow-up:  Follow up with PCP in 1-2 weeks Please obtain CMP/CBC in 1 week.   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 fib on Pradaxa, complete heart block with permanent pacemaker in place, coronary disease, chronic diastolic heart failure, COPD, hypertension, bilateral renal artery stenosis presented to the ER with severe chest pain, abdominal pain, nausea and vomiting.  Work-up revealed elevated liver function test.  With worsening liver function test and evidence of choledocholithiasis, she underwent ERCP and sphincterotomy on 9/27 by gastroenterology and found to have purulent cholangitis.  Blood cultures negative.  Intraoperative cultures were not sent.  She received 6 days of IV Unasyn with clinical improvement. Renal functions and LFTs are gradually normalizing.  Patient is tolerating regular diet.  She does have some persistent epigastric pain otherwise mostly improved. She does have hemorrhoids and had some fresh rectal bleeding after hard stool that has improved. Patient had delusions and hallucinations with use of opiates, trying to avoid opiates and tramadol. Since her LFTs are already normalizing, she will use half dose of Tylenol as needed.  2 g total daily.  Blood pressures were persistently elevated, home medications were resumed.  Imdur dose was increased from 15 mg daily to 30 mg daily.  Mostly improved.  Does have extensive medical issues and has changes  of complications.  Today she is a stable.  Going home with her son.  She will benefit with home health therapies.  Permanent A. fib with pacemaker: She is on Pradaxa and aspirin.  Concomitant use of Pradaxa and aspirin may increase risk of bleeding, will discontinue aspirin.  She will resume Pradaxa.  Discharge Diagnoses:  Principal Problem:   Choledocholithiasis with obstruction Active Problems:   HTN (hypertension)   CAD, moderate   Chest pain   Complete heart block (HCC)   AAA (abdominal aortic aneurysm)   Elevated liver enzymes   Pain in the abdomen   Ascending cholangitis    Discharge Instructions  Discharge Instructions     Call MD for:  persistant nausea and vomiting   Complete by: As directed    Call MD for:  severe uncontrolled pain   Complete by: As directed    Diet - low sodium heart healthy   Complete by: As directed    Increase activity slowly   Complete by: As directed       Allergies as of 02/10/2021       Reactions   Tramadol Other (See Comments)   Medication made her feel "off and talk to herself"   Protonix [pantoprazole Sodium] Other (See Comments)   Abdominal pain   Pantoprazole    Other reaction(s): Other (See Comments) Abdominal pain Abdominal pain        Medication List     STOP taking these medications    aspirin 81 MG tablet   furosemide 40 MG tablet Commonly known as: LASIX   loratadine 10 MG tablet Commonly known as: CLARITIN   potassium chloride 10 MEQ tablet Commonly known as: KLOR-CON   rosuvastatin 5 MG tablet Commonly known as: CRESTOR  TAKE these medications    acetaminophen 500 MG tablet Commonly known as: TYLENOL Take 1 tablet (500 mg total) by mouth every 6 (six) hours as needed for mild pain or fever. No more than total 4 tablet a day   CALCIUM + D PO Take 1 tablet by mouth 2 (two) times daily.   dabigatran 75 MG Caps capsule Commonly known as: Pradaxa TAKE 1 CAPSULE BY MOUTH EVERY 12  HOURS What changed:  how much to take how to take this when to take this additional instructions   famotidine 20 MG tablet Commonly known as: PEPCID Take 1 tablet (20 mg total) by mouth daily. Start taking on: February 11, 2021   Fish Oil 1000 MG Caps Take 1,000 mg by mouth daily.   fluticasone 50 MCG/ACT nasal spray Commonly known as: FLONASE Place 1 spray into both nostrils daily as needed for allergies or rhinitis.   isosorbide mononitrate 30 MG 24 hr tablet Commonly known as: IMDUR Take 1 tablet (30 mg total) by mouth daily. What changed: how much to take   ketoconazole 2 % cream Commonly known as: NIZORAL SMARTSIG:1 Sparingly Topical Daily   magnesium oxide 400 (241.3 Mg) MG tablet Commonly known as: MAG-OX Take 1 tablet (400 mg total) by mouth daily.   metoprolol succinate 50 MG 24 hr tablet Commonly known as: TOPROL-XL TAKE 1 TABLET BY MOUTH TWICE DAILY. TAKE WITH OR IMMEDIATELY FOLLOWING A MEAL What changed:  how much to take how to take this when to take this additional instructions   multivitamin with minerals Tabs tablet Take 1 tablet by mouth daily.   nitroGLYCERIN 0.4 MG SL tablet Commonly known as: NITROSTAT Place 1 tablet (0.4 mg total) under the tongue every 5 (five) minutes as needed for chest pain. DO NOT TAKE MORE THAN 3 DOSES WITHIN 15 MINS   potassium chloride 10 MEQ tablet Commonly known as: KLOR-CON TAKE 1 TABLET(10 MEQ) BY MOUTH DAILY What changed: See the new instructions.   PreserVision AREDS 2 Caps Take 1 capsule by mouth 2 (two) times daily.   SYSTANE OP Place 2 drops into both eyes 2 (two) times daily as needed (for dry eyes).        Follow-up Information     Merrilee Seashore, MD Follow up in 2 week(s).   Specialty: Internal Medicine Why: please repeat CBC and CMP in one week. Contact information: [address] [place] [place] [address] [phone]         [facility] Follow up.    Specialty: Home Health Services Why: HHPT, HHOT, HHAIDE they will call to schedule apt times Contact information: [address] [place] [address] [phone]                Allergies  Allergen Reactions   Tramadol Other (See Comments)    Medication made her feel "off and talk to herself"   Protonix [Pantoprazole Sodium] Other (See Comments)    Abdominal pain   Pantoprazole     Other reaction(s): Other (See Comments) Abdominal pain Abdominal pain    Consultations: Vascular surgery Gastroenterology   Procedures/Studies: DG Chest 2 View  Result Date: 02/04/2021 CLINICAL DATA:  Chest pain, nausea, vomiting EXAM: CHEST - 2 VIEW COMPARISON:  12/05/2017, 08/10/2020 FINDINGS: Left-sided implanted cardiac device remains stable in positioning. Mild cardiomegaly. Aortic atherosclerosis. Mild bibasilar atelectasis, slightly more prominent on the left. No pleural effusion. No pneumothorax. Advanced degenerative changes of the right shoulder. IMPRESSION: Mild bibasilar atelectasis,  slightly more prominent on the left. Electronically Signed   By: Davina Poke D.O.   On: 02/04/2021 17:57   DG ERCP  Result Date: 02/06/2021 CLINICAL DATA:  Choledocholithiasis EXAM: ERCP TECHNIQUE: Multiple spot images obtained with the fluoroscopic device and submitted for interpretation post-procedure. COMPARISON:  Right upper quadrant ultrasound on 02/04/2021 FINDINGS: Imaging with a C-arm during the endoscopic procedure shows cannulation of the common bile duct with cholangiogram demonstrating dilatation of the common duct common visualized intrahepatic ducts and suggestion of multiple filling defects consistent with choledocholithiasis. Presumed removal common duct stones with a final cholangiogram showing no significant residual filling defects in the common bile duct. IMPRESSION: Choledocholithiasis with removal of common bile duct calculi. These images were submitted for radiologic  interpretation only. Please see the procedural report for the amount of contrast and the fluoroscopy time utilized. Electronically Signed   By: Aletta Edouard M.D.   On: 02/06/2021 10:12   ECHOCARDIOGRAM COMPLETE  Result Date: 01/29/2021    ECHOCARDIOGRAM REPORT   Patient Name:   ZAKYRA KUKUK Date of Exam: 01/29/2021 Medical Rec #:  [id number]     Height:       62.0 in Accession #:    [id number]    Weight:       125.6 lb Date of Birth:  Jan 27, 1926      BSA:          1.569 m Patient Age:    19 years      BP:           144/79 mmHg Patient Gender: F             HR:           70 bpm. Exam Location:  [address] Procedure: 2D Echo, Cardiac Doppler and Color Doppler Indications:    I35.2 Aortic insufficiency with aortic stenosis.  History:        Patient has prior history of Echocardiogram examinations, most                 recent 05/19/2018. CHF, CAD, Pacemaker, Stroke and COPD, Aortic                 Valve Disease, Arrythmias:RBBB, Atrial Fibrillation and Complete                 heart block., Signs/Symptoms:Syncope and Shortness of Breath;                 Risk Factors:Hypertension, Dyslipidemia and Former Smoker.                 Tamponade. Ascending aortic aneruysm. Stanford Type B                 dissection.  Sonographer:    Basilia Jumbo BS, RDCS Referring Phys: Dale  1. Left ventricular ejection fraction, by estimation, is 60 to 65%. The left ventricle has normal function. The left ventricle has no regional wall motion abnormalities. There is moderate left ventricular hypertrophy. Left ventricular diastolic parameters are indeterminate.  2. Right ventricular systolic function is mildly reduced. The right ventricular size is moderately enlarged. There is mildly elevated pulmonary artery systolic pressure. The estimated right ventricular systolic pressure is 23.7 mmHg.  3. Left atrial size was severely dilated.  4. Right atrial size was moderately dilated.  5. The mitral valve is  degenerative. Mild to moderate mitral valve regurgitation. Mild mitral stenosis. The mean mitral valve gradient is 4.0 mmHg. Moderate to severe  mitral annular calcification.  6. The tricuspid valve is abnormal. Tricuspid valve regurgitation is severe.  7. The aortic valve is tricuspid. Aortic valve regurgitation is moderate. Mild aortic valve stenosis. Aortic valve area, by VTI measures 2.04 cm. Aortic valve mean gradient measures 12.0 mmHg.  8. Aortic dilatation noted. There is mild dilatation of the ascending aorta, measuring 41 mm.  9. The inferior vena cava is dilated in size with >50% respiratory variability, suggesting right atrial pressure of 8 mmHg. 10. The patient was in atrial fibrillation. Comparison(s): 1/720 EF 65-70%. Mild AI. FINDINGS  Left Ventricle: Left ventricular ejection fraction, by estimation, is 60 to 65%. The left ventricle has normal function. The left ventricle has no regional wall motion abnormalities. The left ventricular internal cavity size was normal in size. There is  moderate left ventricular hypertrophy. Left ventricular diastolic parameters are indeterminate. Right Ventricle: The right ventricular size is moderately enlarged. No increase in right ventricular wall thickness. Right ventricular systolic function is mildly reduced. There is mildly elevated pulmonary artery systolic pressure. The tricuspid regurgitant velocity is 2.95 m/s, and with an assumed right atrial pressure of 8 mmHg, the estimated right ventricular systolic pressure is 23.7 mmHg. Left Atrium: Left atrial size was severely dilated. Right Atrium: Right atrial size was moderately dilated. Pericardium: There is no evidence of pericardial effusion. Mitral Valve: The mitral valve is degenerative in appearance. There is moderate calcification of the mitral valve leaflet(s). Moderate to severe mitral annular calcification. Mild to moderate mitral valve regurgitation. Mild mitral valve stenosis. The mean mitral valve  gradient is 4.0 mmHg. Tricuspid Valve: The tricuspid valve is abnormal. Tricuspid valve regurgitation is severe. Aortic Valve: The aortic valve is tricuspid. Aortic valve regurgitation is moderate. Aortic regurgitation PHT measures 440 msec. Mild aortic stenosis is present. Aortic valve mean gradient measures 12.0 mmHg. Aortic valve peak gradient measures 22.8 mmHg. Aortic valve area, by VTI measures 2.04 cm. Pulmonic Valve: The pulmonic valve was normal in structure. Pulmonic valve regurgitation is not visualized. Aorta: Aortic dilatation noted. There is mild dilatation of the ascending aorta, measuring 41 mm. Venous: The inferior vena cava is dilated in size with greater than 50% respiratory variability, suggesting right atrial pressure of 8 mmHg. IAS/Shunts: No atrial level shunt detected by color flow Doppler. Additional Comments: A device lead is visualized in the right ventricle.  LEFT VENTRICLE PLAX 2D LVIDd:         4.60 cm LVIDs:         2.90 cm LV PW:         1.10 cm LV IVS:        1.20 cm LVOT diam:     2.10 cm LV SV:         87 LV SV Index:   56 LVOT Area:     3.46 cm  RIGHT VENTRICLE RV Basal diam:  3.60 cm RV S prime:     9.93 cm/s TAPSE (M-mode): 1.2 cm RVSP:           37.8 mmHg LEFT ATRIUM             Index       RIGHT ATRIUM           Index LA diam:        3.50 cm 2.23 cm/m  RA Pressure: 3.00 mmHg LA Vol (A2C):   76.6 ml 48.83 ml/m RA Area:     21.40 cm LA Vol (A4C):   84.7 ml 53.99 ml/m RA Volume:  71.70 ml  45.71 ml/m LA Biplane Vol: 80.2 ml 51.12 ml/m  AORTIC VALVE AV Area (Vmax):    1.84 cm AV Area (Vmean):   1.86 cm AV Area (VTI):     2.04 cm AV Vmax:           238.67 cm/s AV Vmean:          157.750 cm/s AV VTI:            0.429 m AV Peak Grad:      22.8 mmHg AV Mean Grad:      12.0 mmHg LVOT Vmax:         127.00 cm/s LVOT Vmean:        84.900 cm/s LVOT VTI:          0.252 m LVOT/AV VTI ratio: 0.59 AI PHT:            440 msec  AORTA Ao Root diam: 3.30 cm Ao Asc diam:  4.10 cm MITRAL  VALVE                 TRICUSPID VALVE MV Mean grad: 4.0 mmHg       TR Peak grad:   34.8 mmHg MR Peak grad:    132.7 mmHg  TR Vmax:        295.00 cm/s MR Mean grad:    85.0 mmHg   Estimated RAP:  3.00 mmHg MR Vmax:         576.00 cm/s RVSP:           37.8 mmHg MR Vmean:        430.0 cm/s MR PISA:         1.57 cm    SHUNTS MR PISA Eff ROA: 11 mm      Systemic VTI:  0.25 m MR PISA Radius:  0.50 cm     Systemic Diam: 2.10 cm Dalton McleanMD Electronically signed by Franki Monte Signature Date/Time: 01/29/2021/4:42:22 PM    Final    CT Angio Chest/Abd/Pel for Dissection W and/or Wo Contrast  Result Date: 02/04/2021 CLINICAL DATA:  Abdominal pain, aortic dissection suspected. concerned for dissection. EXAM: CT ANGIOGRAPHY CHEST, ABDOMEN AND PELVIS TECHNIQUE: Non-contrast CT of the chest was initially obtained. Multidetector CT imaging through the chest, abdomen and pelvis was performed using the standard protocol during bolus administration of intravenous contrast. Multiplanar reconstructed images and MIPs were obtained and reviewed to evaluate the vascular anatomy. CONTRAST:  61mL OMNIPAQUE IOHEXOL 350 MG/ML SOLN COMPARISON:  CT angiography chest, abdomen, pelvis 08/10/2020 FINDINGS: CTA CHEST FINDINGS Lines and tubes: Cardiovascular: Preferential opacification of the thoracic aorta. Stable aneurysmal ascending thoracic aorta measuring up to 4.2 cm. The descending thoracic aorta is normal in caliber. No dissection identified. Severe atherosclerotic plaque. Four-vessel coronary artery calcifications. Enlarged heart. No pericardial effusion. The bilateral main pulmonary arteries is enlarged in caliber. No central or segmental pulmonary embolus. Mediastinum/Nodes: No enlarged mediastinal, hilar, or axillary lymph nodes. Thyroid gland, trachea, and esophagus demonstrate no significant findings. Lungs/Pleura: No focal consolidation. No pulmonary nodule. No pulmonary mass. No pleural effusion. No pneumothorax.  Musculoskeletal: Review of the MIP images confirms the above findings. CTA ABDOMEN AND PELVIS FINDINGS VASCULAR Aorta: Severe atherosclerotic plaque. No suprarenal abdominal aorta aneurysm. Interval increase in size of an infrarenal abdominal aorta measuring up to 6.2 cm (from 5.8 cm). The aneurysm is again noted to extend approximately 7 cm in the craniocaudal dimension to the aortic bifurcation. Large thrombosed portion is again noted. No surrounding fat stranding  or draping over the lumbar thoracic aorta. No dissection identified. Celiac: Atherosclerotic plaque. Patent without evidence of aneurysm, dissection, vasculitis or significant stenosis. SMA: Atherosclerotic plaque. Patent without evidence of aneurysm, dissection, vasculitis or significant stenosis. Renals: Atherosclerotic plaque. Both renal arteries are patent without evidence of aneurysm, dissection, vasculitis, fibromuscular dysplasia or significant stenosis. MCN:[id number] plaque. Patent without evidence of aneurysm, dissection, vasculitis or significant stenosis. Inflow: Patent without evidence of aneurysm, dissection, vasculitis or significant stenosis. Veins: No obvious venous abnormality within the limitations of this arterial phase study. Review of the MIP images confirms the above findings. NON-VASCULAR Hepatobiliary: No focal liver abnormality. No gallstones, gallbladder wall thickening, or pericholecystic fluid. No biliary dilatation. Pancreas: No focal lesion. Normal pancreatic contour. No surrounding inflammatory changes. No main pancreatic ductal dilatation. Spleen: Normal in size without focal abnormality. Adrenals/Urinary Tract: No adrenal nodule bilaterally. Bilateral kidneys enhance symmetrically. Similar-appearing 2.8 cm heterogeneous fat containing left renal lesion likely representing an angiomyolipoma. Stable 1.1 cm left hypodense lesion with a density of 83 Hounsfield units. Subcentimeter hypodensities are too small to  characterize. Stable 1.1 cm right inferior renal pole lesion with a density of 46 Hounsfield units. No hydronephrosis. No hydroureter. The urinary bladder is unremarkable. Stomach/Bowel: Stomach is within normal limits. No evidence of bowel wall thickening or dilatation. Diffuse colonic diverticulosis. Appendix appears normal. Lymphatic: No lymphadenopathy. Reproductive: The uterus and bilateral adnexal regions are unremarkable. Other: No intraperitoneal free fluid. No intraperitoneal free gas. No organized fluid collection. Musculoskeletal: No abdominal wall hernia or abnormality. No suspicious lytic or blastic osseous lesions. No acute displaced fracture. Multilevel degenerative changes of the spine with a compression fracture status post kyphoplasty at the L3 level. Old healed pelvic fractures. Review of the MIP images confirms the above findings. IMPRESSION: 1. Interval increase in size of a known infrarenal abdominal aorta aneurysm (6.2 cm from 5.8 cm). No findings to suggest imminent rupture. Recommend referral to a vascular specialist. This recommendation follows ACR consensus guidelines: White Paper of the ACR Incidental Findings Committee II on Vascular Findings. J Am Coll Radiol 2013; 10:789-794. 2. Stable aneurysmal thoracic ascending aorta (4.2 cm). Recommend annual imaging followup by CTA or MRA. This recommendation follows 2010 ACCF/AHA/AATS/ACR/ASA/SCA/SCAI/SIR/STS/SVM Guidelines for the Diagnosis and Management of Patients with Thoracic Aortic Disease. Circulation. 2010; 121: L465-K354. Aortic aneurysm NOS (ICD10-I71.9) 3. Cardiomegaly. 4. Enlarged bilateral main pulmonary arteries suggestive of pulmonary hypertension. No central or segmental pulmonary embolus. 5. Aortic Atherosclerosis (ICD10-I70.0) including four-vessel coronary calcifications. 6. Other imaging findings of potential clinical significance: Stable indeterminate bilateral 1.1 cm renal lesions. 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  Diffuse colonic diverticulosis with no acute diverticulitis. These results were called by telephone at the time of interpretation on 02/04/2021 at 7:07 pm to provider Dr. Sabra Heck, who verbally acknowledged these results. Electronically Signed   By: Iven Finn M.D.   On: 02/04/2021 19:27   US Abdomen Limited RUQ (LIVER/GB)  Result Date: 02/04/2021 CLINICAL DATA:  Elevated liver enzymes. EXAM: ULTRASOUND ABDOMEN LIMITED RIGHT UPPER QUADRANT COMPARISON:  CT angiography abdomen pelvis 12/22/2019, right upper quadrant ultrasound 12/28/2016 FINDINGS: Gallbladder: Status post cholecystectomy. Biliary ducts: Intra and extrahepatic biliary ductal dilatation. Common bile duct diameter: 13 mm (stable compared to prior). Liver: No focal lesion identified. Within normal limits in parenchymal echogenicity. Portal vein is patent on color Doppler imaging with normal direction of blood flow towards the liver. Other: None. IMPRESSION: Persistent intra and extrahepatic biliary ductal dilatation which can be seen in the post cholecystectomy setting. Electronically Signed   By:  Iven Finn M.D.   On: 02/04/2021 23:00   (Echo, Carotid, EGD, Colonoscopy, ERCP)    Subjective: Patient seen and examined.  She had some discomfort in her epigastrium but denies any other complaints.  Eating regular meals.  Blood pressure is mildly elevated but asymptomatic.  She feels comfortable to go home. Called and discussed with her son about discharge planning and he is ready for her to go home.   Discharge Exam: Vitals:   02/10/21 1000 02/10/21 1021  BP: (!) 141/89   Pulse: 70   Resp: 16   Temp:    SpO2: 95% 95%   Vitals:   02/10/21 0900 02/10/21 0907 02/10/21 1000 02/10/21 1021  BP: 130/78 130/78 (!) 141/89   Pulse: 70 72 70   Resp: 16  16   Temp:      TempSrc:      SpO2: 95%  95% 95%  Weight:      Height:        General: Pt is alert, awake, not in acute distress Frail and debilitated.  She is on room air.  Looks  age-appropriate. Cardiovascular: RRR, S1/S2 +, no rubs, no gallops, pacemaker in place. Respiratory: CTA bilaterally, no wheezing, no rhonchi Abdominal: Soft, NT, ND, bowel sounds + Extremities: no edema, no cyanosis    The results of significant diagnostics from this hospitalization (including imaging, microbiology, ancillary and laboratory) are listed below for reference.     Microbiology: Recent Results (from the past 240 hour(s))  Resp Panel by RT-PCR (Flu A&B, Covid) Nasopharyngeal Swab     Status: None   Collection Time: 02/04/21  9:40 PM   Specimen: Nasopharyngeal Swab; Nasopharyngeal(NP) swabs in vial transport medium  Result Value Ref Range Status   SARS Coronavirus 2 by RT PCR NEGATIVE NEGATIVE Final    Comment: (NOTE) SARS-CoV-2 target nucleic acids are NOT DETECTED.  The SARS-CoV-2 RNA is generally detectable in upper respiratory specimens during the acute phase of infection. The lowest concentration of SARS-CoV-2 viral copies this assay can detect is 138 copies/mL. A negative result does not preclude SARS-Cov-2 infection and should not be used as the sole basis for treatment or other patient management decisions. A negative result may occur with  improper specimen collection/handling, submission of specimen other than nasopharyngeal swab, presence of viral mutation(s) within the areas targeted by this assay, and inadequate number of viral copies(<138 copies/mL). A negative result must be combined with clinical observations, patient history, and epidemiological information. The expected result is Negative.  Fact Sheet for Patients:  EntrepreneurPulse.com.au  Fact Sheet for Healthcare Providers:  IncredibleEmployment.be  This test is no t yet approved or cleared by the [place] FDA and  has been authorized for detection and/or diagnosis of SARS-CoV-2 by FDA under an Emergency Use Authorization (EUA). This EUA will remain   in effect (meaning this test can be used) for the duration of the COVID-19 declaration under Section 564(b)(1) of the Act, 21 U.S.C.section 360bbb-3(b)(1), unless the authorization is terminated  or revoked sooner.       Influenza A by PCR NEGATIVE NEGATIVE Final   Influenza B by PCR NEGATIVE NEGATIVE Final    Comment: (NOTE) The Xpert Xpress SARS-CoV-2/FLU/RSV plus assay is intended as an aid in the diagnosis of influenza from Nasopharyngeal swab specimens and should not be used as a sole basis for treatment. Nasal washings and aspirates are unacceptable for Xpert Xpress SARS-CoV-2/FLU/RSV testing.  Fact Sheet for Patients: EntrepreneurPulse.com.au  Fact Sheet for Healthcare Providers: IncredibleEmployment.be  This test is not yet approved or cleared by the [place] and has been authorized for detection and/or diagnosis of SARS-CoV-2 by FDA under an Emergency Use Authorization (EUA). This EUA will remain in effect (meaning this test can be used) for the duration of the COVID-19 declaration under Section 564(b)(1) of the Act, 21 U.S.C. section 360bbb-3(b)(1), unless the authorization is terminated or revoked.  Performed at [facility], [place] [address] [place], [place] [address]   MRSA Next Gen by PCR, Nasal     Status: None   Collection Time: 02/05/21  5:08 PM   Specimen: Nasal Mucosa; Nasal Swab  Result Value Ref Range Status   MRSA by PCR Next Gen NOT DETECTED NOT DETECTED Final    Comment: (NOTE) The GeneXpert MRSA Assay (FDA approved for NASAL specimens only), is one component of a comprehensive MRSA colonization surveillance program. It is not intended to diagnose MRSA infection nor to guide or monitor treatment for MRSA infections. Test performance is not FDA approved in patients less than 9 years old. Performed at [facility], [place] [address], [place], [place] [address]      Labs: BNP (last 3  results) No results for input(s): BNP in the last 8760 hours. Basic Metabolic Panel: Recent Labs  Lab 02/06/21 0410 02/07/21 0033 02/08/21 0030 02/09/21 0030 02/10/21 0018  NA 138 136 136 139 141  K 4.6 4.1 4.9 3.9 4.6  CL 103 106 106 108 108  CO2 23 19* 19* 20* 25  GLUCOSE 110* 93 119* 110* 110*  BUN 43* 52* 58* 45* 31*  CREATININE 2.04* 2.45* 2.33* 1.75* 1.38*  CALCIUM 8.6* 7.9* 7.9* 8.0* 8.0*  MG  --   --  2.4  --   --   PHOS  --   --  3.5  --   --    Liver Function Tests: Recent Labs  Lab 02/06/21 0410 02/07/21 0033 02/08/21 0030 02/09/21 0030 02/10/21 0018  AST 264* 110* 85* 47* 38  ALT 254* 145* 106* 86* 67*  ALKPHOS 286* 257* 314* 322* 336*  BILITOT 7.6* 6.4* 4.8* 4.3* 3.6*  PROT 6.0* 5.4* 5.3* 5.3* 5.4*  ALBUMIN 3.1* 2.9* 2.6* 2.6* 2.7*   Recent Labs  Lab 02/04/21 1714  LIPASE 42   No results for input(s): AMMONIA in the last 168 hours. CBC: Recent Labs  Lab 02/04/21 1714 02/06/21 0410 02/07/21 0303 02/08/21 0030 02/09/21 0030 02/10/21 0018  WBC 7.9 17.0* 9.7 9.6 7.3 9.8  NEUTROABS 7.1  --   --  8.0* 5.6 7.5  HGB 15.0 14.8 14.1 13.2 14.0 13.9  HCT 45.8 44.5 41.5 38.9 41.0 42.1  MCV 99.8 98.5 95.8 95.6 94.5 96.8  PLT 146* PLATELET CLUMPS NOTED ON SMEAR, UNABLE TO ESTIMATE 90* 97* 91* 106*   Cardiac Enzymes: No results for input(s): CKTOTAL, CKMB, CKMBINDEX, TROPONINI in the last 168 hours. BNP: Invalid input(s): POCBNP CBG: No results for input(s): GLUCAP in the last 168 hours. D-Dimer No results for input(s): DDIMER in the last 72 hours. Hgb A1c No results for input(s): HGBA1C in the last 72 hours. Lipid Profile No results for input(s): CHOL, HDL, LDLCALC, TRIG, CHOLHDL, LDLDIRECT in the last 72 hours. Thyroid function studies No results for input(s): TSH, T4TOTAL, T3FREE, THYROIDAB in the last 72 hours.  Invalid input(s): FREET3 Anemia work up No results for input(s): VITAMINB12, FOLATE, FERRITIN, TIBC, IRON, RETICCTPCT in the last  72 hours. Urinalysis    Component Value Date/Time  COLORURINE AMBER (A) 02/04/2021 0037   APPEARANCEUR CLEAR 02/04/2021 0037   LABSPEC >1.046 (H) 02/04/2021 0037   PHURINE 5.0 02/04/2021 0037   GLUCOSEU NEGATIVE 02/04/2021 0037   HGBUR NEGATIVE 02/04/2021 0037   BILIRUBINUR NEGATIVE 02/04/2021 0037   KETONESUR NEGATIVE 02/04/2021 0037   PROTEINUR NEGATIVE 02/04/2021 0037   UROBILINOGEN 0.2 09/16/2014 1513   NITRITE NEGATIVE 02/04/2021 0037   LEUKOCYTESUR NEGATIVE 02/04/2021 0037   Sepsis Labs Invalid input(s): PROCALCITONIN,  WBC,  LACTICIDVEN Microbiology Recent Results (from the past 240 hour(s))  Resp Panel by RT-PCR (Flu A&B, Covid) Nasopharyngeal Swab     Status: None   Collection Time: 02/04/21  9:40 PM   Specimen: Nasopharyngeal Swab; Nasopharyngeal(NP) swabs in vial transport medium  Result Value Ref Range Status   SARS Coronavirus 2 by RT PCR NEGATIVE NEGATIVE Final    Comment: (NOTE) SARS-CoV-2 target nucleic acids are NOT DETECTED.  The SARS-CoV-2 RNA is generally detectable in upper respiratory specimens during the acute phase of infection. The lowest concentration of SARS-CoV-2 viral copies this assay can detect is 138 copies/mL. A negative result does not preclude SARS-Cov-2 infection and should not be used as the sole basis for treatment or other patient management decisions. A negative result may occur with  improper specimen collection/handling, submission of specimen other than nasopharyngeal swab, presence of viral mutation(s) within the areas targeted by this assay, and inadequate number of viral copies(<138 copies/mL). A negative result must be combined with clinical observations, patient history, and epidemiological information. The expected result is Negative.  Fact Sheet for Patients:  EntrepreneurPulse.com.au  Fact Sheet for Healthcare Providers:  IncredibleEmployment.be  This test is no t yet approved or  cleared by the [place] FDA and  has been authorized for detection and/or diagnosis of SARS-CoV-2 by FDA under an Emergency Use Authorization (EUA). This EUA will remain  in effect (meaning this test can be used) for the duration of the COVID-19 declaration under Section 564(b)(1) of the Act, 21 U.S.C.section 360bbb-3(b)(1), unless the authorization is terminated  or revoked sooner.       Influenza A by PCR NEGATIVE NEGATIVE Final   Influenza B by PCR NEGATIVE NEGATIVE Final    Comment: (NOTE) The Xpert Xpress SARS-CoV-2/FLU/RSV plus assay is intended as an aid in the diagnosis of influenza from Nasopharyngeal swab specimens and should not be used as a sole basis for treatment. Nasal washings and aspirates are unacceptable for Xpert Xpress SARS-CoV-2/FLU/RSV testing.  Fact Sheet for Patients: EntrepreneurPulse.com.au  Fact Sheet for Healthcare Providers: IncredibleEmployment.be  This test is not yet approved or cleared by the [place] FDA and has been authorized for detection and/or diagnosis of SARS-CoV-2 by FDA under an Emergency Use Authorization (EUA). This EUA will remain in effect (meaning this test can be used) for the duration of the COVID-19 declaration under Section 564(b)(1) of the Act, 21 U.S.C. section 360bbb-3(b)(1), unless the authorization is terminated or revoked.  Performed at [facility], [place] [address], [place],  [address]   MRSA Next Gen by PCR, Nasal     Status: None   Collection Time: 02/05/21  5:08 PM   Specimen: Nasal Mucosa; Nasal Swab  Result Value Ref Range Status   MRSA by PCR Next Gen NOT DETECTED NOT DETECTED Final    Comment: (NOTE) The GeneXpert MRSA Assay (FDA approved for NASAL specimens only), is one component of a comprehensive MRSA colonization surveillance program. It is not intended to diagnose MRSA infection nor to guide or  monitor treatment for MRSA infections. Test  performance is not FDA approved in patients less than 71 years old. Performed at [facility], [place] [address], [place], [place] [address]      Time coordinating discharge:  35 minutes  SIGNED:   Barb Merino, MD  [facility] 02/10/2021, 11:14 AM

## 2021-02-12 LAB — CUP PACEART REMOTE DEVICE CHECK
Date Time Interrogation Session: 20221003113516
Implantable Lead Implant Date: 20131211
Implantable Lead Implant Date: 20131211
Implantable Lead Location: 753859
Implantable Lead Location: 753860
Implantable Lead Model: 4135
Implantable Lead Model: 4136
Implantable Lead Serial Number: 29296655
Implantable Lead Serial Number: 29317536
Implantable Pulse Generator Implant Date: 20131211
Pulse Gen Serial Number: 144309

## 2021-02-12 NOTE — Telephone Encounter (Signed)
Would advise that she follow the discharge instructions - can consider resuming meds if needed when she follows-up with Dr. Loletha Grayer  Dr Lemmie Evens

## 2021-02-13 DIAGNOSIS — N289 Disorder of kidney and ureter, unspecified: Secondary | ICD-10-CM | POA: Diagnosis not present

## 2021-02-13 DIAGNOSIS — K8031 Calculus of bile duct with cholangitis, unspecified, with obstruction: Secondary | ICD-10-CM | POA: Diagnosis not present

## 2021-02-13 DIAGNOSIS — I701 Atherosclerosis of renal artery: Secondary | ICD-10-CM | POA: Diagnosis not present

## 2021-02-13 DIAGNOSIS — I442 Atrioventricular block, complete: Secondary | ICD-10-CM | POA: Diagnosis not present

## 2021-02-13 DIAGNOSIS — I5032 Chronic diastolic (congestive) heart failure: Secondary | ICD-10-CM | POA: Diagnosis not present

## 2021-02-13 DIAGNOSIS — N189 Chronic kidney disease, unspecified: Secondary | ICD-10-CM | POA: Diagnosis not present

## 2021-02-13 DIAGNOSIS — I7143 Infrarenal abdominal aortic aneurysm, without rupture: Secondary | ICD-10-CM | POA: Diagnosis not present

## 2021-02-13 DIAGNOSIS — I2510 Atherosclerotic heart disease of native coronary artery without angina pectoris: Secondary | ICD-10-CM | POA: Diagnosis not present

## 2021-02-13 DIAGNOSIS — J449 Chronic obstructive pulmonary disease, unspecified: Secondary | ICD-10-CM | POA: Diagnosis not present

## 2021-02-13 DIAGNOSIS — I082 Rheumatic disorders of both aortic and tricuspid valves: Secondary | ICD-10-CM | POA: Diagnosis not present

## 2021-02-13 DIAGNOSIS — R443 Hallucinations, unspecified: Secondary | ICD-10-CM | POA: Diagnosis not present

## 2021-02-13 DIAGNOSIS — I71012 Dissection of descending thoracic aorta: Secondary | ICD-10-CM | POA: Diagnosis not present

## 2021-02-13 DIAGNOSIS — J309 Allergic rhinitis, unspecified: Secondary | ICD-10-CM | POA: Diagnosis not present

## 2021-02-13 DIAGNOSIS — K219 Gastro-esophageal reflux disease without esophagitis: Secondary | ICD-10-CM | POA: Diagnosis not present

## 2021-02-13 DIAGNOSIS — K649 Unspecified hemorrhoids: Secondary | ICD-10-CM | POA: Diagnosis not present

## 2021-02-13 DIAGNOSIS — L89152 Pressure ulcer of sacral region, stage 2: Secondary | ICD-10-CM | POA: Diagnosis not present

## 2021-02-13 DIAGNOSIS — E785 Hyperlipidemia, unspecified: Secondary | ICD-10-CM | POA: Diagnosis not present

## 2021-02-13 DIAGNOSIS — R011 Cardiac murmur, unspecified: Secondary | ICD-10-CM | POA: Diagnosis not present

## 2021-02-13 DIAGNOSIS — I130 Hypertensive heart and chronic kidney disease with heart failure and stage 1 through stage 4 chronic kidney disease, or unspecified chronic kidney disease: Secondary | ICD-10-CM | POA: Diagnosis not present

## 2021-02-13 DIAGNOSIS — T40605D Adverse effect of unspecified narcotics, subsequent encounter: Secondary | ICD-10-CM | POA: Diagnosis not present

## 2021-02-13 DIAGNOSIS — I495 Sick sinus syndrome: Secondary | ICD-10-CM | POA: Diagnosis not present

## 2021-02-13 DIAGNOSIS — I773 Arterial fibromuscular dysplasia: Secondary | ICD-10-CM | POA: Diagnosis not present

## 2021-02-13 DIAGNOSIS — I4819 Other persistent atrial fibrillation: Secondary | ICD-10-CM | POA: Diagnosis not present

## 2021-02-13 DIAGNOSIS — M1990 Unspecified osteoarthritis, unspecified site: Secondary | ICD-10-CM | POA: Diagnosis not present

## 2021-02-13 DIAGNOSIS — K5790 Diverticulosis of intestine, part unspecified, without perforation or abscess without bleeding: Secondary | ICD-10-CM | POA: Diagnosis not present

## 2021-02-15 DIAGNOSIS — I5032 Chronic diastolic (congestive) heart failure: Secondary | ICD-10-CM | POA: Diagnosis not present

## 2021-02-15 DIAGNOSIS — I130 Hypertensive heart and chronic kidney disease with heart failure and stage 1 through stage 4 chronic kidney disease, or unspecified chronic kidney disease: Secondary | ICD-10-CM | POA: Diagnosis not present

## 2021-02-15 DIAGNOSIS — K8031 Calculus of bile duct with cholangitis, unspecified, with obstruction: Secondary | ICD-10-CM | POA: Diagnosis not present

## 2021-02-15 DIAGNOSIS — I2510 Atherosclerotic heart disease of native coronary artery without angina pectoris: Secondary | ICD-10-CM | POA: Diagnosis not present

## 2021-02-15 DIAGNOSIS — I7143 Infrarenal abdominal aortic aneurysm, without rupture: Secondary | ICD-10-CM | POA: Diagnosis not present

## 2021-02-15 DIAGNOSIS — N189 Chronic kidney disease, unspecified: Secondary | ICD-10-CM | POA: Diagnosis not present

## 2021-02-16 DIAGNOSIS — I7143 Infrarenal abdominal aortic aneurysm, without rupture: Secondary | ICD-10-CM | POA: Diagnosis not present

## 2021-02-16 DIAGNOSIS — K8031 Calculus of bile duct with cholangitis, unspecified, with obstruction: Secondary | ICD-10-CM | POA: Diagnosis not present

## 2021-02-16 DIAGNOSIS — I2510 Atherosclerotic heart disease of native coronary artery without angina pectoris: Secondary | ICD-10-CM | POA: Diagnosis not present

## 2021-02-16 DIAGNOSIS — N189 Chronic kidney disease, unspecified: Secondary | ICD-10-CM | POA: Diagnosis not present

## 2021-02-16 DIAGNOSIS — I5032 Chronic diastolic (congestive) heart failure: Secondary | ICD-10-CM | POA: Diagnosis not present

## 2021-02-16 DIAGNOSIS — I130 Hypertensive heart and chronic kidney disease with heart failure and stage 1 through stage 4 chronic kidney disease, or unspecified chronic kidney disease: Secondary | ICD-10-CM | POA: Diagnosis not present

## 2021-02-16 NOTE — Progress Notes (Signed)
Remote pacemaker transmission.   

## 2021-02-16 NOTE — Addendum Note (Signed)
Addended by: Douglass Rivers D on: 02/16/2021 05:11 PM   Modules accepted: Level of Service

## 2021-02-19 DIAGNOSIS — I5032 Chronic diastolic (congestive) heart failure: Secondary | ICD-10-CM | POA: Diagnosis not present

## 2021-02-19 DIAGNOSIS — I130 Hypertensive heart and chronic kidney disease with heart failure and stage 1 through stage 4 chronic kidney disease, or unspecified chronic kidney disease: Secondary | ICD-10-CM | POA: Diagnosis not present

## 2021-02-19 DIAGNOSIS — I2510 Atherosclerotic heart disease of native coronary artery without angina pectoris: Secondary | ICD-10-CM | POA: Diagnosis not present

## 2021-02-19 DIAGNOSIS — I7143 Infrarenal abdominal aortic aneurysm, without rupture: Secondary | ICD-10-CM | POA: Diagnosis not present

## 2021-02-19 DIAGNOSIS — I10 Essential (primary) hypertension: Secondary | ICD-10-CM | POA: Diagnosis not present

## 2021-02-19 DIAGNOSIS — N189 Chronic kidney disease, unspecified: Secondary | ICD-10-CM | POA: Diagnosis not present

## 2021-02-19 DIAGNOSIS — K8031 Calculus of bile duct with cholangitis, unspecified, with obstruction: Secondary | ICD-10-CM | POA: Diagnosis not present

## 2021-02-20 DIAGNOSIS — I5032 Chronic diastolic (congestive) heart failure: Secondary | ICD-10-CM | POA: Diagnosis not present

## 2021-02-20 DIAGNOSIS — I7143 Infrarenal abdominal aortic aneurysm, without rupture: Secondary | ICD-10-CM | POA: Diagnosis not present

## 2021-02-20 DIAGNOSIS — N189 Chronic kidney disease, unspecified: Secondary | ICD-10-CM | POA: Diagnosis not present

## 2021-02-20 DIAGNOSIS — I2510 Atherosclerotic heart disease of native coronary artery without angina pectoris: Secondary | ICD-10-CM | POA: Diagnosis not present

## 2021-02-20 DIAGNOSIS — I130 Hypertensive heart and chronic kidney disease with heart failure and stage 1 through stage 4 chronic kidney disease, or unspecified chronic kidney disease: Secondary | ICD-10-CM | POA: Diagnosis not present

## 2021-02-20 DIAGNOSIS — K8031 Calculus of bile duct with cholangitis, unspecified, with obstruction: Secondary | ICD-10-CM | POA: Diagnosis not present

## 2021-02-21 DIAGNOSIS — I130 Hypertensive heart and chronic kidney disease with heart failure and stage 1 through stage 4 chronic kidney disease, or unspecified chronic kidney disease: Secondary | ICD-10-CM | POA: Diagnosis not present

## 2021-02-21 DIAGNOSIS — I2510 Atherosclerotic heart disease of native coronary artery without angina pectoris: Secondary | ICD-10-CM | POA: Diagnosis not present

## 2021-02-21 DIAGNOSIS — I5032 Chronic diastolic (congestive) heart failure: Secondary | ICD-10-CM | POA: Diagnosis not present

## 2021-02-21 DIAGNOSIS — K8031 Calculus of bile duct with cholangitis, unspecified, with obstruction: Secondary | ICD-10-CM | POA: Diagnosis not present

## 2021-02-21 DIAGNOSIS — I7143 Infrarenal abdominal aortic aneurysm, without rupture: Secondary | ICD-10-CM | POA: Diagnosis not present

## 2021-02-21 DIAGNOSIS — N189 Chronic kidney disease, unspecified: Secondary | ICD-10-CM | POA: Diagnosis not present

## 2021-02-22 DIAGNOSIS — I129 Hypertensive chronic kidney disease with stage 1 through stage 4 chronic kidney disease, or unspecified chronic kidney disease: Secondary | ICD-10-CM | POA: Diagnosis not present

## 2021-02-22 DIAGNOSIS — I10 Essential (primary) hypertension: Secondary | ICD-10-CM | POA: Diagnosis not present

## 2021-02-22 DIAGNOSIS — I480 Paroxysmal atrial fibrillation: Secondary | ICD-10-CM | POA: Diagnosis not present

## 2021-02-22 DIAGNOSIS — R7989 Other specified abnormal findings of blood chemistry: Secondary | ICD-10-CM | POA: Diagnosis not present

## 2021-02-22 DIAGNOSIS — E782 Mixed hyperlipidemia: Secondary | ICD-10-CM | POA: Diagnosis not present

## 2021-02-23 DIAGNOSIS — I7143 Infrarenal abdominal aortic aneurysm, without rupture: Secondary | ICD-10-CM | POA: Diagnosis not present

## 2021-02-23 DIAGNOSIS — N189 Chronic kidney disease, unspecified: Secondary | ICD-10-CM | POA: Diagnosis not present

## 2021-02-23 DIAGNOSIS — I2510 Atherosclerotic heart disease of native coronary artery without angina pectoris: Secondary | ICD-10-CM | POA: Diagnosis not present

## 2021-02-23 DIAGNOSIS — K8031 Calculus of bile duct with cholangitis, unspecified, with obstruction: Secondary | ICD-10-CM | POA: Diagnosis not present

## 2021-02-23 DIAGNOSIS — I130 Hypertensive heart and chronic kidney disease with heart failure and stage 1 through stage 4 chronic kidney disease, or unspecified chronic kidney disease: Secondary | ICD-10-CM | POA: Diagnosis not present

## 2021-02-23 DIAGNOSIS — I5032 Chronic diastolic (congestive) heart failure: Secondary | ICD-10-CM | POA: Diagnosis not present

## 2021-02-26 DIAGNOSIS — I130 Hypertensive heart and chronic kidney disease with heart failure and stage 1 through stage 4 chronic kidney disease, or unspecified chronic kidney disease: Secondary | ICD-10-CM | POA: Diagnosis not present

## 2021-02-26 DIAGNOSIS — N189 Chronic kidney disease, unspecified: Secondary | ICD-10-CM | POA: Diagnosis not present

## 2021-02-26 DIAGNOSIS — I7143 Infrarenal abdominal aortic aneurysm, without rupture: Secondary | ICD-10-CM | POA: Diagnosis not present

## 2021-02-26 DIAGNOSIS — I2510 Atherosclerotic heart disease of native coronary artery without angina pectoris: Secondary | ICD-10-CM | POA: Diagnosis not present

## 2021-02-26 DIAGNOSIS — I5032 Chronic diastolic (congestive) heart failure: Secondary | ICD-10-CM | POA: Diagnosis not present

## 2021-02-26 DIAGNOSIS — K8031 Calculus of bile duct with cholangitis, unspecified, with obstruction: Secondary | ICD-10-CM | POA: Diagnosis not present

## 2021-02-27 DIAGNOSIS — I7143 Infrarenal abdominal aortic aneurysm, without rupture: Secondary | ICD-10-CM | POA: Diagnosis not present

## 2021-02-27 DIAGNOSIS — N189 Chronic kidney disease, unspecified: Secondary | ICD-10-CM | POA: Diagnosis not present

## 2021-02-27 DIAGNOSIS — I5032 Chronic diastolic (congestive) heart failure: Secondary | ICD-10-CM | POA: Diagnosis not present

## 2021-02-27 DIAGNOSIS — K8031 Calculus of bile duct with cholangitis, unspecified, with obstruction: Secondary | ICD-10-CM | POA: Diagnosis not present

## 2021-02-27 DIAGNOSIS — I130 Hypertensive heart and chronic kidney disease with heart failure and stage 1 through stage 4 chronic kidney disease, or unspecified chronic kidney disease: Secondary | ICD-10-CM | POA: Diagnosis not present

## 2021-02-27 DIAGNOSIS — I2510 Atherosclerotic heart disease of native coronary artery without angina pectoris: Secondary | ICD-10-CM | POA: Diagnosis not present

## 2021-02-28 DIAGNOSIS — N189 Chronic kidney disease, unspecified: Secondary | ICD-10-CM | POA: Diagnosis not present

## 2021-02-28 DIAGNOSIS — I2510 Atherosclerotic heart disease of native coronary artery without angina pectoris: Secondary | ICD-10-CM | POA: Diagnosis not present

## 2021-02-28 DIAGNOSIS — I5032 Chronic diastolic (congestive) heart failure: Secondary | ICD-10-CM | POA: Diagnosis not present

## 2021-02-28 DIAGNOSIS — I7143 Infrarenal abdominal aortic aneurysm, without rupture: Secondary | ICD-10-CM | POA: Diagnosis not present

## 2021-02-28 DIAGNOSIS — K8031 Calculus of bile duct with cholangitis, unspecified, with obstruction: Secondary | ICD-10-CM | POA: Diagnosis not present

## 2021-02-28 DIAGNOSIS — I130 Hypertensive heart and chronic kidney disease with heart failure and stage 1 through stage 4 chronic kidney disease, or unspecified chronic kidney disease: Secondary | ICD-10-CM | POA: Diagnosis not present

## 2021-03-01 DIAGNOSIS — I130 Hypertensive heart and chronic kidney disease with heart failure and stage 1 through stage 4 chronic kidney disease, or unspecified chronic kidney disease: Secondary | ICD-10-CM | POA: Diagnosis not present

## 2021-03-01 DIAGNOSIS — I2510 Atherosclerotic heart disease of native coronary artery without angina pectoris: Secondary | ICD-10-CM | POA: Diagnosis not present

## 2021-03-01 DIAGNOSIS — I7143 Infrarenal abdominal aortic aneurysm, without rupture: Secondary | ICD-10-CM | POA: Diagnosis not present

## 2021-03-01 DIAGNOSIS — I5032 Chronic diastolic (congestive) heart failure: Secondary | ICD-10-CM | POA: Diagnosis not present

## 2021-03-01 DIAGNOSIS — K8031 Calculus of bile duct with cholangitis, unspecified, with obstruction: Secondary | ICD-10-CM | POA: Diagnosis not present

## 2021-03-01 DIAGNOSIS — N189 Chronic kidney disease, unspecified: Secondary | ICD-10-CM | POA: Diagnosis not present

## 2021-03-02 DIAGNOSIS — I2510 Atherosclerotic heart disease of native coronary artery without angina pectoris: Secondary | ICD-10-CM | POA: Diagnosis not present

## 2021-03-02 DIAGNOSIS — N189 Chronic kidney disease, unspecified: Secondary | ICD-10-CM | POA: Diagnosis not present

## 2021-03-02 DIAGNOSIS — I5032 Chronic diastolic (congestive) heart failure: Secondary | ICD-10-CM | POA: Diagnosis not present

## 2021-03-02 DIAGNOSIS — K8031 Calculus of bile duct with cholangitis, unspecified, with obstruction: Secondary | ICD-10-CM | POA: Diagnosis not present

## 2021-03-02 DIAGNOSIS — I7143 Infrarenal abdominal aortic aneurysm, without rupture: Secondary | ICD-10-CM | POA: Diagnosis not present

## 2021-03-02 DIAGNOSIS — I130 Hypertensive heart and chronic kidney disease with heart failure and stage 1 through stage 4 chronic kidney disease, or unspecified chronic kidney disease: Secondary | ICD-10-CM | POA: Diagnosis not present

## 2021-03-05 DIAGNOSIS — Z23 Encounter for immunization: Secondary | ICD-10-CM | POA: Diagnosis not present

## 2021-03-05 DIAGNOSIS — K8031 Calculus of bile duct with cholangitis, unspecified, with obstruction: Secondary | ICD-10-CM | POA: Diagnosis not present

## 2021-03-05 DIAGNOSIS — I5032 Chronic diastolic (congestive) heart failure: Secondary | ICD-10-CM | POA: Diagnosis not present

## 2021-03-05 DIAGNOSIS — N189 Chronic kidney disease, unspecified: Secondary | ICD-10-CM | POA: Diagnosis not present

## 2021-03-05 DIAGNOSIS — I7143 Infrarenal abdominal aortic aneurysm, without rupture: Secondary | ICD-10-CM | POA: Diagnosis not present

## 2021-03-05 DIAGNOSIS — I2510 Atherosclerotic heart disease of native coronary artery without angina pectoris: Secondary | ICD-10-CM | POA: Diagnosis not present

## 2021-03-05 DIAGNOSIS — I130 Hypertensive heart and chronic kidney disease with heart failure and stage 1 through stage 4 chronic kidney disease, or unspecified chronic kidney disease: Secondary | ICD-10-CM | POA: Diagnosis not present

## 2021-03-06 DIAGNOSIS — N189 Chronic kidney disease, unspecified: Secondary | ICD-10-CM | POA: Diagnosis not present

## 2021-03-06 DIAGNOSIS — I7143 Infrarenal abdominal aortic aneurysm, without rupture: Secondary | ICD-10-CM | POA: Diagnosis not present

## 2021-03-06 DIAGNOSIS — I5032 Chronic diastolic (congestive) heart failure: Secondary | ICD-10-CM | POA: Diagnosis not present

## 2021-03-06 DIAGNOSIS — K8031 Calculus of bile duct with cholangitis, unspecified, with obstruction: Secondary | ICD-10-CM | POA: Diagnosis not present

## 2021-03-06 DIAGNOSIS — I130 Hypertensive heart and chronic kidney disease with heart failure and stage 1 through stage 4 chronic kidney disease, or unspecified chronic kidney disease: Secondary | ICD-10-CM | POA: Diagnosis not present

## 2021-03-06 DIAGNOSIS — I2510 Atherosclerotic heart disease of native coronary artery without angina pectoris: Secondary | ICD-10-CM | POA: Diagnosis not present

## 2021-03-07 DIAGNOSIS — N189 Chronic kidney disease, unspecified: Secondary | ICD-10-CM | POA: Diagnosis not present

## 2021-03-07 DIAGNOSIS — I130 Hypertensive heart and chronic kidney disease with heart failure and stage 1 through stage 4 chronic kidney disease, or unspecified chronic kidney disease: Secondary | ICD-10-CM | POA: Diagnosis not present

## 2021-03-07 DIAGNOSIS — I5032 Chronic diastolic (congestive) heart failure: Secondary | ICD-10-CM | POA: Diagnosis not present

## 2021-03-07 DIAGNOSIS — I2510 Atherosclerotic heart disease of native coronary artery without angina pectoris: Secondary | ICD-10-CM | POA: Diagnosis not present

## 2021-03-07 DIAGNOSIS — K8031 Calculus of bile duct with cholangitis, unspecified, with obstruction: Secondary | ICD-10-CM | POA: Diagnosis not present

## 2021-03-07 DIAGNOSIS — I7143 Infrarenal abdominal aortic aneurysm, without rupture: Secondary | ICD-10-CM | POA: Diagnosis not present

## 2021-03-08 DIAGNOSIS — I130 Hypertensive heart and chronic kidney disease with heart failure and stage 1 through stage 4 chronic kidney disease, or unspecified chronic kidney disease: Secondary | ICD-10-CM | POA: Diagnosis not present

## 2021-03-08 DIAGNOSIS — I7143 Infrarenal abdominal aortic aneurysm, without rupture: Secondary | ICD-10-CM | POA: Diagnosis not present

## 2021-03-08 DIAGNOSIS — K8031 Calculus of bile duct with cholangitis, unspecified, with obstruction: Secondary | ICD-10-CM | POA: Diagnosis not present

## 2021-03-08 DIAGNOSIS — I2510 Atherosclerotic heart disease of native coronary artery without angina pectoris: Secondary | ICD-10-CM | POA: Diagnosis not present

## 2021-03-08 DIAGNOSIS — I5032 Chronic diastolic (congestive) heart failure: Secondary | ICD-10-CM | POA: Diagnosis not present

## 2021-03-08 DIAGNOSIS — N189 Chronic kidney disease, unspecified: Secondary | ICD-10-CM | POA: Diagnosis not present

## 2021-03-12 ENCOUNTER — Ambulatory Visit (INDEPENDENT_AMBULATORY_CARE_PROVIDER_SITE_OTHER): Payer: Medicare Other

## 2021-03-12 DIAGNOSIS — I5032 Chronic diastolic (congestive) heart failure: Secondary | ICD-10-CM

## 2021-03-13 DIAGNOSIS — I5032 Chronic diastolic (congestive) heart failure: Secondary | ICD-10-CM | POA: Diagnosis not present

## 2021-03-13 DIAGNOSIS — N189 Chronic kidney disease, unspecified: Secondary | ICD-10-CM | POA: Diagnosis not present

## 2021-03-13 DIAGNOSIS — I130 Hypertensive heart and chronic kidney disease with heart failure and stage 1 through stage 4 chronic kidney disease, or unspecified chronic kidney disease: Secondary | ICD-10-CM | POA: Diagnosis not present

## 2021-03-13 DIAGNOSIS — K8031 Calculus of bile duct with cholangitis, unspecified, with obstruction: Secondary | ICD-10-CM | POA: Diagnosis not present

## 2021-03-13 DIAGNOSIS — I7143 Infrarenal abdominal aortic aneurysm, without rupture: Secondary | ICD-10-CM | POA: Diagnosis not present

## 2021-03-13 DIAGNOSIS — I2510 Atherosclerotic heart disease of native coronary artery without angina pectoris: Secondary | ICD-10-CM | POA: Diagnosis not present

## 2021-03-13 LAB — CUP PACEART REMOTE DEVICE CHECK
Battery Remaining Longevity: 3 mo — CL
Battery Remaining Percentage: 0 %
Brady Statistic RA Percent Paced: 0 %
Brady Statistic RV Percent Paced: 99 %
Date Time Interrogation Session: 20221031015600
Implantable Lead Implant Date: 20131211
Implantable Lead Implant Date: 20131211
Implantable Lead Location: 753859
Implantable Lead Location: 753860
Implantable Lead Model: 4135
Implantable Lead Model: 4136
Implantable Lead Serial Number: 29296655
Implantable Lead Serial Number: 29317536
Implantable Pulse Generator Implant Date: 20131211
Lead Channel Impedance Value: 552 Ohm
Lead Channel Impedance Value: 583 Ohm
Lead Channel Pacing Threshold Amplitude: 0.9 V
Lead Channel Pacing Threshold Pulse Width: 0.5 ms
Lead Channel Setting Pacing Amplitude: 2.4 V
Lead Channel Setting Pacing Pulse Width: 0.5 ms
Lead Channel Setting Sensing Sensitivity: 2.5 mV
Pulse Gen Serial Number: 144309

## 2021-03-15 DIAGNOSIS — R443 Hallucinations, unspecified: Secondary | ICD-10-CM | POA: Diagnosis not present

## 2021-03-15 DIAGNOSIS — J449 Chronic obstructive pulmonary disease, unspecified: Secondary | ICD-10-CM | POA: Diagnosis not present

## 2021-03-15 DIAGNOSIS — J309 Allergic rhinitis, unspecified: Secondary | ICD-10-CM | POA: Diagnosis not present

## 2021-03-15 DIAGNOSIS — I5032 Chronic diastolic (congestive) heart failure: Secondary | ICD-10-CM | POA: Diagnosis not present

## 2021-03-15 DIAGNOSIS — M1990 Unspecified osteoarthritis, unspecified site: Secondary | ICD-10-CM | POA: Diagnosis not present

## 2021-03-15 DIAGNOSIS — I130 Hypertensive heart and chronic kidney disease with heart failure and stage 1 through stage 4 chronic kidney disease, or unspecified chronic kidney disease: Secondary | ICD-10-CM | POA: Diagnosis not present

## 2021-03-15 DIAGNOSIS — I4819 Other persistent atrial fibrillation: Secondary | ICD-10-CM | POA: Diagnosis not present

## 2021-03-15 DIAGNOSIS — I71012 Dissection of descending thoracic aorta: Secondary | ICD-10-CM | POA: Diagnosis not present

## 2021-03-15 DIAGNOSIS — L89152 Pressure ulcer of sacral region, stage 2: Secondary | ICD-10-CM | POA: Diagnosis not present

## 2021-03-15 DIAGNOSIS — I2510 Atherosclerotic heart disease of native coronary artery without angina pectoris: Secondary | ICD-10-CM | POA: Diagnosis not present

## 2021-03-15 DIAGNOSIS — E785 Hyperlipidemia, unspecified: Secondary | ICD-10-CM | POA: Diagnosis not present

## 2021-03-15 DIAGNOSIS — I7143 Infrarenal abdominal aortic aneurysm, without rupture: Secondary | ICD-10-CM | POA: Diagnosis not present

## 2021-03-15 DIAGNOSIS — T40605D Adverse effect of unspecified narcotics, subsequent encounter: Secondary | ICD-10-CM | POA: Diagnosis not present

## 2021-03-15 DIAGNOSIS — I442 Atrioventricular block, complete: Secondary | ICD-10-CM | POA: Diagnosis not present

## 2021-03-15 DIAGNOSIS — I495 Sick sinus syndrome: Secondary | ICD-10-CM | POA: Diagnosis not present

## 2021-03-15 DIAGNOSIS — I773 Arterial fibromuscular dysplasia: Secondary | ICD-10-CM | POA: Diagnosis not present

## 2021-03-15 DIAGNOSIS — N289 Disorder of kidney and ureter, unspecified: Secondary | ICD-10-CM | POA: Diagnosis not present

## 2021-03-15 DIAGNOSIS — N189 Chronic kidney disease, unspecified: Secondary | ICD-10-CM | POA: Diagnosis not present

## 2021-03-15 DIAGNOSIS — R011 Cardiac murmur, unspecified: Secondary | ICD-10-CM | POA: Diagnosis not present

## 2021-03-15 DIAGNOSIS — K219 Gastro-esophageal reflux disease without esophagitis: Secondary | ICD-10-CM | POA: Diagnosis not present

## 2021-03-15 DIAGNOSIS — K8031 Calculus of bile duct with cholangitis, unspecified, with obstruction: Secondary | ICD-10-CM | POA: Diagnosis not present

## 2021-03-15 DIAGNOSIS — K649 Unspecified hemorrhoids: Secondary | ICD-10-CM | POA: Diagnosis not present

## 2021-03-15 DIAGNOSIS — I701 Atherosclerosis of renal artery: Secondary | ICD-10-CM | POA: Diagnosis not present

## 2021-03-15 DIAGNOSIS — I082 Rheumatic disorders of both aortic and tricuspid valves: Secondary | ICD-10-CM | POA: Diagnosis not present

## 2021-03-15 DIAGNOSIS — K5790 Diverticulosis of intestine, part unspecified, without perforation or abscess without bleeding: Secondary | ICD-10-CM | POA: Diagnosis not present

## 2021-03-19 DIAGNOSIS — I10 Essential (primary) hypertension: Secondary | ICD-10-CM | POA: Diagnosis not present

## 2021-03-19 DIAGNOSIS — I5032 Chronic diastolic (congestive) heart failure: Secondary | ICD-10-CM | POA: Diagnosis not present

## 2021-03-19 DIAGNOSIS — I130 Hypertensive heart and chronic kidney disease with heart failure and stage 1 through stage 4 chronic kidney disease, or unspecified chronic kidney disease: Secondary | ICD-10-CM | POA: Diagnosis not present

## 2021-03-19 DIAGNOSIS — E782 Mixed hyperlipidemia: Secondary | ICD-10-CM | POA: Diagnosis not present

## 2021-03-19 DIAGNOSIS — I7143 Infrarenal abdominal aortic aneurysm, without rupture: Secondary | ICD-10-CM | POA: Diagnosis not present

## 2021-03-19 DIAGNOSIS — N189 Chronic kidney disease, unspecified: Secondary | ICD-10-CM | POA: Diagnosis not present

## 2021-03-19 DIAGNOSIS — I700 Atherosclerosis of aorta: Secondary | ICD-10-CM | POA: Diagnosis not present

## 2021-03-19 DIAGNOSIS — I2510 Atherosclerotic heart disease of native coronary artery without angina pectoris: Secondary | ICD-10-CM | POA: Diagnosis not present

## 2021-03-19 DIAGNOSIS — K8031 Calculus of bile duct with cholangitis, unspecified, with obstruction: Secondary | ICD-10-CM | POA: Diagnosis not present

## 2021-03-19 DIAGNOSIS — N1831 Chronic kidney disease, stage 3a: Secondary | ICD-10-CM | POA: Diagnosis not present

## 2021-03-19 NOTE — Addendum Note (Signed)
Addended by: Douglass Rivers D on: 03/19/2021 04:59 PM   Modules accepted: Level of Service

## 2021-03-19 NOTE — Progress Notes (Signed)
Remote pacemaker transmission.   

## 2021-03-20 DIAGNOSIS — I5032 Chronic diastolic (congestive) heart failure: Secondary | ICD-10-CM | POA: Diagnosis not present

## 2021-03-20 DIAGNOSIS — N189 Chronic kidney disease, unspecified: Secondary | ICD-10-CM | POA: Diagnosis not present

## 2021-03-20 DIAGNOSIS — I7143 Infrarenal abdominal aortic aneurysm, without rupture: Secondary | ICD-10-CM | POA: Diagnosis not present

## 2021-03-20 DIAGNOSIS — I130 Hypertensive heart and chronic kidney disease with heart failure and stage 1 through stage 4 chronic kidney disease, or unspecified chronic kidney disease: Secondary | ICD-10-CM | POA: Diagnosis not present

## 2021-03-20 DIAGNOSIS — K8031 Calculus of bile duct with cholangitis, unspecified, with obstruction: Secondary | ICD-10-CM | POA: Diagnosis not present

## 2021-03-20 DIAGNOSIS — I2510 Atherosclerotic heart disease of native coronary artery without angina pectoris: Secondary | ICD-10-CM | POA: Diagnosis not present

## 2021-03-26 DIAGNOSIS — M810 Age-related osteoporosis without current pathological fracture: Secondary | ICD-10-CM | POA: Diagnosis not present

## 2021-03-26 DIAGNOSIS — Z23 Encounter for immunization: Secondary | ICD-10-CM | POA: Diagnosis not present

## 2021-03-26 DIAGNOSIS — I10 Essential (primary) hypertension: Secondary | ICD-10-CM | POA: Diagnosis not present

## 2021-03-26 DIAGNOSIS — E782 Mixed hyperlipidemia: Secondary | ICD-10-CM | POA: Diagnosis not present

## 2021-03-26 DIAGNOSIS — I700 Atherosclerosis of aorta: Secondary | ICD-10-CM | POA: Diagnosis not present

## 2021-03-26 DIAGNOSIS — I712 Thoracic aortic aneurysm, without rupture, unspecified: Secondary | ICD-10-CM | POA: Diagnosis not present

## 2021-03-26 DIAGNOSIS — D6869 Other thrombophilia: Secondary | ICD-10-CM | POA: Diagnosis not present

## 2021-03-26 DIAGNOSIS — J432 Centrilobular emphysema: Secondary | ICD-10-CM | POA: Diagnosis not present

## 2021-03-26 DIAGNOSIS — I714 Abdominal aortic aneurysm, without rupture, unspecified: Secondary | ICD-10-CM | POA: Diagnosis not present

## 2021-03-26 DIAGNOSIS — Z0000 Encounter for general adult medical examination without abnormal findings: Secondary | ICD-10-CM | POA: Diagnosis not present

## 2021-03-26 DIAGNOSIS — I129 Hypertensive chronic kidney disease with stage 1 through stage 4 chronic kidney disease, or unspecified chronic kidney disease: Secondary | ICD-10-CM | POA: Diagnosis not present

## 2021-03-26 DIAGNOSIS — I480 Paroxysmal atrial fibrillation: Secondary | ICD-10-CM | POA: Diagnosis not present

## 2021-03-26 DIAGNOSIS — D692 Other nonthrombocytopenic purpura: Secondary | ICD-10-CM | POA: Diagnosis not present

## 2021-03-27 DIAGNOSIS — I7143 Infrarenal abdominal aortic aneurysm, without rupture: Secondary | ICD-10-CM | POA: Diagnosis not present

## 2021-03-27 DIAGNOSIS — I5032 Chronic diastolic (congestive) heart failure: Secondary | ICD-10-CM | POA: Diagnosis not present

## 2021-03-27 DIAGNOSIS — I2510 Atherosclerotic heart disease of native coronary artery without angina pectoris: Secondary | ICD-10-CM | POA: Diagnosis not present

## 2021-03-27 DIAGNOSIS — K8031 Calculus of bile duct with cholangitis, unspecified, with obstruction: Secondary | ICD-10-CM | POA: Diagnosis not present

## 2021-03-27 DIAGNOSIS — I130 Hypertensive heart and chronic kidney disease with heart failure and stage 1 through stage 4 chronic kidney disease, or unspecified chronic kidney disease: Secondary | ICD-10-CM | POA: Diagnosis not present

## 2021-03-27 DIAGNOSIS — N189 Chronic kidney disease, unspecified: Secondary | ICD-10-CM | POA: Diagnosis not present

## 2021-03-28 DIAGNOSIS — I5032 Chronic diastolic (congestive) heart failure: Secondary | ICD-10-CM | POA: Diagnosis not present

## 2021-03-28 DIAGNOSIS — N189 Chronic kidney disease, unspecified: Secondary | ICD-10-CM | POA: Diagnosis not present

## 2021-03-28 DIAGNOSIS — K8031 Calculus of bile duct with cholangitis, unspecified, with obstruction: Secondary | ICD-10-CM | POA: Diagnosis not present

## 2021-03-28 DIAGNOSIS — I7143 Infrarenal abdominal aortic aneurysm, without rupture: Secondary | ICD-10-CM | POA: Diagnosis not present

## 2021-03-28 DIAGNOSIS — I130 Hypertensive heart and chronic kidney disease with heart failure and stage 1 through stage 4 chronic kidney disease, or unspecified chronic kidney disease: Secondary | ICD-10-CM | POA: Diagnosis not present

## 2021-03-28 DIAGNOSIS — I2510 Atherosclerotic heart disease of native coronary artery without angina pectoris: Secondary | ICD-10-CM | POA: Diagnosis not present

## 2021-03-30 ENCOUNTER — Telehealth: Payer: Self-pay

## 2021-03-30 NOTE — Telephone Encounter (Signed)
Spoke with patient informed her that her pacemaker had reached ERI and to make sure she keeps her appointment with Dr. Sallyanne Kuster on 04/09/21 patient voiced understanding

## 2021-04-02 DIAGNOSIS — I7143 Infrarenal abdominal aortic aneurysm, without rupture: Secondary | ICD-10-CM | POA: Diagnosis not present

## 2021-04-02 DIAGNOSIS — N189 Chronic kidney disease, unspecified: Secondary | ICD-10-CM | POA: Diagnosis not present

## 2021-04-02 DIAGNOSIS — K8031 Calculus of bile duct with cholangitis, unspecified, with obstruction: Secondary | ICD-10-CM | POA: Diagnosis not present

## 2021-04-02 DIAGNOSIS — I5032 Chronic diastolic (congestive) heart failure: Secondary | ICD-10-CM | POA: Diagnosis not present

## 2021-04-02 DIAGNOSIS — I2510 Atherosclerotic heart disease of native coronary artery without angina pectoris: Secondary | ICD-10-CM | POA: Diagnosis not present

## 2021-04-02 DIAGNOSIS — I130 Hypertensive heart and chronic kidney disease with heart failure and stage 1 through stage 4 chronic kidney disease, or unspecified chronic kidney disease: Secondary | ICD-10-CM | POA: Diagnosis not present

## 2021-04-04 DIAGNOSIS — H353122 Nonexudative age-related macular degeneration, left eye, intermediate dry stage: Secondary | ICD-10-CM | POA: Diagnosis not present

## 2021-04-04 DIAGNOSIS — K8031 Calculus of bile duct with cholangitis, unspecified, with obstruction: Secondary | ICD-10-CM | POA: Diagnosis not present

## 2021-04-04 DIAGNOSIS — I130 Hypertensive heart and chronic kidney disease with heart failure and stage 1 through stage 4 chronic kidney disease, or unspecified chronic kidney disease: Secondary | ICD-10-CM | POA: Diagnosis not present

## 2021-04-04 DIAGNOSIS — I5032 Chronic diastolic (congestive) heart failure: Secondary | ICD-10-CM | POA: Diagnosis not present

## 2021-04-04 DIAGNOSIS — I2510 Atherosclerotic heart disease of native coronary artery without angina pectoris: Secondary | ICD-10-CM | POA: Diagnosis not present

## 2021-04-04 DIAGNOSIS — I7143 Infrarenal abdominal aortic aneurysm, without rupture: Secondary | ICD-10-CM | POA: Diagnosis not present

## 2021-04-04 DIAGNOSIS — Z961 Presence of intraocular lens: Secondary | ICD-10-CM | POA: Diagnosis not present

## 2021-04-04 DIAGNOSIS — H31092 Other chorioretinal scars, left eye: Secondary | ICD-10-CM | POA: Diagnosis not present

## 2021-04-04 DIAGNOSIS — N189 Chronic kidney disease, unspecified: Secondary | ICD-10-CM | POA: Diagnosis not present

## 2021-04-09 ENCOUNTER — Encounter: Payer: Self-pay | Admitting: Cardiovascular Disease

## 2021-04-09 ENCOUNTER — Ambulatory Visit (INDEPENDENT_AMBULATORY_CARE_PROVIDER_SITE_OTHER): Payer: Medicare Other | Admitting: Cardiovascular Disease

## 2021-04-09 ENCOUNTER — Other Ambulatory Visit: Payer: Self-pay

## 2021-04-09 VITALS — BP 157/89 | HR 83 | Ht 63.0 in | Wt 122.2 lb

## 2021-04-09 DIAGNOSIS — E7800 Pure hypercholesterolemia, unspecified: Secondary | ICD-10-CM | POA: Diagnosis not present

## 2021-04-09 DIAGNOSIS — I25118 Atherosclerotic heart disease of native coronary artery with other forms of angina pectoris: Secondary | ICD-10-CM

## 2021-04-09 DIAGNOSIS — Z45010 Encounter for checking and testing of cardiac pacemaker pulse generator [battery]: Secondary | ICD-10-CM | POA: Diagnosis not present

## 2021-04-09 DIAGNOSIS — I701 Atherosclerosis of renal artery: Secondary | ICD-10-CM

## 2021-04-09 DIAGNOSIS — I442 Atrioventricular block, complete: Secondary | ICD-10-CM

## 2021-04-09 DIAGNOSIS — I352 Nonrheumatic aortic (valve) stenosis with insufficiency: Secondary | ICD-10-CM

## 2021-04-09 DIAGNOSIS — I7143 Infrarenal abdominal aortic aneurysm, without rupture: Secondary | ICD-10-CM | POA: Diagnosis not present

## 2021-04-09 DIAGNOSIS — I7121 Aneurysm of the ascending aorta, without rupture: Secondary | ICD-10-CM

## 2021-04-09 DIAGNOSIS — Z7901 Long term (current) use of anticoagulants: Secondary | ICD-10-CM | POA: Diagnosis not present

## 2021-04-09 DIAGNOSIS — Z01812 Encounter for preprocedural laboratory examination: Secondary | ICD-10-CM

## 2021-04-09 DIAGNOSIS — Z01818 Encounter for other preprocedural examination: Secondary | ICD-10-CM | POA: Diagnosis not present

## 2021-04-09 DIAGNOSIS — I71012 Dissection of descending thoracic aorta: Secondary | ICD-10-CM | POA: Diagnosis not present

## 2021-04-09 DIAGNOSIS — E875 Hyperkalemia: Secondary | ICD-10-CM

## 2021-04-09 DIAGNOSIS — I5032 Chronic diastolic (congestive) heart failure: Secondary | ICD-10-CM

## 2021-04-09 DIAGNOSIS — I208 Other forms of angina pectoris: Secondary | ICD-10-CM
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
## 2021-04-09 NOTE — Patient Instructions (Signed)
Medication Instructions: Your physician recommends that you continue on your current medications as directed. Please refer to the Current Medication list given to you today.  * If you need a refill on your cardiac medications before your next appointment, please call your pharmacy. *  Labwork: Pre procedure lab work today: BMET & CBC  * Will notify you of abnormal results, otherwise continue current treatment plan.*  Testing/Procedures: Your physician has recommended that you have a pacemaker/defibrillator generator change (battery change). Please follow the instructions below, located under the special instructions section.  Follow-Up: Your physician recommends that you schedule a wound check appointment 10-14 days, after your procedure on 04/16/21, with the device clinic.  Your physician recommends that you schedule a follow up appointment in 91 days, after your procedure on 04/16/21, with Dr. Sallyanne Kuster.  Thank you for choosing [facility]!!      Any Other Special Instructions Will Be Listed Below (If Applicable).    [facility] [place], [place]  [place], [place] [address]  Phone: [phone] Fax: [phone]    Generator Change Procedure Instructions  You are scheduled for a Generator Change (battery change) on  04/16/21  with Dr. Sallyanne Kuster.  1. Please arrive at the [facility], Entrance "A"  at [facility] at  11:30 am on the day of your procedure. (The address is [address])  2. DIET: You may have a light, early breakfast the morning of your procedure. NOTHING TO EAT AFTER 8:00 AM.  3. LABS: Completed  4. MEDICATIONS: Nothing to hold  5.  Plan for an overnight stay.  Bring your insurance cards and a list of you medications.  6.  Wash your chest and neck with surgical scrub the evening before and the morning of your procedure.  Rinse well. Please review the surgical scrub instruction sheet given to  you.   7. Your chest will need to be shaved prior to this procedure (if needed). We ask that you do this yourself at home 1 to 2 days before or if uncomfortable/unable to do yourself, then it will be performed by the hospital staff the day of.  * Special note:  Every effort is made to have your procedure done on time.  Occasionally there are emergencies that present themselves at the hospital that may cause delays.  Please be patient if a delay does occur.                                                                                                           * If you have any questions after you get home, please call Lattie Haw, RN at [phone].    [facility] - Preparing For Surgery  Before surgery, you can play an important role. Because skin is not sterile, your skin needs to be as free of germs as possible. You can reduce the number of germs on your skin by washing with CHG (chlorahexidine gluconate) Soap before surgery.  CHG is an antiseptic cleaner which kills germs and  bonds with the skin to continue killing germs even after washing.   Please do not use if you have an allergy to CHG or antibacterial soaps.  If your skin becomes reddened/irritated stop using the CHG.   Do not shave (including legs and underarms) for at least 48 hours prior to first CHG shower.  It is OK to shave your face.  Please follow these instructions carefully:  1.  Shower the night before surgery and the morning of surgery with CHG.  2.  If you choose to wash your hair, wash your hair first as usual with your normal shampoo.  3.  After you shampoo, rinse your hair and body thoroughly to remove the shampoo.  4.  Use CHG as you would any other liquid soap.  You can apply CHG directly to the skin and wash gently with a clean washcloth. 5.  Apply the CHG Soap to your body ONLY FROM THE NECK DOWN.  Do not use on open wounds or open sores.  Avoid contact with your eyes, ears, mouth and genitals (private parts).    6.   Wash thoroughly, paying special attention to the area where your surgery will be performed.  7.  Thoroughly rinse your body with warm water from the neck down.   8.  DO NOT shower/wash with your normal soap after using and rinsing off the CHG soap.  9.  Pat yourself dry with a clean towel.   10.  Wear clean pajamas.   11.  Place clean sheets on your bed the night of your first shower and do not sleep with pets.  Day of Surgery: Do not apply any deodorants/lotions.  Please wear clean clothes to the hospital/surgery center.
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
## 2021-04-10 DIAGNOSIS — I2510 Atherosclerotic heart disease of native coronary artery without angina pectoris: Secondary | ICD-10-CM | POA: Diagnosis not present

## 2021-04-10 DIAGNOSIS — N189 Chronic kidney disease, unspecified: Secondary | ICD-10-CM | POA: Diagnosis not present

## 2021-04-10 DIAGNOSIS — I130 Hypertensive heart and chronic kidney disease with heart failure and stage 1 through stage 4 chronic kidney disease, or unspecified chronic kidney disease: Secondary | ICD-10-CM | POA: Diagnosis not present

## 2021-04-10 DIAGNOSIS — K8031 Calculus of bile duct with cholangitis, unspecified, with obstruction: Secondary | ICD-10-CM | POA: Diagnosis not present

## 2021-04-10 DIAGNOSIS — I5032 Chronic diastolic (congestive) heart failure: Secondary | ICD-10-CM | POA: Diagnosis not present

## 2021-04-10 DIAGNOSIS — I7143 Infrarenal abdominal aortic aneurysm, without rupture: Secondary | ICD-10-CM | POA: Diagnosis not present

## 2021-04-10 LAB — BASIC METABOLIC PANEL
BUN/Creatinine Ratio: 22 (ref 12–28)
BUN: 20 mg/dL (ref 10–36)
CO2: 22 mmol/L (ref 20–29)
Calcium: 9.1 mg/dL (ref 8.7–10.3)
Chloride: 101 mmol/L (ref 96–106)
Creatinine, Ser: 0.93 mg/dL (ref 0.57–1.00)
Glucose: 85 mg/dL (ref 70–99)
Potassium: 4.8 mmol/L (ref 3.5–5.2)
Sodium: 142 mmol/L (ref 134–144)
eGFR: 57 mL/min/{1.73_m2} — ABNORMAL LOW (ref >59)

## 2021-04-10 LAB — CBC
Hematocrit: 47.1 % — ABNORMAL HIGH (ref 34.0–46.6)
Hemoglobin: 15.4 g/dL (ref 11.1–15.9)
MCH: 31.5 pg (ref 26.6–33.0)
MCHC: 32.7 g/dL (ref 31.5–35.7)
MCV: 96 fL (ref 79–97)
Platelets: 134 10*3/uL — ABNORMAL LOW (ref 150–450)
RBC: 4.89 x10E6/uL (ref 3.77–5.28)
RDW: 12.2 % (ref 11.7–15.4)
WBC: 7.8 10*3/uL (ref 3.4–10.8)

## 2021-04-12 ENCOUNTER — Ambulatory Visit (INDEPENDENT_AMBULATORY_CARE_PROVIDER_SITE_OTHER): Payer: Medicare Other

## 2021-04-12 DIAGNOSIS — I442 Atrioventricular block, complete: Secondary | ICD-10-CM

## 2021-04-12 LAB — CUP PACEART REMOTE DEVICE CHECK
Brady Statistic RA Percent Paced: 100 %
Brady Statistic RV Percent Paced: 99 %
Date Time Interrogation Session: 20221201013100
Implantable Lead Implant Date: 20131211
Implantable Lead Implant Date: 20131211
Implantable Lead Location: 753859
Implantable Lead Location: 753860
Implantable Lead Model: 4135
Implantable Lead Model: 4136
Implantable Lead Serial Number: 29296655
Implantable Lead Serial Number: 29317536
Implantable Pulse Generator Implant Date: 20131211
Lead Channel Impedance Value: 610 Ohm
Lead Channel Impedance Value: 624 Ohm
Lead Channel Pacing Threshold Amplitude: 1.1 V
Lead Channel Pacing Threshold Pulse Width: 0.5 ms
Lead Channel Setting Pacing Amplitude: 2.4 V
Lead Channel Setting Pacing Pulse Width: 0.5 ms
Lead Channel Setting Sensing Sensitivity: 2.5 mV
Pulse Gen Serial Number: 144309

## 2021-04-15 ENCOUNTER — Other Ambulatory Visit: Payer: Self-pay | Admitting: *Deleted

## 2021-04-15 DIAGNOSIS — I442 Atrioventricular block, complete: Secondary | ICD-10-CM

## 2021-04-16 ENCOUNTER — Encounter (HOSPITAL_COMMUNITY)
Admission: RE | Disposition: A | Discharge status: Home / Self Care | Payer: Self-pay | Source: Home / Self Care | Attending: Cardiovascular Disease

## 2021-04-16 ENCOUNTER — Ambulatory Visit (HOSPITAL_COMMUNITY)
Admission: RE | Admit: 2021-04-16 | Discharge: 2021-04-16 | Disposition: A | Discharge status: Home / Self Care | Payer: Medicare Other | Attending: Cardiovascular Disease | Admitting: Cardiovascular Disease

## 2021-04-16 ENCOUNTER — Other Ambulatory Visit: Payer: Self-pay

## 2021-04-16 DIAGNOSIS — I110 Hypertensive heart disease with heart failure: Secondary | ICD-10-CM | POA: Diagnosis not present

## 2021-04-16 DIAGNOSIS — I442 Atrioventricular block, complete: Secondary | ICD-10-CM | POA: Diagnosis not present

## 2021-04-16 DIAGNOSIS — E785 Hyperlipidemia, unspecified: Secondary | ICD-10-CM | POA: Diagnosis not present

## 2021-04-16 DIAGNOSIS — I2510 Atherosclerotic heart disease of native coronary artery without angina pectoris: Secondary | ICD-10-CM | POA: Insufficient documentation

## 2021-04-16 DIAGNOSIS — I4819 Other persistent atrial fibrillation: Secondary | ICD-10-CM | POA: Insufficient documentation

## 2021-04-16 DIAGNOSIS — J449 Chronic obstructive pulmonary disease, unspecified: Secondary | ICD-10-CM | POA: Diagnosis not present

## 2021-04-16 DIAGNOSIS — Z45010 Encounter for checking and testing of cardiac pacemaker pulse generator [battery]: Secondary | ICD-10-CM | POA: Diagnosis not present

## 2021-04-16 DIAGNOSIS — I7121 Aneurysm of the ascending aorta, without rupture: Secondary | ICD-10-CM | POA: Insufficient documentation

## 2021-04-16 DIAGNOSIS — I5032 Chronic diastolic (congestive) heart failure: Secondary | ICD-10-CM | POA: Insufficient documentation

## 2021-04-16 DIAGNOSIS — E875 Hyperkalemia: Secondary | ICD-10-CM | POA: Diagnosis not present

## 2021-04-16 HISTORY — PX: PPM GENERATOR CHANGEOUT: EP1233

## 2021-04-16 SURGERY — PPM GENERATOR CHANGEOUT

## 2021-04-16 MED ORDER — SODIUM CHLORIDE 0.9 % IV SOLN
INTRAVENOUS | Status: DC | PRN
  Administered 2021-04-16: 10 mL/h via INTRAVENOUS

## 2021-04-16 MED ORDER — CEFAZOLIN SODIUM-DEXTROSE 2-4 GM/100ML-% IV SOLN: 2.0000 g | INTRAVENOUS | Status: DC

## 2021-04-16 MED ORDER — CEFAZOLIN SODIUM-DEXTROSE 2-4 GM/100ML-% IV SOLN
INTRAVENOUS | Status: AC
  Filled 2021-04-16: qty 100

## 2021-04-16 MED ORDER — LIDOCAINE HCL (PF) 1 % IJ SOLN
INTRAMUSCULAR | Status: AC
  Filled 2021-04-16: qty 30

## 2021-04-16 MED ORDER — ONDANSETRON HCL 4 MG/2ML IJ SOLN
INTRAMUSCULAR | Status: AC
  Administered 2021-04-16: 4 mg via INTRAVENOUS
  Filled 2021-04-16: qty 2

## 2021-04-16 MED ORDER — CEFAZOLIN SODIUM-DEXTROSE 2-3 GM-%(50ML) IV SOLR
INTRAVENOUS | Status: DC | PRN
  Administered 2021-04-16: 2 g via INTRAVENOUS

## 2021-04-16 MED ORDER — SODIUM CHLORIDE 0.9 % IV SOLN
80.0000 mg | INTRAVENOUS | Status: AC
  Administered 2021-04-16: 80 mg

## 2021-04-16 MED ORDER — CHLORHEXIDINE GLUCONATE 4 % EX LIQD: 4.0000 "application " | Freq: Once | CUTANEOUS | Status: DC

## 2021-04-16 MED ORDER — ACETAMINOPHEN 325 MG PO TABS
325.0000 mg | ORAL_TABLET | ORAL | Status: DC | PRN
  Filled 2021-04-16: qty 2

## 2021-04-16 MED ORDER — SODIUM CHLORIDE 0.9% FLUSH: 3.0000 mL | INTRAVENOUS | Status: DC | PRN

## 2021-04-16 MED ORDER — DABIGATRAN ETEXILATE MESYLATE 75 MG PO CAPS: 75.0000 mg | ORAL_CAPSULE | Freq: Two times a day (BID) | ORAL | Status: DC

## 2021-04-16 MED ORDER — SODIUM CHLORIDE 0.9 % IV SOLN: INTRAVENOUS | Status: DC

## 2021-04-16 MED ORDER — LIDOCAINE HCL (PF) 1 % IJ SOLN
INTRAMUSCULAR | Status: DC | PRN
  Administered 2021-04-16: 20 mL

## 2021-04-16 MED ORDER — SODIUM CHLORIDE 0.9 % IV SOLN
INTRAVENOUS | Status: AC
  Filled 2021-04-16: qty 2

## 2021-04-16 MED ORDER — ONDANSETRON HCL 4 MG/2ML IJ SOLN
4.0000 mg | Freq: Once | INTRAMUSCULAR | Status: AC
Start: 2021-04-16 — End: 2021-04-16

## 2021-04-16 MED ORDER — SODIUM CHLORIDE 0.9 % IV SOLN: 250.0000 mL | INTRAVENOUS | Status: DC | PRN

## 2021-04-16 MED ORDER — ONDANSETRON HCL 4 MG/2ML IJ SOLN: 4.0000 mg | Freq: Four times a day (QID) | INTRAMUSCULAR | Status: DC | PRN

## 2021-04-16 MED ORDER — SODIUM CHLORIDE 0.9 % IV SOLN: INTRAVENOUS | Status: DC | PRN

## 2021-04-16 SURGICAL SUPPLY — 5 items
CABLE SURGICAL S-101-97-12 (CABLE) ×2 IMPLANT
PACEMAKER ACCOLADE GR (Pacemaker) ×2 IMPLANT
PAD DEFIB RADIO PHYSIO CONN (PAD) ×2 IMPLANT
POUCH AIGIS-R ANTIBACT ICD (Mesh General) ×2 IMPLANT
TRAY PACEMAKER INSERTION (PACKS) ×2 IMPLANT

## 2021-04-16 NOTE — Discharge Instructions (Signed)

## 2021-04-16 NOTE — Interval H&P Note (Signed)
History and Physical Interval Note:  04/16/2021 12:39 PM  [name]  has presented today for surgery, with the diagnosis of eri.  The various methods of treatment have been discussed with the patient and family. After consideration of risks, benefits and other options for treatment, the patient has consented to  Procedure(s): PPM GENERATOR CHANGEOUT (N/A) as a surgical intervention.  The patient's history has been reviewed, patient examined, no change in status, stable for surgery.  I have reviewed the patient's chart and labs.  Questions were answered to the patient's satisfaction.     Rilda Bulls

## 2021-04-16 NOTE — Op Note (Signed)
Procedure report  Procedure performed:  Dual chamber pacemaker generator changeout   Reason for procedure:  1. Device generator at elective replacement interval  2. Complete heart block Procedure performed by:  Sanda Klein, MD  Complications:  None  Estimated blood loss:  <5 mL  Medications administered during procedure:  Ancef 2 g intravenously, lidocaine 1% 30 mL locally Device details:   [employer] number [id number], serial number [id number] Right atrial lead (chronic) BSC [id number],  serial number [id number], (implanted 04/22/2012) Right ventricular lead (chronic)  BSC [id number], serial number [id number] (implanted 04/22/2012)  Explanted generator BSC [id number], serial number  [id number] (implanted 04-22-2012)  Procedure details:  After the risks and benefits of the procedure were discussed the patient provided informed consent. She was brought to the cardiac catheter lab in the fasting state. The patient was prepped and draped in usual sterile fashion. Local anesthesia with 1% lidocaine was administered to to the left infraclavicular area. A 5-6cm horizontal incision was made parallel with and 2-3 cm caudal to the left clavicle, in the area of an old scar. Using minimal electrocautery and mostly sharp and blunt dissection the prepectoral pocket was opened carefully to avoid injury to the loops of chronic leads. Extensive dissection was not necessary. The device was explanted. The pocket was carefully inspected for hemostasis and flushed with copious amounts of antibiotic solution.  The leads were disconnected from the old generator and connected to the new generator, with appropriate pacing noted. Testing of the lead parameters via telemetry showed excellent values.   The entire system was then carefully inserted in the pocket with care been taking that the leads and device assumed a comfortable position without pressure on the incision. Great care was taken that the leads be  located deep to the generator. The pocket was then closed in layers using 2 layers of 2-0 Vicryl and 3-0 Vicryl cutaneous steristrips after which a sterile dressing was applied.   At the end of the procedure the following lead parameters were encountered:   Right atrial lead  AFib  Right ventricular lead sensed R waves  paced (dependent), impedance 570 ohms, threshold 1.0 at 0.4 ms pulse width.  Sanda Klein, MD, [facility] [phone] office [phone] pager

## 2021-04-16 NOTE — Progress Notes (Signed)
Pt returned from cath lab and states she is dizzy and nauseated.  She said this is something that happens when she is moved around and then sits up.  Dr Sallyanne Kuster notified.  Zofran given.  Pt states she is feeling better.

## 2021-04-16 NOTE — Progress Notes (Signed)
Pt has eaten some crackers and drank some water.  States she feels back to normal.  Report given to [facility] who will assume care at this time.

## 2021-04-17 ENCOUNTER — Encounter (HOSPITAL_COMMUNITY): Payer: Self-pay | Admitting: Cardiovascular Disease

## 2021-04-24 NOTE — Progress Notes (Signed)
Remote pacemaker transmission.   

## 2021-05-02 ENCOUNTER — Ambulatory Visit (INDEPENDENT_AMBULATORY_CARE_PROVIDER_SITE_OTHER): Payer: Medicare Other

## 2021-05-02 ENCOUNTER — Other Ambulatory Visit: Payer: Self-pay

## 2021-05-02 DIAGNOSIS — I442 Atrioventricular block, complete: Secondary | ICD-10-CM

## 2021-05-02 LAB — CUP PACEART INCLINIC DEVICE CHECK
Date Time Interrogation Session: 20221221101809
Implantable Lead Implant Date: 20131211
Implantable Lead Implant Date: 20131211
Implantable Lead Location: 753859
Implantable Lead Location: 753860
Implantable Lead Model: 4135
Implantable Lead Model: 4136
Implantable Lead Serial Number: 29296655
Implantable Lead Serial Number: 29317536
Implantable Pulse Generator Implant Date: 20221205
Lead Channel Impedance Value: 557 Ohm
Lead Channel Impedance Value: 583 Ohm
Lead Channel Pacing Threshold Amplitude: 1.2 V
Lead Channel Pacing Threshold Pulse Width: 0.4 ms
Lead Channel Setting Pacing Amplitude: 3 V
Lead Channel Setting Pacing Pulse Width: 0.4 ms
Lead Channel Setting Sensing Sensitivity: 3.5 mV
Pulse Gen Serial Number: 646589

## 2021-05-02 NOTE — Patient Instructions (Signed)
   After Your Pacemaker   Monitor your pacemaker site for redness, swelling, and drainage. Call the device clinic at [phone] if you experience these symptoms or fever/chills.  Your incision was closed with Steri-strips or staples:  You may shower 7 days after your procedure and wash your incision with soap and water. Avoid lotions, ointments, or perfumes over your incision until it is well-healed.  You may use a hot tub or a pool after your wound check appointment if the incision is completely closed.   You may drive, unless driving has been restricted by your healthcare providers.   Remote monitoring is used to monitor your pacemaker from home. This monitoring is scheduled every 91 days by our office. It allows us to keep an eye on the functioning of your device to ensure it is working properly. You will routinely see your Electrophysiologist annually (more often if necessary).  

## 2021-05-02 NOTE — Progress Notes (Signed)
Wound check appointment. Steri-strips removed. Wound without redness or edema. Incision edges approximated, wound well healed. Normal device function. Thresholds, sensing, and impedances consistent with implant measurements. Device programmed at 3.0V ~ chronic settings. Histogram distribution appropriate for patient and level of activity. No mode switches or high ventricular rates noted. Patient educated about wound care, arm mobility, lifting restrictions. ROV in 3 months with Dr. Sallyanne Kuster.

## 2021-06-14 ENCOUNTER — Other Ambulatory Visit: Payer: Self-pay

## 2021-06-14 MED ORDER — FUROSEMIDE 40 MG PO TABS: 40.0000 mg | ORAL_TABLET | Freq: Every day | ORAL | 0 refills | Status: DC

## 2021-06-18 ENCOUNTER — Other Ambulatory Visit: Payer: Self-pay

## 2021-06-18 DIAGNOSIS — I480 Paroxysmal atrial fibrillation: Secondary | ICD-10-CM

## 2021-06-18 DIAGNOSIS — Z950 Presence of cardiac pacemaker: Secondary | ICD-10-CM

## 2021-06-18 MED ORDER — METOPROLOL SUCCINATE ER 50 MG PO TB24: ORAL_TABLET | ORAL | 3 refills | Status: DC

## 2021-07-17 ENCOUNTER — Telehealth: Payer: Self-pay | Admitting: Cardiovascular Disease

## 2021-07-17 MED ORDER — DABIGATRAN ETEXILATE MESYLATE 75 MG PO CAPS: 75.0000 mg | ORAL_CAPSULE | Freq: Two times a day (BID) | ORAL | 2 refills | Status: DC

## 2021-07-17 NOTE — Telephone Encounter (Signed)
Called pt and confirmed she will run out of Pradaxa tonight. Karren Cobble, PharmD made aware and stated he would complete PA.  ? ? ? ? ? ? ? ?

## 2021-07-17 NOTE — Telephone Encounter (Signed)
Pt c/o medication issue: ? ?1. Name of Medication: dabigatran (PRADAXA) 75 MG CAPS capsule ? ?2. How are you currently taking this medication (dosage and times per day)? N/A ? ?3. Are you having a reaction (difficulty breathing--STAT)? No  ? ?4. What is your medication issue? Patient is calling stating she runs out of this medication today and she is needing PA before she can get it filled. Please advise.   ?

## 2021-07-17 NOTE — Telephone Encounter (Signed)
Completed PA.  This was response: ? ?This medication or product is on your plan's list of covered drugs. Prior authorization is not required at this time. If your pharmacy has questions regarding the processing of your prescription, please have them call the [facility] pharmacy help desk at [phone]. **Please note: This request was submitted electronically. Formulary lowering, tiering exception, cost reduction and/or pre-benefit determination review (including prospective Medicare hospice reviews) requests cannot be requested using this method of submission. Providers contact us at [phone] for further assistance. ?

## 2021-07-17 NOTE — Telephone Encounter (Signed)
Prescription refill request for Pradaxa received.  ?Indication:Afib  ?Last office visit: 04/09/21 ([place]) ?Weight: 55.3kg ?Scr: 0.93 (04/09/21) ?CrCl: 32.62m/min ? ?Appropriate dose and refill sent to requested pharmacy.  ?

## 2021-07-30 ENCOUNTER — Other Ambulatory Visit: Payer: Self-pay

## 2021-07-30 ENCOUNTER — Encounter: Payer: Self-pay | Admitting: Cardiovascular Disease

## 2021-07-30 ENCOUNTER — Ambulatory Visit (INDEPENDENT_AMBULATORY_CARE_PROVIDER_SITE_OTHER): Payer: Medicare Other | Admitting: Cardiovascular Disease

## 2021-07-30 VITALS — BP 128/82 | HR 71 | Ht 63.0 in | Wt 121.0 lb

## 2021-07-30 DIAGNOSIS — E7800 Pure hypercholesterolemia, unspecified: Secondary | ICD-10-CM

## 2021-07-30 DIAGNOSIS — I25118 Atherosclerotic heart disease of native coronary artery with other forms of angina pectoris: Secondary | ICD-10-CM

## 2021-07-30 DIAGNOSIS — E875 Hyperkalemia: Secondary | ICD-10-CM

## 2021-07-30 DIAGNOSIS — I442 Atrioventricular block, complete: Secondary | ICD-10-CM

## 2021-07-30 DIAGNOSIS — Z950 Presence of cardiac pacemaker: Secondary | ICD-10-CM

## 2021-07-30 DIAGNOSIS — I7143 Infrarenal abdominal aortic aneurysm, without rupture: Secondary | ICD-10-CM

## 2021-07-30 DIAGNOSIS — I701 Atherosclerosis of renal artery: Secondary | ICD-10-CM

## 2021-07-30 DIAGNOSIS — D6869 Other thrombophilia: Secondary | ICD-10-CM

## 2021-07-30 DIAGNOSIS — I4821 Permanent atrial fibrillation: Secondary | ICD-10-CM

## 2021-07-30 DIAGNOSIS — I5032 Chronic diastolic (congestive) heart failure: Secondary | ICD-10-CM | POA: Diagnosis not present

## 2021-07-30 DIAGNOSIS — I71012 Dissection of descending thoracic aorta: Secondary | ICD-10-CM

## 2021-07-30 DIAGNOSIS — I352 Nonrheumatic aortic (valve) stenosis with insufficiency: Secondary | ICD-10-CM

## 2021-07-30 DIAGNOSIS — I7121 Aneurysm of the ascending aorta, without rupture: Secondary | ICD-10-CM

## 2021-07-30 NOTE — Patient Instructions (Signed)
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
## 2021-08-01 ENCOUNTER — Ambulatory Visit (INDEPENDENT_AMBULATORY_CARE_PROVIDER_SITE_OTHER): Payer: Medicare Other

## 2021-08-01 DIAGNOSIS — Z08 Encounter for follow-up examination after completed treatment for malignant neoplasm: Secondary | ICD-10-CM | POA: Diagnosis not present

## 2021-08-01 DIAGNOSIS — L570 Actinic keratosis: Secondary | ICD-10-CM | POA: Diagnosis not present

## 2021-08-01 DIAGNOSIS — I4821 Permanent atrial fibrillation: Secondary | ICD-10-CM

## 2021-08-01 DIAGNOSIS — L578 Other skin changes due to chronic exposure to nonionizing radiation: Secondary | ICD-10-CM | POA: Diagnosis not present

## 2021-08-01 DIAGNOSIS — Z85828 Personal history of other malignant neoplasm of skin: Secondary | ICD-10-CM | POA: Diagnosis not present

## 2021-08-01 DIAGNOSIS — I442 Atrioventricular block, complete: Secondary | ICD-10-CM

## 2021-08-02 LAB — CUP PACEART REMOTE DEVICE CHECK
Battery Remaining Longevity: 102 mo
Battery Remaining Percentage: 100 %
Brady Statistic RA Percent Paced: 0 %
Brady Statistic RV Percent Paced: 99 %
Date Time Interrogation Session: 20230322014100
Implantable Lead Implant Date: 20131211
Implantable Lead Implant Date: 20131211
Implantable Lead Location: 753859
Implantable Lead Location: 753860
Implantable Lead Model: 4135
Implantable Lead Model: 4136
Implantable Lead Serial Number: 29296655
Implantable Lead Serial Number: 29317536
Implantable Pulse Generator Implant Date: 20221205
Lead Channel Impedance Value: 565 Ohm
Lead Channel Impedance Value: 595 Ohm
Lead Channel Setting Pacing Amplitude: 3 V
Lead Channel Setting Pacing Pulse Width: 0.4 ms
Lead Channel Setting Sensing Sensitivity: 3.5 mV
Pulse Gen Serial Number: 646589

## 2021-08-15 NOTE — Progress Notes (Signed)
Remote pacemaker transmission.   

## 2021-09-11 DIAGNOSIS — I480 Paroxysmal atrial fibrillation: Secondary | ICD-10-CM | POA: Diagnosis not present

## 2021-09-11 DIAGNOSIS — D6869 Other thrombophilia: Secondary | ICD-10-CM | POA: Diagnosis not present

## 2021-09-11 DIAGNOSIS — N1831 Chronic kidney disease, stage 3a: Secondary | ICD-10-CM | POA: Diagnosis not present

## 2021-09-11 DIAGNOSIS — D692 Other nonthrombocytopenic purpura: Secondary | ICD-10-CM | POA: Diagnosis not present

## 2021-09-11 DIAGNOSIS — E782 Mixed hyperlipidemia: Secondary | ICD-10-CM | POA: Diagnosis not present

## 2021-09-11 DIAGNOSIS — I129 Hypertensive chronic kidney disease with stage 1 through stage 4 chronic kidney disease, or unspecified chronic kidney disease: Secondary | ICD-10-CM | POA: Diagnosis not present

## 2021-09-11 DIAGNOSIS — I700 Atherosclerosis of aorta: Secondary | ICD-10-CM | POA: Diagnosis not present

## 2021-09-17 DIAGNOSIS — I7102 Dissection of abdominal aorta: Secondary | ICD-10-CM | POA: Diagnosis not present

## 2021-09-17 DIAGNOSIS — I129 Hypertensive chronic kidney disease with stage 1 through stage 4 chronic kidney disease, or unspecified chronic kidney disease: Secondary | ICD-10-CM | POA: Diagnosis not present

## 2021-09-17 DIAGNOSIS — I10 Essential (primary) hypertension: Secondary | ICD-10-CM | POA: Diagnosis not present

## 2021-09-17 DIAGNOSIS — I700 Atherosclerosis of aorta: Secondary | ICD-10-CM | POA: Diagnosis not present

## 2021-09-17 DIAGNOSIS — E782 Mixed hyperlipidemia: Secondary | ICD-10-CM | POA: Diagnosis not present

## 2021-09-17 DIAGNOSIS — K2100 Gastro-esophageal reflux disease with esophagitis, without bleeding: Secondary | ICD-10-CM | POA: Diagnosis not present

## 2021-09-17 DIAGNOSIS — I480 Paroxysmal atrial fibrillation: Secondary | ICD-10-CM | POA: Diagnosis not present

## 2021-09-17 DIAGNOSIS — N1831 Chronic kidney disease, stage 3a: Secondary | ICD-10-CM | POA: Diagnosis not present

## 2021-09-17 DIAGNOSIS — D6869 Other thrombophilia: Secondary | ICD-10-CM | POA: Diagnosis not present

## 2021-09-17 DIAGNOSIS — D692 Other nonthrombocytopenic purpura: Secondary | ICD-10-CM | POA: Diagnosis not present

## 2021-09-17 DIAGNOSIS — J432 Centrilobular emphysema: Secondary | ICD-10-CM | POA: Diagnosis not present

## 2021-09-17 DIAGNOSIS — N289 Disorder of kidney and ureter, unspecified: Secondary | ICD-10-CM | POA: Diagnosis not present

## 2021-09-21 ENCOUNTER — Telehealth: Payer: Self-pay

## 2021-09-21 NOTE — Telephone Encounter (Signed)
**Note De-Identified  Obfuscation** Pradaxa PA started through covermymeds. ?Key: [id number] ?

## 2021-10-01 DIAGNOSIS — M810 Age-related osteoporosis without current pathological fracture: Secondary | ICD-10-CM | POA: Diagnosis not present

## 2021-10-03 DIAGNOSIS — Z961 Presence of intraocular lens: Secondary | ICD-10-CM | POA: Diagnosis not present

## 2021-10-03 DIAGNOSIS — H31092 Other chorioretinal scars, left eye: Secondary | ICD-10-CM | POA: Diagnosis not present

## 2021-10-03 DIAGNOSIS — H353122 Nonexudative age-related macular degeneration, left eye, intermediate dry stage: Secondary | ICD-10-CM | POA: Diagnosis not present

## 2021-10-04 ENCOUNTER — Telehealth: Payer: Self-pay | Admitting: Cardiovascular Disease

## 2021-10-04 MED ORDER — ISOSORBIDE MONONITRATE ER 30 MG PO TB24: 30.0000 mg | ORAL_TABLET | Freq: Every day | ORAL | 0 refills | Status: DC

## 2021-10-04 NOTE — Telephone Encounter (Signed)
*  STAT* If patient is at the pharmacy, call can be transferred to refill team.   1. Which medications need to be refilled? (please list name of each medication and dose if known) isosorbide mononitrate (IMDUR) 30 MG 24 hr tablet (Expired)  2. Which pharmacy/location (including street and city if local pharmacy) is medication to be sent to? [facility] [place], [place]  3. Do they need a 30 day or 90 day supply? [place]

## 2021-10-12 ENCOUNTER — Emergency Department (HOSPITAL_COMMUNITY): Payer: Medicare Other

## 2021-10-12 ENCOUNTER — Inpatient Hospital Stay (HOSPITAL_COMMUNITY)
Admission: EM | Admit: 2021-10-12 | Discharge: 2021-10-14 | DRG: 149 | Disposition: A | Discharge status: Home Health | Payer: Medicare Other | Attending: Internal Medicine | Admitting: Internal Medicine

## 2021-10-12 ENCOUNTER — Observation Stay (HOSPITAL_COMMUNITY): Payer: Medicare Other

## 2021-10-12 ENCOUNTER — Other Ambulatory Visit: Payer: Self-pay

## 2021-10-12 ENCOUNTER — Encounter (HOSPITAL_COMMUNITY): Payer: Self-pay | Admitting: Emergency Medicine

## 2021-10-12 DIAGNOSIS — I4821 Permanent atrial fibrillation: Secondary | ICD-10-CM | POA: Diagnosis not present

## 2021-10-12 DIAGNOSIS — H8110 Benign paroxysmal vertigo, unspecified ear: Principal | ICD-10-CM | POA: Diagnosis present

## 2021-10-12 DIAGNOSIS — I639 Cerebral infarction, unspecified: Secondary | ICD-10-CM | POA: Diagnosis present

## 2021-10-12 DIAGNOSIS — R059 Cough, unspecified: Secondary | ICD-10-CM | POA: Diagnosis not present

## 2021-10-12 DIAGNOSIS — Z9842 Cataract extraction status, left eye: Secondary | ICD-10-CM

## 2021-10-12 DIAGNOSIS — Z7901 Long term (current) use of anticoagulants: Secondary | ICD-10-CM

## 2021-10-12 DIAGNOSIS — I651 Occlusion and stenosis of basilar artery: Secondary | ICD-10-CM | POA: Diagnosis not present

## 2021-10-12 DIAGNOSIS — I6320 Cerebral infarction due to unspecified occlusion or stenosis of unspecified precerebral arteries: Secondary | ICD-10-CM

## 2021-10-12 DIAGNOSIS — Z888 Allergy status to other drugs, medicaments and biological substances status: Secondary | ICD-10-CM

## 2021-10-12 DIAGNOSIS — J9811 Atelectasis: Secondary | ICD-10-CM | POA: Diagnosis present

## 2021-10-12 DIAGNOSIS — I5032 Chronic diastolic (congestive) heart failure: Secondary | ICD-10-CM | POA: Diagnosis present

## 2021-10-12 DIAGNOSIS — G450 Vertebro-basilar artery syndrome: Secondary | ICD-10-CM | POA: Diagnosis not present

## 2021-10-12 DIAGNOSIS — I4811 Longstanding persistent atrial fibrillation: Secondary | ICD-10-CM | POA: Diagnosis not present

## 2021-10-12 DIAGNOSIS — I714 Abdominal aortic aneurysm, without rupture, unspecified: Secondary | ICD-10-CM | POA: Diagnosis not present

## 2021-10-12 DIAGNOSIS — I700 Atherosclerosis of aorta: Secondary | ICD-10-CM | POA: Diagnosis not present

## 2021-10-12 DIAGNOSIS — E7800 Pure hypercholesterolemia, unspecified: Secondary | ICD-10-CM | POA: Diagnosis present

## 2021-10-12 DIAGNOSIS — Z9841 Cataract extraction status, right eye: Secondary | ICD-10-CM | POA: Diagnosis not present

## 2021-10-12 DIAGNOSIS — R42 Dizziness and giddiness: Secondary | ICD-10-CM

## 2021-10-12 DIAGNOSIS — I110 Hypertensive heart disease with heart failure: Secondary | ICD-10-CM | POA: Diagnosis present

## 2021-10-12 DIAGNOSIS — I6503 Occlusion and stenosis of bilateral vertebral arteries: Secondary | ICD-10-CM | POA: Diagnosis not present

## 2021-10-12 DIAGNOSIS — Z8249 Family history of ischemic heart disease and other diseases of the circulatory system: Secondary | ICD-10-CM

## 2021-10-12 DIAGNOSIS — I10 Essential (primary) hypertension: Secondary | ICD-10-CM | POA: Diagnosis present

## 2021-10-12 DIAGNOSIS — I442 Atrioventricular block, complete: Secondary | ICD-10-CM | POA: Diagnosis present

## 2021-10-12 DIAGNOSIS — I6523 Occlusion and stenosis of bilateral carotid arteries: Secondary | ICD-10-CM | POA: Diagnosis not present

## 2021-10-12 DIAGNOSIS — R0600 Dyspnea, unspecified: Secondary | ICD-10-CM | POA: Diagnosis not present

## 2021-10-12 DIAGNOSIS — Z9049 Acquired absence of other specified parts of digestive tract: Secondary | ICD-10-CM | POA: Diagnosis not present

## 2021-10-12 DIAGNOSIS — I6389 Other cerebral infarction: Secondary | ICD-10-CM | POA: Diagnosis not present

## 2021-10-12 DIAGNOSIS — Z885 Allergy status to narcotic agent status: Secondary | ICD-10-CM | POA: Diagnosis not present

## 2021-10-12 DIAGNOSIS — Z87891 Personal history of nicotine dependence: Secondary | ICD-10-CM | POA: Diagnosis not present

## 2021-10-12 DIAGNOSIS — K219 Gastro-esophageal reflux disease without esophagitis: Secondary | ICD-10-CM | POA: Diagnosis not present

## 2021-10-12 DIAGNOSIS — Z79899 Other long term (current) drug therapy: Secondary | ICD-10-CM

## 2021-10-12 DIAGNOSIS — Z8261 Family history of arthritis: Secondary | ICD-10-CM

## 2021-10-12 DIAGNOSIS — I517 Cardiomegaly: Secondary | ICD-10-CM | POA: Diagnosis not present

## 2021-10-12 DIAGNOSIS — I495 Sick sinus syndrome: Secondary | ICD-10-CM | POA: Diagnosis present

## 2021-10-12 DIAGNOSIS — Z950 Presence of cardiac pacemaker: Secondary | ICD-10-CM | POA: Diagnosis not present

## 2021-10-12 DIAGNOSIS — Z85820 Personal history of malignant melanoma of skin: Secondary | ICD-10-CM

## 2021-10-12 DIAGNOSIS — I2510 Atherosclerotic heart disease of native coronary artery without angina pectoris: Secondary | ICD-10-CM | POA: Diagnosis present

## 2021-10-12 DIAGNOSIS — Z66 Do not resuscitate: Secondary | ICD-10-CM | POA: Diagnosis not present

## 2021-10-12 DIAGNOSIS — I672 Cerebral atherosclerosis: Secondary | ICD-10-CM | POA: Diagnosis not present

## 2021-10-12 DIAGNOSIS — J449 Chronic obstructive pulmonary disease, unspecified: Secondary | ICD-10-CM | POA: Diagnosis present

## 2021-10-12 DIAGNOSIS — Z961 Presence of intraocular lens: Secondary | ICD-10-CM | POA: Diagnosis present

## 2021-10-12 DIAGNOSIS — Z8042 Family history of malignant neoplasm of prostate: Secondary | ICD-10-CM

## 2021-10-12 DIAGNOSIS — Z86010 Personal history of colonic polyps: Secondary | ICD-10-CM

## 2021-10-12 LAB — HEPATIC FUNCTION PANEL
ALT: 18 U/L (ref 0–44)
AST: 30 U/L (ref 15–41)
Albumin: 3.3 g/dL — ABNORMAL LOW (ref 3.5–5.0)
Alkaline Phosphatase: 104 U/L (ref 38–126)
Bilirubin, Direct: 0.4 mg/dL — ABNORMAL HIGH (ref 0.0–0.2)
Indirect Bilirubin: 1.2 mg/dL — ABNORMAL HIGH (ref 0.3–0.9)
Total Bilirubin: 1.6 mg/dL — ABNORMAL HIGH (ref 0.3–1.2)
Total Protein: 5.9 g/dL — ABNORMAL LOW (ref 6.5–8.1)

## 2021-10-12 LAB — BASIC METABOLIC PANEL
Anion gap: 10 (ref 5–15)
BUN: 20 mg/dL (ref 8–23)
CO2: 25 mmol/L (ref 22–32)
Calcium: 9.1 mg/dL (ref 8.9–10.3)
Chloride: 104 mmol/L (ref 98–111)
Creatinine, Ser: 0.87 mg/dL (ref 0.44–1.00)
GFR, Estimated: >60 mL/min (ref >60)
Glucose, Bld: 113 mg/dL — ABNORMAL HIGH (ref 70–99)
Potassium: 4.5 mmol/L (ref 3.5–5.1)
Sodium: 139 mmol/L (ref 135–145)

## 2021-10-12 LAB — CBC
HCT: 46.1 % — ABNORMAL HIGH (ref 36.0–46.0)
Hemoglobin: 14.7 g/dL (ref 12.0–15.0)
MCH: 30.5 pg (ref 26.0–34.0)
MCHC: 31.9 g/dL (ref 30.0–36.0)
MCV: 95.6 fL (ref 80.0–100.0)
Platelets: 159 10*3/uL (ref 150–400)
RBC: 4.82 MIL/uL (ref 3.87–5.11)
RDW: 14.1 % (ref 11.5–15.5)
WBC: 7.2 10*3/uL (ref 4.0–10.5)
nRBC: 0 % (ref 0.0–0.2)

## 2021-10-12 LAB — LIPID PANEL
Cholesterol: 95 mg/dL (ref 0–200)
HDL: 36 mg/dL — ABNORMAL LOW (ref >40)
LDL Cholesterol: 49 mg/dL (ref 0–99)
Total CHOL/HDL Ratio: 2.6 RATIO
Triglycerides: 48 mg/dL (ref <150)
VLDL: 10 mg/dL (ref 0–40)

## 2021-10-12 LAB — TSH: TSH: 1.7 u[IU]/mL (ref 0.350–4.500)

## 2021-10-12 LAB — HEMOGLOBIN A1C
Hgb A1c MFr Bld: 5.7 % — ABNORMAL HIGH (ref 4.8–5.6)
Mean Plasma Glucose: 116.89 mg/dL

## 2021-10-12 MED ORDER — ROSUVASTATIN CALCIUM 5 MG PO TABS
5.0000 mg | ORAL_TABLET | Freq: Every day | ORAL | Status: DC
  Administered 2021-10-12 – 2021-10-13 (×2): 5 mg via ORAL
  Filled 2021-10-12 (×2): qty 1

## 2021-10-12 MED ORDER — SODIUM CHLORIDE 0.9 % IV SOLN: INTRAVENOUS | Status: DC

## 2021-10-12 MED ORDER — ACETAMINOPHEN 160 MG/5ML PO SOLN: 650.0000 mg | ORAL | Status: DC | PRN

## 2021-10-12 MED ORDER — STROKE: EARLY STAGES OF RECOVERY BOOK
Freq: Once | Status: DC
  Filled 2021-10-12: qty 1

## 2021-10-12 MED ORDER — SODIUM CHLORIDE 0.9 % IV SOLN: INTRAVENOUS | Status: AC

## 2021-10-12 MED ORDER — ACETAMINOPHEN 325 MG PO TABS
650.0000 mg | ORAL_TABLET | ORAL | Status: DC | PRN
  Administered 2021-10-13 – 2021-10-14 (×3): 650 mg via ORAL
  Filled 2021-10-12 (×3): qty 2

## 2021-10-12 MED ORDER — MAGNESIUM OXIDE -MG SUPPLEMENT 400 (240 MG) MG PO TABS
400.0000 mg | ORAL_TABLET | Freq: Every day | ORAL | Status: DC
  Administered 2021-10-13 – 2021-10-14 (×2): 400 mg via ORAL
  Filled 2021-10-12 (×2): qty 1

## 2021-10-12 MED ORDER — FLUOROMETHOLONE 0.1 % OP SUSP
1.0000 [drp] | Freq: Two times a day (BID) | OPHTHALMIC | Status: DC
Start: 2021-10-12 — End: 2021-10-14
  Administered 2021-10-12 – 2021-10-14 (×4): 1 [drp] via OPHTHALMIC
  Filled 2021-10-12: qty 5

## 2021-10-12 MED ORDER — ACETAMINOPHEN 650 MG RE SUPP: 650.0000 mg | RECTAL | Status: DC | PRN

## 2021-10-12 MED ORDER — FAMOTIDINE 20 MG PO TABS
20.0000 mg | ORAL_TABLET | Freq: Every day | ORAL | Status: DC
  Administered 2021-10-13 – 2021-10-14 (×2): 20 mg via ORAL
  Filled 2021-10-12 (×2): qty 1

## 2021-10-12 MED ORDER — POLYVINYL ALCOHOL 1.4 % OP SOLN: Freq: Two times a day (BID) | OPHTHALMIC | Status: DC | PRN

## 2021-10-12 MED ORDER — HEPARIN (PORCINE) 25000 UT/250ML-% IV SOLN
850.0000 [IU]/h | INTRAVENOUS | Status: DC
  Administered 2021-10-12: 650 [IU]/h via INTRAVENOUS
  Filled 2021-10-12: qty 250

## 2021-10-12 MED ORDER — FLUTICASONE PROPIONATE 50 MCG/ACT NA SUSP: 1.0000 | Freq: Every day | NASAL | Status: DC | PRN

## 2021-10-12 MED ORDER — IOHEXOL 350 MG/ML SOLN
50.0000 mL | Freq: Once | INTRAVENOUS | Status: AC | PRN
  Administered 2021-10-12: 50 mL via INTRAVENOUS

## 2021-10-12 MED ORDER — HYDRALAZINE HCL 20 MG/ML IJ SOLN: 10.0000 mg | INTRAMUSCULAR | Status: DC | PRN

## 2021-10-12 MED ORDER — KETOCONAZOLE 2 % EX CREA: 1.0000 "application " | TOPICAL_CREAM | Freq: Every day | CUTANEOUS | Status: DC | PRN

## 2021-10-12 NOTE — Progress Notes (Signed)
ANTICOAGULATION CONSULT NOTE - Initial Consult  Pharmacy Consult for Heparin Indication: stroke  Allergies  Allergen Reactions   Tramadol Other (See Comments)    Medication made her feel "off and talk to herself"   Protonix [Pantoprazole Sodium] Other (See Comments)    Abdominal pain   Pantoprazole Other (See Comments)    Other reaction(s): Other (See Comments) Abdominal pain     Patient Measurements: Height: '5\' 3"'$  (160 cm) Weight: 55.3 kg (122 lb) IBW/kg (Calculated) : 52.4 Heparin Dosing Weight: 55 kg  Vital Signs: Temp: 97.9 F (36.6 C) (06/02 0605) BP: 175/97 (06/02 1330) Pulse Rate: 67 (06/02 1330)  Labs: Recent Labs    10/12/21 0615  HGB 14.7  HCT 46.1*  PLT 159  CREATININE 0.87    Estimated Creatinine Clearance: 33.4 mL/min (by C-G formula based on SCr of 0.87 mg/dL).   Medical History: Past Medical History:  Diagnosis Date   Abdominal aortic aneurysm ([place])    Abdominal pain    due to Sequential arterial mediolysis.    Allergic rhinitis    Aneurysm ([place])    reports having liver "aneurysms" for which she underwent coiling   Arthritis    Ascending aortic aneurysm ([place])    Blood transfusion    Cancer ([place])    myelonoma behind eye   Cardiac tamponade, recurrent episode, admitted 9/5, but now felt to be pericarditits 01/17/2012   Chronic atrial fibrillation (HCC)    Chronic diastolic CHF (congestive heart failure) ([place])    Complete heart block ([place]) 01/23/2016   Afib    COPD (chronic obstructive pulmonary disease) ([place])    Dissecting aneurysm of thoracic aorta, Stanford type B ([place]) 10/2017   Diverticulosis of colon (without mention of hemorrhage)    Dysfunction of eustachian tube    Fibromuscular dysplasia (HCC)    GERD (gastroesophageal reflux disease)    Head injury, acute, with loss of consciousness ([place]) 1987    for 4 -5 days. Hit in head with a screw from a swing.   Heart murmur    Hiatal hernia    HTN (hypertension)    Hyperlipidemia     Mild aortic stenosis    Moderate aortic insufficiency    Moderate tricuspid regurgitation    Pacemaker    Persistent atrial fibrillation (HCC)    Personal history of colonic polyps    Tachycardia-bradycardia ([place])    a. s/p PPM.    Medications:  See electronic med rec  Assessment: 88 y.o. F presents with dizziness. Pt on Pradaxa '75mg'$  BID PTA - last taken 6/1 pm. On CTA, found to have a filling defect extending into the basilar which is concerning for thrombus. Neurology has asked pharmacy to start heparin with no bolus and low goal. Pradaxa is being held.  Goal of Therapy:  Heparin level 0.3-0.5 units/ml Monitor platelets by anticoagulation protocol: Yes   Plan:  Heparin gtt at 650 units/hr. No bolus. Will f/u heparin level in 8 hours Daily heparin level and CBC  Sherlon Handing, PharmD, BCPS Please see amion for complete clinical pharmacist phone list 10/12/2021,2:09 PM

## 2021-10-12 NOTE — ED Triage Notes (Signed)
Patient coming from home with [facility] c/o "feeling weird."  Patient reports it started at 0300 when she woke up.  Patient has a history of vertigo but reports it's never been this bad or like this.     180/112 sitting 168/102 standing 98% RA 74 HR 20 RR 120 CBG
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
## 2021-10-12 NOTE — ED Notes (Signed)
Pt currently in ct

## 2021-10-12 NOTE — Evaluation (Signed)
Physical Therapy Evaluation Patient Details Name: [name] MRN: [id number] DOB: Apr 13, 1926 Today's Date: 10/12/2021  History of Present Illness  Pt is a 88 y/o female admitted secondary to increased dizziness. CT negative and pt unable to get MRI secondary to pacemaker. PMH includes HTN, CVA, a fib, CHF, s/p pacemaker, and COPD.  Clinical Impression  Pt admitted secondary to problem above with deficits below. Pt requiring min to min guard A for mobility tasks. Mild dizziness reported, but reports it has improved. Educated about need for assist at d/c and family reports they can provide along with her PCA. Recommending HHPT at d/c to address current deficits. Will continue to follow acutely.        Recommendations for follow up therapy are one component of a multi-disciplinary discharge planning process, led by the attending physician.  Recommendations may be updated based on patient status, additional functional criteria and insurance authorization.  Follow Up Recommendations Home health PT    Assistance Recommended at Discharge Frequent or constant Supervision/Assistance  Patient can return home with the following  A little help with walking and/or transfers;A little help with bathing/dressing/bathroom;Assistance with cooking/housework;Help with stairs or ramp for entrance;Assist for transportation    Equipment Recommendations None recommended by PT  Recommendations for Other Services       Functional Status Assessment Patient has had a recent decline in their functional status and demonstrates the ability to make significant improvements in function in a reasonable and predictable amount of time.     Precautions / Restrictions Precautions Precautions: Fall Restrictions Weight Bearing Restrictions: No      Mobility  Bed Mobility Overal bed mobility: Needs Assistance Bed Mobility: Sit to Supine       Sit to supine: Supervision   General bed mobility comments: Supervision  for safety    Transfers Overall transfer level: Needs assistance Equipment used: None Transfers: Sit to/from Stand Sit to Stand: Min guard           General transfer comment: Min guard for safety.    Ambulation/Gait Ambulation/Gait assistance: Min guard, Min assist Gait Distance (Feet): 30 Feet Assistive device: 1 person hand held assist Gait Pattern/deviations: Step-through pattern, Decreased stride length Gait velocity: Decreased     General Gait Details: Short, cautious steps. Reporting some dizziness, but reports it has improved. Educated about mobility techniques, especially when turning to help with dizziness.  Stairs            Wheelchair Mobility    Modified Rankin (Stroke Patients Only)       Balance Overall balance assessment: Needs assistance Sitting-balance support: No upper extremity supported Sitting balance-Leahy Scale: Good     Standing balance support: Single extremity supported Standing balance-Leahy Scale: Poor Standing balance comment: Reliant on at least 1 UE support and external support                             Pertinent Vitals/Pain Pain Assessment Pain Assessment: No/denies pain    Home Living Family/patient expects to be discharged to:: Private residence Living Arrangements: Alone Available Help at Discharge: Personal care attendant;Family;Available 24 hours/day (PCA present from 8-4 each day) Type of Home: House Home Access: Ramped entrance     Alternate Level Stairs-Number of Steps: chair lift down to basement Home Layout: Two level Home Equipment: Rolling Walker (2 wheels);Tub bench;Rollator (4 wheels);BSC/3in1      Prior Function Prior Level of Function : Needs assist  Mobility Comments: Uses RW for mobility ADLs Comments: Aide assists with IADLs and sometimes assists with bathing     Hand Dominance        Extremity/Trunk Assessment   Upper Extremity Assessment Upper Extremity  Assessment: Defer to OT evaluation    Lower Extremity Assessment Lower Extremity Assessment: Generalized weakness    Cervical / Trunk Assessment Cervical / Trunk Assessment: Kyphotic  Communication   Communication: HOH  Cognition Arousal/Alertness: Awake/alert Behavior During Therapy: WFL for tasks assessed/performed Overall Cognitive Status: Within Functional Limits for tasks assessed                                 General Comments: For basic tasks, did not formally assess        General Comments      Exercises     Assessment/Plan    PT Assessment Patient needs continued PT services  PT Problem List Decreased strength;Decreased activity tolerance;Decreased balance;Decreased mobility       PT Treatment Interventions DME instruction;Gait training;Stair training;Functional mobility training;Therapeutic activities;Therapeutic exercise;Balance training;Patient/family education    PT Goals (Current goals can be found in the Care Plan section)  Acute Rehab PT Goals Patient Stated Goal: to go home PT Goal Formulation: With patient Time For Goal Achievement: 10/26/21 Potential to Achieve Goals: Good    Frequency Min 3X/week     Co-evaluation               AM-PAC PT "6 Clicks" Mobility  Outcome Measure Help needed turning from your back to your side while in a flat bed without using bedrails?: A Little Help needed moving from lying on your back to sitting on the side of a flat bed without using bedrails?: A Little Help needed moving to and from a bed to a chair (including a wheelchair)?: A Little Help needed standing up from a chair using your arms (e.g., wheelchair or bedside chair)?: A Little Help needed to walk in hospital room?: A Little Help needed climbing 3-5 steps with a railing? : A Lot 6 Click Score: 17    End of Session   Activity Tolerance: Patient tolerated treatment well Patient left: in bed;with call bell/phone within reach (on  stretcher in ED) Nurse Communication: Mobility status PT Visit Diagnosis: Unsteadiness on feet (R26.81);Muscle weakness (generalized) (M62.81)    Time: 9758-8325 PT Time Calculation (min) (ACUTE ONLY): 13 min   Charges:   PT Evaluation $PT Eval Low Complexity: 1 Low          Reuel Derby, PT, DPT  Acute Rehabilitation Services  Office: [phone]   Rudean Hitt 10/12/2021, 3:50 PM

## 2021-10-12 NOTE — Consult Note (Addendum)
Neurology Consultation  Reason for Consult: dizziness  Referring Physician: Dr. Tamala Julian   CC: dizziness   History is obtained from:patient and medical record   HPI: [name] is a 88 y.o. female with past medical history of HTN, HLD, Chronic A fib on Pradaxa, CHF, tachy-brady syndrome s/p PPM, aortic dissection, COPD, GERD who presents to [facility] ED via EMS for evaluation of dizziness. She states she wake up @ 0300 am to use the bathroom and upon sitting up on the side of the bed suddenly became very dizzy and unbalanced. Symptoms did not go away and at 0400am she called her son and EMS. She denies numbness, tingling, weakness, slurred speech or facial droop. CT head with no acute process. Neurology consulted for stroke work up   Fisher: yesterday prior to going to bed  tpa given?: no, outside window Premorbid modified Rankin scale (mRS):  4-Needs assistance to walk and tend to bodily needs  ROS: Full ROS was performed and is negative except as noted in the HPI.   Past Medical History:  Diagnosis Date   Abdominal aortic aneurysm ([place])    Abdominal pain    due to Sequential arterial mediolysis.    Allergic rhinitis    Aneurysm ([place])    reports having liver "aneurysms" for which she underwent coiling   Arthritis    Ascending aortic aneurysm ([place])    Blood transfusion    Cancer ([place])    myelonoma behind eye   Cardiac tamponade, recurrent episode, admitted 9/5, but now felt to be pericarditits 01/17/2012   Chronic atrial fibrillation (HCC)    Chronic diastolic CHF (congestive heart failure) ([place])    Complete heart block ([place]) 01/23/2016   Afib    COPD (chronic obstructive pulmonary disease) ([place])    Dissecting aneurysm of thoracic aorta, Stanford type B ([place]) 10/2017   Diverticulosis of colon (without mention of hemorrhage)    Dysfunction of eustachian tube    Fibromuscular dysplasia (HCC)    GERD (gastroesophageal reflux disease)    Head injury, acute, with loss of consciousness ([place])  1987    for 4 -5 days. Hit in head with a screw from a swing.   Heart murmur    Hiatal hernia    HTN (hypertension)    Hyperlipidemia    Mild aortic stenosis    Moderate aortic insufficiency    Moderate tricuspid regurgitation    Pacemaker    Persistent atrial fibrillation (HCC)    Personal history of colonic polyps    Tachycardia-bradycardia ([place])    a. s/p PPM.     Essential (primary) hypertension, Heart failure, unspecified, and Chronic atrial fibrillation   Family History  Problem Relation Age of Onset   Heart disease Mother    Heart failure Father    Prostate cancer Father    Aortic dissection Brother 23   Arthritis Sister    Atrial fibrillation Sister    Colon cancer Neg Hx    Anesthesia problems Neg Hx    Hypotension Neg Hx    Malignant hyperthermia Neg Hx    Pseudochol deficiency Neg Hx      Social History:   reports that she quit smoking about 39 years ago. Her smoking use included cigarettes. She has a 25.00 pack-year smoking history. She has never used smokeless tobacco. She reports that she does not drink alcohol and does not use drugs.  Medications  Current Facility-Administered Medications:     stroke: early stages of recovery book, , Does not apply,  Once, Fuller Plan A, MD   0.9 %  sodium chloride infusion, , Intravenous, Continuous, Fuller Plan A, MD, Last Rate: 75 mL/hr at 10/12/21 1146, New Bag at 10/12/21 1146   acetaminophen (TYLENOL) tablet 650 mg, 650 mg, Oral, Q4H PRN **OR** acetaminophen (TYLENOL) 160 MG/5ML solution 650 mg, 650 mg, Per Tube, Q4H PRN **OR** acetaminophen (TYLENOL) suppository 650 mg, 650 mg, Rectal, Q4H PRN, Norval Morton, MD  Current Outpatient Medications:    acetaminophen (TYLENOL) 500 MG tablet, Take 1 tablet (500 mg total) by mouth every 6 (six) hours as needed for mild pain or fever. No more than total 4 tablet a day, Disp: 100 tablet, Rfl: 2   calcium carbonate (OSCAL) 1500 (600 Ca) MG TABS tablet, Take 600 mg by  mouth daily., Disp: , Rfl:    Calcium Carbonate-Vitamin D (CALCIUM + D PO), Take 1 tablet by mouth 2 (two) times daily., Disp: , Rfl:    dabigatran (PRADAXA) 75 MG CAPS capsule, Take 1 capsule (75 mg total) by mouth every 12 (twelve) hours., Disp: 60 capsule, Rfl: 2   famotidine (PEPCID) 20 MG tablet, Take 1 tablet (20 mg total) by mouth daily., Disp: 30 tablet, Rfl: 0   fluorometholone (FML) 0.1 % ophthalmic suspension, Place 1 drop into both eyes 2 (two) times daily., Disp: , Rfl:    fluticasone (FLONASE) 50 MCG/ACT nasal spray, Place 1 spray into both nostrils daily as needed for allergies or rhinitis., Disp: , Rfl:    furosemide (LASIX) 40 MG tablet, Take 1 tablet (40 mg total) by mouth daily., Disp: 90 tablet, Rfl: 0   isosorbide mononitrate (IMDUR) 30 MG 24 hr tablet, Take 1 tablet (30 mg total) by mouth daily., Disp: 90 tablet, Rfl: 0   ketoconazole (NIZORAL) 2 % cream, 1 application daily as needed for irritation., Disp: , Rfl:    magnesium oxide (MAG-OX) 400 (241.3 Mg) MG tablet, Take 1 tablet (400 mg total) by mouth daily., Disp: 30 tablet, Rfl: 6   metoprolol succinate (TOPROL-XL) 50 MG 24 hr tablet, TAKE 1 TABLET BY MOUTH TWICE DAILY. TAKE WITH OR IMMEDIATELY FOLLOWING A MEAL, Disp: 180 tablet, Rfl: 3   Multiple Vitamin (MULTIVITAMIN WITH MINERALS) TABS tablet, Take 1 tablet by mouth daily., Disp: , Rfl:    Multiple Vitamins-Minerals (PRESERVISION AREDS 2) CAPS, Take 1 capsule by mouth 2 (two) times daily. , Disp: , Rfl:    nitroGLYCERIN (NITROSTAT) 0.4 MG SL tablet, Place 1 tablet (0.4 mg total) under the tongue every 5 (five) minutes as needed for chest pain. DO NOT TAKE MORE THAN 3 DOSES WITHIN 15 MINS, Disp: 90 tablet, Rfl: 3   Omega-3 Fatty Acids (FISH OIL) 1000 MG CAPS, Take 1,000 mg by mouth daily., Disp: , Rfl:    omeprazole (PRILOSEC) 40 MG capsule, Take 40 mg by mouth daily., Disp: , Rfl:    Polyethyl Glycol-Propyl Glycol (SYSTANE OP), Place 2 drops into both eyes 2 (two) times  daily as needed (for dry eyes)., Disp: , Rfl:    potassium chloride (KLOR-CON) 10 MEQ tablet, TAKE 1 TABLET(10 MEQ) BY MOUTH DAILY (Patient taking differently: Take 10 mEq by mouth daily. TAKE 1 TABLET(10 MEQ) BY MOUTH DAILY), Disp: 90 tablet, Rfl: 3   rosuvastatin (CRESTOR) 5 MG tablet, Take 5 mg by mouth daily., Disp: , Rfl:    Exam: Current vital signs: BP (!) 173/85   Pulse 70   Temp 97.9 F (36.6 C)   Resp (!) 33   Ht '5\' 3"'$  (1.6  m)   Wt 55.3 kg   SpO2 95%   BMI 21.61 kg/m  Vital signs in last 24 hours: Temp:  [97.9 F (36.6 C)] 97.9 F (36.6 C) (06/02 0605) Pulse Rate:  [68-71] 70 (06/02 0900) Resp:  [11-34] 33 (06/02 0900) BP: (151-178)/(77-100) 173/85 (06/02 0715) SpO2:  [95 %-97 %] 95 % (06/02 0900) Weight:  [55.3 kg] 55.3 kg (06/02 0604)  GENERAL: Awake, alert in NAD HEENT: - Normocephalic and atraumatic, dry mm LUNGS - Clear to auscultation bilaterally with no wheezes CV - S1S2 RRR, no m/r/g, equal pulses bilaterally. ABDOMEN - Soft, nontender, nondistended with normoactive BS Ext: warm, well perfused, intact peripheral pulses, slight edema, non pitting bilateral lower   NEURO:  Mental Status: AA&Ox4 Language: speech is clear.  Naming, repetition, fluency, and comprehension intact. Cranial Nerves: PERRL 2 mm/brisk. EOMI, visual fields full, no facial asymmetry, facial sensation intact, hearing intact, tongue/uvula/soft palate midline, normal sternocleidomastoid and trapezius muscle strength. No evidence of tongue atrophy or fibrillations Motor: 5/5 bilateral uppers, 4/5 bilateral lowers  Tone: is normal and bulk is normal Sensation- Intact to light touch bilaterally Coordination: FTN intact bilaterally, no ataxia in BLE. Gait- deferred  NIHSS 1a Level of Conscious.: 0 1b LOC Questions: 0 1c LOC Commands: 0 2 Best Gaze: 0 3 Visual: 0 4 Facial Palsy: 0 5a Motor Arm - left: 0 5b Motor Arm - Right: 0 6a Motor Leg - Left: 0 6b Motor Leg - Right: 0 7 Limb  Ataxia: 0 8 Sensory: 0 9 Best Language: 0 10 Dysarthria: 0 11 Extinct. and Inatten.: 0 TOTAL: 0   Imaging I have reviewed the images obtained:  CT-head  No acute process   Assessment:  [name] is a 88 y.o. female with past medical history of HTN, HLD, Chronic A fib on Pradaxa, CHF, tachy-brady syndrome s/p PPM, aortic dissection, COPD, GERD who presents to [facility] ED via EMS for evaluation of dizziness. She states she wake up @ 0300 am to use the bathroom and upon sitting up on the side of the bed suddenly became very dizzy and unbalanced. Of note PPM is not MRI compatible.   Acute/ subacute posterior cardioembolic ischemic infarct likely related to chronic A fib infarct Possible subocclusive thrombus in the basilar artery-possible vertebrobasilar insufficiency. Other differentials include peripheral dizziness  Recommendations: - Admit to medicine for stroke workup  - HgbA1c, fasting lipid panel - Frequent neuro checks - Repeat CT head in 24 hrs due to inability to have MRI  - Echocardiogram - EKG  - CTA head and neck- ordered  -Stop Pradaxa.  Start heparin drip. - Risk factor modification - Telemetry monitoring - PT consult, OT consult, Speech consult - Stroke team to follow   Beulah Gandy DNP, ACNPC-AG    Attending addendum Patient seen and examined Imaging reviewed CT head with no evidence of acute stroke CT angiography with right vertebral artery severe stenosis versus near occlusion with a filling defect extending into the basilar concerning for thrombus. CT head had a lot of artifact in the posterior fossa for which reason it was repeated-no evidence of acute infarct.  MRI not possible due to noncompatible leads. Examination with NIH stroke scale of 0. Discussed the angiography findings with stroke attending on call as well as neuroradiologist. Plan to start heparin drip, stroke work-up. If remains stable, can transition back to Pradaxa on discharge. If worsens,  may need arteriogram. Repeat CT head in 24 hours. Plan discussed with the patient and son at  bedside. Plan relayed to Dr. Tamala Julian Stroke team-Dr. Leonie Man to follow tomorrow  -- Amie Portland, MD Neurologist [facility] Pager: [phone]

## 2021-10-12 NOTE — ED Provider Notes (Signed)
[place] EMERGENCY DEPARTMENT Provider Note   CSN: [id number] Arrival date & time: 10/12/21  0555     History  Chief Complaint  Patient presents with   Dizziness    [name] is a 88 y.o. female.  88 year old female presents today for evaluation of dizziness.  She states she woke up at 3 AM to go to the bathroom when she sat off to the side of the bed the symptoms suddenly came on.  She says she was unable to get out of bed because of the symptoms.  Symptoms lasted until she got to the emergency room before she had any resolution.  At around 4 AM she called her son over because her symptoms persisted and she was unable to ambulate to the bathroom.  She has history of A-fib and is on Pradaxa, complete heart block s/p PPM, CAD, hypertension.  Reports compliance with all of her medications.  She denies associated vision change, headache, chest pain.  States her respiratory status is at baseline.  She endorses lightheadedness with positional changes however states this was not similar to those episodes.  States she has not felt like this before.  The history is provided by the patient. No language interpreter was used.  Dizziness Quality:  Unable to specify Severity:  Moderate Onset quality:  Sudden Duration:  3 hours Timing:  Constant Progression since onset: Resolved, then returned with movement. Chronicity:  New Context: head movement and standing up   Context: not when bending over, not with bowel movement, not with loss of consciousness and not when urinating   Relieved by:  Being still Worsened by:  Eye movement and movement Associated symptoms: shortness of breath (Chronic)   Associated symptoms: no chest pain, no headaches, no nausea, no syncope, no vision changes, no vomiting and no weakness       Home Medications Prior to Admission medications   Medication Sig Start Date End Date Taking? Authorizing Provider  acetaminophen (TYLENOL) 500 MG tablet  Take 1 tablet (500 mg total) by mouth every 6 (six) hours as needed for mild pain or fever. No more than total 4 tablet a day 02/10/21 02/10/22  Barb Merino, MD  calcium carbonate (OSCAL) 1500 (600 Ca) MG TABS tablet Take 600 mg by mouth daily.    [provider]  Calcium Carbonate-Vitamin D (CALCIUM + D PO) Take 1 tablet by mouth 2 (two) times daily.    [provider]  dabigatran (PRADAXA) 75 MG CAPS capsule Take 1 capsule (75 mg total) by mouth every 12 (twelve) hours. 07/17/21   Croitoru, Mihai, MD  famotidine (PEPCID) 20 MG tablet Take 1 tablet (20 mg total) by mouth daily. 02/11/21 07/30/21  Barb Merino, MD  fluorometholone (FML) 0.1 % ophthalmic suspension Place 1 drop into both eyes 2 (two) times daily.    [provider]  fluticasone (FLONASE) 50 MCG/ACT nasal spray Place 1 spray into both nostrils daily as needed for allergies or rhinitis. Patient not taking: Reported on 07/30/2021    [provider]  furosemide (LASIX) 40 MG tablet Take 1 tablet (40 mg total) by mouth daily. 06/14/21   Croitoru, Mihai, MD  isosorbide mononitrate (IMDUR) 30 MG 24 hr tablet Take 1 tablet (30 mg total) by mouth daily. 10/04/21 01/02/22  Croitoru, Mihai, MD  ketoconazole (NIZORAL) 2 % cream 1 application daily as needed for irritation. 11/29/19   [provider]  magnesium oxide (MAG-OX) 400 (241.3 Mg) MG tablet Take 1 tablet (400  mg total) by mouth daily. 08/18/17   Croitoru, Mihai, MD  metoprolol succinate (TOPROL-XL) 50 MG 24 hr tablet TAKE 1 TABLET BY MOUTH TWICE DAILY. TAKE WITH OR IMMEDIATELY FOLLOWING A MEAL 06/18/21   Croitoru, Mihai, MD  Multiple Vitamin (MULTIVITAMIN WITH MINERALS) TABS tablet Take 1 tablet by mouth daily.    [provider]  Multiple Vitamins-Minerals (PRESERVISION AREDS 2) CAPS Take 1 capsule by mouth 2 (two) times daily.     [provider]  nitroGLYCERIN (NITROSTAT) 0.4 MG SL tablet Place 1 tablet (0.4 mg total) under the tongue  every 5 (five) minutes as needed for chest pain. DO NOT TAKE MORE THAN 3 DOSES WITHIN 15 MINS Patient not taking: Reported on 07/30/2021 01/08/21   Croitoru, Mihai, MD  Omega-3 Fatty Acids (FISH OIL) 1000 MG CAPS Take 1,000 mg by mouth daily.    [provider]  omeprazole (PRILOSEC) 40 MG capsule Take 40 mg by mouth daily. 03/26/21   [provider]  Polyethyl Glycol-Propyl Glycol (SYSTANE OP) Place 2 drops into both eyes 2 (two) times daily as needed (for dry eyes).    [provider]  potassium chloride (KLOR-CON) 10 MEQ tablet TAKE 1 TABLET(10 MEQ) BY MOUTH DAILY Patient taking differently: Take 10 mEq by mouth daily. 12/08/20   Croitoru, Mihai, MD  rosuvastatin (CRESTOR) 5 MG tablet Take 5 mg by mouth daily. 04/04/21   [provider]      Allergies    Tramadol, Protonix [pantoprazole sodium], and Pantoprazole    Review of Systems   Review of Systems  Constitutional:  Negative for activity change and fever.  Eyes:  Negative for visual disturbance.  Respiratory:  Positive for shortness of breath (Chronic).   Cardiovascular:  Negative for chest pain and syncope.  Gastrointestinal:  Negative for nausea and vomiting.  Neurological:  Positive for dizziness. Negative for syncope, facial asymmetry, weakness and headaches.  All other systems reviewed and are negative.  Physical Exam Updated Vital Signs BP (!) 163/81   Pulse 70   Temp 97.9 F (36.6 C)   Resp 17   Ht '5\' 3"'$  (1.6 m)   Wt 55.3 kg   SpO2 97%   BMI 21.61 kg/m  Physical Exam Vitals and nursing note reviewed.  Constitutional:      General: She is not in acute distress.    Appearance: Normal appearance. She is not ill-appearing.  HENT:     Head: Normocephalic and atraumatic.  Eyes:     Comments: Right pupil blown.  At baseline.  Patient blind in the right eye secondary to macular degeneration.  Left pupil is reactive to light.  Cardiovascular:     Rate and Rhythm: Normal rate. Rhythm  irregular.  Pulmonary:     Effort: Pulmonary effort is normal. No respiratory distress.     Breath sounds: Normal breath sounds. No wheezing.  Musculoskeletal:        General: Normal range of motion.     Cervical back: Normal range of motion.  Skin:    General: Skin is warm.  Neurological:     General: No focal deficit present.     Mental Status: She is alert.     Comments: Cranial nerves III through XII intact.  Full range of motion in bilateral upper and lower extremities with 5/5 strength in extensor and flexor muscle groups.  Sensation symmetrical bilaterally.  Without pronator drift.  Finger-to-nose intact.  Heel-to-shin intact.    ED Results / Procedures / Treatments  Labs (all labs ordered are listed, but only abnormal results are displayed) Labs Reviewed  BASIC METABOLIC PANEL  CBC  URINALYSIS, ROUTINE W REFLEX MICROSCOPIC    EKG EKG Interpretation  Date/Time:  Friday October 12 2021 06:04:16 EDT Ventricular Rate:  70 PR Interval:  276 QRS Duration: 146 QT Interval:  484 QTC Calculation: 523 R Axis:   -83 Text Interpretation: Ventricular paced rhythm Confirmed by Veryl Speak [phone]) on 10/12/2021 6:25:27 AM  Radiology No results found.  Procedures Procedures    Medications Ordered in ED Medications - No data to display  ED Course/ Medical Decision Making/ A&P                           Medical Decision Making Amount and/or Complexity of Data Reviewed Labs: ordered. Radiology: ordered.  Risk Decision regarding hospitalization.   Medical Decision Making / ED Course   This patient presents to the ED for concern of dizziness, this involves an extensive number of treatment options, and is a complaint that carries with it a high risk of complications and morbidity.  The differential diagnosis includes vertigo, CVA, orthostasis  MDM: 88 year old female presents today for evaluation of dizziness.  Onset at 3 AM with positional change however remained  persistent for about 3 hours before resolution upon arriving to the emergency room.  Had 2 recurrences while her stay in the emergency.  Orthostatic negative during my interview.  CT head was read as without acute intracranial findings.  Patient's pacemaker was not compatible with MRI.  Case discussed with Dr. Malen Gauze with neurology who also reviewed CT head and has concern for cerebellar stroke.  Recommends CT angiogram and admission for stroke work-up.  Son is at bedside.  Plan discussed with them and they voiced understanding and are in agreement.  Case discussed with hospitalist who will evaluate patient for admission.  Following discussion with hospitalist device interrogation ordered.   Additional history obtained: -Additional history obtained from chart review -External records from outside source obtained and reviewed including: Chart review including previous notes, labs, imaging, consultation notes   Lab Tests: -I ordered, reviewed, and interpreted labs.   The pertinent results include:   Labs Reviewed  BASIC METABOLIC PANEL - Abnormal; Notable for the following components:      Result Value   Glucose, Bld 113 (*)    All other components within normal limits  CBC - Abnormal; Notable for the following components:   HCT 46.1 (*)    All other components within normal limits  URINALYSIS, ROUTINE W REFLEX MICROSCOPIC      EKG  EKG Interpretation  Date/Time:  Friday October 12 2021 06:04:16 EDT Ventricular Rate:  70 PR Interval:  276 QRS Duration: 146 QT Interval:  484 QTC Calculation: 523 R Axis:   -83 Text Interpretation: Ventricular paced rhythm Confirmed by Veryl Speak [phone]) on 10/12/2021 6:25:27 AM         Imaging Studies ordered: I ordered imaging studies including CT head, CT angio of head and neck I independently visualized and interpreted imaging. I agree with the radiologist interpretation   Medicines ordered and prescription drug management: No orders of  the defined types were placed in this encounter.   -I have reviewed the patients home medicines and have made adjustments as needed  Consultations Obtained: I requested consultation with the neurology,  and discussed lab and imaging findings as well as pertinent plan - they recommend: Admission for stroke work-up  Cardiac Monitoring: The patient was maintained on a cardiac monitor.  I personally viewed and interpreted the cardiac monitored which showed an underlying rhythm of: Paced rhythm  Reevaluation: After the interventions noted above, I reevaluated the patient and found that they have :stayed the same  Co morbidities that complicate the patient evaluation  Past Medical History:  Diagnosis Date   Abdominal aortic aneurysm ([place])    Abdominal pain    due to Sequential arterial mediolysis.    Allergic rhinitis    Aneurysm ([place])    reports having liver "aneurysms" for which she underwent coiling   Arthritis    Ascending aortic aneurysm ([place])    Blood transfusion    Cancer ([place])    myelonoma behind eye   Cardiac tamponade, recurrent episode, admitted 9/5, but now felt to be pericarditits 01/17/2012   Chronic atrial fibrillation (HCC)    Chronic diastolic CHF (congestive heart failure) ([place])    Complete heart block ([place]) 01/23/2016   Afib    COPD (chronic obstructive pulmonary disease) ([place])    Dissecting aneurysm of thoracic aorta, Stanford type B ([place]) 10/2017   Diverticulosis of colon (without mention of hemorrhage)    Dysfunction of eustachian tube    Fibromuscular dysplasia (HCC)    GERD (gastroesophageal reflux disease)    Head injury, acute, with loss of consciousness ([place]) 1987    for 4 -5 days. Hit in head with a screw from a swing.   Heart murmur    Hiatal hernia    HTN (hypertension)    Hyperlipidemia    Mild aortic stenosis    Moderate aortic insufficiency    Moderate tricuspid regurgitation    Pacemaker    Persistent atrial fibrillation (HCC)    Personal  history of colonic polyps    Tachycardia-bradycardia ([place])    a. s/p PPM.      Dispostion: Discussed with hospitalist who will evaluate patient for admission.   Final Clinical Impression(s) / ED Diagnoses Final diagnoses:  Dizziness    Rx / DC Orders ED Discharge Orders     None         Evlyn Courier, PA-C 10/12/21 1025    Charlesetta Shanks, MD 10/12/21 1145

## 2021-10-13 ENCOUNTER — Inpatient Hospital Stay (HOSPITAL_COMMUNITY): Payer: Medicare Other

## 2021-10-13 ENCOUNTER — Observation Stay (HOSPITAL_COMMUNITY): Payer: Medicare Other

## 2021-10-13 DIAGNOSIS — I639 Cerebral infarction, unspecified: Secondary | ICD-10-CM | POA: Diagnosis not present

## 2021-10-13 DIAGNOSIS — I10 Essential (primary) hypertension: Secondary | ICD-10-CM | POA: Diagnosis not present

## 2021-10-13 DIAGNOSIS — R42 Dizziness and giddiness: Secondary | ICD-10-CM | POA: Diagnosis present

## 2021-10-13 DIAGNOSIS — I6389 Other cerebral infarction: Secondary | ICD-10-CM | POA: Diagnosis not present

## 2021-10-13 DIAGNOSIS — I6320 Cerebral infarction due to unspecified occlusion or stenosis of unspecified precerebral arteries: Secondary | ICD-10-CM | POA: Diagnosis not present

## 2021-10-13 DIAGNOSIS — I4811 Longstanding persistent atrial fibrillation: Secondary | ICD-10-CM | POA: Diagnosis not present

## 2021-10-13 DIAGNOSIS — E7800 Pure hypercholesterolemia, unspecified: Secondary | ICD-10-CM | POA: Diagnosis present

## 2021-10-13 DIAGNOSIS — J449 Chronic obstructive pulmonary disease, unspecified: Secondary | ICD-10-CM | POA: Diagnosis present

## 2021-10-13 DIAGNOSIS — Z7901 Long term (current) use of anticoagulants: Secondary | ICD-10-CM | POA: Diagnosis not present

## 2021-10-13 DIAGNOSIS — Z9049 Acquired absence of other specified parts of digestive tract: Secondary | ICD-10-CM | POA: Diagnosis not present

## 2021-10-13 DIAGNOSIS — Z9841 Cataract extraction status, right eye: Secondary | ICD-10-CM | POA: Diagnosis not present

## 2021-10-13 DIAGNOSIS — I442 Atrioventricular block, complete: Secondary | ICD-10-CM | POA: Diagnosis present

## 2021-10-13 DIAGNOSIS — I4821 Permanent atrial fibrillation: Secondary | ICD-10-CM | POA: Diagnosis present

## 2021-10-13 DIAGNOSIS — Z961 Presence of intraocular lens: Secondary | ICD-10-CM | POA: Diagnosis present

## 2021-10-13 DIAGNOSIS — J9811 Atelectasis: Secondary | ICD-10-CM | POA: Diagnosis present

## 2021-10-13 DIAGNOSIS — Z885 Allergy status to narcotic agent status: Secondary | ICD-10-CM | POA: Diagnosis not present

## 2021-10-13 DIAGNOSIS — Z85820 Personal history of malignant melanoma of skin: Secondary | ICD-10-CM | POA: Diagnosis not present

## 2021-10-13 DIAGNOSIS — I495 Sick sinus syndrome: Secondary | ICD-10-CM | POA: Diagnosis present

## 2021-10-13 DIAGNOSIS — K219 Gastro-esophageal reflux disease without esophagitis: Secondary | ICD-10-CM | POA: Diagnosis present

## 2021-10-13 DIAGNOSIS — G450 Vertebro-basilar artery syndrome: Secondary | ICD-10-CM | POA: Diagnosis present

## 2021-10-13 DIAGNOSIS — I110 Hypertensive heart disease with heart failure: Secondary | ICD-10-CM | POA: Diagnosis present

## 2021-10-13 DIAGNOSIS — Z950 Presence of cardiac pacemaker: Secondary | ICD-10-CM | POA: Diagnosis not present

## 2021-10-13 DIAGNOSIS — R0600 Dyspnea, unspecified: Secondary | ICD-10-CM | POA: Diagnosis not present

## 2021-10-13 DIAGNOSIS — H8110 Benign paroxysmal vertigo, unspecified ear: Secondary | ICD-10-CM | POA: Diagnosis present

## 2021-10-13 DIAGNOSIS — Z86010 Personal history of colonic polyps: Secondary | ICD-10-CM | POA: Diagnosis not present

## 2021-10-13 DIAGNOSIS — Z79899 Other long term (current) drug therapy: Secondary | ICD-10-CM | POA: Diagnosis not present

## 2021-10-13 DIAGNOSIS — Z9842 Cataract extraction status, left eye: Secondary | ICD-10-CM | POA: Diagnosis not present

## 2021-10-13 DIAGNOSIS — Z87891 Personal history of nicotine dependence: Secondary | ICD-10-CM | POA: Diagnosis not present

## 2021-10-13 DIAGNOSIS — I714 Abdominal aortic aneurysm, without rupture, unspecified: Secondary | ICD-10-CM | POA: Diagnosis present

## 2021-10-13 DIAGNOSIS — I5032 Chronic diastolic (congestive) heart failure: Secondary | ICD-10-CM | POA: Diagnosis present

## 2021-10-13 DIAGNOSIS — Z66 Do not resuscitate: Secondary | ICD-10-CM | POA: Diagnosis present

## 2021-10-13 LAB — MAGNESIUM: Magnesium: 2.1 mg/dL (ref 1.7–2.4)

## 2021-10-13 LAB — ECHOCARDIOGRAM COMPLETE
AR max vel: 1.01 cm2
AV Area VTI: 0.92 cm2
AV Area mean vel: 1.06 cm2
AV Mean grad: 20 mmHg
AV Peak grad: 41.7 mmHg
AV Vena cont: 0.4 cm
Ao pk vel: 3.23 m/s
Area-P 1/2: 3.1 cm2
Height: 63 in
MV M vel: 4.81 m/s
MV Peak grad: 92.5 mmHg
MV VTI: 1.06 cm2
P 1/2 time: 534 msec
Radius: 0.8 cm
S' Lateral: 2.8 cm
Weight: 1952 oz

## 2021-10-13 LAB — CBC
HCT: 43.3 % (ref 36.0–46.0)
Hemoglobin: 14.2 g/dL (ref 12.0–15.0)
MCH: 30.7 pg (ref 26.0–34.0)
MCHC: 32.8 g/dL (ref 30.0–36.0)
MCV: 93.5 fL (ref 80.0–100.0)
Platelets: 151 10*3/uL (ref 150–400)
RBC: 4.63 MIL/uL (ref 3.87–5.11)
RDW: 14.2 % (ref 11.5–15.5)
WBC: 7.8 10*3/uL (ref 4.0–10.5)
nRBC: 0 % (ref 0.0–0.2)

## 2021-10-13 LAB — HEPARIN LEVEL (UNFRACTIONATED)
Heparin Unfractionated: <0.1 IU/mL — ABNORMAL LOW (ref 0.30–0.70)
Heparin Unfractionated: 0.31 IU/mL (ref 0.30–0.70)

## 2021-10-13 LAB — TROPONIN I (HIGH SENSITIVITY)
Troponin I (High Sensitivity): 18 ng/L — ABNORMAL HIGH (ref <18)
Troponin I (High Sensitivity): 17 ng/L (ref <18)

## 2021-10-13 MED ORDER — DABIGATRAN ETEXILATE MESYLATE 75 MG PO CAPS
75.0000 mg | ORAL_CAPSULE | Freq: Two times a day (BID) | ORAL | Status: DC
  Administered 2021-10-13 – 2021-10-14 (×3): 75 mg via ORAL
  Filled 2021-10-13 (×4): qty 1

## 2021-10-13 MED ORDER — CALCIUM CARBONATE ANTACID 500 MG PO CHEW
1.0000 | CHEWABLE_TABLET | Freq: Three times a day (TID) | ORAL | Status: DC
  Administered 2021-10-13 – 2021-10-14 (×2): 200 mg via ORAL
  Filled 2021-10-13 (×2): qty 1

## 2021-10-13 MED ORDER — ONDANSETRON HCL 4 MG/2ML IJ SOLN
4.0000 mg | Freq: Once | INTRAMUSCULAR | Status: AC | PRN
  Administered 2021-10-13: 4 mg via INTRAVENOUS
  Filled 2021-10-13: qty 2

## 2021-10-13 MED ORDER — CALCIUM CARBONATE ANTACID 500 MG PO CHEW: 1.0000 | CHEWABLE_TABLET | Freq: Three times a day (TID) | ORAL | Status: DC

## 2021-10-13 MED ORDER — ALUM & MAG HYDROXIDE-SIMETH 200-200-20 MG/5ML PO SUSP
15.0000 mL | Freq: Once | ORAL | Status: AC
Start: 2021-10-13 — End: 2021-10-13
  Administered 2021-10-13: 15 mL via ORAL
  Filled 2021-10-13: qty 30

## 2021-10-13 MED ORDER — NITROGLYCERIN 0.4 MG SL SUBL: 0.4000 mg | SUBLINGUAL_TABLET | SUBLINGUAL | Status: DC | PRN

## 2021-10-13 NOTE — Progress Notes (Signed)
On call provider Dr. Myna Hidalgo paged to inform about pts. respiratory status. Doctor ordered xray.

## 2021-10-13 NOTE — Progress Notes (Signed)
ANTICOAGULATION CONSULT NOTE- follow-up Pharmacy Consult for Heparin Indication: atrial fibrillation Brief A/P: Heparin level therapeutic. Continue at same rate and re-check heparin level in 6 hours  Allergies  Allergen Reactions   Tramadol Other (See Comments)    Medication made her feel "off and talk to herself"   Protonix [Pantoprazole Sodium] Other (See Comments)    Abdominal pain   Pantoprazole Other (See Comments)    Other reaction(s): Other (See Comments) Abdominal pain     Patient Measurements: Height: '5\' 3"'$  (160 cm) Weight: 55.3 kg (122 lb) IBW/kg (Calculated) : 52.4 Heparin Dosing Weight: 55 kg  Vital Signs: Temp: 98.9 F (37.2 C) (06/03 0753) Temp Source: Oral (06/03 0753) BP: 157/75 (06/03 0754) Pulse Rate: 70 (06/03 0754)  Labs: Recent Labs    10/12/21 0615 10/13/21 0055 10/13/21 0536 10/13/21 0743 10/13/21 1004  HGB 14.7 14.2  --   --   --   HCT 46.1* 43.3  --   --   --   PLT 159 151  --   --   --   HEPARINUNFRC  --  <0.10*  --   --  0.31  CREATININE 0.87  --   --   --   --   TROPONINIHS  --   --  18* 17  --      Estimated Creatinine Clearance: 33.4 mL/min (by C-G formula based on SCr of 0.87 mg/dL).  Assessment: 88 y.o. female admitted with vertigo and possible vertebral artery thrombus, h/o Afib and Pradaxa on hold, for heparin  Goal of Therapy:  Heparin level 0.3-0.5 units/ml Monitor platelets by anticoagulation protocol: Yes   Plan:  Continue Heparin 850 units/hr Check heparin level at 1600 (confirmatory level) Monitor CBC, Heparin level daily  Lavontay Kirk BS, PharmD, BCPS Clinical Pharmacist 10/13/2021 11:29 AM  Contact: [phone] after 3 PM  "Be curious, not judgmental..." -Jamal Maes

## 2021-10-13 NOTE — Progress Notes (Signed)
Patient complained of acute-onset SOB. CXR was repeated and bibasilar atelectasis noted. She now reports some chest discomfort that she rates 5/10 in severity and describes as her typical angina. Plan to repeat EKG and check troponins.

## 2021-10-13 NOTE — Progress Notes (Signed)
Echocardiogram 2D Echocardiogram has been performed.  Oneal Deputy Shalae Belmonte RDCS 10/13/2021, 2:54 PM

## 2021-10-13 NOTE — Progress Notes (Signed)
Xray completed, pt is resting with 2L/[place]

## 2021-10-13 NOTE — Significant Event (Signed)
Rapid Response Event Note   Reason for Call :  SOB/CP  Initial Focused Assessment:  Pt lying in bed with eyes open, tachypneic and nauseous. She is dry heaving. She is alert and oriented c/o SOB/N. She says that she did have discomfort in her chest before I arrived but this is better-she points to her epigastric region when I ask her to show me where her pain was. She says she has these "episodes" at home and she puts an ice pack on her throat and everything gets better. Lung are diminished t/o. Pt has a congested cough. Skin is hot to touch, dry.  T-99.8(R), HR-72, BP-179/87, RR-36, SpO2-98% on 2L [place].    Interventions:  EKG-Vpaced(done PTA RRT) PCXR-bibasilar atelectasis(done PTA RRT) Maalox, zofran, tylenol Plan of Care:  Give above meds and monitor results. Await troponin results. Continue to monitor pt closely. Call RRT if further asisstance needed.   Event Summary:   MD Notified: Dr. Myna Hidalgo notified PTA RRT Call [place] [id number]  Dillard Essex, RN

## 2021-10-13 NOTE — Hospital Course (Addendum)
[name] is a 88 y.o. female with medical history significant of hypertension, hyperlipidemia, atrial fibrillation on chronic anticoagulation, diastolic CHF, CHB status post PPM, COPD, AAA, melanoma, and remote history of tobacco abuse presented to the hospital with dizziness and near passing out spells, dizziness was more positional.  In the ED, patient was afebrile and blood pressure was elevated.  Laboratory data were unremarkable.  Orthostatic vital signs were negative.  Neurology was consulted.  CT scan of the brain did not show any acute abnormality.  MRI could not be done due to incompatibility with pacemaker.  Neurology then recommended a CTA of the head and neck and patient was admitted hospital for further evaluation and treatment.  Assessment and Plan:  Principal Problem:   Dizziness Active Problems:   CVA (cerebral vascular accident) ([place])   Longstanding persistent atrial fibrillation (HCC)   Chronic anticoagulation   Pacemaker   HTN (hypertension)   Hypercholesterolemia   GERD  Dizziness likely secondary to BPPV. Patient states that she has vertigo at baseline which recently got worse.  Neurology was consulted.  CT head was unremarkable.  Unable to do MRI due to pacemaker.  CTA of the head and neck showed near occlusion of the V4 with extension into the basilar artery.  As per neurology, possible subocclusive thrombus in the basilar artery with possible vertebrobasilar insufficiency.  TSH of 1.7.  Lipid profile with HDL of 36.  LDL 49.  Hemoglobin A1c was 5.7.  Physical therapy has seen the patient and recommend home health PT. Repeat CT head scan was negative for stroke.  Neurology has recommended outpatient follow-up and therapy on discharge.  Acute shortness of breath/chest discomfort , Resolved.  Thought to be secondary to atelectasis.  Has been weaned off oxygen at this time.  EKG was unremarkable.  Chest x-ray shows bibasilar atelectasis.     Permanent atrial fibrillation  on chronic anticoagulation On Pradaxa as outpatient and was reported to have compliance with it.  During hospitalization patient was initially on heparin drip was discontinued and patient has been resumed on Pradaxa from home.  Rate controlled at this time.   Complete heart block s/p PPM -Follow-up interrogation of pacemaker   Essential hypertension Patient is on furosemide 40 mg daily, isosorbide 30 mg daily, metoprolol succinate 50 mg twice daily as outpatient.  Will be resumed on discharge.  Hyperlipidemia Lipid panel noted.  Continue Crestor   GERD On Pepcid as outpatient.

## 2021-10-13 NOTE — Evaluation (Signed)
Occupational Therapy Evaluation Patient Details Name: [name] MRN: [id number] DOB: 17-Jun-1925 Today's Date: 10/13/2021   History of Present Illness Pt is a 88 y/o female admitted secondary to increased dizziness. CT negative and pt unable to get MRI secondary to pacemaker. PMH includes HTN, CVA, a fib, CHF, s/p pacemaker, and COPD.   Clinical Impression   Pt reports independence at baseline with ADLs, uses RW for functional mobility and has an aide that comes 8 hrs/day to assist with IADLs and bathing PRN. Pt currently mod I -min guard for ADLs, supervision for bed mobility, and min guard for transfers with RW. Pt denies dizziness this session. Pt does not wear O2 at baseline, SpO2 95-96% on RA at end of session, pt asymptomatic. RN notified. Pt presenting with impairments listed below, will follow acutely. Recommend HHOT at d/c,.     Recommendations for follow up therapy are one component of a multi-disciplinary discharge planning process, led by the attending physician.  Recommendations may be updated based on patient status, additional functional criteria and insurance authorization.   Follow Up Recommendations  Home health OT    Assistance Recommended at Discharge Intermittent Supervision/Assistance  Patient can return home with the following A little help with walking and/or transfers;A little help with bathing/dressing/bathroom;Assistance with cooking/housework;Assist for transportation;Help with stairs or ramp for entrance    Functional Status Assessment  Patient has had a recent decline in their functional status and demonstrates the ability to make significant improvements in function in a reasonable and predictable amount of time.  Equipment Recommendations  None recommended by OT;Other (comment) (pt has all needed DME)    Recommendations for Other Services       Precautions / Restrictions Precautions Precautions: Fall Restrictions Weight Bearing Restrictions: No       Mobility Bed Mobility Overal bed mobility: Needs Assistance Bed Mobility: Sit to Supine       Sit to supine: Supervision        Transfers Overall transfer level: Needs assistance Equipment used: Rolling walker (2 wheels) Transfers: Sit to/from Stand Sit to Stand: Min guard                  Balance Overall balance assessment: Needs assistance Sitting-balance support: No upper extremity supported Sitting balance-Leahy Scale: Good     Standing balance support: Single extremity supported Standing balance-Leahy Scale: Poor Standing balance comment: reliant on RW support                           ADL either performed or assessed with clinical judgement   ADL Overall ADL's : Needs assistance/impaired Eating/Feeding: Modified independent;Sitting   Grooming: Wash/dry hands;Standing;Supervision/safety Grooming Details (indicate cue type and reason): completed standing at sink Upper Body Bathing: Supervision/ safety;Sitting   Lower Body Bathing: Supervison/ safety;Sit to/from stand;Sitting/lateral leans   Upper Body Dressing : Supervision/safety;Sitting;Standing   Lower Body Dressing: Supervision/safety;Sitting/lateral leans;Sit to/from stand Lower Body Dressing Details (indicate cue type and reason): to doff pants/don undergarments Toilet Transfer: Min guard;Rolling walker (2 wheels);Ambulation;Regular Toilet   Toileting- Water quality scientist and Hygiene: Supervision/safety;Sit to/from stand;Sitting/lateral lean       Functional mobility during ADLs: Min guard;Rolling walker (2 wheels)       Vision Baseline Vision/History: 6 Macular Degeneration Ability to See in Adequate Light: 1 Impaired Patient Visual Report: No change from baseline;Other (comment) (has R eye blindness and macular degeneration in L eye at baseline) Vision Assessment?: Vision impaired- to be further tested in  functional context Additional Comments: vision impaired at baseline      Perception     Praxis      Pertinent Vitals/Pain Pain Assessment Pain Assessment: No/denies pain     Hand Dominance Right   Extremity/Trunk Assessment Upper Extremity Assessment Upper Extremity Assessment: Generalized weakness   Lower Extremity Assessment Lower Extremity Assessment: Defer to PT evaluation   Cervical / Trunk Assessment Cervical / Trunk Assessment: Kyphotic   Communication Communication Communication: HOH   Cognition Arousal/Alertness: Awake/alert Behavior During Therapy: WFL for tasks assessed/performed Overall Cognitive Status: Within Functional Limits for tasks assessed                                       General Comments  SpO2, 95-96% on RA after session; family member present in room    Exercises     Shoulder Instructions      Home Living Family/patient expects to be discharged to:: Private residence Living Arrangements: Alone Available Help at Discharge: Personal care attendant;Family;Available 24 hours/day (from 8-4) Type of Home: House Home Access: Ramped entrance     Home Layout: Two level Alternate Level Stairs-Number of Steps: chair lift down to basement   Bathroom Shower/Tub: Tub/shower unit   Bathroom Toilet: Handicapped height Bathroom Accessibility: Yes   Home Equipment: Conservation officer, nature (2 wheels);Tub bench;Rollator (4 wheels);BSC/3in1   Additional Comments: does not wear O2 at baseline      Prior Functioning/Environment Prior Level of Function : Needs assist             Mobility Comments: Uses RW for mobility ADLs Comments: Aide assists with IADLs and sometimes assists with bathing; reports occasionally bathing at sink        OT Problem List: Decreased strength;Decreased range of motion;Decreased activity tolerance;Impaired balance (sitting and/or standing)      OT Treatment/Interventions: Self-care/ADL training;Therapeutic exercise;DME and/or AE instruction;Therapeutic activities;Balance  training;Patient/family education;Energy conservation    OT Goals(Current goals can be found in the care plan section) Acute Rehab OT Goals Patient Stated Goal: none stated OT Goal Formulation: With patient Time For Goal Achievement: 10/27/21 Potential to Achieve Goals: Good ADL Goals Pt Will Perform Upper Body Dressing: Independently;sitting;standing Pt Will Perform Lower Body Dressing: Independently;sitting/lateral leans;sit to/from stand Pt Will Transfer to Toilet: Independently;ambulating;regular height toilet Pt Will Perform Tub/Shower Transfer: Tub transfer;Shower transfer;ambulating;tub bench;rolling walker;with supervision  OT Frequency: Min 2X/week    Co-evaluation              AM-PAC OT "6 Clicks" Daily Activity     Outcome Measure Help from another person eating meals?: None Help from another person taking care of personal grooming?: None Help from another person toileting, which includes using toliet, bedpan, or urinal?: None Help from another person bathing (including washing, rinsing, drying)?: A Little Help from another person to put on and taking off regular upper body clothing?: None Help from another person to put on and taking off regular lower body clothing?: A Little 6 Click Score: 22   End of Session Equipment Utilized During Treatment: Gait belt;Rolling walker (2 wheels) Nurse Communication: Mobility status;Other (comment) (notified O2 left off as pt's stats 95-96% and does not wear O2 at baseline)  Activity Tolerance: Patient tolerated treatment well Patient left: in bed;with call bell/phone within reach;with family/visitor present;with bed alarm set (sitting EOB eating lunch)  OT Visit Diagnosis: Unsteadiness on feet (R26.81);Other abnormalities of gait and mobility (R26.89);Muscle weakness (  generalized) (M62.81)                Time: 6226-3335 OT Time Calculation (min): 32 min Charges:  OT General Charges $OT Visit: 1 Visit OT Evaluation $OT Eval  Moderate Complexity: 1 Mod OT Treatments $Self Care/Home Management : 8-22 mins  Lynnda Child, OTD, OTR/L Acute Rehab [phone]) [phone] [place] 10/13/2021, 12:35 PM

## 2021-10-13 NOTE — Progress Notes (Addendum)
PROGRESS NOTE    [name]  ZOX:[id number] DOB: 1926/04/23 DOA: 10/12/2021 PCP: Merrilee Seashore, MD    Brief Narrative:  [name] is a 88 y.o. female with medical history significant of hypertension, hyperlipidemia, atrial fibrillation on chronic anticoagulation, diastolic CHF, CHB status post PPM, COPD, AAA, melanoma, and remote history of tobacco abuse presented to hospital with dizziness and near passing out spell dizziness was more positional.  Of note patient recently had a new battery placed in her pacemaker on 04/13/2021.  In the ED, patient was afebrile blood pressure was elevated.  Laboratory data were unremarkable.  Orthostatic vital signs were negative.  Neurology was consulted.  CT scan of the brain did not show any acute abnormality.  MRI could not be done due to incompatibility with pacemaker.  Neurology then recommended a CTA of the head and neck and patient was admitted hospital for further evaluation and treatment.  Assessment and Plan:  Principal Problem:   Dizziness Active Problems:   CVA (cerebral vascular accident) ([place])   Longstanding persistent atrial fibrillation ([place])   Chronic anticoagulation   Pacemaker   HTN (hypertension)   Hypercholesterolemia   GERD  Dizziness secondary to suspected CVA Patient states that she has vertigo at baseline which recently got worse.  Neurology on board.  CT head was unremarkable.  Unable to do MRI due to pacemaker.  CTA of the head and neck showed near occlusion of the V4 with extension into the basilar artery.  As per neurology possible subocclusive thrombus in the basilar artery with possible vertebrobasilar insufficiency.  TSH of 1.7.  Lipid profile with HDL of 36.  LDL 49.  Hemoglobin A1c was 5.7.  Physical therapy has seen the patient and recommend home health PT.  Follow neurology recommendations.  Follow-up closely.  Repeat CT head for today pending at this time  Acute shortness of breath/chest discomfort overnight.   Denies current chest discomfort.  Chest x-ray showed bibasilar atelectasis.  EKG showed to be a paced rhythm.  Patient was given Maalox, Zofran and Tylenol.  Troponin was 18 within normal range.  Continue incentive spirometry.  Aspiration precautions.  Patient was encouraged deep breathing.  Supplemental oxygen at this time.  We will try to wean oxygen.   Permanent atrial fibrillation on chronic anticoagulation On Pradaxa as outpatient and was reported to have compliance with it.  Currently on heparin drip.  Rate controlled at this time.   Complete heart block s/p PPM -Follow-up interrogation of pacemaker   Essential hypertension Patient is on furosemide 40 mg daily, isosorbide 30 mg daily, metoprolol succinate 50 mg twice daily as outpatient.  Currently allowing for permissive hypertension latest blood pressure of 179/87.   Hyperlipidemia Lipid panel noted.  Continue Crestor   GERD Continue PPI.     DVT prophylaxis:   Heparin drip   Code Status:     Code Status: DNR  Disposition: Home.  Status is: Observation  The patient will require care spanning > 2 midnights and should be moved to inpatient because: New respiratory issues with oxygen requirement, on heparin drip, stroke work-up   Family Communication: Spoke with the patient's son Mr. Merry Proud on the phone and updated him about the clinical condition of the patient.  Consultants:  Neurology  Procedures:  None  Antimicrobials:  None  Anti-infectives (From admission, onward)    None      Subjective: Today, patient was seen and examined at bedside.  Patient states that she does not have worsening dizziness today  but has chronic vertigo.  Had the shortness of breath overnight.  Objective: Vitals:   10/13/21 0717 10/13/21 0722 10/13/21 0753 10/13/21 0754  BP: (!) 153/96 (!) 153/96 (!) 167/79 (!) 157/75  Pulse: 70 70 70 70  Resp:  (!) 30 14   Temp:  98 F (36.7 C) 98.9 F (37.2 C)   TempSrc:  Oral Oral   SpO2:   98% 98%   Weight:      Height:        Intake/Output Summary (Last 24 hours) at 10/13/2021 1031 Last data filed at 10/13/2021 0900 Gross per 24 hour  Intake 240 ml  Output 2 ml  Net 238 ml   Filed Weights   10/12/21 0604  Weight: 55.3 kg    Physical Examination: Body mass index is 21.61 kg/m.   General: Thinly built, not in obvious distress, on nasal cannula oxygen HENT:   No scleral pallor or icterus noted. Oral mucosa is moist.  Chest:    Diminished breath sounds bilaterally. No crackles or wheezes.  Pacemaker in place. CVS: S1 &S2 heard. No murmur.  Irregularly irregular rhythm. Abdomen: Soft, nontender, nondistended.  Bowel sounds are heard.   Extremities: No cyanosis, clubbing or edema.  Peripheral pulses are palpable. Psych: Alert, awake and oriented, normal mood CNS:  No cranial nerve deficits.  Power equal in all extremities.   Skin: Warm and dry.  No rashes noted.  Data Reviewed:   CBC: Recent Labs  Lab 10/12/21 0615 10/13/21 0055  WBC 7.2 7.8  HGB 14.7 14.2  HCT 46.1* 43.3  MCV 95.6 93.5  PLT 159 201    Basic Metabolic Panel: Recent Labs  Lab 10/12/21 0615 10/13/21 0055  NA 139  --   K 4.5  --   CL 104  --   CO2 25  --   GLUCOSE 113*  --   BUN 20  --   CREATININE 0.87  --   CALCIUM 9.1  --   MG  --  2.1    Liver Function Tests: Recent Labs  Lab 10/12/21 1210  AST 30  ALT 18  ALKPHOS 104  BILITOT 1.6*  PROT 5.9*  ALBUMIN 3.3*     Radiology Studies: CT ANGIO HEAD NECK W WO CM  Result Date: 10/12/2021 CLINICAL DATA:  Dizziness, assess intracranial arteries EXAM: CT ANGIOGRAPHY HEAD AND NECK TECHNIQUE: Multidetector CT imaging of the head and neck was performed using the standard protocol during bolus administration of intravenous contrast. Multiplanar CT image reconstructions and MIPs were obtained to evaluate the vascular anatomy. Carotid stenosis measurements (when applicable) are obtained utilizing NASCET criteria, using the distal  internal carotid diameter as the denominator. RADIATION DOSE REDUCTION: This exam was performed according to the departmental dose-optimization program which includes automated exposure control, adjustment of the mA and/or kV according to patient size and/or use of iterative reconstruction technique. CONTRAST:  65m OMNIPAQUE IOHEXOL 350 MG/ML SOLN COMPARISON:  Same-day noncontrast head CT, CTA neck 11/30/2015 FINDINGS: CTA NECK FINDINGS Aortic arch: There is calcified atherosclerotic plaque in the imaged aortic arch. The origins of the major branch vessels are patent. Subclavian arteries are patent to the level imaged with scattered calcified plaque. Right carotid system: Right common carotid artery is patent. There is calcified plaque at the bifurcation resulting in moderate to severe stenosis at the origin of the right external carotid artery. There is no hemodynamically significant stenosis of the right internal carotid artery. The right internal carotid artery is tortuous in the  neck. There is no evidence of dissection or aneurysm. Left carotid system: The left common carotid artery is patent. There is calcified plaque at the bifurcation resulting in up to approximately 50-60% stenosis at the origin of the left internal carotid artery. The distal left internal carotid artery is patent with tortuosity laterally. The left external carotid artery is patent. There is no evidence of dissection or aneurysm. Vertebral arteries: There is focal moderate stenosis in the proximal right V1 segment (7-243. Remainder of the right vertebral artery is patent throughout the neck. The left vertebral artery is patent throughout the neck. There is no evidence of dissection or aneurysm. Skeleton: There is mild multilevel degenerative change of the cervical spine. There is no acute osseous abnormality or suspicious osseous lesion. Other neck: The soft tissues of the neck are unremarkable. There is a small (4 mm hyperdense nodule in  the right aspect of the right globe (7-120). Upper chest: The imaged lung apices are clear. Review of the MIP images confirms the above findings CTA HEAD FINDINGS Anterior circulation: There is calcified atherosclerotic plaque in the bilateral intracranial ICAs resulting in mild-to-moderate stenosis of the bilateral supraclinoid segments. The bilateral MCAs are patent without proximal high-grade stenosis or occlusion. The bilateral ACAs are patent, without proximal high-grade stenosis or occlusion. The anterior communicating artery is normal. There is no aneurysm or AVM. Posterior circulation: There is severe stenosis with diminished enhancement of the distal right V4 segment prior to the vertebrobasilar junction, new/progressed since 2017 suspicious for mural adherent clot. Clot appears to extend into the basilar artery resulting in at least moderate stenosis (8-108). The distal basilar artery is patent. PICA is not identified on the right. There is calcified plaque throughout the left V4 segment resulting in multifocal mild-to-moderate stenosis. PICA is identified on the left. The bilateral PCAs are patent. The posterior communicating arteries are not identified. Venous sinuses: Suboptimally evaluated due to bolus timing. Anatomic variants: None. Review of the MIP images confirms the above findings IMPRESSION: 1. Diminished enhancement of the right V4 segment prior to the vertebrobasilar junction suspicious for mural adherent clot resulting in severe stenosis. Clot extends into the basilar artery resulting in at least moderate stenosis. The distal basilar artery is patent. Recommend NIR consultation. 2. Otherwise, patent intracranial vasculature with calcified atherosclerotic plaque in the intracranial ICAs resulting in mild-to-moderate stenosis bilaterally. 3. Calcified atherosclerotic plaque of the bilateral carotid bifurcations resulting in approximately 50-60% stenosis on the left and no hemodynamically  significant stenosis on the right. 4. Small hyperdense nodule in the right aspect of the right globe of uncertain etiology. Consider correlation with fundoscopic exam. These results were called by telephone at the time of interpretation on 10/12/2021 at 12:34 pm to provider Beulah Gandy, NP , who verbally acknowledged these results. Electronically Signed   By: Valetta Mole M.D.   On: 10/12/2021 12:34   CT HEAD WO CONTRAST (5MM)  Result Date: 10/12/2021 CLINICAL DATA:  Stroke, follow-up; possible stroke versus artifact in the cerebellum EXAM: CT HEAD WITHOUT CONTRAST TECHNIQUE: Contiguous axial images were obtained from the base of the skull through the vertex without intravenous contrast. RADIATION DOSE REDUCTION: This exam was performed according to the departmental dose-optimization program which includes automated exposure control, adjustment of the mA and/or kV according to patient size and/or use of iterative reconstruction technique. COMPARISON:  Earlier same day FINDINGS: Brain: No acute intracranial hemorrhage or mass effect. No new loss of gray-white differentiation. A cerebellar infarct is not identified. A rounded area  in the left ventral pons in the axial plane appears reflect artifact and other planes. Ventricles and sulci are stable in size and configuration. Stable findings of chronic microvascular ischemic changes. No acute intracranial hemorrhage identified. Vascular: Residual contrast from recent CTA. Atherosclerotic calcification at the skull base. Skull: Unremarkable. Sinuses/Orbits: No new finding. IMPRESSION: No acute intracranial hemorrhage or new loss of gray-white differentiation. A cerebellar infarct is not identified Electronically Signed   By: Macy Mis M.D.   On: 10/12/2021 13:41   CT Head Wo Contrast  Result Date: 10/12/2021 CLINICAL DATA:  Dizziness. EXAM: CT HEAD WITHOUT CONTRAST TECHNIQUE: Contiguous axial images were obtained from the base of the skull through the vertex  without intravenous contrast. RADIATION DOSE REDUCTION: This exam was performed according to the departmental dose-optimization program which includes automated exposure control, adjustment of the mA and/or kV according to patient size and/or use of iterative reconstruction technique. COMPARISON:  Sep 14, 2014. FINDINGS: Brain: No evidence of acute infarction, hemorrhage, hydrocephalus, extra-axial collection or mass lesion/mass effect. Vascular: No hyperdense vessel or unexpected calcification. Skull: Normal. Negative for fracture or focal lesion. Sinuses/Orbits: No acute finding. Other: None. IMPRESSION: No acute intracranial abnormality seen. Electronically Signed   By: Marijo Conception M.D.   On: 10/12/2021 07:46   DG CHEST PORT 1 VIEW  Result Date: 10/13/2021 CLINICAL DATA:  Acute dyspnea. EXAM: PORTABLE CHEST 1 VIEW COMPARISON:  Radiograph yesterday. FINDINGS: Left-sided pacemaker remains in place. Cardiomegaly is unchanged. Aortic atherosclerosis. Bibasilar atelectasis, right greater than left. No pulmonary edema. Minimal blunting of costophrenic angles without significant effusion. No pneumothorax. IMPRESSION: 1. Unchanged cardiomegaly. 2. Bibasilar atelectasis, right greater than left. Electronically Signed   By: Keith Rake M.D.   On: 10/13/2021 01:58   DG Chest Portable 1 View  Result Date: 10/12/2021 CLINICAL DATA:  88 year old female with history of dyspnea, cough and congestion. EXAM: PORTABLE CHEST 1 VIEW COMPARISON:  Chest x-ray 02/04/2021. FINDINGS: Lung volumes are normal. No consolidative airspace disease. No pleural effusions. No pneumothorax. No evidence of pulmonary edema. Heart size is mildly enlarged. Upper mediastinal contours are within normal limits allowing for patient positioning. Atherosclerotic calcifications in the thoracic aorta. Left-sided pacemaker device in place with lead tips projecting over the expected location of the right atrium and right ventricular apex.  IMPRESSION: 1. No radiographic evidence of acute cardiopulmonary disease. 2. Mild cardiomegaly. 3. Aortic atherosclerosis. Electronically Signed   By: Vinnie Langton M.D.   On: 10/12/2021 08:05      LOS: 0 days    Flora Lipps, MD [facility] Available via Epic secure chat 7am-7pm After these hours, please refer to coverage provider listed on [email or web address] 10/13/2021, 10:31 AM

## 2021-10-13 NOTE — Progress Notes (Addendum)
STROKE TEAM PROGRESS NOTE   INTERVAL HISTORY No family at the bedside, passed bedside swallow- resume pradaxa and d/c heparin. Repeat CT shows no acute abnormalities of posterior circulation stroke..  Patient states she has had similar episodes of dizziness in the past which transient and related to sudden head position change.  She has never been formally diagnosed with BPPV  Vitals:   10/13/21 0722 10/13/21 0753 10/13/21 0754 10/13/21 1135  BP: (!) 153/96 (!) 167/79 (!) 157/75 (!) 152/82  Pulse: 70 70 70 70  Resp: (!) '30 14  20  '$ Temp: 98 F (36.7 C) 98.9 F (37.2 C)  97.7 F (36.5 C)  TempSrc: Oral Oral  Oral  SpO2: 98% 98%  99%  Weight:      Height:       CBC:  Recent Labs  Lab 10/12/21 0615 10/13/21 0055  WBC 7.2 7.8  HGB 14.7 14.2  HCT 46.1* 43.3  MCV 95.6 93.5  PLT 159 650   Basic Metabolic Panel:  Recent Labs  Lab 10/12/21 0615 10/13/21 0055  NA 139  --   K 4.5  --   CL 104  --   CO2 25  --   GLUCOSE 113*  --   BUN 20  --   CREATININE 0.87  --   CALCIUM 9.1  --   MG  --  2.1   Lipid Panel:  Recent Labs  Lab 10/12/21 1210  CHOL 95  TRIG 48  HDL 36*  CHOLHDL 2.6  VLDL 10  LDLCALC 49   HgbA1c:  Recent Labs  Lab 10/12/21 1210  HGBA1C 5.7*   Urine Drug Screen: No results for input(s): LABOPIA, COCAINSCRNUR, LABBENZ, AMPHETMU, THCU, LABBARB in the last 168 hours.  Alcohol Level No results for input(s): ETH in the last 168 hours.  IMAGING past 24 hours CT HEAD WO CONTRAST (5MM)  Result Date: 10/12/2021 CLINICAL DATA:  Stroke, follow-up; possible stroke versus artifact in the cerebellum EXAM: CT HEAD WITHOUT CONTRAST TECHNIQUE: Contiguous axial images were obtained from the base of the skull through the vertex without intravenous contrast. RADIATION DOSE REDUCTION: This exam was performed according to the departmental dose-optimization program which includes automated exposure control, adjustment of the mA and/or kV according to patient size and/or  use of iterative reconstruction technique. COMPARISON:  Earlier same day FINDINGS: Brain: No acute intracranial hemorrhage or mass effect. No new loss of gray-white differentiation. A cerebellar infarct is not identified. A rounded area in the left ventral pons in the axial plane appears reflect artifact and other planes. Ventricles and sulci are stable in size and configuration. Stable findings of chronic microvascular ischemic changes. No acute intracranial hemorrhage identified. Vascular: Residual contrast from recent CTA. Atherosclerotic calcification at the skull base. Skull: Unremarkable. Sinuses/Orbits: No new finding. IMPRESSION: No acute intracranial hemorrhage or new loss of gray-white differentiation. A cerebellar infarct is not identified Electronically Signed   By: Macy Mis M.D.   On: 10/12/2021 13:41   DG CHEST PORT 1 VIEW  Result Date: 10/13/2021 CLINICAL DATA:  Acute dyspnea. EXAM: PORTABLE CHEST 1 VIEW COMPARISON:  Radiograph yesterday. FINDINGS: Left-sided pacemaker remains in place. Cardiomegaly is unchanged. Aortic atherosclerosis. Bibasilar atelectasis, right greater than left. No pulmonary edema. Minimal blunting of costophrenic angles without significant effusion. No pneumothorax. IMPRESSION: 1. Unchanged cardiomegaly. 2. Bibasilar atelectasis, right greater than left. Electronically Signed   By: Keith Rake M.D.   On: 10/13/2021 01:58    PHYSICAL EXAM GENERAL: Awake, alert in NAD LUNGS -  Clear to auscultation bilaterally with no wheezes CV - S1S2 RRR, no m/r/g, equal pulses bilaterally.  NEURO:  Mental Status: AA&Ox4 Language: speech is clear.  Naming, repetition, fluency, and comprehension intact. Cranial Nerves: PERRL 2 mm/brisk. EOMI, visual fields full, no facial asymmetry, facial sensation intact, hearing intact, tongue/uvula/soft palate midline, normal sternocleidomastoid and trapezius muscle strength.  No evidence of tongue atrophy or fibrillations Motor: 5/5  bilateral uppers, 4/5 bilateral lowers  Tone: is normal and bulk is normal Sensation- Intact to light touch bilaterally Coordination: FTN intact bilaterally, no ataxia in BLE.  Headshaking positive for subjective dizziness but no nystagmus seen Gait- deferred  ASSESSMENT/PLAN [name] is a 88 y.o. female with history of HTN, HLD, Chronic A fib on Pradaxa, CHF, tachy-brady syndrome s/p PPM, aortic dissection, COPD, GERD who presents to [facility] ED via EMS for evaluation of dizziness. Initally started on heparin drip, no back on pradaxa. Awaiting vestibular PT evaluation for vertigo/dizziness.  Dizziness likely peripheral vestibular origin from benign paroxysmal positional vertigo.  Doubt TIA Code Stroke CT head No acute abnormality.  Repeat CT head 10/13/2021 also negative for acute infarct CTA head & neck - Diminished enhancement of the right V4 segment prior to the vertebrobasilar junction. Calcified atherosclerotic plaque of the bilateral carotid bifurcations resulting in approximately 50-60% stenosis on the left and no hemodynamically significant stenosis on the right.  Repeat CT pending-  2D Echo pending LDL 49 HgbA1c 5.7 VTE prophylaxis     Diet   Diet Heart Room service appropriate? Yes; Fluid consistency: Thin   Pradaxa (dabigatran) twice a day prior to admission, now on Pradaxa (dabigatran) twice a day.  Therapy recommendations:  Vestibular therapy Disposition:    Atrial Fibrillation  Home med: Pradaxa '75mg'$  BID, metoprolol succinate '50mg'$   Hypertension Home meds:  None Stable Permissive hypertension (OK if < 220/120) but gradually normalize in 5-7 days Long-term BP goal normotensive  Hyperlipidemia Home meds:  crestor '5mg'$ , resumed in hospital LDL 49, goal < 70 Continue statin at discharge  Other Stroke Risk Factors Advanced Age >/= 71  Obesity, Body mass index is 21.61 kg/m., BMI >/= 30 associated with increased stroke risk, recommend weight loss, diet and exercise  as appropriate  Congestive heart failure Lasix, imdur  Other Active Problems Tachy-brady syndrome S/p PPM- not MRI compatible  Hospital day # 0  Patient seen and examined by NP/APP with MD. MD to update note as needed.   Natasha Ores, DNP, FNP-BC [facility] Pager: [phone]  I have personally obtained history,examined this patient, reviewed notes, independently viewed imaging studies, participated in medical decision making and plan of care.ROS completed by me personally and pertinent positives fully documented  I have made any additions or clarifications directly to the above note. Agree with note above.  Patient presented with sudden onset of subjective dizziness and imbalance related to head movement.  She denies true vertigo or any other accompanying focal neurological symptoms.  She states she has had similar episodes in the past related to head movements likely has benign paroxysmal positional vertigo or peripheral vestibular dysfunction.  Doubt posterior circulation TIA in the absence of any other associated focal neurological symptoms.  Recommend repeat CT head and follow echo results.  Discontinue IV heparin drip and resume Pradaxa for stroke prevention for atrial fibrillation.  Referral to physical therapy for vestibular rehab and dizziness exercises.  Discussed with Dr.Pokhrel.  Greater than 50% time during this 50-minute visit were spent in counseling and coordination of care about  her dizziness and benign positional vertigo versus TIA discussion and answering questions.  Stroke team will sign off.  Kindly call for questions.  Antony Contras, MD Medical Director [place] Pager: [phone] 10/13/2021 2:07 PM  To contact Stroke Continuity provider, please refer to [email or web address] After hours, contact General Neurology

## 2021-10-13 NOTE — Progress Notes (Signed)
0300 Pts c/o SOB, provider informed. Ordered EKG. Completed multiple times as results would not transfer but physical copies chart. Provider updated. Respiratory back in room to assess pt.   0430 pt verbalized was feeling terrible increased SOB, agitated restless. Provider updated.[id number] RRT nurse called to assess pt and update provider.  Marland Kitchen

## 2021-10-13 NOTE — Progress Notes (Signed)
Pt verbalized SOB, lungs clear, right lower base is diminished.congested cough. Respiratory assessed and recommended 2L of oxygen for SOB. Pt stated this happens to her sometimes and that she didn't take her lasix this morning prior to coming to hospital.

## 2021-10-13 NOTE — Progress Notes (Signed)
[place] for Heparin Indication: atrial fibrillation Brief A/P: Heparin level subtherapeutic Increase Heparin rate  Allergies  Allergen Reactions   Tramadol Other (See Comments)    Medication made her feel "off and talk to herself"   Protonix [Pantoprazole Sodium] Other (See Comments)    Abdominal pain   Pantoprazole Other (See Comments)    Other reaction(s): Other (See Comments) Abdominal pain     Patient Measurements: Height: '5\' 3"'$  (160 cm) Weight: 55.3 kg (122 lb) IBW/kg (Calculated) : 52.4 Heparin Dosing Weight: 55 kg  Vital Signs: Temp: 97.5 F (36.4 C) (06/02 1927) Temp Source: Oral (06/02 1927) BP: 163/82 (06/02 2325) Pulse Rate: 69 (06/02 2325)  Labs: Recent Labs    10/12/21 0615 10/13/21 0055  HGB 14.7 14.2  HCT 46.1* 43.3  PLT 159 151  HEPARINUNFRC  --  <0.10*  CREATININE 0.87  --      Estimated Creatinine Clearance: 33.4 mL/min (by C-G formula based on SCr of 0.87 mg/dL).  Assessment: 88 y.o. female admitted with vertigo and possible vertebral artery thrombus, h/o Afib and Pradaxa on hold, for heparin  Goal of Therapy:  Heparin level 0.3-0.5 units/ml Monitor platelets by anticoagulation protocol: Yes   Plan:  Increase Heparin 850 units/hr Check heparin level in 8 hours.   Phillis Knack, PharmD, BCPS  10/13/2021,1:41 AM

## 2021-10-14 DIAGNOSIS — Z7901 Long term (current) use of anticoagulants: Secondary | ICD-10-CM | POA: Diagnosis not present

## 2021-10-14 DIAGNOSIS — I6320 Cerebral infarction due to unspecified occlusion or stenosis of unspecified precerebral arteries: Secondary | ICD-10-CM | POA: Diagnosis not present

## 2021-10-14 DIAGNOSIS — K219 Gastro-esophageal reflux disease without esophagitis: Secondary | ICD-10-CM | POA: Diagnosis not present

## 2021-10-14 DIAGNOSIS — R42 Dizziness and giddiness: Secondary | ICD-10-CM | POA: Diagnosis not present

## 2021-10-14 LAB — CBC
HCT: 42.1 % (ref 36.0–46.0)
Hemoglobin: 13.8 g/dL (ref 12.0–15.0)
MCH: 30.8 pg (ref 26.0–34.0)
MCHC: 32.8 g/dL (ref 30.0–36.0)
MCV: 94 fL (ref 80.0–100.0)
Platelets: 137 10*3/uL — ABNORMAL LOW (ref 150–400)
RBC: 4.48 MIL/uL (ref 3.87–5.11)
RDW: 14 % (ref 11.5–15.5)
WBC: 9.9 10*3/uL (ref 4.0–10.5)
nRBC: 0 % (ref 0.0–0.2)

## 2021-10-14 MED ORDER — MECLIZINE HCL 12.5 MG PO TABS: 12.5000 mg | ORAL_TABLET | Freq: Three times a day (TID) | ORAL | 0 refills | Status: DC | PRN

## 2021-10-14 NOTE — TOC Transition Note (Signed)
Transition of Care [facility]) - CM/SW Discharge Note   Patient Details  Name: [name] MRN: [id number] Date of Birth: 1925/10/10  Transition of Care [facility]) CM/SW Contact:  Carles Collet, RN Phone Number: 10/14/2021, 9:53 AM   Clinical Narrative:    Damaris Schooner w patient at bedside. She is agreeable to [facility] services as recommended by therapies. She would like to use [place], and referral accepted. Patient states that she has HHA several hours a day at home, and has already contacted her ride.  No other TOC needs identified for DC    Final next level of care: Home w Home Health Services Barriers to Discharge: No Barriers Identified   Patient Goals and CMS Choice Patient states their goals for this hospitalization and ongoing recovery are:: to go home CMS Medicare.gov Compare Post Acute Care list provided to:: Patient Choice offered to / list presented to : Patient  Discharge Placement                       Discharge Plan and Services                          HH Arranged: PT, OT [facility] Agency: [place] Date [facility] Agency Contacted: 10/14/21 Time [place]: [phone] Representative spoke with at [place]: [place] ([place]) Interventions     Readmission Risk Interventions    02/10/2021   10:47 AM  Readmission Risk Prevention Plan  Transportation Screening Complete  PCP or Specialist Appt within 3-5 Days Complete  HRI or [place] Complete  Social Work Consult for [place] Planning/Counseling Complete  Palliative Care Screening Not Applicable  Medication Review Press photographer) Complete

## 2021-10-14 NOTE — Discharge Summary (Signed)
Physician Discharge Summary   Patient: [name] MRN: [id number] DOB: 1925-06-18  Admit date:     10/12/2021  Discharge date: 10/14/21  Discharge Physician: Flora Lipps   PCP: Merrilee Seashore, MD   Recommendations at discharge:   Follow-up with your primary care physician in 1 week.   Check  CBC, BMP, magnesium in the next visit. Patient has been prescribed meclizine for peripheral dizziness.  Might benefit from ENT evaluation as outpatient.  Physical therapy has been requested with focus on vestibular PT  Discharge Diagnoses: Principal Problem:   Dizziness Active Problems:   CVA (cerebral vascular accident) ([place])   Longstanding persistent atrial fibrillation ([place])   Chronic anticoagulation   Pacemaker   HTN (hypertension)   Hypercholesterolemia   GERD  Resolved Problems:   * No resolved hospital problems. *  Hospital Course: [name] is a 88 y.o. female with medical history significant of hypertension, hyperlipidemia, atrial fibrillation on chronic anticoagulation, diastolic CHF, CHB status post PPM, COPD, AAA, melanoma, and remote history of tobacco abuse presented to the hospital with dizziness and near passing out spells, dizziness was more positional.  In the ED, patient was afebrile and blood pressure was elevated.  Laboratory data were unremarkable.  Orthostatic vital signs were negative.  Neurology was consulted.  CT scan of the brain did not show any acute abnormality.  MRI could not be done due to incompatibility with pacemaker.  Neurology then recommended a CTA of the head and neck and patient was admitted hospital for further evaluation and treatment.  Assessment and Plan:  Principal Problem:   Dizziness Active Problems:   CVA (cerebral vascular accident) ([place])   Longstanding persistent atrial fibrillation ([facility])   Chronic anticoagulation   Pacemaker   HTN (hypertension)   Hypercholesterolemia   GERD  Dizziness likely secondary to BPPV. Patient  states that she has vertigo at baseline which recently got worse.  Neurology was consulted.  CT head was unremarkable.  Unable to do MRI due to pacemaker.  CTA of the head and neck showed near occlusion of the V4 with extension into the basilar artery.  As per neurology, possible subocclusive thrombus in the basilar artery with possible vertebrobasilar insufficiency.  TSH of 1.7.  Lipid profile with HDL of 36.  LDL 49.  Hemoglobin A1c was 5.7.  Physical therapy has seen the patient and recommend home health PT. Repeat CT head scan was negative for stroke.  Neurology has recommended outpatient follow-up and therapy on discharge.  Acute shortness of breath/chest discomfort , Resolved.  Thought to be secondary to atelectasis.  Has been weaned off oxygen at this time.  EKG was unremarkable.  Chest x-ray shows bibasilar atelectasis.     Permanent atrial fibrillation on chronic anticoagulation On Pradaxa as outpatient and was reported to have compliance with it.  During hospitalization patient was initially on heparin drip was discontinued and patient has been resumed on Pradaxa from home.  Rate controlled at this time.   Complete heart block s/p PPM -Follow-up interrogation of pacemaker   Essential hypertension Patient is on furosemide 40 mg daily, isosorbide 30 mg daily, metoprolol succinate 50 mg twice daily as outpatient.  Will be resumed on discharge.  Hyperlipidemia Lipid panel noted.  Continue Crestor   GERD On Pepcid as outpatient.  Consultants: Neurology  Procedures performed: None  Disposition: Home health.  Communicated with the patient's son on the phone on 10/13/2021  Diet recommendation:  Discharge Diet Orders (From admission, onward)     Start  Ordered   10/14/21 0000  Diet - low sodium heart healthy        10/14/21 0851           Cardiac diet DISCHARGE MEDICATION: Allergies as of 10/14/2021       Reactions   Tramadol Other (See Comments)   Medication made her feel  "off and talk to herself"   Protonix [pantoprazole Sodium] Other (See Comments)   Abdominal pain   Pantoprazole Other (See Comments)   Other reaction(s): Other (See Comments) Abdominal pain        Medication List     TAKE these medications    acetaminophen 500 MG tablet Commonly known as: TYLENOL Take 1 tablet (500 mg total) by mouth every 6 (six) hours as needed for mild pain or fever. No more than total 4 tablet a day   CALCIUM + D PO Take 1 tablet by mouth 2 (two) times daily.   calcium carbonate 1500 (600 Ca) MG Tabs tablet Commonly known as: OSCAL Take 600 mg by mouth daily.   dabigatran 75 MG Caps capsule Commonly known as: Pradaxa Take 1 capsule (75 mg total) by mouth every 12 (twelve) hours.   famotidine 20 MG tablet Commonly known as: PEPCID Take 1 tablet (20 mg total) by mouth daily.   Fish Oil 1000 MG Caps Take 1,000 mg by mouth daily.   fluorometholone 0.1 % ophthalmic suspension Commonly known as: FML Place 1 drop into both eyes 2 (two) times daily.   fluticasone 50 MCG/ACT nasal spray Commonly known as: FLONASE Place 1 spray into both nostrils daily as needed for allergies or rhinitis.   furosemide 40 MG tablet Commonly known as: LASIX Take 1 tablet (40 mg total) by mouth daily.   isosorbide mononitrate 30 MG 24 hr tablet Commonly known as: IMDUR Take 1 tablet (30 mg total) by mouth daily.   ketoconazole 2 % cream Commonly known as: NIZORAL 1 application daily as needed for irritation.   magnesium oxide 400 (241.3 Mg) MG tablet Commonly known as: MAG-OX Take 1 tablet (400 mg total) by mouth daily.   meclizine 12.5 MG tablet Commonly known as: ANTIVERT Take 1 tablet (12.5 mg total) by mouth 3 (three) times daily as needed for dizziness.   metoprolol succinate 50 MG 24 hr tablet Commonly known as: TOPROL-XL TAKE 1 TABLET BY MOUTH TWICE DAILY. TAKE WITH OR IMMEDIATELY FOLLOWING A MEAL   multivitamin with minerals Tabs tablet Take 1  tablet by mouth daily.   nitroGLYCERIN 0.4 MG SL tablet Commonly known as: NITROSTAT Place 1 tablet (0.4 mg total) under the tongue every 5 (five) minutes as needed for chest pain. DO NOT TAKE MORE THAN 3 DOSES WITHIN 15 MINS   omeprazole 40 MG capsule Commonly known as: PRILOSEC Take 40 mg by mouth daily.   potassium chloride 10 MEQ tablet Commonly known as: KLOR-CON TAKE 1 TABLET(10 MEQ) BY MOUTH DAILY What changed: See the new instructions.   PreserVision AREDS 2 Caps Take 1 capsule by mouth 2 (two) times daily.   rosuvastatin 5 MG tablet Commonly known as: CRESTOR Take 5 mg by mouth daily.   SYSTANE OP Place 2 drops into both eyes 2 (two) times daily as needed (for dry eyes).        Follow-up Information     [facility], [facility] Follow up.   Specialty: [place] Why: for home health services Contact information: [address] [place] [id number] [phone]  subjective: Today, patient was seen and examined at bedside.  Denies ongoing vertigo, chest pain, dizziness, lightheadedness.  Discharge Exam: Filed Weights   10/12/21 0604  Weight: 55.3 kg      10/14/2021    7:51 AM 10/14/2021    3:35 AM 10/13/2021   11:30 PM  Vitals with BMI  Systolic [phone]  Diastolic 76 85 78  Pulse 70 70 69    General: Thinly built, not in obvious distress HENT:   No scleral pallor or icterus noted. Oral mucosa is moist.  Chest:    Diminished breath sounds bilaterally. No crackles or wheezes.  CVS: S1 &S2 heard. No murmur.  Irregular rhythm.  Pacemaker in place Abdomen: Soft, nontender, nondistended.  Bowel sounds are heard.   Extremities: No cyanosis, clubbing or edema.  Peripheral pulses are palpable. Psych: Alert, awake and oriented, normal mood CNS:  No cranial nerve deficits.  Power equal in all extremities.   Skin: Warm and dry.  No rashes noted.   Condition at discharge: good  The results of significant  diagnostics from this hospitalization (including imaging, microbiology, ancillary and laboratory) are listed below for reference.   Imaging Studies: CT ANGIO HEAD NECK W WO CM  Result Date: 10/12/2021 CLINICAL DATA:  Dizziness, assess intracranial arteries EXAM: CT ANGIOGRAPHY HEAD AND NECK TECHNIQUE: Multidetector CT imaging of the head and neck was performed using the standard protocol during bolus administration of intravenous contrast. Multiplanar CT image reconstructions and MIPs were obtained to evaluate the vascular anatomy. Carotid stenosis measurements (when applicable) are obtained utilizing NASCET criteria, using the distal internal carotid diameter as the denominator. RADIATION DOSE REDUCTION: This exam was performed according to the departmental dose-optimization program which includes automated exposure control, adjustment of the mA and/or kV according to patient size and/or use of iterative reconstruction technique. CONTRAST:  73m OMNIPAQUE IOHEXOL 350 MG/ML SOLN COMPARISON:  Same-day noncontrast head CT, CTA neck 11/30/2015 FINDINGS: CTA NECK FINDINGS Aortic arch: There is calcified atherosclerotic plaque in the imaged aortic arch. The origins of the major branch vessels are patent. Subclavian arteries are patent to the level imaged with scattered calcified plaque. Right carotid system: Right common carotid artery is patent. There is calcified plaque at the bifurcation resulting in moderate to severe stenosis at the origin of the right external carotid artery. There is no hemodynamically significant stenosis of the right internal carotid artery. The right internal carotid artery is tortuous in the neck. There is no evidence of dissection or aneurysm. Left carotid system: The left common carotid artery is patent. There is calcified plaque at the bifurcation resulting in up to approximately 50-60% stenosis at the origin of the left internal carotid artery. The distal left internal carotid artery  is patent with tortuosity laterally. The left external carotid artery is patent. There is no evidence of dissection or aneurysm. Vertebral arteries: There is focal moderate stenosis in the proximal right V1 segment (7-243. Remainder of the right vertebral artery is patent throughout the neck. The left vertebral artery is patent throughout the neck. There is no evidence of dissection or aneurysm. Skeleton: There is mild multilevel degenerative change of the cervical spine. There is no acute osseous abnormality or suspicious osseous lesion. Other neck: The soft tissues of the neck are unremarkable. There is a small (4 mm hyperdense nodule in the right aspect of the right globe (7-120). Upper chest: The imaged lung apices are clear. Review of the MIP images confirms the above findings CTA HEAD FINDINGS Anterior  circulation: There is calcified atherosclerotic plaque in the bilateral intracranial ICAs resulting in mild-to-moderate stenosis of the bilateral supraclinoid segments. The bilateral MCAs are patent without proximal high-grade stenosis or occlusion. The bilateral ACAs are patent, without proximal high-grade stenosis or occlusion. The anterior communicating artery is normal. There is no aneurysm or AVM. Posterior circulation: There is severe stenosis with diminished enhancement of the distal right V4 segment prior to the vertebrobasilar junction, new/progressed since 2017 suspicious for mural adherent clot. Clot appears to extend into the basilar artery resulting in at least moderate stenosis (8-108). The distal basilar artery is patent. PICA is not identified on the right. There is calcified plaque throughout the left V4 segment resulting in multifocal mild-to-moderate stenosis. PICA is identified on the left. The bilateral PCAs are patent. The posterior communicating arteries are not identified. Venous sinuses: Suboptimally evaluated due to bolus timing. Anatomic variants: None. Review of the MIP images  confirms the above findings IMPRESSION: 1. Diminished enhancement of the right V4 segment prior to the vertebrobasilar junction suspicious for mural adherent clot resulting in severe stenosis. Clot extends into the basilar artery resulting in at least moderate stenosis. The distal basilar artery is patent. Recommend NIR consultation. 2. Otherwise, patent intracranial vasculature with calcified atherosclerotic plaque in the intracranial ICAs resulting in mild-to-moderate stenosis bilaterally. 3. Calcified atherosclerotic plaque of the bilateral carotid bifurcations resulting in approximately 50-60% stenosis on the left and no hemodynamically significant stenosis on the right. 4. Small hyperdense nodule in the right aspect of the right globe of uncertain etiology. Consider correlation with fundoscopic exam. These results were called by telephone at the time of interpretation on 10/12/2021 at 12:34 pm to provider Beulah Gandy, NP , who verbally acknowledged these results. Electronically Signed   By: Valetta Mole M.D.   On: 10/12/2021 12:34   CT HEAD WO CONTRAST (5MM)  Result Date: 10/13/2021 CLINICAL DATA:  Stroke, follow up EXAM: CT HEAD WITHOUT CONTRAST TECHNIQUE: Contiguous axial images were obtained from the base of the skull through the vertex without intravenous contrast. RADIATION DOSE REDUCTION: This exam was performed according to the departmental dose-optimization program which includes automated exposure control, adjustment of the mA and/or kV according to patient size and/or use of iterative reconstruction technique. COMPARISON:  10/12/2021 FINDINGS: Brain: No evidence of acute infarction, hemorrhage, hydrocephalus, extra-axial collection or mass lesion/mass effect. Similar degree of patchy low-density changes within the periventricular and subcortical white matter compatible with chronic microvascular ischemic change. Mild-moderate diffuse cerebral volume loss. Vascular: Atherosclerotic calcifications  involving the large vessels of the skull base. No unexpected hyperdense vessel. Skull: Normal. Negative for fracture or focal lesion. Sinuses/Orbits: Paranasal sinuses are clear. Small nodule within the right lobe again noted. Other: None. IMPRESSION: Stable exam.  No new or acute intracranial abnormality. Electronically Signed   By: Davina Poke D.O.   On: 10/13/2021 13:45   CT HEAD WO CONTRAST (5MM)  Result Date: 10/12/2021 CLINICAL DATA:  Stroke, follow-up; possible stroke versus artifact in the cerebellum EXAM: CT HEAD WITHOUT CONTRAST TECHNIQUE: Contiguous axial images were obtained from the base of the skull through the vertex without intravenous contrast. RADIATION DOSE REDUCTION: This exam was performed according to the departmental dose-optimization program which includes automated exposure control, adjustment of the mA and/or kV according to patient size and/or use of iterative reconstruction technique. COMPARISON:  Earlier same day FINDINGS: Brain: No acute intracranial hemorrhage or mass effect. No new loss of gray-white differentiation. A cerebellar infarct is not identified. A rounded area in  the left ventral pons in the axial plane appears reflect artifact and other planes. Ventricles and sulci are stable in size and configuration. Stable findings of chronic microvascular ischemic changes. No acute intracranial hemorrhage identified. Vascular: Residual contrast from recent CTA. Atherosclerotic calcification at the skull base. Skull: Unremarkable. Sinuses/Orbits: No new finding. IMPRESSION: No acute intracranial hemorrhage or new loss of gray-white differentiation. A cerebellar infarct is not identified Electronically Signed   By: Macy Mis M.D.   On: 10/12/2021 13:41   CT Head Wo Contrast  Result Date: 10/12/2021 CLINICAL DATA:  Dizziness. EXAM: CT HEAD WITHOUT CONTRAST TECHNIQUE: Contiguous axial images were obtained from the base of the skull through the vertex without intravenous  contrast. RADIATION DOSE REDUCTION: This exam was performed according to the departmental dose-optimization program which includes automated exposure control, adjustment of the mA and/or kV according to patient size and/or use of iterative reconstruction technique. COMPARISON:  Sep 14, 2014. FINDINGS: Brain: No evidence of acute infarction, hemorrhage, hydrocephalus, extra-axial collection or mass lesion/mass effect. Vascular: No hyperdense vessel or unexpected calcification. Skull: Normal. Negative for fracture or focal lesion. Sinuses/Orbits: No acute finding. Other: None. IMPRESSION: No acute intracranial abnormality seen. Electronically Signed   By: Marijo Conception M.D.   On: 10/12/2021 07:46   DG CHEST PORT 1 VIEW  Result Date: 10/13/2021 CLINICAL DATA:  Acute dyspnea. EXAM: PORTABLE CHEST 1 VIEW COMPARISON:  Radiograph yesterday. FINDINGS: Left-sided pacemaker remains in place. Cardiomegaly is unchanged. Aortic atherosclerosis. Bibasilar atelectasis, right greater than left. No pulmonary edema. Minimal blunting of costophrenic angles without significant effusion. No pneumothorax. IMPRESSION: 1. Unchanged cardiomegaly. 2. Bibasilar atelectasis, right greater than left. Electronically Signed   By: Keith Rake M.D.   On: 10/13/2021 01:58   DG Chest Portable 1 View  Result Date: 10/12/2021 CLINICAL DATA:  88 year old female with history of dyspnea, cough and congestion. EXAM: PORTABLE CHEST 1 VIEW COMPARISON:  Chest x-ray 02/04/2021. FINDINGS: Lung volumes are normal. No consolidative airspace disease. No pleural effusions. No pneumothorax. No evidence of pulmonary edema. Heart size is mildly enlarged. Upper mediastinal contours are within normal limits allowing for patient positioning. Atherosclerotic calcifications in the thoracic aorta. Left-sided pacemaker device in place with lead tips projecting over the expected location of the right atrium and right ventricular apex. IMPRESSION: 1. No  radiographic evidence of acute cardiopulmonary disease. 2. Mild cardiomegaly. 3. Aortic atherosclerosis. Electronically Signed   By: Vinnie Langton M.D.   On: 10/12/2021 08:05   ECHOCARDIOGRAM COMPLETE  Result Date: 10/13/2021    ECHOCARDIOGRAM REPORT   Patient Name:   ALEENAH HOMEN Date of Exam: 10/13/2021 Medical Rec #:  [id number]     Height:       63.0 in Accession #:    [id number]    Weight:       122.0 lb Date of Birth:  Dec 31, 1925      BSA:          1.567 m Patient Age:    66 years      BP:           157/75 mmHg Patient Gender: F             HR:           70 bpm. Exam Location:  Inpatient Procedure: 2D Echo, 3D Echo, Color Doppler and Cardiac Doppler Indications:    Stroke i63.9  History:        Patient has prior history of Echocardiogram examinations, most  recent 01/29/2021. CHF, Pacemaker, COPD, Arrythmias:Atrial                 Fibrillation and RBBB; Risk Factors:Hypertension and                 Dyslipidemia.  Sonographer:    Raquel Sarna Senior RDCS Referring Phys: [id number] [place]  1. Left ventricular ejection fraction, by estimation, is 60 to 65%. The left ventricle has normal function. The left ventricle has no regional wall motion abnormalities. There is moderate left ventricular hypertrophy. Left ventricular diastolic parameters are indeterminate.  2. Right ventricular systolic function is normal. The right ventricular size is mildly enlarged. There is severely elevated pulmonary artery systolic pressure. The estimated right ventricular systolic pressure is 11.9 mmHg.  3. Right atrial size was severely dilated.  4. Left atrial size was severely dilated.  5. The mitral valve is degenerative. Mild to moderate mitral valve regurgitation. Severe mitral stenosis. The mean mitral valve gradient is 12.0 mmHg with average heart rate of 70 bpm. Severe mitral annular calcification. MVA 1.0 cm^2 by continuity equation.  6. The tricuspid valve is abnormal. Tricuspid valve regurgitation  is severe.  7. The aortic valve is calcified. There is severe calcifcation of the aortic valve. Aortic valve regurgitation is moderate. Moderate to severe aortic valve stenosis. Vmax 3.3 m/s, MG 21 mmHg, AVA 0.9 cm^2, DI 0.32  8. Aortic dilatation noted. There is dilatation of the ascending aorta, measuring 40 mm.  9. The inferior vena cava is dilated in size with <50% respiratory variability, suggesting right atrial pressure of 15 mmHg. FINDINGS  Left Ventricle: Left ventricular ejection fraction, by estimation, is 60 to 65%. The left ventricle has normal function. The left ventricle has no regional wall motion abnormalities. The left ventricular internal cavity size was normal in size. There is  moderate left ventricular hypertrophy. Left ventricular diastolic parameters are indeterminate. Right Ventricle: The right ventricular size is mildly enlarged. No increase in right ventricular wall thickness. Right ventricular systolic function is normal. There is severely elevated pulmonary artery systolic pressure. The tricuspid regurgitant velocity is 3.62 m/s, and with an assumed right atrial pressure of 15 mmHg, the estimated right ventricular systolic pressure is 14.7 mmHg. Left Atrium: Left atrial size was severely dilated. Right Atrium: Right atrial size was severely dilated. Pericardium: There is no evidence of pericardial effusion. Mitral Valve: The mitral valve is degenerative in appearance. Severe mitral annular calcification. Mild to moderate mitral valve regurgitation. Severe mitral valve stenosis. MV peak gradient, 35.4 mmHg. The mean mitral valve gradient is 12.0 mmHg with average heart rate of 70 bpm. Tricuspid Valve: The tricuspid valve is abnormal. Tricuspid valve regurgitation is severe. Aortic Valve: The aortic valve is calcified. There is severe calcifcation of the aortic valve. Aortic valve regurgitation is moderate. Aortic regurgitation PHT measures 534 msec. Moderate to severe aortic stenosis is  present. Aortic valve mean gradient measures 20.0 mmHg. Aortic valve peak gradient measures 41.7 mmHg. Aortic valve area, by VTI measures 0.92 cm. Pulmonic Valve: The pulmonic valve was not well visualized. Pulmonic valve regurgitation is not visualized. Aorta: The aortic root is normal in size and structure and aortic dilatation noted. There is dilatation of the ascending aorta, measuring 40 mm. Venous: The inferior vena cava is dilated in size with less than 50% respiratory variability, suggesting right atrial pressure of 15 mmHg. IAS/Shunts: The interatrial septum was not well visualized.  LEFT VENTRICLE PLAX 2D LVIDd:         4.00  cm   Diastology LVIDs:         2.80 cm   LV e' medial:    6.64 cm/s LV PW:         1.20 cm   LV E/e' medial:  31.7 LV IVS:        1.10 cm   LV e' lateral:   10.20 cm/s LVOT diam:     1.90 cm   LV E/e' lateral: 20.6 LV SV:         55 LV SV Index:   35 LVOT Area:     2.84 cm  RIGHT VENTRICLE RV S prime:     11.40 cm/s TAPSE (M-mode): 1.2 cm LEFT ATRIUM              Index        RIGHT ATRIUM           Index LA diam:        4.20 cm  2.68 cm/m   RA Area:     29.80 cm LA Vol (A2C):   113.0 ml 72.10 ml/m  RA Volume:   101.00 ml 64.44 ml/m LA Vol (A4C):   75.5 ml  48.17 ml/m LA Biplane Vol: 97.6 ml  62.27 ml/m  AORTIC VALVE AV Area (Vmax):    1.01 cm AV Area (Vmean):   1.06 cm AV Area (VTI):     0.92 cm AV Vmax:           323.00 cm/s AV Vmean:          200.500 cm/s AV VTI:            0.595 m AV Peak Grad:      41.7 mmHg AV Mean Grad:      20.0 mmHg LVOT Vmax:         115.00 cm/s LVOT Vmean:        75.000 cm/s LVOT VTI:          0.193 m LVOT/AV VTI ratio: 0.32 AI PHT:            534 msec AR Vena Contracta: 0.40 cm  AORTA Ao Root diam: 3.20 cm Ao Asc diam:  4.00 cm MITRAL VALVE                  TRICUSPID VALVE MV Area (PHT): 3.10 cm       TR Peak grad:   52.4 mmHg MV Area VTI:   1.06 cm       TR Vmax:        362.00 cm/s MV Peak grad:  35.4 mmHg MV Mean grad:  12.0 mmHg      SHUNTS  MV Vmax:       2.98 m/s       Systemic VTI:  0.19 m MV Vmean:      151.3 cm/s     Systemic Diam: 1.90 cm MV Decel Time: 245 msec MR Peak grad:    92.5 mmHg MR Mean grad:    42.0 mmHg MR Vmax:         481.00 cm/s MR Vmean:        306.0 cm/s MR PISA:         4.02 cm MR PISA Eff ROA: 32 mm MR PISA Radius:  0.80 cm MV E velocity: 210.50 cm/s Oswaldo Milian MD Electronically signed by Oswaldo Milian MD Signature Date/Time: 10/13/2021/3:12:41 PM    Final     Microbiology: Results for orders placed or performed during the  hospital encounter of 02/04/21  Resp Panel by RT-PCR (Flu A&B, Covid) Nasopharyngeal Swab     Status: None   Collection Time: 02/04/21  9:40 PM   Specimen: Nasopharyngeal Swab; Nasopharyngeal(NP) swabs in vial transport medium  Result Value Ref Range Status   SARS Coronavirus 2 by RT PCR NEGATIVE NEGATIVE Final    Comment: (NOTE) SARS-CoV-2 target nucleic acids are NOT DETECTED.  The SARS-CoV-2 RNA is generally detectable in upper respiratory specimens during the acute phase of infection. The lowest concentration of SARS-CoV-2 viral copies this assay can detect is 138 copies/mL. A negative result does not preclude SARS-Cov-2 infection and should not be used as the sole basis for treatment or other patient management decisions. A negative result may occur with  improper specimen collection/handling, submission of specimen other than nasopharyngeal swab, presence of viral mutation(s) within the areas targeted by this assay, and inadequate number of viral copies(<138 copies/mL). A negative result must be combined with clinical observations, patient history, and epidemiological information. The expected result is Negative.  Fact Sheet for Patients:  EntrepreneurPulse.com.au  Fact Sheet for Healthcare Providers:  IncredibleEmployment.be  This test is no t yet approved or cleared by the [place] FDA and  has been authorized for  detection and/or diagnosis of SARS-CoV-2 by FDA under an Emergency Use Authorization (EUA). This EUA will remain  in effect (meaning this test can be used) for the duration of the COVID-19 declaration under Section 564(b)(1) of the Act, 21 U.S.C.section 360bbb-3(b)(1), unless the authorization is terminated  or revoked sooner.       Influenza A by PCR NEGATIVE NEGATIVE Final   Influenza B by PCR NEGATIVE NEGATIVE Final    Comment: (NOTE) The Xpert Xpress SARS-CoV-2/FLU/RSV plus assay is intended as an aid in the diagnosis of influenza from Nasopharyngeal swab specimens and should not be used as a sole basis for treatment. Nasal washings and aspirates are unacceptable for Xpert Xpress SARS-CoV-2/FLU/RSV testing.  Fact Sheet for Patients: EntrepreneurPulse.com.au  Fact Sheet for Healthcare Providers: IncredibleEmployment.be  This test is not yet approved or cleared by the [place] FDA and has been authorized for detection and/or diagnosis of SARS-CoV-2 by FDA under an Emergency Use Authorization (EUA). This EUA will remain in effect (meaning this test can be used) for the duration of the COVID-19 declaration under Section 564(b)(1) of the Act, 21 U.S.C. section 360bbb-3(b)(1), unless the authorization is terminated or revoked.  Performed at [facility], [place] [address], [place], [place] [address]   MRSA Next Gen by PCR, Nasal     Status: None   Collection Time: 02/05/21  5:08 PM   Specimen: Nasal Mucosa; Nasal Swab  Result Value Ref Range Status   MRSA by PCR Next Gen NOT DETECTED NOT DETECTED Final    Comment: (NOTE) The GeneXpert MRSA Assay (FDA approved for NASAL specimens only), is one component of a comprehensive MRSA colonization surveillance program. It is not intended to diagnose MRSA infection nor to guide or monitor treatment for MRSA infections. Test performance is not FDA approved in patients less than 110  years old. Performed at [facility], [place] [address], [place], [place] [address]     Labs: CBC: Recent Labs  Lab 10/12/21 0615 10/13/21 0055 10/14/21 0246  WBC 7.2 7.8 9.9  HGB 14.7 14.2 13.8  HCT 46.1* 43.3 42.1  MCV 95.6 93.5 94.0  PLT 159 151 403*   Basic Metabolic Panel: Recent Labs  Lab 10/12/21 0615 10/13/21 0055  NA 139  --   K 4.5  --   CL 104  --   CO2 25  --   GLUCOSE 113*  --   BUN 20  --   CREATININE 0.87  --   CALCIUM 9.1  --   MG  --  2.1   Liver Function Tests: Recent Labs  Lab 10/12/21 1210  AST 30  ALT 18  ALKPHOS 104  BILITOT 1.6*  PROT 5.9*  ALBUMIN 3.3*   CBG: No results for input(s): GLUCAP in the last 168 hours.  Discharge time spent: greater than 30 minutes.  Signed: Flora Lipps, MD [facility] 10/14/2021

## 2021-10-16 ENCOUNTER — Other Ambulatory Visit: Payer: Self-pay | Admitting: Cardiovascular Disease

## 2021-10-17 ENCOUNTER — Other Ambulatory Visit: Payer: Self-pay

## 2021-10-17 NOTE — Telephone Encounter (Signed)
Pradaxa refill request sent today by another provider to same pharmacy. Will not resend at this time.

## 2021-10-17 NOTE — Telephone Encounter (Signed)
Prescription refill request for Pradaxa received.  Indication:Afib Last office visit:3/23 Weight:55.3 kg Age:88 Scr:0.8 CrCl:38.35  Prescription refilled

## 2021-10-19 DIAGNOSIS — J432 Centrilobular emphysema: Secondary | ICD-10-CM | POA: Diagnosis not present

## 2021-10-19 DIAGNOSIS — H8110 Benign paroxysmal vertigo, unspecified ear: Secondary | ICD-10-CM | POA: Diagnosis not present

## 2021-10-19 DIAGNOSIS — I7102 Dissection of abdominal aorta: Secondary | ICD-10-CM | POA: Diagnosis not present

## 2021-10-19 DIAGNOSIS — R42 Dizziness and giddiness: Secondary | ICD-10-CM | POA: Diagnosis not present

## 2021-10-19 DIAGNOSIS — I480 Paroxysmal atrial fibrillation: Secondary | ICD-10-CM | POA: Diagnosis not present

## 2021-10-19 DIAGNOSIS — I712 Thoracic aortic aneurysm, without rupture, unspecified: Secondary | ICD-10-CM | POA: Diagnosis not present

## 2021-10-19 DIAGNOSIS — D6869 Other thrombophilia: Secondary | ICD-10-CM | POA: Diagnosis not present

## 2021-10-19 DIAGNOSIS — I700 Atherosclerosis of aorta: Secondary | ICD-10-CM | POA: Diagnosis not present

## 2021-10-19 DIAGNOSIS — N1831 Chronic kidney disease, stage 3a: Secondary | ICD-10-CM | POA: Diagnosis not present

## 2021-10-19 DIAGNOSIS — I714 Abdominal aortic aneurysm, without rupture, unspecified: Secondary | ICD-10-CM | POA: Diagnosis not present

## 2021-10-19 DIAGNOSIS — I10 Essential (primary) hypertension: Secondary | ICD-10-CM | POA: Diagnosis not present

## 2021-10-19 DIAGNOSIS — E782 Mixed hyperlipidemia: Secondary | ICD-10-CM | POA: Diagnosis not present

## 2021-10-31 ENCOUNTER — Other Ambulatory Visit: Payer: Self-pay

## 2021-10-31 ENCOUNTER — Ambulatory Visit (INDEPENDENT_AMBULATORY_CARE_PROVIDER_SITE_OTHER): Payer: Medicare Other

## 2021-10-31 DIAGNOSIS — I442 Atrioventricular block, complete: Secondary | ICD-10-CM

## 2021-10-31 MED ORDER — FUROSEMIDE 40 MG PO TABS: 40.0000 mg | ORAL_TABLET | Freq: Every day | ORAL | 0 refills | Status: DC

## 2021-11-01 LAB — CUP PACEART REMOTE DEVICE CHECK
Battery Remaining Longevity: 96 mo
Battery Remaining Percentage: 100 %
Brady Statistic RA Percent Paced: 0 %
Brady Statistic RV Percent Paced: 99 %
Date Time Interrogation Session: 20230621015400
Implantable Lead Implant Date: 20131211
Implantable Lead Implant Date: 20131211
Implantable Lead Location: 753859
Implantable Lead Location: 753860
Implantable Lead Model: 4135
Implantable Lead Model: 4136
Implantable Lead Serial Number: 29296655
Implantable Lead Serial Number: 29317536
Implantable Pulse Generator Implant Date: 20221205
Lead Channel Impedance Value: 582 Ohm
Lead Channel Impedance Value: 631 Ohm
Lead Channel Setting Pacing Amplitude: 3 V
Lead Channel Setting Pacing Pulse Width: 0.4 ms
Lead Channel Setting Sensing Sensitivity: 3.5 mV
Pulse Gen Serial Number: 646589

## 2021-11-12 NOTE — Progress Notes (Signed)
Remote pacemaker transmission.   

## 2021-12-31 ENCOUNTER — Other Ambulatory Visit: Payer: Self-pay | Admitting: Cardiovascular Disease

## 2022-01-16 ENCOUNTER — Other Ambulatory Visit: Payer: Self-pay | Admitting: Cardiovascular Disease

## 2022-01-30 ENCOUNTER — Ambulatory Visit (INDEPENDENT_AMBULATORY_CARE_PROVIDER_SITE_OTHER): Payer: Medicare Other

## 2022-01-30 DIAGNOSIS — I442 Atrioventricular block, complete: Secondary | ICD-10-CM

## 2022-02-01 LAB — CUP PACEART REMOTE DEVICE CHECK
Battery Remaining Longevity: 96 mo
Battery Remaining Percentage: 100 %
Brady Statistic RA Percent Paced: 0 %
Brady Statistic RV Percent Paced: 99 %
Date Time Interrogation Session: 20230920014100
Implantable Lead Implant Date: 20131211
Implantable Lead Implant Date: 20131211
Implantable Lead Location: 753859
Implantable Lead Location: 753860
Implantable Lead Model: 4135
Implantable Lead Model: 4136
Implantable Lead Serial Number: 29296655
Implantable Lead Serial Number: 29317536
Implantable Pulse Generator Implant Date: 20221205
Lead Channel Impedance Value: 567 Ohm
Lead Channel Impedance Value: 625 Ohm
Lead Channel Setting Pacing Amplitude: 3 V
Lead Channel Setting Pacing Pulse Width: 0.4 ms
Lead Channel Setting Sensing Sensitivity: 3.5 mV
Pulse Gen Serial Number: 646589

## 2022-02-02 ENCOUNTER — Other Ambulatory Visit: Payer: Self-pay | Admitting: Cardiovascular Disease

## 2022-02-07 DIAGNOSIS — Z85828 Personal history of other malignant neoplasm of skin: Secondary | ICD-10-CM | POA: Diagnosis not present

## 2022-02-07 DIAGNOSIS — Z08 Encounter for follow-up examination after completed treatment for malignant neoplasm: Secondary | ICD-10-CM | POA: Diagnosis not present

## 2022-02-07 DIAGNOSIS — D225 Melanocytic nevi of trunk: Secondary | ICD-10-CM | POA: Diagnosis not present

## 2022-02-07 DIAGNOSIS — L821 Other seborrheic keratosis: Secondary | ICD-10-CM | POA: Diagnosis not present

## 2022-02-07 DIAGNOSIS — L814 Other melanin hyperpigmentation: Secondary | ICD-10-CM | POA: Diagnosis not present

## 2022-02-12 NOTE — Progress Notes (Signed)
Remote pacemaker transmission.   

## 2022-02-21 ENCOUNTER — Telehealth: Payer: Self-pay | Admitting: Cardiovascular Disease

## 2022-02-21 ENCOUNTER — Other Ambulatory Visit (HOSPITAL_COMMUNITY): Payer: Self-pay

## 2022-02-21 MED ORDER — DABIGATRAN ETEXILATE MESYLATE 75 MG PO CAPS
75.0000 mg | ORAL_CAPSULE | Freq: Two times a day (BID) | ORAL | 6 refills | Status: DC
  Filled 2022-02-21: qty 60, 30d supply, fill #0

## 2022-02-21 NOTE — Telephone Encounter (Signed)
Pt c/o medication issue:  1. Name of Medication: dabigatran (PRADAXA) 75 MG CAPS capsule  2. How are you currently taking this medication (dosage and times per day)? TAKE 1 CAPSULE(75 MG) BY MOUTH EVERY 12 HOURS  3. Are you having a reaction (difficulty breathing--STAT)?   4. What is your medication issue? Pharmacy called stating this pt's insurance will only pay for the generic brand. She states she has called other pharmacies for the generic but she couldn't find anyone who had it. She states pt will need prior authorization for the brand name or be prescribed a new medication.

## 2022-02-21 NOTE — Telephone Encounter (Signed)
Spoke to PharmD at [facility] generic pradaxa is on back order.Patient's insurance requires a prior auth for the brand name. Called patient left message on personal voice mail I will send message to our prior auth nurse to get brand name approved.

## 2022-02-21 NOTE — Telephone Encounter (Signed)
Generic Praxada on back order with all the [facility] is out too. Spoke to [facility].They have generic pradaxa.Prescription phoned in. Spoke to patient she will have caregiver pick up.

## 2022-02-24 ENCOUNTER — Emergency Department (HOSPITAL_COMMUNITY): Payer: Medicare Other

## 2022-02-24 ENCOUNTER — Other Ambulatory Visit: Payer: Self-pay

## 2022-02-24 ENCOUNTER — Encounter (HOSPITAL_COMMUNITY): Payer: Self-pay | Admitting: *Deleted

## 2022-02-24 ENCOUNTER — Emergency Department (HOSPITAL_COMMUNITY)
Admission: EM | Admit: 2022-02-24 | Discharge: 2022-02-24 | Disposition: A | Discharge status: Home / Self Care | Payer: Medicare Other | Attending: Emergency Medicine | Admitting: Emergency Medicine

## 2022-02-24 DIAGNOSIS — R519 Headache, unspecified: Secondary | ICD-10-CM | POA: Diagnosis not present

## 2022-02-24 DIAGNOSIS — J90 Pleural effusion, not elsewhere classified: Secondary | ICD-10-CM | POA: Insufficient documentation

## 2022-02-24 DIAGNOSIS — G4489 Other headache syndrome: Secondary | ICD-10-CM | POA: Diagnosis not present

## 2022-02-24 DIAGNOSIS — I119 Hypertensive heart disease without heart failure: Secondary | ICD-10-CM | POA: Diagnosis not present

## 2022-02-24 DIAGNOSIS — I700 Atherosclerosis of aorta: Secondary | ICD-10-CM | POA: Diagnosis not present

## 2022-02-24 DIAGNOSIS — Z79899 Other long term (current) drug therapy: Secondary | ICD-10-CM | POA: Insufficient documentation

## 2022-02-24 DIAGNOSIS — R0602 Shortness of breath: Secondary | ICD-10-CM | POA: Diagnosis not present

## 2022-02-24 DIAGNOSIS — I6623 Occlusion and stenosis of bilateral posterior cerebral arteries: Secondary | ICD-10-CM | POA: Diagnosis not present

## 2022-02-24 DIAGNOSIS — I10 Essential (primary) hypertension: Secondary | ICD-10-CM | POA: Insufficient documentation

## 2022-02-24 DIAGNOSIS — R42 Dizziness and giddiness: Secondary | ICD-10-CM | POA: Diagnosis not present

## 2022-02-24 DIAGNOSIS — I6523 Occlusion and stenosis of bilateral carotid arteries: Secondary | ICD-10-CM | POA: Diagnosis not present

## 2022-02-24 DIAGNOSIS — I672 Cerebral atherosclerosis: Secondary | ICD-10-CM | POA: Diagnosis not present

## 2022-02-24 LAB — CBC WITH DIFFERENTIAL/PLATELET
Abs Immature Granulocytes: 0.03 10*3/uL (ref 0.00–0.07)
Basophils Absolute: 0 10*3/uL (ref 0.0–0.1)
Basophils Relative: 1 %
Eosinophils Absolute: 0.1 10*3/uL (ref 0.0–0.5)
Eosinophils Relative: 1 %
HCT: 41.9 % (ref 36.0–46.0)
Hemoglobin: 13.9 g/dL (ref 12.0–15.0)
Immature Granulocytes: 0 %
Lymphocytes Relative: 17 %
Lymphs Abs: 1.2 10*3/uL (ref 0.7–4.0)
MCH: 31.1 pg (ref 26.0–34.0)
MCHC: 33.2 g/dL (ref 30.0–36.0)
MCV: 93.7 fL (ref 80.0–100.0)
Monocytes Absolute: 0.8 10*3/uL (ref 0.1–1.0)
Monocytes Relative: 11 %
Neutro Abs: 5.2 10*3/uL (ref 1.7–7.7)
Neutrophils Relative %: 70 %
Platelets: 148 10*3/uL — ABNORMAL LOW (ref 150–400)
RBC: 4.47 MIL/uL (ref 3.87–5.11)
RDW: 14.9 % (ref 11.5–15.5)
WBC: 7.4 10*3/uL (ref 4.0–10.5)
nRBC: 0 % (ref 0.0–0.2)

## 2022-02-24 LAB — TROPONIN I (HIGH SENSITIVITY)
Troponin I (High Sensitivity): 12 ng/L (ref <18)
Troponin I (High Sensitivity): 13 ng/L (ref <18)

## 2022-02-24 LAB — COMPREHENSIVE METABOLIC PANEL
ALT: 14 U/L (ref 0–44)
AST: 24 U/L (ref 15–41)
Albumin: 3.4 g/dL — ABNORMAL LOW (ref 3.5–5.0)
Alkaline Phosphatase: 91 U/L (ref 38–126)
Anion gap: 13 (ref 5–15)
BUN: 18 mg/dL (ref 8–23)
CO2: 24 mmol/L (ref 22–32)
Calcium: 9.3 mg/dL (ref 8.9–10.3)
Chloride: 103 mmol/L (ref 98–111)
Creatinine, Ser: 0.99 mg/dL (ref 0.44–1.00)
GFR, Estimated: 53 mL/min — ABNORMAL LOW (ref >60)
Glucose, Bld: 132 mg/dL — ABNORMAL HIGH (ref 70–99)
Potassium: 3.6 mmol/L (ref 3.5–5.1)
Sodium: 140 mmol/L (ref 135–145)
Total Bilirubin: 1.1 mg/dL (ref 0.3–1.2)
Total Protein: 5.9 g/dL — ABNORMAL LOW (ref 6.5–8.1)

## 2022-02-24 LAB — SEDIMENTATION RATE: Sed Rate: 3 mm/hr (ref 0–22)

## 2022-02-24 LAB — BRAIN NATRIURETIC PEPTIDE: B Natriuretic Peptide: 1043.6 pg/mL — ABNORMAL HIGH (ref 0.0–100.0)

## 2022-02-24 MED ORDER — METOPROLOL SUCCINATE ER 25 MG PO TB24
50.0000 mg | ORAL_TABLET | Freq: Every day | ORAL | Status: DC
  Administered 2022-02-24: 50 mg via ORAL
  Filled 2022-02-24: qty 2

## 2022-02-24 MED ORDER — ISOSORBIDE MONONITRATE ER 30 MG PO TB24
30.0000 mg | ORAL_TABLET | Freq: Every day | ORAL | Status: DC
  Administered 2022-02-24: 30 mg via ORAL
  Filled 2022-02-24: qty 1

## 2022-02-24 MED ORDER — PROCHLORPERAZINE EDISYLATE 10 MG/2ML IJ SOLN
5.0000 mg | Freq: Once | INTRAMUSCULAR | Status: AC
Start: 2022-02-24 — End: 2022-02-24
  Administered 2022-02-24: 5 mg via INTRAVENOUS
  Filled 2022-02-24: qty 2

## 2022-02-24 MED ORDER — HYDRALAZINE HCL 20 MG/ML IJ SOLN
5.0000 mg | Freq: Once | INTRAMUSCULAR | Status: AC
Start: 2022-02-24 — End: 2022-02-24
  Administered 2022-02-24: 5 mg via INTRAVENOUS
  Filled 2022-02-24: qty 1

## 2022-02-24 MED ORDER — METOPROLOL SUCCINATE ER 25 MG PO TB24: 50.0000 mg | ORAL_TABLET | Freq: Every day | ORAL | Status: DC

## 2022-02-24 MED ORDER — LACTATED RINGERS IV BOLUS
500.0000 mL | Freq: Once | INTRAVENOUS | Status: AC
  Administered 2022-02-24: 500 mL via INTRAVENOUS

## 2022-02-24 MED ORDER — FENTANYL CITRATE PF 50 MCG/ML IJ SOSY
25.0000 ug | PREFILLED_SYRINGE | Freq: Once | INTRAMUSCULAR | Status: AC
  Administered 2022-02-24: 25 ug via INTRAVENOUS
  Filled 2022-02-24: qty 1

## 2022-02-24 MED ORDER — ISOSORBIDE MONONITRATE ER 30 MG PO TB24: 30.0000 mg | ORAL_TABLET | Freq: Every day | ORAL | Status: DC

## 2022-02-24 MED ORDER — DEXAMETHASONE SODIUM PHOSPHATE 10 MG/ML IJ SOLN
10.0000 mg | Freq: Once | INTRAMUSCULAR | Status: AC
  Administered 2022-02-24: 10 mg via INTRAVENOUS
  Filled 2022-02-24: qty 1

## 2022-02-24 MED ORDER — LIDOCAINE 5 % EX PTCH
1.0000 | MEDICATED_PATCH | CUTANEOUS | Status: DC
  Administered 2022-02-24: 1 via TRANSDERMAL
  Filled 2022-02-24: qty 1

## 2022-02-24 MED ORDER — KETOROLAC TROMETHAMINE 15 MG/ML IJ SOLN
15.0000 mg | Freq: Once | INTRAMUSCULAR | Status: AC
  Administered 2022-02-24: 15 mg via INTRAVENOUS
  Filled 2022-02-24: qty 1

## 2022-02-24 MED ORDER — VALPROATE SODIUM 100 MG/ML IV SOLN
500.0000 mg | Freq: Once | INTRAVENOUS | Status: AC
  Administered 2022-02-24: 500 mg via INTRAVENOUS
  Filled 2022-02-24: qty 5

## 2022-02-24 MED ORDER — METOPROLOL TARTRATE 5 MG/5ML IV SOLN
5.0000 mg | Freq: Once | INTRAVENOUS | Status: AC
  Administered 2022-02-24: 5 mg via INTRAVENOUS
  Filled 2022-02-24: qty 5

## 2022-02-24 MED ORDER — IOHEXOL 350 MG/ML SOLN
75.0000 mL | Freq: Once | INTRAVENOUS | Status: AC | PRN
  Administered 2022-02-24: 75 mL via INTRAVENOUS

## 2022-02-24 NOTE — ED Notes (Signed)
To ct

## 2022-02-24 NOTE — Discharge Instructions (Signed)
Your CT scan is negative for bleeding or aneurysm.  Your blood pressure has improved.  Keep a record of your blood pressure and follow-up with your doctor for medication adjustments.  Return to the ED with severe headache, difficulty speaking, difficulty swallowing, unilateral weakness, numbness or tingling, chest pain, shortness of breath or other concerns.

## 2022-02-24 NOTE — ED Provider Notes (Signed)
[facility] EMERGENCY DEPARTMENT Provider Note   CSN: [id number] Arrival date & time: 02/24/22  0938     History  Chief Complaint  Patient presents with   Headache    [name] is a 88 y.o. female.  88 year old female history of headaches and hypertension that presents the ER today with both.  Patient states that she started having headache around 7:00.  She tried a few Tylenol did not seem to help.  It persisted without neurologic changes however the headache was severe in nature so she presents here for further evaluation.  She states she is compliant with her medications.   Headache      Home Medications Prior to Admission medications   Medication Sig Start Date End Date Taking? Authorizing Provider  calcium carbonate (OSCAL) 1500 (600 Ca) MG TABS tablet Take 600 mg by mouth daily.    [provider]  Calcium Carbonate-Vitamin D (CALCIUM + D PO) Take 1 tablet by mouth 2 (two) times daily.    [provider]  dabigatran (PRADAXA) 75 MG CAPS capsule Take 1 capsule (75 mg total) by mouth every 12 (twelve) hours. 02/21/22   Croitoru, Mihai, MD  famotidine (PEPCID) 20 MG tablet Take 1 tablet (20 mg total) by mouth daily. 02/11/21 10/12/21  Barb Merino, MD  fluorometholone (FML) 0.1 % ophthalmic suspension Place 1 drop into both eyes 2 (two) times daily.    [provider]  fluticasone (FLONASE) 50 MCG/ACT nasal spray Place 1 spray into both nostrils daily as needed for allergies or rhinitis.    [provider]  furosemide (LASIX) 40 MG tablet TAKE 1 TABLET(40 MG) BY MOUTH DAILY 02/04/22   Croitoru, Mihai, MD  isosorbide mononitrate (IMDUR) 30 MG 24 hr tablet TAKE 1 TABLET(30 MG) BY MOUTH DAILY 12/31/21   Croitoru, Mihai, MD  ketoconazole (NIZORAL) 2 % cream 1 application daily as needed for irritation. 11/29/19   [provider]  magnesium oxide (MAG-OX) 400 (241.3 Mg) MG tablet Take 1 tablet (400 mg total) by mouth  daily. 08/18/17   Croitoru, Mihai, MD  meclizine (ANTIVERT) 12.5 MG tablet Take 1 tablet (12.5 mg total) by mouth 3 (three) times daily as needed for dizziness. 10/14/21   Pokhrel, Corrie Mckusick, MD  metoprolol succinate (TOPROL-XL) 50 MG 24 hr tablet TAKE 1 TABLET BY MOUTH TWICE DAILY. TAKE WITH OR IMMEDIATELY FOLLOWING A MEAL 06/18/21   Croitoru, Mihai, MD  Multiple Vitamin (MULTIVITAMIN WITH MINERALS) TABS tablet Take 1 tablet by mouth daily.    [provider]  Multiple Vitamins-Minerals (PRESERVISION AREDS 2) CAPS Take 1 capsule by mouth 2 (two) times daily.     [provider]  nitroGLYCERIN (NITROSTAT) 0.4 MG SL tablet Place 1 tablet (0.4 mg total) under the tongue every 5 (five) minutes as needed for chest pain. DO NOT TAKE MORE THAN 3 DOSES WITHIN 15 MINS 01/08/21   Croitoru, Mihai, MD  Omega-3 Fatty Acids (FISH OIL) 1000 MG CAPS Take 1,000 mg by mouth daily.    [provider]  omeprazole (PRILOSEC) 40 MG capsule Take 40 mg by mouth daily. 03/26/21   [provider]  Polyethyl Glycol-Propyl Glycol (SYSTANE OP) Place 2 drops into both eyes 2 (two) times daily as needed (for dry eyes).    [provider]  potassium chloride (KLOR-CON) 10 MEQ tablet TAKE 1 TABLET(10 MEQ) BY MOUTH DAILY Patient taking differently: Take 10 mEq by mouth daily. TAKE 1 TABLET(10 MEQ) BY MOUTH DAILY 12/08/20   Croitoru,  Mihai, MD  rosuvastatin (CRESTOR) 5 MG tablet Take 5 mg by mouth daily. 04/04/21   [provider]      Allergies    Tramadol, Protonix [pantoprazole sodium], and Pantoprazole    Review of Systems   Review of Systems  Neurological:  Positive for headaches.    Physical Exam Updated Vital Signs BP (!) 167/108   Pulse 70   Resp 14   Ht '5\' 3"'$  (1.6 m)   Wt 55.3 kg   SpO2 94%   BMI 21.60 kg/m  Physical Exam Vitals and nursing note reviewed.  Constitutional:      Appearance: She is well-developed.  HENT:     Head: Normocephalic and atraumatic.   Eyes:     General: Visual field deficit (baseline right eye blindness) present.  Cardiovascular:     Rate and Rhythm: Normal rate and regular rhythm.  Pulmonary:     Effort: No respiratory distress.     Breath sounds: No stridor.  Abdominal:     General: There is no distension.  Musculoskeletal:        General: No swelling or tenderness.     Cervical back: Normal range of motion.  Skin:    General: Skin is warm and dry.  Neurological:     Mental Status: She is alert. Mental status is at baseline.     GCS: GCS eye subscore is 4. GCS verbal subscore is 5. GCS motor subscore is 6.     Cranial Nerves: No cranial nerve deficit, dysarthria or facial asymmetry.     Sensory: No sensory deficit.     Motor: No weakness.  Psychiatric:        Mood and Affect: Mood normal.     ED Results / Procedures / Treatments   Labs (all labs ordered are listed, but only abnormal results are displayed) Labs Reviewed  CBC WITH DIFFERENTIAL/PLATELET - Abnormal; Notable for the following components:      Result Value   Platelets 148 (*)    All other components within normal limits  COMPREHENSIVE METABOLIC PANEL - Abnormal; Notable for the following components:   Glucose, Bld 132 (*)    Total Protein 5.9 (*)    Albumin 3.4 (*)    GFR, Estimated 53 (*)    All other components within normal limits  URINALYSIS, ROUTINE W REFLEX MICROSCOPIC    EKG EKG Interpretation  Date/Time:  Sunday February 24 2022 00:32:58 EDT Ventricular Rate:  70 PR Interval:    QRS Duration: 156 QT Interval:  467 QTC Calculation: 504 R Axis:   -85 Text Interpretation: Accelerated junctional rhythm IVCD, consider atypical RBBB LVH with IVCD and secondary repol abnrm No significant change was found Confirmed by Ezequiel Essex [phone]) on 02/24/2022 7:09:27 AM  Radiology CT ANGIO HEAD NECK W WO CM  Result Date: 02/24/2022 CLINICAL DATA:  Dizziness EXAM: CT ANGIOGRAPHY HEAD AND NECK TECHNIQUE: Multidetector CT imaging  of the head and neck was performed using the standard protocol during bolus administration of intravenous contrast. Multiplanar CT image reconstructions and MIPs were obtained to evaluate the vascular anatomy. Carotid stenosis measurements (when applicable) are obtained utilizing NASCET criteria, using the distal internal carotid diameter as the denominator. RADIATION DOSE REDUCTION: This exam was performed according to the departmental dose-optimization program which includes automated exposure control, adjustment of the mA and/or kV according to patient size and/or use of iterative reconstruction technique. CONTRAST:  37m OMNIPAQUE IOHEXOL 350 MG/ML SOLN COMPARISON:  None Available. FINDINGS: CT HEAD FINDINGS  Brain: There is no mass, hemorrhage or extra-axial collection. There is generalized atrophy without lobar predilection. There is hypoattenuation of the periventricular white matter, most commonly indicating chronic ischemic microangiopathy. Skull: The visualized skull base, calvarium and extracranial soft tissues are normal. Sinuses/Orbits: No fluid levels or advanced mucosal thickening of the visualized paranasal sinuses. No mastoid or middle ear effusion. The orbits are normal. CTA NECK FINDINGS SKELETON: There is no bony spinal canal stenosis. No lytic or blastic lesion. OTHER NECK: Normal pharynx, larynx and major salivary glands. No cervical lymphadenopathy. Unremarkable thyroid gland. UPPER CHEST: Small right pleural effusion AORTIC ARCH: There is calcific atherosclerosis of the aortic arch. There is no aneurysm, dissection or hemodynamically significant stenosis of the visualized portion of the aorta. Conventional 3 vessel aortic branching pattern. The visualized proximal subclavian arteries are widely patent. RIGHT CAROTID SYSTEM: No dissection, occlusion or aneurysm. Mild atherosclerotic calcification at the carotid bifurcation without hemodynamically significant stenosis. LEFT CAROTID SYSTEM: No  dissection, occlusion or aneurysm. There is calcified atherosclerosis extending into the proximal ICA, resulting in less than 50% stenosis. VERTEBRAL ARTERIES: Left dominant configuration. Both origins are clearly patent. There is no dissection, occlusion or flow-limiting stenosis to the skull base (V1-V3 segments). CTA HEAD FINDINGS POSTERIOR CIRCULATION: --Vertebral arteries: Normal V4 segments. --Inferior cerebellar arteries: Normal. --Basilar artery: Normal. --Superior cerebellar arteries: Normal. --Posterior cerebral arteries (PCA): Mild multifocal narrowing of the left P1 segment. ANTERIOR CIRCULATION: --Intracranial internal carotid arteries: Atherosclerotic calcification of the internal carotid arteries at the skull base without hemodynamically significant stenosis. --Anterior cerebral arteries (ACA): Normal. Both A1 segments are present. Patent anterior communicating artery (a-comm). --Middle cerebral arteries (MCA): Normal. VENOUS SINUSES: As permitted by contrast timing, patent. ANATOMIC VARIANTS: None Review of the MIP images confirms the above findings. IMPRESSION: 1. No emergent large vessel occlusion or hemodynamically significant stenosis of the head or neck. 2. Bilateral carotid bifurcation atherosclerosis without hemodynamically significant stenosis. 3. Small right pleural effusion. Aortic atherosclerosis (ICD10-I70.0). Electronically Signed   By: Ulyses Jarred M.D.   On: 02/24/2022 03:38    Procedures Procedures    Medications Ordered in ED Medications  hydrALAZINE (APRESOLINE) injection 5 mg (has no administration in time range)  isosorbide mononitrate (IMDUR) 24 hr tablet 30 mg (has no administration in time range)  metoprolol succinate (TOPROL-XL) 24 hr tablet 50 mg (has no administration in time range)  prochlorperazine (COMPAZINE) injection 5 mg (5 mg Intravenous Given 02/24/22 0132)  dexamethasone (DECADRON) injection 10 mg (10 mg Intravenous Given 02/24/22 0129)  iohexol  (OMNIPAQUE) 350 MG/ML injection 75 mL (75 mLs Intravenous Contrast Given 02/24/22 0315)  metoprolol tartrate (LOPRESSOR) injection 5 mg (5 mg Intravenous Given 02/24/22 0522)    ED Course/ Medical Decision Making/ A&P                           Medical Decision Making Amount and/or Complexity of Data Reviewed Independent Historian:     Details: Family at bedside Labs: ordered.    Details: Cbc and cmp reassuring Radiology: ordered and independent interpretation performed.    Details: CTA without obvious bleed ECG/medicine tests: ordered and independent interpretation performed.  Risk OTC drugs. Prescription drug management. Parenteral controlled substances. Decision regarding hospitalization. Diagnosis or treatment significantly limited by social determinants of health.  Will eval for bleeda dn treat headache and reasess for BP.   Reassessment and BP still elevated. Workup reassuring. Will give IV metop as she is on metop at home.  HA improved. Had a time when her BP came down but went back up. HA improved but not dissipated. BP still > 789 systolic. IV hydralazine and home meds ordered. Will reassess afterwards to see how shes doing.   Care transferred pending reeval.   Final Clinical Impression(s) / ED Diagnoses Final diagnoses:  Nonintractable headache, unspecified chronicity pattern, unspecified headache type  Hypertension, unspecified type    Rx / DC Orders ED Discharge Orders     None         Pasty Manninen, Corene Cornea, MD 02/24/22 [phone]

## 2022-02-24 NOTE — ED Provider Notes (Signed)
Care assumed from Dr. Dayna Barker.  Patient here with elevated blood pressure and right occipital headache onset last evening.  CTA was negative for hemorrhage or aneurysm.  Neurological exam was nonfocal.  She is given her home blood pressure medications and needs to be reassessed for improvement.  Patient complains of returning headache to her right occiput.  She appears uncomfortable.  Blood pressure has improved to 022/33 systolic. She complains of shortness of breath but states this is chronic for her.  Lungs are clear, no hypoxia on room air.  We will obtain chest x-ray.  She denies chest pain.  Has a known history of both thoracic and abdominal aortic aneurysm of which she has declined surgical repair previously.  Still has recurrent headache to her right occiput.  She was given multiple blood pressure medications prior to my assumption of her care and her blood pressure is now in the 61Q systolic.  CTA was negative for aneurysm or hemorrhage.  Consider nonaneurysmal subarachnoid hemorrhage but this seems less likely.  Patient states headache was gradual in onset not thunderclap.  Patient on Pradaxa, unable to perform lumbar puncture.  Unable to have MRI due to her pacemaker.  Headache improving.  Blood pressure normalized to 129/61.  Troponin negative x2, EKG is paced and unchanged.  Chest x-ray shows stable cardiomegaly without pulmonary edema.  Patient feels her breathing is at baseline.  Sed rate is normal.  Low suspicion for temporal arteritis.  No change to her chronic poor vision.  Has improved.  Patient tolerating p.o. and ambulatory.  Denies any dizziness.  Discussed follow-up with her PCP for further medication adjustments and to monitor her blood pressure closely. Return precautions discussed.   Ezequiel Essex, MD 02/24/22 1301

## 2022-02-24 NOTE — ED Notes (Signed)
Pt provided written and verbal d/c instructions. Pt verbalizes understanding. PT family at ER entrance to take pt home via POV. Pt left dept via wheelchair with belongings and d/c papers.

## 2022-02-24 NOTE — ED Triage Notes (Signed)
The  pt arrived by gems from home  she has had a severe headache  since 2000 sudden anset  she has a history of migraine headaches   but has not had one for years  ems gave an iv and also gave fentanyl 165mgs iv on the way here  on arrival headache is better  moves all extremities

## 2022-03-06 ENCOUNTER — Telehealth: Payer: Self-pay

## 2022-03-06 NOTE — Telephone Encounter (Signed)
     Patient  visit on 10/15  at [facility]  Have you been able to follow up with your primary care physician? yes  The patient was or was not able to obtain any needed medicine or equipment. yes  Are there diet recommendations that you are having difficulty following? na  Patient expresses understanding of discharge instructions and education provided has no other needs at this time. yes     [place], Care Management  [phone] [address], [place], [place] [address] Phone: [phone] Email: [email or web address]

## 2022-03-11 DIAGNOSIS — E782 Mixed hyperlipidemia: Secondary | ICD-10-CM | POA: Diagnosis not present

## 2022-03-11 DIAGNOSIS — I129 Hypertensive chronic kidney disease with stage 1 through stage 4 chronic kidney disease, or unspecified chronic kidney disease: Secondary | ICD-10-CM | POA: Diagnosis not present

## 2022-03-11 DIAGNOSIS — I480 Paroxysmal atrial fibrillation: Secondary | ICD-10-CM | POA: Diagnosis not present

## 2022-03-11 DIAGNOSIS — R600 Localized edema: Secondary | ICD-10-CM | POA: Diagnosis not present

## 2022-03-11 DIAGNOSIS — I712 Thoracic aortic aneurysm, without rupture, unspecified: Secondary | ICD-10-CM | POA: Diagnosis not present

## 2022-03-11 DIAGNOSIS — N1831 Chronic kidney disease, stage 3a: Secondary | ICD-10-CM | POA: Diagnosis not present

## 2022-03-12 DIAGNOSIS — I129 Hypertensive chronic kidney disease with stage 1 through stage 4 chronic kidney disease, or unspecified chronic kidney disease: Secondary | ICD-10-CM | POA: Diagnosis not present

## 2022-03-12 DIAGNOSIS — M810 Age-related osteoporosis without current pathological fracture: Secondary | ICD-10-CM | POA: Diagnosis not present

## 2022-03-12 DIAGNOSIS — I480 Paroxysmal atrial fibrillation: Secondary | ICD-10-CM | POA: Diagnosis not present

## 2022-03-20 ENCOUNTER — Telehealth: Payer: Self-pay

## 2022-03-20 DIAGNOSIS — I5032 Chronic diastolic (congestive) heart failure: Secondary | ICD-10-CM

## 2022-03-20 DIAGNOSIS — I4729 Other ventricular tachycardia: Secondary | ICD-10-CM

## 2022-03-20 NOTE — Telephone Encounter (Addendum)
Received the following remote transmission alert from [employer]:    BS alert for NSVT, 35 beats on 03/18/22 @ 0618. [place]- Pradaxa. Routing for further review- JJB  Spoke with patient.  Only thing to note at that time for her is that she became very upset and anxious early am on Monday when her caregivers did not show up and there was confusion on her help for the day.   She does note the following med changes which occurred at her ER visit (02/24/22) for elevated bp and HA:  (per patients report), she says the hospital called her after she was released home with the following:  NONE OF THESE CHANGES ARE ON HER MED RECORD: But did confirm noted on Dr. Mathis Fare current med list for her at [facility] on 03/11/22. Will update our records now. Patient to see Dr. Ashby Dawes at the end of November to re-eval med changes.  Patient started on diltiazem '120mg'$  1 daily for 30 days Lasix was increased from '40mg'$  qd to '60mg'$  daily for 30 days  Potassium was stopped   Buspar started at 0.'5mg'$  bid for 30 days *I did spend 20 minutes on phone with her and her caregiver confirming her med list.  Per patient the above is correct in addition to the rest of medications that are listed in our med list.   States Dr. Ashby Dawes is following her Lasix increase and will see her at the end of the month.   Forwarding to Dr. Sallyanne Kuster for any recommendations.

## 2022-03-21 ENCOUNTER — Other Ambulatory Visit: Payer: Self-pay | Admitting: *Deleted

## 2022-03-21 DIAGNOSIS — I4729 Other ventricular tachycardia: Secondary | ICD-10-CM

## 2022-03-21 DIAGNOSIS — I5032 Chronic diastolic (congestive) heart failure: Secondary | ICD-10-CM

## 2022-03-21 LAB — BASIC METABOLIC PANEL
BUN/Creatinine Ratio: 20 (ref 12–28)
BUN: 17 mg/dL (ref 10–36)
CO2: 28 mmol/L (ref 20–29)
Calcium: 9.3 mg/dL (ref 8.7–10.3)
Chloride: 101 mmol/L (ref 96–106)
Creatinine, Ser: 0.87 mg/dL (ref 0.57–1.00)
Glucose: 104 mg/dL — ABNORMAL HIGH (ref 70–99)
Potassium: 4.7 mmol/L (ref 3.5–5.2)
Sodium: 145 mmol/L — ABNORMAL HIGH (ref 134–144)
eGFR: 62 mL/min/{1.73_m2} (ref >59)

## 2022-03-21 MED ORDER — DABIGATRAN ETEXILATE MESYLATE 75 MG PO CAPS: 75.0000 mg | ORAL_CAPSULE | Freq: Two times a day (BID) | ORAL | 1 refills | Status: DC

## 2022-03-21 MED ORDER — POTASSIUM CHLORIDE ER 20 MEQ PO TBCR: 20.0000 meq | EXTENDED_RELEASE_TABLET | Freq: Every day | ORAL | 3 refills | Status: DC

## 2022-03-21 NOTE — Telephone Encounter (Signed)
The episode of VT may be due to hypokalemia.  This may be occurring since she was told to stop her potassium when they increased her diuretic.  Please restart the potassium chloride supplement 20 mEq daily and have her check labs today (BMET, BNP).

## 2022-03-21 NOTE — Telephone Encounter (Signed)
Spoke to patient and caregiver-will come to [facility] lab to have labs today.  Rx sent to pharmacy.  Patient also states [facility] needs approval from our office to refill Pradaxa. Called [facility]-need new rx.  Rx sent to pharmacy.

## 2022-03-23 LAB — BRAIN NATRIURETIC PEPTIDE: BNP: 646.1 pg/mL — ABNORMAL HIGH (ref 0.0–100.0)

## 2022-03-24 ENCOUNTER — Other Ambulatory Visit: Payer: Self-pay | Admitting: Cardiovascular Disease

## 2022-04-01 DIAGNOSIS — I10 Essential (primary) hypertension: Secondary | ICD-10-CM | POA: Diagnosis not present

## 2022-04-01 DIAGNOSIS — R600 Localized edema: Secondary | ICD-10-CM | POA: Diagnosis not present

## 2022-04-01 DIAGNOSIS — I129 Hypertensive chronic kidney disease with stage 1 through stage 4 chronic kidney disease, or unspecified chronic kidney disease: Secondary | ICD-10-CM | POA: Diagnosis not present

## 2022-04-01 DIAGNOSIS — E782 Mixed hyperlipidemia: Secondary | ICD-10-CM | POA: Diagnosis not present

## 2022-04-01 DIAGNOSIS — H31092 Other chorioretinal scars, left eye: Secondary | ICD-10-CM | POA: Diagnosis not present

## 2022-04-01 DIAGNOSIS — H353122 Nonexudative age-related macular degeneration, left eye, intermediate dry stage: Secondary | ICD-10-CM | POA: Diagnosis not present

## 2022-04-01 DIAGNOSIS — Z961 Presence of intraocular lens: Secondary | ICD-10-CM | POA: Diagnosis not present

## 2022-04-01 DIAGNOSIS — N1831 Chronic kidney disease, stage 3a: Secondary | ICD-10-CM | POA: Diagnosis not present

## 2022-04-01 DIAGNOSIS — I480 Paroxysmal atrial fibrillation: Secondary | ICD-10-CM | POA: Diagnosis not present

## 2022-04-01 DIAGNOSIS — R5383 Other fatigue: Secondary | ICD-10-CM | POA: Diagnosis not present

## 2022-04-08 DIAGNOSIS — N1831 Chronic kidney disease, stage 3a: Secondary | ICD-10-CM | POA: Diagnosis not present

## 2022-04-08 DIAGNOSIS — I7102 Dissection of abdominal aorta: Secondary | ICD-10-CM | POA: Diagnosis not present

## 2022-04-08 DIAGNOSIS — I130 Hypertensive heart and chronic kidney disease with heart failure and stage 1 through stage 4 chronic kidney disease, or unspecified chronic kidney disease: Secondary | ICD-10-CM | POA: Diagnosis not present

## 2022-04-08 DIAGNOSIS — J432 Centrilobular emphysema: Secondary | ICD-10-CM | POA: Diagnosis not present

## 2022-04-08 DIAGNOSIS — I700 Atherosclerosis of aorta: Secondary | ICD-10-CM | POA: Diagnosis not present

## 2022-04-08 DIAGNOSIS — I714 Abdominal aortic aneurysm, without rupture, unspecified: Secondary | ICD-10-CM | POA: Diagnosis not present

## 2022-04-08 DIAGNOSIS — Z23 Encounter for immunization: Secondary | ICD-10-CM | POA: Diagnosis not present

## 2022-04-08 DIAGNOSIS — E782 Mixed hyperlipidemia: Secondary | ICD-10-CM | POA: Diagnosis not present

## 2022-04-08 DIAGNOSIS — I4821 Permanent atrial fibrillation: Secondary | ICD-10-CM | POA: Diagnosis not present

## 2022-04-08 DIAGNOSIS — Z0000 Encounter for general adult medical examination without abnormal findings: Secondary | ICD-10-CM | POA: Diagnosis not present

## 2022-04-08 DIAGNOSIS — I5030 Unspecified diastolic (congestive) heart failure: Secondary | ICD-10-CM | POA: Diagnosis not present

## 2022-04-08 DIAGNOSIS — I712 Thoracic aortic aneurysm, without rupture, unspecified: Secondary | ICD-10-CM | POA: Diagnosis not present

## 2022-04-17 ENCOUNTER — Telehealth: Payer: Self-pay | Admitting: Cardiovascular Disease

## 2022-04-17 DIAGNOSIS — Z23 Encounter for immunization: Secondary | ICD-10-CM | POA: Diagnosis not present

## 2022-04-17 NOTE — Telephone Encounter (Signed)
Pt c/o medication issue:  1. Name of Medication:   furosemide (LASIX) 40 MG tablet  isosorbide mononitrate (IMDUR) 30 MG 24 hr tablet   2. How are you currently taking this medication (dosage and times per day)?   3. Are you having a reaction (difficulty breathing--STAT)? No  4. What is your medication issue? Pt states that she has not been taking prescribed dosage of above medications. She would like a callback regarding this matter. Please advise

## 2022-04-17 NOTE — Telephone Encounter (Signed)
Called patient, she states her recent refill on imdur was 0.5 tablet daily (15 mg), I was unaware of where this was changed, previous RX states 1 tablet (30 mg), is this the correct dosage? She was unaware it changed.   Also, she was advised to increase her Lasix to 60 mg daily- however, RX states 40 mg, should she continue 60 mg daily or 40?   Can you advise?  Thanks!

## 2022-04-17 NOTE — Telephone Encounter (Signed)
Please refill the isosorbide mononitrate as 30 mg daily and the furosemide as 40 mg daily ( I believe there was only a temporary increase to 60 mg daily).

## 2022-04-18 MED ORDER — FUROSEMIDE 40 MG PO TABS: ORAL_TABLET | ORAL | 3 refills | Status: DC

## 2022-04-18 MED ORDER — ISOSORBIDE MONONITRATE ER 30 MG PO TB24: 30.0000 mg | ORAL_TABLET | Freq: Every day | ORAL | 3 refills | Status: DC

## 2022-04-18 NOTE — Telephone Encounter (Signed)
Spoke to patient Dr.Croitoru advised to take Isosorbide 30 mg daily and Lasix 40 mg daily.Prescriptions sent to he pharmacy.

## 2022-05-01 ENCOUNTER — Ambulatory Visit (INDEPENDENT_AMBULATORY_CARE_PROVIDER_SITE_OTHER): Payer: Medicare Other

## 2022-05-01 DIAGNOSIS — I442 Atrioventricular block, complete: Secondary | ICD-10-CM

## 2022-05-01 LAB — CUP PACEART REMOTE DEVICE CHECK
Battery Remaining Longevity: 90 mo
Battery Remaining Percentage: 100 %
Brady Statistic RA Percent Paced: 0 %
Brady Statistic RV Percent Paced: 99 %
Date Time Interrogation Session: 20231220014100
Implantable Lead Connection Status: 753985
Implantable Lead Connection Status: 753985
Implantable Lead Implant Date: 20131211
Implantable Lead Implant Date: 20131211
Implantable Lead Location: 753859
Implantable Lead Location: 753860
Implantable Lead Model: 4135
Implantable Lead Model: 4136
Implantable Lead Serial Number: 29296655
Implantable Lead Serial Number: 29317536
Implantable Pulse Generator Implant Date: 20221205
Lead Channel Impedance Value: 575 Ohm
Lead Channel Impedance Value: 609 Ohm
Lead Channel Setting Pacing Amplitude: 3 V
Lead Channel Setting Pacing Pulse Width: 0.4 ms
Lead Channel Setting Sensing Sensitivity: 3.5 mV
Pulse Gen Serial Number: 646589
Zone Setting Status: 755011

## 2022-05-08 ENCOUNTER — Encounter (HOSPITAL_COMMUNITY): Payer: Self-pay

## 2022-05-08 ENCOUNTER — Inpatient Hospital Stay (HOSPITAL_COMMUNITY)
Admission: EM | Admit: 2022-05-08 | Discharge: 2022-05-10 | DRG: 194 | Disposition: A | Discharge status: Home / Self Care | Payer: Medicare Other | Attending: Internal Medicine | Admitting: Internal Medicine

## 2022-05-08 ENCOUNTER — Other Ambulatory Visit: Payer: Self-pay

## 2022-05-08 ENCOUNTER — Emergency Department (HOSPITAL_COMMUNITY): Payer: Medicare Other

## 2022-05-08 DIAGNOSIS — J101 Influenza due to other identified influenza virus with other respiratory manifestations: Principal | ICD-10-CM | POA: Diagnosis present

## 2022-05-08 DIAGNOSIS — I639 Cerebral infarction, unspecified: Secondary | ICD-10-CM | POA: Diagnosis present

## 2022-05-08 DIAGNOSIS — I442 Atrioventricular block, complete: Secondary | ICD-10-CM | POA: Diagnosis present

## 2022-05-08 DIAGNOSIS — J111 Influenza due to unidentified influenza virus with other respiratory manifestations: Secondary | ICD-10-CM | POA: Diagnosis not present

## 2022-05-08 DIAGNOSIS — Z8042 Family history of malignant neoplasm of prostate: Secondary | ICD-10-CM | POA: Diagnosis not present

## 2022-05-08 DIAGNOSIS — Z86010 Personal history of colonic polyps: Secondary | ICD-10-CM | POA: Diagnosis not present

## 2022-05-08 DIAGNOSIS — I4819 Other persistent atrial fibrillation: Secondary | ICD-10-CM | POA: Diagnosis present

## 2022-05-08 DIAGNOSIS — I351 Nonrheumatic aortic (valve) insufficiency: Secondary | ICD-10-CM | POA: Diagnosis present

## 2022-05-08 DIAGNOSIS — I350 Nonrheumatic aortic (valve) stenosis: Secondary | ICD-10-CM | POA: Diagnosis present

## 2022-05-08 DIAGNOSIS — E7800 Pure hypercholesterolemia, unspecified: Secondary | ICD-10-CM | POA: Diagnosis present

## 2022-05-08 DIAGNOSIS — Z8249 Family history of ischemic heart disease and other diseases of the circulatory system: Secondary | ICD-10-CM | POA: Diagnosis not present

## 2022-05-08 DIAGNOSIS — J209 Acute bronchitis, unspecified: Secondary | ICD-10-CM | POA: Diagnosis not present

## 2022-05-08 DIAGNOSIS — I5032 Chronic diastolic (congestive) heart failure: Secondary | ICD-10-CM | POA: Diagnosis not present

## 2022-05-08 DIAGNOSIS — Z888 Allergy status to other drugs, medicaments and biological substances status: Secondary | ICD-10-CM

## 2022-05-08 DIAGNOSIS — I714 Abdominal aortic aneurysm, without rupture, unspecified: Secondary | ICD-10-CM | POA: Diagnosis present

## 2022-05-08 DIAGNOSIS — Z1152 Encounter for screening for COVID-19: Secondary | ICD-10-CM

## 2022-05-08 DIAGNOSIS — Z885 Allergy status to narcotic agent status: Secondary | ICD-10-CM

## 2022-05-08 DIAGNOSIS — I2510 Atherosclerotic heart disease of native coronary artery without angina pectoris: Secondary | ICD-10-CM | POA: Diagnosis present

## 2022-05-08 DIAGNOSIS — Z79899 Other long term (current) drug therapy: Secondary | ICD-10-CM

## 2022-05-08 DIAGNOSIS — J441 Chronic obstructive pulmonary disease with (acute) exacerbation: Secondary | ICD-10-CM | POA: Diagnosis present

## 2022-05-08 DIAGNOSIS — R1111 Vomiting without nausea: Secondary | ICD-10-CM | POA: Diagnosis not present

## 2022-05-08 DIAGNOSIS — R0603 Acute respiratory distress: Secondary | ICD-10-CM | POA: Diagnosis present

## 2022-05-08 DIAGNOSIS — Z950 Presence of cardiac pacemaker: Secondary | ICD-10-CM | POA: Diagnosis not present

## 2022-05-08 DIAGNOSIS — Z87891 Personal history of nicotine dependence: Secondary | ICD-10-CM

## 2022-05-08 DIAGNOSIS — I10 Essential (primary) hypertension: Secondary | ICD-10-CM | POA: Diagnosis present

## 2022-05-08 DIAGNOSIS — I495 Sick sinus syndrome: Secondary | ICD-10-CM | POA: Diagnosis present

## 2022-05-08 DIAGNOSIS — R509 Fever, unspecified: Secondary | ICD-10-CM | POA: Diagnosis not present

## 2022-05-08 DIAGNOSIS — Z8261 Family history of arthritis: Secondary | ICD-10-CM

## 2022-05-08 DIAGNOSIS — I110 Hypertensive heart disease with heart failure: Secondary | ICD-10-CM | POA: Diagnosis present

## 2022-05-08 DIAGNOSIS — R0600 Dyspnea, unspecified: Secondary | ICD-10-CM | POA: Diagnosis not present

## 2022-05-08 DIAGNOSIS — J9801 Acute bronchospasm: Secondary | ICD-10-CM | POA: Diagnosis not present

## 2022-05-08 DIAGNOSIS — Z7901 Long term (current) use of anticoagulants: Secondary | ICD-10-CM

## 2022-05-08 DIAGNOSIS — K219 Gastro-esophageal reflux disease without esophagitis: Secondary | ICD-10-CM | POA: Diagnosis present

## 2022-05-08 DIAGNOSIS — R059 Cough, unspecified: Secondary | ICD-10-CM | POA: Diagnosis not present

## 2022-05-08 DIAGNOSIS — Z8673 Personal history of transient ischemic attack (TIA), and cerebral infarction without residual deficits: Secondary | ICD-10-CM

## 2022-05-08 DIAGNOSIS — R0602 Shortness of breath: Secondary | ICD-10-CM | POA: Diagnosis not present

## 2022-05-08 LAB — CBC
HCT: 48 % — ABNORMAL HIGH (ref 36.0–46.0)
Hemoglobin: 15 g/dL (ref 12.0–15.0)
MCH: 30.4 pg (ref 26.0–34.0)
MCHC: 31.3 g/dL (ref 30.0–36.0)
MCV: 97.4 fL (ref 80.0–100.0)
Platelets: 147 10*3/uL — ABNORMAL LOW (ref 150–400)
RBC: 4.93 MIL/uL (ref 3.87–5.11)
RDW: 14.7 % (ref 11.5–15.5)
WBC: 8.1 10*3/uL (ref 4.0–10.5)
nRBC: 0 % (ref 0.0–0.2)

## 2022-05-08 LAB — COMPREHENSIVE METABOLIC PANEL
ALT: 19 U/L (ref 0–44)
AST: 39 U/L (ref 15–41)
Albumin: 3.3 g/dL — ABNORMAL LOW (ref 3.5–5.0)
Alkaline Phosphatase: 93 U/L (ref 38–126)
Anion gap: 14 (ref 5–15)
BUN: 23 mg/dL (ref 8–23)
CO2: 16 mmol/L — ABNORMAL LOW (ref 22–32)
Calcium: 7.9 mg/dL — ABNORMAL LOW (ref 8.9–10.3)
Chloride: 111 mmol/L (ref 98–111)
Creatinine, Ser: 0.81 mg/dL (ref 0.44–1.00)
GFR, Estimated: >60 mL/min (ref >60)
Glucose, Bld: 121 mg/dL — ABNORMAL HIGH (ref 70–99)
Potassium: 3.7 mmol/L (ref 3.5–5.1)
Sodium: 141 mmol/L (ref 135–145)
Total Bilirubin: 1.5 mg/dL — ABNORMAL HIGH (ref 0.3–1.2)
Total Protein: 5.6 g/dL — ABNORMAL LOW (ref 6.5–8.1)

## 2022-05-08 LAB — TROPONIN I (HIGH SENSITIVITY): Troponin I (High Sensitivity): 17 ng/L (ref <18)

## 2022-05-08 LAB — RESP PANEL BY RT-PCR (RSV, FLU A&B, COVID)  RVPGX2
Influenza A by PCR: POSITIVE — AB
Influenza B by PCR: NEGATIVE
Resp Syncytial Virus by PCR: NEGATIVE
SARS Coronavirus 2 by RT PCR: NEGATIVE

## 2022-05-08 LAB — BRAIN NATRIURETIC PEPTIDE: B Natriuretic Peptide: 838 pg/mL — ABNORMAL HIGH (ref 0.0–100.0)

## 2022-05-08 MED ORDER — FLUTICASONE PROPIONATE 50 MCG/ACT NA SUSP: 1.0000 | Freq: Every day | NASAL | Status: DC | PRN

## 2022-05-08 MED ORDER — GUAIFENESIN ER 600 MG PO TB12
600.0000 mg | ORAL_TABLET | Freq: Two times a day (BID) | ORAL | Status: DC
  Administered 2022-05-08 – 2022-05-10 (×5): 600 mg via ORAL
  Filled 2022-05-08 (×5): qty 1

## 2022-05-08 MED ORDER — MAGNESIUM OXIDE -MG SUPPLEMENT 400 (240 MG) MG PO TABS
400.0000 mg | ORAL_TABLET | Freq: Every day | ORAL | Status: DC
  Administered 2022-05-08 – 2022-05-10 (×3): 400 mg via ORAL
  Filled 2022-05-08 (×3): qty 1

## 2022-05-08 MED ORDER — ALBUTEROL SULFATE (2.5 MG/3ML) 0.083% IN NEBU: 2.5000 mg | INHALATION_SOLUTION | RESPIRATORY_TRACT | Status: DC | PRN

## 2022-05-08 MED ORDER — CALCIUM-VITAMIN D-VITAMIN K 500-1000-40 MG-UNT-MCG PO CHEW: CHEWABLE_TABLET | Freq: Two times a day (BID) | ORAL | Status: DC

## 2022-05-08 MED ORDER — BISACODYL 5 MG PO TBEC: 5.0000 mg | DELAYED_RELEASE_TABLET | Freq: Every day | ORAL | Status: DC | PRN

## 2022-05-08 MED ORDER — DABIGATRAN ETEXILATE MESYLATE 75 MG PO CAPS
75.0000 mg | ORAL_CAPSULE | Freq: Two times a day (BID) | ORAL | Status: DC
  Administered 2022-05-08 – 2022-05-10 (×4): 75 mg via ORAL
  Filled 2022-05-08 (×5): qty 1

## 2022-05-08 MED ORDER — OSELTAMIVIR PHOSPHATE 75 MG PO CAPS: 75.0000 mg | ORAL_CAPSULE | Freq: Two times a day (BID) | ORAL | Status: DC

## 2022-05-08 MED ORDER — ENOXAPARIN SODIUM 40 MG/0.4ML IJ SOSY: 40.0000 mg | PREFILLED_SYRINGE | Freq: Every day | INTRAMUSCULAR | Status: DC

## 2022-05-08 MED ORDER — DORZOLAMIDE HCL 2 % OP SOLN
1.0000 [drp] | Freq: Two times a day (BID) | OPHTHALMIC | Status: DC
  Administered 2022-05-08 – 2022-05-10 (×4): 1 [drp] via OPHTHALMIC
  Filled 2022-05-08: qty 10

## 2022-05-08 MED ORDER — IPRATROPIUM-ALBUTEROL 0.5-2.5 (3) MG/3ML IN SOLN
3.0000 mL | Freq: Once | RESPIRATORY_TRACT | Status: AC
  Administered 2022-05-08: 3 mL via RESPIRATORY_TRACT
  Filled 2022-05-08: qty 3

## 2022-05-08 MED ORDER — ACETAMINOPHEN 325 MG PO TABS: 650.0000 mg | ORAL_TABLET | Freq: Four times a day (QID) | ORAL | Status: DC | PRN

## 2022-05-08 MED ORDER — ONDANSETRON HCL 4 MG PO TABS: 4.0000 mg | ORAL_TABLET | Freq: Four times a day (QID) | ORAL | Status: DC | PRN

## 2022-05-08 MED ORDER — OSELTAMIVIR PHOSPHATE 30 MG PO CAPS
30.0000 mg | ORAL_CAPSULE | Freq: Two times a day (BID) | ORAL | Status: DC
  Administered 2022-05-08 – 2022-05-10 (×5): 30 mg via ORAL
  Filled 2022-05-08 (×6): qty 1

## 2022-05-08 MED ORDER — ACETAMINOPHEN 650 MG RE SUPP: 650.0000 mg | Freq: Four times a day (QID) | RECTAL | Status: DC | PRN

## 2022-05-08 MED ORDER — SENNOSIDES-DOCUSATE SODIUM 8.6-50 MG PO TABS: 1.0000 | ORAL_TABLET | Freq: Every evening | ORAL | Status: DC | PRN

## 2022-05-08 MED ORDER — OYSTER SHELL CALCIUM/D3 500-5 MG-MCG PO TABS
1.0000 | ORAL_TABLET | Freq: Two times a day (BID) | ORAL | Status: DC
  Administered 2022-05-08 – 2022-05-10 (×4): 1 via ORAL
  Filled 2022-05-08 (×4): qty 1

## 2022-05-08 MED ORDER — ISOSORBIDE MONONITRATE ER 30 MG PO TB24
30.0000 mg | ORAL_TABLET | Freq: Every day | ORAL | Status: DC
  Administered 2022-05-08 – 2022-05-10 (×3): 30 mg via ORAL
  Filled 2022-05-08 (×3): qty 1

## 2022-05-08 MED ORDER — PREDNISONE 20 MG PO TABS
40.0000 mg | ORAL_TABLET | Freq: Once | ORAL | Status: AC
  Administered 2022-05-08: 40 mg via ORAL
  Filled 2022-05-08: qty 2

## 2022-05-08 MED ORDER — POLYETHYL GLYCOL-PROPYL GLYCOL 0.4-0.3 % OP GEL: Freq: Two times a day (BID) | OPHTHALMIC | Status: DC | PRN

## 2022-05-08 MED ORDER — POTASSIUM CHLORIDE CRYS ER 20 MEQ PO TBCR
20.0000 meq | EXTENDED_RELEASE_TABLET | Freq: Every day | ORAL | Status: DC
  Administered 2022-05-08 – 2022-05-09 (×2): 20 meq via ORAL
  Filled 2022-05-08 (×2): qty 1

## 2022-05-08 MED ORDER — ONDANSETRON HCL 4 MG/2ML IJ SOLN: 4.0000 mg | Freq: Four times a day (QID) | INTRAMUSCULAR | Status: DC | PRN

## 2022-05-08 MED ORDER — ROSUVASTATIN CALCIUM 5 MG PO TABS
5.0000 mg | ORAL_TABLET | Freq: Every day | ORAL | Status: DC
  Administered 2022-05-08 – 2022-05-10 (×3): 5 mg via ORAL
  Filled 2022-05-08 (×3): qty 1

## 2022-05-08 MED ORDER — METOPROLOL SUCCINATE ER 25 MG PO TB24
25.0000 mg | ORAL_TABLET | Freq: Two times a day (BID) | ORAL | Status: DC
  Administered 2022-05-08 – 2022-05-10 (×4): 25 mg via ORAL
  Filled 2022-05-08 (×4): qty 1

## 2022-05-08 MED ORDER — PANTOPRAZOLE SODIUM 40 MG PO TBEC
40.0000 mg | DELAYED_RELEASE_TABLET | Freq: Every day | ORAL | Status: DC
  Administered 2022-05-08 – 2022-05-10 (×3): 40 mg via ORAL
  Filled 2022-05-08 (×3): qty 1

## 2022-05-08 MED ORDER — PREDNISONE 10 MG PO TABS
10.0000 mg | ORAL_TABLET | Freq: Every day | ORAL | Status: DC
  Administered 2022-05-09 – 2022-05-10 (×2): 10 mg via ORAL
  Filled 2022-05-08 (×2): qty 1

## 2022-05-08 MED ORDER — POLYVINYL ALCOHOL 1.4 % OP SOLN: 1.0000 [drp] | OPHTHALMIC | Status: DC | PRN

## 2022-05-08 MED ORDER — POTASSIUM CHLORIDE ER 20 MEQ PO TBCR: 20.0000 meq | EXTENDED_RELEASE_TABLET | Freq: Every day | ORAL | Status: DC

## 2022-05-08 MED ORDER — BUSPIRONE HCL 10 MG PO TABS
5.0000 mg | ORAL_TABLET | Freq: Two times a day (BID) | ORAL | Status: DC
  Administered 2022-05-08 – 2022-05-10 (×4): 5 mg via ORAL
  Filled 2022-05-08 (×4): qty 1

## 2022-05-08 MED ORDER — FUROSEMIDE 40 MG PO TABS
40.0000 mg | ORAL_TABLET | Freq: Every day | ORAL | Status: DC
  Administered 2022-05-09 – 2022-05-10 (×2): 40 mg via ORAL
  Filled 2022-05-08 (×2): qty 1

## 2022-05-08 MED ORDER — DILTIAZEM HCL ER COATED BEADS 120 MG PO CP24
120.0000 mg | ORAL_CAPSULE | Freq: Every day | ORAL | Status: DC
  Administered 2022-05-08 – 2022-05-09 (×2): 120 mg via ORAL
  Filled 2022-05-08 (×2): qty 1

## 2022-05-08 NOTE — Assessment & Plan Note (Signed)
History of complete heart block and sick sinus syndrome

## 2022-05-08 NOTE — Assessment & Plan Note (Signed)
Patient appears euvolemic on admission.  Reviewed echo from June which showed EF 60-65%, moderate LVH, severely elevated PASP, severe dilation of both atria, severe MS, mild-mod MR, severe TR, mild-severe AS, mild AR. -- Continue home metoprolol, Lasix --Monitor volume status closely

## 2022-05-08 NOTE — Assessment & Plan Note (Signed)
Stable, no complaints of chest pain. Continue metoprolol, Imdur, statin

## 2022-05-08 NOTE — Assessment & Plan Note (Signed)
Continue Crestor 

## 2022-05-08 NOTE — Assessment & Plan Note (Addendum)
With secondary COPD exacerbation and intermittent respiratory distress related to severe coughing fits. -- Management of COPD exacerbation as outlined --Scheduled and as needed nebulizer treatments --Mucinex and other supportive care per orders --Pulmonary hygiene with I-S and flutter

## 2022-05-08 NOTE — Assessment & Plan Note (Signed)
Influenza A infection complicated by exacerbation of COPD.  Patient has diffuse wheezing, poor air movement on exam. --Started on prednisone --Bronchodilators, Mucinex and other supportive care per orders --Monitor O2 and supplement if sats below 90% on room air
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
## 2022-05-08 NOTE — Assessment & Plan Note (Signed)
Continue Pradaxa, Cardizem CD, Toprol-XL. Monitor on telemetry

## 2022-05-08 NOTE — Assessment & Plan Note (Signed)
Continue statin and Pradaxa

## 2022-05-08 NOTE — Assessment & Plan Note (Signed)
Continue home medications: Cardizem, Lasix, Imdur, Toprol-XL Monitor BP

## 2022-05-08 NOTE — ED Provider Notes (Signed)
[facility] DEPT Provider Note   CSN: [id number] Arrival date & time: 05/08/22  1104     History  Chief complaint shortness of breath  [name] is a 88 y.o. female.  HPI   Patient has a history of reflux, right bundle branch block, pacemaker, allergic rhinitis, coronary artery disease, atrial fibrillation, hypercholesterolemia, abdominal aortic aneurysm, aortic valve stenosis who presents to the ER for evaluation of shortness of breath.  Patient lives at home.  She has an aide that checks on her.  Patient has been having trouble with cough, nonproductive, shortness of breath since Monday.  No known ill contacts but family did visit her for the holidays.  Symptoms have progressed.  Patient denies any chest pain.  No known fevers.  Home Medications Prior to Admission medications   Medication Sig Start Date End Date Taking? Authorizing Provider  acetaminophen (TYLENOL) 500 MG tablet Take 1,000 mg by mouth every 6 (six) hours as needed for mild pain, moderate pain or headache.    [provider]  busPIRone (BUSPAR) 5 MG tablet Take 5 mg by mouth 2 (two) times daily. Start 10/17 take twice daily for 30 days.    [provider]  Calcium Carbonate-Vitamin D (CALCIUM + D PO) Take 1 tablet by mouth 2 (two) times daily.    [provider]  dabigatran (PRADAXA) 75 MG CAPS capsule Take 1 capsule (75 mg total) by mouth every 12 (twelve) hours. 03/21/22   Croitoru, Mihai, MD  diltiazem (CARDIZEM CD) 120 MG 24 hr capsule Take 120 mg by mouth daily. Start 02/26/22, take for 30 days per Dr. Ashby Dawes    [provider]  fluticasone Asencion Islam) 50 MCG/ACT nasal spray Place 1 spray into both nostrils daily as needed for allergies or rhinitis.    [provider]  furosemide (LASIX) 40 MG tablet Take 40 mg every morning 04/18/22   Croitoru, Mihai, MD  isosorbide mononitrate (IMDUR) 30 MG 24 hr tablet Take 1 tablet (30 mg total) by  mouth daily. 04/18/22 04/13/23  Croitoru, Mihai, MD  magnesium oxide (MAG-OX) 400 (241.3 Mg) MG tablet Take 1 tablet (400 mg total) by mouth daily. 08/18/17   Croitoru, Mihai, MD  meclizine (ANTIVERT) 12.5 MG tablet Take 1 tablet (12.5 mg total) by mouth 3 (three) times daily as needed for dizziness. 10/14/21   Pokhrel, Corrie Mckusick, MD  metoprolol succinate (TOPROL-XL) 50 MG 24 hr tablet TAKE 1 TABLET BY MOUTH TWICE DAILY. TAKE WITH OR IMMEDIATELY FOLLOWING A MEAL Patient taking differently: Take 50 mg by mouth in the morning and at bedtime. Take with or immediately following a meal. 06/18/21   Croitoru, Mihai, MD  Multiple Vitamin (MULTIVITAMIN WITH MINERALS) TABS tablet Take 1 tablet by mouth daily.    [provider]  Multiple Vitamins-Minerals (PRESERVISION AREDS 2) CAPS Take 1 capsule by mouth 2 (two) times daily.     [provider]  nitroGLYCERIN (NITROSTAT) 0.4 MG SL tablet Place 1 tablet (0.4 mg total) under the tongue every 5 (five) minutes as needed for chest pain. DO NOT TAKE MORE THAN 3 DOSES WITHIN 15 MINS Patient taking differently: Place 0.4 mg under the tongue every 5 (five) minutes as needed for chest pain. Do not take more than 3 doses within 15 minutes 01/08/21   Croitoru, Mihai, MD  Omega-3 Fatty Acids (FISH OIL) 1000 MG CAPS Take 1,000 mg by mouth daily.    [provider]  omeprazole (PRILOSEC) 40 MG capsule Take 40 mg by  mouth daily. 03/26/21   [provider]  Polyethyl Glycol-Propyl Glycol (SYSTANE OP) Place 2 drops into both eyes 2 (two) times daily as needed (for dry eyes).    [provider]  potassium chloride 20 MEQ TBCR Take 20 mEq by mouth daily. 03/21/22   Croitoru, Mihai, MD  rosuvastatin (CRESTOR) 5 MG tablet Take 5 mg by mouth daily. 04/04/21   [provider]      Allergies    Tramadol and Protonix [pantoprazole]    Review of Systems   Review of Systems  Physical Exam Updated Vital Signs BP (!) 172/72   Pulse 70    Temp 97.8 F (36.6 C) (Oral)   Resp (!) 29   SpO2 96%  Physical Exam Vitals and nursing note reviewed.  Constitutional:      Appearance: She is not diaphoretic.     Comments: Elderly, frail  HENT:     Head: Normocephalic and atraumatic.     Right Ear: External ear normal.     Left Ear: External ear normal.  Eyes:     General: No scleral icterus.       Right eye: No discharge.        Left eye: No discharge.     Conjunctiva/sclera: Conjunctivae normal.  Neck:     Trachea: No tracheal deviation.  Cardiovascular:     Rate and Rhythm: Normal rate and regular rhythm.  Pulmonary:     Effort: Accessory muscle usage present. No respiratory distress.     Breath sounds: No stridor. Wheezing present. No rales.  Abdominal:     General: Bowel sounds are normal. There is no distension.     Palpations: Abdomen is soft.     Tenderness: There is no abdominal tenderness. There is no guarding or rebound.  Musculoskeletal:        General: No tenderness or deformity.     Cervical back: Neck supple.  Skin:    General: Skin is warm and dry.     Findings: No rash.  Neurological:     General: No focal deficit present.     Mental Status: She is alert.     Cranial Nerves: No cranial nerve deficit, dysarthria or facial asymmetry.     Sensory: No sensory deficit.     Motor: No abnormal muscle tone or seizure activity.     Coordination: Coordination normal.  Psychiatric:        Mood and Affect: Mood normal.     ED Results / Procedures / Treatments   Labs (all labs ordered are listed, but only abnormal results are displayed) Labs Reviewed  RESP PANEL BY RT-PCR (RSV, FLU A&B, COVID)  RVPGX2 - Abnormal; Notable for the following components:      Result Value   Influenza A by PCR POSITIVE (*)    All other components within normal limits  COMPREHENSIVE METABOLIC PANEL - Abnormal; Notable for the following components:   CO2 16 (*)    Glucose, Bld 121 (*)    Calcium 7.9 (*)    Total Protein 5.6  (*)    Albumin 3.3 (*)    Total Bilirubin 1.5 (*)    All other components within normal limits  CBC - Abnormal; Notable for the following components:   HCT 48.0 (*)    Platelets 147 (*)    All other components within normal limits  BRAIN NATRIURETIC PEPTIDE - Abnormal; Notable for the following components:   B Natriuretic Peptide 838.0 (*)  All other components within normal limits  TROPONIN I (HIGH SENSITIVITY)    EKG EKG Interpretation  Date/Time:  Wednesday May 08 2022 11:43:42 EST Ventricular Rate:  70 PR Interval:    QRS Duration: 140 QT Interval:  448 QTC Calculation: 484 R Axis:   -86 Text Interpretation: Electronic ventricular pacemaker Premature ventricular complexes Atrial fibrillation No significant change since last tracing Confirmed by Dorie Rank [phone]) on 05/08/2022 12:18:04 PM  Radiology DG Chest Port 1 View  Result Date: 05/08/2022 CLINICAL DATA:  Dyspnea.  Nonproductive cough EXAM: PORTABLE CHEST 1 VIEW COMPARISON:  Chest 02/24/2022 FINDINGS: Mild cardiac enlargement. Negative for heart failure. Dual lead pacemaker unchanged with leads in right atrium and right ventricle grade atherosclerotic calcification aortic arch. Lungs are clear without infiltrate or effusion. IMPRESSION: No active disease. Electronically Signed   By: Franchot Gallo M.D.   On: 05/08/2022 12:47    Procedures Procedures    Medications Ordered in ED Medications  ipratropium-albuterol (DUONEB) 0.5-2.5 (3) MG/3ML nebulizer solution 3 mL (3 mLs Nebulization Given 05/08/22 1158)  predniSONE (DELTASONE) tablet 40 mg (40 mg Oral Given 05/08/22 1312)    ED Course/ Medical Decision Making/ A&P Clinical Course as of 05/08/22 1337  Wed May 08, 2022  1259 Resp panel by RT-PCR (RSV, Flu A&B, Covid) Anterior Nasal Swab(!) Positive for influenza [JK]  1259 Comprehensive metabolic panel(!) Bicarb decreased [JK]  1259 CBC(!) Normal [JK]  1259 DG Chest Port 1 View Chest x-ray without  acute abnormality [JK]  1300 Troponin I (High Sensitivity) Troponin normal [JK]  1300 Persistent tachypnea noted on vital signs [JK]  1307 Patient still having some wheezing on exam.  She remains tachypneic.  Oxygen saturation in the low 90s and when she talks it starts to decrease.  Patient started on supplemental nasal cannula oxygen.  I will order a dose of steroids. [JK]  1334 Case discussed with Dr Arbutus Ped regarding admission. [JK]    Clinical Course User Index [JK] Dorie Rank, MD                           Medical Decision Making Problems Addressed: Acute bronchitis with bronchospasm: acute illness or injury that poses a threat to life or bodily functions Influenza: acute illness or injury that poses a threat to life or bodily functions  Amount and/or Complexity of Data Reviewed Labs: ordered. Decision-making details documented in ED Course. Radiology: ordered and independent interpretation performed. Decision-making details documented in ED Course.  Risk Prescription drug management. Decision regarding hospitalization.   Patient presented to the ED for evaluation of acute shortness of breath.  Patient noted to have significant wheezing on exam associated with tachypnea and increased work of breathing.  Patient fortunately still able to speak in full sentences.  No severe hypoxia.  ED workup notable for cute influenza.  No evidence of pneumonia on x-ray.  Patient does have a decreased bicarb and elevated BNP but no signs of severe dehydration.  Will continue to monitor.  Troponins normal.  CBC unremarkable.  No anemia or signs of acute myocardial ischemia.  She however remains tachypneic with wheezing.  Bronchospasm induced by acute influenza infection.  With her age comorbidities and persistent tachypnea I will consult the medical service for admission further treatment.        Final Clinical Impression(s) / ED Diagnoses Final diagnoses:  Acute bronchitis with bronchospasm   Influenza    Rx / DC Orders ED Discharge Orders  None         Dorie Rank, MD 05/08/22 [phone]

## 2022-05-08 NOTE — ED Triage Notes (Addendum)
Pt via EMS from home c/o COB since Monday. Pt developed a nonproductive cough with associated SOB and symptoms have continued to worsen. Home health visited today and advised her to come to ER for eval due to presentation. EMS states pt had obvious respiratory distress with global wheezing and rales. Pt has received 2 nebs PTA.   RR 40 BP 178/92 demand paced rhythm HR 70 O2 94% RA Afebrile CBG 141  Pt a/o x 4 but hard of hearing. Hx COPD with no baseline oxygen requirement. 20g in right hand and received '10mg'$  albuterol, 0.'5mg'$  atrovent, and '125mg'$  solumedrol en route.

## 2022-05-08 NOTE — Assessment & Plan Note (Signed)
Continue PPI ?

## 2022-05-08 NOTE — Assessment & Plan Note (Signed)
Secondary to influenza A and COPD exacerbation.  Patient is quite tachypneic with respiratory rate 20s to 40s at times, worse after severe coughing spells. -- Supplement oxygen for comfort or if O2 sats <90% --Management of COPD and influenza A as outlined

## 2022-05-09 DIAGNOSIS — Z8249 Family history of ischemic heart disease and other diseases of the circulatory system: Secondary | ICD-10-CM | POA: Diagnosis not present

## 2022-05-09 DIAGNOSIS — Z1152 Encounter for screening for COVID-19: Secondary | ICD-10-CM | POA: Diagnosis not present

## 2022-05-09 DIAGNOSIS — Z8042 Family history of malignant neoplasm of prostate: Secondary | ICD-10-CM | POA: Diagnosis not present

## 2022-05-09 DIAGNOSIS — K219 Gastro-esophageal reflux disease without esophagitis: Secondary | ICD-10-CM | POA: Diagnosis present

## 2022-05-09 DIAGNOSIS — J209 Acute bronchitis, unspecified: Secondary | ICD-10-CM

## 2022-05-09 DIAGNOSIS — Z87891 Personal history of nicotine dependence: Secondary | ICD-10-CM | POA: Diagnosis not present

## 2022-05-09 DIAGNOSIS — I2510 Atherosclerotic heart disease of native coronary artery without angina pectoris: Secondary | ICD-10-CM | POA: Diagnosis present

## 2022-05-09 DIAGNOSIS — I110 Hypertensive heart disease with heart failure: Secondary | ICD-10-CM | POA: Diagnosis present

## 2022-05-09 DIAGNOSIS — I495 Sick sinus syndrome: Secondary | ICD-10-CM | POA: Diagnosis present

## 2022-05-09 DIAGNOSIS — I442 Atrioventricular block, complete: Secondary | ICD-10-CM | POA: Diagnosis present

## 2022-05-09 DIAGNOSIS — Z950 Presence of cardiac pacemaker: Secondary | ICD-10-CM | POA: Diagnosis not present

## 2022-05-09 DIAGNOSIS — J441 Chronic obstructive pulmonary disease with (acute) exacerbation: Secondary | ICD-10-CM | POA: Diagnosis present

## 2022-05-09 DIAGNOSIS — Z79899 Other long term (current) drug therapy: Secondary | ICD-10-CM | POA: Diagnosis not present

## 2022-05-09 DIAGNOSIS — Z8673 Personal history of transient ischemic attack (TIA), and cerebral infarction without residual deficits: Secondary | ICD-10-CM | POA: Diagnosis not present

## 2022-05-09 DIAGNOSIS — I5032 Chronic diastolic (congestive) heart failure: Secondary | ICD-10-CM | POA: Diagnosis present

## 2022-05-09 DIAGNOSIS — Z7901 Long term (current) use of anticoagulants: Secondary | ICD-10-CM | POA: Diagnosis not present

## 2022-05-09 DIAGNOSIS — I4819 Other persistent atrial fibrillation: Secondary | ICD-10-CM | POA: Diagnosis present

## 2022-05-09 DIAGNOSIS — Z8261 Family history of arthritis: Secondary | ICD-10-CM | POA: Diagnosis not present

## 2022-05-09 DIAGNOSIS — J101 Influenza due to other identified influenza virus with other respiratory manifestations: Secondary | ICD-10-CM | POA: Diagnosis present

## 2022-05-09 DIAGNOSIS — J111 Influenza due to unidentified influenza virus with other respiratory manifestations: Secondary | ICD-10-CM

## 2022-05-09 DIAGNOSIS — E7800 Pure hypercholesterolemia, unspecified: Secondary | ICD-10-CM | POA: Diagnosis present

## 2022-05-09 DIAGNOSIS — Z86010 Personal history of colonic polyps: Secondary | ICD-10-CM | POA: Diagnosis not present

## 2022-05-09 DIAGNOSIS — I714 Abdominal aortic aneurysm, without rupture, unspecified: Secondary | ICD-10-CM | POA: Diagnosis present

## 2022-05-09 DIAGNOSIS — I350 Nonrheumatic aortic (valve) stenosis: Secondary | ICD-10-CM | POA: Diagnosis present

## 2022-05-09 DIAGNOSIS — I351 Nonrheumatic aortic (valve) insufficiency: Secondary | ICD-10-CM | POA: Diagnosis present

## 2022-05-09 LAB — CBC
HCT: 44.2 % (ref 36.0–46.0)
Hemoglobin: 14.4 g/dL (ref 12.0–15.0)
MCH: 30.6 pg (ref 26.0–34.0)
MCHC: 32.6 g/dL (ref 30.0–36.0)
MCV: 93.8 fL (ref 80.0–100.0)
Platelets: 149 10*3/uL — ABNORMAL LOW (ref 150–400)
RBC: 4.71 MIL/uL (ref 3.87–5.11)
RDW: 14.6 % (ref 11.5–15.5)
WBC: 8.7 10*3/uL (ref 4.0–10.5)
nRBC: 0 % (ref 0.0–0.2)

## 2022-05-09 LAB — BASIC METABOLIC PANEL
Anion gap: 9 (ref 5–15)
BUN: 28 mg/dL — ABNORMAL HIGH (ref 8–23)
CO2: 23 mmol/L (ref 22–32)
Calcium: 9 mg/dL (ref 8.9–10.3)
Chloride: 106 mmol/L (ref 98–111)
Creatinine, Ser: 0.74 mg/dL (ref 0.44–1.00)
GFR, Estimated: >60 mL/min (ref >60)
Glucose, Bld: 122 mg/dL — ABNORMAL HIGH (ref 70–99)
Potassium: 4.6 mmol/L (ref 3.5–5.1)
Sodium: 138 mmol/L (ref 135–145)

## 2022-05-09 MED ORDER — IPRATROPIUM-ALBUTEROL 0.5-2.5 (3) MG/3ML IN SOLN
3.0000 mL | Freq: Four times a day (QID) | RESPIRATORY_TRACT | Status: DC
  Administered 2022-05-09 – 2022-05-10 (×6): 3 mL via RESPIRATORY_TRACT
  Filled 2022-05-09 (×6): qty 3

## 2022-05-09 MED ORDER — HYDRALAZINE HCL 20 MG/ML IJ SOLN
10.0000 mg | Freq: Four times a day (QID) | INTRAMUSCULAR | Status: AC | PRN
  Administered 2022-05-09: 10 mg via INTRAVENOUS
  Filled 2022-05-09: qty 1

## 2022-05-09 MED ORDER — GERHARDT'S BUTT CREAM
TOPICAL_CREAM | Freq: Four times a day (QID) | CUTANEOUS | Status: DC | PRN
  Filled 2022-05-09: qty 1

## 2022-05-09 NOTE — Progress Notes (Signed)
Mobility Specialist - Progress Note   05/09/22 1112  Mobility  Activity Ambulated with assistance to bathroom;Ambulated with assistance in hallway  Level of Assistance Standby assist, set-up cues, supervision of patient - no hands on  Assistive Device Front wheel walker  Distance Ambulated (ft) 80 ft  Activity Response Tolerated well  Mobility Referral Yes  $Mobility charge 1 Mobility   Pt received in bed and agreeable to mobility. Pt requested assistance to bathroom for BM prior to ambulating. Pt is MinA from sit-to-stand & contact guard during ambulation. C/o SOB after ambulating to bathroom. O2 checked. Pt still eager to ambulate after feeling SOB. Pt to bed after hallway ambulation with all needs met & call bell in reach.   During mobility: 70 HR, 96% SpO2 (RA)  Set designer

## 2022-05-09 NOTE — Hospital Course (Signed)
88 y.o. female with medical history significant of complete heart block status post pacemaker, hypertension, hyperlipidemia, chronic HFpEF, aortic stenosis, history of stroke, GERD, AAA, thoracic aortic dissection, COPD not on home oxygen at baseline who presented to the ED for evaluation of worsening shortness of breath for the past 2 days with associated nonproductive cough.  Patient lives at home with supportive caregivers and family around-the-clock.  Caregiver today found patient to be in respiratory distress with significant wheezing and coughing fits.  EMS was called and brought patient to the ED.  They gave nebulizer treatment and steroid and route.  Seen in the ED with granddaughter and great granddaughter at bedside, patient reported severe coughing fits that lead to difficulty breathing.  She is not able to produce phlegm, states it feels stuck.  Reports chronic poor appetite but worse recently, generalized weakness.  She reports getting nauseated after some coughing fits, episodes of chills and sweats but no measured fever at home.  Patient had a couple of coughing episodes during my encounter and noted to be gasping with difficulty catching her breath afterward   Pt found to be pos for flu A

## 2022-05-09 NOTE — Progress Notes (Signed)
Progress Note   Patient: [name] EYC:[id number] DOB: [birth year] DOA: 05/08/2022     0 DOS: the patient was seen and examined on 05/09/2022   Brief hospital course: 88 y.o. female with medical history significant of complete heart block status post pacemaker, hypertension, hyperlipidemia, chronic HFpEF, aortic stenosis, history of stroke, GERD, AAA, thoracic aortic dissection, COPD not on home oxygen at baseline who presented to the ED for evaluation of worsening shortness of breath for the past 2 days with associated nonproductive cough.  Patient lives at home with supportive caregivers and family around-the-clock.  Caregiver today found patient to be in respiratory distress with significant wheezing and coughing fits.  EMS was called and brought patient to the ED.  They gave nebulizer treatment and steroid and route.  Seen in the ED with granddaughter and great granddaughter at bedside, patient reported severe coughing fits that lead to difficulty breathing.  She is not able to produce phlegm, states it feels stuck.  Reports chronic poor appetite but worse recently, generalized weakness.  She reports getting nauseated after some coughing fits, episodes of chills and sweats but no measured fever at home.  Patient had a couple of coughing episodes during my encounter and noted to be gasping with difficulty catching her breath afterward   Pt found to be pos for flu A  Assessment and Plan: * Influenza A With secondary COPD exacerbation  - continued on tamiflu --continue scheduled and as needed nebulizer treatments --Mucinex and other supportive care per orders --Cont with pulmonary hygiene with I-S and flutter -This AM, noted to have heavy coughing and wheezing spell, improved with neb tx -Currently on RA   Acute respiratory distress Secondary to influenza A and COPD exacerbation.  Patient is quite tachypneic with respiratory rate 20s to 40s at times, worse after severe coughing spells. --  Supplement oxygen for comfort or if O2 sats <90%, now on RA --Management of COPD and influenza A as outlined   COPD with acute exacerbation ([place]) Influenza A infection complicated by exacerbation of COPD.  Patient has diffuse wheezing, poor air movement on exam. --Started on prednisone --Bronchodilators, Mucinex and other supportive care per orders   Chronic diastolic heart failure [facility]) Patient appears euvolemic on admission.  Reviewed echo from June which showed EF 60-65%, moderate LVH, severely elevated PASP, severe dilation of both atria, severe MS, mild-mod MR, severe TR, mild-severe AS, mild AR. -- Continue home metoprolol, Lasix   HTN (hypertension) Continue home medications: Cardizem, Lasix, Imdur, Toprol-XL BP stable thus far   Coronary artery disease involving native coronary artery of native heart without angina pectoris Stable, no complaints of chest pain. Continue metoprolol, Imdur, statin   Hypercholesterolemia Continue Crestor   Pacemaker History of complete heart block and sick sinus syndrome   Persistent atrial fibrillation, failed DCCV 11/12, turned down for RFA Continue Pradaxa, Cardizem CD, Toprol-XL. Monitor on telemetry   GERD Continue PPI   CVA (cerebral vascular accident) ([place]) Continue statin and Pradaxa Seems stable at this time      Subjective: Reports feeling and breathing better after neb tx this AM. Feeling very tired and worn out  Physical Exam: Vitals:   05/09/22 0459 05/09/22 0913 05/09/22 1322 05/09/22 1409  BP: (!) 152/78   (!) 107/50  Pulse: 70   69  Resp: 18   17  Temp: 97.9 F (36.6 C)   (!) 97.3 F (36.3 C)  TempSrc:    Oral  SpO2: 96% 94% 95% 94%  Weight:  Height:       General exam: Awake, laying in bed, in nad Respiratory system: Normal respiratory effort, no wheezing Cardiovascular system: regular rate, s1, s2 Gastrointestinal system: Soft, nondistended, positive BS Central nervous system: CN2-12 grossly intact,  strength intact Extremities: Perfused, no clubbing Skin: Normal skin turgor, no notable skin lesions seen Psychiatry: Mood normal // no visual hallucinations   Data Reviewed:  Labs reviewed: Na 138, K 4.6, Cr 0.74, WBC 8.7, Hgb 14.4  Family Communication: Pt in room, family not at bedside  Disposition: Status is: Observation The patient will require care spanning > 2 midnights and should be moved to inpatient because: Severity of illness  Planned Discharge Destination: Home    Author: Marylu Lund, MD 05/09/2022 4:15 PM  For on call review [email or web address].

## 2022-05-10 DIAGNOSIS — J209 Acute bronchitis, unspecified: Secondary | ICD-10-CM | POA: Diagnosis not present

## 2022-05-10 DIAGNOSIS — J101 Influenza due to other identified influenza virus with other respiratory manifestations: Secondary | ICD-10-CM | POA: Diagnosis not present

## 2022-05-10 DIAGNOSIS — J111 Influenza due to unidentified influenza virus with other respiratory manifestations: Secondary | ICD-10-CM | POA: Diagnosis not present

## 2022-05-10 LAB — BASIC METABOLIC PANEL
Anion gap: 10 (ref 5–15)
BUN: 26 mg/dL — ABNORMAL HIGH (ref 8–23)
CO2: 24 mmol/L (ref 22–32)
Calcium: 9 mg/dL (ref 8.9–10.3)
Chloride: 99 mmol/L (ref 98–111)
Creatinine, Ser: 0.88 mg/dL (ref 0.44–1.00)
GFR, Estimated: >60 mL/min (ref >60)
Glucose, Bld: 165 mg/dL — ABNORMAL HIGH (ref 70–99)
Potassium: 4.2 mmol/L (ref 3.5–5.1)
Sodium: 133 mmol/L — ABNORMAL LOW (ref 135–145)

## 2022-05-10 LAB — COMPREHENSIVE METABOLIC PANEL
ALT: 21 U/L (ref 0–44)
AST: 31 U/L (ref 15–41)
Albumin: 3.1 g/dL — ABNORMAL LOW (ref 3.5–5.0)
Alkaline Phosphatase: 94 U/L (ref 38–126)
Anion gap: 8 (ref 5–15)
BUN: 29 mg/dL — ABNORMAL HIGH (ref 8–23)
CO2: 27 mmol/L (ref 22–32)
Calcium: 9.5 mg/dL (ref 8.9–10.3)
Chloride: 103 mmol/L (ref 98–111)
Creatinine, Ser: 0.89 mg/dL (ref 0.44–1.00)
GFR, Estimated: >60 mL/min (ref >60)
Glucose, Bld: 102 mg/dL — ABNORMAL HIGH (ref 70–99)
Potassium: 5.9 mmol/L — ABNORMAL HIGH (ref 3.5–5.1)
Sodium: 138 mmol/L (ref 135–145)
Total Bilirubin: 1.2 mg/dL (ref 0.3–1.2)
Total Protein: 6.1 g/dL — ABNORMAL LOW (ref 6.5–8.1)

## 2022-05-10 LAB — CBC
HCT: 46.9 % — ABNORMAL HIGH (ref 36.0–46.0)
Hemoglobin: 14.7 g/dL (ref 12.0–15.0)
MCH: 30.9 pg (ref 26.0–34.0)
MCHC: 31.3 g/dL (ref 30.0–36.0)
MCV: 98.7 fL (ref 80.0–100.0)
Platelets: 155 10*3/uL (ref 150–400)
RBC: 4.75 MIL/uL (ref 3.87–5.11)
RDW: 14.7 % (ref 11.5–15.5)
WBC: 13.4 10*3/uL — ABNORMAL HIGH (ref 4.0–10.5)
nRBC: 0 % (ref 0.0–0.2)

## 2022-05-10 MED ORDER — OSELTAMIVIR PHOSPHATE 30 MG PO CAPS: 30.0000 mg | ORAL_CAPSULE | Freq: Two times a day (BID) | ORAL | 0 refills | Status: AC

## 2022-05-10 MED ORDER — ALBUTEROL SULFATE HFA 108 (90 BASE) MCG/ACT IN AERS: 2.0000 | INHALATION_SPRAY | Freq: Four times a day (QID) | RESPIRATORY_TRACT | 0 refills | Status: DC | PRN

## 2022-05-10 MED ORDER — SODIUM ZIRCONIUM CYCLOSILICATE 5 G PO PACK
5.0000 g | PACK | Freq: Two times a day (BID) | ORAL | Status: DC
  Administered 2022-05-10: 5 g via ORAL
  Filled 2022-05-10 (×2): qty 1

## 2022-05-10 MED ORDER — PREDNISONE 10 MG PO TABS: ORAL_TABLET | ORAL | 0 refills | Status: AC

## 2022-05-10 NOTE — Progress Notes (Signed)
Mobility Specialist - Progress Note   05/10/22 1054  Mobility  Activity Ambulated with assistance in hallway  Level of Assistance Standby assist, set-up cues, supervision of patient - no hands on  Assistive Device Front wheel walker  Distance Ambulated (ft) 260 ft  Activity Response Tolerated well  Mobility Referral Yes  $Mobility charge 1 Mobility   Pt received in bed and agreeable to mobility. Pt is MinA from sit-to-stand & standby during ambulation. Pt had some SOB of breath throughout session but stated she was okay. No other complaints during mobility. Pt to bed after session with all need met & MD in room.   [facility]

## 2022-05-10 NOTE — Discharge Summary (Signed)
Physician Discharge Summary   Patient: [name] MRN: [id number] DOB: 22-Mar-1926  Admit date:     05/08/2022  Discharge date: 05/10/22  Discharge Physician: Marylu Lund   PCP: Merrilee Seashore, MD   Recommendations at discharge:    Follow up with PCP in 1-2 weeks  Discharge Diagnoses: Principal Problem:   Influenza A Active Problems:   COPD with acute exacerbation (HCC)   Acute respiratory distress   Chronic diastolic heart failure (HCC)   HTN (hypertension)   Persistent atrial fibrillation, failed DCCV 11/12, turned down for RFA   Pacemaker   Hypercholesterolemia   Coronary artery disease involving native coronary artery of native heart without angina pectoris   GERD   CVA (cerebral vascular accident) ([place])  Resolved Problems:   * No resolved hospital problems. *  Hospital Course: 88 y.o. female with medical history significant of complete heart block status post pacemaker, hypertension, hyperlipidemia, chronic HFpEF, aortic stenosis, history of stroke, GERD, AAA, thoracic aortic dissection, COPD not on home oxygen at baseline who presented to the ED for evaluation of worsening shortness of breath for the past 2 days with associated nonproductive cough.  Patient lives at home with supportive caregivers and family around-the-clock.  Caregiver today found patient to be in respiratory distress with significant wheezing and coughing fits.  EMS was called and brought patient to the ED.  They gave nebulizer treatment and steroid and route.  Seen in the ED with granddaughter and great granddaughter at bedside, patient reported severe coughing fits that lead to difficulty breathing.  She is not able to produce phlegm, states it feels stuck.  Reports chronic poor appetite but worse recently, generalized weakness.  She reports getting nauseated after some coughing fits, episodes of chills and sweats but no measured fever at home.  Patient had a couple of coughing episodes during my  encounter and noted to be gasping with difficulty catching her breath afterward   Pt found to be pos for flu A  Assessment and Plan: * Influenza A With secondary COPD exacerbation  - continued on tamiflu --givne scheduled and as needed nebulizer treatments, given steroids --Mucinex and other supportive care per orders --Cont with pulmonary hygiene with I-S and flutter -clinically improved and pt weaned to RA, ambulated without needing o2   Acute respiratory distress Secondary to influenza A and COPD exacerbation.  Patient initially quite tachypneic with respiratory rate 20s to 40s at times, worse after severe coughing spells. --Management of COPD and influenza A as outlined above   COPD with acute exacerbation ([place]) Influenza A infection complicated by exacerbation of COPD.  Patient has diffuse wheezing, poor air movement on exam. --Started on prednisone, taper as outpatient --Bronchodilators, Mucinex and other supportive care per orders   Chronic diastolic heart failure [facility]) Patient appears euvolemic on admission.  Reviewed echo from June which showed EF 60-65%, moderate LVH, severely elevated PASP, severe dilation of both atria, severe MS, mild-mod MR, severe TR, mild-severe AS, mild AR. -- Continue home metoprolol, Lasix   HTN (hypertension) Continue home medications: Cardizem, Lasix, Imdur, Toprol-XL   Coronary artery disease involving native coronary artery of native heart without angina pectoris Stable, no complaints of chest pain. Continue metoprolol, Imdur, statin   Hypercholesterolemia Continue Crestor   Pacemaker History of complete heart block and sick sinus syndrome   Persistent atrial fibrillation, failed DCCV 11/12, turned down for RFA Continue Pradaxa, Cardizem CD, Toprol-XL. Monitor on telemetry   GERD Continue PPI   CVA (cerebral vascular accident) ([place])  Continue statin and Pradaxa Seems stable at this time       Consultants:  Procedures  performed:   Disposition: Home Diet recommendation:  Cardiac diet DISCHARGE MEDICATION: Allergies as of 05/10/2022       Reactions   Tramadol Other (See Comments)   Medication made her feel "off and talk to herself"   Protonix [pantoprazole] Other (See Comments)   Abdominal pain        Medication List     STOP taking these medications    Potassium Chloride ER 20 MEQ Tbcr       TAKE these medications    acetaminophen 500 MG tablet Commonly known as: TYLENOL Take 1,000 mg by mouth every 6 (six) hours as needed for mild pain, moderate pain or headache.   albuterol 108 (90 Base) MCG/ACT inhaler Commonly known as: VENTOLIN HFA Inhale 2 puffs into the lungs every 6 (six) hours as needed for wheezing or shortness of breath.   busPIRone 5 MG tablet Commonly known as: BUSPAR Take 5 mg by mouth 2 (two) times daily. Start 10/17 take twice daily for 30 days.   CALCIUM + D PO Take 1 tablet by mouth 2 (two) times daily.   Cardizem CD 120 MG 24 hr capsule Generic drug: diltiazem Take 120 mg by mouth at bedtime. Start 02/26/22, take for 30 days per Dr. Ashby Dawes   dabigatran 75 MG Caps capsule Commonly known as: PRADAXA Take 1 capsule (75 mg total) by mouth every 12 (twelve) hours.   dorzolamide 2 % ophthalmic solution Commonly known as: TRUSOPT Place 1 drop into the right eye 2 (two) times daily as needed (pain).   Fish Oil 1000 MG Caps Take 1,000 mg by mouth daily.   fluticasone 50 MCG/ACT nasal spray Commonly known as: FLONASE Place 1 spray into both nostrils daily as needed for allergies or rhinitis.   furosemide 40 MG tablet Commonly known as: LASIX Take 40 mg every morning   isosorbide mononitrate 30 MG 24 hr tablet Commonly known as: IMDUR Take 1 tablet (30 mg total) by mouth daily.   magnesium oxide 400 (241.3 Mg) MG tablet Commonly known as: MAG-OX Take 1 tablet (400 mg total) by mouth daily.   meclizine 12.5 MG tablet Commonly known as:  ANTIVERT Take 1 tablet (12.5 mg total) by mouth 3 (three) times daily as needed for dizziness.   metoprolol succinate 50 MG 24 hr tablet Commonly known as: TOPROL-XL TAKE 1 TABLET BY MOUTH TWICE DAILY. TAKE WITH OR IMMEDIATELY FOLLOWING A MEAL What changed:  how much to take how to take this when to take this additional instructions   multivitamin with minerals Tabs tablet Take 1 tablet by mouth daily.   nitroGLYCERIN 0.4 MG SL tablet Commonly known as: NITROSTAT Place 1 tablet (0.4 mg total) under the tongue every 5 (five) minutes as needed for chest pain. DO NOT TAKE MORE THAN 3 DOSES WITHIN 15 MINS What changed: additional instructions   oseltamivir 30 MG capsule Commonly known as: TAMIFLU Take 1 capsule (30 mg total) by mouth 2 (two) times daily for 4 days.   pantoprazole 40 MG tablet Commonly known as: PROTONIX Take 40 mg by mouth daily.   predniSONE 10 MG tablet Commonly known as: DELTASONE Take 4 tablets (40 mg total) by mouth daily with breakfast for 2 days, THEN 2 tablets (20 mg total) daily with breakfast for 2 days, THEN 1 tablet (10 mg total) daily with breakfast for 2 days, THEN 0.5 tablets (5 mg  total) daily with breakfast for 2 days. Start taking on: May 10, 2022   PreserVision AREDS 2 Caps Take 1 capsule by mouth 2 (two) times daily.   rosuvastatin 5 MG tablet Commonly known as: CRESTOR Take 5 mg by mouth daily.   SYSTANE OP Place 2 drops into both eyes 2 (two) times daily as needed (for dry eyes).        Follow-up Information     Merrilee Seashore, MD Follow up in 1 week(s).   Specialty: Internal Medicine Why: Hospital follow up Contact information: [address] [place] [address] [phone]                Discharge Exam: Danley Danker Weights   05/08/22 1215 05/08/22 1700  Weight: 55.3 kg 53.1 kg   General exam: Awake, laying in bed, in nad Respiratory system: Normal respiratory effort, no  wheezing Cardiovascular system: regular rate, s1, s2 Gastrointestinal system: Soft, nondistended, positive BS Central nervous system: CN2-12 grossly intact, strength intact Extremities: Perfused, no clubbing Skin: Normal skin turgor, no notable skin lesions seen Psychiatry: Mood normal // no visual hallucinations   Condition at discharge: fair  The results of significant diagnostics from this hospitalization (including imaging, microbiology, ancillary and laboratory) are listed below for reference.   Imaging Studies: DG Chest Port 1 View  Result Date: 05/08/2022 CLINICAL DATA:  Dyspnea.  Nonproductive cough EXAM: PORTABLE CHEST 1 VIEW COMPARISON:  Chest 02/24/2022 FINDINGS: Mild cardiac enlargement. Negative for heart failure. Dual lead pacemaker unchanged with leads in right atrium and right ventricle grade atherosclerotic calcification aortic arch. Lungs are clear without infiltrate or effusion. IMPRESSION: No active disease. Electronically Signed   By: Franchot Gallo M.D.   On: 05/08/2022 12:47   CUP PACEART REMOTE DEVICE CHECK  Result Date: 05/01/2022 Scheduled remote reviewed. Normal device function.  Since 11/6 transmission, no new events Next remote 91 days. [place]   Microbiology: Results for orders placed or performed during the hospital encounter of 05/08/22  Resp panel by RT-PCR (RSV, Flu A&B, Covid) Anterior Nasal Swab     Status: Abnormal   Collection Time: 05/08/22 12:04 PM   Specimen: Anterior Nasal Swab  Result Value Ref Range Status   SARS Coronavirus 2 by RT PCR NEGATIVE NEGATIVE Final    Comment: (NOTE) SARS-CoV-2 target nucleic acids are NOT DETECTED.  The SARS-CoV-2 RNA is generally detectable in upper respiratory specimens during the acute phase of infection. The lowest concentration of SARS-CoV-2 viral copies this assay can detect is 138 copies/mL. A negative result does not preclude SARS-Cov-2 infection and should not be used as the sole basis for treatment  or other patient management decisions. A negative result may occur with  improper specimen collection/handling, submission of specimen other than nasopharyngeal swab, presence of viral mutation(s) within the areas targeted by this assay, and inadequate number of viral copies(<138 copies/mL). A negative result must be combined with clinical observations, patient history, and epidemiological information. The expected result is Negative.  Fact Sheet for Patients:  EntrepreneurPulse.com.au  Fact Sheet for Healthcare Providers:  IncredibleEmployment.be  This test is no t yet approved or cleared by the [place] FDA and  has been authorized for detection and/or diagnosis of SARS-CoV-2 by FDA under an Emergency Use Authorization (EUA). This EUA will remain  in effect (meaning this test can be used) for the duration of the COVID-19 declaration under Section 564(b)(1) of the Act, 21 U.S.C.section 360bbb-3(b)(1), unless the authorization is terminated  or revoked sooner.  Influenza A by PCR POSITIVE (A) NEGATIVE Final   Influenza B by PCR NEGATIVE NEGATIVE Final    Comment: (NOTE) The Xpert Xpress SARS-CoV-2/FLU/RSV plus assay is intended as an aid in the diagnosis of influenza from Nasopharyngeal swab specimens and should not be used as a sole basis for treatment. Nasal washings and aspirates are unacceptable for Xpert Xpress SARS-CoV-2/FLU/RSV testing.  Fact Sheet for Patients: EntrepreneurPulse.com.au  Fact Sheet for Healthcare Providers: IncredibleEmployment.be  This test is not yet approved or cleared by the [place] FDA and has been authorized for detection and/or diagnosis of SARS-CoV-2 by FDA under an Emergency Use Authorization (EUA). This EUA will remain in effect (meaning this test can be used) for the duration of the COVID-19 declaration under Section 564(b)(1) of the Act, 21  U.S.C. section 360bbb-3(b)(1), unless the authorization is terminated or revoked.     Resp Syncytial Virus by PCR NEGATIVE NEGATIVE Final    Comment: (NOTE) Fact Sheet for Patients: EntrepreneurPulse.com.au  Fact Sheet for Healthcare Providers: IncredibleEmployment.be  This test is not yet approved or cleared by the [place] FDA and has been authorized for detection and/or diagnosis of SARS-CoV-2 by FDA under an Emergency Use Authorization (EUA). This EUA will remain in effect (meaning this test can be used) for the duration of the COVID-19 declaration under Section 564(b)(1) of the Act, 21 U.S.C. section 360bbb-3(b)(1), unless the authorization is terminated or revoked.  Performed at [facility], [place] [address] [place], [place] [address]     Labs: CBC: Recent Labs  Lab 05/08/22 1204 05/09/22 0654 05/10/22 0558  WBC 8.1 8.7 13.4*  HGB 15.0 14.4 14.7  HCT 48.0* 44.2 46.9*  MCV 97.4 93.8 98.7  PLT 147* 149* 416   Basic Metabolic Panel: Recent Labs  Lab 05/08/22 1204 05/09/22 0654 05/10/22 0558 05/10/22 1654  NA 141 138 138 133*  K 3.7 4.6 5.9* 4.2  CL 111 106 103 99  CO2 16* '23 27 24  '$ GLUCOSE 121* 122* 102* 165*  BUN 23 28* 29* 26*  CREATININE 0.81 0.74 0.89 0.88  CALCIUM 7.9* 9.0 9.5 9.0   Liver Function Tests: Recent Labs  Lab 05/08/22 1204 05/10/22 0558  AST 39 31  ALT 19 21  ALKPHOS 93 94  BILITOT 1.5* 1.2  PROT 5.6* 6.1*  ALBUMIN 3.3* 3.1*   CBG: No results for input(s): "GLUCAP" in the last 168 hours.  Discharge time spent: less than 30 minutes.  Signed: Marylu Lund, MD [facility] 05/10/2022

## 2022-05-10 NOTE — Progress Notes (Signed)
  Transition of Care [facility]) Screening Note   Patient Details  Name: [name] Date of Birth: April 28, 1926   Transition of Care [facility]) CM/SW Contact:    Vassie Moselle, LCSW Phone Number: 05/10/2022, 12:16 PM    Transition of Care Department [facility]) has reviewed patient and no TOC needs have been identified at this time. We will continue to monitor patient advancement through interdisciplinary progression rounds. If new patient transition needs arise, please place a TOC consult.

## 2022-05-23 DIAGNOSIS — R5383 Other fatigue: Secondary | ICD-10-CM | POA: Diagnosis not present

## 2022-05-23 DIAGNOSIS — I129 Hypertensive chronic kidney disease with stage 1 through stage 4 chronic kidney disease, or unspecified chronic kidney disease: Secondary | ICD-10-CM | POA: Diagnosis not present

## 2022-05-24 NOTE — Progress Notes (Signed)
Remote pacemaker transmission.   

## 2022-05-27 DIAGNOSIS — R269 Unspecified abnormalities of gait and mobility: Secondary | ICD-10-CM | POA: Diagnosis not present

## 2022-05-27 DIAGNOSIS — Z09 Encounter for follow-up examination after completed treatment for conditions other than malignant neoplasm: Secondary | ICD-10-CM | POA: Diagnosis not present

## 2022-05-27 DIAGNOSIS — G9331 Postviral fatigue syndrome: Secondary | ICD-10-CM | POA: Diagnosis not present

## 2022-05-27 DIAGNOSIS — E46 Unspecified protein-calorie malnutrition: Secondary | ICD-10-CM | POA: Diagnosis not present

## 2022-05-27 DIAGNOSIS — J432 Centrilobular emphysema: Secondary | ICD-10-CM | POA: Diagnosis not present

## 2022-05-27 DIAGNOSIS — I130 Hypertensive heart and chronic kidney disease with heart failure and stage 1 through stage 4 chronic kidney disease, or unspecified chronic kidney disease: Secondary | ICD-10-CM | POA: Diagnosis not present

## 2022-05-27 DIAGNOSIS — I5030 Unspecified diastolic (congestive) heart failure: Secondary | ICD-10-CM | POA: Diagnosis not present

## 2022-05-27 DIAGNOSIS — R531 Weakness: Secondary | ICD-10-CM | POA: Diagnosis not present

## 2022-05-31 DIAGNOSIS — Z9049 Acquired absence of other specified parts of digestive tract: Secondary | ICD-10-CM | POA: Diagnosis not present

## 2022-05-31 DIAGNOSIS — Z955 Presence of coronary angioplasty implant and graft: Secondary | ICD-10-CM | POA: Diagnosis not present

## 2022-05-31 DIAGNOSIS — I4819 Other persistent atrial fibrillation: Secondary | ICD-10-CM | POA: Diagnosis not present

## 2022-05-31 DIAGNOSIS — Z8673 Personal history of transient ischemic attack (TIA), and cerebral infarction without residual deficits: Secondary | ICD-10-CM | POA: Diagnosis not present

## 2022-05-31 DIAGNOSIS — Z602 Problems related to living alone: Secondary | ICD-10-CM | POA: Diagnosis not present

## 2022-05-31 DIAGNOSIS — I130 Hypertensive heart and chronic kidney disease with heart failure and stage 1 through stage 4 chronic kidney disease, or unspecified chronic kidney disease: Secondary | ICD-10-CM | POA: Diagnosis not present

## 2022-05-31 DIAGNOSIS — M150 Primary generalized (osteo)arthritis: Secondary | ICD-10-CM | POA: Diagnosis not present

## 2022-05-31 DIAGNOSIS — E782 Mixed hyperlipidemia: Secondary | ICD-10-CM | POA: Diagnosis not present

## 2022-05-31 DIAGNOSIS — N1831 Chronic kidney disease, stage 3a: Secondary | ICD-10-CM | POA: Diagnosis not present

## 2022-05-31 DIAGNOSIS — Z9181 History of falling: Secondary | ICD-10-CM | POA: Diagnosis not present

## 2022-05-31 DIAGNOSIS — J441 Chronic obstructive pulmonary disease with (acute) exacerbation: Secondary | ICD-10-CM | POA: Diagnosis not present

## 2022-05-31 DIAGNOSIS — I5032 Chronic diastolic (congestive) heart failure: Secondary | ICD-10-CM | POA: Diagnosis not present

## 2022-05-31 DIAGNOSIS — I442 Atrioventricular block, complete: Secondary | ICD-10-CM | POA: Diagnosis not present

## 2022-05-31 DIAGNOSIS — G9331 Postviral fatigue syndrome: Secondary | ICD-10-CM | POA: Diagnosis not present

## 2022-05-31 DIAGNOSIS — K2100 Gastro-esophageal reflux disease with esophagitis, without bleeding: Secondary | ICD-10-CM | POA: Diagnosis not present

## 2022-05-31 DIAGNOSIS — E46 Unspecified protein-calorie malnutrition: Secondary | ICD-10-CM | POA: Diagnosis not present

## 2022-05-31 DIAGNOSIS — I2510 Atherosclerotic heart disease of native coronary artery without angina pectoris: Secondary | ICD-10-CM | POA: Diagnosis not present

## 2022-05-31 DIAGNOSIS — Z950 Presence of cardiac pacemaker: Secondary | ICD-10-CM | POA: Diagnosis not present

## 2022-05-31 DIAGNOSIS — J432 Centrilobular emphysema: Secondary | ICD-10-CM | POA: Diagnosis not present

## 2022-05-31 DIAGNOSIS — M1030 Gout due to renal impairment, unspecified site: Secondary | ICD-10-CM | POA: Diagnosis not present

## 2022-06-03 DIAGNOSIS — E46 Unspecified protein-calorie malnutrition: Secondary | ICD-10-CM | POA: Diagnosis not present

## 2022-06-03 DIAGNOSIS — N1831 Chronic kidney disease, stage 3a: Secondary | ICD-10-CM | POA: Diagnosis not present

## 2022-06-03 DIAGNOSIS — I130 Hypertensive heart and chronic kidney disease with heart failure and stage 1 through stage 4 chronic kidney disease, or unspecified chronic kidney disease: Secondary | ICD-10-CM | POA: Diagnosis not present

## 2022-06-03 DIAGNOSIS — G9331 Postviral fatigue syndrome: Secondary | ICD-10-CM | POA: Diagnosis not present

## 2022-06-03 DIAGNOSIS — J441 Chronic obstructive pulmonary disease with (acute) exacerbation: Secondary | ICD-10-CM | POA: Diagnosis not present

## 2022-06-03 DIAGNOSIS — I5032 Chronic diastolic (congestive) heart failure: Secondary | ICD-10-CM | POA: Diagnosis not present

## 2022-06-11 DIAGNOSIS — J441 Chronic obstructive pulmonary disease with (acute) exacerbation: Secondary | ICD-10-CM | POA: Diagnosis not present

## 2022-06-11 DIAGNOSIS — N1831 Chronic kidney disease, stage 3a: Secondary | ICD-10-CM | POA: Diagnosis not present

## 2022-06-11 DIAGNOSIS — I5032 Chronic diastolic (congestive) heart failure: Secondary | ICD-10-CM | POA: Diagnosis not present

## 2022-06-11 DIAGNOSIS — I130 Hypertensive heart and chronic kidney disease with heart failure and stage 1 through stage 4 chronic kidney disease, or unspecified chronic kidney disease: Secondary | ICD-10-CM | POA: Diagnosis not present

## 2022-06-13 DIAGNOSIS — J441 Chronic obstructive pulmonary disease with (acute) exacerbation: Secondary | ICD-10-CM | POA: Diagnosis not present

## 2022-06-13 DIAGNOSIS — G9331 Postviral fatigue syndrome: Secondary | ICD-10-CM | POA: Diagnosis not present

## 2022-06-13 DIAGNOSIS — I5032 Chronic diastolic (congestive) heart failure: Secondary | ICD-10-CM | POA: Diagnosis not present

## 2022-06-13 DIAGNOSIS — E46 Unspecified protein-calorie malnutrition: Secondary | ICD-10-CM | POA: Diagnosis not present

## 2022-06-13 DIAGNOSIS — I130 Hypertensive heart and chronic kidney disease with heart failure and stage 1 through stage 4 chronic kidney disease, or unspecified chronic kidney disease: Secondary | ICD-10-CM | POA: Diagnosis not present

## 2022-06-13 DIAGNOSIS — N1831 Chronic kidney disease, stage 3a: Secondary | ICD-10-CM | POA: Diagnosis not present

## 2022-06-19 DIAGNOSIS — E46 Unspecified protein-calorie malnutrition: Secondary | ICD-10-CM | POA: Diagnosis not present

## 2022-06-19 DIAGNOSIS — I5032 Chronic diastolic (congestive) heart failure: Secondary | ICD-10-CM | POA: Diagnosis not present

## 2022-06-19 DIAGNOSIS — G9331 Postviral fatigue syndrome: Secondary | ICD-10-CM | POA: Diagnosis not present

## 2022-06-19 DIAGNOSIS — I130 Hypertensive heart and chronic kidney disease with heart failure and stage 1 through stage 4 chronic kidney disease, or unspecified chronic kidney disease: Secondary | ICD-10-CM | POA: Diagnosis not present

## 2022-06-19 DIAGNOSIS — J441 Chronic obstructive pulmonary disease with (acute) exacerbation: Secondary | ICD-10-CM | POA: Diagnosis not present

## 2022-06-19 DIAGNOSIS — N1831 Chronic kidney disease, stage 3a: Secondary | ICD-10-CM | POA: Diagnosis not present

## 2022-06-27 DIAGNOSIS — G9331 Postviral fatigue syndrome: Secondary | ICD-10-CM | POA: Diagnosis not present

## 2022-06-27 DIAGNOSIS — E46 Unspecified protein-calorie malnutrition: Secondary | ICD-10-CM | POA: Diagnosis not present

## 2022-06-27 DIAGNOSIS — J441 Chronic obstructive pulmonary disease with (acute) exacerbation: Secondary | ICD-10-CM | POA: Diagnosis not present

## 2022-06-27 DIAGNOSIS — I5032 Chronic diastolic (congestive) heart failure: Secondary | ICD-10-CM | POA: Diagnosis not present

## 2022-06-27 DIAGNOSIS — N1831 Chronic kidney disease, stage 3a: Secondary | ICD-10-CM | POA: Diagnosis not present

## 2022-06-27 DIAGNOSIS — I130 Hypertensive heart and chronic kidney disease with heart failure and stage 1 through stage 4 chronic kidney disease, or unspecified chronic kidney disease: Secondary | ICD-10-CM | POA: Diagnosis not present

## 2022-07-11 DIAGNOSIS — I480 Paroxysmal atrial fibrillation: Secondary | ICD-10-CM | POA: Diagnosis not present

## 2022-07-11 DIAGNOSIS — M810 Age-related osteoporosis without current pathological fracture: Secondary | ICD-10-CM | POA: Diagnosis not present

## 2022-07-11 DIAGNOSIS — I129 Hypertensive chronic kidney disease with stage 1 through stage 4 chronic kidney disease, or unspecified chronic kidney disease: Secondary | ICD-10-CM | POA: Diagnosis not present

## 2022-07-31 ENCOUNTER — Ambulatory Visit (INDEPENDENT_AMBULATORY_CARE_PROVIDER_SITE_OTHER): Payer: Medicare Other

## 2022-07-31 DIAGNOSIS — I442 Atrioventricular block, complete: Secondary | ICD-10-CM | POA: Diagnosis not present

## 2022-08-01 LAB — CUP PACEART REMOTE DEVICE CHECK
Battery Remaining Longevity: 90 mo
Battery Remaining Percentage: 100 %
Brady Statistic RA Percent Paced: 0 %
Brady Statistic RV Percent Paced: 99 %
Date Time Interrogation Session: 20240320014000
Implantable Lead Connection Status: 753985
Implantable Lead Connection Status: 753985
Implantable Lead Implant Date: 20131211
Implantable Lead Implant Date: 20131211
Implantable Lead Location: 753859
Implantable Lead Location: 753860
Implantable Lead Model: 4135
Implantable Lead Model: 4136
Implantable Lead Serial Number: 29296655
Implantable Lead Serial Number: 29317536
Implantable Pulse Generator Implant Date: 20221205
Lead Channel Impedance Value: 565 Ohm
Lead Channel Impedance Value: 595 Ohm
Lead Channel Setting Pacing Amplitude: 3 V
Lead Channel Setting Pacing Pulse Width: 0.4 ms
Lead Channel Setting Sensing Sensitivity: 3.5 mV
Pulse Gen Serial Number: 646589
Zone Setting Status: 755011

## 2022-08-05 ENCOUNTER — Encounter: Payer: Medicare Other | Admitting: Cardiovascular Disease

## 2022-08-13 ENCOUNTER — Other Ambulatory Visit: Payer: Self-pay | Admitting: *Deleted

## 2022-08-13 DIAGNOSIS — Z950 Presence of cardiac pacemaker: Secondary | ICD-10-CM

## 2022-08-13 DIAGNOSIS — I480 Paroxysmal atrial fibrillation: Secondary | ICD-10-CM

## 2022-08-13 MED ORDER — METOPROLOL SUCCINATE ER 50 MG PO TB24: ORAL_TABLET | ORAL | 3 refills | Status: DC

## 2022-08-15 ENCOUNTER — Ambulatory Visit: Payer: Medicare Other | Attending: Cardiovascular Disease | Admitting: Cardiovascular Disease

## 2022-08-15 ENCOUNTER — Encounter: Payer: Self-pay | Admitting: Cardiovascular Disease

## 2022-08-15 VITALS — BP 120/72 | HR 71 | Ht 61.0 in | Wt 119.8 lb

## 2022-08-15 DIAGNOSIS — E875 Hyperkalemia: Secondary | ICD-10-CM | POA: Diagnosis not present

## 2022-08-15 DIAGNOSIS — E7800 Pure hypercholesterolemia, unspecified: Secondary | ICD-10-CM

## 2022-08-15 DIAGNOSIS — I25118 Atherosclerotic heart disease of native coronary artery with other forms of angina pectoris: Secondary | ICD-10-CM

## 2022-08-15 DIAGNOSIS — I7143 Infrarenal abdominal aortic aneurysm, without rupture: Secondary | ICD-10-CM | POA: Diagnosis not present

## 2022-08-15 DIAGNOSIS — Z950 Presence of cardiac pacemaker: Secondary | ICD-10-CM

## 2022-08-15 DIAGNOSIS — I4821 Permanent atrial fibrillation: Secondary | ICD-10-CM | POA: Diagnosis not present

## 2022-08-15 DIAGNOSIS — I701 Atherosclerosis of renal artery: Secondary | ICD-10-CM

## 2022-08-15 DIAGNOSIS — I5032 Chronic diastolic (congestive) heart failure: Secondary | ICD-10-CM

## 2022-08-15 DIAGNOSIS — I350 Nonrheumatic aortic (valve) stenosis: Secondary | ICD-10-CM | POA: Diagnosis not present

## 2022-08-15 DIAGNOSIS — I352 Nonrheumatic aortic (valve) stenosis with insufficiency: Secondary | ICD-10-CM

## 2022-08-15 DIAGNOSIS — I71012 Dissection of descending thoracic aorta: Secondary | ICD-10-CM

## 2022-08-15 DIAGNOSIS — I7121 Aneurysm of the ascending aorta, without rupture: Secondary | ICD-10-CM

## 2022-08-15 DIAGNOSIS — I442 Atrioventricular block, complete: Secondary | ICD-10-CM | POA: Diagnosis not present

## 2022-08-15 NOTE — Patient Instructions (Addendum)
Medication Instructions:  No changes *If you need a refill on your cardiac medications before your next appointment, please call your pharmacy*  Testing/Procedures: Your physician has requested that you have an echocardiogram. Echocardiography is a painless test that uses sound waves to create images of your heart. It provides your doctor with information about the size and shape of your heart and how well your heart's chambers and valves are working. This procedure takes approximately one hour. There are no restrictions for this procedure. Please do NOT wear cologne, perfume, aftershave, or lotions (deodorant is allowed). Please arrive 15 minutes prior to your appointment time.    Follow-Up: At [facility], you and your health needs are our priority.  As part of our continuing mission to provide you with exceptional heart care, we have created designated Provider Care Teams.  These Care Teams include your primary Cardiologist (physician) and Advanced Practice Providers (APPs -  Physician Assistants and Nurse Practitioners) who all work together to provide you with the care you need, when you need it.  We recommend signing up for the patient portal called "MyChart".  Sign up information is provided on this After Visit Summary.  MyChart is used to connect with patients for Virtual Visits (Telemedicine).  Patients are able to view lab/test results, encounter notes, upcoming appointments, etc.  Non-urgent messages can be sent to your provider as well.   To learn more about what you can do with MyChart, go to [email or web address].    Your next appointment:   6 months- Phys Pacer Check  Provider:   Sanda Klein, MD
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
## 2022-09-05 NOTE — Progress Notes (Signed)
Remote pacemaker transmission.   

## 2022-09-17 ENCOUNTER — Ambulatory Visit (HOSPITAL_COMMUNITY): Payer: Medicare Other | Attending: Cardiology

## 2022-09-17 DIAGNOSIS — I350 Nonrheumatic aortic (valve) stenosis: Secondary | ICD-10-CM | POA: Insufficient documentation

## 2022-09-18 LAB — ECHOCARDIOGRAM COMPLETE
AR max vel: 1.3 cm2
AV Area VTI: 1.29 cm2
AV Area mean vel: 1.18 cm2
AV Mean grad: 14.2 mmHg
AV Peak grad: 24.5 mmHg
Ao pk vel: 2.47 m/s
P 1/2 time: 389 msec
S' Lateral: 1.98 cm

## 2022-09-22 ENCOUNTER — Other Ambulatory Visit: Payer: Self-pay | Admitting: Cardiovascular Disease

## 2022-09-23 DIAGNOSIS — Z0000 Encounter for general adult medical examination without abnormal findings: Secondary | ICD-10-CM | POA: Diagnosis not present

## 2022-09-23 DIAGNOSIS — I129 Hypertensive chronic kidney disease with stage 1 through stage 4 chronic kidney disease, or unspecified chronic kidney disease: Secondary | ICD-10-CM | POA: Diagnosis not present

## 2022-09-23 NOTE — Telephone Encounter (Signed)
Prescription refill request for Pradaxa received.  Indication:afib Last office visit:4/24 Weight:54.3  kg Age:89 Scr:0.8 CrCl:36.86  ml/min  Prescription refilled

## 2022-09-24 LAB — LAB REPORT - SCANNED: EGFR: 55

## 2022-10-09 DIAGNOSIS — H353122 Nonexudative age-related macular degeneration, left eye, intermediate dry stage: Secondary | ICD-10-CM | POA: Diagnosis not present

## 2022-10-09 DIAGNOSIS — Z961 Presence of intraocular lens: Secondary | ICD-10-CM | POA: Diagnosis not present

## 2022-10-09 DIAGNOSIS — H31092 Other chorioretinal scars, left eye: Secondary | ICD-10-CM | POA: Diagnosis not present

## 2022-10-10 DIAGNOSIS — I714 Abdominal aortic aneurysm, without rupture, unspecified: Secondary | ICD-10-CM | POA: Diagnosis not present

## 2022-10-10 DIAGNOSIS — I130 Hypertensive heart and chronic kidney disease with heart failure and stage 1 through stage 4 chronic kidney disease, or unspecified chronic kidney disease: Secondary | ICD-10-CM | POA: Diagnosis not present

## 2022-10-10 DIAGNOSIS — I10 Essential (primary) hypertension: Secondary | ICD-10-CM | POA: Diagnosis not present

## 2022-10-10 DIAGNOSIS — I700 Atherosclerosis of aorta: Secondary | ICD-10-CM | POA: Diagnosis not present

## 2022-10-10 DIAGNOSIS — I129 Hypertensive chronic kidney disease with stage 1 through stage 4 chronic kidney disease, or unspecified chronic kidney disease: Secondary | ICD-10-CM | POA: Diagnosis not present

## 2022-10-10 DIAGNOSIS — N1831 Chronic kidney disease, stage 3a: Secondary | ICD-10-CM | POA: Diagnosis not present

## 2022-10-10 DIAGNOSIS — I7102 Dissection of abdominal aorta: Secondary | ICD-10-CM | POA: Diagnosis not present

## 2022-10-10 DIAGNOSIS — I2081 Angina pectoris with coronary microvascular dysfunction: Secondary | ICD-10-CM | POA: Diagnosis not present

## 2022-10-10 DIAGNOSIS — J432 Centrilobular emphysema: Secondary | ICD-10-CM | POA: Diagnosis not present

## 2022-10-10 DIAGNOSIS — D6869 Other thrombophilia: Secondary | ICD-10-CM | POA: Diagnosis not present

## 2022-10-10 DIAGNOSIS — I5030 Unspecified diastolic (congestive) heart failure: Secondary | ICD-10-CM | POA: Diagnosis not present

## 2022-10-30 ENCOUNTER — Ambulatory Visit (INDEPENDENT_AMBULATORY_CARE_PROVIDER_SITE_OTHER): Payer: Medicare Other

## 2022-10-30 DIAGNOSIS — I4821 Permanent atrial fibrillation: Secondary | ICD-10-CM | POA: Diagnosis not present

## 2022-10-30 DIAGNOSIS — C44622 Squamous cell carcinoma of skin of right upper limb, including shoulder: Secondary | ICD-10-CM | POA: Diagnosis not present

## 2022-10-30 DIAGNOSIS — L03011 Cellulitis of right finger: Secondary | ICD-10-CM | POA: Diagnosis not present

## 2022-10-31 LAB — CUP PACEART REMOTE DEVICE CHECK
Battery Remaining Longevity: 84 mo
Battery Remaining Percentage: 100 %
Brady Statistic RA Percent Paced: 0 %
Brady Statistic RV Percent Paced: 99 %
Date Time Interrogation Session: 20240618142000
Implantable Lead Connection Status: 753985
Implantable Lead Connection Status: 753985
Implantable Lead Implant Date: 20131211
Implantable Lead Implant Date: 20131211
Implantable Lead Location: 753859
Implantable Lead Location: 753860
Implantable Lead Model: 4135
Implantable Lead Model: 4136
Implantable Lead Serial Number: 29296655
Implantable Lead Serial Number: 29317536
Implantable Pulse Generator Implant Date: 20221205
Lead Channel Impedance Value: 561 Ohm
Lead Channel Impedance Value: 611 Ohm
Lead Channel Setting Pacing Amplitude: 3 V
Lead Channel Setting Pacing Pulse Width: 0.4 ms
Lead Channel Setting Sensing Sensitivity: 3.5 mV
Pulse Gen Serial Number: 646589
Zone Setting Status: 755011

## 2022-12-30 DIAGNOSIS — L570 Actinic keratosis: Secondary | ICD-10-CM | POA: Diagnosis not present

## 2022-12-30 DIAGNOSIS — Z85828 Personal history of other malignant neoplasm of skin: Secondary | ICD-10-CM | POA: Diagnosis not present

## 2022-12-30 DIAGNOSIS — D0361 Melanoma in situ of right upper limb, including shoulder: Secondary | ICD-10-CM | POA: Diagnosis not present

## 2022-12-30 DIAGNOSIS — L03011 Cellulitis of right finger: Secondary | ICD-10-CM | POA: Diagnosis not present

## 2022-12-30 DIAGNOSIS — D492 Neoplasm of unspecified behavior of bone, soft tissue, and skin: Secondary | ICD-10-CM | POA: Diagnosis not present

## 2022-12-30 DIAGNOSIS — Z08 Encounter for follow-up examination after completed treatment for malignant neoplasm: Secondary | ICD-10-CM | POA: Diagnosis not present

## 2023-01-23 ENCOUNTER — Encounter: Payer: Self-pay | Admitting: Cardiovascular Disease

## 2023-01-23 ENCOUNTER — Ambulatory Visit: Payer: Medicare Other | Attending: Cardiovascular Disease | Admitting: Cardiovascular Disease

## 2023-01-23 VITALS — BP 106/66 | HR 70 | Ht 61.0 in | Wt 113.6 lb

## 2023-01-23 DIAGNOSIS — I701 Atherosclerosis of renal artery: Secondary | ICD-10-CM | POA: Diagnosis not present

## 2023-01-23 DIAGNOSIS — Z950 Presence of cardiac pacemaker: Secondary | ICD-10-CM | POA: Diagnosis not present

## 2023-01-23 DIAGNOSIS — E7800 Pure hypercholesterolemia, unspecified: Secondary | ICD-10-CM

## 2023-01-23 DIAGNOSIS — I25118 Atherosclerotic heart disease of native coronary artery with other forms of angina pectoris: Secondary | ICD-10-CM

## 2023-01-23 DIAGNOSIS — I352 Nonrheumatic aortic (valve) stenosis with insufficiency: Secondary | ICD-10-CM | POA: Diagnosis not present

## 2023-01-23 DIAGNOSIS — I4821 Permanent atrial fibrillation: Secondary | ICD-10-CM

## 2023-01-23 DIAGNOSIS — I4819 Other persistent atrial fibrillation: Secondary | ICD-10-CM | POA: Diagnosis not present

## 2023-01-23 DIAGNOSIS — I5032 Chronic diastolic (congestive) heart failure: Secondary | ICD-10-CM

## 2023-01-23 DIAGNOSIS — I442 Atrioventricular block, complete: Secondary | ICD-10-CM | POA: Diagnosis not present

## 2023-01-23 DIAGNOSIS — I7121 Aneurysm of the ascending aorta, without rupture: Secondary | ICD-10-CM | POA: Diagnosis not present

## 2023-01-23 DIAGNOSIS — I7143 Infrarenal abdominal aortic aneurysm, without rupture: Secondary | ICD-10-CM

## 2023-01-23 DIAGNOSIS — I71012 Dissection of descending thoracic aorta: Secondary | ICD-10-CM | POA: Diagnosis not present

## 2023-01-23 DIAGNOSIS — E875 Hyperkalemia: Secondary | ICD-10-CM | POA: Diagnosis not present

## 2023-01-23 DIAGNOSIS — D6869 Other thrombophilia: Secondary | ICD-10-CM

## 2023-01-23 DIAGNOSIS — I150 Renovascular hypertension: Secondary | ICD-10-CM | POA: Diagnosis not present
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
## 2023-01-23 NOTE — Patient Instructions (Signed)
Medication Instructions:  No changes *If you need a refill on your cardiac medications before your next appointment, please call your pharmacy*   Follow-Up: At [facility], you and your health needs are our priority.  As part of our continuing mission to provide you with exceptional heart care, we have created designated Provider Care Teams.  These Care Teams include your primary Cardiologist (physician) and Advanced Practice Providers (APPs -  Physician Assistants and Nurse Practitioners) who all work together to provide you with the care you need, when you need it.  We recommend signing up for the patient portal called "MyChart".  Sign up information is provided on this After Visit Summary.  MyChart is used to connect with patients for Virtual Visits (Telemedicine).  Patients are able to view lab/test results, encounter notes, upcoming appointments, etc.  Non-urgent messages can be sent to your provider as well.   To learn more about what you can do with MyChart, go to [email or web address].    Your next appointment:   6 month(s)  Provider:   Mihai Croitoru, MD     

## 2023-01-25 ENCOUNTER — Other Ambulatory Visit: Payer: Self-pay | Admitting: Cardiovascular Disease

## 2023-01-25 DIAGNOSIS — I4811 Longstanding persistent atrial fibrillation: Secondary | ICD-10-CM

## 2023-01-27 ENCOUNTER — Other Ambulatory Visit: Payer: Self-pay | Admitting: *Deleted

## 2023-01-27 MED ORDER — ISOSORBIDE MONONITRATE ER 30 MG PO TB24: 30.0000 mg | ORAL_TABLET | Freq: Every day | ORAL | 3 refills | Status: DC

## 2023-01-27 NOTE — Telephone Encounter (Signed)
Pradaxa 75mg  refill request received. Pt is 89 years old, weight-51.5 kg, Crea-0.88 on 05/10/22, last seen by Dr. Royann Shivers on 01/23/23, Diagnosis-Afib, CrCl-31.09 mL/min; Dose is appropriate based on dosing criteria.

## 2023-01-29 ENCOUNTER — Ambulatory Visit: Payer: Medicare Other

## 2023-01-29 DIAGNOSIS — I442 Atrioventricular block, complete: Secondary | ICD-10-CM

## 2023-01-29 DIAGNOSIS — S139XXA Sprain of joints and ligaments of unspecified parts of neck, initial encounter: Secondary | ICD-10-CM | POA: Diagnosis not present

## 2023-01-30 NOTE — Telephone Encounter (Signed)
Pt agreed to send missed transmission.

## 2023-01-31 LAB — CUP PACEART REMOTE DEVICE CHECK
Battery Remaining Longevity: 78 mo
Battery Remaining Percentage: 100 %
Brady Statistic RA Percent Paced: 0 %
Brady Statistic RV Percent Paced: 99 %
Date Time Interrogation Session: 20240919134500
Implantable Lead Connection Status: 753985
Implantable Lead Connection Status: 753985
Implantable Lead Implant Date: 20131211
Implantable Lead Implant Date: 20131211
Implantable Lead Location: 753859
Implantable Lead Location: 753860
Implantable Lead Model: 4135
Implantable Lead Model: 4136
Implantable Lead Serial Number: 29296655
Implantable Lead Serial Number: 29317536
Implantable Pulse Generator Implant Date: 20221205
Lead Channel Impedance Value: 614 Ohm
Lead Channel Impedance Value: 647 Ohm
Lead Channel Setting Pacing Amplitude: 3 V
Lead Channel Setting Pacing Pulse Width: 0.4 ms
Lead Channel Setting Sensing Sensitivity: 3.5 mV
Pulse Gen Serial Number: 646589
Zone Setting Status: 755011

## 2023-02-04 NOTE — Progress Notes (Signed)
Remote pacemaker transmission.   

## 2023-02-09 ENCOUNTER — Other Ambulatory Visit: Payer: Self-pay

## 2023-02-09 ENCOUNTER — Emergency Department (HOSPITAL_COMMUNITY): Payer: Medicare Other

## 2023-02-09 ENCOUNTER — Emergency Department (HOSPITAL_COMMUNITY)
Admission: EM | Admit: 2023-02-09 | Discharge: 2023-02-09 | Disposition: A | Discharge status: Home / Self Care | Payer: Medicare Other | Source: Home / Self Care | Attending: Emergency Medicine | Admitting: Emergency Medicine

## 2023-02-09 DIAGNOSIS — N281 Cyst of kidney, acquired: Secondary | ICD-10-CM | POA: Diagnosis not present

## 2023-02-09 DIAGNOSIS — R7989 Other specified abnormal findings of blood chemistry: Secondary | ICD-10-CM | POA: Insufficient documentation

## 2023-02-09 DIAGNOSIS — Z8673 Personal history of transient ischemic attack (TIA), and cerebral infarction without residual deficits: Secondary | ICD-10-CM | POA: Insufficient documentation

## 2023-02-09 DIAGNOSIS — I2510 Atherosclerotic heart disease of native coronary artery without angina pectoris: Secondary | ICD-10-CM | POA: Insufficient documentation

## 2023-02-09 DIAGNOSIS — R1084 Generalized abdominal pain: Secondary | ICD-10-CM | POA: Diagnosis not present

## 2023-02-09 DIAGNOSIS — J449 Chronic obstructive pulmonary disease, unspecified: Secondary | ICD-10-CM | POA: Insufficient documentation

## 2023-02-09 DIAGNOSIS — I7143 Infrarenal abdominal aortic aneurysm, without rupture: Secondary | ICD-10-CM | POA: Diagnosis not present

## 2023-02-09 DIAGNOSIS — R059 Cough, unspecified: Secondary | ICD-10-CM | POA: Diagnosis not present

## 2023-02-09 DIAGNOSIS — Z7951 Long term (current) use of inhaled steroids: Secondary | ICD-10-CM | POA: Insufficient documentation

## 2023-02-09 DIAGNOSIS — N3000 Acute cystitis without hematuria: Secondary | ICD-10-CM | POA: Insufficient documentation

## 2023-02-09 DIAGNOSIS — I10 Essential (primary) hypertension: Secondary | ICD-10-CM | POA: Diagnosis not present

## 2023-02-09 DIAGNOSIS — I714 Abdominal aortic aneurysm, without rupture, unspecified: Secondary | ICD-10-CM | POA: Insufficient documentation

## 2023-02-09 DIAGNOSIS — K5730 Diverticulosis of large intestine without perforation or abscess without bleeding: Secondary | ICD-10-CM | POA: Diagnosis not present

## 2023-02-09 DIAGNOSIS — Z66 Do not resuscitate: Secondary | ICD-10-CM | POA: Insufficient documentation

## 2023-02-09 DIAGNOSIS — I7121 Aneurysm of the ascending aorta, without rupture: Secondary | ICD-10-CM | POA: Diagnosis not present

## 2023-02-09 LAB — CBC WITH DIFFERENTIAL/PLATELET
Abs Immature Granulocytes: 0.15 10*3/uL — ABNORMAL HIGH (ref 0.00–0.07)
Basophils Absolute: 0 10*3/uL (ref 0.0–0.1)
Basophils Relative: 0 %
Eosinophils Absolute: 0 10*3/uL (ref 0.0–0.5)
Eosinophils Relative: 0 %
HCT: 47.8 % — ABNORMAL HIGH (ref 36.0–46.0)
Hemoglobin: 15.6 g/dL — ABNORMAL HIGH (ref 12.0–15.0)
Immature Granulocytes: 1 %
Lymphocytes Relative: 6 %
Lymphs Abs: 1.1 10*3/uL (ref 0.7–4.0)
MCH: 30.5 pg (ref 26.0–34.0)
MCHC: 32.6 g/dL (ref 30.0–36.0)
MCV: 93.5 fL (ref 80.0–100.0)
Monocytes Absolute: 1.7 10*3/uL — ABNORMAL HIGH (ref 0.1–1.0)
Monocytes Relative: 9 %
Neutro Abs: 15.7 10*3/uL — ABNORMAL HIGH (ref 1.7–7.7)
Neutrophils Relative %: 84 %
Platelets: 170 10*3/uL (ref 150–400)
RBC: 5.11 MIL/uL (ref 3.87–5.11)
RDW: 14 % (ref 11.5–15.5)
WBC: 18.7 10*3/uL — ABNORMAL HIGH (ref 4.0–10.5)
nRBC: 0 % (ref 0.0–0.2)

## 2023-02-09 LAB — URINALYSIS, W/ REFLEX TO CULTURE (INFECTION SUSPECTED)
Bilirubin Urine: NEGATIVE
Glucose, UA: NEGATIVE mg/dL
Ketones, ur: NEGATIVE mg/dL
Nitrite: NEGATIVE
Protein, ur: NEGATIVE mg/dL
Specific Gravity, Urine: 1.042 — ABNORMAL HIGH (ref 1.005–1.030)
pH: 6 (ref 5.0–8.0)

## 2023-02-09 LAB — COMPREHENSIVE METABOLIC PANEL
ALT: 59 U/L — ABNORMAL HIGH (ref 0–44)
AST: 52 U/L — ABNORMAL HIGH (ref 15–41)
Albumin: 3.7 g/dL (ref 3.5–5.0)
Alkaline Phosphatase: 142 U/L — ABNORMAL HIGH (ref 38–126)
Anion gap: 16 — ABNORMAL HIGH (ref 5–15)
BUN: 28 mg/dL — ABNORMAL HIGH (ref 8–23)
CO2: 23 mmol/L (ref 22–32)
Calcium: 9.3 mg/dL (ref 8.9–10.3)
Chloride: 97 mmol/L — ABNORMAL LOW (ref 98–111)
Creatinine, Ser: 0.87 mg/dL (ref 0.44–1.00)
GFR, Estimated: >60 mL/min (ref >60)
Glucose, Bld: 121 mg/dL — ABNORMAL HIGH (ref 70–99)
Potassium: 3.9 mmol/L (ref 3.5–5.1)
Sodium: 136 mmol/L (ref 135–145)
Total Bilirubin: 1 mg/dL (ref 0.3–1.2)
Total Protein: 6.6 g/dL (ref 6.5–8.1)

## 2023-02-09 LAB — I-STAT CHEM 8, ED
BUN: 31 mg/dL — ABNORMAL HIGH (ref 8–23)
Calcium, Ion: 1.07 mmol/L — ABNORMAL LOW (ref 1.15–1.40)
Chloride: 99 mmol/L (ref 98–111)
Creatinine, Ser: 0.9 mg/dL (ref 0.44–1.00)
Glucose, Bld: 119 mg/dL — ABNORMAL HIGH (ref 70–99)
HCT: 49 % — ABNORMAL HIGH (ref 36.0–46.0)
Hemoglobin: 16.7 g/dL — ABNORMAL HIGH (ref 12.0–15.0)
Potassium: 3.7 mmol/L (ref 3.5–5.1)
Sodium: 136 mmol/L (ref 135–145)
TCO2: 25 mmol/L (ref 22–32)

## 2023-02-09 LAB — LIPASE, BLOOD: Lipase: 30 U/L (ref 11–51)

## 2023-02-09 MED ORDER — FENTANYL CITRATE PF 50 MCG/ML IJ SOSY
50.0000 ug | PREFILLED_SYRINGE | Freq: Once | INTRAMUSCULAR | Status: AC
  Administered 2023-02-09: 50 ug via INTRAVENOUS
  Filled 2023-02-09: qty 1

## 2023-02-09 MED ORDER — SODIUM CHLORIDE 0.9 % IV SOLN
1.0000 g | Freq: Once | INTRAVENOUS | Status: AC
  Administered 2023-02-09: 1 g via INTRAVENOUS
  Filled 2023-02-09: qty 10

## 2023-02-09 MED ORDER — IOHEXOL 350 MG/ML SOLN
80.0000 mL | Freq: Once | INTRAVENOUS | Status: AC | PRN
  Administered 2023-02-09: 80 mL via INTRAVENOUS

## 2023-02-09 MED ORDER — OXYCODONE-ACETAMINOPHEN 5-325 MG PO TABS: 1.0000 | ORAL_TABLET | ORAL | 0 refills | Status: DC | PRN

## 2023-02-09 MED ORDER — CEPHALEXIN 500 MG PO CAPS: 500.0000 mg | ORAL_CAPSULE | Freq: Two times a day (BID) | ORAL | 0 refills | Status: DC

## 2023-02-09 MED ORDER — ONDANSETRON HCL 4 MG/2ML IJ SOLN
4.0000 mg | Freq: Once | INTRAMUSCULAR | Status: AC
  Administered 2023-02-09: 4 mg via INTRAVENOUS
  Filled 2023-02-09: qty 2

## 2023-02-09 NOTE — ED Notes (Signed)
Patient transported to CT 

## 2023-02-09 NOTE — ED Notes (Signed)
Xray tech at bedside.

## 2023-02-09 NOTE — ED Notes (Signed)
Pt returned to room from CT

## 2023-02-09 NOTE — Discharge Instructions (Signed)
As we discussed, AAA has increased in size from prior.  This increases risk for potential rupture.  Should this rupture, it will be fatal. As you do not want any intervention for this, do feel it is reasonable for you to go home. I have written prescriptions for pain medication along with antibiotics as urine did show signs of infection today. Recommend that you follow-up closely with your primary care doctor. Return here for new concerns.

## 2023-02-09 NOTE — ED Provider Notes (Signed)
[place] EMERGENCY DEPARTMENT AT [facility] Provider Note   CSN: [id number] Arrival date & time: 02/09/23  0033     History  Chief Complaint  Patient presents with   Abdominal Pain    Since yesterday. Getting worse. Radiating to back. EMS reports hx of AAA. Pt denies nausea and vomiting. Denies urinary symptoms.     [name] is a 89 y.o. female.  The history is provided by the patient and medical records.  Abdominal Pain  89 year old female with history of hypertension, GERD, known AAA, CAD, COPD, prior stroke, presenting to the ED with abdominal pain.  Began yesterday around 3 PM, has been worsening.  Pain all throughout mid abdomen with some radiation to the back.  Also feels it somewhat into the right groin.  She denies any nausea or vomiting.  No fever or chills.  She does have a known AAA, last imaged on 02/04/2021 in the ED.  Her AAA did increase in size at that point but there was no signs of impending rupture.  She previously followed with vascular surgery, Dr. Randie Heinz, however has not seen him in a few years.  Patient is DNR, paperwork at bedside.  Home Medications Prior to Admission medications   Medication Sig Start Date End Date Taking? Authorizing Provider  acetaminophen (TYLENOL) 500 MG tablet Take 1,000 mg by mouth every 6 (six) hours as needed for mild pain, moderate pain or headache.    [provider]  albuterol (VENTOLIN HFA) 108 (90 Base) MCG/ACT inhaler Inhale 2 puffs into the lungs every 6 (six) hours as needed for wheezing or shortness of breath. Patient not taking: Reported on 01/23/2023 05/10/22   Jerald Kief, MD  busPIRone (BUSPAR) 5 MG tablet Take 5 mg by mouth 2 (two) times daily. Start 10/17 take twice daily for 30 days.    [provider]  Calcium Carbonate-Vitamin D (CALCIUM + D PO) Take 1 tablet by mouth 2 (two) times daily.    [provider]  dabigatran (PRADAXA) 75 MG CAPS capsule TAKE 1 CAPSULE(75 MG) BY  MOUTH EVERY 12 HOURS 01/27/23   Croitoru, Mihai, MD  diltiazem (CARDIZEM CD) 120 MG 24 hr capsule Take 120 mg by mouth at bedtime. Start 02/26/22, take for 30 days per Dr. Nicholos Johns    [provider]  dorzolamide (TRUSOPT) 2 % ophthalmic solution Place 1 drop into the right eye 2 (two) times daily as needed (pain). 04/01/22   [provider]  fluticasone (FLONASE) 50 MCG/ACT nasal spray Place 1 spray into both nostrils daily as needed for allergies or rhinitis. Patient not taking: Reported on 01/23/2023    [provider]  furosemide (LASIX) 40 MG tablet Take 40 mg every morning 04/18/22   Croitoru, Mihai, MD  isosorbide mononitrate (IMDUR) 30 MG 24 hr tablet Take 1 tablet (30 mg total) by mouth daily. 01/27/23   Croitoru, Mihai, MD  magnesium oxide (MAG-OX) 400 (241.3 Mg) MG tablet Take 1 tablet (400 mg total) by mouth daily. 08/18/17   Croitoru, Mihai, MD  meclizine (ANTIVERT) 12.5 MG tablet Take 1 tablet (12.5 mg total) by mouth 3 (three) times daily as needed for dizziness. Patient not taking: Reported on 01/23/2023 10/14/21   Pokhrel, Rebekah [place], MD  metoprolol succinate (TOPROL-XL) 50 MG 24 hr tablet TAKE 1 TABLET BY MOUTH TWICE DAILY. TAKE WITH OR IMMEDIATELY FOLLOWING A MEAL 08/13/22   Croitoru, Mihai, MD  Multiple Vitamin (MULTIVITAMIN WITH MINERALS) TABS tablet Take 1 tablet by mouth daily.  [provider]  Multiple Vitamins-Minerals (PRESERVISION AREDS 2) CAPS Take 1 capsule by mouth 2 (two) times daily.     [provider]  nitroGLYCERIN (NITROSTAT) 0.4 MG SL tablet Place 1 tablet (0.4 mg total) under the tongue every 5 (five) minutes as needed for chest pain. DO NOT TAKE MORE THAN 3 DOSES WITHIN 15 MINS Patient not taking: Reported on 01/23/2023 01/08/21   Croitoru, Mihai, MD  Omega-3 Fatty Acids (FISH OIL) 1000 MG CAPS Take 1,000 mg by mouth daily.    [provider]  pantoprazole (PROTONIX) 40 MG tablet Take 40 mg by mouth daily.    [provider]  Polyethyl Glycol-Propyl Glycol (SYSTANE OP) Place 2 drops into both eyes 2 (two) times daily as needed (for dry eyes).    [provider]  rosuvastatin (CRESTOR) 5 MG tablet Take 5 mg by mouth daily. 04/04/21   [provider]      Allergies    Tramadol and Protonix [pantoprazole]    Review of Systems   Review of Systems  Gastrointestinal:  Positive for abdominal pain.  All other systems reviewed and are negative.   Physical Exam Updated Vital Signs BP (!) 156/101   Pulse 69   Resp 19   Ht 5\' 2"  (1.575 m)   Wt 53.1 kg   SpO2 100%   BMI 21.40 kg/m   Physical Exam Vitals and nursing note reviewed.  Constitutional:      Appearance: She is well-developed.     Comments: Appears uncomfortable, writhing a bit  HENT:     Head: Normocephalic and atraumatic.  Eyes:     Conjunctiva/sclera: Conjunctivae normal.     Pupils: Pupils are equal, round, and reactive to light.  Cardiovascular:     Rate and Rhythm: Normal rate and regular rhythm.     Heart sounds: Normal heart sounds.  Pulmonary:     Effort: Pulmonary effort is normal.     Breath sounds: Normal breath sounds.  Abdominal:     General: Bowel sounds are normal.     Palpations: Abdomen is soft.     Tenderness: There is generalized abdominal tenderness.  Musculoskeletal:        General: Normal range of motion.     Cervical back: Normal range of motion.  Skin:    General: Skin is warm and dry.  Neurological:     Mental Status: She is alert and oriented to person, place, and time.     ED Results / Procedures / Treatments   Labs (all labs ordered are listed, but only abnormal results are displayed) Labs Reviewed  CBC WITH DIFFERENTIAL/PLATELET - Abnormal; Notable for the following components:      Result Value   WBC 18.7 (*)    Hemoglobin 15.6 (*)    HCT 47.8 (*)    Neutro Abs 15.7 (*)    Monocytes Absolute 1.7 (*)    Abs Immature Granulocytes 0.15 (*)    All other components  within normal limits  COMPREHENSIVE METABOLIC PANEL - Abnormal; Notable for the following components:   Chloride 97 (*)    Glucose, Bld 121 (*)    BUN 28 (*)    AST 52 (*)    ALT 59 (*)    Alkaline Phosphatase 142 (*)    Anion gap 16 (*)    All other components within normal limits  URINALYSIS, W/ REFLEX TO CULTURE (INFECTION SUSPECTED) - Abnormal; Notable for the following components:   Specific Gravity, Urine  1.042 (*)    Hgb urine dipstick SMALL (*)    Leukocytes,Ua SMALL (*)    Bacteria, UA MANY (*)    All other components within normal limits  I-STAT CHEM 8, ED - Abnormal; Notable for the following components:   BUN 31 (*)    Glucose, Bld 119 (*)    Calcium, Ion 1.07 (*)    Hemoglobin 16.7 (*)    HCT 49.0 (*)    All other components within normal limits  URINE CULTURE  LIPASE, BLOOD    EKG None  Radiology CT Angio Chest/Abd/Pel for Dissection W and/or Wo Contrast  Result Date: 02/09/2023 CLINICAL DATA:  Acute aortic syndrome suspected. Abdominal and back pain in a patient with known abdominal aortic aneurysm. EXAM: CT ANGIOGRAPHY CHEST, ABDOMEN AND PELVIS TECHNIQUE: Non-contrast CT of the chest was initially obtained. Multidetector CT imaging through the chest, abdomen and pelvis was performed using the standard protocol during bolus administration of intravenous contrast. Multiplanar reconstructed images and MIPs were obtained and reviewed to evaluate the vascular anatomy. RADIATION DOSE REDUCTION: This exam was performed according to the departmental dose-optimization program which includes automated exposure control, adjustment of the mA and/or kV according to patient size and/or use of iterative reconstruction technique. CONTRAST:  80mL OMNIPAQUE IOHEXOL 350 MG/ML SOLN COMPARISON:  Chest radiograph 02/09/2023. CT chest abdomen and pelvis 02/04/2021 FINDINGS: CTA CHEST FINDINGS Cardiovascular: Unenhanced images of the chest demonstrate diffuse calcification of the aorta.  Calcification of coronary arteries. Calcification in the mitral valve annulus. No evidence of acute intramural hematoma. Images obtained during arterial phase after intravenous contrast material was administered demonstrate an ascending thoracic aortic aneurysm measuring 4.4 cm diameter. No significant change. No aortic dissection. Great vessel origins are patent. Cardiac enlargement with particular prominence of the right heart and right atrium. Reflux of contrast material into the renal veins and IVC suggest right heart failure. No pericardial effusion. Central pulmonary arteries are patent without evidence of significant pulmonary embolus. Cardiac pacemaker. Mediastinum/Nodes: Esophagus is decompressed. Thyroid gland is unremarkable. No significant lymphadenopathy. There are posterior paraspinal masses bilaterally at the level of T9 and on the left at the level of T10. These are unchanged since prior study. Possible etiologies could include nerve sheath tumor or ganglion cysts. Lesions have been present without change at least dating back to CT of 12/05/2017. Long-term stability suggests benign etiology and no radiographic follow-up is indicated. Lungs/Pleura: Lungs are clear. No pleural effusion or pneumothorax. Musculoskeletal: Degenerative changes in the spine. No acute bony abnormalities. Review of the MIP images confirms the above findings. CTA ABDOMEN AND PELVIS FINDINGS VASCULAR Aorta: Limited opacification of the abdominal aorta likely due to right heart failure. The infrarenal abdominal aortic aneurysm is again demonstrated with maximal AP diameter measuring 7.8 cm in maximal transverse diameter of 7.6 cm. This is increased from a maximal AP diameter of 5.7 cm on the prior study. Significant interval enlargement warrants vascular consultation. No retroperitoneal hematoma is identified currently. Celiac: Patency can not be established due to poor contrast bolus. SMA: Patency can not be established due to  poor contrast bolus. Renals: Patency can not be established due to poor contrast bolus. IMA: Patency can not be established due to poor contrast bolus. Inflow: Patency can not be established due to poor contrast bolus. Veins: Focal narrowing of the lower inferior vena cava due to extrinsic compression from the aortic aneurysm. The vessel remains patent throughout. Review of the MIP images confirms the above findings. NON-VASCULAR Hepatobiliary: No focal liver  abnormality is seen. Status post cholecystectomy. No biliary dilatation. Pancreas: Unremarkable. No pancreatic ductal dilatation or surrounding inflammatory changes. Spleen: Normal in size without focal abnormality. Adrenals/Urinary Tract: No adrenal gland nodules. No hydronephrosis or hydroureter in the kidneys. Peripherally calcified heterogeneous density mass in the left kidney measuring 2.8 cm diameter. No change since prior study. Subcentimeter hemorrhagic cyst is seen on the left kidney. No imaging follow-up is indicated based on stability and patient age. Stomach/Bowel: Stomach, small bowel, and colon are not abnormally distended. No wall thickening or inflammatory changes. Colonic diverticulosis without evidence of acute diverticulitis. Appendix is not identified. Lymphatic: No significant lymphadenopathy. Reproductive: Uterus and bilateral adnexa are unremarkable. Other: No abdominal wall hernia or abnormality. No abdominopelvic ascites. Musculoskeletal: Old vertebral compression and kyphoplasty changes at L3. Degenerative changes in the spine. Old pubic ramus fractures. No acute bony abnormalities. Review of the MIP images confirms the above findings. IMPRESSION: 1. Ascending aortic aneurysm measuring 4.4 cm diameter. No change since prior study. Recommend annual imaging followup by CTA or MRA. This recommendation follows 2010 ACCF/AHA/AATS/ACR/ASA/SCA/SCAI/SIR/STS/SVM Guidelines for the Diagnosis and Management of Patients with Thoracic Aortic  Disease. Circulation. 2010; 121: Z610-R604. Aortic aneurysm NOS (ICD10-I71.9) 2. Abdominal aortic aneurysm measuring 7.8 cm AP diameter, compared to 6.2 cm previously. No evidence of acute rupture but significant enlargement since the prior study is demonstrated in the setting of back pain, possibility of impending rupture should be considered. Recommend referral to or continued care with vascular specialist. (Ref.: J Vasc Surg. 2018; 67:2-77 and J Am Coll Radiol 2013;10(10):789-794.) 3. Cardiac enlargement with evidence of right heart failure. Right heart failure results in delayed contrast bolus to the abdominal aorta and branch vessels, limiting visualization as above. 4. Multiple incidental findings detailed above including unchanged paraspinal and left renal mass lesions. Long-term stability suggests benign etiology and no specific imaging follow-up is indicated. These results were called by telephone at the time of interpretation on 02/09/2023 at 1:52 am to provider [facility] , who verbally acknowledged these results. Electronically Signed   By: Burman Nieves M.D.   On: 02/09/2023 02:00   DG Chest Port 1 View  Result Date: 02/09/2023 CLINICAL DATA:  Cough EXAM: PORTABLE CHEST 1 VIEW COMPARISON:  05/08/2022 FINDINGS: Cardiac shadow is mildly prominent but stable. Pacing device is again seen. Lungs are well aerated without focal infiltrate. No bony abnormality is seen. IMPRESSION: No acute abnormality noted Electronically Signed   By: Alcide Clever M.D.   On: 02/09/2023 01:09    Procedures Procedures    Medications Ordered in ED Medications  cefTRIAXone (ROCEPHIN) 1 g in sodium chloride 0.9 % 100 mL IVPB (1 g Intravenous New Bag/Given 02/09/23 0410)  fentaNYL (SUBLIMAZE) injection 50 mcg (50 mcg Intravenous Given 02/09/23 0100)  ondansetron (ZOFRAN) injection 4 mg (4 mg Intravenous Given 02/09/23 0058)  iohexol (OMNIPAQUE) 350 MG/ML injection 80 mL (80 mLs Intravenous Contrast Given 02/09/23 0125)     ED Course/ Medical Decision Making/ A&P                                 Medical Decision Making Amount and/or Complexity of Data Reviewed Labs: ordered. Radiology: ordered and independent interpretation performed. ECG/medicine tests: ordered and independent interpretation performed.  Risk Prescription drug management.   89 year old female presenting to the ED with abdominal pain.  She has a known AAA.  Began having mid abdominal pain yesterday with radiation into the back and  somewhat into the right groin.  She is afebrile, nontoxic.  HD stable.  She does appear uncomfortable on exam.  Given her history, will obtain CTA chest/abdomen/pelvis.  Labs pending along with CXR.    Labs as above-- WBC count 18.7.  CMP with mild elevation of LFT's and alk phos.  She did taken 2 tylenol PTA, given to her by daughter in law.  Lipase WNL.  UA pending.  Call from radiology regarding CTA-- increased size of AAA, consider impending rupture given her pain.    2:16 AM Extensive conversation with patient and family.  Her AAA now measuring 7.8 cm, previously measuring 6.2 in 2022.  Given her pain, consider impending rupture.  Patient previously expressed that she would not want any kind of surgical procedure on this, was previously seeing Dr. Randie Heinz but stopped in 2022 after she made this decision.  She does not feel like she would want any kind of elective repair now, nor attempted emergent repair should it rupture-- rather she would just want to be made comfortable.  Patient expresses she honestly wants to go home but also does not want to be in pain.  Family at bedside wants to talk with her privately, will let us know ultimate decision regarding admission or not.  Family and patient have collectively decided they do not want any acute intervention or potential surgical repair.  They have all expressed understanding that should AAA rupture, this will be fatal.  Patient has made it abundantly clear that she  is 89 and has lived a  long life, she is adamant that she would not want any type of intervention.  She request to be discharged home with pain control.  I think this is reasonable given she is not desiring any type of intervention.  UA here does appear infectious with many bacteria.  She was given dose of IV Rocephin here but still feel it is reasonable to have her go home on oral antibiotics.  She is currently pain free after single dose of fentanyl.  Family is in agreement with plan to d/c.  Encouraged to follow-up with PCP.  Can return here for any new or acute changes.  Shared visit with attending physician, Dr. Erik Obey with assessment and plan of care.  Final Clinical Impression(s) / ED Diagnoses Final diagnoses:  Abdominal aortic aneurysm (AAA) without rupture, unspecified part (HCC)  Acute cystitis without hematuria    Rx / DC Orders ED Discharge Orders     None         Garlon Hatchet, PA-C 02/09/23 0445    Tilden Fossa, MD 02/10/23 [id number]

## 2023-02-09 NOTE — ED Notes (Addendum)
ED Provider back at bedside. 

## 2023-02-09 NOTE — ED Notes (Signed)
Called CT. Pt is next for scanner 4.

## 2023-02-09 NOTE — ED Notes (Signed)
Pt and family educated to take antibiotics for entire tx regimen. No driving or lifting any heavy equipment while taking narcotic pain meds. Pt and family voiced understanding.

## 2023-02-11 LAB — URINE CULTURE

## 2023-02-12 ENCOUNTER — Encounter (HOSPITAL_COMMUNITY): Payer: Self-pay

## 2023-02-12 ENCOUNTER — Inpatient Hospital Stay (HOSPITAL_COMMUNITY)
Admission: EM | Admit: 2023-02-12 | Discharge: 2023-03-14 | DRG: 220 | Disposition: E | Payer: Medicare Other | Attending: Internal Medicine | Admitting: Internal Medicine

## 2023-02-12 ENCOUNTER — Other Ambulatory Visit: Payer: Self-pay

## 2023-02-12 ENCOUNTER — Telehealth (HOSPITAL_BASED_OUTPATIENT_CLINIC_OR_DEPARTMENT_OTHER): Payer: Self-pay

## 2023-02-12 ENCOUNTER — Inpatient Hospital Stay (HOSPITAL_COMMUNITY): Payer: Medicare Other

## 2023-02-12 DIAGNOSIS — J309 Allergic rhinitis, unspecified: Secondary | ICD-10-CM | POA: Diagnosis not present

## 2023-02-12 DIAGNOSIS — I4811 Longstanding persistent atrial fibrillation: Secondary | ICD-10-CM | POA: Diagnosis present

## 2023-02-12 DIAGNOSIS — Z860100 Personal history of colon polyps, unspecified: Secondary | ICD-10-CM

## 2023-02-12 DIAGNOSIS — I110 Hypertensive heart disease with heart failure: Secondary | ICD-10-CM | POA: Diagnosis present

## 2023-02-12 DIAGNOSIS — Z7901 Long term (current) use of anticoagulants: Secondary | ICD-10-CM | POA: Diagnosis not present

## 2023-02-12 DIAGNOSIS — Z885 Allergy status to narcotic agent status: Secondary | ICD-10-CM

## 2023-02-12 DIAGNOSIS — Z888 Allergy status to other drugs, medicaments and biological substances status: Secondary | ICD-10-CM

## 2023-02-12 DIAGNOSIS — Z961 Presence of intraocular lens: Secondary | ICD-10-CM | POA: Diagnosis present

## 2023-02-12 DIAGNOSIS — Z7722 Contact with and (suspected) exposure to environmental tobacco smoke (acute) (chronic): Secondary | ICD-10-CM | POA: Diagnosis present

## 2023-02-12 DIAGNOSIS — I495 Sick sinus syndrome: Secondary | ICD-10-CM | POA: Diagnosis present

## 2023-02-12 DIAGNOSIS — I7143 Infrarenal abdominal aortic aneurysm, without rupture: Secondary | ICD-10-CM | POA: Diagnosis not present

## 2023-02-12 DIAGNOSIS — J449 Chronic obstructive pulmonary disease, unspecified: Secondary | ICD-10-CM | POA: Diagnosis not present

## 2023-02-12 DIAGNOSIS — I350 Nonrheumatic aortic (valve) stenosis: Secondary | ICD-10-CM | POA: Diagnosis present

## 2023-02-12 DIAGNOSIS — B9620 Unspecified Escherichia coli [E. coli] as the cause of diseases classified elsewhere: Secondary | ICD-10-CM | POA: Diagnosis present

## 2023-02-12 DIAGNOSIS — R109 Unspecified abdominal pain: Secondary | ICD-10-CM | POA: Diagnosis not present

## 2023-02-12 DIAGNOSIS — I2510 Atherosclerotic heart disease of native coronary artery without angina pectoris: Secondary | ICD-10-CM | POA: Diagnosis present

## 2023-02-12 DIAGNOSIS — Z95828 Presence of other vascular implants and grafts: Secondary | ICD-10-CM | POA: Diagnosis not present

## 2023-02-12 DIAGNOSIS — Z7189 Other specified counseling: Secondary | ICD-10-CM | POA: Diagnosis not present

## 2023-02-12 DIAGNOSIS — L299 Pruritus, unspecified: Secondary | ICD-10-CM | POA: Diagnosis not present

## 2023-02-12 DIAGNOSIS — N390 Urinary tract infection, site not specified: Secondary | ICD-10-CM | POA: Diagnosis present

## 2023-02-12 DIAGNOSIS — F05 Delirium due to known physiological condition: Secondary | ICD-10-CM | POA: Diagnosis not present

## 2023-02-12 DIAGNOSIS — I351 Nonrheumatic aortic (valve) insufficiency: Secondary | ICD-10-CM | POA: Diagnosis present

## 2023-02-12 DIAGNOSIS — I4891 Unspecified atrial fibrillation: Secondary | ICD-10-CM

## 2023-02-12 DIAGNOSIS — I5082 Biventricular heart failure: Secondary | ICD-10-CM | POA: Diagnosis present

## 2023-02-12 DIAGNOSIS — Z8249 Family history of ischemic heart disease and other diseases of the circulatory system: Secondary | ICD-10-CM

## 2023-02-12 DIAGNOSIS — Z950 Presence of cardiac pacemaker: Secondary | ICD-10-CM | POA: Diagnosis present

## 2023-02-12 DIAGNOSIS — Z515 Encounter for palliative care: Secondary | ICD-10-CM | POA: Diagnosis not present

## 2023-02-12 DIAGNOSIS — I10 Essential (primary) hypertension: Secondary | ICD-10-CM | POA: Diagnosis present

## 2023-02-12 DIAGNOSIS — Z781 Physical restraint status: Secondary | ICD-10-CM

## 2023-02-12 DIAGNOSIS — E871 Hypo-osmolality and hyponatremia: Secondary | ICD-10-CM | POA: Diagnosis present

## 2023-02-12 DIAGNOSIS — Z8673 Personal history of transient ischemic attack (TIA), and cerebral infarction without residual deficits: Secondary | ICD-10-CM

## 2023-02-12 DIAGNOSIS — I714 Abdominal aortic aneurysm, without rupture, unspecified: Principal | ICD-10-CM | POA: Diagnosis present

## 2023-02-12 DIAGNOSIS — K5730 Diverticulosis of large intestine without perforation or abscess without bleeding: Secondary | ICD-10-CM | POA: Diagnosis not present

## 2023-02-12 DIAGNOSIS — B029 Zoster without complications: Secondary | ICD-10-CM | POA: Diagnosis not present

## 2023-02-12 DIAGNOSIS — Z01818 Encounter for other preprocedural examination: Secondary | ICD-10-CM | POA: Diagnosis not present

## 2023-02-12 DIAGNOSIS — Z79899 Other long term (current) drug therapy: Secondary | ICD-10-CM

## 2023-02-12 DIAGNOSIS — D696 Thrombocytopenia, unspecified: Secondary | ICD-10-CM | POA: Diagnosis present

## 2023-02-12 DIAGNOSIS — K219 Gastro-esophageal reflux disease without esophagitis: Secondary | ICD-10-CM | POA: Diagnosis not present

## 2023-02-12 DIAGNOSIS — Z66 Do not resuscitate: Secondary | ICD-10-CM | POA: Diagnosis not present

## 2023-02-12 DIAGNOSIS — Z789 Other specified health status: Secondary | ICD-10-CM

## 2023-02-12 DIAGNOSIS — I5032 Chronic diastolic (congestive) heart failure: Secondary | ICD-10-CM | POA: Diagnosis not present

## 2023-02-12 DIAGNOSIS — E785 Hyperlipidemia, unspecified: Secondary | ICD-10-CM | POA: Diagnosis present

## 2023-02-12 DIAGNOSIS — Z9049 Acquired absence of other specified parts of digestive tract: Secondary | ICD-10-CM

## 2023-02-12 DIAGNOSIS — K5900 Constipation, unspecified: Secondary | ICD-10-CM | POA: Diagnosis present

## 2023-02-12 DIAGNOSIS — Z87820 Personal history of traumatic brain injury: Secondary | ICD-10-CM

## 2023-02-12 DIAGNOSIS — Z87891 Personal history of nicotine dependence: Secondary | ICD-10-CM

## 2023-02-12 DIAGNOSIS — R52 Pain, unspecified: Secondary | ICD-10-CM | POA: Diagnosis not present

## 2023-02-12 DIAGNOSIS — Z7902 Long term (current) use of antithrombotics/antiplatelets: Secondary | ICD-10-CM

## 2023-02-12 DIAGNOSIS — Z85820 Personal history of malignant melanoma of skin: Secondary | ICD-10-CM

## 2023-02-12 LAB — CBC
HCT: 45.9 % (ref 36.0–46.0)
Hemoglobin: 14.6 g/dL (ref 12.0–15.0)
MCH: 30.6 pg (ref 26.0–34.0)
MCHC: 31.8 g/dL (ref 30.0–36.0)
MCV: 96.2 fL (ref 80.0–100.0)
Platelets: 145 10*3/uL — ABNORMAL LOW (ref 150–400)
RBC: 4.77 MIL/uL (ref 3.87–5.11)
RDW: 14.5 % (ref 11.5–15.5)
WBC: 13.6 10*3/uL — ABNORMAL HIGH (ref 4.0–10.5)
nRBC: 0 % (ref 0.0–0.2)

## 2023-02-12 LAB — COMPREHENSIVE METABOLIC PANEL
ALT: 43 U/L (ref 0–44)
AST: 36 U/L (ref 15–41)
Albumin: 3.2 g/dL — ABNORMAL LOW (ref 3.5–5.0)
Alkaline Phosphatase: 137 U/L — ABNORMAL HIGH (ref 38–126)
Anion gap: 13 (ref 5–15)
BUN: 22 mg/dL (ref 8–23)
CO2: 26 mmol/L (ref 22–32)
Calcium: 8.5 mg/dL — ABNORMAL LOW (ref 8.9–10.3)
Chloride: 95 mmol/L — ABNORMAL LOW (ref 98–111)
Creatinine, Ser: 0.92 mg/dL (ref 0.44–1.00)
GFR, Estimated: 57 mL/min — ABNORMAL LOW (ref >60)
Glucose, Bld: 117 mg/dL — ABNORMAL HIGH (ref 70–99)
Potassium: 3.7 mmol/L (ref 3.5–5.1)
Sodium: 134 mmol/L — ABNORMAL LOW (ref 135–145)
Total Bilirubin: 1.5 mg/dL — ABNORMAL HIGH (ref 0.3–1.2)
Total Protein: 5.7 g/dL — ABNORMAL LOW (ref 6.5–8.1)

## 2023-02-12 MED ORDER — DORZOLAMIDE HCL 2 % OP SOLN: 1.0000 [drp] | Freq: Two times a day (BID) | OPHTHALMIC | Status: DC | PRN

## 2023-02-12 MED ORDER — FLUTICASONE PROPIONATE 50 MCG/ACT NA SUSP: 1.0000 | Freq: Every day | NASAL | Status: DC | PRN

## 2023-02-12 MED ORDER — ONDANSETRON HCL 4 MG PO TABS: 4.0000 mg | ORAL_TABLET | Freq: Four times a day (QID) | ORAL | Status: DC | PRN

## 2023-02-12 MED ORDER — DILTIAZEM HCL ER COATED BEADS 120 MG PO CP24
120.0000 mg | ORAL_CAPSULE | Freq: Every day | ORAL | Status: DC
  Administered 2023-02-12 – 2023-02-18 (×7): 120 mg via ORAL
  Filled 2023-02-12 (×7): qty 1

## 2023-02-12 MED ORDER — ISOSORBIDE MONONITRATE ER 30 MG PO TB24
30.0000 mg | ORAL_TABLET | Freq: Every day | ORAL | Status: DC
  Administered 2023-02-13 – 2023-02-19 (×7): 30 mg via ORAL
  Filled 2023-02-12 (×7): qty 1

## 2023-02-12 MED ORDER — ALBUTEROL SULFATE (2.5 MG/3ML) 0.083% IN NEBU: 2.5000 mg | INHALATION_SOLUTION | Freq: Four times a day (QID) | RESPIRATORY_TRACT | Status: DC | PRN

## 2023-02-12 MED ORDER — IOHEXOL 350 MG/ML SOLN
75.0000 mL | Freq: Once | INTRAVENOUS | Status: AC | PRN
  Administered 2023-02-12: 75 mL via INTRAVENOUS

## 2023-02-12 MED ORDER — NITROGLYCERIN 0.4 MG SL SUBL: 0.4000 mg | SUBLINGUAL_TABLET | SUBLINGUAL | Status: DC | PRN

## 2023-02-12 MED ORDER — ACETAMINOPHEN 650 MG RE SUPP: 650.0000 mg | Freq: Four times a day (QID) | RECTAL | Status: DC | PRN

## 2023-02-12 MED ORDER — OXYCODONE-ACETAMINOPHEN 5-325 MG PO TABS
1.0000 | ORAL_TABLET | ORAL | Status: DC | PRN
  Administered 2023-02-12 – 2023-02-16 (×11): 1 via ORAL
  Filled 2023-02-12 (×11): qty 1

## 2023-02-12 MED ORDER — ALBUTEROL SULFATE HFA 108 (90 BASE) MCG/ACT IN AERS: 2.0000 | INHALATION_SPRAY | Freq: Four times a day (QID) | RESPIRATORY_TRACT | Status: DC | PRN

## 2023-02-12 MED ORDER — ROSUVASTATIN CALCIUM 5 MG PO TABS
5.0000 mg | ORAL_TABLET | Freq: Every day | ORAL | Status: DC
  Administered 2023-02-12 – 2023-02-18 (×7): 5 mg via ORAL
  Filled 2023-02-12 (×7): qty 1

## 2023-02-12 MED ORDER — MORPHINE SULFATE (PF) 2 MG/ML IV SOLN
2.0000 mg | INTRAVENOUS | Status: DC | PRN
  Administered 2023-02-12 – 2023-02-18 (×11): 2 mg via INTRAVENOUS
  Filled 2023-02-12 (×11): qty 1

## 2023-02-12 MED ORDER — ACETAMINOPHEN 325 MG PO TABS: 650.0000 mg | ORAL_TABLET | Freq: Four times a day (QID) | ORAL | Status: DC | PRN

## 2023-02-12 MED ORDER — CEPHALEXIN 500 MG PO CAPS
500.0000 mg | ORAL_CAPSULE | Freq: Two times a day (BID) | ORAL | Status: DC
  Administered 2023-02-12 – 2023-02-16 (×8): 500 mg via ORAL
  Filled 2023-02-12: qty 2
  Filled 2023-02-12: qty 1
  Filled 2023-02-12: qty 2
  Filled 2023-02-12 (×3): qty 1
  Filled 2023-02-12 (×2): qty 2

## 2023-02-12 MED ORDER — ONDANSETRON HCL 4 MG/2ML IJ SOLN: 4.0000 mg | Freq: Four times a day (QID) | INTRAMUSCULAR | Status: DC | PRN

## 2023-02-12 NOTE — Telephone Encounter (Signed)
Post ED Visit - Positive Culture Follow-up  Culture report reviewed by antimicrobial stewardship pharmacist: [facility] [x]  Rachael Weingarten, [place].D. []  Celedonio Miyamoto, Pharm.D., BCPS AQ-ID []  Garvin Fila, Pharm.D., BCPS []  Georgina Pillion, Pharm.D., BCPS []  [place], [address].D., BCPS, AAHIVP []  Estella Husk, Pharm.D., BCPS, AAHIVP []  Lysle Pearl, PharmD, BCPS []  Phillips Climes, PharmD, BCPS []  Agapito Games, PharmD, BCPS []  Verlan Friends, PharmD []  Mervyn Gay, PharmD, BCPS []  Vinnie Level, PharmD  [facility] []  Len Childs, PharmD []  Greer Pickerel, PharmD []  Adalberto Cole, PharmD []  Perlie Gold, Rph []  Lonell Face) Jean Rosenthal, PharmD []  Earl Many, PharmD []  Junita Push, PharmD []  Dorna Leitz, PharmD []  Terrilee Files, PharmD []  Lynann Beaver, PharmD []  Keturah Barre, PharmD []  Loralee Pacas, PharmD []  Bernadene Person, PharmD   Positive urine culture Treated with Cephalexin, organism sensitive to the same and no further patient follow-up is required at this time.  Sandria Senter 02/22/2023, 9:52 AM

## 2023-02-12 NOTE — ED Notes (Signed)
Pt's only complaint at present is lower mid to right abd pain
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
## 2023-02-12 NOTE — Progress Notes (Signed)
I reviewed the patient's repeat CT scan and there is no evidence of rupture.  I have discussed with my partners Dr. Randie Heinz and Dr. Karin Lieu and we feel the best option would be to allow her Pradaxa to washout and tentatively schedule EVAR on Friday.  She has previously declined multiple attempts to repair aneurysm in the past.  I discussed with her and her son at bedside there would be some risk that she could rupture in the interim but this gives [place] the best chance to try and have a good outcome in what is a very high risk patient.  CTA shows evidence of severe right-sided heart failure as there is no contrast in her aneurysm or aorta.  There is significant reflux of contrast into the IVC and hepatic veins from her right heart failure that makes her very high risk for anesthetic.  I will get updated echocardiogram tomorrow.  I have already discussed that I think she is very high risk for complications including renal failure possibly requiring dialysis, vessel injury, risk of anesthesia, MI, stroke, even death.  Family would like to continue discussing their options in talking with the son this evening.  CT also showed portal venous gas and I am unclear of the significance.  I have discussed with radiology and due to right heart failure and contrast timing there is really no ability to evaluate the bowel.  Cephus Shelling, MD [facility] Office: [phone]   Cephus Shelling
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
## 2023-02-12 NOTE — ED Provider Notes (Addendum)
[place] [facility] AT [facility] Provider Note   CSN: [id number] Arrival date & time: 2023/02/23  1150     History  Chief Complaint  Patient presents with   AAA on CT   Abdominal Pain    [name] is a 89 y.o. female.  Patient here for reevaluation of worsening AAA.  She has history of A-fib on blood thinners.  Came in a couple days ago with bad abdominal and back pain with symptoms have improved with narcotic pain medicine.  Had initially declined any evaluation by vascular surgery or any intervention but would like to maybe have a discussion with them about that.  She denies any chest pain shortness of breath weakness numbness tingling.  No loss of consciousness.  Narcotic pain medicine has helped but has been making her feel more loopy than normal.  The history is provided by the patient.       Home Medications Prior to Admission medications   Medication Sig Start Date End Date Taking? Authorizing Provider  tiZANidine (ZANAFLEX) 2 MG tablet Take 2 mg by mouth 3 (three) times daily as needed. 01/29/23  Yes [provider]  acetaminophen (TYLENOL) 500 MG tablet Take 1,000 mg by mouth every 6 (six) hours as needed for mild pain, moderate pain or headache.    [provider]  albuterol (VENTOLIN HFA) 108 (90 Base) MCG/ACT inhaler Inhale 2 puffs into the lungs every 6 (six) hours as needed for wheezing or shortness of breath. Patient not taking: Reported on 01/23/2023 05/10/22   Jerald Kief, MD  busPIRone (BUSPAR) 5 MG tablet Take 5 mg by mouth 2 (two) times daily. Start 10/17 take twice daily for 30 days.    [provider]  Calcium Carbonate-Vitamin D (CALCIUM + D PO) Take 1 tablet by mouth 2 (two) times daily.    [provider]  cephALEXin (KEFLEX) 500 MG capsule Take 1 capsule (500 mg total) by mouth 2 (two) times daily. 02/09/23   Garlon Hatchet, PA-C  dabigatran (PRADAXA) 75 MG CAPS capsule TAKE 1 CAPSULE(75 MG)  BY MOUTH EVERY 12 HOURS Patient taking differently: Take 75 mg by mouth 2 (two) times daily. 01/27/23   Croitoru, Mihai, MD  diltiazem (CARDIZEM CD) 120 MG 24 hr capsule Take 120 mg by mouth at bedtime. Start 02/26/22, take for 30 days per Dr. Nicholos Johns    [provider]  dorzolamide (TRUSOPT) 2 % ophthalmic solution Place 1 drop into the right eye 2 (two) times daily as needed (pain). 04/01/22   [provider]  fluticasone (FLONASE) 50 MCG/ACT nasal spray Place 1 spray into both nostrils daily as needed for allergies or rhinitis. Patient not taking: Reported on 01/23/2023    [provider]  furosemide (LASIX) 40 MG tablet Take 40 mg every morning 04/18/22   Croitoru, Mihai, MD  isosorbide mononitrate (IMDUR) 30 MG 24 hr tablet Take 1 tablet (30 mg total) by mouth daily. 01/27/23   Croitoru, Mihai, MD  magnesium oxide (MAG-OX) 400 (241.3 Mg) MG tablet Take 1 tablet (400 mg total) by mouth daily. 08/18/17   Croitoru, Mihai, MD  meclizine (ANTIVERT) 12.5 MG tablet Take 1 tablet (12.5 mg total) by mouth 3 (three) times daily as needed for dizziness. Patient not taking: Reported on 01/23/2023 10/14/21   Pokhrel, Rebekah [place], MD  metoprolol succinate (TOPROL-XL) 50 MG 24 hr tablet TAKE 1 TABLET BY MOUTH TWICE DAILY. TAKE WITH OR IMMEDIATELY FOLLOWING A MEAL 08/13/22   Croitoru, [place],  MD  Multiple Vitamin (MULTIVITAMIN WITH MINERALS) TABS tablet Take 1 tablet by mouth daily.    [provider]  Multiple Vitamins-Minerals (PRESERVISION AREDS 2) CAPS Take 1 capsule by mouth 2 (two) times daily.     [provider]  nitroGLYCERIN (NITROSTAT) 0.4 MG SL tablet Place 1 tablet (0.4 mg total) under the tongue every 5 (five) minutes as needed for chest pain. DO NOT TAKE MORE THAN 3 DOSES WITHIN 15 MINS Patient not taking: Reported on 01/23/2023 01/08/21   Croitoru, Mihai, MD  Omega-3 Fatty Acids (FISH OIL) 1000 MG CAPS Take 1,000 mg by mouth daily.    [provider]   ondansetron (ZOFRAN) 4 MG tablet Take 4 mg by mouth 2 (two) times daily. 02/10/23   [provider]  oxyCODONE-acetaminophen (PERCOCET) 5-325 MG tablet Take 1 tablet by mouth every 4 (four) hours as needed. 02/09/23   Garlon Hatchet, PA-C  pantoprazole (PROTONIX) 40 MG tablet Take 40 mg by mouth daily.    [provider]  Polyethyl Glycol-Propyl Glycol (SYSTANE OP) Place 2 drops into both eyes 2 (two) times daily as needed (for dry eyes).    [provider]  predniSONE (DELTASONE) 10 MG tablet Take 10 mg by mouth See admin instructions. Take 4 tablets once daily for 3 days, then take 3 tablets once daily for 3 days, then take 2 tablets once daily for 3 days, then take 1 tablet once daily for 3 days then stop. 01/29/23   [provider]  rosuvastatin (CRESTOR) 5 MG tablet Take 5 mg by mouth daily. 04/04/21   [provider]      Allergies    Tramadol and Protonix [pantoprazole]    Review of Systems   Review of Systems  Physical Exam Updated Vital Signs BP (!) 143/83 (BP Location: Right Arm)   Pulse 70   Temp 97.8 F (36.6 C)   Resp 15   Ht 5\' 2"  (1.575 m)   Wt 53.1 kg   SpO2 93%   BMI 21.41 kg/m  Physical Exam Vitals and nursing note reviewed.  Constitutional:      General: She is not in acute distress.    Appearance: She is well-developed. She is not ill-appearing.  HENT:     Head: Normocephalic and atraumatic.     Mouth/Throat:     Mouth: Mucous membranes are moist.     Pharynx: Oropharynx is clear.  Eyes:     Extraocular Movements: Extraocular movements intact.     Conjunctiva/sclera: Conjunctivae normal.     Pupils: Pupils are equal, round, and reactive to light.  Cardiovascular:     Rate and Rhythm: Normal rate and regular rhythm.     Heart sounds: Normal heart sounds. No murmur heard. Pulmonary:     Effort: Pulmonary effort is normal. No respiratory distress.     Breath sounds: Normal breath sounds.  Abdominal:      Palpations: Abdomen is soft.     Tenderness: There is no abdominal tenderness.  Musculoskeletal:        General: No swelling.     Cervical back: Neck supple.  Skin:    General: Skin is warm and dry.     Capillary Refill: Capillary refill takes less than 2 seconds.  Neurological:     Mental Status: She is alert.  Psychiatric:        Mood and Affect: Mood normal.     ED Results / Procedures / Treatments   Labs (all labs  ordered are listed, but only abnormal results are displayed) Labs Reviewed  CBC - Abnormal; Notable for the following components:      Result Value   WBC 13.6 (*)    Platelets 145 (*)    All other components within normal limits  COMPREHENSIVE METABOLIC PANEL - Abnormal; Notable for the following components:   Sodium 134 (*)    Chloride 95 (*)    Glucose, Bld 117 (*)    Calcium 8.5 (*)    Total Protein 5.7 (*)    Albumin 3.2 (*)    Alkaline Phosphatase 137 (*)    Total Bilirubin 1.5 (*)    GFR, Estimated 57 (*)    All other components within normal limits    EKG None  Radiology No results found.  Procedures Procedures    Medications Ordered in ED Medications - No data to display  ED Course/ Medical Decision Making/ A&P                                 Medical Decision Making Amount and/or Complexity of Data Reviewed Radiology: ordered.  Risk Decision regarding hospitalization.   [name] is here for vascular surgery consultation.  Normal vitals.  No fever.  History of A-fib on blood thinners.  History of AAA.  Had previously been following with Dr. Randie Heinz w/ vascular surgery but in 2022 aneurysm was 6.2 in the abdominal portion and at that time she declined any surgical workup and was no longer to follow along about it.  She developed severe abdominal pain a few days ago came for CT scan that showed increased size to 7.8 cm in the abdominal aortic exam.  No evidence of rupture however.  Sounds like she had discussion with the emergency  doctors about this but she continued declined any intervention.  Was discharged home with pain medicine.  They were treating for possible urine infection as well.  She still has some pain but narcotics have helped.  She is not having much pain with urination.  She grew out E. coli in her urine culture which is sensitive to the Keflex that she is on.  Overall labs today are unremarkable per my review and interpretation.  Today she would like to talk to vascular surgery to see if there is any intervention that could be done.  COVID family.  I talked with Dr. Chestine Spore with vascular surgery who is aware of the situation and will come and talk to the patient.    Overall vascular surgery will have her admitted to medicine service and will likely consider repair.  We will get a new CTA today to make sure nothing else is changed.  She is on Pradaxa and they are working with pharmacy about possible reversal for this.  Will admit to medicine for further care.  This chart was dictated using voice recognition software.  Despite best efforts to proofread,  errors can occur which can change the documentation meaning.    Final Clinical Impression(s) / ED Diagnoses Final diagnoses:  Abdominal aortic aneurysm (AAA), unspecified part, unspecified whether ruptured [facility])    Rx / DC Orders ED Discharge Orders     None         Virgina Norfolk, DO March 14, 2023 1417    Virgina Norfolk, DO 2023-03-14 1458
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
## 2023-02-12 NOTE — ED Triage Notes (Signed)
Pt had CT scan of chest and abdomen Sat night and it showed her AAA has increased in size and at that time, she didn't want to do anything about it, but now she wants something done.

## 2023-02-12 NOTE — ED Provider Triage Note (Signed)
Emergency Medicine Provider Triage Evaluation Note  [name] , a 89 y.o. female  was evaluated in triage.  Pt returns for reevaluation.   She was seen in ER on 9/30 and had CT of chest and abdomen that showed increased size of AAA, now measuring 7.8 cm (previously 6.2 in 2022). She had refused any surgical intervention when she previously saw vascular (Dr Randie Heinz). Providers had thorough conversation with patient in ER who refused intervention, advanced resuscitative or inpatient care. She was discharged with treatment for UTI and encouraged to follow up.  Patient presents today and states she has changed her mind about wanting intervention. Patient has had no worsening of symptoms. She is on oxycodone for pain and feels "crazy and high".   Review of Systems  Positive: RLQ abdominal pain (radiates to back) Negative: Numbness, weakness, SOB, CP  Physical Exam  BP (!) 143/83 (BP Location: Right Arm)   Pulse 70   Temp 97.8 F (36.6 C)   Resp 15   Ht 5\' 2"  (1.575 m)   Wt 53.1 kg   SpO2 93%   BMI 21.41 kg/m  Gen:   Awake, no distress   Resp:  Normal effort  MSK:   Moves extremities without difficulty  Other:  Equal pulses in all extremities, no appreciable abdominal mass on palpation  Medical Decision Making  Medically screening exam initiated at 12:31 PM.  Appropriate orders placed.  [name] was informed that the remainder of the evaluation will be completed by another provider, this initial triage assessment does not replace that evaluation, and the importance of remaining in the ED until their evaluation is complete.  Basic lab workup ordered. Patient has had no change in symptoms since 9/30 so do not feel ordering repeat imaging at this time is necessary.    Su Monks, PA-C 03/06/2023 1237

## 2023-02-13 ENCOUNTER — Inpatient Hospital Stay (HOSPITAL_COMMUNITY): Payer: Medicare Other

## 2023-02-13 DIAGNOSIS — Z66 Do not resuscitate: Secondary | ICD-10-CM | POA: Diagnosis not present

## 2023-02-13 DIAGNOSIS — Z515 Encounter for palliative care: Secondary | ICD-10-CM

## 2023-02-13 DIAGNOSIS — Z01818 Encounter for other preprocedural examination: Secondary | ICD-10-CM

## 2023-02-13 DIAGNOSIS — I714 Abdominal aortic aneurysm, without rupture, unspecified: Secondary | ICD-10-CM | POA: Diagnosis not present

## 2023-02-13 DIAGNOSIS — Z7189 Other specified counseling: Secondary | ICD-10-CM | POA: Diagnosis not present

## 2023-02-13 DIAGNOSIS — I4891 Unspecified atrial fibrillation: Secondary | ICD-10-CM | POA: Diagnosis not present

## 2023-02-13 LAB — COMPREHENSIVE METABOLIC PANEL
ALT: 34 U/L (ref 0–44)
AST: 28 U/L (ref 15–41)
Albumin: 3 g/dL — ABNORMAL LOW (ref 3.5–5.0)
Alkaline Phosphatase: 125 U/L (ref 38–126)
Anion gap: 11 (ref 5–15)
BUN: 19 mg/dL (ref 8–23)
CO2: 27 mmol/L (ref 22–32)
Calcium: 8.6 mg/dL — ABNORMAL LOW (ref 8.9–10.3)
Chloride: 98 mmol/L (ref 98–111)
Creatinine, Ser: 0.88 mg/dL (ref 0.44–1.00)
GFR, Estimated: >60 mL/min (ref >60)
Glucose, Bld: 107 mg/dL — ABNORMAL HIGH (ref 70–99)
Potassium: 3.9 mmol/L (ref 3.5–5.1)
Sodium: 136 mmol/L (ref 135–145)
Total Bilirubin: 1.9 mg/dL — ABNORMAL HIGH (ref 0.3–1.2)
Total Protein: 5.6 g/dL — ABNORMAL LOW (ref 6.5–8.1)

## 2023-02-13 LAB — CBC WITH DIFFERENTIAL/PLATELET
Abs Immature Granulocytes: 0.07 10*3/uL (ref 0.00–0.07)
Basophils Absolute: 0 10*3/uL (ref 0.0–0.1)
Basophils Relative: 0 %
Eosinophils Absolute: 0 10*3/uL (ref 0.0–0.5)
Eosinophils Relative: 0 %
HCT: 44.2 % (ref 36.0–46.0)
Hemoglobin: 14.4 g/dL (ref 12.0–15.0)
Immature Granulocytes: 1 %
Lymphocytes Relative: 8 %
Lymphs Abs: 1 10*3/uL (ref 0.7–4.0)
MCH: 30.3 pg (ref 26.0–34.0)
MCHC: 32.6 g/dL (ref 30.0–36.0)
MCV: 93.1 fL (ref 80.0–100.0)
Monocytes Absolute: 1.1 10*3/uL — ABNORMAL HIGH (ref 0.1–1.0)
Monocytes Relative: 9 %
Neutro Abs: 10.4 10*3/uL — ABNORMAL HIGH (ref 1.7–7.7)
Neutrophils Relative %: 82 %
Platelets: 139 10*3/uL — ABNORMAL LOW (ref 150–400)
RBC: 4.75 MIL/uL (ref 3.87–5.11)
RDW: 14.5 % (ref 11.5–15.5)
WBC: 12.6 10*3/uL — ABNORMAL HIGH (ref 4.0–10.5)
nRBC: 0 % (ref 0.0–0.2)

## 2023-02-13 LAB — PROTIME-INR
INR: 1.3 — ABNORMAL HIGH (ref 0.8–1.2)
Prothrombin Time: 16.1 s — ABNORMAL HIGH (ref 11.4–15.2)

## 2023-02-13 LAB — ECHOCARDIOGRAM COMPLETE
AR max vel: 0.92 cm2
AV Area VTI: 0.91 cm2
AV Area mean vel: 0.88 cm2
AV Mean grad: 16 mm[Hg]
AV Peak grad: 29.1 mm[Hg]
Ao pk vel: 2.7 m/s
Area-P 1/2: 3.91 cm2
Height: 62 in
MV M vel: 4.5 m/s
MV Peak grad: 81 mm[Hg]
P 1/2 time: 547 ms
S' Lateral: 1.75 cm
Weight: 1873.03 [oz_av]

## 2023-02-13 LAB — MAGNESIUM: Magnesium: 2.4 mg/dL (ref 1.7–2.4)

## 2023-02-13 MED ORDER — METOPROLOL SUCCINATE ER 25 MG PO TB24
25.0000 mg | ORAL_TABLET | Freq: Every day | ORAL | Status: DC
  Administered 2023-02-13 – 2023-02-19 (×7): 25 mg via ORAL
  Filled 2023-02-13 (×7): qty 1

## 2023-02-13 MED ORDER — FUROSEMIDE 40 MG PO TABS
40.0000 mg | ORAL_TABLET | Freq: Every day | ORAL | Status: DC
  Administered 2023-02-13 – 2023-02-19 (×7): 40 mg via ORAL
  Filled 2023-02-13 (×7): qty 1

## 2023-02-13 NOTE — Progress Notes (Addendum)
[facility]   Subjective  - Continues to have pain right LQ, just given pain medication this am.  She states it does help some.   Objective (!) 148/94 70 98 F (36.7 C) (Oral) 18 97% No intake or output data in the 24 hours ending 02/13/23 0725  Palpable DP pulses, motor intact B LE Lungs non labored breathing at rest Abdomin right LQ pain   Assessment/Planning: AAA without rupture on repeat CTA  Plan EVAR repair Friday 03/02/2023  Dr.  Chestine Spore recommended echocardiogram after review of CTA with evidence of severe right-sided heart failure.   Would suggest cardiac monitory, pending results of Echo. NPO past MN She agrees with proceeding with EVAR  Mosetta Pigeon 02/13/2023 7:25 AM --  Laboratory Lab Results: Recent Labs    03/06/2023 1241 02/13/23 0449  WBC 13.6* 12.6*  HGB 14.6 14.4  HCT 45.9 44.2  PLT 145* 139*   BMET Recent Labs    02/24/2023 1241 02/13/23 0449  NA 134* 136  K 3.7 3.9  CL 95* 98  CO2 26 27  GLUCOSE 117* 107*  BUN 22 19  CREATININE 0.92 0.88  CALCIUM 8.5* 8.6*    COAG Lab Results  Component Value Date   INR 1.3 (H) 02/13/2023   INR 1.9 (H) 02/06/2021   INR 1.4 (H) 02/05/2021   No results found for: "PTT"  I have seen and evaluated the patient. I agree with the PA note as documented above.  Plan EVAR tomorrow for 7.8 cm abdominal aortic aneurysm.  Patient has declined repair on multiple instances in the past with Dr. Randie Heinz when offered surgery in the past.  Discussed with the patient and son at bedside.  She is a bit more confused today but states she still wants to have surgery.  I did update them on the echocardiogram showing likely moderate to severe aortic stenosis with significant valvular disease including tricuspid regurgitation.  I think she is very high risk for the OR as we discussed in detail yesterday.  If we can do this awake with MAC that would probably be her best chance for good outcome.   Discussed if she cannot tolerate MAC she will have to undergo general anesthesia that would put her at much higher risk for cardiac event.  They understand the risks are very high.  Cephus Shelling, MD [facility] [place] Office: [phone]

## 2023-02-13 NOTE — Plan of Care (Signed)

## 2023-02-13 NOTE — Consult Note (Signed)
Palliative Care Consult Note                                  Date: 02/13/2023   Patient Name: [name]  DOB: [birth year]  MRN: [id number]  Age / Sex: 89 y.o., female  PCP: [name], MD Referring Physician: Glade Lloyd, MD  Reason for Consultation: Establishing goals of care  HPI/Patient Profile: 89 y.o. female  with past medical history of  abdominal aortic aneurysm (previously followed with Dr. Florene Route surgery (In 2022, aneurysm size was 6.2, patient had declined surgical workup and was no longer following up with vascular surgery), persistent A-fib on Pradaxa, complete heart block status post pacemaker, moderate CAD without obstructive lesions, COPD, aortic insufficiency, type a dissection of descending thoracic aorta, hypertension, hyperlipidemia presented with worsening abdominal pain. She had presented to the ED with abdominal and back pain few days ago. CT showed increased size of abdominal aortic aneurysm up to 7.8 cm without rupture. Patient declined any intervention. She also had evidence of UTI and was discharged on oral Keflex and pain meds. Patient subsequently has changed her mind and wanted to speak to vascular surgery for possible surgical intervention.  On presentation, vascular surgery was consulted.    Palliative medicine was consulted for GOC conversations.  Past Medical History:  Diagnosis Date   Abdominal aortic aneurysm (HCC)    Abdominal pain    due to Sequential arterial mediolysis.    Allergic rhinitis    Aneurysm (HCC)    reports having liver "aneurysms" for which she underwent coiling   Arthritis    Ascending aortic aneurysm (HCC)    Blood transfusion    Cancer (HCC)    myelonoma behind eye   Cardiac tamponade, recurrent episode, admitted 9/5, but now felt to be pericarditits 01/17/2012   Chronic atrial fibrillation (HCC)    Chronic diastolic CHF (congestive heart failure) (HCC)     Complete heart block (HCC) 01/23/2016   Afib    COPD (chronic obstructive pulmonary disease) (HCC)    Dissecting aneurysm of thoracic aorta, Stanford type B (HCC) 10/2017   Diverticulosis of colon (without mention of hemorrhage)    Dysfunction of eustachian tube    Fibromuscular dysplasia (HCC)    GERD (gastroesophageal reflux disease)    Head injury, acute, with loss of consciousness (HCC) 1987    for 4 -5 days. Hit in head with a screw from a swing.   Heart murmur    Hiatal hernia    HTN (hypertension)    Hyperlipidemia    Mild aortic stenosis    Moderate aortic insufficiency    Moderate tricuspid regurgitation    Pacemaker    Persistent atrial fibrillation (HCC)    Personal history of colonic polyps    Tachycardia-bradycardia (HCC)    a. s/p PPM.    Subjective:   This NP Wynne Dust reviewed medical records, received report from team, assessed the patient and then meet at the patient's bedside to discuss diagnosis, prognosis, GOC, EOL wishes disposition and options.  I met with the patient at the bedside.  Initially she was too sleepy due to recent pain medication.  Her son Tinnie Gens was also present.  I returned about an hour and a half later the patient was more awake with it.   We meet to discuss diagnosis prognosis, GOC, EOL wishes, disposition and options. Concept of Palliative Care was introduced as specialized medical care for  people and their families living with serious illness.  If focuses on providing relief from the symptoms and stress of a serious illness.  The goal is to improve quality of life for both the patient and the family. Values and goals of care important to patient and family were attempted to be elicited.  Created space and opportunity for patient  and family to explore thoughts and feelings regarding current medical situation   Natural trajectory and current clinical status were discussed. Questions and concerns addressed. Patient  encouraged to call with  questions or concerns.    Patient/Family Understanding of Illness: They understand that she has an aneurysm in her belly.  They know that it is very large and could rupture.  They understand that it is just a matter of time before it ruptures and it would be the end of her life.  They understand that surgery has been offered but it is a very risky procedure given her current situation and age.  We spent time clarifying further clinical details.  Life Review: Patient has 6 kids including 3 stepdaughters.  3 boys are coming up to see her from [place] and the oldest passed away 1 and half years ago.  Her husband also passed away about 3 years ago.  We reflected on a lot of loss recently.  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  Her hobbies have been limited recently but if she gets out of the house she enjoys shopping.  Previously she enjoyed camping and travel.  She is religious and her preacher has come by to see her.  Patient Values: Family, faith  Goals: Possible attempt to fix her hernia but overall to not suffer  Today's Discussion: In addition to discussion described above we had substantial discussion on various topics.  We discussed that her hernia was diagnosed a few years ago.  However recently it has gotten larger which caused abdominal pain and her presentation to the ER couple weeks ago.  She opted to have no surgery at that time.  However, she began thinking and changed her mind.  As she and her son explained that they felt that if it was going to be the end of her life and resulted in her just "sitting around on pain medicines waiting to die" versus a high risk procedure where she could die on the operating table that she would prefer to make the attempt at repair.  However, they now realized there is a gray area where the surgery could be technically successful but she could come out of the operating room with a poor quality of life and potentially suffer.  This is what  they would not want.  We discussed that it is possible to "catch our bats" against this possible scenario.  Related to that would be to be very clear about what her wishes are and who are decision-maker would be in the absence of capacity.  She states that she would want her son Tinnie Gens to make her decisions, with input from her other children.  She states they all get along and it should not be a problem which Tinnie Gens agrees.  She is clear that she would not want to be intubated or trach, she would not want a feeding tube, would not want other advanced interventions like hemodialysis.   We discussed CODE STATUS and she agrees that she is a DNR.  I did make clear that during procedures and surgeries patients are reverted to full code for  the duration of the procedure but can be reverted back to DNR postoperative, per her wishes.  I offered ongoing palliative medicine support.  I provided a contact card with our information and encouraged them to call for any questions or concerns.  I shared that I would return tomorrow to visit with time.  The patient's son states they are waiting for the vascular team to review the ultrasound of her heart and determine if she is a candidate at which point they can make a final decision whether to proceed for surgery.  I provided emotional and general support through therapeutic listening, empathy, sharing of stories, and other techniques. I answered all questions and addressed all concerns to the best of my ability.  Review of Systems  Constitutional:  Positive for appetite change and fatigue.  Respiratory:  Negative for chest tightness and shortness of breath.   Cardiovascular:  Negative for chest pain.  Gastrointestinal:  Positive for abdominal pain and constipation. Negative for nausea and vomiting.  Neurological:  Positive for weakness.    Objective:   Primary Diagnoses: Present on Admission:  Abdominal aortic aneurysm (AAA) [facility])   Physical  Exam Cardiovascular:     Rate and Rhythm: Normal rate and regular rhythm.     Heart sounds: Murmur heard.     Systolic murmur is present with a grade of 4/6.  Pulmonary:     Effort: Pulmonary effort is normal. No respiratory distress.     Breath sounds: No wheezing or rhonchi.  Abdominal:     General: Abdomen is flat. Bowel sounds are normal. There is no distension.     Palpations: Abdomen is soft.  Neurological:     General: No focal deficit present.  Psychiatric:        Mood and Affect: Mood normal.        Behavior: Behavior normal.     Vital Signs:  BP (!) 148/94 (BP Location: Left Arm)   Pulse 70   Temp 98 F (36.7 C) (Oral)   Resp 18   Ht 5\' 2"  (1.575 m)   Wt 53.1 kg   SpO2 97%   BMI 21.41 kg/m   Palliative Assessment/Data: 60%    Advanced Care Planning:   Existing Vynca/ACP Documentation: DNR effective 09/12/2020 Advanced Directive signed 02/27/2011  Primary Decision Maker: PATIENT  Code Status/Advance Care Planning: DNR  A discussion was had today regarding advanced directives. Concepts specific to code status, artifical feeding and hydration, continued IV antibiotics and rehospitalization was had.  The difference between a aggressive medical intervention path and a palliative comfort care path for this patient at this time was had.   Decisions/Changes to ACP: No vent/trach (other than intraoperative) No feeding tube No HD or other advanced therapies to prolong life  Assessment & Plan:   Impression: 89 year old female with chronic comorbidities and acute presentations as described above.  She has a very large AAA at risk for rupture.  Initially she declined surgery but is now wanting to consider surgery.  Awaiting vascular surgery final decision on candidacy before the patient can make a final decision on whether to proceed.  She understands this is a high risk.  We have clarified many goals of care today and advance care planning.  She wishes to be a  DNR, but understand she would be full code during surgery.  She would not want to be on a prolonged vent/trach, would not want a feeding tube, would not want advanced interventions such as hemodialysis.  Overall she  does not want to suffer.  She designates her son Tinnie Gens as Education officer, environmental with input from her other children.  Overall prognosis poor.  SUMMARY OF RECOMMENDATIONS   Remain DNR-limited Await decision on surgery from vascular surgery and patient/family Confirmed advanced directives in Vynca/ACP tab in epic Palliative medicine will follow-up tomorrow  Symptom Management:  Per primary team PMT is available to assist as needed  Prognosis:  Unable to determine  Discharge Planning:  To Be Determined   Discussed with: Patient, patient's family, medical team, nursing team    Thank you for allowing [place] to participate in the care of Antanette Conry PMT will continue to support holistically.  Time Total: 75 min ACP Time: 20 min  Detailed review of medical records (labs, imaging, vital signs), medically appropriate exam, discussed with treatment team, counseling and education to patient, family, & staff, documenting clinical information, medication management, coordination of care  Signed by: Wynne Dust, NP Palliative Medicine Team  Team Phone # [phone] (Nights/Weekends)  02/13/2023, 1:55 PM

## 2023-02-13 NOTE — Progress Notes (Signed)
Remote pacemaker transmission.   

## 2023-02-13 NOTE — Progress Notes (Signed)
Echocardiogram 2D Echocardiogram has been performed.  Lucendia Herrlich 02/13/2023, 9:27 AM

## 2023-02-13 NOTE — Progress Notes (Signed)
PROGRESS NOTE    [name]  YQM:[id number] DOB: [birth year] DOA: 02/28/2023 PCP: [name], MD   Brief Narrative:  89 y.o. female with medical history significant of abdominal aortic aneurysm (previously followed with Dr. Florene Route surgery: In 2022, aneurysm size was 6.2, patient had declined surgical workup and was no longer following up with vascular surgery), persistent A-fib on Pradaxa, complete heart block status post pacemaker, moderate CAD without obstructive lesions, COPD, aortic insufficiency, type a dissection of descending thoracic aorta, hypertension, hyperlipidemia presented with worsening abdominal pain. She had presented to the ED with abdominal and back pain few days ago. CT showed increased size of abdominal aortic aneurysm up to 7.8 cm without rupture. Patient declined any intervention. She also had evidence of UTI and was discharged on oral Keflex and pain meds. Patient subsequently has changed her mind and wanted to speak to vascular surgery for possible surgical intervention.  On presentation, vascular surgery was consulted.  Assessment & Plan:   Enlarging AAA presenting with worsening abdominal pain -previously followed with Dr. Florene Route surgery: In 2022, aneurysm size was 6.2, patient had declined surgical workup and was no longer following up with vascular surgery -Recent CT had shown increased size of abdominal aortic aneurysm up to 7.8 cm without rupture.  Patient declined any intervention. - Patient subsequently changed her mind and now wants surgical intervention. -Repeat CT angio on team 224 showed increased size of infrarenal abdominal arctic aneurysm measuring 7.8 x 7.8 cm.  Vascular surgery following and planning for possible EVAR on 2023-02-28.    Chronic diastolic heart failure -Compensated.  Strict input and output.  Daily weights.  Fluid restriction.  Resume Lasix.  Continue Imdur.  Resume metoprolol. -2D echo ordered by vascular surgery.   Will follow.  Leukocytosis -Improving.  Monitor   Thrombocytopenia -Questionable cause.  Monitor   Hyponatremia -Improved.   Hypertension -Monitor blood pressure.  Continue Cardizem, Imdur.  Resume Lasix and metoprolol.   Persistent A-fib -Hold Pradaxa.  Continue Cardizem.  Resume metoprolol in a.m. if needed.  Outpatient follow-up with cardiology   History of complete heart block status post pacemaker -Outpatient follow-up with cardiology   Moderate CAD without obstructing lesions -Stable.  Outpatient follow-up with cardiology   Hyperlipidemia -Continue statin   Recent diagnosis of UTI -Had E. coli growing in recent urine cultures from 02/09/2023.  Continue Keflex to finish course.   Goals of care -Consult palliative care for goals of care discussion     DVT prophylaxis: SCDs Code Status: DNR Family Communication: Son at bedside  Disposition Plan: Status is: Inpatient Remains inpatient appropriate because: Of severity of illness  Consultants: Vascular surgery/palliative care  Procedures: None  Antimicrobials: Keflex  Subjective: Patient seen and examined at bedside.  Still complains of lower abdominal pain, pain medications helping.  No fever, worsening shortness of breath or chest pain reported.  Objective: Vitals:   02/15/2023 1430 03/07/2023 1751 02/13/23 0019 02/13/23 0419  BP: (!) 149/90 (!) 146/86 120/79 (!) 148/94  Pulse: 68 70 70 70  Resp: 19 17 18 18   Temp:  97.7 F (36.5 C) 98.2 F (36.8 C) 98 F (36.7 C)  TempSrc:  Oral Oral Oral  SpO2: 96% 95% 97% 97%  Weight:      Height:       No intake or output data in the 24 hours ending 02/13/23 0753 Filed Weights   02/16/2023 1215  Weight: 53.1 kg    Examination:  General exam: Appears calm and comfortable.  On room  air.  Elderly female lying in bed. Respiratory system: Bilateral decreased breath sounds at bases with scattered crackles Cardiovascular system: S1 & S2 heard, Rate  controlled Gastrointestinal system: Abdomen is distended, soft and tender in the lower quadrant.  Normal bowel sounds heard. Extremities: No cyanosis, clubbing; trace lower extremity edema  Central nervous system: Wakes up slightly.  Slow to respond.  Poor historian.  No focal neurological deficits. Moving extremities Skin: No rashes, lesions or ulcers Psychiatry: Mostly flat affect.  Not agitated currently.   Data Reviewed: I have personally reviewed following labs and imaging studies  CBC: Recent Labs  Lab 02/09/23 0051 02/09/23 0101 02/23/2023 1241 02/13/23 0449  WBC 18.7*  --  13.6* 12.6*  NEUTROABS 15.7*  --   --  10.4*  HGB 15.6* 16.7* 14.6 14.4  HCT 47.8* 49.0* 45.9 44.2  MCV 93.5  --  96.2 93.1  PLT 170  --  145* 139*   Basic Metabolic Panel: Recent Labs  Lab 02/09/23 0051 02/09/23 0101 02/18/2023 1241 02/13/23 0449  NA 136 136 134* 136  K 3.9 3.7 3.7 3.9  CL 97* 99 95* 98  CO2 23  --  26 27  GLUCOSE 121* 119* 117* 107*  BUN 28* 31* 22 19  CREATININE 0.87 0.90 0.92 0.88  CALCIUM 9.3  --  8.5* 8.6*  MG  --   --   --  2.4   GFR: Estimated Creatinine Clearance: 30.2 mL/min (by C-G formula based on SCr of 0.88 mg/dL). Liver Function Tests: Recent Labs  Lab 02/09/23 0051 03/03/2023 1241 02/13/23 0449  AST 52* 36 28  ALT 59* 43 34  ALKPHOS 142* 137* 125  BILITOT 1.0 1.5* 1.9*  PROT 6.6 5.7* 5.6*  ALBUMIN 3.7 3.2* 3.0*   Recent Labs  Lab 02/09/23 0051  LIPASE 30   No results for input(s): "AMMONIA" in the last 168 hours. Coagulation Profile: Recent Labs  Lab 02/13/23 0449  INR 1.3*   Cardiac Enzymes: No results for input(s): "CKTOTAL", "CKMB", "CKMBINDEX", "TROPONINI" in the last 168 hours. BNP (last 3 results) No results for input(s): "PROBNP" in the last 8760 hours. HbA1C: No results for input(s): "HGBA1C" in the last 72 hours. CBG: No results for input(s): "GLUCAP" in the last 168 hours. Lipid Profile: No results for input(s): "CHOL", "HDL",  "LDLCALC", "TRIG", "CHOLHDL", "LDLDIRECT" in the last 72 hours. Thyroid Function Tests: No results for input(s): "TSH", "T4TOTAL", "FREET4", "T3FREE", "THYROIDAB" in the last 72 hours. Anemia Panel: No results for input(s): "VITAMINB12", "FOLATE", "FERRITIN", "TIBC", "IRON", "RETICCTPCT" in the last 72 hours. Sepsis Labs: No results for input(s): "PROCALCITON", "LATICACIDVEN" in the last 168 hours.  Recent Results (from the past 240 hour(s))  Urine Culture     Status: Abnormal   Collection Time: 02/09/23 12:41 AM   Specimen: Urine, Random  Result Value Ref Range Status   Specimen Description URINE, RANDOM  Final   Special Requests   Final    URINE, CLEAN CATCH Performed at [facility], [address] [place], [place] [address]    Culture >=100,000 COLONIES/mL ESCHERICHIA COLI (A)  Final   Report Status 02/11/2023 FINAL  Final   Organism ID, Bacteria ESCHERICHIA COLI (A)  Final      Susceptibility   Escherichia coli - MIC*    AMPICILLIN <=2 SENSITIVE Sensitive     CEFAZOLIN <=4 SENSITIVE Sensitive     CEFEPIME <=0.12 SENSITIVE Sensitive     CEFTRIAXONE <=0.25 SENSITIVE Sensitive     CIPROFLOXACIN <=0.25  SENSITIVE Sensitive     GENTAMICIN <=1 SENSITIVE Sensitive     IMIPENEM <=0.25 SENSITIVE Sensitive     NITROFURANTOIN <=16 SENSITIVE Sensitive     TRIMETH/SULFA <=20 SENSITIVE Sensitive     AMPICILLIN/SULBACTAM <=2 SENSITIVE Sensitive     PIP/TAZO <=4 SENSITIVE Sensitive     * >=100,000 COLONIES/mL ESCHERICHIA COLI         Radiology Studies: CT Angio Abd/Pel W and/or Wo Contrast  Result Date: 03/09/2023 CLINICAL DATA:  Abdominal aortic aneurysm, preop planning EXAM: CTA ABDOMEN AND PELVIS WITHOUT AND WITH CONTRAST TECHNIQUE: Multidetector CT imaging of the abdomen and pelvis was performed using the standard protocol during bolus administration of intravenous contrast. Multiplanar reconstructed images and MIPs were obtained and reviewed to evaluate the vascular  anatomy. RADIATION DOSE REDUCTION: This exam was performed according to the departmental dose-optimization program which includes automated exposure control, adjustment of the mA and/or kV according to patient size and/or use of iterative reconstruction technique. CONTRAST:  75mL OMNIPAQUE IOHEXOL 350 MG/ML SOLN COMPARISON:  None Available. FINDINGS: VASCULAR Aorta: The abdominal aorta and its branches are not well opacified likely due to heart failure as a significant portion of the contrast fills the IVC and hepatic veins. Increased size of the infrarenal abdominal aortic aneurysm now measuring 7.8 x 7.8 cm, previously 7.8 x 7.6 cm there is new hazy fat stranding about the aneurysm concerning for impending rupture. Calcified atherosclerotic plaque. Celiac: Patency is not well evaluated due to slow flow. SMA: Patency is not well evaluated due to slow flow. Renals: Patency is not well evaluated due to slow flow. IMA: Patency is not well evaluated due to slow flow. Inflow: Patency is not well evaluated due to slow flow. Proximal Outflow: Patency is not well evaluated due to slow flow. Veins: Portal venous gas in the left hepatic lobe. Review of the MIP images confirms the above findings. NON-VASCULAR Lower chest: No acute abnormality. Hepatobiliary: Cholecystectomy. Pancreas: Unremarkable. Spleen: Unremarkable. Adrenals/Urinary Tract: Stable adrenal glands and kidneys. No obstructing urinary calculi or hydronephrosis. Unremarkable bladder. Stomach/Bowel: Normal caliber large and small bowel. Moderate colonic stool load. Colonic diverticulosis without diverticulitis. No wall thickening or pneumatosis to suggest source of portal venous gas in the left hepatic lobe. Unfortunately due to poor contrast flow secondary to heart failure evaluation for hypoenhancing bowel wall is not possible. Lymphatic: No lymphadenopathy. Reproductive: Uterus and bilateral adnexa are unremarkable. Other: No free intraperitoneal air. Trace  free fluid in the pelvis is low-density. Musculoskeletal: No acute fracture. Remote inferior pubic rami fractures. Chronic compression fracture of L3 status post vertebroplasty. IMPRESSION: 1. Increased size of the infrarenal abdominal aortic aneurysm now measuring 7.8 x 7.8 cm, previously 7.8 x 7.6 cm. There is new hazy fat stranding about the aneurysm concerning for impending rupture. 2. Portal venous gas in the left hepatic lobe. Given extensive contrast reflux this may be related to contrast injection. No bowel wall thickening or pneumatosis. Unfortunately due to poor contrast flow secondary to heart failure evaluation for hypoenhancing bowel wall is limited. 3.  Colonic diverticulosis without diverticulitis. 4. Extensive reflux of contrast into the IVC and hepatic veins compatible with elevated right heart pressures/heart failure. Critical Value/emergent results were called by telephone at the time of interpretation on 03/08/2023 at 5:17 pm to provider [facility] and Dr. Chestine Spore, who verbally acknowledged these results. Electronically Signed   By: Minerva Fester M.D.   On: 03/11/2023 17:24        Scheduled Meds:  cephALEXin  500 mg  Oral BID   diltiazem  120 mg Oral QHS   isosorbide mononitrate  30 mg Oral Daily   rosuvastatin  5 mg Oral QHS   Continuous Infusions:        Glade Lloyd, MD [facility] 02/13/2023, 7:53 AM

## 2023-02-13 NOTE — Plan of Care (Signed)
  Problem: Clinical Measurements: Goal: Diagnostic test results will improve Outcome: Progressing Goal: Cardiovascular complication will be avoided Outcome: Progressing   Problem: Coping: Goal: Level of anxiety will decrease Outcome: Progressing   Problem: Pain Managment: Goal: General experience of comfort will improve Outcome: Progressing   Problem: Safety: Goal: Ability to remain free from injury will improve Outcome: Progressing   Problem: Nutrition: Goal: Adequate nutrition will be maintained Outcome: Not Progressing

## 2023-02-14 ENCOUNTER — Inpatient Hospital Stay (HOSPITAL_COMMUNITY): Payer: Medicare Other

## 2023-02-14 ENCOUNTER — Other Ambulatory Visit: Payer: Self-pay

## 2023-02-14 ENCOUNTER — Inpatient Hospital Stay (HOSPITAL_COMMUNITY): Payer: Medicare Other | Admitting: Certified Registered"

## 2023-02-14 ENCOUNTER — Encounter (HOSPITAL_COMMUNITY): Admission: EM | Disposition: E | Payer: Self-pay | Source: Home / Self Care | Attending: Internal Medicine

## 2023-02-14 ENCOUNTER — Encounter (HOSPITAL_COMMUNITY): Payer: Self-pay | Admitting: Internal Medicine

## 2023-02-14 ENCOUNTER — Encounter: Payer: Self-pay | Admitting: Cardiovascular Disease

## 2023-02-14 DIAGNOSIS — I714 Abdominal aortic aneurysm, without rupture, unspecified: Secondary | ICD-10-CM | POA: Diagnosis not present

## 2023-02-14 DIAGNOSIS — I7143 Infrarenal abdominal aortic aneurysm, without rupture: Secondary | ICD-10-CM

## 2023-02-14 HISTORY — PX: ABDOMINAL AORTIC ENDOVASCULAR STENT GRAFT: SHX5707

## 2023-02-14 HISTORY — PX: ULTRASOUND GUIDANCE FOR VASCULAR ACCESS: SHX6516

## 2023-02-14 LAB — PROTIME-INR
INR: 1.4 — ABNORMAL HIGH (ref 0.8–1.2)
Prothrombin Time: 17.1 s — ABNORMAL HIGH (ref 11.4–15.2)

## 2023-02-14 LAB — BASIC METABOLIC PANEL WITH GFR
Anion gap: 14 (ref 5–15)
BUN: 20 mg/dL (ref 8–23)
CO2: 26 mmol/L (ref 22–32)
Calcium: 8.6 mg/dL — ABNORMAL LOW (ref 8.9–10.3)
Chloride: 94 mmol/L — ABNORMAL LOW (ref 98–111)
Creatinine, Ser: 1 mg/dL (ref 0.44–1.00)
GFR, Estimated: 52 mL/min — ABNORMAL LOW
Glucose, Bld: 107 mg/dL — ABNORMAL HIGH (ref 70–99)
Potassium: 4 mmol/L (ref 3.5–5.1)
Sodium: 134 mmol/L — ABNORMAL LOW (ref 135–145)

## 2023-02-14 LAB — CBC WITH DIFFERENTIAL/PLATELET
Abs Immature Granulocytes: 0.07 10*3/uL (ref 0.00–0.07)
Basophils Absolute: 0 10*3/uL (ref 0.0–0.1)
Basophils Relative: 0 %
Eosinophils Absolute: 0 10*3/uL (ref 0.0–0.5)
Eosinophils Relative: 0 %
HCT: 45.9 % (ref 36.0–46.0)
Hemoglobin: 15.3 g/dL — ABNORMAL HIGH (ref 12.0–15.0)
Immature Granulocytes: 1 %
Lymphocytes Relative: 9 %
Lymphs Abs: 1.3 10*3/uL (ref 0.7–4.0)
MCH: 31.7 pg (ref 26.0–34.0)
MCHC: 33.3 g/dL (ref 30.0–36.0)
MCV: 95 fL (ref 80.0–100.0)
Monocytes Absolute: 1.4 10*3/uL — ABNORMAL HIGH (ref 0.1–1.0)
Monocytes Relative: 10 %
Neutro Abs: 11.5 10*3/uL — ABNORMAL HIGH (ref 1.7–7.7)
Neutrophils Relative %: 80 %
Platelets: 155 10*3/uL (ref 150–400)
RBC: 4.83 MIL/uL (ref 3.87–5.11)
RDW: 14.9 % (ref 11.5–15.5)
WBC: 14.4 10*3/uL — ABNORMAL HIGH (ref 4.0–10.5)
nRBC: 0 % (ref 0.0–0.2)

## 2023-02-14 LAB — BASIC METABOLIC PANEL
Anion gap: 12 (ref 5–15)
BUN: 19 mg/dL (ref 8–23)
CO2: 24 mmol/L (ref 22–32)
Calcium: 7.9 mg/dL — ABNORMAL LOW (ref 8.9–10.3)
Chloride: 99 mmol/L (ref 98–111)
Creatinine, Ser: 0.93 mg/dL (ref 0.44–1.00)
GFR, Estimated: 57 mL/min — ABNORMAL LOW (ref >60)
Glucose, Bld: 163 mg/dL — ABNORMAL HIGH (ref 70–99)
Potassium: 4 mmol/L (ref 3.5–5.1)
Sodium: 135 mmol/L (ref 135–145)

## 2023-02-14 LAB — TYPE AND SCREEN
ABO/RH(D): A POS
Antibody Screen: NEGATIVE

## 2023-02-14 LAB — CBC
HCT: 41.1 % (ref 36.0–46.0)
Hemoglobin: 13.5 g/dL (ref 12.0–15.0)
MCH: 31.4 pg (ref 26.0–34.0)
MCHC: 32.8 g/dL (ref 30.0–36.0)
MCV: 95.6 fL (ref 80.0–100.0)
Platelets: 122 10*3/uL — ABNORMAL LOW (ref 150–400)
RBC: 4.3 MIL/uL (ref 3.87–5.11)
RDW: 14.8 % (ref 11.5–15.5)
WBC: 13.2 10*3/uL — ABNORMAL HIGH (ref 4.0–10.5)
nRBC: 0 % (ref 0.0–0.2)

## 2023-02-14 LAB — SURGICAL PCR SCREEN
MRSA, PCR: NEGATIVE
Staphylococcus aureus: NEGATIVE

## 2023-02-14 LAB — MAGNESIUM
Magnesium: 2.5 mg/dL — ABNORMAL HIGH (ref 1.7–2.4)
Magnesium: 2.2 mg/dL (ref 1.7–2.4)

## 2023-02-14 LAB — APTT
aPTT: 34 s (ref 24–36)
aPTT: 36 s (ref 24–36)

## 2023-02-14 SURGERY — INSERTION, ENDOVASCULAR STENT GRAFT, AORTA, ABDOMINAL
Anesthesia: Monitor Anesthesia Care | Site: Groin

## 2023-02-14 MED ORDER — ORAL CARE MOUTH RINSE: 15.0000 mL | Freq: Once | OROMUCOSAL | Status: AC

## 2023-02-14 MED ORDER — HEPARIN SODIUM (PORCINE) 1000 UNIT/ML IJ SOLN
INTRAMUSCULAR | Status: DC | PRN
Start: 2023-02-14 — End: 2023-02-14
  Administered 2023-02-14: 7000 [IU] via INTRAVENOUS

## 2023-02-14 MED ORDER — SODIUM CHLORIDE 0.9 % IV SOLN: 500.0000 mL | Freq: Once | INTRAVENOUS | Status: DC | PRN

## 2023-02-14 MED ORDER — DEXMEDETOMIDINE HCL IN NACL 80 MCG/20ML IV SOLN
INTRAVENOUS | Status: DC | PRN
Start: 2023-02-14 — End: 2023-02-14
  Administered 2023-02-14 (×4): 4 ug via INTRAVENOUS

## 2023-02-14 MED ORDER — CHLORHEXIDINE GLUCONATE 0.12 % MT SOLN: 15.0000 mL | Freq: Once | OROMUCOSAL | Status: AC

## 2023-02-14 MED ORDER — ONDANSETRON HCL 4 MG/2ML IJ SOLN
INTRAMUSCULAR | Status: AC
  Filled 2023-02-14: qty 2

## 2023-02-14 MED ORDER — HEPARIN 6000 UNIT IRRIGATION SOLUTION
Status: DC | PRN
  Administered 2023-02-14: 1

## 2023-02-14 MED ORDER — ACETAMINOPHEN 500 MG PO TABS
ORAL_TABLET | ORAL | Status: AC
  Administered 2023-02-14: 1000 mg
  Filled 2023-02-14: qty 2

## 2023-02-14 MED ORDER — ONDANSETRON HCL 4 MG/2ML IJ SOLN: 4.0000 mg | Freq: Once | INTRAMUSCULAR | Status: DC | PRN

## 2023-02-14 MED ORDER — PHENYLEPHRINE HCL-NACL 20-0.9 MG/250ML-% IV SOLN
INTRAVENOUS | Status: DC | PRN
  Administered 2023-02-14: 30 ug/min via INTRAVENOUS

## 2023-02-14 MED ORDER — FENTANYL CITRATE (PF) 100 MCG/2ML IJ SOLN: 25.0000 ug | INTRAMUSCULAR | Status: DC | PRN

## 2023-02-14 MED ORDER — PROTAMINE SULFATE 10 MG/ML IV SOLN
INTRAVENOUS | Status: AC
  Filled 2023-02-14: qty 5

## 2023-02-14 MED ORDER — LIDOCAINE-EPINEPHRINE (PF) 1 %-1:200000 IJ SOLN
INTRAMUSCULAR | Status: AC
  Filled 2023-02-14: qty 60

## 2023-02-14 MED ORDER — MAGNESIUM SULFATE 2 GM/50ML IV SOLN: 2.0000 g | Freq: Every day | INTRAVENOUS | Status: DC | PRN

## 2023-02-14 MED ORDER — EPHEDRINE 5 MG/ML INJ
INTRAVENOUS | Status: AC
  Filled 2023-02-14: qty 5

## 2023-02-14 MED ORDER — POTASSIUM CHLORIDE CRYS ER 20 MEQ PO TBCR: 20.0000 meq | EXTENDED_RELEASE_TABLET | Freq: Every day | ORAL | Status: DC | PRN

## 2023-02-14 MED ORDER — DEXAMETHASONE SODIUM PHOSPHATE 10 MG/ML IJ SOLN
INTRAMUSCULAR | Status: AC
  Filled 2023-02-14: qty 1

## 2023-02-14 MED ORDER — LIDOCAINE 2% (20 MG/ML) 5 ML SYRINGE
INTRAMUSCULAR | Status: AC
  Filled 2023-02-14: qty 5

## 2023-02-14 MED ORDER — PROPOFOL 500 MG/50ML IV EMUL
INTRAVENOUS | Status: DC | PRN
Start: 2023-02-14 — End: 2023-02-14
  Administered 2023-02-14: 10 ug/kg/min via INTRAVENOUS

## 2023-02-14 MED ORDER — HEPARIN SODIUM (PORCINE) 1000 UNIT/ML IJ SOLN
INTRAMUSCULAR | Status: AC
  Filled 2023-02-14: qty 10

## 2023-02-14 MED ORDER — DEXAMETHASONE SODIUM PHOSPHATE 10 MG/ML IJ SOLN
INTRAMUSCULAR | Status: DC | PRN
  Administered 2023-02-14: 4 mg via INTRAVENOUS

## 2023-02-14 MED ORDER — DOCUSATE SODIUM 100 MG PO CAPS
100.0000 mg | ORAL_CAPSULE | Freq: Every day | ORAL | Status: DC
  Administered 2023-02-15 – 2023-02-19 (×5): 100 mg via ORAL
  Filled 2023-02-14 (×6): qty 1

## 2023-02-14 MED ORDER — METOPROLOL TARTRATE 5 MG/5ML IV SOLN: 2.0000 mg | INTRAVENOUS | Status: DC | PRN

## 2023-02-14 MED ORDER — CEFAZOLIN SODIUM 1 G IJ SOLR
INTRAMUSCULAR | Status: AC
  Filled 2023-02-14: qty 10

## 2023-02-14 MED ORDER — LIDOCAINE HCL (PF) 1 % IJ SOLN
INTRAMUSCULAR | Status: AC
  Filled 2023-02-14: qty 30

## 2023-02-14 MED ORDER — GUAIFENESIN-DM 100-10 MG/5ML PO SYRP: 15.0000 mL | ORAL_SOLUTION | ORAL | Status: DC | PRN

## 2023-02-14 MED ORDER — OXYCODONE HCL 5 MG/5ML PO SOLN: 5.0000 mg | Freq: Once | ORAL | Status: DC | PRN

## 2023-02-14 MED ORDER — PROPOFOL 10 MG/ML IV BOLUS
INTRAVENOUS | Status: DC | PRN
  Administered 2023-02-14 (×2): 10 mg via INTRAVENOUS

## 2023-02-14 MED ORDER — LIDOCAINE-EPINEPHRINE (PF) 1 %-1:200000 IJ SOLN
INTRAMUSCULAR | Status: DC | PRN
  Administered 2023-02-14: 38 mL

## 2023-02-14 MED ORDER — PHENYLEPHRINE 80 MCG/ML (10ML) SYRINGE FOR IV PUSH (FOR BLOOD PRESSURE SUPPORT)
PREFILLED_SYRINGE | INTRAVENOUS | Status: AC
  Filled 2023-02-14: qty 10

## 2023-02-14 MED ORDER — SODIUM CHLORIDE 0.9 % IV SOLN: INTRAVENOUS | Status: DC

## 2023-02-14 MED ORDER — ALUM & MAG HYDROXIDE-SIMETH 200-200-20 MG/5ML PO SUSP: 15.0000 mL | ORAL | Status: DC | PRN

## 2023-02-14 MED ORDER — FENTANYL CITRATE (PF) 250 MCG/5ML IJ SOLN
INTRAMUSCULAR | Status: AC
  Filled 2023-02-14: qty 5

## 2023-02-14 MED ORDER — PROTAMINE SULFATE 10 MG/ML IV SOLN
INTRAVENOUS | Status: DC | PRN
  Administered 2023-02-14: 40 mg via INTRAVENOUS

## 2023-02-14 MED ORDER — PROPOFOL 10 MG/ML IV BOLUS
INTRAVENOUS | Status: AC
  Filled 2023-02-14: qty 20

## 2023-02-14 MED ORDER — OXYCODONE HCL 5 MG PO TABS: 5.0000 mg | ORAL_TABLET | Freq: Once | ORAL | Status: DC | PRN

## 2023-02-14 MED ORDER — LACTATED RINGERS IV SOLN: INTRAVENOUS | Status: DC

## 2023-02-14 MED ORDER — FENTANYL CITRATE (PF) 250 MCG/5ML IJ SOLN
INTRAMUSCULAR | Status: DC | PRN
  Administered 2023-02-14 (×3): 25 ug via INTRAVENOUS

## 2023-02-14 MED ORDER — ONDANSETRON HCL 4 MG/2ML IJ SOLN
INTRAMUSCULAR | Status: DC | PRN
  Administered 2023-02-14: 4 mg via INTRAVENOUS

## 2023-02-14 MED ORDER — HYDRALAZINE HCL 20 MG/ML IJ SOLN: 5.0000 mg | INTRAMUSCULAR | Status: DC | PRN

## 2023-02-14 MED ORDER — IODIXANOL 320 MG/ML IV SOLN
INTRAVENOUS | Status: DC | PRN
  Administered 2023-02-14: 136 mL

## 2023-02-14 MED ORDER — HEPARIN SODIUM (PORCINE) 5000 UNIT/ML IJ SOLN
5000.0000 [IU] | Freq: Three times a day (TID) | INTRAMUSCULAR | Status: DC
  Administered 2023-02-15: 5000 [IU] via SUBCUTANEOUS
  Filled 2023-02-14: qty 1

## 2023-02-14 MED ORDER — 0.9 % SODIUM CHLORIDE (POUR BTL) OPTIME
TOPICAL | Status: DC | PRN
  Administered 2023-02-14: 1000 mL

## 2023-02-14 MED ORDER — PHENOL 1.4 % MT LIQD: 1.0000 | OROMUCOSAL | Status: DC | PRN

## 2023-02-14 MED ORDER — ACETAMINOPHEN 500 MG PO TABS: 1000.0000 mg | ORAL_TABLET | Freq: Once | ORAL | Status: DC

## 2023-02-14 MED ORDER — HEPARIN 6000 UNIT IRRIGATION SOLUTION
Status: AC
  Filled 2023-02-14: qty 500

## 2023-02-14 MED ORDER — LABETALOL HCL 5 MG/ML IV SOLN
10.0000 mg | INTRAVENOUS | Status: DC | PRN
  Filled 2023-02-14: qty 4

## 2023-02-14 MED ORDER — CEFAZOLIN SODIUM-DEXTROSE 2-3 GM-%(50ML) IV SOLR
INTRAVENOUS | Status: DC | PRN
  Administered 2023-02-14: 1 g via INTRAVENOUS

## 2023-02-14 MED ORDER — CHLORHEXIDINE GLUCONATE 0.12 % MT SOLN
OROMUCOSAL | Status: AC
  Administered 2023-02-14: 15 mL via OROMUCOSAL
  Filled 2023-02-14: qty 15

## 2023-02-14 SURGICAL SUPPLY — 62 items
ADH SKN CLS APL DERMABOND .7 (GAUZE/BANDAGES/DRESSINGS) ×4
BAG COUNTER SPONGE SURGICOUNT (BAG) ×2 IMPLANT
BAG SPNG CNTER NS LX DISP (BAG) ×2
CANISTER SUCT 3000ML PPV (MISCELLANEOUS) ×2 IMPLANT
CATH BEACON 5 .035 65 KMP TIP (CATHETERS) IMPLANT
CATH BEACON 5.038 65CM KMP-01 (CATHETERS) ×2 IMPLANT
CATH OMNI FLUSH .035X70CM (CATHETERS) ×2 IMPLANT
CLOSURE PERCLOSE PROSTYLE (VASCULAR PRODUCTS) IMPLANT
COVER BACK TABLE 60X90IN (DRAPES) IMPLANT
COVER DOME SNAP 22 D (MISCELLANEOUS) IMPLANT
COVER PROBE W GEL 5X96 (DRAPES) IMPLANT
DERMABOND ADVANCED .7 DNX12 (GAUZE/BANDAGES/DRESSINGS) ×2 IMPLANT
DEVICE CLOSURE PERCLS PRGLD 6F (VASCULAR PRODUCTS) ×8 IMPLANT
DEVICE TORQUE KENDALL .025-038 (MISCELLANEOUS) IMPLANT
DRSG TEGADERM 2-3/8X2-3/4 SM (GAUZE/BANDAGES/DRESSINGS) ×4 IMPLANT
ELECT REM PT RETURN 9FT ADLT (ELECTROSURGICAL) ×4
ELECTRODE REM PT RTRN 9FT ADLT (ELECTROSURGICAL) ×4 IMPLANT
EXCLDR TRNK ENDO 26X14.5X12 16 (Endovascular Graft) ×2 IMPLANT
EXCLUDER TNK END 26X14.5X12 16 (Endovascular Graft) IMPLANT
GAUZE SPONGE 2X2 8PLY STRL LF (GAUZE/BANDAGES/DRESSINGS) ×2 IMPLANT
GLOVE BIO SURGEON STRL SZ7.5 (GLOVE) ×2 IMPLANT
GLOVE BIOGEL PI IND STRL 6.5 (GLOVE) IMPLANT
GLOVE BIOGEL PI IND STRL 8 (GLOVE) ×2 IMPLANT
GLOVE SURG SS PI 6.0 STRL IVOR (GLOVE) IMPLANT
GOWN STRL REUS W/ TWL LRG LVL3 (GOWN DISPOSABLE) ×6 IMPLANT
GOWN STRL REUS W/ TWL XL LVL3 (GOWN DISPOSABLE) ×2 IMPLANT
GOWN STRL REUS W/TWL LRG LVL3 (GOWN DISPOSABLE) ×6
GOWN STRL REUS W/TWL XL LVL3 (GOWN DISPOSABLE) ×4
GRAFT BALLN CATH 65CM (BALLOONS) ×2 IMPLANT
GUIDEWIRE ANGLED .035X150CM (WIRE) IMPLANT
KIT BASIN OR (CUSTOM PROCEDURE TRAY) ×2 IMPLANT
KIT TURNOVER KIT B (KITS) ×2 IMPLANT
LEG CONTRALATERAL 16X14.5X12 (Vascular Products) IMPLANT
LEG CONTRALATERAL 16X16X9.5 (Endovascular Graft) IMPLANT
NS IRRIG 1000ML POUR BTL (IV SOLUTION) ×2 IMPLANT
PACK ENDOVASCULAR (PACKS) ×2 IMPLANT
PAD ARMBOARD 7.5X6 YLW CONV (MISCELLANEOUS) ×4 IMPLANT
PERCLOSE PROGLIDE 6F (VASCULAR PRODUCTS) ×8
PROTECTION STATION PRESSURIZED (MISCELLANEOUS) ×2
SET MICROPUNCTURE 5F STIFF (MISCELLANEOUS) ×2 IMPLANT
SHEATH BRITE TIP 8FR 23CM (SHEATH) ×2 IMPLANT
SHEATH DRYSEAL FLEX 12FR 33CM (SHEATH) IMPLANT
SHEATH DRYSEAL FLEX 16FR 33CM (SHEATH) IMPLANT
SHEATH PINNACLE 8F 10CM (SHEATH) ×2 IMPLANT
STATION PROTECTION PRESSURIZED (MISCELLANEOUS) IMPLANT
STENT GRAFT CONTRALAT 16X13.5 (Endovascular Graft) IMPLANT
STOPCOCK 3WAY HIGH PRESSURE (MISCELLANEOUS) IMPLANT
STOPCOCK MORSE 400PSI 3WAY (MISCELLANEOUS) ×2 IMPLANT
SUT MNCRL AB 4-0 PS2 18 (SUTURE) ×2 IMPLANT
SYR 10ML LL (SYRINGE) IMPLANT
SYR 20CC LL (SYRINGE) IMPLANT
SYR 20ML LL LF (SYRINGE) ×2 IMPLANT
SYR 30ML LL (SYRINGE) IMPLANT
SYR CONTROL 10ML LL (SYRINGE) IMPLANT
SYR MEDRAD MARK V 150ML (SYRINGE) IMPLANT
TOWEL GREEN STERILE (TOWEL DISPOSABLE) ×2 IMPLANT
TRAY FOLEY MTR SLVR 16FR STAT (SET/KITS/TRAYS/PACK) ×2 IMPLANT
TUBING HIGH PRESSURE 120CM (CONNECTOR) ×2 IMPLANT
TUBING INJECTOR 48 (MISCELLANEOUS) IMPLANT
WIRE AMPLATZ SS-J .035X180CM (WIRE) ×4 IMPLANT
WIRE BENTSON .035X145CM (WIRE) ×4 IMPLANT
WIRE STIFF LUNDERQUIST 260CM (WIRE) IMPLANT

## 2023-02-14 NOTE — Progress Notes (Signed)
[facility]  Subjective  -states she still wants surgery today.   Objective 117/75 70 98.6 F (37 C) (Oral) 18 96%  Intake/Output Summary (Last 24 hours) at 02/20/2023 0931 Last data filed at 02/13/2023 1700 Gross per 24 hour  Intake 840 ml  Output 2 ml  Net 838 ml    Bilateral femoral pulses palpable Bilateral DP pulses palpable  Laboratory Lab Results: Recent Labs    02/13/23 0449 03/13/2023 0415  WBC 12.6* 14.4*  HGB 14.4 15.3*  HCT 44.2 45.9  PLT 139* 155   BMET Recent Labs    02/13/23 0449 03/03/2023 0415  NA 136 134*  K 3.9 4.0  CL 98 94*  CO2 27 26  GLUCOSE 107* 107*  BUN 19 20  CREATININE 0.88 1.00  CALCIUM 8.6* 8.6*    COAG Lab Results  Component Value Date   INR 1.3 (H) 02/13/2023   INR 1.9 (H) 02/06/2021   INR 1.4 (H) 02/05/2021   No results found for: "PTT"  Assessment/Planning:  89 year old female now with a 7.8 cm infrarenal abdominal aortic aneurysm considered symptomatic.  We had a long and extensive discussion and she now wants aneurysm repair.  I have talked with her and her son on multiple occasions.  She understands that she has very high risk given her age and comorbidities including significant heart failure and valvular disease with aortic stenosis.  Will try to do this awake.  If she requires general anesthesia will be much higher risk for a poor outcome.  I have discussed risks of vessel injury, bleeding, renal failure including dialysis, stroke, heart attack, possible death.  She does not feel she can tolerate her pain and does not like taking pain medicine.  Cephus Shelling 02/19/2023 9:31 AM --

## 2023-02-14 NOTE — Progress Notes (Signed)
PROGRESS NOTE    [name]  OVF:[id number] DOB: Oct 10, 1925 DOA: 02/28/2023 PCP: [name], MD   Brief Narrative:  89 y.o. female with medical history significant of abdominal aortic aneurysm (previously followed with Dr. Florene Route surgery: In 2022, aneurysm size was 6.2, patient had declined surgical workup and was no longer following up with vascular surgery), persistent A-fib on Pradaxa, complete heart block status post pacemaker, moderate CAD without obstructive lesions, COPD, aortic insufficiency, type a dissection of descending thoracic aorta, hypertension, hyperlipidemia presented with worsening abdominal pain. She had presented to the ED with abdominal and back pain few days ago. CT showed increased size of abdominal aortic aneurysm up to 7.8 cm without rupture. Patient declined any intervention. She also had evidence of UTI and was discharged on oral Keflex and pain meds. Patient subsequently has changed her mind and wanted to speak to vascular surgery for possible surgical intervention.  On presentation, vascular surgery was consulted.  Assessment & Plan:   Enlarging AAA presenting with worsening abdominal pain -previously followed with Dr. Florene Route surgery: In 2022, aneurysm size was 6.2, patient had declined surgical workup and was no longer following up with vascular surgery -Recent CT had shown increased size of abdominal aortic aneurysm up to 7.8 cm without rupture.  Patient declined any intervention. - Patient subsequently changed her mind and now wants surgical intervention. -Repeat CT angio on team 224 showed increased size of infrarenal abdominal arctic aneurysm measuring 7.8 x 7.8 cm.  Vascular surgery following and planning for possible EVAR today.  Chronic diastolic heart failure -Compensated.  Strict input and output.  Daily weights.  Fluid restriction.  Continue Lasix, Imdur and metoprolol. -Echo showed EF of 60 to 65% with moderate to severe aortic  valve stenosis.  Doubt that she will be a candidate for any surgical intervention.  Follow palliative care discussions.  Leukocytosis -Monitor   Thrombocytopenia -Questionable cause.  Monitor   Hyponatremia -Mild.   Hypertension -Monitor blood pressure.  Continue Cardizem, Imdur, Lasix and metoprolol.   Persistent A-fib -Pradaxa on hold.  Continue Cardizem and monitor well.  Outpatient follow-up with cardiology   History of complete heart block status post pacemaker -Outpatient follow-up with cardiology   Moderate CAD without obstructing lesions -Stable.  Outpatient follow-up with cardiology   Hyperlipidemia -Continue statin   Recent diagnosis of UTI -Had E. coli growing in recent urine cultures from 02/09/2023.  Continue Keflex to finish course.   Goals of care -Palliative care following.     DVT prophylaxis: SCDs Code Status: DNR Family Communication: Son at bedside on 02/13/2023 Disposition Plan: Status is: Inpatient Remains inpatient appropriate because: Of severity of illness  Consultants: Vascular surgery/palliative care  Procedures: None  Antimicrobials: Keflex  Subjective: Patient seen and examined at bedside.  Complains of intermittent lower abdominal pain.  No chest pain or worsening shortness of breath reported. Objective: Vitals:   02/13/23 2033 2023-02-16 0438 02-16-23 0500 02-16-23 0810  BP: 130/85 (!) 169/95  137/83  Pulse: 69 70  71  Resp: 20   17  Temp: 98.4 F (36.9 C)     TempSrc: Oral     SpO2: 97% 97%  96%  Weight:   54.2 kg   Height:        Intake/Output Summary (Last 24 hours) at Feb 16, 2023 0829 Last data filed at 02/13/2023 1700 Gross per 24 hour  Intake 840 ml  Output 2 ml  Net 838 ml   Filed Weights   03/01/2023 1215 16-Feb-2023 0500  Weight:  53.1 kg 54.2 kg    Examination:  General: Currently on room air.  No acute distress.  Elderly female lying in bed. ENT/neck: No thyromegaly.  JVD is not elevated  respiratory: Decreased  breath sounds at bases bilaterally with some crackles; no wheezing  CVS: S1-S2 heard, rate controlled Abdominal: Soft, nontender, slightly distended; no organomegaly, normal bowel sounds are heard Extremities: Mild lower extremity edema present bilaterally; no clubbing CNS: Poor historian.  Extremely slow to respond.  No focal neurologic deficit.  Moves extremities Lymph: No obvious lymphadenopathy Skin: No obvious ecchymosis/lesions  psych: Extremely flat affect.  Not agitated. musculoskeletal: No obvious joint swelling/deformity    Data Reviewed: I have personally reviewed following labs and imaging studies  CBC: Recent Labs  Lab 02/09/23 0051 02/09/23 0101 02/24/2023 1241 02/13/23 0449 03/02/2023 0415  WBC 18.7*  --  13.6* 12.6* 14.4*  NEUTROABS 15.7*  --   --  10.4* 11.5*  HGB 15.6* 16.7* 14.6 14.4 15.3*  HCT 47.8* 49.0* 45.9 44.2 45.9  MCV 93.5  --  96.2 93.1 95.0  PLT 170  --  145* 139* 155   Basic Metabolic Panel: Recent Labs  Lab 02/09/23 0051 02/09/23 0101 02/20/2023 1241 02/13/23 0449 02/13/2023 0415  NA 136 136 134* 136 134*  K 3.9 3.7 3.7 3.9 4.0  CL 97* 99 95* 98 94*  CO2 23  --  26 27 26   GLUCOSE 121* 119* 117* 107* 107*  BUN 28* 31* 22 19 20   CREATININE 0.87 0.90 0.92 0.88 1.00  CALCIUM 9.3  --  8.5* 8.6* 8.6*  MG  --   --   --  2.4 2.5*   GFR: Estimated Creatinine Clearance: 26.6 mL/min (by C-G formula based on SCr of 1 mg/dL). Liver Function Tests: Recent Labs  Lab 02/09/23 0051 02/22/2023 1241 02/13/23 0449  AST 52* 36 28  ALT 59* 43 34  ALKPHOS 142* 137* 125  BILITOT 1.0 1.5* 1.9*  PROT 6.6 5.7* 5.6*  ALBUMIN 3.7 3.2* 3.0*   Recent Labs  Lab 02/09/23 0051  LIPASE 30   No results for input(s): "AMMONIA" in the last 168 hours. Coagulation Profile: Recent Labs  Lab 02/13/23 0449  INR 1.3*   Cardiac Enzymes: No results for input(s): "CKTOTAL", "CKMB", "CKMBINDEX", "TROPONINI" in the last 168 hours. BNP (last 3 results) No results for  input(s): "PROBNP" in the last 8760 hours. HbA1C: No results for input(s): "HGBA1C" in the last 72 hours. CBG: No results for input(s): "GLUCAP" in the last 168 hours. Lipid Profile: No results for input(s): "CHOL", "HDL", "LDLCALC", "TRIG", "CHOLHDL", "LDLDIRECT" in the last 72 hours. Thyroid Function Tests: No results for input(s): "TSH", "T4TOTAL", "FREET4", "T3FREE", "THYROIDAB" in the last 72 hours. Anemia Panel: No results for input(s): "VITAMINB12", "FOLATE", "FERRITIN", "TIBC", "IRON", "RETICCTPCT" in the last 72 hours. Sepsis Labs: No results for input(s): "PROCALCITON", "LATICACIDVEN" in the last 168 hours.  Recent Results (from the past 240 hour(s))  Urine Culture     Status: Abnormal   Collection Time: 02/09/23 12:41 AM   Specimen: Urine, Random  Result Value Ref Range Status   Specimen Description URINE, RANDOM  Final   Special Requests   Final    URINE, CLEAN CATCH Performed at [facility], [address] [place], [place] [address]    Culture >=100,000 COLONIES/mL ESCHERICHIA COLI (A)  Final   Report Status 02/11/2023 FINAL  Final   Organism ID, Bacteria ESCHERICHIA COLI (A)  Final      Susceptibility  Escherichia coli - MIC*    AMPICILLIN <=2 SENSITIVE Sensitive     CEFAZOLIN <=4 SENSITIVE Sensitive     CEFEPIME <=0.12 SENSITIVE Sensitive     CEFTRIAXONE <=0.25 SENSITIVE Sensitive     CIPROFLOXACIN <=0.25 SENSITIVE Sensitive     GENTAMICIN <=1 SENSITIVE Sensitive     IMIPENEM <=0.25 SENSITIVE Sensitive     NITROFURANTOIN <=16 SENSITIVE Sensitive     TRIMETH/SULFA <=20 SENSITIVE Sensitive     AMPICILLIN/SULBACTAM <=2 SENSITIVE Sensitive     PIP/TAZO <=4 SENSITIVE Sensitive     * >=100,000 COLONIES/mL ESCHERICHIA COLI         Radiology Studies: ECHOCARDIOGRAM COMPLETE  Result Date: 02/13/2023    ECHOCARDIOGRAM REPORT   Patient Name:   [name] Date of Exam: 02/13/2023 Medical Rec #:  [id number]     Height:       62.0 in Accession #:     [id number]    Weight:       117.1 lb Date of Birth:  May 26, 1925      BSA:          1.523 m Patient Age:    89 years      BP:           148/94 mmHg Patient Gender: F             HR:           70 bpm. Exam Location:  Inpatient Procedure: 2D Echo, Cardiac Doppler and Color Doppler Indications:    Preoperative evaluation  History:        Patient has prior history of Echocardiogram examinations, most                 recent 09/18/2022. CHF, CAD, Pacemaker, Stroke and COPD,                 Arrythmias:Atrial Fibrillation, RBBB, Bradycardia and                 Tachycardia, Signs/Symptoms:Syncope, Chest Pain and Dyspnea;                 Risk Factors:Hypertension.  Sonographer:    Lucendia Herrlich RCS Referring Phys: [id number] CHRISTOPHER J CLARK IMPRESSIONS  1. Left ventricular ejection fraction, by estimation, is 60 to 65%. The left ventricle has normal function. The left ventricle has no regional wall motion abnormalities. There is moderate asymmetric left ventricular hypertrophy of the septal segment. Left ventricular diastolic parameters are indeterminate.  2. Right ventricular systolic function is normal. The right ventricular size is mildly enlarged. There is moderately elevated pulmonary artery systolic pressure. The estimated right ventricular systolic pressure is 59.0 mmHg.  3. Left atrial size was severely dilated.  4. Right atrial size was severely dilated.  5. The mitral valve is degenerative. Mild mitral valve regurgitation. Mild mitral stenosis. The mean mitral valve gradient is 4.4 mmHg. Moderate mitral annular calcification.  6. The tricuspid valve is abnormal. Tricuspid valve regurgitation is moderate to severe. Hepatic vein systolic doppler flow reversal was seen, this is consistent with severe TR.  7. The aortic valve is tricuspid. There is severe calcification of the aortic valve. Aortic valve regurgitation is mild. Moderate to severe paradoxical low flow/low gradient aortic valve stenosis (visually could be  severe AS). Aortic valve area, by VTI measures 0.91 cm. Aortic valve mean gradient measures 16.0 mmHg.  8. The inferior vena cava is dilated in size with >50% respiratory variability, suggesting right atrial pressure of 8 mmHg.  9.  Aortic dilatation noted. There is moderate dilatation of the ascending aorta, measuring 44 mm. FINDINGS  Left Ventricle: Left ventricular ejection fraction, by estimation, is 60 to 65%. The left ventricle has normal function. The left ventricle has no regional wall motion abnormalities. The left ventricular internal cavity size was normal in size. There is  moderate asymmetric left ventricular hypertrophy of the septal segment. Left ventricular diastolic parameters are indeterminate. Right Ventricle: The right ventricular size is mildly enlarged. No increase in right ventricular wall thickness. Right ventricular systolic function is normal. There is moderately elevated pulmonary artery systolic pressure. The tricuspid regurgitant velocity is 3.57 m/s, and with an assumed right atrial pressure of 8 mmHg, the estimated right ventricular systolic pressure is 59.0 mmHg. Left Atrium: Left atrial size was severely dilated. Right Atrium: Right atrial size was severely dilated. Pericardium: There is no evidence of pericardial effusion. Mitral Valve: The mitral valve is degenerative in appearance. Moderate mitral annular calcification. Mild mitral valve regurgitation. Mild mitral valve stenosis. MV peak gradient, 14.5 mmHg. The mean mitral valve gradient is 4.4 mmHg. Tricuspid Valve: The tricuspid valve is abnormal. Tricuspid valve regurgitation is moderate to severe. Aortic Valve: The aortic valve is tricuspid. There is severe calcifcation of the aortic valve. Aortic valve regurgitation is mild. Aortic regurgitation PHT measures 547 msec. Moderate to severe aortic stenosis is present. Aortic valve mean gradient measures 16.0 mmHg. Aortic valve peak gradient measures 29.1 mmHg. Aortic valve area,  by VTI measures 0.91 cm. Pulmonic Valve: The pulmonic valve was normal in structure. Pulmonic valve regurgitation is not visualized. Aorta: The aortic root is normal in size and structure and aortic dilatation noted. There is moderate dilatation of the ascending aorta, measuring 44 mm. Venous: The inferior vena cava is dilated in size with greater than 50% respiratory variability, suggesting right atrial pressure of 8 mmHg. IAS/Shunts: No atrial level shunt detected by color flow Doppler. Additional Comments: A device lead is visualized in the right ventricle.  LEFT VENTRICLE PLAX 2D LVIDd:         2.60 cm   Diastology LVIDs:         1.75 cm   LV e' medial:    7.29 cm/s LV PW:         1.00 cm   LV E/e' medial:  24.4 LV IVS:        1.50 cm   LV e' lateral:   8.05 cm/s LVOT diam:     1.98 cm   LV E/e' lateral: 22.1 LV SV:         43 LV SV Index:   29 LVOT Area:     3.08 cm  RIGHT VENTRICLE             IVC RV S prime:     13.30 cm/s  IVC diam: 2.00 cm TAPSE (M-mode): 1.6 cm LEFT ATRIUM             Index        RIGHT ATRIUM           Index LA diam:        5.10 cm 3.35 cm/m   RA Area:     25.70 cm LA Vol (A2C):   70.2 ml 46.11 ml/m  RA Volume:   74.80 ml  49.13 ml/m LA Vol (A4C):   91.6 ml 60.16 ml/m LA Biplane Vol: 82.4 ml 54.12 ml/m  AORTIC VALVE AV Area (Vmax):    0.92 cm AV Area (Vmean):   0.88  cm AV Area (VTI):     0.91 cm AV Vmax:           269.67 cm/s AV Vmean:          175.667 cm/s AV VTI:            0.477 m AV Peak Grad:      29.1 mmHg AV Mean Grad:      16.0 mmHg LVOT Vmax:         80.17 cm/s LVOT Vmean:        50.133 cm/s LVOT VTI:          0.141 m LVOT/AV VTI ratio: 0.30 AI PHT:            547 msec  AORTA Ao Root diam: 3.10 cm Ao Asc diam:  4.40 cm MITRAL VALVE                TRICUSPID VALVE MV Area (PHT): 3.91 cm     TR Peak grad:   51.0 mmHg MV Peak grad:  14.5 mmHg    TR Vmax:        357.00 cm/s MV Mean grad:  4.4 mmHg MV Vmax:       1.90 m/s     SHUNTS MV Vmean:      117.5 cm/s   Systemic VTI:   0.14 m MV Decel Time: 194 msec     Systemic Diam: 1.98 cm MR Peak grad: 81.0 mmHg MR Vmax:      450.00 cm/s MV E velocity: 178.00 cm/s MV A velocity: 49.10 cm/s MV E/A ratio:  3.63 Dalton McleanMD Electronically signed by Wilfred Lacy Signature Date/Time: 02/13/2023/9:46:33 AM    Final    CT Angio Abd/Pel W and/or Wo Contrast  Result Date: 02/18/2023 CLINICAL DATA:  Abdominal aortic aneurysm, preop planning EXAM: CTA ABDOMEN AND PELVIS WITHOUT AND WITH CONTRAST TECHNIQUE: Multidetector CT imaging of the abdomen and pelvis was performed using the standard protocol during bolus administration of intravenous contrast. Multiplanar reconstructed images and MIPs were obtained and reviewed to evaluate the vascular anatomy. RADIATION DOSE REDUCTION: This exam was performed according to the departmental dose-optimization program which includes automated exposure control, adjustment of the mA and/or kV according to patient size and/or use of iterative reconstruction technique. CONTRAST:  75mL OMNIPAQUE IOHEXOL 350 MG/ML SOLN COMPARISON:  None Available. FINDINGS: VASCULAR Aorta: The abdominal aorta and its branches are not well opacified likely due to heart failure as a significant portion of the contrast fills the IVC and hepatic veins. Increased size of the infrarenal abdominal aortic aneurysm now measuring 7.8 x 7.8 cm, previously 7.8 x 7.6 cm there is new hazy fat stranding about the aneurysm concerning for impending rupture. Calcified atherosclerotic plaque. Celiac: Patency is not well evaluated due to slow flow. SMA: Patency is not well evaluated due to slow flow. Renals: Patency is not well evaluated due to slow flow. IMA: Patency is not well evaluated due to slow flow. Inflow: Patency is not well evaluated due to slow flow. Proximal Outflow: Patency is not well evaluated due to slow flow. Veins: Portal venous gas in the left hepatic lobe. Review of the MIP images confirms the above findings. NON-VASCULAR Lower  chest: No acute abnormality. Hepatobiliary: Cholecystectomy. Pancreas: Unremarkable. Spleen: Unremarkable. Adrenals/Urinary Tract: Stable adrenal glands and kidneys. No obstructing urinary calculi or hydronephrosis. Unremarkable bladder. Stomach/Bowel: Normal caliber large and small bowel. Moderate colonic stool load. Colonic diverticulosis without diverticulitis. No wall thickening or pneumatosis to suggest source of portal  venous gas in the left hepatic lobe. Unfortunately due to poor contrast flow secondary to heart failure evaluation for hypoenhancing bowel wall is not possible. Lymphatic: No lymphadenopathy. Reproductive: Uterus and bilateral adnexa are unremarkable. Other: No free intraperitoneal air. Trace free fluid in the pelvis is low-density. Musculoskeletal: No acute fracture. Remote inferior pubic rami fractures. Chronic compression fracture of L3 status post vertebroplasty. IMPRESSION: 1. Increased size of the infrarenal abdominal aortic aneurysm now measuring 7.8 x 7.8 cm, previously 7.8 x 7.6 cm. There is new hazy fat stranding about the aneurysm concerning for impending rupture. 2. Portal venous gas in the left hepatic lobe. Given extensive contrast reflux this may be related to contrast injection. No bowel wall thickening or pneumatosis. Unfortunately due to poor contrast flow secondary to heart failure evaluation for hypoenhancing bowel wall is limited. 3.  Colonic diverticulosis without diverticulitis. 4. Extensive reflux of contrast into the IVC and hepatic veins compatible with elevated right heart pressures/heart failure. Critical Value/emergent results were called by telephone at the time of interpretation on 02/25/2023 at 5:17 pm to provider [facility] and Dr. Chestine Spore, who verbally acknowledged these results. Electronically Signed   By: Minerva Fester M.D.   On: 02/27/2023 17:24        Scheduled Meds:  cephALEXin  500 mg Oral BID   diltiazem  120 mg Oral QHS   furosemide  40 mg  Oral Daily   isosorbide mononitrate  30 mg Oral Daily   metoprolol succinate  25 mg Oral Daily   rosuvastatin  5 mg Oral QHS   Continuous Infusions:        Glade Lloyd, MD [facility] 03/06/2023, 8:29 AM

## 2023-02-14 NOTE — Progress Notes (Signed)
[employer] was notified about surgery this AM.

## 2023-02-14 NOTE — Discharge Instructions (Signed)
  [facility] [place]   Discharge Instructions  Endovascular Aortic Aneurysm Repair  Please refer to the following instructions for your post-procedure care. Your surgeon or Physician Assistant will discuss any changes with you.  Activity  You are encouraged to walk as much as you can. You can slowly return to normal activities but must avoid strenuous activity and heavy lifting until your doctor tells you it's OK. Avoid activities such as vacuuming or swinging a gold club. It is normal to feel tired for several weeks after your surgery. Do not drive until your doctor gives the OK and you are no longer taking prescription pain medications. It is also normal to have difficulty with sleep habits, eating, and bowel movements after surgery. These will go away with time.  Bathing/Showering  Shower daily after you go home.  Do not soak in a bathtub, hot tub, or swim until the incision heals completely.  If you have incisions in your groin, wash the groin wounds with soap and water daily and pat dry. (No tub bath-only shower)  Then put a dry gauze or washcloth there to keep this area dry to help prevent wound infection daily and as needed.  Do not use Vaseline or neosporin on your incisions.  Only use soap and water on your incisions and then protect and keep dry.  Incision Care  Shower every day. Clean your incision with mild soap and water. Pat the area dry with a clean towel. You do not need a bandage unless otherwise instructed. Do not apply any ointments or creams to your incision. If you clothing is irritating, you may cover your incision with a dry gauze pad.  Diet  Resume your normal diet. There are no special food restrictions following this procedure. A low fat/low cholesterol diet is recommended for all patients with vascular disease. In order to heal from your surgery, it is CRITICAL to get adequate nutrition. Your body requires vitamins, minerals, and protein.  Vegetables are the best source of vitamins and minerals. Vegetables also provide the perfect balance of protein. Processed food has little nutritional value, so try to avoid this.  Medications  Resume taking all of your medications unless your doctor or nurse practitioner tells you not to. If your incision is causing pain, you may take over-the-counter pain relievers such as acetaminophen (Tylenol). If you were prescribed a stronger pain medication, please be aware these medications can cause nausea and constipation. Prevent nausea by taking the medication with a snack or meal. Avoid constipation by drinking plenty of fluids and eating foods with a high amount of fiber, such as fruits, vegetables, and grains.  Do not take Tylenol if you are taking prescription pain medications.   Follow up  Our office will schedule a follow-up appointment with a CT scan 3-4 weeks after your surgery.  Please call us immediately for any of the following conditions  Severe or worsening pain in your legs or feet or in your abdomen back or chest. Increased pain, redness, drainage (pus) from your incision site. Increased abdominal pain, bloating, nausea, vomiting or persistent diarrhea. Fever of 101 degrees or higher. Swelling in your leg (s),  Reduce your risk of vascular disease  Stop smoking. If you would like help call [facility] at 1-800-QUIT-NOW (1-800-784-8669) or [place] at 336-586-4000. Manage your cholesterol Maintain a desired weight Control your diabetes Keep your blood pressure down  If you have questions, please call the office at [phone].  

## 2023-02-14 NOTE — Progress Notes (Signed)
PERIOPERATIVE PRESCRIPTION FOR IMPLANTED CARDIAC DEVICE PROGRAMMING  Patient Information: Name:  [name]  DOB:  June 07, 1925  MRN:  [id number]  Planned Procedure:  Abdominal Aortic Endovascular Stent Graft  Surgeon:  Dr. Sherald Hess  Date of Procedure:  02/25/2023   Cautery will be used.  Position during surgery:  Supine  Device Information:  Clinic EP Physician:  Dr. Royann Shivers  Device Type:  Pacemaker Manufacturer and Phone #:  [employer]: [phone] Pacemaker Dependent?:  Yes.   Date of Last Device Check:  02/03/2023 Normal Device Function?:  Yes.    Electrophysiologist's Recommendations:  Have magnet available. Provide continuous ECG monitoring when magnet is used or reprogramming is to be performed.  Procedure will likely interfere with device function.  Device should be programmed:  Asynchronous pacing during procedure and returned to normal programming after procedure  Per Device Clinic Standing Orders, Wiliam Ke, RN  8:05 AM 03/05/2023

## 2023-02-14 NOTE — Op Note (Signed)
Date: 03-05-23  Preoperative diagnosis: 7.8 cm infrarenal abdominal aortic aneurysm  Postoperative diagnosis: Same  Procedure: 1.  Ultrasound-guided access bilateral common femoral arteries for delivery of Perclose closure device 2.  Abdominal aortogram with iliac arteriogram including catheter selection of aorta 3.  Aortobiiliac stent graft for repair of abdominal aortic aneurysm (Gore Excluder)  Surgeon: Dr. Cephus Shelling, MD  Assistant: Dr. Lemar Livings, MD  Indications: 89 year old female that presented with symptomatic 7.8 cm abdominal aortic aneurysm.  In the past she has declined elective repair after multiple conversations.  She now has changed her mind and wants to proceed with endovascualr repair.  She presents after risks benefits discussed.  An assistant was needed given the complexity of the case and also for wire exchanges and deploying the endograft.  Findings: Ultrasound-guided access of bilateral common femoral arteries with delivery of Perclose closure devices.  Main body device was delivered on the left with a 16 [place] Gore dry seal sheath and a 12 [place] Gore dry seal was placed on the right.  Main body was a 26 mm x 14 mm x 12 cm main body left.  On the left we used a bell bottom using a 14 mm x 12 cm into the common iliac artery with preservation of the hypogastric.  On the right we initially used a 16 mm x 14 cm bell bottom with preservation of the hypogastric.  There was initially a type Ia endoleak with aggressive MOB ballooning we were able to resolve this Ia endoleak at completion.  We did have to extend in the right common iliac with an additional limb using a 16 mm x 10 cm with preservation of the hypogastric.  Widely patent endograft at completion with no endoleak.  Anesthesia: General  EBL: 150 mL  Details: Patient was taken to the operating room after informed consent was obtained.  Patient underwent MAC anesthesia and was awake.  Bilateral groins  and the abdominal wall were prepped and draped in standard sterile fashion.  Timeout was performed and antibiotics were given.  Initially used 1% lidocaine with epinephrine and performed local anesthesia in both groins.  We then used ultrasound to evaluate the right common femoral artery, it was patent, an image was saved.  This was accessed with a micro access needle, placed a microwire, and then a microsheath.  I then placed initially a Bentson wire and had to change to a Glidewire and then dilated with an 8 [place] dilator and placed one Perclose device in the right common femoral artery and then placed a long 8 [place] sheath.  We then did the same steps in the left groin again evaluating this with ultrasound access in the common femoral artery and this was accessed with micro access needle, microwire, and then a micro sheath.  I then used a Bentson wire and and placed an 8 [place] dilator and then performed Perclose device closure at 10:00 and 2:00 in the left common femoral artery and then placed a short 8 [place] sheath.  Patient is given 100 units/kg IV heparin.  We then exchanged for stiff Amplatz wires in both groins and then upsized to a 12 [place] Gore dry seal sheath in the right common femoral artery that was placed into the aneurysm.  We could not get the 16 [place] Gore dry seal to advance on the left so we had to exchanged for a double curve [place] for more support and eventually got the sheath to advance into the aneurysm.  We then delivered the main body device up the left with a pigtail catheter up the right and shot abdominal aortogram and identified the lowest left renal.  We then deployed the main body down to the gate just below the left renal with the graft into the neck of the aneurysm.  We then used a buddy wire and came from the right groin with a KMP catheter and Glidewire and I was able to cannulate the gate of the main body endograft and then confirm I was in the true lumen by spending the  pigtail catheter.  We then put an Amplatz wire up and then got a retrograde shot on the right and then deployed a 16 mm x 14 cm limb from the main body down into the right common iliac with preservation of the hypogastric with the help of my assistant.  We then finished deploying the main body of the graft on the left side and then got a retrograde sheath shot on the left identifying the hypogastric and then deployed a 14 mm x 12 cm limb into the left common iliac artery with preservation of the hypogastric.  We then used a MOB balloon and performed balloon angioplasty of all proximal distal landing zones and overlapping components of the graft.  Abdominal aortogram showed a type Ia endoleak.  Wie went back up with the MOB balloon and retreated the proximal.  We repeated the angiogram and again had a type Ia endoleak.  I then went up a third time and was very aggressive and finally had good seal proximally.  It was noted that our limb on the right was short into the common iliac artery so we extended on the right with a second 16 mm x 10 cm limb with preservation of the hypogastric.  At completion there was no endoleak with widely patent stent graft and treatment of her aneurysm.  We then removed the right femoral sheath and tied down the Perclose device with good seal with the help of my assistant.  We then did the same on the left removing the sheath and tying down the Perclose devices at 10:00 and 2:00 clock with excellent seal.  Palpable femoral pulses.  Doppler signals in the feet.  Protamine was given for reversal.  I cut stitches in both groins and these were closed with 4-0 Monocryl and Dermabond.  Taken to recovery in stable condition.  Complication: None  Condition: Stable  Cephus Shelling, MD [facility] Office: [phone]   Cephus Shelling

## 2023-02-14 NOTE — Anesthesia Preprocedure Evaluation (Addendum)
Anesthesia Evaluation  Patient identified by MRN, date of birth, ID band Patient awake    Reviewed: Allergy & Precautions, NPO status , Patient's Chart, lab work & pertinent test results, reviewed documented beta blocker date and time   History of Anesthesia Complications Negative for: history of anesthetic complications  Airway Mallampati: II   Neck ROM: Full    Dental  (+) Dental Advisory Given, Edentulous Upper, Missing   Pulmonary COPD, former smoker   Pulmonary exam normal        Cardiovascular hypertension, Pt. on medications and Pt. on home beta blockers pulmonary hypertension+ CAD and + Peripheral Vascular Disease  Normal cardiovascular exam+ dysrhythmias Atrial Fibrillation + pacemaker + Valvular Problems/Murmurs AS    '24 TTE - EF 60 to 65%. There is moderate asymmetric left ventricular hypertrophy of the septal segment. The right ventricular size is mildly enlarged. There is moderately elevated pulmonary artery systolic pressure. The estimated right ventricular systolic pressure is 59.0 mmHg. Left atrial size was severely dilated. Right atrial size was severely dilated. Mild mitral valve regurgitation. Mild mitral stenosis. The mean mitral valve gradient is 4.4 mmHg. Tricuspid valve regurgitation is moderate to severe. Hepatic vein systolic doppler flow reversal was seen, this is consistent with severe TR. Aortic valve regurgitation is mild. Moderate to severe paradoxical low flow/low gradient aortic valve stenosis (visually could be severe AS). Aortic valve area, by VTI measures 0.91 cm. Aortic valve mean gradient measures 16.0 mmHg. There is moderate dilatation of the ascending aorta, measuring 44 mm.     Neuro/Psych CVA  negative psych ROS   GI/Hepatic Neg liver ROS, hiatal hernia,GERD  Medicated and Controlled,,  Endo/Other  negative endocrine ROS    Renal/GU negative Renal ROS     Musculoskeletal  (+) Arthritis ,     Abdominal   Peds  Hematology  INR 1.3 On pradaxa    Anesthesia Other Findings   Reproductive/Obstetrics                              Anesthesia Physical Anesthesia Plan  ASA: 4  Anesthesia Plan: MAC   Post-op Pain Management: Tylenol PO (pre-op)*   Induction: Intravenous  PONV Risk Score and Plan: 2 and Treatment may vary due to age or medical condition and Propofol infusion  Airway Management Planned: Simple Face Mask and Natural Airway  Additional Equipment: Arterial line  Intra-op Plan:   Post-operative Plan: Possible Post-op intubation/ventilation  Informed Consent: I have reviewed the patients History and Physical, chart, labs and discussed the procedure including the risks, benefits and alternatives for the proposed anesthesia with the patient or authorized representative who has indicated his/her understanding and acceptance.   Patient has DNR.  Discussed DNR with patient and Discussed DNR with power of attorney.     Plan Discussed with: CRNA, Anesthesiologist and Surgeon  Anesthesia Plan Comments: (Lengthy discussion had in preop with patient and son. As per DNR, patient is accepting of intubation if necessary to get through the surgical procedure. However, she does not want chest compressions under any circumstance.)         Anesthesia Quick Evaluation

## 2023-02-14 NOTE — Anesthesia Procedure Notes (Addendum)
Arterial Line Insertion Start/End10/07/2022 10:30 AM, 02/25/2023 10:39 AM Performed by: Beryle Lathe, MD, anesthesiologist  Patient location: OR. Preanesthetic checklist: patient identified, IV checked, site marked, risks and benefits discussed, surgical consent, monitors and equipment checked, pre-op evaluation, timeout performed and anesthesia consent Lidocaine 1% used for infiltration and patient sedated Left, radial was placed Catheter size: 20 G Hand hygiene performed   Attempts: 2 (Previous attempt by CRNA unsuccessful) Procedure performed using ultrasound guided technique. Ultrasound Notes:anatomy identified, needle tip was noted to be adjacent to the nerve/plexus identified, no ultrasound evidence of intravascular and/or intraneural injection and image(s) printed for medical record Following insertion, dressing applied and Biopatch. Post procedure assessment: normal and unchanged  Post procedure complications: second provider assisted. Patient tolerated the procedure well with no immediate complications.

## 2023-02-14 NOTE — Anesthesia Postprocedure Evaluation (Signed)
Anesthesia Post Note  Patient: [name]  Procedure(s) Performed: ABDOMINAL AORTIC ENDOVASCULAR STENT GRAFT (Abdomen) ULTRASOUND GUIDANCE FOR VASCULAR ACCESS (Bilateral: Groin)     Patient location during evaluation: PACU Anesthesia Type: MAC Level of consciousness: awake and alert Pain management: pain level controlled Vital Signs Assessment: post-procedure vital signs reviewed and stable Respiratory status: spontaneous breathing, nonlabored ventilation and respiratory function stable Cardiovascular status: stable and blood pressure returned to baseline Anesthetic complications: no   No notable events documented.  Last Vitals:  Vitals:   2023-03-15 1401 2023/03/15 1500  BP: 113/67 117/74  Pulse:  70  Resp:  19  Temp:    SpO2:  97%    Last Pain:  Vitals:   March 15, 2023 1500  TempSrc: Oral  PainSc:                  Beryle Lathe

## 2023-02-14 NOTE — Plan of Care (Signed)
  Problem: Education: Goal: Knowledge of General Education information will improve Description: Including pain rating scale, medication(s)/side effects and non-pharmacologic comfort measures Outcome: Progressing   Problem: Health Behavior/Discharge Planning: Goal: Ability to manage health-related needs will improve Outcome: Progressing   Problem: Clinical Measurements: Goal: Ability to maintain clinical measurements within normal limits will improve Outcome: Progressing Goal: Will remain free from infection Outcome: Progressing   Problem: Nutrition: Goal: Adequate nutrition will be maintained Outcome: Not Progressing

## 2023-02-14 NOTE — Transfer of Care (Signed)
Immediate Anesthesia Transfer of Care Note  Patient: [name]  Procedure(s) Performed: ABDOMINAL AORTIC ENDOVASCULAR STENT GRAFT (Abdomen) ULTRASOUND GUIDANCE FOR VASCULAR ACCESS (Bilateral: Groin)  Patient Location: PACU  Anesthesia Type:MAC  Level of Consciousness: awake, alert , and sedated  Airway & Oxygen Therapy: Patient Spontanous Breathing and Patient connected to face mask oxygen  Post-op Assessment: Report given to RN and Post -op Vital signs reviewed and stable  Post vital signs: Reviewed and stable  Last Vitals:  Vitals Value Taken Time  BP 122/71 03/12/2023 1250  Temp 97.3   Pulse 70 03/13/2023 1254  Resp 15 02/13/2023 1254  SpO2 100 % 03/01/2023 1254  Vitals shown include unfiled device data.  Last Pain:  Vitals:   02/28/2023 0919  TempSrc: Oral  PainSc: 0-No pain      Patients Stated Pain Goal: 0 (02/13/23 1410)  Complications: No notable events documented.

## 2023-02-14 NOTE — Progress Notes (Signed)
OR today for EVAR-  We will continue to monitor patient advancement through interdisciplinary progression rounds. If new patient transition needs arise, please place a TOC consult.    02/20/2023 1445  TOC Brief Assessment  Insurance and Status Reviewed  Patient has primary care physician Yes  Home environment has been reviewed home  Prior level of function: self  Prior/Current Home Services No current home services  Social Determinants of Health Reivew SDOH reviewed no interventions necessary  Transition of care needs no transition of care needs at this time

## 2023-02-15 ENCOUNTER — Inpatient Hospital Stay (HOSPITAL_COMMUNITY): Payer: Medicare Other

## 2023-02-15 DIAGNOSIS — I714 Abdominal aortic aneurysm, without rupture, unspecified: Secondary | ICD-10-CM | POA: Diagnosis not present

## 2023-02-15 LAB — CBC WITH DIFFERENTIAL/PLATELET
Abs Immature Granulocytes: 0.11 10*3/uL — ABNORMAL HIGH (ref 0.00–0.07)
Basophils Absolute: 0 10*3/uL (ref 0.0–0.1)
Basophils Relative: 0 %
Eosinophils Absolute: 0 10*3/uL (ref 0.0–0.5)
Eosinophils Relative: 0 %
HCT: 42.9 % (ref 36.0–46.0)
Hemoglobin: 14.3 g/dL (ref 12.0–15.0)
Immature Granulocytes: 1 %
Lymphocytes Relative: 3 %
Lymphs Abs: 0.4 10*3/uL — ABNORMAL LOW (ref 0.7–4.0)
MCH: 31.7 pg (ref 26.0–34.0)
MCHC: 33.3 g/dL (ref 30.0–36.0)
MCV: 95.1 fL (ref 80.0–100.0)
Monocytes Absolute: 0.8 10*3/uL (ref 0.1–1.0)
Monocytes Relative: 5 %
Neutro Abs: 13.6 10*3/uL — ABNORMAL HIGH (ref 1.7–7.7)
Neutrophils Relative %: 91 %
Platelets: 99 10*3/uL — ABNORMAL LOW (ref 150–400)
RBC: 4.51 MIL/uL (ref 3.87–5.11)
RDW: 15 % (ref 11.5–15.5)
WBC: 14.9 10*3/uL — ABNORMAL HIGH (ref 4.0–10.5)
nRBC: 0 % (ref 0.0–0.2)

## 2023-02-15 LAB — BASIC METABOLIC PANEL
Anion gap: 16 — ABNORMAL HIGH (ref 5–15)
BUN: 21 mg/dL (ref 8–23)
CO2: 21 mmol/L — ABNORMAL LOW (ref 22–32)
Calcium: 8.1 mg/dL — ABNORMAL LOW (ref 8.9–10.3)
Chloride: 98 mmol/L (ref 98–111)
Creatinine, Ser: 0.9 mg/dL (ref 0.44–1.00)
GFR, Estimated: 59 mL/min — ABNORMAL LOW (ref >60)
Glucose, Bld: 138 mg/dL — ABNORMAL HIGH (ref 70–99)
Potassium: 4.4 mmol/L (ref 3.5–5.1)
Sodium: 135 mmol/L (ref 135–145)

## 2023-02-15 LAB — MAGNESIUM: Magnesium: 2.2 mg/dL (ref 1.7–2.4)

## 2023-02-15 MED ORDER — POLYETHYLENE GLYCOL 3350 17 G PO PACK
17.0000 g | PACK | Freq: Every day | ORAL | Status: DC
  Administered 2023-02-15 – 2023-02-17 (×3): 17 g via ORAL
  Filled 2023-02-15 (×3): qty 1

## 2023-02-15 MED ORDER — MAGNESIUM HYDROXIDE 400 MG/5ML PO SUSP
30.0000 mL | Freq: Every day | ORAL | Status: DC
  Administered 2023-02-16 – 2023-02-19 (×3): 30 mL via ORAL
  Filled 2023-02-15 (×5): qty 30

## 2023-02-15 MED ORDER — DABIGATRAN ETEXILATE MESYLATE 75 MG PO CAPS
75.0000 mg | ORAL_CAPSULE | Freq: Two times a day (BID) | ORAL | Status: DC
  Administered 2023-02-15 – 2023-02-19 (×9): 75 mg via ORAL
  Filled 2023-02-15 (×11): qty 1

## 2023-02-15 NOTE — Significant Event (Signed)
Rapid Response Event Note   Reason for Call :  Second set of eyes, abdominal pain, left groin pain  Initial Focused Assessment:  Patient lying in bed, complaints of left incisional groin pain (noted to have bilateral groin incisions). No hematoma/bruising noted. Abdomen distended, soft on palpation, positive bowel sounds; denies passing gas, unsure of LBM. Foley removed earlier today, bladder scanned at this time, 0ml. Skin warm and dry. Lungs clear, heart tones normal.   146/78 (98) HR 71 RR 19 O2 94% RA  Interventions:  Bladder scan: 0ml Recently received pain medication PTA, see MAR PA called PTA MD Hawkens notified: KUB, miralax, milk of mag ordered  Plan of Care:  Call back for further needs.  Event Summary:  MD Notified: T. Hawkens MD  Call Time: 1125 Arrival Time: 1129 End Time: 1145  Truddie Crumble, RN

## 2023-02-15 NOTE — Progress Notes (Signed)
PROGRESS NOTE    [name]  RUE:[id number] DOB: 1925/11/02 DOA: 02/20/2023 PCP: [name], MD   Brief Narrative:  89 y.o. female with medical history significant of abdominal aortic aneurysm (previously followed with Dr. Florene Route surgery: In 2022, aneurysm size was 6.2, patient had declined surgical workup and was no longer following up with vascular surgery), persistent A-fib on Pradaxa, complete heart block status post pacemaker, moderate CAD without obstructive lesions, COPD, aortic insufficiency, type a dissection of descending thoracic aorta, hypertension, hyperlipidemia presented with worsening abdominal pain. She had presented to the ED with abdominal and back pain few days ago. CT showed increased size of abdominal aortic aneurysm up to 7.8 cm without rupture. Patient declined any intervention. She also had evidence of UTI and was discharged on oral Keflex and pain meds. Patient subsequently has changed her mind and wanted to speak to vascular surgery for possible surgical intervention.  On presentation, vascular surgery was consulted.  She underwent EVAR by vascular surgery on 03/04/2023.  Assessment & Plan:   Enlarging AAA presenting with worsening abdominal pain -previously followed with Dr. Florene Route surgery: In 2022, aneurysm size was 6.2, patient had declined surgical workup and was no longer following up with vascular surgery -Recent CT had shown increased size of abdominal aortic aneurysm up to 7.8 cm without rupture.  Patient declined any intervention. - Patient subsequently changed her mind and now wants surgical intervention. -Repeat CT angio on presentation showed increased size of infrarenal abdominal arctic aneurysm measuring 7.8 x 7.8 cm.   -underwent EVAR by vascular surgery on 02/26/2023.  Follow further recommendations from vascular surgery.  Chronic diastolic heart failure -Compensated.  Strict input and output.  Daily weights.  Fluid restriction.   Continue Lasix, Imdur and metoprolol. -Echo showed EF of 60 to 65% with moderate to severe aortic valve stenosis.  Doubt that she will be a candidate for any surgical intervention.  Follow further palliative care discussions.  Leukocytosis -Monitor   Thrombocytopenia -Questionable cause.  Platelets 99 today.  Monitor.  Hyponatremia -Resolved  Hypertension -Monitor blood pressure.  Continue Cardizem, Imdur, Lasix and metoprolol.   Persistent A-fib -Pradaxa on hold.  Continue Cardizem and monitor well.  Outpatient follow-up with cardiology   History of complete heart block status post pacemaker -Outpatient follow-up with cardiology   Moderate CAD without obstructing lesions -Stable.  Outpatient follow-up with cardiology   Hyperlipidemia -Continue statin   Recent diagnosis of UTI -Had E. coli growing in recent urine cultures from 02/09/2023.  Continue Keflex to finish course.   Goals of care -Palliative care following.     DVT prophylaxis: SCDs Code Status: DNR Family Communication: Son at bedside on 02/13/2023 Disposition Plan: Status is: Inpatient Remains inpatient appropriate because: Of severity of illness  Consultants: Vascular surgery/palliative care  Procedures: As above Antimicrobials: Keflex  Subjective: Patient seen and examined at bedside.  Poor historian.  Does not feel well, complains of some abdominal pain.  No fever, vomiting or agitation reported.  Objective: Vitals:   02/15/23 0733 02/15/23 0800 02/15/23 0900 02/15/23 0947  BP: (!) 147/78 (!) 166/93 131/73 (!) 142/79  Pulse: 70 70 70 70  Resp: 19 20 19 20   Temp: 97.6 F (36.4 C)     TempSrc: Oral     SpO2: 92% 100% 98% 98%  Weight:      Height:        Intake/Output Summary (Last 24 hours) at 02/15/2023 1021 Last data filed at 02/15/2023 0930 Gross per 24 hour  Intake  715 ml  Output 1030 ml  Net -315 ml   Filed Weights   03/06/2023 1215 02/18/2023 0500  Weight: 53.1 kg 54.2 kg     Examination:  General: No distress.  On room air currently.  Elderly female lying in bed. ENT/neck: No obvious neck masses or JVD elevation noted  respiratory: Bilateral decreased breath sounds at bases with some scattered crackles  CVS: Rate mostly controlled; S1 and S2 heard Abdominal: Soft, mildly tender and distended; no organomegaly, bowel sounds are heard  extremities: No cyanosis; trace lower extremity edema present CNS: Still very slow to respond and a poor historian.  No obvious focal neurologic deficits noted.   Lymph: No palpable lymphadenopathy noted  skin: No obvious rashes/petechiae psych: Showing no signs of agitation.  Flat affect currently.. musculoskeletal: No obvious joint erythema/tenderness   Data Reviewed: I have personally reviewed following labs and imaging studies  CBC: Recent Labs  Lab 02/09/23 0051 02/09/23 0101 03/06/23 1241 02/13/23 0449 02/20/2023 0415 03/09/2023 1812 02/15/23 0354  WBC 18.7*  --  13.6* 12.6* 14.4* 13.2* 14.9*  NEUTROABS 15.7*  --   --  10.4* 11.5*  --  13.6*  HGB 15.6*   < > 14.6 14.4 15.3* 13.5 14.3  HCT 47.8*   < > 45.9 44.2 45.9 41.1 42.9  MCV 93.5  --  96.2 93.1 95.0 95.6 95.1  PLT 170  --  145* 139* 155 122* 99*   < > = values in this interval not displayed.   Basic Metabolic Panel: Recent Labs  Lab 03/06/23 1241 02/13/23 0449 03/08/2023 0415 03/10/2023 1812 02/15/23 0354  NA 134* 136 134* 135 135  K 3.7 3.9 4.0 4.0 4.4  CL 95* 98 94* 99 98  CO2 26 27 26 24  21*  GLUCOSE 117* 107* 107* 163* 138*  BUN 22 19 20 19 21   CREATININE 0.92 0.88 1.00 0.93 0.90  CALCIUM 8.5* 8.6* 8.6* 7.9* 8.1*  MG  --  2.4 2.5* 2.2 2.2   GFR: Estimated Creatinine Clearance: 29.6 mL/min (by C-G formula based on SCr of 0.9 mg/dL). Liver Function Tests: Recent Labs  Lab 02/09/23 0051 2023/03/06 1241 02/13/23 0449  AST 52* 36 28  ALT 59* 43 34  ALKPHOS 142* 137* 125  BILITOT 1.0 1.5* 1.9*  PROT 6.6 5.7* 5.6*  ALBUMIN 3.7 3.2* 3.0*    Recent Labs  Lab 02/09/23 0051  LIPASE 30   No results for input(s): "AMMONIA" in the last 168 hours. Coagulation Profile: Recent Labs  Lab 02/13/23 0449 03/04/2023 1812  INR 1.3* 1.4*   Cardiac Enzymes: No results for input(s): "CKTOTAL", "CKMB", "CKMBINDEX", "TROPONINI" in the last 168 hours. BNP (last 3 results) No results for input(s): "PROBNP" in the last 8760 hours. HbA1C: No results for input(s): "HGBA1C" in the last 72 hours. CBG: No results for input(s): "GLUCAP" in the last 168 hours. Lipid Profile: No results for input(s): "CHOL", "HDL", "LDLCALC", "TRIG", "CHOLHDL", "LDLDIRECT" in the last 72 hours. Thyroid Function Tests: No results for input(s): "TSH", "T4TOTAL", "FREET4", "T3FREE", "THYROIDAB" in the last 72 hours. Anemia Panel: No results for input(s): "VITAMINB12", "FOLATE", "FERRITIN", "TIBC", "IRON", "RETICCTPCT" in the last 72 hours. Sepsis Labs: No results for input(s): "PROCALCITON", "LATICACIDVEN" in the last 168 hours.  Recent Results (from the past 240 hour(s))  Urine Culture     Status: Abnormal   Collection Time: 02/09/23 12:41 AM   Specimen: Urine, Random  Result Value Ref Range Status   Specimen Description URINE, RANDOM  Final  Special Requests   Final    URINE, CLEAN CATCH Performed at [facility], [address], [place], [place] [address]    Culture >=100,000 COLONIES/mL ESCHERICHIA COLI (A)  Final   Report Status 02/11/2023 FINAL  Final   Organism ID, Bacteria ESCHERICHIA COLI (A)  Final      Susceptibility   Escherichia coli - MIC*    AMPICILLIN <=2 SENSITIVE Sensitive     CEFAZOLIN <=4 SENSITIVE Sensitive     CEFEPIME <=0.12 SENSITIVE Sensitive     CEFTRIAXONE <=0.25 SENSITIVE Sensitive     CIPROFLOXACIN <=0.25 SENSITIVE Sensitive     GENTAMICIN <=1 SENSITIVE Sensitive     IMIPENEM <=0.25 SENSITIVE Sensitive     NITROFURANTOIN <=16 SENSITIVE Sensitive     TRIMETH/SULFA <=20 SENSITIVE Sensitive     AMPICILLIN/SULBACTAM  <=2 SENSITIVE Sensitive     PIP/TAZO <=4 SENSITIVE Sensitive     * >=100,000 COLONIES/mL ESCHERICHIA COLI  Surgical pcr screen     Status: None   Collection Time: 03/02/2023  7:26 AM   Specimen: Nasal Mucosa; Nasal Swab  Result Value Ref Range Status   MRSA, PCR NEGATIVE NEGATIVE Final   Staphylococcus aureus NEGATIVE NEGATIVE Final    Comment: (NOTE) The Xpert SA Assay (FDA approved for NASAL specimens in patients 12 years of age and older), is one component of a comprehensive surveillance program. It is not intended to diagnose infection nor to guide or monitor treatment. Performed at [facility], [address], [place], [place] [address]          Radiology Studies: PERIPHERAL VASCULAR CATHETERIZATION  Result Date: 02/20/2023 See surgical note for result.  HYBRID OR IMAGING ([facility] ONLY)  Result Date: 02/27/2023 There is no interpretation for this exam.  This order is for images obtained during a surgical procedure.  Please See "Surgeries" Tab for more information regarding the procedure.        Scheduled Meds:  cephALEXin  500 mg Oral BID   diltiazem  120 mg Oral QHS   docusate sodium  100 mg Oral Daily   furosemide  40 mg Oral Daily   heparin  5,000 Units Subcutaneous Q8H   isosorbide mononitrate  30 mg Oral Daily   metoprolol succinate  25 mg Oral Daily   rosuvastatin  5 mg Oral QHS   Continuous Infusions:  sodium chloride     sodium chloride 75 mL/hr at 02/26/2023 1400   magnesium sulfate bolus IVPB            Glade Lloyd, MD [facility] 02/15/2023, 10:21 AM

## 2023-02-15 NOTE — Plan of Care (Signed)
  Problem: Education: Goal: Knowledge of General Education information will improve Description: Including pain rating scale, medication(s)/side effects and non-pharmacologic comfort measures Outcome: Progressing   Problem: Clinical Measurements: Goal: Ability to maintain clinical measurements within normal limits will improve Outcome: Progressing Goal: Diagnostic test results will improve Outcome: Progressing   

## 2023-02-15 NOTE — Progress Notes (Addendum)
Progress Note    02/15/2023 8:22 AM 1 Day Post-Op  Subjective: Endorses some lower abdomen soreness, controlled with pain meds.  States her abdominal pain is greatly improved since surgery    Vitals:   02/15/23 0300 02/15/23 0733  BP: (!) 149/81 (!) 147/78  Pulse: 70 70  Resp: 20 19  Temp:  97.6 F (36.4 C)  SpO2: 95% 92%    Physical Exam: General: Sitting up in bed eating breakfast Cardiac: Regular Lungs: Nonlabored Incisions: Bilateral groin access sites soft without hematoma Extremities: Brisk DP Doppler signals bilaterally Abdomen: Soft, nontender, nondistended  CBC    Component Value Date/Time   WBC 14.9 (H) 02/15/2023 0354   RBC 4.51 02/15/2023 0354   HGB 14.3 02/15/2023 0354   HGB 15.4 04/09/2021 1221   HCT 42.9 02/15/2023 0354   HCT 47.1 (H) 04/09/2021 1221   PLT 99 (L) 02/15/2023 0354   PLT 134 (L) 04/09/2021 1221   MCV 95.1 02/15/2023 0354   MCV 96 04/09/2021 1221   MCH 31.7 02/15/2023 0354   MCHC 33.3 02/15/2023 0354   RDW 15.0 02/15/2023 0354   RDW 12.2 04/09/2021 1221   LYMPHSABS 0.4 (L) 02/15/2023 0354   MONOABS 0.8 02/15/2023 0354   EOSABS 0.0 02/15/2023 0354   BASOSABS 0.0 02/15/2023 0354    BMET    Component Value Date/Time   NA 135 02/15/2023 0354   NA 145 (H) 03/21/2022 1253   K 4.4 02/15/2023 0354   CL 98 02/15/2023 0354   CO2 21 (L) 02/15/2023 0354   GLUCOSE 138 (H) 02/15/2023 0354   BUN 21 02/15/2023 0354   BUN 17 03/21/2022 1253   CREATININE 0.90 02/15/2023 0354   CREATININE 0.98 (H) 08/28/2016 1343   CALCIUM 8.1 (L) 02/15/2023 0354   GFRNONAA 59 (L) 02/15/2023 0354   GFRAA 59 (L) 01/11/2019 2041    INR    Component Value Date/Time   INR 1.4 (H) 2023/03/04 1812     Intake/Output Summary (Last 24 hours) at 02/15/2023 2993 Last data filed at 02/15/2023 0600 Gross per 24 hour  Intake 715 ml  Output 930 ml  Net -215 ml      Assessment/Plan:  89 y.o. female is 1 day postop, s/p: EVAR   -The patient's abdominal  pain is greatly improved since endovascular aneurysm repair yesterday.  -On exam her abdomen is soft, nontender, nondistended.  Her bilateral groin access sites are soft without hematoma -Bilateral lower extremities are warm and well-perfused with brisk DP Doppler signals -She is hemodynamically stable with postop hemoglobin of 14.3. -Scr stable at 0.9 -She is stable for discharge from a vascular perspective.  We will arrange follow-up with our office in 4 weeks with CTA abdomen/pelvis   Loel Dubonnet, PA-C [facility] [phone] 02/15/2023 8:22 AM  VASCULAR STAFF ADDENDUM: I have independently interviewed and examined the patient. I agree with the above.  Looks great postoperative day 1 after endovascular aortic aneurysm repair for symptomatic aneurysm. Abdomen is soft, nontender, nondistended Groins are soft without evidence of hematoma Doppler flow and dorsalis pedis arteries bilaterally. Safe for discharge, once she has tolerated diet and has been up ambulating.  She may need an extra day because of deconditioning.  Rande Brunt. Lenell Antu, MD [facility] Phone Number: [phone] 02/15/2023 11:25 AM

## 2023-02-15 NOTE — Evaluation (Signed)
Physical Therapy Evaluation Patient Details Name: [name] MRN: [id number] DOB: 29-Apr-1926 Today's Date: 02/15/2023  History of Present Illness  89 yo female presents to [facility] on 10/2 with worsening abdominal pain due to known AAA. S/p abdominal aortogram with iliac arteriogram including catheter selection of aorta, aortobiliac stent graft for repair of AAA via bilat femoral access on 10/4. PMH includes GERD, known AAA, CAD, COPD, CVA, cancer, afib, HTN, pacemaker, cardiac cath.  Clinical Impression   Pt presents with generalized deconditioning, bilat groin discomfort, impaired balance, and decreased activity tolerance. Pt to benefit from acute PT to address deficits. Pt ambulated short hallway distance with use of RW, overall requiring light PT assist for mobility at this time. HR 70s throughout session, SpO2 96% on RA during gait . PT spoke with pt's son Trey Paula via telephone, plan is for pt to d/c home with assist from sons at night and assist from aides 8a-8p daily. PT also recommending HHPT. PT to progress mobility as tolerated, and will continue to follow acutely.      Groin sites [facility], no bruising or swelling noted post-gait    If plan is discharge home, recommend the following: A little help with walking and/or transfers;A little help with bathing/dressing/bathroom   Can travel by private vehicle        Equipment Recommendations None recommended by PT  Recommendations for Other Services       Functional Status Assessment Patient has had a recent decline in their functional status and demonstrates the ability to make significant improvements in function in a reasonable and predictable amount of time.     Precautions / Restrictions Precautions Precautions: Fall Restrictions Weight Bearing Restrictions: No      Mobility  Bed Mobility Overal bed mobility: Needs Assistance Bed Mobility: Supine to Sit, Sit to Supine     Supine to sit: Supervision, Used rails, HOB elevated Sit  to supine: Supervision, Used rails, HOB elevated   General bed mobility comments: increased time and use of bedrails    Transfers Overall transfer level: Needs assistance Equipment used: Rolling walker (2 wheels) Transfers: Sit to/from Stand Sit to Stand: Min assist           General transfer comment: light rise and steady assist, stand x2 from eOB and BSC.    Ambulation/Gait Ambulation/Gait assistance: Contact guard assist Gait Distance (Feet): 90 Feet Assistive device: Rolling walker (2 wheels) Gait Pattern/deviations: Step-through pattern, Decreased stride length, Trunk flexed Gait velocity: decr     General Gait Details: antalgic, increased time, close gaurd for safety and cues for proximity of RW  Stairs            Wheelchair Mobility     Tilt Bed    Modified Rankin (Stroke Patients Only)       Balance Overall balance assessment: Needs assistance Sitting-balance support: No upper extremity supported, Feet supported Sitting balance-Leahy Scale: Fair     Standing balance support: Bilateral upper extremity supported, During functional activity, Reliant on assistive device for balance Standing balance-Leahy Scale: Poor                               Pertinent Vitals/Pain Pain Assessment Pain Assessment: Faces Faces Pain Scale: Hurts little more Pain Location: bilat groin sites Pain Descriptors / Indicators: Discomfort, Grimacing Pain Intervention(s): Limited activity within patient's tolerance, Monitored during session, Repositioned    Home Living Family/patient expects to be discharged to:: Private residence Living  Arrangements: Alone Available Help at Discharge: Personal care attendant;Family (HH aide 8a-8p) Type of Home: House Home Access: Ramped entrance       Home Layout: Two level Home Equipment: Agricultural consultant (2 wheels);Rollator (4 wheels);BSC/3in1;Tub bench      Prior Function Prior Level of Function : Needs assist              Mobility Comments: uses RW for mobility ADLs Comments: aides help pt get dressed and bathe herself     Extremity/Trunk Assessment   Upper Extremity Assessment Upper Extremity Assessment: Defer to OT evaluation    Lower Extremity Assessment Lower Extremity Assessment: Generalized weakness (1/2 expected AROM hip flexion secondary to groin incisions)    Cervical / Trunk Assessment Cervical / Trunk Assessment: Kyphotic  Communication   Communication Communication: Hearing impairment Cueing Techniques: Verbal cues;Gestural cues  Cognition Arousal: Alert Behavior During Therapy: WFL for tasks assessed/performed Overall Cognitive Status: No family/caregiver present to determine baseline cognitive functioning                                 General Comments: pt states "my memory left me", unsure of baseline cognition. Appropriate and pleasant during session, can be impulsive        General Comments General comments (skin integrity, edema, etc.): HR 70s throughout session, SpO2 96% on RA during gait    Exercises     Assessment/Plan    PT Assessment Patient needs continued PT services  PT Problem List Decreased strength;Decreased mobility;Decreased activity tolerance;Decreased balance;Decreased knowledge of use of DME;Pain;Cardiopulmonary status limiting activity;Decreased range of motion       PT Treatment Interventions DME instruction;Therapeutic activities;Gait training;Therapeutic exercise;Patient/family education;Balance training;Stair training;Functional mobility training;Neuromuscular re-education    PT Goals (Current goals can be found in the Care Plan section)  Acute Rehab PT Goals Patient Stated Goal: home PT Goal Formulation: With patient Time For Goal Achievement: 03/01/23 Potential to Achieve Goals: Good    Frequency Min 1X/week     Co-evaluation               AM-PAC PT "6 Clicks" Mobility  Outcome Measure Help needed  turning from your back to your side while in a flat bed without using bedrails?: A Little Help needed moving from lying on your back to sitting on the side of a flat bed without using bedrails?: A Little Help needed moving to and from a bed to a chair (including a wheelchair)?: A Little Help needed standing up from a chair using your arms (e.g., wheelchair or bedside chair)?: A Little Help needed to walk in hospital room?: A Little Help needed climbing 3-5 steps with a railing? : A Lot 6 Click Score: 17    End of Session Equipment Utilized During Treatment: Gait belt Activity Tolerance: Patient tolerated treatment well Patient left: in bed;with call bell/phone within reach;with bed alarm set;with SCD's reapplied Nurse Communication: Mobility status PT Visit Diagnosis: Other abnormalities of gait and mobility (R26.89);Muscle weakness (generalized) (M62.81)    Time: 8841-6606 PT Time Calculation (min) (ACUTE ONLY): 26 min   Charges:   PT Evaluation $PT Eval Low Complexity: 1 Low PT Treatments $Therapeutic Activity: 8-22 mins PT General Charges $$ ACUTE PT VISIT: 1 Visit         Marye Round, PT DPT Acute Rehabilitation Services Secure Chat Preferred  Office [phone]   Caitrin Pendergraph Sheliah Plane 02/15/2023, 4:48 PM

## 2023-02-16 DIAGNOSIS — I714 Abdominal aortic aneurysm, without rupture, unspecified: Secondary | ICD-10-CM | POA: Diagnosis not present

## 2023-02-16 LAB — BASIC METABOLIC PANEL
Anion gap: 13 (ref 5–15)
BUN: 20 mg/dL (ref 8–23)
CO2: 23 mmol/L (ref 22–32)
Calcium: 8.6 mg/dL — ABNORMAL LOW (ref 8.9–10.3)
Chloride: 96 mmol/L — ABNORMAL LOW (ref 98–111)
Creatinine, Ser: 0.85 mg/dL (ref 0.44–1.00)
GFR, Estimated: >60 mL/min (ref >60)
Glucose, Bld: 129 mg/dL — ABNORMAL HIGH (ref 70–99)
Potassium: 4.2 mmol/L (ref 3.5–5.1)
Sodium: 132 mmol/L — ABNORMAL LOW (ref 135–145)

## 2023-02-16 LAB — CBC WITH DIFFERENTIAL/PLATELET
Abs Immature Granulocytes: 0.27 10*3/uL — ABNORMAL HIGH (ref 0.00–0.07)
Basophils Absolute: 0 10*3/uL (ref 0.0–0.1)
Basophils Relative: 0 %
Eosinophils Absolute: 0 10*3/uL (ref 0.0–0.5)
Eosinophils Relative: 0 %
HCT: 42.5 % (ref 36.0–46.0)
Hemoglobin: 14.1 g/dL (ref 12.0–15.0)
Immature Granulocytes: 1 %
Lymphocytes Relative: 3 %
Lymphs Abs: 0.6 10*3/uL — ABNORMAL LOW (ref 0.7–4.0)
MCH: 30.7 pg (ref 26.0–34.0)
MCHC: 33.2 g/dL (ref 30.0–36.0)
MCV: 92.6 fL (ref 80.0–100.0)
Monocytes Absolute: 1.9 10*3/uL — ABNORMAL HIGH (ref 0.1–1.0)
Monocytes Relative: 8 %
Neutro Abs: 19.9 10*3/uL — ABNORMAL HIGH (ref 1.7–7.7)
Neutrophils Relative %: 88 %
Platelets: 141 10*3/uL — ABNORMAL LOW (ref 150–400)
RBC: 4.59 MIL/uL (ref 3.87–5.11)
RDW: 15.2 % (ref 11.5–15.5)
WBC: 22.7 10*3/uL — ABNORMAL HIGH (ref 4.0–10.5)
nRBC: 0 % (ref 0.0–0.2)

## 2023-02-16 LAB — MAGNESIUM: Magnesium: 2.3 mg/dL (ref 1.7–2.4)

## 2023-02-16 MED ORDER — GABAPENTIN 300 MG PO CAPS
300.0000 mg | ORAL_CAPSULE | Freq: Once | ORAL | Status: AC
  Administered 2023-02-16: 300 mg via ORAL
  Filled 2023-02-16: qty 1

## 2023-02-16 MED ORDER — HYDROCORTISONE 1 % EX CREA
TOPICAL_CREAM | Freq: Three times a day (TID) | CUTANEOUS | Status: DC | PRN
  Administered 2023-02-18: 1 via TOPICAL
  Filled 2023-02-16: qty 28

## 2023-02-16 MED ORDER — ACETAMINOPHEN 650 MG RE SUPP: 650.0000 mg | Freq: Four times a day (QID) | RECTAL | Status: DC | PRN

## 2023-02-16 MED ORDER — OXYCODONE HCL 5 MG PO TABS
5.0000 mg | ORAL_TABLET | ORAL | Status: DC | PRN
  Administered 2023-02-16 – 2023-02-17 (×4): 5 mg via ORAL
  Filled 2023-02-16 (×4): qty 1

## 2023-02-16 MED ORDER — ACYCLOVIR 400 MG PO TABS
800.0000 mg | ORAL_TABLET | Freq: Every day | ORAL | Status: DC
  Administered 2023-02-16: 800 mg via ORAL
  Filled 2023-02-16 (×3): qty 2

## 2023-02-16 MED ORDER — ACETAMINOPHEN 500 MG PO TABS
1000.0000 mg | ORAL_TABLET | Freq: Four times a day (QID) | ORAL | Status: DC | PRN
  Administered 2023-02-16: 1000 mg via ORAL
  Filled 2023-02-16: qty 2

## 2023-02-16 MED ORDER — GABAPENTIN 300 MG PO CAPS: 300.0000 mg | ORAL_CAPSULE | Freq: Two times a day (BID) | ORAL | Status: DC

## 2023-02-16 MED ORDER — VALACYCLOVIR HCL 500 MG PO TABS
1000.0000 mg | ORAL_TABLET | Freq: Two times a day (BID) | ORAL | Status: DC
  Administered 2023-02-16 – 2023-02-19 (×7): 1000 mg via ORAL
  Filled 2023-02-16 (×8): qty 2

## 2023-02-16 NOTE — Progress Notes (Signed)
TRH night cross cover note:   I was notified by RN of a painful blistering rash over the right portion of the patient's neck, which appears to be new within the last day.   I evaluated the patient at bedside, and per my exam, appreciated a painful vesicular rash over an erythematous base in what appears to be dermatomal distribution along the right neck, without associated crusting involving these lesions.  No extension of these lesions on the face or into the periorbital area.  Given the painful vesicular nature of this rash over an erythematous base in dermatomal distribution, suspect that this is a herpetic rash that appears consistent with shingles. In the setting of the timing of onset of this rash within the last day, there appears to be an indication for initiation of acyclovir.   I subsequently ordered acyclovir 800 mg p.o. 5 times per day.  Additionally, I started gabapentin 300 mg p.o. x 1 dose now followed by gabapentin 300 mg p.o. twice daily starting on 02/17/2023.  I also changed her existing Percocet 5/325 mg p.o. every 4 hours prn to oxycodone 5 p.o. every 4 hours prn to allow for increased anti-inflammatory dosing of her acetaminophen, which I updated to 1 g p.o. every 6 hours as needed.    Newton Pigg, DO Hospitalist

## 2023-02-16 NOTE — Progress Notes (Signed)
   02/16/23 1503  Assess: MEWS Score  Temp (!) 97.4 F (36.3 C)  BP 133/71  MAP (mmHg) 88  Pulse Rate 70  ECG Heart Rate 70  Resp (!) 28  SpO2 95 %  O2 Device Room Air  Assess: MEWS Score  MEWS Temp 0  MEWS Systolic 0  MEWS Pulse 0  MEWS RR 2  MEWS LOC 0  MEWS Score 2  MEWS Score Color Yellow  Assess: if the MEWS score is Yellow or Red  Were vital signs accurate and taken at a resting state? Yes  Does the patient meet 2 or more of the SIRS criteria? No  MEWS guidelines implemented  Yes, yellow  Treat  MEWS Interventions Considered administering scheduled or prn medications/treatments as ordered  Take Vital Signs  Increase Vital Sign Frequency  Yellow: Q2hr x1, continue Q4hrs until patient remains green for 12hrs  Escalate  MEWS: Escalate Yellow: Discuss with charge nurse and consider notifying provider and/or RRT  Notify: Charge Nurse/RN  Name of Charge Nurse/RN Notified [email or web address]  Assess: SIRS CRITERIA  SIRS Temperature  0  SIRS Pulse 0  SIRS Respirations  1  SIRS WBC 1  SIRS Score Sum  2

## 2023-02-16 NOTE — Progress Notes (Signed)
PROGRESS NOTE    [name]  RUE:[id number] DOB: [birth year] DOA: 03/08/2023 PCP: [name], MD   Brief Narrative:  89 y.o. female with medical history significant of abdominal aortic aneurysm (previously followed with Dr. Florene Route surgery: In 2022, aneurysm size was 6.2, patient had declined surgical workup and was no longer following up with vascular surgery), persistent A-fib on Pradaxa, complete heart block status post pacemaker, moderate CAD without obstructive lesions, COPD, aortic insufficiency, type a dissection of descending thoracic aorta, hypertension, hyperlipidemia presented with worsening abdominal pain. She had presented to the ED with abdominal and back pain few days ago. CT showed increased size of abdominal aortic aneurysm up to 7.8 cm without rupture. Patient declined any intervention. She also had evidence of UTI and was discharged on oral Keflex and pain meds. Patient subsequently has changed her mind and wanted to speak to vascular surgery for possible surgical intervention.  On presentation, vascular surgery was consulted.  She underwent EVAR by vascular surgery on 03/02/2023.  Assessment & Plan:   Enlarging AAA presenting with worsening abdominal pain -previously followed with Dr. Florene Route surgery: In 2022, aneurysm size was 6.2, patient had declined surgical workup and was no longer following up with vascular surgery -Recent CT had shown increased size of abdominal aortic aneurysm up to 7.8 cm without rupture.  Patient declined any intervention. - Patient subsequently changed her mind and now wants surgical intervention. -Repeat CT angio on presentation showed increased size of infrarenal abdominal arctic aneurysm measuring 7.8 x 7.8 cm.   -underwent EVAR by vascular surgery on 02/16/2023.  Vascular surgery has cleared the patient for discharge with outpatient follow-up.  Chronic diastolic heart failure -Compensated.  Strict input and output.  Daily  weights.  Fluid restriction.  Continue Lasix, Imdur and metoprolol. -Echo showed EF of 60 to 65% with moderate to severe aortic valve stenosis.  Doubt that she will be a candidate for any surgical intervention.  Follow further palliative care discussions.  Possible shingles rash around the right neck -Patient developed painful blistering rash over the right neck within the last day and has been started on Valtrex.  Continue Valtrex for a week.  Continue isolation.  Continue pain management.  Leukocytosis -WBCs have worsened today.  Monitor.  Thrombocytopenia -Questionable cause.  Platelets improving to 141 today.  Monitor.  Hyponatremia -Mild.  Monitor.  Hypertension -Monitor blood pressure.  Continue Cardizem, Imdur, Lasix and metoprolol.   Persistent A-fib -Pradaxa 02/15/2023 onwards continue Cardizem and monitor well.  Outpatient follow-up with cardiology   History of complete heart block status post pacemaker -Outpatient follow-up with cardiology   Moderate CAD without obstructing lesions -Stable.  Outpatient follow-up with cardiology   Hyperlipidemia -Continue statin   Recent diagnosis of UTI -Had E. coli growing in recent urine cultures from 02/09/2023.  Has been on Keflex.  DC Keflex as she has completed course.    Goals of care -Palliative care following.     DVT prophylaxis: SCDs Code Status: DNR Family Communication: Son at bedside on 02/13/2023 Disposition Plan: Status is: Inpatient Remains inpatient appropriate because: Of severity of illness  Consultants: Vascular surgery/palliative care  Procedures: As above Antimicrobials: Keflex  Subjective: Patient seen and examined at bedside.  Poor historian.  No agitation, seizures, vomiting or fever reported. Objective: Vitals:   02/15/23 1500 02/15/23 1930 02/15/23 2300 02/16/23 0400  BP: 138/80 (!) 147/85 (!) 151/92 (!) 115/93  Pulse: 70 70 70 72  Resp: 20 20 20 16   Temp: 98.1 F (36.7 C)  97.7 F (36.5 C)  (!) 97.5 F (36.4 C) 98.1 F (36.7 C)  TempSrc: Oral Oral Oral Oral  SpO2: 94% 96% 94% 94%  Weight:      Height:        Intake/Output Summary (Last 24 hours) at 02/16/2023 1037 Last data filed at 02/15/2023 1600 Gross per 24 hour  Intake --  Output 300 ml  Net -300 ml   Filed Weights   Feb 24, 2023 1215 02/20/2023 0500  Weight: 53.1 kg 54.2 kg    Examination:  General: On room air.  No acute distress.  Elderly female lying in bed. ENT/neck: No palpable thyromegaly or JVD elevation noted  respiratory: Decreased breath sounds at bases bilaterally with some crackles  CVS: S1-S2 heard; rate currently controlled  abdominal: Soft, still slightly distended and tender; no organomegaly, bowel sounds normally heard  extremities: Mild lower extremity edema present; no clubbing  CNS: Extremely poor historian; very slow to respond.  Wakes up slightly.  No obvious focal neurologic deficits noted.   Lymph: No obvious lymphadenopathy noted  skin: Visible rashes noted however right side of the neck psych: Extremely flat affect.  Not agitated currently.   Musculoskeletal: No obvious joint swelling/tenderness  Data Reviewed: I have personally reviewed following labs and imaging studies  CBC: Recent Labs  Lab 02/13/23 0449 03/09/2023 0415 02/18/2023 1812 02/15/23 0354 02/16/23 0340  WBC 12.6* 14.4* 13.2* 14.9* 22.7*  NEUTROABS 10.4* 11.5*  --  13.6* 19.9*  HGB 14.4 15.3* 13.5 14.3 14.1  HCT 44.2 45.9 41.1 42.9 42.5  MCV 93.1 95.0 95.6 95.1 92.6  PLT 139* 155 122* 99* 141*   Basic Metabolic Panel: Recent Labs  Lab 02/13/23 0449 02/24/2023 0415 02/28/2023 1812 02/15/23 0354 02/16/23 0340  NA 136 134* 135 135 132*  K 3.9 4.0 4.0 4.4 4.2  CL 98 94* 99 98 96*  CO2 27 26 24  21* 23  GLUCOSE 107* 107* 163* 138* 129*  BUN 19 20 19 21 20   CREATININE 0.88 1.00 0.93 0.90 0.85  CALCIUM 8.6* 8.6* 7.9* 8.1* 8.6*  MG 2.4 2.5* 2.2 2.2 2.3   GFR: Estimated Creatinine Clearance: 31.3 mL/min (by C-G  formula based on SCr of 0.85 mg/dL). Liver Function Tests: Recent Labs  Lab 02/24/2023 1241 02/13/23 0449  AST 36 28  ALT 43 34  ALKPHOS 137* 125  BILITOT 1.5* 1.9*  PROT 5.7* 5.6*  ALBUMIN 3.2* 3.0*   No results for input(s): "LIPASE", "AMYLASE" in the last 168 hours.  No results for input(s): "AMMONIA" in the last 168 hours. Coagulation Profile: Recent Labs  Lab 02/13/23 0449 02/20/2023 1812  INR 1.3* 1.4*   Cardiac Enzymes: No results for input(s): "CKTOTAL", "CKMB", "CKMBINDEX", "TROPONINI" in the last 168 hours. BNP (last 3 results) No results for input(s): "PROBNP" in the last 8760 hours. HbA1C: No results for input(s): "HGBA1C" in the last 72 hours. CBG: No results for input(s): "GLUCAP" in the last 168 hours. Lipid Profile: No results for input(s): "CHOL", "HDL", "LDLCALC", "TRIG", "CHOLHDL", "LDLDIRECT" in the last 72 hours. Thyroid Function Tests: No results for input(s): "TSH", "T4TOTAL", "FREET4", "T3FREE", "THYROIDAB" in the last 72 hours. Anemia Panel: No results for input(s): "VITAMINB12", "FOLATE", "FERRITIN", "TIBC", "IRON", "RETICCTPCT" in the last 72 hours. Sepsis Labs: No results for input(s): "PROCALCITON", "LATICACIDVEN" in the last 168 hours.  Recent Results (from the past 240 hour(s))  Urine Culture     Status: Abnormal   Collection Time: 02/09/23 12:41 AM   Specimen: Urine, Random  Result  Value Ref Range Status   Specimen Description URINE, RANDOM  Final   Special Requests   Final    URINE, CLEAN CATCH Performed at [facility], [address] [place], [place] [address]    Culture >=100,000 COLONIES/mL ESCHERICHIA COLI (A)  Final   Report Status 02/11/2023 FINAL  Final   Organism ID, Bacteria ESCHERICHIA COLI (A)  Final      Susceptibility   Escherichia coli - MIC*    AMPICILLIN <=2 SENSITIVE Sensitive     CEFAZOLIN <=4 SENSITIVE Sensitive     CEFEPIME <=0.12 SENSITIVE Sensitive     CEFTRIAXONE <=0.25 SENSITIVE Sensitive      CIPROFLOXACIN <=0.25 SENSITIVE Sensitive     GENTAMICIN <=1 SENSITIVE Sensitive     IMIPENEM <=0.25 SENSITIVE Sensitive     NITROFURANTOIN <=16 SENSITIVE Sensitive     TRIMETH/SULFA <=20 SENSITIVE Sensitive     AMPICILLIN/SULBACTAM <=2 SENSITIVE Sensitive     PIP/TAZO <=4 SENSITIVE Sensitive     * >=100,000 COLONIES/mL ESCHERICHIA COLI  Surgical pcr screen     Status: None   Collection Time: 02/22/2023  7:26 AM   Specimen: Nasal Mucosa; Nasal Swab  Result Value Ref Range Status   MRSA, PCR NEGATIVE NEGATIVE Final   Staphylococcus aureus NEGATIVE NEGATIVE Final    Comment: (NOTE) The Xpert SA Assay (FDA approved for NASAL specimens in patients 26 years of age and older), is one component of a comprehensive surveillance program. It is not intended to diagnose infection nor to guide or monitor treatment. Performed at [facility], [address] [place], [place] [address]          Radiology Studies: DG Abd 1 View  Result Date: 02/15/2023 CLINICAL DATA:  Abdominal pain. EXAM: ABDOMEN - 1 VIEW COMPARISON:  04/28/2017 FINDINGS: Single supine view of the abdomen and pelvis. Dual lead pacer. Embolization coils in the right upper abdomen. Interval aortic stent graft repair. No gaseous distention of bowel loops. No gross free intraperitoneal air. Curvilinear calcification of 2.8 cm corresponds to an exophytic left renal lesion. Vertebral augmentation within the lumbar spine. IMPRESSION: No acute findings. Interval stent graft repair of known abdominal aortic aneurysm. Electronically Signed   By: Jeronimo Greaves M.D.   On: 02/15/2023 13:29   PERIPHERAL VASCULAR CATHETERIZATION  Result Date: 02/13/2023 See surgical note for result.       Scheduled Meds:  cephALEXin  500 mg Oral BID   dabigatran  75 mg Oral Q12H   diltiazem  120 mg Oral QHS   docusate sodium  100 mg Oral Daily   furosemide  40 mg Oral Daily   [START ON 02/17/2023] gabapentin  300 mg Oral BID   isosorbide  mononitrate  30 mg Oral Daily   magnesium hydroxide  30 mL Oral Daily   metoprolol succinate  25 mg Oral Daily   polyethylene glycol  17 g Oral Daily   rosuvastatin  5 mg Oral QHS   valACYclovir  1,000 mg Oral BID   Continuous Infusions:  sodium chloride     magnesium sulfate bolus IVPB            Glade Lloyd, MD [facility] 02/16/2023, 10:37 AM

## 2023-02-16 NOTE — Progress Notes (Signed)
Mobility Specialist Progress Note    02/16/23 1425  Mobility  Activity Ambulated with assistance in room  Level of Assistance Minimal assist, patient does 75% or more  Assistive Device Other (Comment) (HHA)  Distance Ambulated (ft) 5 ft  Activity Response Tolerated well  Mobility Referral Yes  $Mobility charge 1 Mobility  Mobility Specialist Start Time (ACUTE ONLY) 1407  Mobility Specialist Stop Time (ACUTE ONLY) 1423  Mobility Specialist Time Calculation (min) (ACUTE ONLY) 16 min   Pt received in chair and agreeable to get back to chair. No complaints. Left with call bell in reach and family present.   [place] Mobility Specialist  Please Neurosurgeon or Rehab Office at [phone]

## 2023-02-16 NOTE — Evaluation (Signed)
Occupational Therapy Evaluation Patient Details Name: [name] MRN: [id number] DOB: 12/03/1925 Today's Date: 02/16/2023   History of Present Illness 89 yo female presents to [facility] on Mar 01, 2023 with worsening abdominal pain due to known AAA. S/p abdominal aortogram with iliac arteriogram including catheter selection of aorta, aortobiliac stent graft for repair of AAA via bilat femoral access on 10/4. PMH includes GERD, known AAA, CAD, COPD, CVA, cancer, afib, HTN, pacemaker, cardiac cath.   Clinical Impression   Pt reports having assist at baseline with ADLs from aide, uses RW for mobility, lives alone but has aide 12 hours/day. Pt currently needing set up -mod A for ADLs, supervision for bed mobility and min A for transfers with RW. Pt with x1 LOB anteriorly needing min A to correct. Pt with decr cognition, needs cues to recall why she is in the hospital and is perseverative on asking for her son and stating she is "in [place]". Pt presenting with impairments listed below, will follow acutely. Recommend HHOT d/c as long as pt has 24/7 assist from aide/family.       If plan is discharge home, recommend the following: A little help with walking and/or transfers;Assistance with cooking/housework;Assist for transportation;Help with stairs or ramp for entrance;Supervision due to cognitive status;A lot of help with bathing/dressing/bathroom    Functional Status Assessment  Patient has had a recent decline in their functional status and demonstrates the ability to make significant improvements in function in a reasonable and predictable amount of time.  Equipment Recommendations  BSC/3in1    Recommendations for Other Services PT consult     Precautions / Restrictions Precautions Precautions: Fall Restrictions Weight Bearing Restrictions: No      Mobility Bed Mobility Overal bed mobility: Needs Assistance Bed Mobility: Supine to Sit, Sit to Supine     Supine to sit: Supervision,  Used rails, HOB elevated          Transfers Overall transfer level: Needs assistance Equipment used: Rolling walker (2 wheels) Transfers: Sit to/from Stand Sit to Stand: Min assist                  Balance Overall balance assessment: Needs assistance Sitting-balance support: No upper extremity supported, Feet supported Sitting balance-Leahy Scale: Fair     Standing balance support: Bilateral upper extremity supported, During functional activity, Reliant on assistive device for balance Standing balance-Leahy Scale: Poor                             ADL either performed or assessed with clinical judgement   ADL Overall ADL's : Needs assistance/impaired Eating/Feeding: Set up;Sitting   Grooming: Minimal assistance   Upper Body Bathing: Minimal assistance;Sitting   Lower Body Bathing: Moderate assistance   Upper Body Dressing : Minimal assistance   Lower Body Dressing: Moderate assistance   Toilet Transfer: Minimal assistance;Ambulation;Regular Social worker and Hygiene: Supervision/safety       Functional mobility during ADLs: Minimal assistance;Rolling walker (2 wheels)       Vision   Additional Comments: R visual deficit at baseline     Perception Perception: Not tested       Praxis Praxis: Not tested       Pertinent Vitals/Pain Pain Assessment Pain Assessment: Faces Pain Score: 4  Faces Pain Scale: Hurts little more Pain Location: from rash and groin sites Pain Descriptors / Indicators: Discomfort, Grimacing Pain Intervention(s): Limited activity within patient's tolerance, Monitored during  session, Repositioned     Extremity/Trunk Assessment Upper Extremity Assessment Upper Extremity Assessment: Generalized weakness   Lower Extremity Assessment Lower Extremity Assessment: Defer to PT evaluation   Cervical / Trunk Assessment Cervical / Trunk Assessment: Kyphotic   Communication  Communication Communication: Hearing impairment Cueing Techniques: Verbal cues;Gestural cues   Cognition Arousal: Alert Behavior During Therapy: WFL for tasks assessed/performed Overall Cognitive Status: No family/caregiver present to determine baseline cognitive functioning                                 General Comments: continues to ask for her sons despite orientation, states "i'm [place]"     General Comments  VSS on RA, incision CDI    Exercises     Shoulder Instructions      Home Living Family/patient expects to be discharged to:: Private residence Living Arrangements: Alone Available Help at Discharge: Personal care attendant;Family (aide 8am-8pm) Type of Home: House Home Access: Ramped entrance     Home Layout: Two level     Bathroom Shower/Tub: Chief Strategy Officer: Handicapped height Bathroom Accessibility: Yes   Home Equipment: Agricultural consultant (2 wheels);Rollator (4 wheels);BSC/3in1;Tub bench          Prior Functioning/Environment Prior Level of Function : Needs assist             Mobility Comments: uses RW for mobility ADLs Comments: aides help pt get dressed and bathe herself        OT Problem List: Decreased strength;Decreased range of motion;Decreased activity tolerance      OT Treatment/Interventions: Self-care/ADL training;Therapeutic exercise;Energy conservation;DME and/or AE instruction;Therapeutic activities;Patient/family education;Balance training;Cognitive remediation/compensation;Visual/perceptual remediation/compensation    OT Goals(Current goals can be found in the care plan section) Acute Rehab OT Goals Patient Stated Goal: none stated OT Goal Formulation: With patient Time For Goal Achievement: 03/02/23 Potential to Achieve Goals: Good ADL Goals Pt Will Perform Upper Body Dressing: Independently;sitting Pt Will Perform Lower Body Dressing: Independently;sit to/from  stand;sitting/lateral leans Pt Will Transfer to Toilet: Independently;ambulating;regular height toilet Pt/caregiver will Perform Home Exercise Program: Both right and left upper extremity;With written HEP provided Additional ADL Goal #1: pt will be able to stand x10 min in order to improve activity tolerance for ADLs  OT Frequency: Min 1X/week    Co-evaluation              AM-PAC OT "6 Clicks" Daily Activity     Outcome Measure Help from another person eating meals?: A Little Help from another person taking care of personal grooming?: A Little Help from another person toileting, which includes using toliet, bedpan, or urinal?: A Little Help from another person bathing (including washing, rinsing, drying)?: A Lot Help from another person to put on and taking off regular upper body clothing?: A Little Help from another person to put on and taking off regular lower body clothing?: A Lot 6 Click Score: 16   End of Session Equipment Utilized During Treatment: Rolling walker (2 wheels) Nurse Communication: Mobility status  Activity Tolerance: Patient tolerated treatment well Patient left: in chair;with call bell/phone within reach;with chair alarm set  OT Visit Diagnosis: Unsteadiness on feet (R26.81);Other abnormalities of gait and mobility (R26.89);Muscle weakness (generalized) (M62.81)                Time: 7829-5621 OT Time Calculation (min): 32 min Charges:  OT General Charges $OT Visit: 1 Visit OT Evaluation $OT  Eval Moderate Complexity: 1 Mod OT Treatments $Self Care/Home Management : 8-22 mins  Carver Fila, OTD, OTR/L SecureChat Preferred Acute Rehab [phone]   Carver Fila Koonce 02/16/2023, 12:08 PM

## 2023-02-16 NOTE — Plan of Care (Signed)
?  Problem: Clinical Measurements: ?Goal: Will remain free from infection ?Outcome: Progressing ?  ?

## 2023-02-16 NOTE — Progress Notes (Signed)
[facility] [place] PROGRESS NOTE  ASSESSMENT / PLAN: [name] is a 89 y.o. female status post EVAR for large infrarenal abdominal aortic aneurysm.  Occasional abdominal pain - KUB yesterday appears consistent with constipation. Continue supportive care.   SUBJECTIVE: No complaints. Sleeping comfortable.  OBJECTIVE: BP (!) 115/93 (BP Location: Right Arm)   Pulse 72   Temp 98.1 F (36.7 C) (Oral)   Resp 16   Ht 5\' 2"  (1.575 m)   Wt 54.2 kg   SpO2 94%   BMI 21.85 kg/m   Intake/Output Summary (Last 24 hours) at 02/16/2023 1005 Last data filed at 02/15/2023 1600 Gross per 24 hour  Intake --  Output 300 ml  Net -300 ml    No distress Regular rate and rhythm Unlabored breathing Soft, non-tender abdomen Groins soft and without hematoma     Latest Ref Rng & Units 02/16/2023    3:40 AM 02/15/2023    3:54 AM 03/04/2023    6:12 PM  CBC  WBC 4.0 - 10.5 K/uL 22.7  14.9  13.2   Hemoglobin 12.0 - 15.0 g/dL 16.1  09.6  04.5   Hematocrit 36.0 - 46.0 % 42.5  42.9  41.1   Platelets 150 - 400 K/uL 141  99  122         Latest Ref Rng & Units 02/16/2023    3:40 AM 02/15/2023    3:54 AM 02/20/2023    6:12 PM  CMP  Glucose 70 - 99 mg/dL 409  811  914   BUN 8 - 23 mg/dL 20  21  19    Creatinine 0.44 - 1.00 mg/dL 7.82  9.56  2.13   Sodium 135 - 145 mmol/L 132  135  135   Potassium 3.5 - 5.1 mmol/L 4.2  4.4  4.0   Chloride 98 - 111 mmol/L 96  98  99   CO2 22 - 32 mmol/L 23  21  24    Calcium 8.9 - 10.3 mg/dL 8.6  8.1  7.9     Estimated Creatinine Clearance: 31.3 mL/min (by C-G formula based on SCr of 0.85 mg/dL).  Rande Brunt. Lenell Antu, MD [facility] Phone Number: [phone] 02/16/2023 10:05 AM

## 2023-02-17 ENCOUNTER — Encounter (HOSPITAL_COMMUNITY): Payer: Self-pay | Admitting: Vascular Surgery

## 2023-02-17 DIAGNOSIS — Z515 Encounter for palliative care: Secondary | ICD-10-CM | POA: Diagnosis not present

## 2023-02-17 DIAGNOSIS — Z7189 Other specified counseling: Secondary | ICD-10-CM

## 2023-02-17 DIAGNOSIS — I714 Abdominal aortic aneurysm, without rupture, unspecified: Secondary | ICD-10-CM | POA: Diagnosis not present

## 2023-02-17 LAB — CBC WITH DIFFERENTIAL/PLATELET
Abs Immature Granulocytes: 0.29 10*3/uL — ABNORMAL HIGH (ref 0.00–0.07)
Basophils Absolute: 0 10*3/uL (ref 0.0–0.1)
Basophils Relative: 0 %
Eosinophils Absolute: 0 10*3/uL (ref 0.0–0.5)
Eosinophils Relative: 0 %
HCT: 45.3 % (ref 36.0–46.0)
Hemoglobin: 14.9 g/dL (ref 12.0–15.0)
Immature Granulocytes: 1 %
Lymphocytes Relative: 3 %
Lymphs Abs: 0.6 10*3/uL — ABNORMAL LOW (ref 0.7–4.0)
MCH: 31.4 pg (ref 26.0–34.0)
MCHC: 32.9 g/dL (ref 30.0–36.0)
MCV: 95.6 fL (ref 80.0–100.0)
Monocytes Absolute: 1.7 10*3/uL — ABNORMAL HIGH (ref 0.1–1.0)
Monocytes Relative: 8 %
Neutro Abs: 18.4 10*3/uL — ABNORMAL HIGH (ref 1.7–7.7)
Neutrophils Relative %: 88 %
Platelets: 107 10*3/uL — ABNORMAL LOW (ref 150–400)
RBC: 4.74 MIL/uL (ref 3.87–5.11)
RDW: 15.5 % (ref 11.5–15.5)
WBC: 21.1 10*3/uL — ABNORMAL HIGH (ref 4.0–10.5)
nRBC: 0 % (ref 0.0–0.2)

## 2023-02-17 LAB — BASIC METABOLIC PANEL
Anion gap: 13 (ref 5–15)
BUN: 19 mg/dL (ref 8–23)
CO2: 23 mmol/L (ref 22–32)
Calcium: 8.5 mg/dL — ABNORMAL LOW (ref 8.9–10.3)
Chloride: 94 mmol/L — ABNORMAL LOW (ref 98–111)
Creatinine, Ser: 0.8 mg/dL (ref 0.44–1.00)
GFR, Estimated: >60 mL/min (ref >60)
Glucose, Bld: 121 mg/dL — ABNORMAL HIGH (ref 70–99)
Potassium: 4.3 mmol/L (ref 3.5–5.1)
Sodium: 130 mmol/L — ABNORMAL LOW (ref 135–145)

## 2023-02-17 LAB — MAGNESIUM: Magnesium: 2.4 mg/dL (ref 1.7–2.4)

## 2023-02-17 MED ORDER — GABAPENTIN 100 MG PO CAPS
100.0000 mg | ORAL_CAPSULE | Freq: Two times a day (BID) | ORAL | Status: DC
  Administered 2023-02-17 (×2): 100 mg via ORAL
  Filled 2023-02-17 (×3): qty 1

## 2023-02-17 MED ORDER — BISACODYL 10 MG RE SUPP: 10.0000 mg | Freq: Every day | RECTAL | Status: DC | PRN

## 2023-02-17 MED ORDER — MORPHINE SULFATE (PF) 2 MG/ML IV SOLN
2.0000 mg | Freq: Once | INTRAVENOUS | Status: AC
  Administered 2023-02-17: 2 mg via INTRAVENOUS
  Filled 2023-02-17: qty 1

## 2023-02-17 MED ORDER — ORAL CARE MOUTH RINSE: 15.0000 mL | OROMUCOSAL | Status: DC | PRN

## 2023-02-17 MED ORDER — KETOROLAC TROMETHAMINE 15 MG/ML IJ SOLN
15.0000 mg | Freq: Once | INTRAMUSCULAR | Status: AC
  Administered 2023-02-17: 15 mg via INTRAVENOUS
  Filled 2023-02-17: qty 1

## 2023-02-17 MED ORDER — CELECOXIB 100 MG PO CAPS
100.0000 mg | ORAL_CAPSULE | Freq: Two times a day (BID) | ORAL | Status: DC
  Administered 2023-02-17 – 2023-02-19 (×4): 100 mg via ORAL
  Filled 2023-02-17 (×5): qty 1

## 2023-02-17 MED ORDER — LIDOCAINE 5 % EX PTCH
1.0000 | MEDICATED_PATCH | CUTANEOUS | Status: DC
  Administered 2023-02-17 – 2023-02-18 (×2): 1 via TRANSDERMAL
  Filled 2023-02-17 (×3): qty 1

## 2023-02-17 NOTE — Progress Notes (Addendum)
Palliative Medicine Inpatient Follow Up Note   HPI: 89 y.o. female  with past medical history of  abdominal aortic aneurysm (previously followed with Dr. Florene Route surgery (In 2022, aneurysm size was 6.2, patient had declined surgical workup and was no longer following up with vascular surgery), persistent A-fib on Pradaxa, complete heart block status post pacemaker, moderate CAD without obstructive lesions, COPD, aortic insufficiency, type a dissection of descending thoracic aorta, hypertension, hyperlipidemia presented with worsening abdominal pain. She had presented to the ED with abdominal and back pain few days ago. CT showed increased size of abdominal aortic aneurysm up to 7.8 cm without rupture. Patient declined any intervention. She also had evidence of UTI and was discharged on oral Keflex and pain meds. Patient subsequently has changed her mind and wanted to speak to vascular surgery for possible surgical intervention.  On presentation, vascular surgery was consulted.     Palliative medicine was consulted for GOC conversations.  Today's Discussion 02/17/2023  *Please note that this is a verbal dictation therefore any spelling or grammatical errors are due to the "[employer] One" system interpretation.  Chart reviewed inclusive of vital signs, progress notes, laboratory results, and diagnostic images.   I spoke with [name]'s RN, [name] who shares that [name] appears miserable and continues to say she is ready to die.   I met with [name] at bedside this morning, she appeared to be in tremendous pain and when asked about it she states that she is experiencing this "all over". She asks to give her something to end her "suffering". I offered that I will work on her pain management,  I met with [name]'s two sons at bedside. [name] remains in horrible pain. We discussed a plan to get her pain under better control as it is hard to ascertain wishes in the setting of an acute pain exacerbation. We  reviewed how surgery and shingles works on pain receptors.   I did explain comfort care should [name]'s clinical state decline. We reviewed that at anytime we can change focus.   Created space and opportunity for patients children to explore thoughts feelings and fears regarding  [name] current medical situation.We reviewed that [name] had decided to pursue surgery as she did not want to sit waiting for her AAA to rupture. Patients sons are at peace with her decision though are hopeful she can be better optimized.   I spent time helping the nursing staff get [name] and changing her. She was able to mobilize well.   Questions and concerns addressed/Palliative Support Provided.  ____________________________________ Addendum:  I met with [name] two children and [name]. We discussed again the conversation as about.   Patients family are interested to learn more about the hospice option. I described hospice as a service for patients who have a life expectancy of 6 months or less.   The goal of hospice is the preservation of dignity and quality at the end phases of life. Under hospice care, the focus changes from curative to symptom relief. They are agreeable to meet with [facility].  In the meanwhile, I confirmed the plan to continue working on pain management.   Objective Assessment: Vital Signs Vitals:   02/17/23 0300 02/17/23 0732  BP: (!) 156/70 (!) 175/76  Pulse:  70  Resp: 18 (!) 28  Temp: 98.6 F (37 C) (!) 97.4 F (36.3 C)  SpO2: 98% 100%    Intake/Output Summary (Last 24 hours) at 02/17/2023 1128 Last data filed at  02/16/2023 1300 Gross per 24 hour  Intake 240 ml  Output --  Net 240 ml   Last Weight  Most recent update: 02/17/2023  8:59 AM    Weight  57.7 kg (127 lb 3.3 oz)            Gen:  Elderly Caucasian F in NAD HEENT: moist mucous membranes CV: Regular rate and rhythm  PULM: On RA, breathing is even and nonlabored ABD: soft/nontender  EXT: No edema   Neuro: Alert and oriented x3   SUMMARY OF RECOMMENDATIONS   DNAR/DNI  Symptoms as Below  Discussed options related to continued aggressive care versus comfort measures  Goals at this time are to optimize pain  Ongoing PMT support  Symptoms: Acute Pain, Generalized: - Morphine 2mg  IVP Q2H PRN - Morphine 2mg  IVP x1 - Tordol 15mg  IVP x1 - Celebrex 100mg  PO BID x5 days - Oxycodone 5mg  PO Q4H PRN - Gabapentin 100mg  PO BID - Lidoderm Patch to back  Itching: - Continue hydrocortisone % TID PRN  Bowel Ppx: - Miralx 17g PO Daily  Time Spent: 70 ______________________________________________________________________________________ Lamarr Lulas [place] Palliative Medicine Team Team Cell Phone: [phone] Please utilize secure chat with additional questions, if there is no response within 30 minutes please call the above phone number  Palliative Medicine Team providers are available by phone from 7am to 7pm daily and can be reached through the team cell phone.  Should this patient require assistance outside of these hours, please call the patient's attending physician.

## 2023-02-17 NOTE — Plan of Care (Signed)

## 2023-02-17 NOTE — Progress Notes (Signed)
PROGRESS NOTE    [name]  NFA:[id number] DOB: [birth year] DOA: 02/23/2023 PCP: [name], MD   Brief Narrative:  89 y.o. female with medical history significant of abdominal aortic aneurysm (previously followed with Dr. Florene Route surgery: In 2022, aneurysm size was 6.2, patient had declined surgical workup and was no longer following up with vascular surgery), persistent A-fib on Pradaxa, complete heart block status post pacemaker, moderate CAD without obstructive lesions, COPD, aortic insufficiency, type a dissection of descending thoracic aorta, hypertension, hyperlipidemia presented with worsening abdominal pain. She had presented to the ED with abdominal and back pain few days ago. CT showed increased size of abdominal aortic aneurysm up to 7.8 cm without rupture. Patient declined any intervention. She also had evidence of UTI and was discharged on oral Keflex and pain meds. Patient subsequently has changed her mind and wanted to speak to vascular surgery for possible surgical intervention.  On presentation, vascular surgery was consulted.  She underwent EVAR by vascular surgery on March 15, 2023.  Subsequently, she developed possible shingles rash around right neck and has been started on Valtrex.  Assessment & Plan:   Enlarging AAA presenting with worsening abdominal pain -previously followed with Dr. Florene Route surgery: In 2022, aneurysm size was 6.2, patient had declined surgical workup and was no longer following up with vascular surgery -Recent CT had shown increased size of abdominal aortic aneurysm up to 7.8 cm without rupture.  Patient declined any intervention. - Patient subsequently changed her mind and now wants surgical intervention. -Repeat CT angio on presentation showed increased size of infrarenal abdominal arctic aneurysm measuring 7.8 x 7.8 cm.   -underwent EVAR by vascular surgery on 03/15/23.  Vascular surgery has cleared the patient for discharge with  outpatient follow-up.  Chronic diastolic heart failure -Compensated.  Strict input and output.  Daily weights.  Fluid restriction.  Continue Lasix, Imdur and metoprolol. -Echo showed EF of 60 to 65% with moderate to severe aortic valve stenosis.  Doubt that she will be a candidate for any surgical intervention.  Follow further palliative care discussions.  Possible shingles rash around the right neck -Patient developed painful blistering rash and has been started on Valtrex.  Continue Valtrex for a week.  Continue isolation.  Continue pain management.  Gabapentin was discontinued on 02/16/2023 because of patient being very sleepy.  Will resume gabapentin at a lower dose and titrate up if needed.  Leukocytosis -WBCs had worsened to 22.7 on 02/16/2023.  Labs pending for today.  Monitor.  Thrombocytopenia -Questionable cause.  Monitor.  Hyponatremia -Labs pending today.  Monitor.  Hypertension -Monitor blood pressure.  Continue Cardizem, Imdur, Lasix and metoprolol.   Persistent A-fib -Pradaxa 02/15/2023 onwards continue Cardizem and monitor well.  Outpatient follow-up with cardiology   History of complete heart block status post pacemaker -Outpatient follow-up with cardiology   Moderate CAD without obstructing lesions -Stable.  Outpatient follow-up with cardiology   Hyperlipidemia -Continue statin   Recent diagnosis of UTI -Had E. coli growing in recent urine cultures from 02/09/2023.  Completed a course of Keflex.  Goals of care -Palliative care following.     DVT prophylaxis: SCDs Code Status: DNR Family Communication: Son on 02/17/2023 on phone  disposition Plan: Status is: Inpatient Remains inpatient appropriate because: Of severity of illness  Consultants: Vascular surgery/palliative care  Procedures: As above Antimicrobials: Keflex  Subjective: Patient seen and examined at bedside.  Poor historian.  No fever, seizures or vomiting reported.  Nursing staff reported  that patient complained of increased neck  pain.   Objective: Vitals:   02/16/23 1517 02/16/23 2302 02/17/23 0300 02/17/23 0732  BP:  (!) 146/68 (!) 156/70 (!) 175/76  Pulse:    70  Resp: 20 17 18  (!) 28  Temp:  98 F (36.7 C) 98.6 F (37 C) (!) 97.4 F (36.3 C)  TempSrc:  Oral Oral Axillary  SpO2:  94% 98% 100%  Weight:    57.7 kg  Height:        Intake/Output Summary (Last 24 hours) at 02/17/2023 1122 Last data filed at 02/16/2023 1300 Gross per 24 hour  Intake 240 ml  Output --  Net 240 ml   Filed Weights   02/25/2023 1215 March 01, 2023 0500 02/17/23 0732  Weight: 53.1 kg 54.2 kg 57.7 kg    Examination:  General: No distress.  Currently on room air.  Elderly female lying in bed. ENT/neck: No obvious JVD elevation or palpable neck masses noted.   Respiratory: Bilateral decreased sounds at bases with scattered crackles with intermittent tachypnea CVS: Rate mostly controlled; S1 and S2 heard  abdominal: Soft, distended and mildly tender; no organomegaly, normal bowel sounds heard  extremities: No cyanosis; trace lower extremity edema present  CNS: Still very slow to respond and poor historian.  Awake.  No focal deficits noted  lymph: No lymphadenopathy palpable  skin: Vesicular rashes noted however right side of the neck psych: Not agitated.  Flat affect Musculoskeletal: No obvious joint erythema/deformity  Data Reviewed: I have personally reviewed following labs and imaging studies  CBC: Recent Labs  Lab 02/13/23 0449 2023/03/01 0415 Mar 01, 2023 1812 02/15/23 0354 02/16/23 0340  WBC 12.6* 14.4* 13.2* 14.9* 22.7*  NEUTROABS 10.4* 11.5*  --  13.6* 19.9*  HGB 14.4 15.3* 13.5 14.3 14.1  HCT 44.2 45.9 41.1 42.9 42.5  MCV 93.1 95.0 95.6 95.1 92.6  PLT 139* 155 122* 99* 141*   Basic Metabolic Panel: Recent Labs  Lab 02/13/23 0449 01-Mar-2023 0415 March 01, 2023 1812 02/15/23 0354 02/16/23 0340  NA 136 134* 135 135 132*  K 3.9 4.0 4.0 4.4 4.2  CL 98 94* 99 98 96*  CO2 27 26 24   21* 23  GLUCOSE 107* 107* 163* 138* 129*  BUN 19 20 19 21 20   CREATININE 0.88 1.00 0.93 0.90 0.85  CALCIUM 8.6* 8.6* 7.9* 8.1* 8.6*  MG 2.4 2.5* 2.2 2.2 2.3   GFR: Estimated Creatinine Clearance: 31.3 mL/min (by C-G formula based on SCr of 0.85 mg/dL). Liver Function Tests: Recent Labs  Lab 03/05/2023 1241 02/13/23 0449  AST 36 28  ALT 43 34  ALKPHOS 137* 125  BILITOT 1.5* 1.9*  PROT 5.7* 5.6*  ALBUMIN 3.2* 3.0*   No results for input(s): "LIPASE", "AMYLASE" in the last 168 hours.  No results for input(s): "AMMONIA" in the last 168 hours. Coagulation Profile: Recent Labs  Lab 02/13/23 0449 2023/03/01 1812  INR 1.3* 1.4*   Cardiac Enzymes: No results for input(s): "CKTOTAL", "CKMB", "CKMBINDEX", "TROPONINI" in the last 168 hours. BNP (last 3 results) No results for input(s): "PROBNP" in the last 8760 hours. HbA1C: No results for input(s): "HGBA1C" in the last 72 hours. CBG: No results for input(s): "GLUCAP" in the last 168 hours. Lipid Profile: No results for input(s): "CHOL", "HDL", "LDLCALC", "TRIG", "CHOLHDL", "LDLDIRECT" in the last 72 hours. Thyroid Function Tests: No results for input(s): "TSH", "T4TOTAL", "FREET4", "T3FREE", "THYROIDAB" in the last 72 hours. Anemia Panel: No results for input(s): "VITAMINB12", "FOLATE", "FERRITIN", "TIBC", "IRON", "RETICCTPCT" in the last 72 hours. Sepsis Labs: No results  for input(s): "PROCALCITON", "LATICACIDVEN" in the last 168 hours.  Recent Results (from the past 240 hour(s))  Urine Culture     Status: Abnormal   Collection Time: 02/09/23 12:41 AM   Specimen: Urine, Random  Result Value Ref Range Status   Specimen Description URINE, RANDOM  Final   Special Requests   Final    URINE, CLEAN CATCH Performed at [facility], [address], [place], [place] [address]    Culture >=100,000 COLONIES/mL ESCHERICHIA COLI (A)  Final   Report Status 02/11/2023 FINAL  Final   Organism ID, Bacteria ESCHERICHIA COLI (A)   Final      Susceptibility   Escherichia coli - MIC*    AMPICILLIN <=2 SENSITIVE Sensitive     CEFAZOLIN <=4 SENSITIVE Sensitive     CEFEPIME <=0.12 SENSITIVE Sensitive     CEFTRIAXONE <=0.25 SENSITIVE Sensitive     CIPROFLOXACIN <=0.25 SENSITIVE Sensitive     GENTAMICIN <=1 SENSITIVE Sensitive     IMIPENEM <=0.25 SENSITIVE Sensitive     NITROFURANTOIN <=16 SENSITIVE Sensitive     TRIMETH/SULFA <=20 SENSITIVE Sensitive     AMPICILLIN/SULBACTAM <=2 SENSITIVE Sensitive     PIP/TAZO <=4 SENSITIVE Sensitive     * >=100,000 COLONIES/mL ESCHERICHIA COLI  Surgical pcr screen     Status: None   Collection Time: 02/24/2023  7:26 AM   Specimen: Nasal Mucosa; Nasal Swab  Result Value Ref Range Status   MRSA, PCR NEGATIVE NEGATIVE Final   Staphylococcus aureus NEGATIVE NEGATIVE Final    Comment: (NOTE) The Xpert SA Assay (FDA approved for NASAL specimens in patients 59 years of age and older), is one component of a comprehensive surveillance program. It is not intended to diagnose infection nor to guide or monitor treatment. Performed at [facility], [address] [place], [place] [address]          Radiology Studies: DG Abd 1 View  Result Date: 02/15/2023 CLINICAL DATA:  Abdominal pain. EXAM: ABDOMEN - 1 VIEW COMPARISON:  04/28/2017 FINDINGS: Single supine view of the abdomen and pelvis. Dual lead pacer. Embolization coils in the right upper abdomen. Interval aortic stent graft repair. No gaseous distention of bowel loops. No gross free intraperitoneal air. Curvilinear calcification of 2.8 cm corresponds to an exophytic left renal lesion. Vertebral augmentation within the lumbar spine. IMPRESSION: No acute findings. Interval stent graft repair of known abdominal aortic aneurysm. Electronically Signed   By: Jeronimo Greaves M.D.   On: 02/15/2023 13:29        Scheduled Meds:  dabigatran  75 mg Oral Q12H   diltiazem  120 mg Oral QHS   docusate sodium  100 mg Oral Daily    furosemide  40 mg Oral Daily   gabapentin  100 mg Oral BID   isosorbide mononitrate  30 mg Oral Daily   ketorolac  15 mg Intravenous Once   magnesium hydroxide  30 mL Oral Daily   metoprolol succinate  25 mg Oral Daily    morphine injection  2 mg Intravenous Once   polyethylene glycol  17 g Oral Daily   rosuvastatin  5 mg Oral QHS   valACYclovir  1,000 mg Oral BID   Continuous Infusions:  sodium chloride     magnesium sulfate bolus IVPB            Glade Lloyd, MD [facility] 02/17/2023, 11:22 AM

## 2023-02-17 NOTE — Progress Notes (Signed)
[facility] [place] [place] [facility]   The pt was referred to hospice care at home. I have met with the pt's son Trey Paula and his wife. There was also another son present who lives in [place] at bedside. We discussed hospice care and the interdisplinary team approach at home. The pt is a DNR. The family is considering all there options and would like some time to discuss with other family members before committing to the hospice services.   I will continue to follow and help assist with d/c plan home with hospice if this is what family decides is best for the pt.   Norm Parcel RN [phone]

## 2023-02-17 NOTE — Progress Notes (Signed)
[facility]  Subjective  -complaining of pain all over.  States her neck hurts the most with vesicles being treated for shingles.   Objective (!) 175/76 70 (!) 97.4 F (36.3 C) (Axillary) (!) 28 100%  Intake/Output Summary (Last 24 hours) at 02/17/2023 0820 Last data filed at 02/16/2023 1300 Gross per 24 hour  Intake 240 ml  Output --  Net 240 ml    Bilateral femoral pulses soft without hematoma Bilateral femoral pulses palpable Brisk DP signals bilateral lower extremities Abdomen soft without significant rebound or guarding  Laboratory Lab Results: Recent Labs    02/15/23 0354 02/16/23 0340  WBC 14.9* 22.7*  HGB 14.3 14.1  HCT 42.9 42.5  PLT 99* 141*   BMET Recent Labs    02/15/23 0354 02/16/23 0340  NA 135 132*  K 4.4 4.2  CL 98 96*  CO2 21* 23  GLUCOSE 138* 129*  BUN 21 20  CREATININE 0.90 0.85  CALCIUM 8.1* 8.6*    COAG Lab Results  Component Value Date   INR 1.4 (H) Mar 15, 2023   INR 1.3 (H) 02/13/2023   INR 1.9 (H) 02/06/2021   No results found for: "PTT"  Assessment/Planning:  Postop day 3 status post EVAR for large 7.8 cm symptomatic abdominal aortic aneurysm.  Groins look good and she has brisk Doppler signals in both feet.  Complaining of pain all over and states she wants to die.  Now being treated for shingles with large vesicles on her neck that she states are very painful.  Cephus Shelling 02/17/2023 8:20 AM --

## 2023-02-18 ENCOUNTER — Encounter (HOSPITAL_COMMUNITY): Payer: Self-pay | Admitting: Internal Medicine

## 2023-02-18 DIAGNOSIS — I714 Abdominal aortic aneurysm, without rupture, unspecified: Secondary | ICD-10-CM | POA: Diagnosis not present

## 2023-02-18 DIAGNOSIS — Z7189 Other specified counseling: Secondary | ICD-10-CM | POA: Diagnosis not present

## 2023-02-18 DIAGNOSIS — Z515 Encounter for palliative care: Secondary | ICD-10-CM | POA: Diagnosis not present

## 2023-02-18 LAB — CBC WITH DIFFERENTIAL/PLATELET
Abs Immature Granulocytes: 0.15 10*3/uL — ABNORMAL HIGH (ref 0.00–0.07)
Basophils Absolute: 0 10*3/uL (ref 0.0–0.1)
Basophils Relative: 0 %
Eosinophils Absolute: 0 10*3/uL (ref 0.0–0.5)
Eosinophils Relative: 0 %
HCT: 41.9 % (ref 36.0–46.0)
Hemoglobin: 13.8 g/dL (ref 12.0–15.0)
Immature Granulocytes: 1 %
Lymphocytes Relative: 6 %
Lymphs Abs: 1 10*3/uL (ref 0.7–4.0)
MCH: 30.3 pg (ref 26.0–34.0)
MCHC: 32.9 g/dL (ref 30.0–36.0)
MCV: 91.9 fL (ref 80.0–100.0)
Monocytes Absolute: 1.2 10*3/uL — ABNORMAL HIGH (ref 0.1–1.0)
Monocytes Relative: 8 %
Neutro Abs: 13.8 10*3/uL — ABNORMAL HIGH (ref 1.7–7.7)
Neutrophils Relative %: 85 %
Platelets: 123 10*3/uL — ABNORMAL LOW (ref 150–400)
RBC: 4.56 MIL/uL (ref 3.87–5.11)
RDW: 15.4 % (ref 11.5–15.5)
WBC: 16.2 10*3/uL — ABNORMAL HIGH (ref 4.0–10.5)
nRBC: 0 % (ref 0.0–0.2)

## 2023-02-18 LAB — COMPREHENSIVE METABOLIC PANEL
ALT: 27 U/L (ref 0–44)
AST: 31 U/L (ref 15–41)
Albumin: 2.7 g/dL — ABNORMAL LOW (ref 3.5–5.0)
Alkaline Phosphatase: 130 U/L — ABNORMAL HIGH (ref 38–126)
Anion gap: 11 (ref 5–15)
BUN: 23 mg/dL (ref 8–23)
CO2: 24 mmol/L (ref 22–32)
Calcium: 8.5 mg/dL — ABNORMAL LOW (ref 8.9–10.3)
Chloride: 96 mmol/L — ABNORMAL LOW (ref 98–111)
Creatinine, Ser: 0.81 mg/dL (ref 0.44–1.00)
GFR, Estimated: >60 mL/min (ref >60)
Glucose, Bld: 117 mg/dL — ABNORMAL HIGH (ref 70–99)
Potassium: 4.5 mmol/L (ref 3.5–5.1)
Sodium: 131 mmol/L — ABNORMAL LOW (ref 135–145)
Total Bilirubin: 2.2 mg/dL — ABNORMAL HIGH (ref 0.3–1.2)
Total Protein: 5.6 g/dL — ABNORMAL LOW (ref 6.5–8.1)

## 2023-02-18 MED ORDER — BACLOFEN 10 MG PO TABS
5.0000 mg | ORAL_TABLET | Freq: Two times a day (BID) | ORAL | Status: DC
  Administered 2023-02-18 (×2): 5 mg via ORAL
  Filled 2023-02-18 (×2): qty 1

## 2023-02-18 MED ORDER — HALOPERIDOL LACTATE 5 MG/ML IJ SOLN
5.0000 mg | Freq: Four times a day (QID) | INTRAMUSCULAR | Status: DC | PRN
  Administered 2023-02-18 – 2023-02-19 (×2): 5 mg via INTRAVENOUS
  Filled 2023-02-18 (×2): qty 1

## 2023-02-18 MED ORDER — GABAPENTIN 100 MG PO CAPS: 100.0000 mg | ORAL_CAPSULE | Freq: Every day | ORAL | Status: DC

## 2023-02-18 MED ORDER — ACETAMINOPHEN 500 MG PO TABS
1000.0000 mg | ORAL_TABLET | Freq: Three times a day (TID) | ORAL | Status: DC
  Administered 2023-02-18 – 2023-02-19 (×3): 1000 mg via ORAL
  Filled 2023-02-18 (×3): qty 2

## 2023-02-18 MED ORDER — OXYCODONE HCL 5 MG PO TABS
2.5000 mg | ORAL_TABLET | Freq: Three times a day (TID) | ORAL | Status: DC
  Administered 2023-02-18 (×2): 2.5 mg via ORAL
  Filled 2023-02-18 (×2): qty 1

## 2023-02-18 MED ORDER — ACETAMINOPHEN 325 MG PO TABS
650.0000 mg | ORAL_TABLET | Freq: Four times a day (QID) | ORAL | Status: DC | PRN
  Administered 2023-02-18: 650 mg via ORAL
  Filled 2023-02-18: qty 2

## 2023-02-18 NOTE — Progress Notes (Signed)
[facility]  Subjective  -says she hurts all over.  Finally had a bowel movement.   Objective (!) 175/77 69 (!) 97.3 F (36.3 C) (Oral) 17 93%  Intake/Output Summary (Last 24 hours) at 02/18/2023 0833 Last data filed at 02/17/2023 1244 Gross per 24 hour  Intake --  Output 500 ml  Net -500 ml   Abdomen soft Bilateral groins without hematoma Left DP palpable Right DP with brisk Doppler signal  Laboratory Lab Results: Recent Labs    02/17/23 1100 02/18/23 0412  WBC 21.1* 16.2*  HGB 14.9 13.8  HCT 45.3 41.9  PLT 107* 123*   BMET Recent Labs    02/17/23 1100 02/18/23 0412  NA 130* 131*  K 4.3 4.5  CL 94* 96*  CO2 23 24  GLUCOSE 121* 117*  BUN 19 23  CREATININE 0.80 0.81  CALCIUM 8.5* 8.5*    COAG Lab Results  Component Value Date   INR 1.4 (H) 03/11/2023   INR 1.3 (H) 02/13/2023   INR 1.9 (H) 02/06/2021   No results found for: "PTT"  Assessment/Planning:  Status post EVAR on Friday for symptomatic 7.8 cm AAA.  Continues to state she hurts all over.  Overall looks good from our standpoint.  Both groins look good and she has palpable femoral pulses.  Left DP palpable and right DP brisk by Doppler.  Apparently now DNR/DNI and considering hospice.  Cephus Shelling 02/18/2023 8:33 AM --

## 2023-02-18 NOTE — Progress Notes (Signed)
[facility] night cross cover note:  That this patient has sustained a fall. Staff found the patient sitting on the floor, conscious, with the bed alarm going off. Similar to day shift, she remains confused, now A&O x 1. Currently she is oriented to self, correctly able to identify her full name and date of birth. On day shift, she was noted to be confused A&O to person and place. RN discussed case with patient's son who conveyed that the above degree of confusion is on par with the patient's confusion over the last several weeks. Patient is currently without any additional complaints, including no acute arthralgias, myalgias, or headache.  Fall precautions in place.    Newton Pigg, DO Hospitalist

## 2023-02-18 NOTE — Progress Notes (Addendum)
PROGRESS NOTE    [name]  GMW:[id number] DOB: February 23, 1926 DOA: 02/25/2023 PCP: [name], MD   Brief Narrative:  89 y.o. female with medical history significant of abdominal aortic aneurysm (previously followed with Dr. Florene Route surgery: In 2022, aneurysm size was 6.2, patient had declined surgical workup and was no longer following up with vascular surgery), persistent A-fib on Pradaxa, complete heart block status post pacemaker, moderate CAD without obstructive lesions, COPD, aortic insufficiency, type a dissection of descending thoracic aorta, hypertension, hyperlipidemia presented with worsening abdominal pain. She had presented to the ED with abdominal and back pain few days ago. CT showed increased size of abdominal aortic aneurysm up to 7.8 cm without rupture. Patient declined any intervention. She also had evidence of UTI and was discharged on oral Keflex and pain meds. Patient subsequently has changed her mind and wanted to speak to vascular surgery for possible surgical intervention.  On presentation, vascular surgery was consulted.  She underwent EVAR by vascular surgery on 03/12/2023.  Subsequently, she developed possible shingles rash around right neck and has been started on Valtrex.  Palliative care has been involved as well.  Assessment & Plan:   Enlarging AAA presenting with worsening abdominal pain -previously followed with Dr. Florene Route surgery: In 2022, aneurysm size was 6.2, patient had declined surgical workup and was no longer following up with vascular surgery -Recent CT had shown increased size of abdominal aortic aneurysm up to 7.8 cm without rupture.  Patient declined any intervention. - Patient subsequently changed her mind and now wants surgical intervention. -Repeat CT angio on presentation showed increased size of infrarenal abdominal arctic aneurysm measuring 7.8 x 7.8 cm.   -underwent EVAR by vascular surgery on 02/11/2023.  Vascular surgery has  cleared the patient for discharge with outpatient follow-up.  Chronic diastolic heart failure -Compensated.  Strict input and output.  Daily weights.  Fluid restriction.  Continue Lasix, Imdur and metoprolol. -Echo showed EF of 60 to 65% with moderate to severe aortic valve stenosis.  Doubt that she will be a candidate for any surgical intervention.  Follow further palliative care discussions.  Possible shingles rash around the right neck -Patient developed painful blistering rash and has been started on Valtrex.  Continue Valtrex for a week.  Continue isolation.  Continue pain management.  Gabapentin was discontinued on 02/16/2023 because of patient being very sleepy.  Low-dose of gabapentin resumed on 02/17/2023: This can be uptitrated as necessary. -Still in significant pain and family does not think that patient is ready to come home yet.  Continue current pain management.  Leukocytosis -WBCs improving to 16.2 today.  Monitor.  Thrombocytopenia -Questionable cause.  Monitor.  Hyponatremia -Mild.  Monitor.  Hypertension -Monitor blood pressure.  Continue Cardizem, Imdur, Lasix and metoprolol.   Persistent A-fib -Pradaxa 02/15/2023 onwards continue Cardizem and monitor well.  Outpatient follow-up with cardiology   History of complete heart block status post pacemaker -Outpatient follow-up with cardiology   Moderate CAD without obstructing lesions -Stable.  Outpatient follow-up with cardiology   Hyperlipidemia -Continue statin   Recent diagnosis of UTI -Had E. coli growing in recent urine cultures from 02/09/2023.  Completed a course of Keflex.  Goals of care -Palliative care following.  Overall prognosis is guarded to poor.  Family is contemplating home with hospice but have not made a decision yet.     DVT prophylaxis: SCDs Code Status: DNR Family Communication: Son on 02/17/2023 on phone  disposition Plan: Status is: Inpatient Remains inpatient appropriate because: Of  severity of illness  Consultants: Vascular surgery/palliative care  Procedures: As above Antimicrobials:  Anti-infectives (From admission, onward)    Start     Dose/Rate Route Frequency Ordered Stop   02/16/23 1000  valACYclovir (VALTREX) tablet 1,000 mg        1,000 mg Oral 2 times daily 02/16/23 0902     02/16/23 0400  acyclovir (ZOVIRAX) tablet 800 mg  Status:  Discontinued        800 mg Oral 5 times daily 02/16/23 0254 02/16/23 0901   02/25/2023 2200  cephALEXin (KEFLEX) capsule 500 mg  Status:  Discontinued        500 mg Oral 2 times daily 02/19/2023 1510 02/16/23 1043        Subjective: Patient seen and examined at bedside.  Poor historian.  No seizures, agitation, vomiting reported.  Extremely sleepy this morning as per nursing staff but also intermittently complaining of severe pain. Objective: Vitals:   02/17/23 1244 02/17/23 1754 02/17/23 2007 02/18/23 0411  BP: 123/61 129/89 119/70 137/76  Pulse: 70 71 71   Resp: 16  (!) 24 15  Temp: (!) 97.3 F (36.3 C) (!) 97.3 F (36.3 C) (!) 97.4 F (36.3 C) (!) 97.5 F (36.4 C)  TempSrc: Oral Oral Oral Oral  SpO2: 93% 97% 96%   Weight:      Height:        Intake/Output Summary (Last 24 hours) at 02/18/2023 0758 Last data filed at 02/17/2023 1244 Gross per 24 hour  Intake --  Output 500 ml  Net -500 ml   Filed Weights   03/10/2023 1215 02-25-2023 0500 02/17/23 0732  Weight: 53.1 kg 54.2 kg 57.7 kg    Examination:  General: Remains on room air.  No acute distress.  Chronically ill and deconditioned looking.  Elderly female lying in bed. ENT/neck: No palpable thyromegaly or JVD elevation noted  Respiratory: Decreased breath sounds at bases bilaterally with some crackles and intermittent tachypnea  CVS: S1 and S2 heard; rate controlled currently  abdominal: Soft, slightly distended; nontender; no organomegaly, bowel sounds normally heard  extremities: Mild lower extremity edema present; no clubbing CNS: Wakes up slightly;  extremely slow to respond.  Very poor historian.  No focal deficits noted  lymph: No obvious lymph node skin: Vesicular rashes noted over the right side of the neck psych: Extremely flat affect.  Currently not agitated.   Musculoskeletal: No obvious joint swelling/tenderness  Data Reviewed: I have personally reviewed following labs and imaging studies  CBC: Recent Labs  Lab 02/25/2023 0415 2023/02/25 1812 02/15/23 0354 02/16/23 0340 02/17/23 1100 02/18/23 0412  WBC 14.4* 13.2* 14.9* 22.7* 21.1* 16.2*  NEUTROABS 11.5*  --  13.6* 19.9* 18.4* 13.8*  HGB 15.3* 13.5 14.3 14.1 14.9 13.8  HCT 45.9 41.1 42.9 42.5 45.3 41.9  MCV 95.0 95.6 95.1 92.6 95.6 91.9  PLT 155 122* 99* 141* 107* 123*   Basic Metabolic Panel: Recent Labs  Lab February 25, 2023 0415 02-25-2023 1812 02/15/23 0354 02/16/23 0340 02/17/23 1100 02/18/23 0412  NA 134* 135 135 132* 130* 131*  K 4.0 4.0 4.4 4.2 4.3 4.5  CL 94* 99 98 96* 94* 96*  CO2 26 24 21* 23 23 24   GLUCOSE 107* 163* 138* 129* 121* 117*  BUN 20 19 21 20 19 23   CREATININE 1.00 0.93 0.90 0.85 0.80 0.81  CALCIUM 8.6* 7.9* 8.1* 8.6* 8.5* 8.5*  MG 2.5* 2.2 2.2 2.3 2.4  --    GFR: Estimated Creatinine Clearance: 32.9 mL/min (by  C-G formula based on SCr of 0.81 mg/dL). Liver Function Tests: Recent Labs  Lab 03/12/2023 1241 02/13/23 0449 02/18/23 0412  AST 36 28 31  ALT 43 34 27  ALKPHOS 137* 125 130*  BILITOT 1.5* 1.9* 2.2*  PROT 5.7* 5.6* 5.6*  ALBUMIN 3.2* 3.0* 2.7*   No results for input(s): "LIPASE", "AMYLASE" in the last 168 hours.  No results for input(s): "AMMONIA" in the last 168 hours. Coagulation Profile: Recent Labs  Lab 02/13/23 0449 02/11/2023 1812  INR 1.3* 1.4*   Cardiac Enzymes: No results for input(s): "CKTOTAL", "CKMB", "CKMBINDEX", "TROPONINI" in the last 168 hours. BNP (last 3 results) No results for input(s): "PROBNP" in the last 8760 hours. HbA1C: No results for input(s): "HGBA1C" in the last 72 hours. CBG: No results for  input(s): "GLUCAP" in the last 168 hours. Lipid Profile: No results for input(s): "CHOL", "HDL", "LDLCALC", "TRIG", "CHOLHDL", "LDLDIRECT" in the last 72 hours. Thyroid Function Tests: No results for input(s): "TSH", "T4TOTAL", "FREET4", "T3FREE", "THYROIDAB" in the last 72 hours. Anemia Panel: No results for input(s): "VITAMINB12", "FOLATE", "FERRITIN", "TIBC", "IRON", "RETICCTPCT" in the last 72 hours. Sepsis Labs: No results for input(s): "PROCALCITON", "LATICACIDVEN" in the last 168 hours.  Recent Results (from the past 240 hour(s))  Urine Culture     Status: Abnormal   Collection Time: 02/09/23 12:41 AM   Specimen: Urine, Random  Result Value Ref Range Status   Specimen Description URINE, RANDOM  Final   Special Requests   Final    URINE, CLEAN CATCH Performed at [facility], [address] [place], [place] [address]    Culture >=100,000 COLONIES/mL ESCHERICHIA COLI (A)  Final   Report Status 02/11/2023 FINAL  Final   Organism ID, Bacteria ESCHERICHIA COLI (A)  Final      Susceptibility   Escherichia coli - MIC*    AMPICILLIN <=2 SENSITIVE Sensitive     CEFAZOLIN <=4 SENSITIVE Sensitive     CEFEPIME <=0.12 SENSITIVE Sensitive     CEFTRIAXONE <=0.25 SENSITIVE Sensitive     CIPROFLOXACIN <=0.25 SENSITIVE Sensitive     GENTAMICIN <=1 SENSITIVE Sensitive     IMIPENEM <=0.25 SENSITIVE Sensitive     NITROFURANTOIN <=16 SENSITIVE Sensitive     TRIMETH/SULFA <=20 SENSITIVE Sensitive     AMPICILLIN/SULBACTAM <=2 SENSITIVE Sensitive     PIP/TAZO <=4 SENSITIVE Sensitive     * >=100,000 COLONIES/mL ESCHERICHIA COLI  Surgical pcr screen     Status: None   Collection Time: 02/19/2023  7:26 AM   Specimen: Nasal Mucosa; Nasal Swab  Result Value Ref Range Status   MRSA, PCR NEGATIVE NEGATIVE Final   Staphylococcus aureus NEGATIVE NEGATIVE Final    Comment: (NOTE) The Xpert SA Assay (FDA approved for NASAL specimens in patients 34 years of age and older), is one component of a  comprehensive surveillance program. It is not intended to diagnose infection nor to guide or monitor treatment. Performed at [facility], [address], [place], [place] [address]          Radiology Studies: No results found.      Scheduled Meds:  celecoxib  100 mg Oral BID   dabigatran  75 mg Oral Q12H   diltiazem  120 mg Oral QHS   docusate sodium  100 mg Oral Daily   furosemide  40 mg Oral Daily   gabapentin  100 mg Oral BID   isosorbide mononitrate  30 mg Oral Daily   lidocaine  1 patch Transdermal  Q24H   magnesium hydroxide  30 mL Oral Daily   metoprolol succinate  25 mg Oral Daily   polyethylene glycol  17 g Oral Daily   rosuvastatin  5 mg Oral QHS   valACYclovir  1,000 mg Oral BID   Continuous Infusions:  sodium chloride     magnesium sulfate bolus IVPB            Glade Lloyd, MD [facility] 02/18/2023, 7:58 AM

## 2023-02-18 NOTE — Progress Notes (Signed)
Occupational Therapy Treatment Patient Details Name: [name] MRN: [id number] DOB: [birth year] Today's Date: 02/18/2023   History of present illness 89 yo female presents to [facility] on Feb 22, 2023 with worsening abdominal pain due to known AAA. S/p abdominal aortogram with iliac arteriogram including catheter selection of aorta, aortobiliac stent graft for repair of AAA via bilat femoral access on 10/4. PMH includes GERD, known AAA, CAD, COPD, CVA, cancer, afib, HTN, pacemaker, cardiac cath.   OT comments  Pt with slow progression toward established OT goals. Needing significant cues to attend to tasks and for safety/sequencing during mobility. Performing oral care seated in recliner and pt observed with BUE tremor that seemed to be exacerbated by fatigue; needing min A for oral care this session. Performing BUE AROM/strengthening to optimize strength and reduce tremor; needing constant hands on guidance and cues. Performing SPT back to bed with min A secondary to knee buckling in standing. Continue to recommend home with HHOT.       If plan is discharge home, recommend the following:  A little help with walking and/or transfers;Assistance with cooking/housework;Assist for transportation;Help with stairs or ramp for entrance;Supervision due to cognitive status;A lot of help with bathing/dressing/bathroom   Equipment Recommendations  BSC/3in1    Recommendations for Other Services      Precautions / Restrictions Precautions Precautions: Fall Restrictions Weight Bearing Restrictions: No       Mobility Bed Mobility Overal bed mobility: Needs Assistance Bed Mobility: Sit to Supine       Sit to supine: Mod assist   General bed mobility comments: to bring BLE into bed. Multimodal cues throughout    Transfers Overall transfer level: Needs assistance Equipment used: Rolling walker (2 wheels) Transfers: Sit to/from Stand, Bed to chair/wheelchair/BSC Sit to Stand: Min assist     Step pivot  transfers: Min assist     General transfer comment: multimodal cues for hand placement; mod knee buckling on rise. Light asisst throughout for strength, balance, and cognition     Balance Overall balance assessment: Needs assistance Sitting-balance support: No upper extremity supported, Feet supported Sitting balance-Leahy Scale: Fair     Standing balance support: Bilateral upper extremity supported, During functional activity, Reliant on assistive device for balance Standing balance-Leahy Scale: Poor Standing balance comment: Pt with knees buckling intermittently requiring assist to prevent fall                           ADL either performed or assessed with clinical judgement   ADL Overall ADL's : Needs assistance/impaired     Grooming: Oral care;Minimal assistance;Sitting Grooming Details (indicate cue type and reason): assist to apply toothbrush and secondary to BUE tremor with fatigue while brushing dentures                 Toilet Transfer: Minimal assistance;Rolling walker (2 wheels);Stand-pivot Statistician Details (indicate cue type and reason): Min A for rise; multimodal cues for hand placement         Functional mobility during ADLs: Minimal assistance;Rolling walker (2 wheels)      Extremity/Trunk Assessment Upper Extremity Assessment Upper Extremity Assessment: Generalized weakness (BUE tremor noted that is more significant with fatigue)   Lower Extremity Assessment Lower Extremity Assessment: Defer to PT evaluation        Vision   Additional Comments: 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 Pt needing cues for hand palcement, sequencing and safety during mobility. Needing full sset up for oral care and cues for attention to task today.        Exercises Exercises: Other exercises Other Exercises Other Exercises: BUE tremor noted that was not documented on OT eval. Pt engaged in functional reacing x3 with multimodal cues secondary to low vision and poor attention. BUE shoulder flexion and resisted row x10 ea with BUE and might hand over hand assist to optimize participation with shoulder flexion; tremors throughout.    Shoulder Instructions       General Comments      Pertinent Vitals/ Pain       Pain Assessment Pain Assessment: Faces Faces Pain Scale: Hurts little more Pain Location: from rash and groin sites Pain Descriptors / Indicators: Discomfort, Grimacing Pain Intervention(s): Limited activity within patient's tolerance, Monitored during session  Home Living                                          Prior Functioning/Environment              Frequency  Min 1X/week        Progress Toward Goals  OT Goals(current goals can now be found in the care plan section)  Progress towards OT goals: Progressing toward goals  Acute Rehab OT Goals Patient Stated Goal: pt unable; agreeable to oral care and transfers OT Goal Formulation: With patient Time For Goal Achievement: 03/02/23 Potential to Achieve Goals: Good ADL Goals Pt Will Perform Upper Body Dressing: Independently;sitting Pt Will Perform Lower Body Dressing: Independently;sit to/from stand;sitting/lateral leans Pt Will Transfer to Toilet: Independently;ambulating;regular height toilet Pt/caregiver will Perform Home Exercise Program: Both right and left upper  extremity;With written HEP provided Additional ADL Goal #1: pt will be able to stand x10 min in order to improve activity tolerance for ADLs  Plan      Co-evaluation                 AM-PAC OT "6 Clicks" Daily Activity     Outcome Measure   Help from another person eating meals?: A Little Help from another person taking care of personal grooming?: A Little Help from another person toileting, which includes using toliet, bedpan, or urinal?: A Little Help from another person bathing (including washing, rinsing, drying)?: A Lot Help from another person to put on and taking off regular upper body clothing?: A Little Help from another person to put on and taking off regular lower body clothing?: A Lot 6 Click Score: 16    End of Session Equipment Utilized During Treatment: Rolling walker (2 wheels)  OT Visit Diagnosis: Unsteadiness on feet (R26.81);Other abnormalities of gait and mobility (R26.89);Muscle weakness (generalized) (M62.81)   Activity Tolerance Patient tolerated treatment well   Patient Left in bed;with  call bell/phone within reach;with bed alarm set;with family/visitor present   Nurse Communication Mobility status        Time: 7829-5621 OT Time Calculation (min): 34 min  Charges: OT General Charges $OT Visit: 1 Visit OT Treatments $Self Care/Home Management : 23-37 mins  Tyler Deis, OTR/L [facility] Office: [phone]'   [name] 02/18/2023, 5:07 PM

## 2023-02-18 NOTE — Progress Notes (Signed)
TR[facility] night cross cover note:  I was notified by RN that this patient is confused, agitated, attempting to get out of bed even after fall earlier in the shift, with these behaviors refractory to attempts at verbal redirection.  In the setting of associated interference with ongoing medical treatment posing potential harm to themself, I have placed orders for soft bilateral wrist restraints as well as prn iv haldol.    Newton Pigg, DO Hospitalist

## 2023-02-18 NOTE — Progress Notes (Signed)
Palliative Medicine Inpatient Follow Up Note   HPI: 89 y.o. female  with past medical history of  abdominal aortic aneurysm (previously followed with Dr. Florene Route surgery (In 2022, aneurysm size was 6.2, patient had declined surgical workup and was no longer following up with vascular surgery), persistent A-fib on Pradaxa, complete heart block status post pacemaker, moderate CAD without obstructive lesions, COPD, aortic insufficiency, type a dissection of descending thoracic aorta, hypertension, hyperlipidemia presented with worsening abdominal pain. She had presented to the ED with abdominal and back pain few days ago. CT showed increased size of abdominal aortic aneurysm up to 7.8 cm without rupture. Patient declined any intervention. She also had evidence of UTI and was discharged on oral Keflex and pain meds. Patient subsequently has changed her mind and wanted to speak to vascular surgery for possible surgical intervention.  On presentation, vascular surgery was consulted.     Palliative medicine was consulted for GOC conversations.  Today's Discussion 02/18/2023  *Please note that this is a verbal dictation therefore any spelling or grammatical errors are due to the "[employer] One" system interpretation.  Chart reviewed inclusive of vital signs, progress notes, laboratory results, and diagnostic images.   I spoke with Wynell's RN, Linh this morning. She shares that patients pain has been poorly controlled. She shares that Stephaie did have a large bowel movement which did appear to give her relief.   ON assessment this morning, Docia was exemplifying signs of opoid induced hyperalgesia likely as a result of the morphine she had been getting fairly regularly.   Discussed a pain management plan with the staff inclusive of oral tylenol 1g TID, oxycodone 2.5mg  TID, decreased gabapentin to night dosing only, Lidoderm patch, on a trial of NSAIDs which have an end date, and baclofen for  spasms.   I met with patients oldest son at bedside and reviewed the above. He feels she looks much improved from a pain perspective as compared to yesterday. He shares that he and his family would like to give her a few more days to see if she could improve. They at this point are not ready for hospice. We discussed the plan to continue along with pain management.   Questions and concerns addressed/Palliative Support Provided.    Objective Assessment: Vital Signs Vitals:   02/18/23 0905 02/18/23 1130  BP: (!) 159/84 123/85  Pulse:  70  Resp:  18  Temp:  98.1 F (36.7 C)  SpO2:  92%   No intake or output data in the 24 hours ending 02/18/23 1259  Last Weight  Most recent update: 02/17/2023  8:59 AM    Weight  57.7 kg (127 lb 3.3 oz)            Gen:  Elderly Caucasian F in NAD HEENT: moist mucous membranes CV: Regular rate and rhythm  PULM: On RA, breathing is even and nonlabored ABD: soft/nontender  EXT: No edema  Neuro: Alert and oriented x3   SUMMARY OF RECOMMENDATIONS   DNAR/DNI  Symptoms as Below  Plan to allow a few more days to see if patient can improve   [facility] and have a better understanding of services, at this time they are not ready to place [facility] on hospice care  Goals at this time are to optimize pain  Ongoing PMT support  Symptoms: Acute Pain, Generalized: - Tylenol 1g PO TID - Stop Morphine - concern of hyperalgesia - Celebrex 100mg  PO BID x5  days (Started 10/8 - ) - Oxycodone 2.5mg  PO TID - Oxycodone 5mg  PO Q4H PRN - Gabapentin 100mg  PO QHS - Lidoderm Patch to back - Baclofen 5mg  PO BID  Itching: - Continue hydrocortisone % TID PRN  Bowel Ppx: - Miralx 17g PO Daily - LBM 10/8  Time Spent: 63 ______________________________________________________________________________________ Lamarr Lulas [place] Palliative Medicine Team Team Cell Phone: [phone] Please utilize secure chat with  additional questions, if there is no response within 30 minutes please call the above phone number  Palliative Medicine Team providers are available by phone from 7am to 7pm daily and can be reached through the team cell phone.  Should this patient require assistance outside of these hours, please call the patient's attending physician.

## 2023-02-18 NOTE — Progress Notes (Signed)
Physical Therapy Treatment Patient Details Name: [name] MRN: [id number] DOB: 06/03/1925 Today's Date: 02/18/2023   History of Present Illness 89 yo female presents to [facility] on March 13, 2023 with worsening abdominal pain due to known AAA. S/p abdominal aortogram with iliac arteriogram including catheter selection of aorta, aortobiliac stent graft for repair of AAA via bilat femoral access on 10/4. PMH includes GERD, known AAA, CAD, COPD, CVA, cancer, afib, HTN, pacemaker, cardiac cath.    PT Comments  Pt received in supine and agreeable to session. Pt able to perform bed mobility and standing with CGA. Pt noted to have BUE tremors causing increased instability in sitting and standing. Pt able to take a few steps, however BLE buckled and pt required max A to prevent fall. Pt able to step back to bed, however pt's BLE buckle again upon second stand. Pt able to stand and pivot to recliner. Pt stating "just take me" and "just put me in a hole" during session, RN notified. Pt requires increased cues for safety and sequencing during session, question due to [facility] vs impaired cognition. Pt continues to benefit from PT services to progress toward functional mobility goals.     If plan is discharge home, recommend the following: A little help with walking and/or transfers;A little help with bathing/dressing/bathroom   Can travel by private vehicle        Equipment Recommendations  None recommended by PT    Recommendations for Other Services       Precautions / Restrictions Precautions Precautions: Fall Restrictions Weight Bearing Restrictions: No     Mobility  Bed Mobility Overal bed mobility: Needs Assistance Bed Mobility: Supine to Sit     Supine to sit: Supervision, Used rails, HOB elevated     General bed mobility comments: increased time and use of bedrails    Transfers Overall transfer level: Needs assistance Equipment used: Rolling walker (2 wheels) Transfers: Sit to/from Stand, Bed  to chair/wheelchair/BSC Sit to Stand: Contact guard assist   Step pivot transfers: Contact guard assist       General transfer comment: Cues for hand placement and CGA for safety    Ambulation/Gait Ambulation/Gait assistance: Contact guard assist, Max assist Gait Distance (Feet): 3 Feet Assistive device: Rolling walker (2 wheels) Gait Pattern/deviations: Step-through pattern, Decreased stride length, Trunk flexed       General Gait Details: Pt able to take a few steps with CGA, however knees buckled requiring max A to correct and prevent a fall. Pt able to step back to bed and further gait deferred for safety       Balance Overall balance assessment: Needs assistance Sitting-balance support: No upper extremity supported, Feet supported Sitting balance-Leahy Scale: Fair Sitting balance - Comments: sitting EOB. Pt noted to have BUE tremors causing increased instability   Standing balance support: Bilateral upper extremity supported, During functional activity, Reliant on assistive device for balance Standing balance-Leahy Scale: Poor Standing balance comment: Pt with knees buckling intermittently requiring assist to prevent fall                            Cognition Arousal: Alert Behavior During Therapy: WFL for tasks assessed/performed Overall Cognitive Status: No family/caregiver present to determine baseline cognitive functioning  Exercises      General Comments        Pertinent Vitals/Pain Pain Assessment Pain Assessment: Faces Faces Pain Scale: Hurts little more Pain Location: from rash and groin sites Pain Descriptors / Indicators: Discomfort, Grimacing Pain Intervention(s): Monitored during session, Repositioned     PT Goals (current goals can now be found in the care plan section) Acute Rehab PT Goals Patient Stated Goal: home PT Goal Formulation: With patient Time For Goal  Achievement: 03/01/23 Progress towards PT goals: Progressing toward goals    Frequency    Min 1X/week       AM-PAC PT "6 Clicks" Mobility   Outcome Measure  Help needed turning from your back to your side while in a flat bed without using bedrails?: A Little Help needed moving from lying on your back to sitting on the side of a flat bed without using bedrails?: A Little Help needed moving to and from a bed to a chair (including a wheelchair)?: A Little Help needed standing up from a chair using your arms (e.g., wheelchair or bedside chair)?: A Little Help needed to walk in hospital room?: A Lot Help needed climbing 3-5 steps with a railing? : Total 6 Click Score: 15    End of Session Equipment Utilized During Treatment: Gait belt Activity Tolerance: Patient limited by fatigue Patient left: in chair;with chair alarm set;with family/visitor present;with call bell/phone within reach Nurse Communication: Mobility status PT Visit Diagnosis: Other abnormalities of gait and mobility (R26.89);Muscle weakness (generalized) (M62.81)     Time: 5409-8119 PT Time Calculation (min) (ACUTE ONLY): 18 min  Charges:    $Therapeutic Activity: 8-22 mins PT General Charges $$ ACUTE PT VISIT: 1 Visit                     Johny Shock, PTA Acute Rehabilitation Services Secure Chat Preferred  [phone]    Johny Shock 02/18/2023, 3:56 PM

## 2023-02-18 NOTE — Progress Notes (Addendum)
Bed alarm going off . Upon entering the room, she is sitting on her buttocks facing the bed directly beside the bed. Upon full assessment, no abnormalities noted. Strength is weak all extremeties, but not a new assessment . She has tremors BLE which is also not new. Denies pain .Alert to person and birthday only.Son reports that this confusion has been her new normal for the past several weeks. Follows commands. VS WNL . 2 Assist w/ Earvin Hansen, RN back to bed w/ her walker. She is very weak . Bed alarm engaged . Bed in lowest position.  Notified Dr Myra Gianotti, and notified family, Threasa Heads, son of the incident.  No new orders at this time. Continuing to monitor.

## 2023-02-19 ENCOUNTER — Encounter (HOSPITAL_COMMUNITY): Payer: Self-pay | Admitting: Internal Medicine

## 2023-02-19 DIAGNOSIS — B029 Zoster without complications: Secondary | ICD-10-CM | POA: Diagnosis not present

## 2023-02-19 DIAGNOSIS — I4811 Longstanding persistent atrial fibrillation: Secondary | ICD-10-CM

## 2023-02-19 DIAGNOSIS — R52 Pain, unspecified: Secondary | ICD-10-CM | POA: Diagnosis not present

## 2023-02-19 DIAGNOSIS — Z7189 Other specified counseling: Secondary | ICD-10-CM | POA: Diagnosis not present

## 2023-02-19 DIAGNOSIS — F05 Delirium due to known physiological condition: Secondary | ICD-10-CM

## 2023-02-19 DIAGNOSIS — I714 Abdominal aortic aneurysm, without rupture, unspecified: Secondary | ICD-10-CM | POA: Diagnosis not present

## 2023-02-19 DIAGNOSIS — E871 Hypo-osmolality and hyponatremia: Secondary | ICD-10-CM

## 2023-02-19 DIAGNOSIS — Z515 Encounter for palliative care: Secondary | ICD-10-CM | POA: Diagnosis not present

## 2023-02-19 DIAGNOSIS — Z7901 Long term (current) use of anticoagulants: Secondary | ICD-10-CM | POA: Diagnosis not present

## 2023-02-19 DIAGNOSIS — I5032 Chronic diastolic (congestive) heart failure: Secondary | ICD-10-CM

## 2023-02-19 DIAGNOSIS — I10 Essential (primary) hypertension: Secondary | ICD-10-CM

## 2023-02-19 LAB — BASIC METABOLIC PANEL
Anion gap: 11 (ref 5–15)
BUN: 24 mg/dL — ABNORMAL HIGH (ref 8–23)
CO2: 21 mmol/L — ABNORMAL LOW (ref 22–32)
Calcium: 8.6 mg/dL — ABNORMAL LOW (ref 8.9–10.3)
Chloride: 100 mmol/L (ref 98–111)
Creatinine, Ser: 0.79 mg/dL (ref 0.44–1.00)
GFR, Estimated: >60 mL/min (ref >60)
Glucose, Bld: 137 mg/dL — ABNORMAL HIGH (ref 70–99)
Potassium: 4.4 mmol/L (ref 3.5–5.1)
Sodium: 132 mmol/L — ABNORMAL LOW (ref 135–145)

## 2023-02-19 LAB — CBC WITH DIFFERENTIAL/PLATELET
Abs Immature Granulocytes: 0.13 10*3/uL — ABNORMAL HIGH (ref 0.00–0.07)
Basophils Absolute: 0 10*3/uL (ref 0.0–0.1)
Basophils Relative: 0 %
Eosinophils Absolute: 0 10*3/uL (ref 0.0–0.5)
Eosinophils Relative: 0 %
HCT: 39.4 % (ref 36.0–46.0)
Hemoglobin: 13.2 g/dL (ref 12.0–15.0)
Immature Granulocytes: 1 %
Lymphocytes Relative: 4 %
Lymphs Abs: 0.6 10*3/uL — ABNORMAL LOW (ref 0.7–4.0)
MCH: 31.3 pg (ref 26.0–34.0)
MCHC: 33.5 g/dL (ref 30.0–36.0)
MCV: 93.4 fL (ref 80.0–100.0)
Monocytes Absolute: 1.3 10*3/uL — ABNORMAL HIGH (ref 0.1–1.0)
Monocytes Relative: 8 %
Neutro Abs: 14.5 10*3/uL — ABNORMAL HIGH (ref 1.7–7.7)
Neutrophils Relative %: 87 %
Platelets: 130 10*3/uL — ABNORMAL LOW (ref 150–400)
RBC: 4.22 MIL/uL (ref 3.87–5.11)
RDW: 15.3 % (ref 11.5–15.5)
WBC: 16.6 10*3/uL — ABNORMAL HIGH (ref 4.0–10.5)
nRBC: 0 % (ref 0.0–0.2)

## 2023-02-19 LAB — MAGNESIUM: Magnesium: 2.4 mg/dL (ref 1.7–2.4)

## 2023-02-19 MED ORDER — BACLOFEN 10 MG PO TABS
5.0000 mg | ORAL_TABLET | Freq: Two times a day (BID) | ORAL | Status: DC | PRN
  Administered 2023-02-19: 5 mg via ORAL
  Filled 2023-02-19: qty 1

## 2023-02-19 MED ORDER — SODIUM CHLORIDE 0.9% FLUSH
3.0000 mL | Freq: Two times a day (BID) | INTRAVENOUS | Status: DC
  Administered 2023-02-19: 3 mL via INTRAVENOUS

## 2023-02-19 MED ORDER — LORAZEPAM 2 MG/ML IJ SOLN
0.5000 mg | INTRAMUSCULAR | Status: DC | PRN
  Administered 2023-02-21: 1 mg via INTRAVENOUS
  Filled 2023-02-19 (×2): qty 1

## 2023-02-19 MED ORDER — DICLOFENAC SODIUM 1 % EX GEL
4.0000 g | Freq: Four times a day (QID) | CUTANEOUS | Status: DC
  Filled 2023-02-19: qty 100

## 2023-02-19 MED ORDER — ACETAMINOPHEN 325 MG PO TABS: 650.0000 mg | ORAL_TABLET | Freq: Four times a day (QID) | ORAL | Status: DC | PRN

## 2023-02-19 MED ORDER — OXYCODONE HCL 5 MG PO TABS
2.5000 mg | ORAL_TABLET | ORAL | Status: DC | PRN
  Administered 2023-02-19: 2.5 mg via ORAL
  Filled 2023-02-19 (×2): qty 1

## 2023-02-19 MED ORDER — FENTANYL 2500MCG IN NS 250ML (10MCG/ML) PREMIX INFUSION
0.0000 ug/h | INTRAVENOUS | Status: DC
  Administered 2023-02-19: 400 ug/h via INTRAVENOUS
  Administered 2023-02-19 – 2023-02-21 (×3): 100 ug/h via INTRAVENOUS
  Filled 2023-02-19 (×3): qty 250

## 2023-02-19 MED ORDER — OLANZAPINE 2.5 MG PO TABS
2.5000 mg | ORAL_TABLET | Freq: Two times a day (BID) | ORAL | Status: DC | PRN
  Filled 2023-02-19: qty 1

## 2023-02-19 MED ORDER — BIOTENE DRY MOUTH MT LIQD: 15.0000 mL | OROMUCOSAL | Status: DC | PRN

## 2023-02-19 MED ORDER — FENTANYL CITRATE PF 50 MCG/ML IJ SOSY
12.5000 ug | PREFILLED_SYRINGE | INTRAMUSCULAR | Status: DC | PRN
  Administered 2023-02-19: 12.5 ug via INTRAVENOUS
  Filled 2023-02-19: qty 1

## 2023-02-19 MED ORDER — ACETAMINOPHEN 650 MG RE SUPP: 650.0000 mg | Freq: Four times a day (QID) | RECTAL | Status: DC | PRN

## 2023-02-19 MED ORDER — LORAZEPAM 2 MG/ML IJ SOLN
1.0000 mg | Freq: Four times a day (QID) | INTRAMUSCULAR | Status: DC
  Administered 2023-02-19 – 2023-02-21 (×7): 1 mg via INTRAVENOUS
  Filled 2023-02-19 (×6): qty 1

## 2023-02-19 MED ORDER — FENTANYL BOLUS VIA INFUSION
50.0000 ug | INTRAVENOUS | Status: DC | PRN
  Administered 2023-02-21: 50 ug via INTRAVENOUS

## 2023-02-19 MED ORDER — POLYVINYL ALCOHOL 1.4 % OP SOLN: 1.0000 [drp] | Freq: Four times a day (QID) | OPHTHALMIC | Status: DC | PRN

## 2023-02-19 NOTE — Hospital Course (Signed)
HPI: [name] is a 89 y.o. female with medical history significant of abdominal aortic aneurysm (previously followed with Dr. Florene Route surgery: In 2022, aneurysm size was 6.2, patient had declined surgical workup and was no longer following up with vascular surgery), persistent A-fib on Pradaxa, complete heart block status post pacemaker, moderate CAD without obstructive lesions, COPD, aortic insufficiency, type a dissection of descending thoracic aorta, hypertension, hyperlipidemia presented with worsening abdominal pain.  She had presented to the ED with abdominal and back pain few days ago.  CT showed increased size of abdominal aortic aneurysm up to 7.8 cm without rupture.  Patient declined any intervention.  She also had evidence of UTI and was discharged on oral Keflex and pain meds.  Patient subsequently has changed her mind and wants to speak to vascular surgery for possible surgical intervention.  No chest pain, shortness of breath, fever, vomiting, diarrhea, dysuria, hematuria, loss of consciousness or seizures reported.   Hospital Course from 02/26/2023 thru 02-18-2023  On presentation, vascular surgery was consulted.  She underwent EVAR by vascular surgery on 02/28/2023.  Subsequently, she developed possible shingles rash around right neck and has been started on Valtrex.  Palliative care has been involved as well.   Significant Events: Admitted 03/09/2023 for abd pain 02-19-2023. Made comfort care by palliative care team. Fentanyl gtts started.  Significant Labs:   Significant Imaging Studies: Admission CTA Increased size of the infrarenal abdominal aortic aneurysm now measuring 7.8 x 7.8 cm, previously 7.8 x 7.6 cm. There is new hazy fat stranding about the aneurysm concerning for impending rupture.  Antibiotic Therapy: Anti-infectives (From admission, onward)    Start     Dose/Rate Route Frequency Ordered Stop   02/16/23 1000  valACYclovir (VALTREX) tablet 1,000 mg         1,000 mg Oral 2 times daily 02/16/23 0902 02/26/23 0959   02/16/23 0400  acyclovir (ZOVIRAX) tablet 800 mg  Status:  Discontinued        800 mg Oral 5 times daily 02/16/23 0254 02/16/23 0901   03/06/2023 2200  cephALEXin (KEFLEX) capsule 500 mg  Status:  Discontinued        500 mg Oral 2 times daily 02/22/2023 1510 02/16/23 1043       Procedures: 02/23/2023 Aortobiiliac stent graft for repair of abdominal aortic aneurysm (Gore Excluder)   Consultants: Vascular Surgery Palliative Care

## 2023-02-19 NOTE — Assessment & Plan Note (Signed)
Stable.  Remains on Lasix 40 mg daily, Cardizem CD 120 mg daily, Toprol-XL 25 mg daily 02-19-2023. Pt made comfort care. All meds not providing comfort to patient were stopped.

## 2023-02-19 NOTE — Progress Notes (Signed)
This chaplain responded to PMT NP-Michelle consult for EOL spiritual care. The Pt. is quietly resting. Family is at the bedside.  The chaplain carried the comfort tray provided by the unit into the Pt. Room as she introduced herself. The family accepted the extra layer of support. The chaplain understands the family is contacting community clergy.   The chaplain understands through storytelling the Pt. is independent and kind with the ability to give a handful of how to directions. The family is blessed with many stories. The most important was "she was ready to go live with God."   The chaplain offered F/U spiritual care as needed.  Chaplain Stephanie Acre [phone]

## 2023-02-19 NOTE — Assessment & Plan Note (Signed)
Mild.  Sodium 132.  No clinical significance.

## 2023-02-19 NOTE — Subjective & Objective (Addendum)
Patient seen and examined.  Patient son Lloyd Huger and son Freida Busman at the bedside.  Pt requiring IV robinul due to excessive secretions.  Remains on IV fentanyl Both sons state they do not want pt to suffer

## 2023-02-19 NOTE — Plan of Care (Signed)
  Problem: Education: Goal: Knowledge of General Education information will improve Description: Including pain rating scale, medication(s)/side effects and non-pharmacologic comfort measures Outcome: Not Progressing   Problem: Activity: Goal: Risk for activity intolerance will decrease Outcome: Not Progressing   

## 2023-02-19 NOTE — Progress Notes (Signed)
Notified Dr Arlean Hopping of confusion, agitation that is continuing to worsten over night. She continues to attempt to get out of the bed even after her fall at the start of this shift. Verbal redirection attempts are unsuccessful.  See new orders. Call light in reach. Bed in lowest position. Floor pads down and call bell in reach. Bed alarm is engaged.

## 2023-02-19 NOTE — Progress Notes (Signed)
[facility]  Subjective  -very confused.  States she hurts all over.   Objective (!) 165/111 78 97.7 F (36.5 C) (Axillary) 18 94%  Intake/Output Summary (Last 24 hours) at 02/19/2023 1417 Last data filed at 02/19/2023 0000 Gross per 24 hour  Intake 240 ml  Output --  Net 240 ml    Abdomen soft Bilateral groins without hematoma and palpable femoral pulses Both feet warm  Laboratory Lab Results: Recent Labs    02/18/23 0412 02/19/23 0406  WBC 16.2* 16.6*  HGB 13.8 13.2  HCT 41.9 39.4  PLT 123* 130*   BMET Recent Labs    02/18/23 0412 02/19/23 0406  NA 131* 132*  K 4.5 4.4  CL 96* 100  CO2 24 21*  GLUCOSE 117* 137*  BUN 23 24*  CREATININE 0.81 0.79  CALCIUM 8.5* 8.6*    COAG Lab Results  Component Value Date   INR 1.4 (H) 03/01/2023   INR 1.3 (H) 02/13/2023   INR 1.9 (H) 02/06/2021   No results found for: "PTT"  Assessment/Planning:  Status post EVAR for 7.8 cm symptomatic abdominal aortic aneurysm.  Her case went very well from a technical standpoint and we did this with her awake without general anesthesia using MAC to try and improve her post-op outcome.  Unfortunately she has had an ongoing clinical decline.  Family has elected comfort care.  Cephus Shelling 02/19/2023 2:17 PM --

## 2023-02-19 NOTE — Assessment & Plan Note (Signed)
Chronic.  Orders placed to deactivate defibrillator.

## 2023-02-19 NOTE — Assessment & Plan Note (Addendum)
Stable.  Remains on Pradaxa.  Remains on Cardizem CD to 120 mg daily.  02-19-2023. Pt made comfort care. All meds not providing comfort to patient were stopped.

## 2023-02-19 NOTE — Assessment & Plan Note (Signed)
Patient developed painful blistering rash on 02-16-2023 and had been started on Valtrex. Pt placed on airborne isolation.   Gabapentin was discontinued on 02/16/2023 because of patient being very sleepy.  Low-dose of gabapentin resumed on 02/17/2023  Palliative care consulted to help with pain management. 02-19-2023. Pt placed on fentanyl gtts for end-of-life care.

## 2023-02-19 NOTE — Assessment & Plan Note (Signed)
previously followed with Dr. Florene Route surgery: In 2022, aneurysm size was 6.2, patient had declined surgical workup and was no longer following up with vascular surgery -Recent CT had shown increased size of abdominal aortic aneurysm up to 7.8 cm without rupture.  Patient declined any intervention. - Patient subsequently changed her mind and now wants surgical intervention. -Repeat CT angio on presentation showed increased size of infrarenal abdominal arctic aneurysm measuring 7.8 x 7.8 cm.   -underwent EVAR by vascular surgery on Feb 22, 2023.  Vascular surgery has cleared the patient for discharge with outpatient follow-up.

## 2023-02-19 NOTE — Assessment & Plan Note (Signed)
Stable.  Euvolemic.  Remains on Toprol-XL 25 mg daily, Imdur 30 mg daily, Lasix 40 mg daily,  02-19-2023. Pt made comfort care. All meds not providing comfort to patient were stopped.

## 2023-02-19 NOTE — Progress Notes (Signed)
PROGRESS NOTE    [name]  AOZ:[id number] DOB: February 25, 1926 DOA: 2023/03/03 PCP: [name], MD  Subjective: Patient seen and examined.  Patient's son [name] at the bedside.  Events of last night noted.  Patient fell out of bed last night.  She was delirious.  Patient seen by palliative care.  They think that she has hyperalgesia from too many pain meds.  Her pain medication has been monitored and changed by palliative.  She still somewhat delirious.  Moaning out loud.  Does not recognize her son.  She still has a shingles rash that is not completely dried out yet on the right side of her neck.  She remains in isolation.   Hospital Course: HPI: [name] is a 89 y.o. female with medical history significant of abdominal aortic aneurysm (previously followed with Dr. Florene Route surgery: In 2022, aneurysm size was 6.2, patient had declined surgical workup and was no longer following up with vascular surgery), persistent A-fib on Pradaxa, complete heart block status post pacemaker, moderate CAD without obstructive lesions, COPD, aortic insufficiency, type a dissection of descending thoracic aorta, hypertension, hyperlipidemia presented with worsening abdominal pain.  She had presented to the ED with abdominal and back pain few days ago.  CT showed increased size of abdominal aortic aneurysm up to 7.8 cm without rupture.  Patient declined any intervention.  She also had evidence of UTI and was discharged on oral Keflex and pain meds.  Patient subsequently has changed her mind and wants to speak to vascular surgery for possible surgical intervention.  No chest pain, shortness of breath, fever, vomiting, diarrhea, dysuria, hematuria, loss of consciousness or seizures reported.   Hospital Course from 2023-03-03 thru 02-18-2023  On presentation, vascular surgery was consulted.  She underwent EVAR by vascular surgery on 03/05/2023.  Subsequently, she developed possible shingles rash around right  neck and has been started on Valtrex.  Palliative care has been involved as well.   Significant Events: Admitted 03-Mar-2023 for abd pain   Significant Labs:   Significant Imaging Studies: Admission CTA Increased size of the infrarenal abdominal aortic aneurysm now measuring 7.8 x 7.8 cm, previously 7.8 x 7.6 cm. There is new hazy fat stranding about the aneurysm concerning for impending rupture.  Antibiotic Therapy: Anti-infectives (From admission, onward)    Start     Dose/Rate Route Frequency Ordered Stop   02/16/23 1000  valACYclovir (VALTREX) tablet 1,000 mg        1,000 mg Oral 2 times daily 02/16/23 0902 02/26/23 0959   02/16/23 0400  acyclovir (ZOVIRAX) tablet 800 mg  Status:  Discontinued        800 mg Oral 5 times daily 02/16/23 0254 02/16/23 0901   03-03-23 2200  cephALEXin (KEFLEX) capsule 500 mg  Status:  Discontinued        500 mg Oral 2 times daily 03-03-23 1510 02/16/23 1043       Procedures: 03/02/2023 Aortobiiliac stent graft for repair of abdominal aortic aneurysm (Gore Excluder)   Consultants: Vascular Surgery Palliative Care    Assessment and Plan: * Abdominal aortic aneurysm (AAA) (HCC) previously followed with Dr. Florene Route surgery: In 2022, aneurysm size was 6.2, patient had declined surgical workup and was no longer following up with vascular surgery -Recent CT had shown increased size of abdominal aortic aneurysm up to 7.8 cm without rupture.  Patient declined any intervention. - Patient subsequently changed her mind and now wants surgical intervention. -Repeat CT angio on presentation showed increased size of infrarenal abdominal  arctic aneurysm measuring 7.8 x 7.8 cm.   -underwent EVAR by vascular surgery on 03/11/2023.  Vascular surgery has cleared the patient for discharge with outpatient follow-up.  Delirium due to multiple etiologies, acute, hyperactive Palliative care thinks the patient is having delirium from too many pain medications.   They are adjusting her pain medication.  Shingles rash Patient developed painful blistering rash on 02-16-2023 and had been started on Valtrex. Pt placed on airborne isolation.   Gabapentin was discontinued on 02/16/2023 because of patient being very sleepy.  Low-dose of gabapentin resumed on 02/17/2023  Palliative care consulted to help with pain management.  Hyponatremia Mild.  Sodium 132.  No clinical significance.  Chronic diastolic heart failure (HCC) Stable.  Euvolemic.  Remains on Toprol-XL 25 mg daily, Imdur 30 mg daily, Lasix 40 mg daily,   Chronic anticoagulation Remains on Pradaxa.  Pacemaker Chronic.  Longstanding persistent atrial fibrillation (HCC) Stable.  Remains on Pradaxa.  Remains on Cardizem CD to 120 mg daily.  HTN (hypertension) Stable.  Remains on Lasix 40 mg daily, Cardizem CD 120 mg daily, Toprol-XL 25 mg daily   DVT prophylaxis: SCD's Start: 02/11/2023 1500 SCDs Start: Feb 27, 2023 1509 dabigatran (PRADAXA) capsule 75 mg     Code Status: Limited: Do not attempt resuscitation (DNR) -DNR-LIMITED -Do Not Intubate/DNI  Family Communication: discussed with pt's son [name] at bedside Disposition Plan: return home Reason for continuing need for hospitalization: pt remains delirious. Pain medications being adjusted by Palliative Care.  Objective: Vitals:   02/18/23 1130 02/18/23 1721 02/18/23 2010 02/19/23 0740  BP: 123/85 (!) 146/73 135/70 (!) 165/111  Pulse: 70 97 78 78  Resp: 18 19 18 18   Temp: 98.1 F (36.7 C) (!) 96.8 F (36 C) 97.7 F (36.5 C) 97.7 F (36.5 C)  TempSrc: Oral Axillary Axillary Axillary  SpO2: 92% 93% 93% 94%  Weight:      Height:        Intake/Output Summary (Last 24 hours) at 02/19/2023 1158 Last data filed at 02/19/2023 0000 Gross per 24 hour  Intake 240 ml  Output --  Net 240 ml   Filed Weights   02-27-23 1215 02/15/2023 0500 02/17/23 0732  Weight: 53.1 kg 54.2 kg 57.7 kg    Examination:  Physical Exam Vitals and nursing  note reviewed.  Constitutional:      Comments: Elderly 89 year old female, chronically ill-appearing.  HENT:     Head: Normocephalic and atraumatic.  Cardiovascular:     Rate and Rhythm: Normal rate and regular rhythm.     Pulses: Normal pulses.  Pulmonary:     Effort: No respiratory distress.     Breath sounds: No wheezing.  Skin:    General: Skin is warm and dry.     Comments: Shingles-like rash on the right anterior neck.  Still some vesicles near the mastoid process of her right side.  Neurological:     Comments: Delirious.  Moaning in bed.  Does not seem to recognize her son.     Data Reviewed: I have personally reviewed following labs and imaging studies  CBC: Recent Labs  Lab 02/15/23 0354 02/16/23 0340 02/17/23 1100 02/18/23 0412 02/19/23 0406  WBC 14.9* 22.7* 21.1* 16.2* 16.6*  NEUTROABS 13.6* 19.9* 18.4* 13.8* 14.5*  HGB 14.3 14.1 14.9 13.8 13.2  HCT 42.9 42.5 45.3 41.9 39.4  MCV 95.1 92.6 95.6 91.9 93.4  PLT 99* 141* 107* 123* 130*   Basic Metabolic Panel: Recent Labs  Lab 03/05/2023 1812 02/15/23 0354 02/16/23 0340  02/17/23 1100 02/18/23 0412 02/19/23 0406  NA 135 135 132* 130* 131* 132*  K 4.0 4.4 4.2 4.3 4.5 4.4  CL 99 98 96* 94* 96* 100  CO2 24 21* 23 23 24  21*  GLUCOSE 163* 138* 129* 121* 117* 137*  BUN 19 21 20 19 23  24*  CREATININE 0.93 0.90 0.85 0.80 0.81 0.79  CALCIUM 7.9* 8.1* 8.6* 8.5* 8.5* 8.6*  MG 2.2 2.2 2.3 2.4  --  2.4   GFR: Estimated Creatinine Clearance: 33.3 mL/min (by C-G formula based on SCr of 0.79 mg/dL). Liver Function Tests: Recent Labs  Lab 03-12-2023 1241 02/13/23 0449 02/18/23 0412  AST 36 28 31  ALT 43 34 27  ALKPHOS 137* 125 130*  BILITOT 1.5* 1.9* 2.2*  PROT 5.7* 5.6* 5.6*  ALBUMIN 3.2* 3.0* 2.7*   Coagulation Profile: Recent Labs  Lab 02/13/23 0449 02/27/2023 1812  INR 1.3* 1.4*    Recent Results (from the past 240 hour(s))  Surgical pcr screen     Status: None   Collection Time: 02/13/2023  7:26 AM    Specimen: Nasal Mucosa; Nasal Swab  Result Value Ref Range Status   MRSA, PCR NEGATIVE NEGATIVE Final   Staphylococcus aureus NEGATIVE NEGATIVE Final    Comment: (NOTE) The Xpert SA Assay (FDA approved for NASAL specimens in patients 19 years of age and older), is one component of a comprehensive surveillance program. It is not intended to diagnose infection nor to guide or monitor treatment. Performed at [facility], [address] [place], [place] [address]      Radiology Studies: No results found.  Scheduled Meds:  acetaminophen  1,000 mg Oral TID   celecoxib  100 mg Oral BID   dabigatran  75 mg Oral Q12H   diclofenac Sodium  4 g Topical QID   diltiazem  120 mg Oral QHS   docusate sodium  100 mg Oral Daily   furosemide  40 mg Oral Daily   gabapentin  100 mg Oral QHS   isosorbide mononitrate  30 mg Oral Daily   lidocaine  1 patch Transdermal Q24H   magnesium hydroxide  30 mL Oral Daily   metoprolol succinate  25 mg Oral Daily   polyethylene glycol  17 g Oral Daily   rosuvastatin  5 mg Oral QHS   sodium chloride flush  3 mL Intravenous Q12H   valACYclovir  1,000 mg Oral BID   Continuous Infusions:  magnesium sulfate bolus IVPB       LOS: 7 days   Time spent: 45 minutes  Carollee Herter, DO  [facility]  02/19/2023, 11:58 AM

## 2023-02-19 NOTE — Assessment & Plan Note (Signed)
Remains on Pradaxa.  02-19-2023. Pt made comfort care. All meds not providing comfort to patient were stopped.

## 2023-02-19 NOTE — Assessment & Plan Note (Signed)
Palliative care thinks the patient is having delirium from too many pain medications.  They are adjusting her pain medication.  02-19-2023. After discussion with pt's family, Palliative care made the pt comfort care and place pt on fentanyl gts. Pt's delirium much improved now. Pt is sedated.

## 2023-02-19 NOTE — Progress Notes (Addendum)
Palliative Medicine Inpatient Follow Up Note HPI: 89 y.o. female  with past medical history of  abdominal aortic aneurysm (previously followed with Dr. Florene Route surgery (In 2022, aneurysm size was 6.2, patient had declined surgical workup and was no longer following up with vascular surgery), persistent A-fib on Pradaxa, complete heart block status post pacemaker, moderate CAD without obstructive lesions, COPD, aortic insufficiency, type a dissection of descending thoracic aorta, hypertension, hyperlipidemia presented with worsening abdominal pain. She had presented to the ED with abdominal and back pain few days ago. CT showed increased size of abdominal aortic aneurysm up to 7.8 cm without rupture. Patient declined any intervention. She also had evidence of UTI and was discharged on oral Keflex and pain meds. Patient subsequently has changed her mind and wanted to speak to vascular surgery for possible surgical intervention.  On presentation, vascular surgery was consulted.     Palliative medicine was consulted for GOC conversations.  Today's Discussion 02/19/2023  *Please note that this is a verbal dictation therefore any spelling or grammatical errors are due to the "Dragon [employer] One" system interpretation.  Chart reviewed inclusive of vital signs, progress notes, laboratory results, and diagnostic images.   I met with Lissett's RNs, [facility] Marchelle Folks this morning. Misty Stanley shares that patient slipped out of bed overnight. She feels that Holley lost strength. Ky did not have acute pain overnight though did appear quite disoriented. We discussed minimizing delirium inducing medications.   I met with Lucendia Herrlich at bedside this morning. She is quite disoriented and continues to ask for physical therapy and her son, Trey Paula. I reoriented her though she would quickly perseverate on meeting with physical therapy.   I called and spoke with patients son, Trey Paula this morning. I shared the events from the night.  We reviewed patients delirium and the concerns associated with it. I asked if a family member could be present with her to provide reorientation and reassurance which he shared would be possible. I discussed how we are trying to modify medications to decrease ongoing risk of delirium.  _____________________________________  Addendum:  I met with patients son, Trey Paula at bedside this late morning. He shares that his mother remains agitated. He and I discussed reasons why this could be and hospital associate delirium. We also reviewed the possibility of deleriogenic medications playing a role. I shared that I would continue to assess her throughout the day.   Reviewed that it is unclear how patient will do moving forward. Discussed fall from the night prior.   Offered support. ______________________________________  Addendum #2:  I met at bedside with patients two oldest son(s). Griselle has endured a change in her breathing pattern. Her SPO2 levels are stable. I spoke to nursing who notes no urine output. A bladder scan was complete without significant retention.  I spoke to patients two sons and shared that it appears Alleyne's body has begun the process of shutting down. I shared openly and honestly that I believe it is time to focus on comfort oriented care.   Patients three sons --> Trey Paula called by phone are all in agreement. They do not desire for Tanayah to suffer and would like to allow her peace as she exits this world.  I spoke to patients RN and alerted Dr. Imogene Burn to the change.  Questions and concerns addressed/Palliative Support Provided.  ________________________________________  Addendum #3:  I went to bedside to offer family support.   Spoke to nursing about fentanyl range and when to titrate.  Discussed that the time frame for patients death is not certain.   Additional Time: 45  Objective Assessment: Vital Signs Vitals:   02/18/23 2010 02/19/23 0740  BP: 135/70 (!) 165/111   Pulse: 78   Resp: 18   Temp: 97.7 F (36.5 C) 97.7 F (36.5 C)  SpO2: 93% 94%    Intake/Output Summary (Last 24 hours) at 02/19/2023 0907 Last data filed at 02/19/2023 0000 Gross per 24 hour  Intake 240 ml  Output --  Net 240 ml    Last Weight  Most recent update: 02/17/2023  8:59 AM    Weight  57.7 kg (127 lb 3.3 oz)            Gen:  Elderly Caucasian F in NAD HEENT: moist mucous membranes CV: Regular rate and rhythm  PULM: On RA, breathing is even and nonlabored ABD: soft/nontender  EXT: No edema  Neuro: Alert and oriented x3   SUMMARY OF RECOMMENDATIONS   DNAR/DNI  Comfort care in the setting of no urine output and clinical decline  Unrestricted visitation  Fentanyl gtt  Ativan ATC and PRN  Bladder scan Qshift and foley for retention >  Anticipate in hospital death  Ongoing PMT support  Time Spent: 35 ______________________________________________________________________________________ Lamarr Lulas [place] Palliative Medicine Team Team Cell Phone: [phone] Please utilize secure chat with additional questions, if there is no response within 30 minutes please call the above phone number  Palliative Medicine Team providers are available by phone from 7am to 7pm daily and can be reached through the team cell phone.  Should this patient require assistance outside of these hours, please call the patient's attending physician.

## 2023-02-20 DIAGNOSIS — Z515 Encounter for palliative care: Secondary | ICD-10-CM | POA: Diagnosis not present

## 2023-02-20 DIAGNOSIS — F05 Delirium due to known physiological condition: Secondary | ICD-10-CM | POA: Diagnosis not present

## 2023-02-20 DIAGNOSIS — B029 Zoster without complications: Secondary | ICD-10-CM | POA: Diagnosis not present

## 2023-02-20 DIAGNOSIS — I5032 Chronic diastolic (congestive) heart failure: Secondary | ICD-10-CM | POA: Diagnosis not present

## 2023-02-20 DIAGNOSIS — I714 Abdominal aortic aneurysm, without rupture, unspecified: Secondary | ICD-10-CM | POA: Diagnosis not present

## 2023-02-20 MED ORDER — GLYCOPYRROLATE 0.2 MG/ML IJ SOLN
0.2000 mg | INTRAMUSCULAR | Status: DC
  Administered 2023-02-20 – 2023-02-21 (×8): 0.2 mg via INTRAVENOUS
  Filled 2023-02-20 (×8): qty 1

## 2023-02-20 MED ORDER — GLYCOPYRROLATE 0.2 MG/ML IJ SOLN
0.1000 mg | INTRAMUSCULAR | Status: DC | PRN
  Administered 2023-02-20: 0.1 mg via INTRAVENOUS
  Filled 2023-02-20: qty 1

## 2023-02-20 MED ORDER — ORAL CARE MOUTH RINSE: 15.0000 mL | OROMUCOSAL | Status: DC | PRN

## 2023-02-20 NOTE — Assessment & Plan Note (Signed)
Pt placed on comfort care. On IV fentanyl gtts. I expect pt to expire while she is in the hospital. This would be an expected death.

## 2023-02-20 NOTE — Progress Notes (Signed)
Daily Progress Note   Patient Name: [name]       Date: 02/20/2023 DOB: 12-23-1925  Age: 89 y.o. MRN#: [id number] Attending Physician: Carollee Herter, DO Primary Care Physician: [name], MD Admit Date: 03/02/2023  Reason for Consultation/Follow-up: Terminal Care  Patient Profile/HPI:  89 y.o. female  with past medical history of  abdominal aortic aneurysm (previously followed with Dr. Florene Route surgery (In 2022, aneurysm size was 6.2, patient had declined surgical workup and was no longer following up with vascular surgery), persistent A-fib on Pradaxa, complete heart block status post pacemaker, moderate CAD without obstructive lesions, COPD, aortic insufficiency, type a dissection of descending thoracic aorta, hypertension, hyperlipidemia presented with worsening abdominal pain. She had presented to the ED with abdominal and back pain few days ago. CT showed increased size of abdominal aortic aneurysm up to 7.8 cm without rupture. Patient declined any intervention. She also had evidence of UTI and was discharged on oral Keflex and pain meds. Patient subsequently has changed her mind and wanted to speak to vascular surgery for possible surgical intervention.  On presentation, vascular surgery was consulted.  She underwent EVAR for symptomatic AAA. She has had significant decline since her surgery and was transitioned to full comfort measures only.   Subjective: Chart reviewed including labs, progress notes, imaging from this and previous encounters.  Patient unresponsive, several sons at bedside. Had an episode of increased work of breathing overnight and increased secretions.  Discussed terminal secretions with family.    Review of Systems  Unable to perform ROS: Acuity of  condition     Physical Exam Vitals and nursing note reviewed.  Constitutional:      Appearance: She is ill-appearing.  Pulmonary:     Effort: Pulmonary effort is normal.     Comments: Terminal secretions Neurological:     Comments: unresponsive             Vital Signs: BP (!) 111/59 (BP Location: Left Arm)   Pulse 70   Temp (!) 97.3 F (36.3 C) (Axillary)   Resp 16   Ht 5\' 2"  (1.575 m)   Wt 57.7 kg   SpO2 90%   BMI 23.27 kg/m  SpO2: SpO2: 90 % O2 Device: O2 Device: Nasal Cannula O2 Flow Rate: O2 Flow Rate (L/min): 2 L/min  Intake/output summary:  Intake/Output Summary (Last 24 hours) at 02/20/2023 1226 Last data filed at 02/20/2023 0606 Gross per 24 hour  Intake 100 ml  Output 300 ml  Net -200 ml   LBM: Last BM Date : 02/19/23 Baseline Weight: Weight: 53.1 kg Most recent weight: Weight: 57.7 kg       Palliative Assessment/Data: PPS: 10%      Patient Active Problem List   Diagnosis Date Noted   End of life care 02/20/2023   Shingles rash 02/19/2023   Delirium due to multiple etiologies, acute, hyperactive 02/19/2023   Hyponatremia 02/19/2023   Abdominal aortic aneurysm (AAA) (HCC) 03/09/2023   Dizziness 10/12/2021   Pacemaker battery depletion 04/16/2021   Ascending cholangitis    Choledocholithiasis with obstruction 02/06/2021   Pain in the abdomen 02/05/2021   Elevated LFTs    Transaminitis    History of gallstones    AAA (abdominal aortic aneurysm) (HCC) 02/04/2021   Elevated liver enzymes 02/04/2021   Nonrheumatic aortic valve insufficiency 04/24/2018   Chronic diastolic heart failure (HCC) 04/24/2018   Renovascular hypertension 04/24/2018   Ascending aortic aneurysm (HCC) 04/24/2018   Dissection of descending thoracic aorta (HCC) 04/24/2018   Hyperkalemia 04/24/2018   Aortic aneurysm (HCC) 10/23/2017   Nonrheumatic aortic (valve) stenosis 06/25/2017   Complete heart block (HCC) 01/23/2016   Chronic anticoagulation 05/04/2015   Abnormal  myocardial perfusion study    Hypertensive heart disease without heart failure    Coronary artery disease involving native coronary artery of native heart without angina pectoris    Chest pain 04/19/2015   AAA (abdominal aortic aneurysm) without rupture (HCC)    Pelvic fracture (HCC) 09/16/2014   Fracture of left superior pubic ramus (HCC) 09/14/2014   Contusion of forehead 09/14/2014   Calculus of bile duct 06/23/2013   Pacemaker 12/07/2012   Hypercholesterolemia 12/07/2012   CVA (cerebral vascular accident) (HCC) 01/22/2012   Near syncope 01/17/2012   Longstanding persistent atrial fibrillation (HCC) 01/05/2012   CAD, moderate 09/05/2011   COPD with acute exacerbation (HCC) 09/05/2011   HTN (hypertension) 09/03/2011   Dyspnea on exertion, secondary to AF 09/03/2011   RBBB, intermittent 09/03/2011   History of colonic polyps 06/21/2011   Right bundle branch block with left anterior fascicular block 03/19/2011   Allergic rhinitis due to pollen 07/17/2010   GERD 10/25/2009   ABDOMINAL PAIN-EPIGASTRIC 10/25/2009   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  Thank you for allowing the Palliative Medicine Team to assist in the care of this patient.   Ocie Bob, AGNP-C Palliative Medicine   Please contact Palliative Medicine Team phone at [phone] for questions and concerns.

## 2023-02-20 NOTE — Plan of Care (Signed)
  Problem: Coping: Goal: Level of anxiety will decrease Outcome: Progressing   Patient resting comfortably in bed, family at bedside.   Problem: Pain Managment: Goal: General experience of comfort will improve Outcome: Progressing Patient presenting as comfortable.  Currently on Fentanyl 10 mcg/hr.    Problem: Clinical Measurements: Goal: Ability to maintain clinical measurements within normal limits will improve Outcome: Not Progressing Goal: Diagnostic test results will improve Outcome: Not Progressing  Goal: Respiratory complications will improve Outcome: Not Progressing  Patient o2 sat of 83% on 2 L-[place]   Problem: Activity: Goal: Risk for activity intolerance will decrease Outcome: Not Progressing   Problem: Nutrition: Goal: Adequate nutrition will be maintained Outcome: Not Progressing  Patient not receiving oral intake.

## 2023-02-20 NOTE — Progress Notes (Signed)
PROGRESS NOTE    Erendira Mendlowitz  VZD:[id number] DOB: Apr 12, 1926 DOA: 02/24/2023 PCP: [name], MD  Subjective: Patient seen and examined.  Patient's son Lloyd Huger at the bedside.  Events of last yesterday noted. Pt made comfort care by palliative team. Pt placed on fentanyl IV gtts. Lloyd Huger asks if feeding tube would be placed into patient. I told him that it would not be placed. Explained to Lloyd Huger that pt is dying and that feeding tube would only prolong pt's suffering. He does not want pt to suffering unnecessarily.     Hospital Course: HPI: Shakia Bruch is a 89 y.o. female with medical history significant of abdominal aortic aneurysm (previously followed with Dr. Florene Route surgery: In 2022, aneurysm size was 6.2, patient had declined surgical workup and was no longer following up with vascular surgery), persistent A-fib on Pradaxa, complete heart block status post pacemaker, moderate CAD without obstructive lesions, COPD, aortic insufficiency, type a dissection of descending thoracic aorta, hypertension, hyperlipidemia presented with worsening abdominal pain.  She had presented to the ED with abdominal and back pain few days ago.  CT showed increased size of abdominal aortic aneurysm up to 7.8 cm without rupture.  Patient declined any intervention.  She also had evidence of UTI and was discharged on oral Keflex and pain meds.  Patient subsequently has changed her mind and wants to speak to vascular surgery for possible surgical intervention.  No chest pain, shortness of breath, fever, vomiting, diarrhea, dysuria, hematuria, loss of consciousness or seizures reported.   Hospital Course from 03/06/2023 thru 02-18-2023  On presentation, vascular surgery was consulted.  She underwent EVAR by vascular surgery on 03/14/2023.  Subsequently, she developed possible shingles rash around right neck and has been started on Valtrex.  Palliative care has been involved as well.   Significant  Events: Admitted 03/13/2023 for abd pain 02-19-2023. Made comfort care by palliative care team. Fentanyl gtts started.  Significant Labs:   Significant Imaging Studies: Admission CTA Increased size of the infrarenal abdominal aortic aneurysm now measuring 7.8 x 7.8 cm, previously 7.8 x 7.6 cm. There is new hazy fat stranding about the aneurysm concerning for impending rupture.  Antibiotic Therapy: Anti-infectives (From admission, onward)    Start     Dose/Rate Route Frequency Ordered Stop   02/16/23 1000  valACYclovir (VALTREX) tablet 1,000 mg        1,000 mg Oral 2 times daily 02/16/23 0902 02/26/23 0959   02/16/23 0400  acyclovir (ZOVIRAX) tablet 800 mg  Status:  Discontinued        800 mg Oral 5 times daily 02/16/23 0254 02/16/23 0901   02/26/2023 2200  cephALEXin (KEFLEX) capsule 500 mg  Status:  Discontinued        500 mg Oral 2 times daily 02/23/2023 1510 02/16/23 1043       Procedures: 2023-03-14 Aortobiiliac stent graft for repair of abdominal aortic aneurysm (Gore Excluder)   Consultants: Vascular Surgery Palliative Care    Assessment and Plan: * Abdominal aortic aneurysm (AAA) (HCC) previously followed with Dr. Florene Route surgery: In 2022, aneurysm size was 6.2, patient had declined surgical workup and was no longer following up with vascular surgery -Recent CT had shown increased size of abdominal aortic aneurysm up to 7.8 cm without rupture.  Patient declined any intervention. - Patient subsequently changed her mind and now wants surgical intervention. -Repeat CT angio on presentation showed increased size of infrarenal abdominal arctic aneurysm measuring 7.8 x 7.8 cm.   -underwent EVAR by vascular surgery  on 03/05/2023.  Vascular surgery has cleared the patient for discharge with outpatient follow-up.  End of life care Pt placed on comfort care. On IV fentanyl gtts. I expect pt to expire while she is in the hospital. This would be an expected death.  Delirium due to  multiple etiologies, acute, hyperactive Palliative care thinks the patient is having delirium from too many pain medications.  They are adjusting her pain medication.  02-19-2023. After discussion with pt's family, Palliative care made the pt comfort care and place pt on fentanyl gts. Pt's delirium much improved now. Pt is sedated.  Shingles rash Patient developed painful blistering rash on 02-16-2023 and had been started on Valtrex. Pt placed on airborne isolation.   Gabapentin was discontinued on 02/16/2023 because of patient being very sleepy.  Low-dose of gabapentin resumed on 02/17/2023  Palliative care consulted to help with pain management. 02-19-2023. Pt placed on fentanyl gtts for end-of-life care.  Hyponatremia Mild.  Sodium 132.  No clinical significance.  Chronic diastolic heart failure (HCC) Stable.  Euvolemic.  Remains on Toprol-XL 25 mg daily, Imdur 30 mg daily, Lasix 40 mg daily,  02-19-2023. Pt made comfort care. All meds not providing comfort to patient were stopped.  Chronic anticoagulation Remains on Pradaxa.  02-19-2023. Pt made comfort care. All meds not providing comfort to patient were stopped.  Pacemaker Chronic.  Orders placed to deactivate defibrillator.  Longstanding persistent atrial fibrillation (HCC) Stable.  Remains on Pradaxa.  Remains on Cardizem CD to 120 mg daily.  02-19-2023. Pt made comfort care. All meds not providing comfort to patient were stopped.  HTN (hypertension) Stable.  Remains on Lasix 40 mg daily, Cardizem CD 120 mg daily, Toprol-XL 25 mg daily 02-19-2023. Pt made comfort care. All meds not providing comfort to patient were stopped.   DVT prophylaxis: SCD's Start: 03/06/2023 1500  None because of comfort care   Code Status: Do not attempt resuscitation (DNR) - Comfort care Family Communication: spoke with pt's son Lloyd Huger at bedside Disposition Plan: expected in-house death Reason for continuing need for hospitalization: remains on IV  fentanyl gtts for end-of-life care.  Objective: Vitals:   02/20/23 0000 02/20/23 0529 02/20/23 0846 02/20/23 0848  BP:  (!) 111/59    Pulse: 70 70    Resp:  16    Temp:  97.7 F (36.5 C) (!) 97.3 F (36.3 C) (!) 97.3 F (36.3 C)  TempSrc:  Axillary Axillary Axillary  SpO2: 91% 91% 90%   Weight:      Height:        Intake/Output Summary (Last 24 hours) at 02/20/2023 1013 Last data filed at 02/20/2023 0606 Gross per 24 hour  Intake 100 ml  Output 300 ml  Net -200 ml   Filed Weights   2023-03-11 1215 02/23/2023 0500 02/17/23 0732  Weight: 53.1 kg 54.2 kg 57.7 kg    Examination:  Physical Exam Vitals and nursing note reviewed.  Constitutional:      Comments: Elderly 89 year old female, chronically ill-appearing. Resting peacefully today.  HENT:     Head: Normocephalic and atraumatic.  Cardiovascular:     Rate and Rhythm: Normal rate and regular rhythm.     Pulses: Normal pulses.  Pulmonary:     Effort: No respiratory distress.     Breath sounds: No wheezing.     Comments: Softly snoring Skin:    General: Skin is warm and dry.     Comments: Shingles-like rash on the right anterior neck.  Still some vesicles  near the mastoid process of her right side.  Neurological:     Comments: Appears much more peaceful this AM.    Data Reviewed: I have personally reviewed following labs and imaging studies  CBC: Recent Labs  Lab 02/15/23 0354 02/16/23 0340 02/17/23 1100 02/18/23 0412 02/19/23 0406  WBC 14.9* 22.7* 21.1* 16.2* 16.6*  NEUTROABS 13.6* 19.9* 18.4* 13.8* 14.5*  HGB 14.3 14.1 14.9 13.8 13.2  HCT 42.9 42.5 45.3 41.9 39.4  MCV 95.1 92.6 95.6 91.9 93.4  PLT 99* 141* 107* 123* 130*   Basic Metabolic Panel: Recent Labs  Lab 03/11/2023 1812 02/15/23 0354 02/16/23 0340 02/17/23 1100 02/18/23 0412 02/19/23 0406  NA 135 135 132* 130* 131* 132*  K 4.0 4.4 4.2 4.3 4.5 4.4  CL 99 98 96* 94* 96* 100  CO2 24 21* 23 23 24  21*  GLUCOSE 163* 138* 129* 121* 117*  137*  BUN 19 21 20 19 23  24*  CREATININE 0.93 0.90 0.85 0.80 0.81 0.79  CALCIUM 7.9* 8.1* 8.6* 8.5* 8.5* 8.6*  MG 2.2 2.2 2.3 2.4  --  2.4   GFR: Estimated Creatinine Clearance: 33.3 mL/min (by C-G formula based on SCr of 0.79 mg/dL). Liver Function Tests: Recent Labs  Lab 02/18/23 0412  AST 31  ALT 27  ALKPHOS 130*  BILITOT 2.2*  PROT 5.6*  ALBUMIN 2.7*   Coagulation Profile: Recent Labs  Lab 03/06/2023 1812  INR 1.4*    Recent Results (from the past 240 hour(s))  Surgical pcr screen     Status: None   Collection Time: 03/06/2023  7:26 AM   Specimen: Nasal Mucosa; Nasal Swab  Result Value Ref Range Status   MRSA, PCR NEGATIVE NEGATIVE Final   Staphylococcus aureus NEGATIVE NEGATIVE Final    Comment: (NOTE) The Xpert SA Assay (FDA approved for NASAL specimens in patients 45 years of age and older), is one component of a comprehensive surveillance program. It is not intended to diagnose infection nor to guide or monitor treatment. Performed at [facility], [address], [place], [place] [address]      Radiology Studies: No results found.  Scheduled Meds:  LORazepam  1 mg Intravenous Q6H   Continuous Infusions:  fentaNYL infusion INTRAVENOUS 100 mcg/hr (02/20/23 0606)     LOS: 8 days   Time spent: 35 minutes  Carollee Herter, DO  [facility]  02/20/2023, 10:13 AM

## 2023-02-20 NOTE — Plan of Care (Signed)
Plan of care is reviewed.   Problem: Pain Managment: Goal: General experience of comfort will improve Outcome: Not Progressing. On Fentanyl IV continue drip currently at 10 mcg/ hr. She is able to rest comfortably.  DNR with comfort care, good family members support at bedside.   Problem: Respiratory: Goal: Ability to achieve and maintain a regular respiratory rate will improve Outcome: Not Progressing. SPO2 90-92%, On 2 LPM of O2 NCL for support.    Problem: Nutrition: Goal: Adequate nutrition will be maintained Outcome: Not Progressing. Drowsy, risk of aspiration, poor/ no oral intake.   Problem: Activity: Goal: Risk for activity intolerance will decrease Outcome: Not Progressing. Pt responds to painful stimuli, prefers bedrest, DNR with comfort care.     Problem: Clinical Measurements: Goal: Cardiovascular complication will be avoided Outcome: Not Progressing: Pt is decline hemodynamically, BP 77/51 mmHg, HR 70 with permanent pacemaker.   Filiberto Pinks, RN

## 2023-02-20 NOTE — Progress Notes (Signed)
   We have been following this pt for home with hospice originally but after the events and changes PC asked that we evaluate for our Hospice facility. She has been approved for our hospice facility by our MD. However we do not have a bed to offer today. I am afraid if one doesn't open up today we may loose our window of opportunity to transfer pt as it is felt she is actively dying. Family at bedside and coping well despite the changes. Pt appears comfortable. We will continues to follow and upon bed availability will evaluate if pt stable enough to transfer.  Norm Parcel RN [phone]

## 2023-02-21 ENCOUNTER — Other Ambulatory Visit: Payer: Self-pay

## 2023-02-21 DIAGNOSIS — I713 Abdominal aortic aneurysm, ruptured, unspecified: Secondary | ICD-10-CM

## 2023-02-21 DIAGNOSIS — I714 Abdominal aortic aneurysm, without rupture, unspecified: Secondary | ICD-10-CM | POA: Diagnosis not present

## 2023-02-21 DIAGNOSIS — F05 Delirium due to known physiological condition: Secondary | ICD-10-CM | POA: Diagnosis not present

## 2023-02-21 DIAGNOSIS — Z515 Encounter for palliative care: Secondary | ICD-10-CM | POA: Diagnosis not present

## 2023-02-21 DIAGNOSIS — B029 Zoster without complications: Secondary | ICD-10-CM | POA: Diagnosis not present

## 2023-02-21 DIAGNOSIS — I7143 Infrarenal abdominal aortic aneurysm, without rupture: Principal | ICD-10-CM

## 2023-03-14 NOTE — Progress Notes (Signed)
PROGRESS NOTE    [name]  UVO:[id number] DOB: 05-19-1925 DOA: 2023/02/17 PCP: [name], MD  Subjective: Patient seen and examined.  Patient son Lloyd Huger and son Freida Busman at the bedside.  Pt requiring IV robinul due to excessive secretions.  Remains on IV fentanyl Both sons state they do not want pt to suffer    Hospital Course: HPI: [name] is a 89 y.o. female with medical history significant of abdominal aortic aneurysm (previously followed with Dr. Florene Route surgery: In 2022, aneurysm size was 6.2, patient had declined surgical workup and was no longer following up with vascular surgery), persistent A-fib on Pradaxa, complete heart block status post pacemaker, moderate CAD without obstructive lesions, COPD, aortic insufficiency, type a dissection of descending thoracic aorta, hypertension, hyperlipidemia presented with worsening abdominal pain.  She had presented to the ED with abdominal and back pain few days ago.  CT showed increased size of abdominal aortic aneurysm up to 7.8 cm without rupture.  Patient declined any intervention.  She also had evidence of UTI and was discharged on oral Keflex and pain meds.  Patient subsequently has changed her mind and wants to speak to vascular surgery for possible surgical intervention.  No chest pain, shortness of breath, fever, vomiting, diarrhea, dysuria, hematuria, loss of consciousness or seizures reported.   Hospital Course from 17-Feb-2023 thru 02-18-2023  On presentation, vascular surgery was consulted.  She underwent EVAR by vascular surgery on 03/02/2023.  Subsequently, she developed possible shingles rash around right neck and has been started on Valtrex.  Palliative care has been involved as well.   Significant Events: Admitted February 17, 2023 for abd pain 02-19-2023. Made comfort care by palliative care team. Fentanyl gtts started.  Significant Labs:   Significant Imaging Studies: Admission CTA Increased size of the  infrarenal abdominal aortic aneurysm now measuring 7.8 x 7.8 cm, previously 7.8 x 7.6 cm. There is new hazy fat stranding about the aneurysm concerning for impending rupture.  Antibiotic Therapy: Anti-infectives (From admission, onward)    Start     Dose/Rate Route Frequency Ordered Stop   02/16/23 1000  valACYclovir (VALTREX) tablet 1,000 mg        1,000 mg Oral 2 times daily 02/16/23 0902 02/26/23 0959   02/16/23 0400  acyclovir (ZOVIRAX) tablet 800 mg  Status:  Discontinued        800 mg Oral 5 times daily 02/16/23 0254 02/16/23 0901   02/17/2023 2200  cephALEXin (KEFLEX) capsule 500 mg  Status:  Discontinued        500 mg Oral 2 times daily February 17, 2023 1510 02/16/23 1043       Procedures: 03/02/2023 Aortobiiliac stent graft for repair of abdominal aortic aneurysm (Gore Excluder)   Consultants: Vascular Surgery Palliative Care    Assessment and Plan: * Abdominal aortic aneurysm (AAA) (HCC) previously followed with Dr. Florene Route surgery: In 2022, aneurysm size was 6.2, patient had declined surgical workup and was no longer following up with vascular surgery -Recent CT had shown increased size of abdominal aortic aneurysm up to 7.8 cm without rupture.  Patient declined any intervention. - Patient subsequently changed her mind and now wants surgical intervention. -Repeat CT angio on presentation showed increased size of infrarenal abdominal arctic aneurysm measuring 7.8 x 7.8 cm.   -underwent EVAR by vascular surgery on 03/07/2023.  Vascular surgery has cleared the patient for discharge with outpatient follow-up.  End of life care Pt placed on comfort care. On IV fentanyl gtts. I expect pt to expire while she is  in the hospital. This would be an expected death.  Delirium due to multiple etiologies, acute, hyperactive Palliative care thinks the patient is having delirium from too many pain medications.  They are adjusting her pain medication.  02-19-2023. After discussion with pt's  family, Palliative care made the pt comfort care and place pt on fentanyl gts. Pt's delirium much improved now. Pt is sedated.  Shingles rash Patient developed painful blistering rash on 02-16-2023 and had been started on Valtrex. Pt placed on airborne isolation.   Gabapentin was discontinued on 02/16/2023 because of patient being very sleepy.  Low-dose of gabapentin resumed on 02/17/2023  Palliative care consulted to help with pain management. 02-19-2023. Pt placed on fentanyl gtts for end-of-life care.  Hyponatremia Mild.  Sodium 132.  No clinical significance.  Chronic diastolic heart failure (HCC) Stable.  Euvolemic.  Remains on Toprol-XL 25 mg daily, Imdur 30 mg daily, Lasix 40 mg daily,  02-19-2023. Pt made comfort care. All meds not providing comfort to patient were stopped.  Chronic anticoagulation Remains on Pradaxa.  02-19-2023. Pt made comfort care. All meds not providing comfort to patient were stopped.  Pacemaker Chronic.  Orders placed to deactivate defibrillator.  Longstanding persistent atrial fibrillation (HCC) Stable.  Remains on Pradaxa.  Remains on Cardizem CD to 120 mg daily.  02-19-2023. Pt made comfort care. All meds not providing comfort to patient were stopped.  HTN (hypertension) Stable.  Remains on Lasix 40 mg daily, Cardizem CD 120 mg daily, Toprol-XL 25 mg daily 02-19-2023. Pt made comfort care. All meds not providing comfort to patient were stopped.   DVT prophylaxis: SCD's Start: 02/13/2023 1500  None because of comfort care   Code Status: Do not attempt resuscitation (DNR) - Comfort care Family Communication: spoke with both sons Freida Busman and Lloyd Huger at bedside Disposition Plan: expected in-hospital death Reason for continuing need for hospitalization: on IV fentanyl gtts.  Objective: Vitals:   02/20/23 2200 02/20/23 2300 March 16, 2023 0000 16-Mar-2023 0912  BP:    127/63  Pulse:    70  Resp:    16  Temp:    100.3 F (37.9 C)  TempSrc:    Axillary  SpO2:  94% 95% 96% 92%  Weight:      Height:        Intake/Output Summary (Last 24 hours) at 03/16/2023 1123 Last data filed at 03-16-23 0437 Gross per 24 hour  Intake 0 ml  Output 570 ml  Net -570 ml   Filed Weights   02/28/2023 1215 02/28/2023 0500 02/17/23 0732  Weight: 53.1 kg 54.2 kg 57.7 kg    Examination:  Physical Exam Vitals and nursing note reviewed.  Constitutional:      Comments: Elderly 89 year old female, chronically ill-appearing. Resting peacefully today.  HENT:     Head: Normocephalic and atraumatic.  Cardiovascular:     Rate and Rhythm: Normal rate and regular rhythm.     Pulses: Normal pulses.  Pulmonary:     Effort: No respiratory distress.     Breath sounds: No wheezing.  Skin:    General: Skin is warm and dry.     Comments: Shingles-like rash on the right anterior neck.  Still some vesicles near the mastoid process of her right side.  Neurological:     Comments: Resting peacefully this AM.     Data Reviewed: I have personally reviewed following labs and imaging studies  CBC: Recent Labs  Lab 02/15/23 0354 02/16/23 0340 02/17/23 1100 02/18/23 0412 02/19/23 0406  WBC 14.9*  22.7* 21.1* 16.2* 16.6*  NEUTROABS 13.6* 19.9* 18.4* 13.8* 14.5*  HGB 14.3 14.1 14.9 13.8 13.2  HCT 42.9 42.5 45.3 41.9 39.4  MCV 95.1 92.6 95.6 91.9 93.4  PLT 99* 141* 107* 123* 130*   Basic Metabolic Panel: Recent Labs  Lab Mar 04, 2023 1812 02/15/23 0354 02/16/23 0340 02/17/23 1100 02/18/23 0412 02/19/23 0406  NA 135 135 132* 130* 131* 132*  K 4.0 4.4 4.2 4.3 4.5 4.4  CL 99 98 96* 94* 96* 100  CO2 24 21* 23 23 24  21*  GLUCOSE 163* 138* 129* 121* 117* 137*  BUN 19 21 20 19 23  24*  CREATININE 0.93 0.90 0.85 0.80 0.81 0.79  CALCIUM 7.9* 8.1* 8.6* 8.5* 8.5* 8.6*  MG 2.2 2.2 2.3 2.4  --  2.4   GFR: Estimated Creatinine Clearance: 33.3 mL/min (by C-G formula based on SCr of 0.79 mg/dL). Liver Function Tests: Recent Labs  Lab 02/18/23 0412  AST 31  ALT 27   ALKPHOS 130*  BILITOT 2.2*  PROT 5.6*  ALBUMIN 2.7*   Coagulation Profile: Recent Labs  Lab 03/04/23 1812  INR 1.4*    Recent Results (from the past 240 hour(s))  Surgical pcr screen     Status: None   Collection Time: 2023-03-04  7:26 AM   Specimen: Nasal Mucosa; Nasal Swab  Result Value Ref Range Status   MRSA, PCR NEGATIVE NEGATIVE Final   Staphylococcus aureus NEGATIVE NEGATIVE Final    Comment: (NOTE) The Xpert SA Assay (FDA approved for NASAL specimens in patients 58 years of age and older), is one component of a comprehensive surveillance program. It is not intended to diagnose infection nor to guide or monitor treatment. Performed at [facility], [address], [place], [place] [address]      Radiology Studies: No results found.  Scheduled Meds:  glycopyrrolate  0.2 mg Intravenous Q4H   LORazepam  1 mg Intravenous Q6H   Continuous Infusions:  fentaNYL infusion INTRAVENOUS 100 mcg/hr (03/01/2023 0926)     LOS: 9 days   Time spent: 30 minutes  Carollee Herter, DO  [facility]  02/20/2023, 11:23 AM

## 2023-03-14 NOTE — Progress Notes (Addendum)
Patient's nurse reported that patient passed away and pronounced death at 7:17pm on 11-Mar-2023. Per chart review patient was on comfort care. I have informed daytime physician Dr. Carollee Herter to request for discharge as death summary and completion of death certificate.  Thank you Tereasa Coop, MD [facility] Mar 11, 2023, 7:32 PM

## 2023-03-14 NOTE — Death Summary Note (Signed)
0.91 cm AV Vmax:           269.67 cm/s AV Vmean:          175.667 cm/s AV VTI:            0.477 m AV Peak Grad:      29.1 mmHg AV Mean Grad:      16.0 mmHg LVOT Vmax:         80.17 cm/s LVOT Vmean:        50.133 cm/s LVOT VTI:          0.141 m LVOT/AV VTI ratio: 0.30 AI PHT:            547 msec  AORTA Ao Root diam: 3.10 cm Ao Asc diam:  4.40 cm  MITRAL VALVE                TRICUSPID VALVE MV Area (PHT): 3.91 cm     TR Peak grad:   51.0 mmHg MV Peak grad:  14.5 mmHg    TR Vmax:        357.00 cm/s MV Mean grad:  4.4 mmHg MV Vmax:       1.90 m/s     SHUNTS MV Vmean:      117.5 cm/s   Systemic VTI:  0.14 m MV Decel Time: 194 msec     Systemic Diam: 1.98 cm MR Peak grad: 81.0 mmHg MR Vmax:      450.00 cm/s MV E velocity: 178.00 cm/s MV A velocity: 49.10 cm/s MV E/A ratio:  3.63 Dalton McleanMD Electronically signed by Wilfred Lacy Signature Date/Time: 02/13/2023/9:46:33 AM    Final    CT Angio Abd/Pel W and/or Wo Contrast  Result Date: 02/15/2023 CLINICAL DATA:  Abdominal aortic aneurysm, preop planning EXAM: CTA ABDOMEN AND PELVIS WITHOUT AND WITH CONTRAST TECHNIQUE: Multidetector CT imaging of the abdomen and pelvis was performed using the standard protocol during bolus administration of intravenous contrast. Multiplanar reconstructed images and MIPs were obtained and reviewed to evaluate the vascular anatomy. RADIATION DOSE REDUCTION: This exam was performed according to the departmental dose-optimization program which includes automated exposure control, adjustment of the mA and/or kV according to patient size and/or use of iterative reconstruction technique. CONTRAST:  75mL OMNIPAQUE IOHEXOL 350 MG/ML SOLN COMPARISON:  None Available. FINDINGS: VASCULAR Aorta: The abdominal aorta and its branches are not well opacified likely due to heart failure as a significant portion of the contrast fills the IVC and hepatic veins. Increased size of the infrarenal abdominal aortic aneurysm now measuring 7.8 x 7.8 cm, previously 7.8 x 7.6 cm there is new hazy fat stranding about the aneurysm concerning for impending rupture. Calcified atherosclerotic plaque. Celiac: Patency is not well evaluated due to slow flow. SMA: Patency is not well evaluated due to slow flow. Renals: Patency is not well evaluated due to slow flow. IMA: Patency is not well evaluated due to  slow flow. Inflow: Patency is not well evaluated due to slow flow. Proximal Outflow: Patency is not well evaluated due to slow flow. Veins: Portal venous gas in the left hepatic lobe. Review of the MIP images confirms the above findings. NON-VASCULAR Lower chest: No acute abnormality. Hepatobiliary: Cholecystectomy. Pancreas: Unremarkable. Spleen: Unremarkable. Adrenals/Urinary Tract: Stable adrenal glands and kidneys. No obstructing urinary calculi or hydronephrosis. Unremarkable bladder. Stomach/Bowel: Normal caliber large and small bowel. Moderate colonic stool load. Colonic diverticulosis without diverticulitis. No wall thickening or pneumatosis to suggest source of portal venous gas in the left hepatic lobe. Unfortunately  DEATH SUMMARY   Patient Details  Name: [name] MRN: [id number] DOB: July 21, 1925 QMV:[id number], Campbell Lerner, MD  Admission/Discharge Information   Admit Date:  02/23/2023  Date of Death: Date of Death: 03/04/2023  Time of Death: Time of Death: 1916/03/11  Length of Stay: 9   Principle Cause of death: Abdominal Aortic Aneursym  Hospital Diagnoses: Principal Problem:   Abdominal aortic aneurysm (AAA) (HCC) Active Problems:   Shingles rash   Delirium due to multiple etiologies, acute, hyperactive   Pacemaker   End of life care   HTN (hypertension)   Longstanding persistent atrial fibrillation (HCC)   Chronic anticoagulation   Chronic diastolic heart failure (HCC)   Hyponatremia  HPI: [name] is a 89 y.o. female with medical history significant of abdominal aortic aneurysm (previously followed with Dr. Florene Route surgery: In March 11, 2021, aneurysm size was 6.2, patient had declined surgical workup and was no longer following up with vascular surgery), persistent A-fib on Pradaxa, complete heart block status post pacemaker, moderate CAD without obstructive lesions, COPD, aortic insufficiency, type a dissection of descending thoracic aorta, hypertension, hyperlipidemia presented with worsening abdominal pain.  She had presented to the ED with abdominal and back pain few days ago.  CT showed increased size of abdominal aortic aneurysm up to 7.8 cm without rupture.  Patient declined any intervention.  She also had evidence of UTI and was discharged on oral Keflex and pain meds.  Patient subsequently has changed her mind and wants to speak to vascular surgery for possible surgical intervention.  No chest pain, shortness of breath, fever, vomiting, diarrhea, dysuria, hematuria, loss of consciousness or seizures reported.   Hospital Course from 02/23/2023 thru 02-18-2023  On presentation, vascular surgery was consulted.  She underwent EVAR by vascular surgery on 02/19/2023.  Subsequently, she  developed possible shingles rash around right neck and has been started on Valtrex.  Palliative care has been involved as well.   Significant Events: Admitted 02/23/23 for abd pain 02-19-2023. Made comfort care by palliative care team. Fentanyl gtts started.  Significant Labs:   Significant Imaging Studies: Admission CTA Increased size of the infrarenal abdominal aortic aneurysm now measuring 7.8 x 7.8 cm, previously 7.8 x 7.6 cm. There is new hazy fat stranding about the aneurysm concerning for impending rupture.  Antibiotic Therapy: Anti-infectives (From admission, onward)    Start     Dose/Rate Route Frequency Ordered Stop   02/16/23 1000  valACYclovir (VALTREX) tablet 1,000 mg        1,000 mg Oral 2 times daily 02/16/23 0902 02/26/23 0959   02/16/23 0400  acyclovir (ZOVIRAX) tablet 800 mg  Status:  Discontinued        800 mg Oral 5 times daily 02/16/23 0254 02/16/23 0901   Feb 23, 2023 2200  cephALEXin (KEFLEX) capsule 500 mg  Status:  Discontinued        500 mg Oral 2 times daily 2023-02-23 1510 02/16/23 1043       Procedures: 03/07/2023 Aortobiiliac stent graft for repair of abdominal aortic aneurysm (Gore Excluder)   Consultants: Vascular Surgery Palliative [facility] Course 02-19-2023 to 2023-03-04  * Abdominal aortic aneurysm (AAA) [facility]) previously followed with Dr. Florene Route surgery: In Mar 11, 2021, aneurysm size was 6.2, patient had declined surgical workup and was no longer following up with vascular surgery -Recent CT had shown increased size of abdominal aortic aneurysm up to 7.8 cm without rupture.  Patient declined any intervention. - Patient subsequently changed her mind and now wants surgical intervention. -Repeat CT  DEATH SUMMARY   Patient Details  Name: [name] MRN: [id number] DOB: July 21, 1925 QMV:HQIONGEXBMWU, Campbell Lerner, MD  Admission/Discharge Information   Admit Date:  02/23/2023  Date of Death: Date of Death: 03/04/2023  Time of Death: Time of Death: 1916/03/11  Length of Stay: 9   Principle Cause of death: Abdominal Aortic Aneursym  Hospital Diagnoses: Principal Problem:   Abdominal aortic aneurysm (AAA) (HCC) Active Problems:   Shingles rash   Delirium due to multiple etiologies, acute, hyperactive   Pacemaker   End of life care   HTN (hypertension)   Longstanding persistent atrial fibrillation (HCC)   Chronic anticoagulation   Chronic diastolic heart failure (HCC)   Hyponatremia  HPI: [name] is a 89 y.o. female with medical history significant of abdominal aortic aneurysm (previously followed with Dr. Florene Route surgery: In March 11, 2021, aneurysm size was 6.2, patient had declined surgical workup and was no longer following up with vascular surgery), persistent A-fib on Pradaxa, complete heart block status post pacemaker, moderate CAD without obstructive lesions, COPD, aortic insufficiency, type a dissection of descending thoracic aorta, hypertension, hyperlipidemia presented with worsening abdominal pain.  She had presented to the ED with abdominal and back pain few days ago.  CT showed increased size of abdominal aortic aneurysm up to 7.8 cm without rupture.  Patient declined any intervention.  She also had evidence of UTI and was discharged on oral Keflex and pain meds.  Patient subsequently has changed her mind and wants to speak to vascular surgery for possible surgical intervention.  No chest pain, shortness of breath, fever, vomiting, diarrhea, dysuria, hematuria, loss of consciousness or seizures reported.   Hospital Course from 02/23/2023 thru 02-18-2023  On presentation, vascular surgery was consulted.  She underwent EVAR by vascular surgery on 02/19/2023.  Subsequently, she  developed possible shingles rash around right neck and has been started on Valtrex.  Palliative care has been involved as well.   Significant Events: Admitted 02/23/23 for abd pain 02-19-2023. Made comfort care by palliative care team. Fentanyl gtts started.  Significant Labs:   Significant Imaging Studies: Admission CTA Increased size of the infrarenal abdominal aortic aneurysm now measuring 7.8 x 7.8 cm, previously 7.8 x 7.6 cm. There is new hazy fat stranding about the aneurysm concerning for impending rupture.  Antibiotic Therapy: Anti-infectives (From admission, onward)    Start     Dose/Rate Route Frequency Ordered Stop   02/16/23 1000  valACYclovir (VALTREX) tablet 1,000 mg        1,000 mg Oral 2 times daily 02/16/23 0902 02/26/23 0959   02/16/23 0400  acyclovir (ZOVIRAX) tablet 800 mg  Status:  Discontinued        800 mg Oral 5 times daily 02/16/23 0254 02/16/23 0901   Feb 23, 2023 2200  cephALEXin (KEFLEX) capsule 500 mg  Status:  Discontinued        500 mg Oral 2 times daily 2023-02-23 1510 02/16/23 1043       Procedures: 03/07/2023 Aortobiiliac stent graft for repair of abdominal aortic aneurysm (Gore Excluder)   Consultants: Vascular Surgery Palliative [facility] Course 02-19-2023 to 2023-03-04  * Abdominal aortic aneurysm (AAA) [facility]) previously followed with Dr. Florene Route surgery: In Mar 11, 2021, aneurysm size was 6.2, patient had declined surgical workup and was no longer following up with vascular surgery -Recent CT had shown increased size of abdominal aortic aneurysm up to 7.8 cm without rupture.  Patient declined any intervention. - Patient subsequently changed her mind and now wants surgical intervention. -Repeat CT  DEATH SUMMARY   Patient Details  Name: [name] MRN: [id number] DOB: July 21, 1925 QMV:HQIONGEXBMWU, Campbell Lerner, MD  Admission/Discharge Information   Admit Date:  02/23/2023  Date of Death: Date of Death: 03/04/2023  Time of Death: Time of Death: 1916/03/11  Length of Stay: 9   Principle Cause of death: Abdominal Aortic Aneursym  Hospital Diagnoses: Principal Problem:   Abdominal aortic aneurysm (AAA) (HCC) Active Problems:   Shingles rash   Delirium due to multiple etiologies, acute, hyperactive   Pacemaker   End of life care   HTN (hypertension)   Longstanding persistent atrial fibrillation (HCC)   Chronic anticoagulation   Chronic diastolic heart failure (HCC)   Hyponatremia  HPI: [name] is a 89 y.o. female with medical history significant of abdominal aortic aneurysm (previously followed with Dr. Florene Route surgery: In March 11, 2021, aneurysm size was 6.2, patient had declined surgical workup and was no longer following up with vascular surgery), persistent A-fib on Pradaxa, complete heart block status post pacemaker, moderate CAD without obstructive lesions, COPD, aortic insufficiency, type a dissection of descending thoracic aorta, hypertension, hyperlipidemia presented with worsening abdominal pain.  She had presented to the ED with abdominal and back pain few days ago.  CT showed increased size of abdominal aortic aneurysm up to 7.8 cm without rupture.  Patient declined any intervention.  She also had evidence of UTI and was discharged on oral Keflex and pain meds.  Patient subsequently has changed her mind and wants to speak to vascular surgery for possible surgical intervention.  No chest pain, shortness of breath, fever, vomiting, diarrhea, dysuria, hematuria, loss of consciousness or seizures reported.   Hospital Course from 02/23/2023 thru 02-18-2023  On presentation, vascular surgery was consulted.  She underwent EVAR by vascular surgery on 02/19/2023.  Subsequently, she  developed possible shingles rash around right neck and has been started on Valtrex.  Palliative care has been involved as well.   Significant Events: Admitted 02/23/23 for abd pain 02-19-2023. Made comfort care by palliative care team. Fentanyl gtts started.  Significant Labs:   Significant Imaging Studies: Admission CTA Increased size of the infrarenal abdominal aortic aneurysm now measuring 7.8 x 7.8 cm, previously 7.8 x 7.6 cm. There is new hazy fat stranding about the aneurysm concerning for impending rupture.  Antibiotic Therapy: Anti-infectives (From admission, onward)    Start     Dose/Rate Route Frequency Ordered Stop   02/16/23 1000  valACYclovir (VALTREX) tablet 1,000 mg        1,000 mg Oral 2 times daily 02/16/23 0902 02/26/23 0959   02/16/23 0400  acyclovir (ZOVIRAX) tablet 800 mg  Status:  Discontinued        800 mg Oral 5 times daily 02/16/23 0254 02/16/23 0901   Feb 23, 2023 2200  cephALEXin (KEFLEX) capsule 500 mg  Status:  Discontinued        500 mg Oral 2 times daily 2023-02-23 1510 02/16/23 1043       Procedures: 03/07/2023 Aortobiiliac stent graft for repair of abdominal aortic aneurysm (Gore Excluder)   Consultants: Vascular Surgery Palliative [facility] Course 02-19-2023 to 2023-03-04  * Abdominal aortic aneurysm (AAA) [facility]) previously followed with Dr. Florene Route surgery: In Mar 11, 2021, aneurysm size was 6.2, patient had declined surgical workup and was no longer following up with vascular surgery -Recent CT had shown increased size of abdominal aortic aneurysm up to 7.8 cm without rupture.  Patient declined any intervention. - Patient subsequently changed her mind and now wants surgical intervention. -Repeat CT  0.91 cm AV Vmax:           269.67 cm/s AV Vmean:          175.667 cm/s AV VTI:            0.477 m AV Peak Grad:      29.1 mmHg AV Mean Grad:      16.0 mmHg LVOT Vmax:         80.17 cm/s LVOT Vmean:        50.133 cm/s LVOT VTI:          0.141 m LVOT/AV VTI ratio: 0.30 AI PHT:            547 msec  AORTA Ao Root diam: 3.10 cm Ao Asc diam:  4.40 cm  MITRAL VALVE                TRICUSPID VALVE MV Area (PHT): 3.91 cm     TR Peak grad:   51.0 mmHg MV Peak grad:  14.5 mmHg    TR Vmax:        357.00 cm/s MV Mean grad:  4.4 mmHg MV Vmax:       1.90 m/s     SHUNTS MV Vmean:      117.5 cm/s   Systemic VTI:  0.14 m MV Decel Time: 194 msec     Systemic Diam: 1.98 cm MR Peak grad: 81.0 mmHg MR Vmax:      450.00 cm/s MV E velocity: 178.00 cm/s MV A velocity: 49.10 cm/s MV E/A ratio:  3.63 Dalton McleanMD Electronically signed by Wilfred Lacy Signature Date/Time: 02/13/2023/9:46:33 AM    Final    CT Angio Abd/Pel W and/or Wo Contrast  Result Date: 02/15/2023 CLINICAL DATA:  Abdominal aortic aneurysm, preop planning EXAM: CTA ABDOMEN AND PELVIS WITHOUT AND WITH CONTRAST TECHNIQUE: Multidetector CT imaging of the abdomen and pelvis was performed using the standard protocol during bolus administration of intravenous contrast. Multiplanar reconstructed images and MIPs were obtained and reviewed to evaluate the vascular anatomy. RADIATION DOSE REDUCTION: This exam was performed according to the departmental dose-optimization program which includes automated exposure control, adjustment of the mA and/or kV according to patient size and/or use of iterative reconstruction technique. CONTRAST:  75mL OMNIPAQUE IOHEXOL 350 MG/ML SOLN COMPARISON:  None Available. FINDINGS: VASCULAR Aorta: The abdominal aorta and its branches are not well opacified likely due to heart failure as a significant portion of the contrast fills the IVC and hepatic veins. Increased size of the infrarenal abdominal aortic aneurysm now measuring 7.8 x 7.8 cm, previously 7.8 x 7.6 cm there is new hazy fat stranding about the aneurysm concerning for impending rupture. Calcified atherosclerotic plaque. Celiac: Patency is not well evaluated due to slow flow. SMA: Patency is not well evaluated due to slow flow. Renals: Patency is not well evaluated due to slow flow. IMA: Patency is not well evaluated due to  slow flow. Inflow: Patency is not well evaluated due to slow flow. Proximal Outflow: Patency is not well evaluated due to slow flow. Veins: Portal venous gas in the left hepatic lobe. Review of the MIP images confirms the above findings. NON-VASCULAR Lower chest: No acute abnormality. Hepatobiliary: Cholecystectomy. Pancreas: Unremarkable. Spleen: Unremarkable. Adrenals/Urinary Tract: Stable adrenal glands and kidneys. No obstructing urinary calculi or hydronephrosis. Unremarkable bladder. Stomach/Bowel: Normal caliber large and small bowel. Moderate colonic stool load. Colonic diverticulosis without diverticulitis. No wall thickening or pneumatosis to suggest source of portal venous gas in the left hepatic lobe. Unfortunately  0.91 cm AV Vmax:           269.67 cm/s AV Vmean:          175.667 cm/s AV VTI:            0.477 m AV Peak Grad:      29.1 mmHg AV Mean Grad:      16.0 mmHg LVOT Vmax:         80.17 cm/s LVOT Vmean:        50.133 cm/s LVOT VTI:          0.141 m LVOT/AV VTI ratio: 0.30 AI PHT:            547 msec  AORTA Ao Root diam: 3.10 cm Ao Asc diam:  4.40 cm  MITRAL VALVE                TRICUSPID VALVE MV Area (PHT): 3.91 cm     TR Peak grad:   51.0 mmHg MV Peak grad:  14.5 mmHg    TR Vmax:        357.00 cm/s MV Mean grad:  4.4 mmHg MV Vmax:       1.90 m/s     SHUNTS MV Vmean:      117.5 cm/s   Systemic VTI:  0.14 m MV Decel Time: 194 msec     Systemic Diam: 1.98 cm MR Peak grad: 81.0 mmHg MR Vmax:      450.00 cm/s MV E velocity: 178.00 cm/s MV A velocity: 49.10 cm/s MV E/A ratio:  3.63 Dalton McleanMD Electronically signed by Wilfred Lacy Signature Date/Time: 02/13/2023/9:46:33 AM    Final    CT Angio Abd/Pel W and/or Wo Contrast  Result Date: 02/15/2023 CLINICAL DATA:  Abdominal aortic aneurysm, preop planning EXAM: CTA ABDOMEN AND PELVIS WITHOUT AND WITH CONTRAST TECHNIQUE: Multidetector CT imaging of the abdomen and pelvis was performed using the standard protocol during bolus administration of intravenous contrast. Multiplanar reconstructed images and MIPs were obtained and reviewed to evaluate the vascular anatomy. RADIATION DOSE REDUCTION: This exam was performed according to the departmental dose-optimization program which includes automated exposure control, adjustment of the mA and/or kV according to patient size and/or use of iterative reconstruction technique. CONTRAST:  75mL OMNIPAQUE IOHEXOL 350 MG/ML SOLN COMPARISON:  None Available. FINDINGS: VASCULAR Aorta: The abdominal aorta and its branches are not well opacified likely due to heart failure as a significant portion of the contrast fills the IVC and hepatic veins. Increased size of the infrarenal abdominal aortic aneurysm now measuring 7.8 x 7.8 cm, previously 7.8 x 7.6 cm there is new hazy fat stranding about the aneurysm concerning for impending rupture. Calcified atherosclerotic plaque. Celiac: Patency is not well evaluated due to slow flow. SMA: Patency is not well evaluated due to slow flow. Renals: Patency is not well evaluated due to slow flow. IMA: Patency is not well evaluated due to  slow flow. Inflow: Patency is not well evaluated due to slow flow. Proximal Outflow: Patency is not well evaluated due to slow flow. Veins: Portal venous gas in the left hepatic lobe. Review of the MIP images confirms the above findings. NON-VASCULAR Lower chest: No acute abnormality. Hepatobiliary: Cholecystectomy. Pancreas: Unremarkable. Spleen: Unremarkable. Adrenals/Urinary Tract: Stable adrenal glands and kidneys. No obstructing urinary calculi or hydronephrosis. Unremarkable bladder. Stomach/Bowel: Normal caliber large and small bowel. Moderate colonic stool load. Colonic diverticulosis without diverticulitis. No wall thickening or pneumatosis to suggest source of portal venous gas in the left hepatic lobe. Unfortunately  DEATH SUMMARY   Patient Details  Name: [name] MRN: [id number] DOB: July 21, 1925 QMV:HQIONGEXBMWU, Campbell Lerner, MD  Admission/Discharge Information   Admit Date:  02/23/2023  Date of Death: Date of Death: 03/04/2023  Time of Death: Time of Death: 1916/03/11  Length of Stay: 9   Principle Cause of death: Abdominal Aortic Aneursym  Hospital Diagnoses: Principal Problem:   Abdominal aortic aneurysm (AAA) (HCC) Active Problems:   Shingles rash   Delirium due to multiple etiologies, acute, hyperactive   Pacemaker   End of life care   HTN (hypertension)   Longstanding persistent atrial fibrillation (HCC)   Chronic anticoagulation   Chronic diastolic heart failure (HCC)   Hyponatremia  HPI: [name] is a 89 y.o. female with medical history significant of abdominal aortic aneurysm (previously followed with Dr. Florene Route surgery: In March 11, 2021, aneurysm size was 6.2, patient had declined surgical workup and was no longer following up with vascular surgery), persistent A-fib on Pradaxa, complete heart block status post pacemaker, moderate CAD without obstructive lesions, COPD, aortic insufficiency, type a dissection of descending thoracic aorta, hypertension, hyperlipidemia presented with worsening abdominal pain.  She had presented to the ED with abdominal and back pain few days ago.  CT showed increased size of abdominal aortic aneurysm up to 7.8 cm without rupture.  Patient declined any intervention.  She also had evidence of UTI and was discharged on oral Keflex and pain meds.  Patient subsequently has changed her mind and wants to speak to vascular surgery for possible surgical intervention.  No chest pain, shortness of breath, fever, vomiting, diarrhea, dysuria, hematuria, loss of consciousness or seizures reported.   Hospital Course from 02/23/2023 thru 02-18-2023  On presentation, vascular surgery was consulted.  She underwent EVAR by vascular surgery on 02/19/2023.  Subsequently, she  developed possible shingles rash around right neck and has been started on Valtrex.  Palliative care has been involved as well.   Significant Events: Admitted 02/23/23 for abd pain 02-19-2023. Made comfort care by palliative care team. Fentanyl gtts started.  Significant Labs:   Significant Imaging Studies: Admission CTA Increased size of the infrarenal abdominal aortic aneurysm now measuring 7.8 x 7.8 cm, previously 7.8 x 7.6 cm. There is new hazy fat stranding about the aneurysm concerning for impending rupture.  Antibiotic Therapy: Anti-infectives (From admission, onward)    Start     Dose/Rate Route Frequency Ordered Stop   02/16/23 1000  valACYclovir (VALTREX) tablet 1,000 mg        1,000 mg Oral 2 times daily 02/16/23 0902 02/26/23 0959   02/16/23 0400  acyclovir (ZOVIRAX) tablet 800 mg  Status:  Discontinued        800 mg Oral 5 times daily 02/16/23 0254 02/16/23 0901   Feb 23, 2023 2200  cephALEXin (KEFLEX) capsule 500 mg  Status:  Discontinued        500 mg Oral 2 times daily 2023-02-23 1510 02/16/23 1043       Procedures: 03/07/2023 Aortobiiliac stent graft for repair of abdominal aortic aneurysm (Gore Excluder)   Consultants: Vascular Surgery Palliative [facility] Course 02-19-2023 to 2023-03-04  * Abdominal aortic aneurysm (AAA) [facility]) previously followed with Dr. Florene Route surgery: In Mar 11, 2021, aneurysm size was 6.2, patient had declined surgical workup and was no longer following up with vascular surgery -Recent CT had shown increased size of abdominal aortic aneurysm up to 7.8 cm without rupture.  Patient declined any intervention. - Patient subsequently changed her mind and now wants surgical intervention. -Repeat CT  DEATH SUMMARY   Patient Details  Name: [name] MRN: [id number] DOB: July 21, 1925 QMV:HQIONGEXBMWU, Campbell Lerner, MD  Admission/Discharge Information   Admit Date:  02/23/2023  Date of Death: Date of Death: 03/04/2023  Time of Death: Time of Death: 1916/03/11  Length of Stay: 9   Principle Cause of death: Abdominal Aortic Aneursym  Hospital Diagnoses: Principal Problem:   Abdominal aortic aneurysm (AAA) (HCC) Active Problems:   Shingles rash   Delirium due to multiple etiologies, acute, hyperactive   Pacemaker   End of life care   HTN (hypertension)   Longstanding persistent atrial fibrillation (HCC)   Chronic anticoagulation   Chronic diastolic heart failure (HCC)   Hyponatremia  HPI: [name] is a 89 y.o. female with medical history significant of abdominal aortic aneurysm (previously followed with Dr. Florene Route surgery: In March 11, 2021, aneurysm size was 6.2, patient had declined surgical workup and was no longer following up with vascular surgery), persistent A-fib on Pradaxa, complete heart block status post pacemaker, moderate CAD without obstructive lesions, COPD, aortic insufficiency, type a dissection of descending thoracic aorta, hypertension, hyperlipidemia presented with worsening abdominal pain.  She had presented to the ED with abdominal and back pain few days ago.  CT showed increased size of abdominal aortic aneurysm up to 7.8 cm without rupture.  Patient declined any intervention.  She also had evidence of UTI and was discharged on oral Keflex and pain meds.  Patient subsequently has changed her mind and wants to speak to vascular surgery for possible surgical intervention.  No chest pain, shortness of breath, fever, vomiting, diarrhea, dysuria, hematuria, loss of consciousness or seizures reported.   Hospital Course from 02/23/2023 thru 02-18-2023  On presentation, vascular surgery was consulted.  She underwent EVAR by vascular surgery on 02/19/2023.  Subsequently, she  developed possible shingles rash around right neck and has been started on Valtrex.  Palliative care has been involved as well.   Significant Events: Admitted 02/23/23 for abd pain 02-19-2023. Made comfort care by palliative care team. Fentanyl gtts started.  Significant Labs:   Significant Imaging Studies: Admission CTA Increased size of the infrarenal abdominal aortic aneurysm now measuring 7.8 x 7.8 cm, previously 7.8 x 7.6 cm. There is new hazy fat stranding about the aneurysm concerning for impending rupture.  Antibiotic Therapy: Anti-infectives (From admission, onward)    Start     Dose/Rate Route Frequency Ordered Stop   02/16/23 1000  valACYclovir (VALTREX) tablet 1,000 mg        1,000 mg Oral 2 times daily 02/16/23 0902 02/26/23 0959   02/16/23 0400  acyclovir (ZOVIRAX) tablet 800 mg  Status:  Discontinued        800 mg Oral 5 times daily 02/16/23 0254 02/16/23 0901   Feb 23, 2023 2200  cephALEXin (KEFLEX) capsule 500 mg  Status:  Discontinued        500 mg Oral 2 times daily 2023-02-23 1510 02/16/23 1043       Procedures: 03/07/2023 Aortobiiliac stent graft for repair of abdominal aortic aneurysm (Gore Excluder)   Consultants: Vascular Surgery Palliative [facility] Course 02-19-2023 to 2023-03-04  * Abdominal aortic aneurysm (AAA) [facility]) previously followed with Dr. Florene Route surgery: In Mar 11, 2021, aneurysm size was 6.2, patient had declined surgical workup and was no longer following up with vascular surgery -Recent CT had shown increased size of abdominal aortic aneurysm up to 7.8 cm without rupture.  Patient declined any intervention. - Patient subsequently changed her mind and now wants surgical intervention. -Repeat CT

## 2023-03-14 NOTE — Plan of Care (Signed)

## 2023-03-14 NOTE — Progress Notes (Signed)
Daily Progress Note   Patient Name: [name]       Date: Mar 22, 2023 DOB: [birth year]  Age: 89 y.o. MRN#: [id number] Attending Physician: Carollee Herter, DO Primary Care Physician: [name], MD Admit Date: 03/05/2023  Reason for Consultation/Follow-up: Terminal Care  Patient Profile/HPI:  89 y.o. female  with past medical history of  abdominal aortic aneurysm (previously followed with Dr. Florene Route surgery (In 2022, aneurysm size was 6.2, patient had declined surgical workup and was no longer following up with vascular surgery), persistent A-fib on Pradaxa, complete heart block status post pacemaker, moderate CAD without obstructive lesions, COPD, aortic insufficiency, type a dissection of descending thoracic aorta, hypertension, hyperlipidemia presented with worsening abdominal pain. She had presented to the ED with abdominal and back pain few days ago. CT showed increased size of abdominal aortic aneurysm up to 7.8 cm without rupture. Patient declined any intervention. She also had evidence of UTI and was discharged on oral Keflex and pain meds. Patient subsequently has changed her mind and wanted to speak to vascular surgery for possible surgical intervention.  On presentation, vascular surgery was consulted.  She underwent EVAR for symptomatic AAA. She has had significant decline since her surgery and was transitioned to full comfort measures only.   Subjective: Chart reviewed including labs, progress notes, imaging from this and previous encounters.  Patient unresponsive, several sons at bedside.  Secretions better today. [facility], no beds available.  Review of Systems  Unable to perform ROS: Acuity of condition      Physical Exam Vitals and nursing note reviewed.  Constitutional:      Appearance: She is ill-appearing.  Pulmonary:     Effort: Pulmonary effort is normal.     Comments: Terminal secretions Neurological:     Comments: unresponsive             Vital Signs: BP 127/63 (BP Location: Left Arm)   Pulse 70   Temp 100.3 F (37.9 C) (Axillary)   Resp 16   Ht 5\' 2"  (1.575 m)   Wt 57.7 kg   SpO2 92%   BMI 23.27 kg/m  SpO2: SpO2: 92 % O2 Device: O2 Device: Nasal Cannula O2 Flow Rate: O2 Flow Rate (L/min): 2 L/min  Intake/output summary:  Intake/Output Summary (Last 24 hours)  at 02/17/2023 1537 Last data filed at 02/20/2023 0437 Gross per 24 hour  Intake 0 ml  Output 570 ml  Net -570 ml   LBM: Last BM Date : 02/20/23 Baseline Weight: Weight: 53.1 kg Most recent weight: Weight: 57.7 kg       Palliative Assessment/Data: PPS: 10%      Patient Active Problem List   Diagnosis Date Noted   End of life care 02/20/2023   Shingles rash 02/19/2023   Delirium due to multiple etiologies, acute, hyperactive 02/19/2023   Hyponatremia 02/19/2023   Abdominal aortic aneurysm (AAA) (HCC) 2023/02/28   Dizziness 10/12/2021   Pacemaker battery depletion 04/16/2021   Ascending cholangitis    Choledocholithiasis with obstruction 02/06/2021   Pain in the abdomen 02/05/2021   Elevated LFTs    Transaminitis    History of gallstones    AAA (abdominal aortic aneurysm) (HCC) 02/04/2021   Elevated liver enzymes 02/04/2021   Nonrheumatic aortic valve insufficiency 04/24/2018   Chronic diastolic heart failure (HCC) 04/24/2018   Renovascular hypertension 04/24/2018   Ascending aortic aneurysm (HCC) 04/24/2018   Dissection of descending thoracic aorta (HCC) 04/24/2018   Hyperkalemia 04/24/2018   Aortic aneurysm (HCC) 10/23/2017   Nonrheumatic aortic (valve) stenosis 06/25/2017   Complete heart block (HCC) 01/23/2016   Chronic anticoagulation 05/04/2015   Abnormal myocardial perfusion  study    Hypertensive heart disease without heart failure    Coronary artery disease involving native coronary artery of native heart without angina pectoris    Chest pain 04/19/2015   AAA (abdominal aortic aneurysm) without rupture (HCC)    Pelvic fracture (HCC) 09/16/2014   Fracture of left superior pubic ramus (HCC) 09/14/2014   Contusion of forehead 09/14/2014   Calculus of bile duct 06/23/2013   Pacemaker 12/07/2012   Hypercholesterolemia 12/07/2012   CVA (cerebral vascular accident) (HCC) 01/22/2012   Near syncope 01/17/2012   Longstanding persistent atrial fibrillation (HCC) 01/05/2012   CAD, moderate 09/05/2011   COPD with acute exacerbation (HCC) 09/05/2011   HTN (hypertension) 09/03/2011   Dyspnea on exertion, secondary to AF 09/03/2011   RBBB, intermittent 09/03/2011   History of colonic polyps 06/21/2011   Right bundle branch block with left anterior fascicular block 03/19/2011   Allergic rhinitis due to pollen 07/17/2010   GERD 10/25/2009   ABDOMINAL PAIN-EPIGASTRIC 10/25/2009   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  Thank you for allowing the Palliative Medicine Team to assist in the care of this patient.   Ocie Bob, AGNP-C Palliative Medicine   Please contact Palliative Medicine Team phone at [phone] for questions and concerns.

## 2023-03-14 NOTE — Progress Notes (Addendum)
   This pt has been approved by our MD for hospice care at the [facility] facility in [facility]. Unfortunately we continue to not have a bed to offer due to capacity. If this changes I will plan to speak with son's and evaluate if pt is stable enough to move. Or make the transfer.   Norm Parcel RN [phone]

## 2023-03-14 NOTE — Progress Notes (Addendum)
Pt pronounced at 1917. MD notified and [facility] called.  IV Fentanyl wasted and verified with another nurse and placed in steri cycle.    Karna Christmas Estil Vallee

## 2023-03-14 DEATH — deceased

## 2023-03-18 ENCOUNTER — Encounter: Payer: Medicare Other | Admitting: Vascular Surgery

## 2023-07-24 ENCOUNTER — Encounter: Payer: Medicare Other | Admitting: Cardiovascular Disease
